# Patient Record
Sex: Male | Born: 1975
Health system: Southern US, Community
[De-identification: ages and names within clinical notes are randomized; demographics above are authoritative.]

## PROBLEM LIST (undated history)

## (undated) ENCOUNTER — Emergency Department (HOSPITAL_COMMUNITY): Discharge status: Absent without leave | Payer: MEDICAID

## (undated) ENCOUNTER — Emergency Department (HOSPITAL_COMMUNITY): Admission: EM | Payer: Medicare Other

## (undated) ENCOUNTER — Emergency Department (HOSPITAL_COMMUNITY): Payer: Medicaid Other

## (undated) ENCOUNTER — Emergency Department (HOSPITAL_COMMUNITY): Admission: EM | Payer: Medicaid Other | Source: Home / Self Care

## (undated) DIAGNOSIS — N189 Chronic kidney disease, unspecified: Secondary | ICD-10-CM

## (undated) DIAGNOSIS — D649 Anemia, unspecified: Secondary | ICD-10-CM

## (undated) DIAGNOSIS — I82409 Acute embolism and thrombosis of unspecified deep veins of unspecified lower extremity: Secondary | ICD-10-CM

## (undated) DIAGNOSIS — M21371 Foot drop, right foot: Secondary | ICD-10-CM

## (undated) DIAGNOSIS — Z932 Ileostomy status: Secondary | ICD-10-CM

## (undated) DIAGNOSIS — G8921 Chronic pain due to trauma: Secondary | ICD-10-CM

## (undated) DIAGNOSIS — Z87442 Personal history of urinary calculi: Secondary | ICD-10-CM

## (undated) DIAGNOSIS — W3400XA Accidental discharge from unspecified firearms or gun, initial encounter: Secondary | ICD-10-CM

## (undated) HISTORY — DX: Foot drop, right foot: M21.371

## (undated) HISTORY — DX: Ileostomy status: Z93.2

## (undated) HISTORY — DX: Chronic pain due to trauma: G89.21

---

## 2002-03-06 ENCOUNTER — Emergency Department (HOSPITAL_COMMUNITY): Admission: EM | Admit: 2002-03-06 | Discharge: 2002-03-06 | Payer: Self-pay | Admitting: Emergency Medicine

## 2004-07-11 ENCOUNTER — Emergency Department (HOSPITAL_COMMUNITY): Admission: EM | Admit: 2004-07-11 | Discharge: 2004-07-11 | Payer: Self-pay | Admitting: Emergency Medicine

## 2005-06-10 ENCOUNTER — Emergency Department (HOSPITAL_COMMUNITY): Admission: EM | Admit: 2005-06-10 | Discharge: 2005-06-10 | Payer: Self-pay | Admitting: Emergency Medicine

## 2005-08-05 ENCOUNTER — Emergency Department (HOSPITAL_COMMUNITY): Admission: EM | Admit: 2005-08-05 | Discharge: 2005-08-05 | Payer: Self-pay | Admitting: Emergency Medicine

## 2005-09-16 ENCOUNTER — Ambulatory Visit (HOSPITAL_COMMUNITY): Admission: RE | Admit: 2005-09-16 | Discharge: 2005-09-16 | Payer: Self-pay | Admitting: General Surgery

## 2005-09-16 ENCOUNTER — Encounter (INDEPENDENT_AMBULATORY_CARE_PROVIDER_SITE_OTHER): Payer: Self-pay | Admitting: *Deleted

## 2007-04-11 ENCOUNTER — Emergency Department (HOSPITAL_COMMUNITY): Admission: EM | Admit: 2007-04-11 | Discharge: 2007-04-12 | Payer: Self-pay | Admitting: Emergency Medicine

## 2008-11-29 ENCOUNTER — Emergency Department (HOSPITAL_COMMUNITY): Admission: EM | Admit: 2008-11-29 | Discharge: 2008-11-29 | Payer: Self-pay | Admitting: Emergency Medicine

## 2009-02-28 ENCOUNTER — Emergency Department (HOSPITAL_COMMUNITY): Admission: EM | Admit: 2009-02-28 | Discharge: 2009-02-28 | Payer: Self-pay | Admitting: Emergency Medicine

## 2010-06-29 ENCOUNTER — Emergency Department (HOSPITAL_COMMUNITY)
Admission: EM | Admit: 2010-06-29 | Discharge: 2010-06-29 | Payer: Self-pay | Source: Home / Self Care | Admitting: Family Medicine

## 2010-10-27 LAB — RAPID STREP SCREEN (MED CTR MEBANE ONLY): Streptococcus, Group A Screen (Direct): NEGATIVE

## 2010-10-30 LAB — URINALYSIS, ROUTINE W REFLEX MICROSCOPIC
Bilirubin Urine: NEGATIVE
Glucose, UA: NEGATIVE mg/dL
Hgb urine dipstick: NEGATIVE
Ketones, ur: NEGATIVE mg/dL
Nitrite: NEGATIVE
Protein, ur: NEGATIVE mg/dL
Specific Gravity, Urine: 1.009 (ref 1.005–1.030)
Urobilinogen, UA: 1 mg/dL (ref 0.0–1.0)
pH: 6.5 (ref 5.0–8.0)

## 2010-12-07 NOTE — Op Note (Signed)
Raymond Bowen, Raymond Bowen                ACCOUNT NO.:  [id number]   MEDICAL RECORD NO.:  [id number]          PATIENT TYPE:  AMB   LOCATION:  DAY                          FACILITY:  [facility]   PHYSICIAN:  Ollen Gross. Vernell Morgans, M.D. DATE OF BIRTH:  1975/07/30   DATE OF PROCEDURE:  09/16/2005  DATE OF DISCHARGE:                                 OPERATIVE REPORT   PREOPERATIVE DIAGNOSIS:  Lipoma on the posterior neck.   POSTOPERATIVE DIAGNOSIS:  Lipoma on the posterior neck.   PROCEDURE:  Excision of lipoma from the posterior neck.   SURGEON:  Ollen Gross. Carolynne Edouard, M.D.   ANESTHESIA:  General endotracheal.   PROCEDURE:  After informed consent was obtained, the patient was brought to  the operating room and left in the supine position on the stretcher.  After  adequate induction of general anesthesia, the patient was moved into a prone  position on the operating room table, and all pressure points were padded.  The posterior neck had a moderate-sized lipoma that was symptomatic.  This  area was prepped with Hibiclens and draped in the usual sterile manner.  The  area in question was then infiltrated with 0.25% Marcaine with epinephrine.  A small transverse incision was made overlying the palpable mass.  This  incision was carried down through the skin and into the subcutaneous tissue  sharply with a 15 blade knife.  The lipoma was then excised from the rest of  the subcutaneous tissue using a combination of blunt hemostat dissection and  sharp dissection with the electrocautery.  Once the lipoma had been  completely removed by this circumferential dissection, he was sent to  pathology for further evaluation.  The wound was then examined, and  hemostasis was achieved using the electrocautery.  No other evidence of  lipomatous tissue was appreciable.  The wound was then closed with  interrupted 4-0 Monocryl subcuticular stitches and Dermabond dressing was  applied.  The patient tolerated the procedure well.   At the end of the case,  all needle and sponge counts were correct.  Patient was then moved back into  a supine position on the stretcher, awakened, and taken to the recovery room  in stable condition.      Ollen Gross. Vernell Morgans, M.D.  Electronically Signed     PST/MEDQ  D:  09/16/2005  T:  09/16/2005  Job:  [id number]

## 2012-03-17 ENCOUNTER — Emergency Department (HOSPITAL_COMMUNITY)
Admission: EM | Admit: 2012-03-17 | Discharge: 2012-03-17 | Disposition: A | Discharge status: Home / Self Care | Payer: Self-pay | Attending: Emergency Medicine | Admitting: Emergency Medicine

## 2012-03-17 ENCOUNTER — Encounter (HOSPITAL_COMMUNITY): Payer: Self-pay | Admitting: Family Medicine

## 2012-03-17 DIAGNOSIS — K047 Periapical abscess without sinus: Secondary | ICD-10-CM | POA: Insufficient documentation

## 2012-03-17 DIAGNOSIS — F17200 Nicotine dependence, unspecified, uncomplicated: Secondary | ICD-10-CM | POA: Insufficient documentation

## 2012-03-17 MED ORDER — OXYCODONE-ACETAMINOPHEN 5-325 MG PO TABS: 1.0000 | ORAL_TABLET | ORAL | Status: AC | PRN

## 2012-03-17 MED ORDER — CLINDAMYCIN HCL 150 MG PO CAPS: 450.0000 mg | ORAL_CAPSULE | Freq: Once | ORAL | Status: DC

## 2012-03-17 MED ORDER — OXYCODONE-ACETAMINOPHEN 5-325 MG PO TABS
1.0000 | ORAL_TABLET | Freq: Once | ORAL | Status: AC
  Administered 2012-03-17: 1 via ORAL
  Filled 2012-03-17: qty 1

## 2012-03-17 MED ORDER — CLINDAMYCIN HCL 300 MG PO CAPS
450.0000 mg | ORAL_CAPSULE | Freq: Once | ORAL | Status: AC
  Administered 2012-03-17: 450 mg via ORAL
  Filled 2012-03-17: qty 1

## 2012-03-17 MED ORDER — CLINDAMYCIN HCL 300 MG PO CAPS: 300.0000 mg | ORAL_CAPSULE | Freq: Three times a day (TID) | ORAL | Status: AC

## 2012-03-17 MED ORDER — OXYCODONE-ACETAMINOPHEN 5-325 MG PO TABS: 1.0000 | ORAL_TABLET | ORAL | Status: DC | PRN

## 2012-03-17 NOTE — ED Notes (Signed)
Pt. Reports taking "one percocet at 7:20 this morning".

## 2012-03-17 NOTE — ED Notes (Signed)
Pt. C/o facial swelling on right side.  Pt. C/o tooth pain x2 days and reports "spitting up blood this morning".  Pt. Reports pain level as 10/10.  Alert and oriented x4.
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
## 2012-03-19 NOTE — ED Provider Notes (Signed)
I saw and evaluated the patient, reviewed the resident's note and I agree with the findings and plan.  Cyndra Numbers, MD 03/19/12 [phone]

## 2013-03-22 ENCOUNTER — Emergency Department (HOSPITAL_COMMUNITY): Payer: Self-pay

## 2013-03-22 ENCOUNTER — Encounter (HOSPITAL_COMMUNITY): Payer: Self-pay | Admitting: Radiology

## 2013-03-22 ENCOUNTER — Emergency Department (HOSPITAL_COMMUNITY)
Admission: EM | Admit: 2013-03-22 | Discharge: 2013-03-22 | Disposition: A | Discharge status: Home / Self Care | Payer: Self-pay | Attending: Emergency Medicine | Admitting: Emergency Medicine

## 2013-03-22 DIAGNOSIS — S022XXA Fracture of nasal bones, initial encounter for closed fracture: Secondary | ICD-10-CM | POA: Insufficient documentation

## 2013-03-22 DIAGNOSIS — S02109A Fracture of base of skull, unspecified side, initial encounter for closed fracture: Secondary | ICD-10-CM | POA: Insufficient documentation

## 2013-03-22 DIAGNOSIS — S01409A Unspecified open wound of unspecified cheek and temporomandibular area, initial encounter: Secondary | ICD-10-CM | POA: Insufficient documentation

## 2013-03-22 DIAGNOSIS — S0292XA Unspecified fracture of facial bones, initial encounter for closed fracture: Secondary | ICD-10-CM

## 2013-03-22 DIAGNOSIS — S01501A Unspecified open wound of lip, initial encounter: Secondary | ICD-10-CM | POA: Insufficient documentation

## 2013-03-22 DIAGNOSIS — F17200 Nicotine dependence, unspecified, uncomplicated: Secondary | ICD-10-CM | POA: Insufficient documentation

## 2013-03-22 DIAGNOSIS — F1010 Alcohol abuse, uncomplicated: Secondary | ICD-10-CM | POA: Insufficient documentation

## 2013-03-22 DIAGNOSIS — R4182 Altered mental status, unspecified: Secondary | ICD-10-CM | POA: Insufficient documentation

## 2013-03-22 DIAGNOSIS — F10929 Alcohol use, unspecified with intoxication, unspecified: Secondary | ICD-10-CM

## 2013-03-22 LAB — COMPREHENSIVE METABOLIC PANEL
ALT: 15 U/L (ref 0–53)
AST: 31 U/L (ref 0–37)
Albumin: 4 g/dL (ref 3.5–5.2)
Alkaline Phosphatase: 72 U/L (ref 39–117)
BUN: 14 mg/dL (ref 6–23)
CO2: 20 mEq/L (ref 19–32)
Calcium: 9.2 mg/dL (ref 8.4–10.5)
Chloride: 103 mEq/L (ref 96–112)
Creatinine, Ser: 1.32 mg/dL (ref 0.50–1.35)
GFR calc Af Amer: 80 mL/min — ABNORMAL LOW (ref >90)
GFR calc non Af Amer: 69 mL/min — ABNORMAL LOW (ref >90)
Glucose, Bld: 122 mg/dL — ABNORMAL HIGH (ref 70–99)
Potassium: 3.5 mEq/L (ref 3.5–5.1)
Sodium: 140 mEq/L (ref 135–145)
Total Bilirubin: 0.4 mg/dL (ref 0.3–1.2)
Total Protein: 8 g/dL (ref 6.0–8.3)

## 2013-03-22 LAB — POCT I-STAT, CHEM 8
BUN: 13 mg/dL (ref 6–23)
Calcium, Ion: 1.06 mmol/L — ABNORMAL LOW (ref 1.12–1.23)
Chloride: 110 mEq/L (ref 96–112)
Creatinine, Ser: 1.7 mg/dL — ABNORMAL HIGH (ref 0.50–1.35)
Glucose, Bld: 119 mg/dL — ABNORMAL HIGH (ref 70–99)
HCT: 44 % (ref 39.0–52.0)
Hemoglobin: 15 g/dL (ref 13.0–17.0)
Potassium: 3.7 mEq/L (ref 3.5–5.1)
Sodium: 145 mEq/L (ref 135–145)
TCO2: 20 mmol/L (ref 0–100)

## 2013-03-22 LAB — CBC WITH DIFFERENTIAL/PLATELET
Basophils Absolute: 0 10*3/uL (ref 0.0–0.1)
Basophils Relative: 0 % (ref 0–1)
Eosinophils Absolute: 0.1 10*3/uL (ref 0.0–0.7)
Eosinophils Relative: 1 % (ref 0–5)
HCT: 40 % (ref 39.0–52.0)
Hemoglobin: 14.3 g/dL (ref 13.0–17.0)
Lymphocytes Relative: 17 % (ref 12–46)
Lymphs Abs: 2.3 10*3/uL (ref 0.7–4.0)
MCH: 34 pg (ref 26.0–34.0)
MCHC: 35.8 g/dL (ref 30.0–36.0)
MCV: 95.2 fL (ref 78.0–100.0)
Monocytes Absolute: 1.1 10*3/uL — ABNORMAL HIGH (ref 0.1–1.0)
Monocytes Relative: 8 % (ref 3–12)
Neutro Abs: 10.1 10*3/uL — ABNORMAL HIGH (ref 1.7–7.7)
Neutrophils Relative %: 74 % (ref 43–77)
Platelets: 227 10*3/uL (ref 150–400)
RBC: 4.2 MIL/uL — ABNORMAL LOW (ref 4.22–5.81)
RDW: 14.6 % (ref 11.5–15.5)
WBC: 13.6 10*3/uL — ABNORMAL HIGH (ref 4.0–10.5)

## 2013-03-22 LAB — ETHANOL: Alcohol, Ethyl (B): 177 mg/dL — ABNORMAL HIGH (ref 0–11)

## 2013-03-22 LAB — GLUCOSE, CAPILLARY: Glucose-Capillary: 117 mg/dL — ABNORMAL HIGH (ref 70–99)

## 2013-03-22 MED ORDER — LORAZEPAM 2 MG/ML IJ SOLN
1.0000 mg | Freq: Once | INTRAMUSCULAR | Status: AC
  Administered 2013-03-22: 1 mg via INTRAVENOUS
  Filled 2013-03-22: qty 1

## 2013-03-22 MED ORDER — HYDROCODONE-ACETAMINOPHEN 5-325 MG PO TABS: 1.0000 | ORAL_TABLET | Freq: Four times a day (QID) | ORAL | Status: DC | PRN

## 2013-03-22 MED ORDER — AMPICILLIN 500 MG PO CAPS: 500.0000 mg | ORAL_CAPSULE | Freq: Four times a day (QID) | ORAL | Status: DC

## 2013-03-22 MED ORDER — HYDROMORPHONE HCL PF 1 MG/ML IJ SOLN
0.5000 mg | Freq: Once | INTRAMUSCULAR | Status: AC
  Administered 2013-03-22: 0.5 mg via INTRAVENOUS
  Filled 2013-03-22: qty 1

## 2013-03-22 MED ORDER — SODIUM CHLORIDE 0.9 % IV SOLN
Freq: Once | INTRAVENOUS | Status: AC
  Administered 2013-03-22: via INTRAVENOUS

## 2013-03-22 MED ORDER — CEFAZOLIN SODIUM 1-5 GM-% IV SOLN
1.0000 g | Freq: Three times a day (TID) | INTRAVENOUS | Status: DC
  Administered 2013-03-22: 1 g via INTRAVENOUS
  Filled 2013-03-22: qty 50

## 2013-03-22 MED ORDER — TETANUS-DIPHTH-ACELL PERTUSSIS 5-2.5-18.5 LF-MCG/0.5 IM SUSP
0.5000 mL | Freq: Once | INTRAMUSCULAR | Status: AC
  Administered 2013-03-22: 0.5 mL via INTRAMUSCULAR
  Filled 2013-03-22: qty 0.5

## 2013-03-22 MED ORDER — LORAZEPAM 2 MG/ML IJ SOLN
1.0000 mg | Freq: Once | INTRAMUSCULAR | Status: AC
  Administered 2013-03-22: 1 mg via INTRAVENOUS

## 2013-03-22 MED ORDER — ONDANSETRON HCL 4 MG/2ML IJ SOLN
4.0000 mg | Freq: Once | INTRAMUSCULAR | Status: AC
  Administered 2013-03-22: 4 mg via INTRAVENOUS

## 2013-03-22 MED ORDER — ONDANSETRON HCL 4 MG/2ML IJ SOLN
INTRAMUSCULAR | Status: AC
  Filled 2013-03-22: qty 2
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
## 2013-03-22 NOTE — ED Notes (Signed)
Pt given a cup of ice with 2 apple juices, a pair of blue scrubs and a pair of non-slid socks; pt has been d/c'd from IV, monitor, continuous pulse oximetry and blood pressure cuff; wheelchair has also been provided to take pt to waiting area to wait for his ride

## 2013-03-22 NOTE — ED Notes (Addendum)
Pt spontaneously arousable from sleep, moaning, VSS, unsafe movements over rail, against rail, towards edge of bed, not following commands, speech difficult to discern, guided back to matress, pt states, "what are you doing" and fell back to sleep, EDP aware, pain med ordered.
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
## 2013-03-22 NOTE — ED Notes (Signed)
Pt in CT scanner. Uncooperative. MAEx4, rolling towards edges of table. Dr. Deretha Emory aware.

## 2013-03-22 NOTE — ED Notes (Signed)
Pain med given, abx infusing, pt sleeping.

## 2013-03-22 NOTE — ED Notes (Signed)
[employer] present outside of room 

## 2013-03-22 NOTE — ED Notes (Signed)
No changes, pt sleeping
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
## 2013-03-22 NOTE — ED Notes (Signed)
[employer] present outside of room

## 2013-03-22 NOTE — ED Notes (Signed)
Back to sleep, pain med given, sonorous resps, VSS.

## 2013-03-22 NOTE — ED Notes (Signed)
Pt to CT

## 2013-03-22 NOTE — ED Provider Notes (Signed)
8:41 AM Patient awake and alert, no new complaints.  Vital signs remained stable.  He will be discharged.  Gerhard Munch, MD 03/22/13 [phone]

## 2013-03-22 NOTE — ED Notes (Signed)
Dr. Deretha Emory in CT scanner room.

## 2013-03-22 NOTE — ED Notes (Signed)
Ativan 1 mg given (2nd dose) given per t.o. Dr. Deretha Emory, VSS. Pt argumentative, resisting care.

## 2013-03-22 NOTE — ED Notes (Signed)
Continuing to wait for sobriety.  Pt sleeping with sonorous resps, intermitantly spontaneously arousable, VSS. c-collar remains.

## 2013-03-22 NOTE — ED Notes (Signed)
cbg 117 

## 2013-03-22 NOTE — ED Notes (Signed)
Warm blanket given, ambient room temp increased.

## 2013-03-22 NOTE — ED Notes (Signed)
Pt given PO fluids. Trying to call for ride home.

## 2013-03-22 NOTE — ED Notes (Signed)
No changes, remains sleeping/ sedated. No restlessness or aggitation at this time. Sonorous resps. VSS.
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
## 2013-03-22 NOTE — ED Notes (Signed)
Lab at [facility], blood obtained. 

## 2013-03-22 NOTE — ED Notes (Signed)
Pt sleeping when rounded on. C-collar remains in place.  Cardiac monitoring continues.  No change in pt condition.

## 2013-03-22 NOTE — ED Notes (Signed)
Scans continue, pt still at this time.

## 2013-03-22 NOTE — ED Notes (Signed)
Continues to sleep sedated, sonorous resps, VSS, no changes.

## 2013-03-22 NOTE — ED Notes (Signed)
Xray at [facility], pt remains alert, NAD, calm, interactive, resps e/u, speaking in clearly, semi cooperative, not following all commands, answers most questions appropriately. Continues to MAEx4. c-collar remains. Lying flat supine.

## 2013-03-22 NOTE — ED Notes (Signed)
MD at bedside. 

## 2013-03-22 NOTE — ED Notes (Signed)
arousable spontaneously, trying to get out of bed, re-oriented, redirected, moaning, EDP aware.

## 2013-03-22 NOTE — ED Notes (Signed)
Sl;eeping, no changes, VSS.
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
## 2013-03-23 ENCOUNTER — Emergency Department (HOSPITAL_COMMUNITY)
Admission: EM | Admit: 2013-03-23 | Discharge: 2013-03-23 | Disposition: A | Discharge status: Home / Self Care | Payer: Self-pay | Attending: Emergency Medicine | Admitting: Emergency Medicine

## 2013-03-23 ENCOUNTER — Encounter (HOSPITAL_COMMUNITY): Payer: Self-pay | Admitting: *Deleted

## 2013-03-23 DIAGNOSIS — R220 Localized swelling, mass and lump, head: Secondary | ICD-10-CM | POA: Insufficient documentation

## 2013-03-23 DIAGNOSIS — G8911 Acute pain due to trauma: Secondary | ICD-10-CM | POA: Insufficient documentation

## 2013-03-23 DIAGNOSIS — S02401A Maxillary fracture, unspecified, initial encounter for closed fracture: Secondary | ICD-10-CM

## 2013-03-23 DIAGNOSIS — S022XXD Fracture of nasal bones, subsequent encounter for fracture with routine healing: Secondary | ICD-10-CM

## 2013-03-23 DIAGNOSIS — F17200 Nicotine dependence, unspecified, uncomplicated: Secondary | ICD-10-CM | POA: Insufficient documentation

## 2013-03-23 DIAGNOSIS — R221 Localized swelling, mass and lump, neck: Secondary | ICD-10-CM | POA: Insufficient documentation
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
## 2013-03-23 NOTE — ED Notes (Signed)
Back wounds irrigated, applied bacitracin and non-adherent abd pads. Pt provided cleaning instructions and s/s infection.
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
## 2013-08-23 ENCOUNTER — Encounter (HOSPITAL_COMMUNITY): Payer: Self-pay | Admitting: Emergency Medicine

## 2013-08-23 ENCOUNTER — Emergency Department (HOSPITAL_COMMUNITY)
Admission: EM | Admit: 2013-08-23 | Discharge: 2013-08-23 | Disposition: A | Discharge status: Home / Self Care | Payer: Self-pay | Attending: Emergency Medicine | Admitting: Emergency Medicine

## 2013-08-23 DIAGNOSIS — F17200 Nicotine dependence, unspecified, uncomplicated: Secondary | ICD-10-CM | POA: Insufficient documentation

## 2013-08-23 DIAGNOSIS — N489 Disorder of penis, unspecified: Secondary | ICD-10-CM | POA: Insufficient documentation

## 2013-08-23 DIAGNOSIS — B86 Scabies: Secondary | ICD-10-CM | POA: Insufficient documentation

## 2013-08-23 DIAGNOSIS — R21 Rash and other nonspecific skin eruption: Secondary | ICD-10-CM

## 2013-08-23 MED ORDER — AZITHROMYCIN 250 MG PO TABS
1000.0000 mg | ORAL_TABLET | Freq: Once | ORAL | Status: AC
  Administered 2013-08-23: 1000 mg via ORAL
  Filled 2013-08-23: qty 4

## 2013-08-23 MED ORDER — PERMETHRIN 5 % EX CREA: TOPICAL_CREAM | CUTANEOUS | Status: DC

## 2013-08-23 MED ORDER — CEFTRIAXONE SODIUM 250 MG IJ SOLR
250.0000 mg | Freq: Once | INTRAMUSCULAR | Status: AC
  Administered 2013-08-23: 250 mg via INTRAMUSCULAR
  Filled 2013-08-23: qty 250

## 2013-08-23 MED ORDER — PENICILLIN V POTASSIUM 500 MG PO TABS: 500.0000 mg | ORAL_TABLET | Freq: Three times a day (TID) | ORAL | Status: DC

## 2013-08-23 MED ORDER — METRONIDAZOLE 500 MG PO TABS
2000.0000 mg | ORAL_TABLET | Freq: Once | ORAL | Status: AC
  Administered 2013-08-23: 2000 mg via ORAL
  Filled 2013-08-23: qty 4

## 2013-08-23 NOTE — ED Provider Notes (Signed)
CSN: [id number]     Arrival date & time 08/23/13  1228 History   None   This chart was scribed for Marlon Pel PA-C, a non-physician practitioner working with Geoffery Lyons, MD by Lewanda Rife, ED Scribe. This patient was seen in room TR06C/TR06C and the patient's care was started at 2:45 PM      Chief Complaint  Patient presents with  . Rash   (Consider location/radiation/quality/duration/timing/severity/associated sxs/prior Treatment) The history is provided by the patient. No language interpreter was used.  HPI Comments: Raymond Bowen is a 38 y.o. male who presents to the Emergency Department complaining of worsening, constant rash onset 2 weeks. Describes rash as severely pruritic, moderately painful and "all over." States the rash is worse in his groin and buttocks. Reports symptoms are exacerbated at night. Denies any alleviating factors. Denies associated fever, known allergies, change in laundry detergent, change in soaps, and change in medications.  History reviewed. No pertinent past medical history. History reviewed. No pertinent past surgical history. No family history on file. History  Substance Use Topics  . Smoking status: Current Every Day Smoker  . Smokeless tobacco: Not on file  . Alcohol Use: Yes    Review of Systems  Constitutional: Negative for fatigue.  Skin: Positive for rash.  Psychiatric/Behavioral: Negative for confusion.    Allergies  Shellfish allergy  Home Medications   Current Outpatient Rx  Name  Route  Sig  Dispense  Refill  . permethrin (ELIMITE) 5 % cream      Apply to affected area once   60 g   1    BP 156/79  Pulse 74  Temp(Src) 98.2 F (36.8 C)  Resp 16  SpO2 97% Physical Exam  Nursing note and vitals reviewed. Constitutional: He is oriented to person, place, and time. He appears well-developed and well-nourished. No distress.  HENT:  Head: Normocephalic and atraumatic.  Eyes: EOM are normal.  Neck: Neck supple. No  tracheal deviation present.  Cardiovascular: Normal rate.   Pulmonary/Chest: Effort normal. No respiratory distress.  Genitourinary:  excoriation and rash to penis.  There is no drainage, gumma, chancre   Musculoskeletal: Normal range of motion.  Neurological: He is alert and oriented to person, place, and time.  Skin: Skin is warm and dry. Rash noted.  Psychiatric: He has a normal mood and affect. His behavior is normal.    ED Course  Procedures COORDINATION OF CARE:  Nursing notes reviewed. Vital signs reviewed. Initial pt interview and examination performed.   2:50 PM-Discussed treatment plan with pt at bedside. Pt agrees with plan.   Treatment plan initiated: Medications  azithromycin (ZITHROMAX) tablet 1,000 mg (not administered)  cefTRIAXone (ROCEPHIN) injection 250 mg (not administered)  metroNIDAZOLE (FLAGYL) tablet 2,000 mg (not administered)     Initial diagnostic testing ordered.     Labs Review Labs Reviewed - No data to display Imaging Review No results found.  EKG Interpretation   None       MDM   1. Scabies   2. Penile rash    Symptoms do not seem consistent with STD, however, patient has rash to penis which is most likely the scabies that has been scratched and is secondarily infected.  Pt told to come back if treatments don't help.  Given in ED, Azithromycin, Rocephin and Flagyl. Rx: Promethrin  37 y.o.[name]'s evaluation in the Emergency Department is complete. It has been determined that no acute conditions requiring further emergency intervention are present at this  time. The patient/guardian have been advised of the diagnosis and plan. We have discussed signs and symptoms that warrant return to the ED, such as changes or worsening in symptoms.  Vital signs are stable at discharge. Filed Vitals:   08/23/13 1256  BP: 156/79  Pulse: 74  Temp: 98.2 F (36.8 C)  Resp: 16    Patient/guardian has voiced understanding and agreed  to follow-up with the PCP or specialist.  I personally performed the services described in this documentation, which was scribed in my presence. The recorded information has been reviewed and is accurate. Dorthula Matas.    Ronan Duecker G Paige Monarrez, PA-C 08/23/13 1504

## 2013-08-23 NOTE — Discharge Instructions (Signed)
Scabies Scabies are small bugs (mites) that burrow under the skin and cause red bumps and severe itching. These bugs can only be seen with a microscope. Scabies are highly contagious. They can spread easily from person to person by direct contact. They are also spread through sharing clothing or linens that have the scabies mites living in them. It is not unusual for an entire family to become infected through shared towels, clothing, or bedding.  HOME CARE INSTRUCTIONS   Your caregiver may prescribe a cream or lotion to kill the mites. If cream is prescribed, massage the cream into the entire body from the neck to the bottom of both feet. Also massage the cream into the scalp and face if your child is less than 36 year old. Avoid the eyes and mouth. Do not wash your hands after application.  Leave the cream on for 8 to 12 hours. Your child should bathe or shower after the 8 to 12 hour application period. Sometimes it is helpful to apply the cream to your child right before bedtime.  One treatment is usually effective and will eliminate approximately 95% of infestations. For severe cases, your caregiver may decide to repeat the treatment in 1 week. Everyone in your household should be treated with one application of the cream.  New rashes or burrows should not appear within 24 to 48 hours after successful treatment. However, the itching and rash may last for 2 to 4 weeks after successful treatment. Your caregiver may prescribe a medicine to help with the itching or to help the rash go away more quickly.  Scabies can live on clothing or linens for up to 3 days. All of your child's recently used clothing, towels, stuffed toys, and bed linens should be washed in hot water and then dried in a dryer for at least 20 minutes on high heat. Items that cannot be washed should be enclosed in a plastic bag for at least 3 days.  To help relieve itching, bathe your child in a cool bath or apply cool washcloths to the  affected areas.  Your child may return to school after treatment with the prescribed cream. SEEK MEDICAL CARE IF:   The itching persists longer than 4 weeks after treatment.  The rash spreads or becomes infected. Signs of infection include red blisters or yellow-tan crust. Document Released: 07/08/2005 Document Revised: 09/30/2011 Document Reviewed: 11/16/2008 [facility] Patient Information 2014 [place], [place]. Sexually Transmitted Disease A sexually transmitted disease (STD) is a disease or infection that may be passed (transmitted) from person to person, usually during sexual activity. This may happen by way of saliva, semen, blood, vaginal mucus, or urine. Common STDs include:   Gonorrhea.   Chlamydia.   Syphilis.   HIV and AIDS.   Genital herpes.   Hepatitis B and C.   Trichomonas.   Human papillomavirus (HPV).   Pubic lice.   Scabies.  Mites.  Bacterial vaginosis. WHAT ARE CAUSES OF STDs? An STD may be caused by bacteria, a virus, or parasites. STDs are often transmitted during sexual activity if one person is infected. However, they may also be transmitted through nonsexual means. STDs may be transmitted after:   Sexual intercourse with an infected person.   Sharing sex toys with an infected person.   Sharing needles with an infected person or using unclean piercing or tattoo needles.  Having intimate contact with the genitals, mouth, or rectal areas of an infected person.   Exposure to infected fluids during birth. WHAT  ARE THE SIGNS AND SYMPTOMS OF STDs? Different STDs have different symptoms. Some people may not have any symptoms. If symptoms are present, they may include:   Painful or bloody urination.   Pain in the pelvis, abdomen, vagina, anus, throat, or eyes.   Skin rash, itching, irritation, growths, sores (lesions), ulcerations, or warts in the genital or anal area.  Abnormal vaginal discharge with or without bad odor.   Penile  discharge in men.   Fever.   Pain or bleeding during sexual intercourse.   Swollen glands in the groin area.   Yellow skin and eyes (jaundice). This is seen with hepatitis.   Swollen testicles.  Infertility.  Sores and blisters in the mouth. HOW ARE STDs DIAGNOSED? To make a diagnosis, your health care provider may:   Take a medical history.   Perform a physical exam.   Take a sample of any discharge for examination.  Swab the throat, cervix, opening to the penis, rectum, or vagina for testing.  Test a sample of your first morning urine.   Perform blood tests.   Perform a Pap smear, if this applies.   Perform a colposcopy.   Perform a laparoscopy.  HOW ARE STDs TREATED? Treatment depends on the STD. Some STDs may be treated but not cured.   Chlamydia, gonorrhea, trichomonas, and syphilis can be cured with antibiotics.   Genital herpes, hepatitis, and HIV can be treated, but not cured, with prescribed medicines. The medicines lessen symptoms.   Genital warts from HPV can be treated with medicine or by freezing, burning (electrocautery), or surgery. Warts may come back.   HPV cannot be cured with medicine or surgery. However, abnormal areas may be removed from the cervix, vagina, or vulva.   If your diagnosis is confirmed, your recent sexual partners need treatment. This is true even if they are symptom-free or have a negative culture or evaluation. They should not have sex until their health care providers say it is OK. HOW CAN I REDUCE MY RISK OF GETTING AN STD?  Use latex condoms, dental dams, and water-soluble lubricants during sexual activity. Do not use petroleum jelly or oils.  Get vaccinated for HPV and hepatitis. If you have not received these vaccines in the past, talk to your health care provider about whether one or both might be right for you.   Avoid risky sex practices that can break the skin.  WHAT SHOULD I DO IF I THINK I HAVE AN  STD?  See your health care provider.   Inform all sexual partners. They should be tested and treated for any STDs.  Do not have sex until your health care provider says it is OK. WHEN SHOULD I GET HELP? Seek immediate medical care if:  You develop severe abdominal pain.  You are a man and notice swelling or pain in the testicles.  You are a woman and notice swelling or pain in your vagina. Document Released: 09/28/2002 Document Revised: 04/28/2013 Document Reviewed: 01/26/2013 [facility] Patient Information 2014 [place], [place].

## 2013-08-23 NOTE — ED Notes (Signed)
Pt c/o rash across body x 2 weeks. States painful and itching.

## 2013-08-23 NOTE — ED Provider Notes (Signed)
Medical screening examination/treatment/procedure(s) were performed by non-physician practitioner and as supervising physician I was immediately available for consultation/collaboration.     Kemiah Booz, MD 08/23/13 1530 

## 2013-08-26 ENCOUNTER — Telehealth (HOSPITAL_COMMUNITY): Payer: Self-pay

## 2014-04-23 ENCOUNTER — Emergency Department (HOSPITAL_COMMUNITY): Admission: EM | Admit: 2014-04-23 | Discharge: 2014-04-23 | Discharge status: Against Medical Advice | Payer: Self-pay
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
## 2014-04-23 NOTE — ED Notes (Signed)
Called for triage x 2, no response. 

## 2014-04-24 ENCOUNTER — Emergency Department (INDEPENDENT_AMBULATORY_CARE_PROVIDER_SITE_OTHER): Payer: Self-pay

## 2014-04-24 ENCOUNTER — Encounter (HOSPITAL_COMMUNITY): Payer: Self-pay | Admitting: Emergency Medicine

## 2014-04-24 ENCOUNTER — Emergency Department (INDEPENDENT_AMBULATORY_CARE_PROVIDER_SITE_OTHER)
Admission: EM | Admit: 2014-04-24 | Discharge: 2014-04-24 | Disposition: A | Discharge status: Home / Self Care | Payer: Self-pay | Source: Home / Self Care

## 2014-04-24 DIAGNOSIS — T148XXA Other injury of unspecified body region, initial encounter: Secondary | ICD-10-CM

## 2014-04-24 DIAGNOSIS — T148 Other injury of unspecified body region: Secondary | ICD-10-CM

## 2014-04-24 MED ORDER — KETOROLAC TROMETHAMINE 30 MG/ML IJ SOLN: 30.0000 mg | Freq: Once | INTRAMUSCULAR | Status: DC

## 2014-04-24 MED ORDER — KETOROLAC TROMETHAMINE 30 MG/ML IJ SOLN
INTRAMUSCULAR | Status: AC
  Filled 2014-04-24: qty 1

## 2014-04-24 MED ORDER — KETOROLAC TROMETHAMINE 30 MG/ML IJ SOLN
30.0000 mg | Freq: Once | INTRAMUSCULAR | Status: AC
  Administered 2014-04-24: 30 mg via INTRAMUSCULAR
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
## 2014-04-24 NOTE — Discharge Instructions (Signed)
Contusion °A contusion is a deep bruise. Contusions are the result of an injury that caused bleeding under the skin. The contusion may turn blue, purple, or yellow. Minor injuries will give you a painless contusion, but more severe contusions may stay painful and swollen for a few weeks.  °CAUSES  °A contusion is usually caused by a blow, trauma, or direct force to an area of the body. °SYMPTOMS  °· Swelling and redness of the injured area. °· Bruising of the injured area. °· Tenderness and soreness of the injured area. °· Pain. °DIAGNOSIS  °The diagnosis can be made by taking a history and physical exam. An X-ray, CT scan, or MRI may be needed to determine if there were any associated injuries, such as fractures. °TREATMENT  °Specific treatment will depend on what area of the body was injured. In general, the best treatment for a contusion is resting, icing, elevating, and applying cold compresses to the injured area. Over-the-counter medicines may also be recommended for pain control. Ask your caregiver what the best treatment is for your contusion. °HOME CARE INSTRUCTIONS  °· Put ice on the injured area. °¨ Put ice in a plastic bag. °¨ Place a towel between your skin and the bag. °¨ Leave the ice on for 15-20 minutes, 3-4 times a day, or as directed by your health care provider. °· Only take over-the-counter or prescription medicines for pain, discomfort, or fever as directed by your caregiver. Your caregiver may recommend avoiding anti-inflammatory medicines (aspirin, ibuprofen, and naproxen) for 48 hours because these medicines may increase bruising. °· Rest the injured area. °· If possible, elevate the injured area to reduce swelling. °SEEK IMMEDIATE MEDICAL CARE IF:  °· You have increased bruising or swelling. °· You have pain that is getting worse. °· Your swelling or pain is not relieved with medicines. °MAKE SURE YOU:  °· Understand these instructions. °· Will watch your condition. °· Will get help right  away if you are not doing well or get worse. °Document Released: 04/17/2005 Document Revised: 07/13/2013 Document Reviewed: 05/13/2011 °ExitCare® Patient Information ©2015 [employer]. This information is not intended to replace advice given to you by your health care provider. Make sure you discuss any questions you have with your health care provider. ° °

## 2014-04-24 NOTE — ED Notes (Signed)
Buddy tape 4th and 5th toes with gauze between.
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
## 2014-04-26 NOTE — ED Provider Notes (Signed)
Medical screening examination/treatment/procedure(s) were performed by resident physician or non-physician practitioner and as supervising physician I was immediately available for consultation/collaboration.   Barkley BrunsKINDL,Pierina Schuknecht DOUGLAS MD.   Linna HoffJames D Azriella Mattia, MD 04/26/14 [phone]

## 2014-08-19 ENCOUNTER — Emergency Department (HOSPITAL_COMMUNITY)
Admission: EM | Admit: 2014-08-19 | Discharge: 2014-08-19 | Disposition: A | Discharge status: Home / Self Care | Payer: Self-pay | Attending: Emergency Medicine | Admitting: Emergency Medicine

## 2014-08-19 ENCOUNTER — Encounter (HOSPITAL_COMMUNITY): Payer: Self-pay | Admitting: *Deleted

## 2014-08-19 DIAGNOSIS — R6884 Jaw pain: Secondary | ICD-10-CM | POA: Insufficient documentation

## 2014-08-19 DIAGNOSIS — Z720 Tobacco use: Secondary | ICD-10-CM | POA: Insufficient documentation

## 2014-08-19 DIAGNOSIS — K0889 Other specified disorders of teeth and supporting structures: Secondary | ICD-10-CM

## 2014-08-19 DIAGNOSIS — Z79899 Other long term (current) drug therapy: Secondary | ICD-10-CM | POA: Insufficient documentation

## 2014-08-19 DIAGNOSIS — K088 Other specified disorders of teeth and supporting structures: Secondary | ICD-10-CM | POA: Insufficient documentation

## 2014-08-19 MED ORDER — PENICILLIN V POTASSIUM 250 MG PO TABS: 500.0000 mg | ORAL_TABLET | Freq: Three times a day (TID) | ORAL | Status: DC

## 2014-08-19 MED ORDER — OXYCODONE-ACETAMINOPHEN 5-325 MG PO TABS: 1.0000 | ORAL_TABLET | Freq: Four times a day (QID) | ORAL | Status: DC | PRN

## 2014-08-19 NOTE — ED Provider Notes (Signed)
CSN: [id number]     Arrival date & time 08/19/14  [id number] History   First MD Initiated Contact with Patient 08/19/14 [phone]     Chief Complaint  Patient presents with  . Dental Pain  . Jaw Pain     (Consider location/radiation/quality/duration/timing/severity/associated sxs/prior Treatment) Patient is a 39 y.o. male presenting with tooth pain. The history is provided by the patient.  Dental Pain Location:  Upper Associated symptoms: no facial swelling, no fever and no oral lesions    patient has had pain in his left upper jaw for the last few days. Worse with eating and was cold food. It'll sometimes spread to the left lower jaw and right upper jaw. No fevers. No fevers. He does not have a dentist. No chest pain. No trouble breathing.  History reviewed. No pertinent past medical history. History reviewed. No pertinent past surgical history. No family history on file. History  Substance Use Topics  . Smoking status: Current Every Day Smoker -- 1.00 packs/day    Types: Cigarettes  . Smokeless tobacco: Not on file  . Alcohol Use: No    Review of Systems  Constitutional: Negative for fever.  HENT: Positive for dental problem. Negative for ear discharge, facial swelling, mouth sores, sinus pressure, sore throat and tinnitus.   Respiratory: Negative for shortness of breath.       Allergies  Shellfish allergy  Home Medications   Prior to Admission medications   Medication Sig Start Date End Date Taking? Authorizing Provider  oxyCODONE-acetaminophen (PERCOCET/ROXICET) 5-325 MG per tablet Take 1-2 tablets by mouth every 6 (six) hours as needed for severe pain. 08/19/14   Juliet RudeNathan R. Cariann Kinnamon, MD  penicillin v potassium (VEETID) 250 MG tablet Take 2 tablets (500 mg total) by mouth 3 (three) times daily. 08/19/14   Juliet RudeNathan R. Rubin PayorPickering, MD  permethrin (ELIMITE) 5 % cream Apply to affected area once 08/23/13   Dorthula Matasiffany G Greene, PA-C   BP 152/83 mmHg  Pulse 73  Temp(Src) 98.8 F (37.1 C)  (Oral)  Resp 14  SpO2 100% Physical Exam  Constitutional: He appears well-developed.  HENT:  No tenderness over sinuses. Some gingivitis. No tenderness over upper or lower teeth. No swelling of roof of mouth or gum area.  Neck: Neck supple.  Cardiovascular: Normal rate and regular rhythm.     ED Course  Procedures (including critical care time) Labs Review Labs Reviewed - No data to display  Imaging Review No results found.   EKG Interpretation None      MDM   Final diagnoses:  Pain, dental    Patient with dental pain. Pain over his left upper jaw. Overall he has rather good dentition. Will treat with antibiotics and pain medicine and have follow-up with a dentist.    Juliet RudeNathan R. Rubin PayorPickering, MD 08/19/14 [phone]

## 2014-08-19 NOTE — Discharge Instructions (Signed)
Take percocet for breakthrough pain, do not drink alcohol, drive, care for children or do other critical tasks while taking percocet. ° °Return to the emergency room for fever, change in vision, redness to the face that rapidly spreads towards the eye, nausea or vomiting, difficulty swallowing or shortness of breath. °  °Apply warm compresses to jaw throughout the day.  ° ° Take your antibiotics as directed and to the end of the course.  ° °Followup with a dentist is very important for ongoing evaluation and management of recurrent dental pain. Return to emergency department for emergent changing or worsening symptoms." ° °Low-cost dental clinic: °**David  Civils  at [phone]**  °**Janna Civils at [phone] [address]**   ° °You may also call [phone] ° °Dental Assistance °If the dentist on-call cannot see you, please use the resources below: ° ° °Patients with Medicaid: [facility] °[address], [phone] °[address], [phone] ° °If unable to pay, or uninsured, contact [facility] ([phone]) or [name] ([phone] in [place], [phone] in [place]) to become qualified for the adult dental clinic ° °Other Low-Cost Community Dental Services: °[facility]- [address], [place], [place], [address] °   [phone], Ext. [phone] °   2nd and 4th Thursday of the month at 6:30am °   10 clients each day by appointment, can sometimes see walk-in     patients if someone does not show for an appointment °[facility]- [address], [place], [place], [address] °   [phone] °[facility]- [address], [place], [place], [address] °   [phone] ° °[facility]- [phone] °[facility]- [phone] °[facility]- [phone] ° °

## 2014-08-19 NOTE — ED Notes (Signed)
Pt reports lower jaw/dental pain x3 weeks. Progressively worsening.

## 2014-11-16 ENCOUNTER — Encounter (HOSPITAL_BASED_OUTPATIENT_CLINIC_OR_DEPARTMENT_OTHER): Payer: Self-pay | Admitting: *Deleted

## 2014-11-16 ENCOUNTER — Emergency Department (HOSPITAL_BASED_OUTPATIENT_CLINIC_OR_DEPARTMENT_OTHER)
Admission: EM | Admit: 2014-11-16 | Discharge: 2014-11-16 | Disposition: A | Discharge status: Home / Self Care | Payer: Self-pay | Attending: Emergency Medicine | Admitting: Emergency Medicine

## 2014-11-16 DIAGNOSIS — K029 Dental caries, unspecified: Secondary | ICD-10-CM | POA: Insufficient documentation

## 2014-11-16 DIAGNOSIS — R000 Tachycardia, unspecified: Secondary | ICD-10-CM | POA: Insufficient documentation

## 2014-11-16 DIAGNOSIS — Z720 Tobacco use: Secondary | ICD-10-CM | POA: Insufficient documentation

## 2014-11-16 MED ORDER — PENICILLIN V POTASSIUM 250 MG PO TABS: 250.0000 mg | ORAL_TABLET | Freq: Four times a day (QID) | ORAL | Status: AC

## 2014-11-16 MED ORDER — HYDROCODONE-ACETAMINOPHEN 5-325 MG PO TABS: 1.0000 | ORAL_TABLET | Freq: Four times a day (QID) | ORAL | Status: DC | PRN

## 2014-11-16 MED ORDER — HYDROCODONE-ACETAMINOPHEN 5-325 MG PO TABS
1.0000 | ORAL_TABLET | Freq: Once | ORAL | Status: AC
  Administered 2014-11-16: 1 via ORAL
  Filled 2014-11-16: qty 1

## 2014-11-16 NOTE — ED Notes (Signed)
Pt c/o dental pain x 2 days.  

## 2014-11-16 NOTE — ED Provider Notes (Addendum)
CSN: [id number]     Arrival date & time 11/16/14  1647 History   First MD Initiated Contact with Patient 11/16/14 1702     Chief Complaint  Patient presents with  . Dental Pain     (Consider location/radiation/quality/duration/timing/severity/associated sxs/prior Treatment) Patient is a 39 y.o. male presenting with tooth pain. The history is provided by the patient.  Dental Pain Location:  Upper Upper teeth location:  16/LU 3rd molar Quality:  Sharp and pulsating Severity:  Severe Onset quality:  Gradual Duration:  3 days Timing:  Constant Progression:  Worsening Chronicity:  New Context: dental caries   Relieved by:  Nothing Worsened by:  Hot food/drink, touching and cold food/drink Ineffective treatments:  NSAIDs Associated symptoms: facial pain, facial swelling and gum swelling   Associated symptoms: no difficulty swallowing and no drooling   Risk factors: periodontal disease and smoking     History reviewed. No pertinent past medical history. History reviewed. No pertinent past surgical history. History reviewed. No pertinent family history. History  Substance Use Topics  . Smoking status: Current Every Day Smoker -- 1.00 packs/day    Types: Cigarettes  . Smokeless tobacco: Not on file  . Alcohol Use: No    Review of Systems  HENT: Positive for facial swelling. Negative for drooling.   All other systems reviewed and are negative.     Allergies  Shellfish allergy  Home Medications   Prior to Admission medications   Not on File   BP 149/91 mmHg  Pulse 112  Temp(Src) 99.4 F (37.4 C)  Resp 16  Ht 5\' 8"  (1.727 m)  Wt 175 lb (79.379 kg)  BMI 26.61 kg/m2  SpO2 99% Physical Exam  Constitutional: He is oriented to person, place, and time. He appears well-developed and well-nourished. No distress.  HENT:  Head: Normocephalic and atraumatic.  Mouth/Throat: Oropharynx is clear and moist. Dental caries present.    Left upper 3rd molar with area of decay  and portion missing  Eyes: Conjunctivae and EOM are normal. Pupils are equal, round, and reactive to light.  Neck: Normal range of motion. Neck supple.  Cardiovascular: Regular rhythm.  Tachycardia present.   Pulmonary/Chest: Effort normal and breath sounds normal.  Musculoskeletal: Normal range of motion. He exhibits no edema or tenderness.  Lymphadenopathy:    He has no cervical adenopathy.  Neurological: He is alert and oriented to person, place, and time.  Skin: Skin is warm and dry. No rash noted. No erythema.  Psychiatric: He has a normal mood and affect. His behavior is normal.  Nursing note and vitals reviewed.   ED Course  Procedures (including critical care time) Labs Review Labs Reviewed - No data to display  Imaging Review No results found.   EKG Interpretation None      MDM   Final diagnoses:  Dental caries    Pt with dental caries and facial swelling.  No signs of ludwig's angina or difficulty swallowing and no systemic symptoms. Will treat with PCN and have pt f/u with dentist.     Gwyneth SproutWhitney Randle Shatzer, MD 11/16/14 1717  Gwyneth SproutWhitney Nik Gorrell, MD 11/16/14 1718

## 2014-11-16 NOTE — Discharge Instructions (Signed)
Dental Caries °Dental caries is tooth decay. This decay can cause a hole in teeth (cavity) that can get bigger and deeper over time. °HOME CARE °· Brush and floss your teeth. Do this at least two times a day. °· Use a fluoride toothpaste. °· Use a mouth rinse if told by your dentist or doctor. °· Eat less sugary and starchy foods. Drink less sugary drinks. °· Avoid snacking often on sugary and starchy foods. Avoid sipping often on sugary drinks. °· Keep regular checkups and cleanings with your dentist. °· Use fluoride supplements if told by your dentist or doctor. °· Allow fluoride to be applied to teeth if told by your dentist or doctor. °Document Released: 04/16/2008 Document Revised: 11/22/2013 Document Reviewed: 07/10/2012 °ExitCare® Patient Information ©2015 [employer]. This information is not intended to replace advice given to you by your health care provider. Make sure you discuss any questions you have with your health care provider. ° °

## 2015-04-22 DIAGNOSIS — W3400XA Accidental discharge from unspecified firearms or gun, initial encounter: Secondary | ICD-10-CM

## 2015-04-22 HISTORY — DX: Accidental discharge from unspecified firearms or gun, initial encounter: W34.00XA

## 2015-05-07 ENCOUNTER — Inpatient Hospital Stay (HOSPITAL_COMMUNITY)
Admission: EM | Admit: 2015-05-07 | Discharge: 2015-07-03 | DRG: 003 | Disposition: A | Discharge status: Rehabilitation Facility | Payer: No Typology Code available for payment source | Attending: General Surgery | Admitting: General Surgery

## 2015-05-07 ENCOUNTER — Encounter (HOSPITAL_COMMUNITY): Admission: EM | Disposition: A | Discharge status: Rehabilitation Facility | Payer: Self-pay | Source: Home / Self Care

## 2015-05-07 ENCOUNTER — Emergency Department (HOSPITAL_COMMUNITY): Payer: No Typology Code available for payment source | Admitting: Anesthesiology

## 2015-05-07 ENCOUNTER — Emergency Department (HOSPITAL_COMMUNITY): Payer: No Typology Code available for payment source

## 2015-05-07 ENCOUNTER — Encounter (HOSPITAL_COMMUNITY): Payer: Self-pay | Admitting: Emergency Medicine

## 2015-05-07 DIAGNOSIS — L89892 Pressure ulcer of other site, stage 2: Secondary | ICD-10-CM | POA: Diagnosis not present

## 2015-05-07 DIAGNOSIS — B9620 Unspecified Escherichia coli [E. coli] as the cause of diseases classified elsewhere: Secondary | ICD-10-CM | POA: Diagnosis not present

## 2015-05-07 DIAGNOSIS — K9413 Enterostomy malfunction: Secondary | ICD-10-CM | POA: Diagnosis not present

## 2015-05-07 DIAGNOSIS — W3400XA Accidental discharge from unspecified firearms or gun, initial encounter: Secondary | ICD-10-CM

## 2015-05-07 DIAGNOSIS — Z23 Encounter for immunization: Secondary | ICD-10-CM | POA: Diagnosis not present

## 2015-05-07 DIAGNOSIS — D638 Anemia in other chronic diseases classified elsewhere: Secondary | ICD-10-CM | POA: Diagnosis present

## 2015-05-07 DIAGNOSIS — S36898A Other injury of other intra-abdominal organs, initial encounter: Secondary | ICD-10-CM | POA: Diagnosis present

## 2015-05-07 DIAGNOSIS — K9423 Gastrostomy malfunction: Secondary | ICD-10-CM | POA: Diagnosis not present

## 2015-05-07 DIAGNOSIS — S36598A Other injury of other part of colon, initial encounter: Secondary | ICD-10-CM | POA: Diagnosis present

## 2015-05-07 DIAGNOSIS — Z91013 Allergy to seafood: Secondary | ICD-10-CM | POA: Diagnosis not present

## 2015-05-07 DIAGNOSIS — D6489 Other specified anemias: Secondary | ICD-10-CM | POA: Diagnosis not present

## 2015-05-07 DIAGNOSIS — Z978 Presence of other specified devices: Secondary | ICD-10-CM

## 2015-05-07 DIAGNOSIS — J151 Pneumonia due to Pseudomonas: Secondary | ICD-10-CM | POA: Diagnosis not present

## 2015-05-07 DIAGNOSIS — E43 Unspecified severe protein-calorie malnutrition: Secondary | ICD-10-CM | POA: Diagnosis not present

## 2015-05-07 DIAGNOSIS — R6521 Severe sepsis with septic shock: Secondary | ICD-10-CM | POA: Diagnosis not present

## 2015-05-07 DIAGNOSIS — Z09 Encounter for follow-up examination after completed treatment for conditions other than malignant neoplasm: Secondary | ICD-10-CM

## 2015-05-07 DIAGNOSIS — S35514A Injury of right iliac vein, initial encounter: Principal | ICD-10-CM | POA: Diagnosis present

## 2015-05-07 DIAGNOSIS — Z452 Encounter for adjustment and management of vascular access device: Secondary | ICD-10-CM

## 2015-05-07 DIAGNOSIS — E871 Hypo-osmolality and hyponatremia: Secondary | ICD-10-CM | POA: Diagnosis not present

## 2015-05-07 DIAGNOSIS — S36892A Contusion of other intra-abdominal organs, initial encounter: Secondary | ICD-10-CM | POA: Diagnosis present

## 2015-05-07 DIAGNOSIS — I82421 Acute embolism and thrombosis of right iliac vein: Secondary | ICD-10-CM | POA: Diagnosis not present

## 2015-05-07 DIAGNOSIS — T8131XA Disruption of external operation (surgical) wound, not elsewhere classified, initial encounter: Secondary | ICD-10-CM | POA: Diagnosis not present

## 2015-05-07 DIAGNOSIS — J9690 Respiratory failure, unspecified, unspecified whether with hypoxia or hypercapnia: Secondary | ICD-10-CM

## 2015-05-07 DIAGNOSIS — Y95 Nosocomial condition: Secondary | ICD-10-CM | POA: Diagnosis not present

## 2015-05-07 DIAGNOSIS — IMO0002 Reserved for concepts with insufficient information to code with codable children: Secondary | ICD-10-CM | POA: Diagnosis not present

## 2015-05-07 DIAGNOSIS — B965 Pseudomonas (aeruginosa) (mallei) (pseudomallei) as the cause of diseases classified elsewhere: Secondary | ICD-10-CM | POA: Diagnosis not present

## 2015-05-07 DIAGNOSIS — R0689 Other abnormalities of breathing: Secondary | ICD-10-CM

## 2015-05-07 DIAGNOSIS — N179 Acute kidney failure, unspecified: Secondary | ICD-10-CM | POA: Diagnosis present

## 2015-05-07 DIAGNOSIS — E876 Hypokalemia: Secondary | ICD-10-CM | POA: Diagnosis not present

## 2015-05-07 DIAGNOSIS — A4152 Sepsis due to Pseudomonas: Secondary | ICD-10-CM | POA: Diagnosis not present

## 2015-05-07 DIAGNOSIS — S31041A Puncture wound with foreign body of lower back and pelvis with penetration into retroperitoneum, initial encounter: Secondary | ICD-10-CM | POA: Diagnosis present

## 2015-05-07 DIAGNOSIS — K651 Peritoneal abscess: Secondary | ICD-10-CM | POA: Diagnosis not present

## 2015-05-07 DIAGNOSIS — R578 Other shock: Secondary | ICD-10-CM | POA: Diagnosis present

## 2015-05-07 DIAGNOSIS — Z9889 Other specified postprocedural states: Secondary | ICD-10-CM

## 2015-05-07 DIAGNOSIS — M21371 Foot drop, right foot: Secondary | ICD-10-CM | POA: Diagnosis not present

## 2015-05-07 DIAGNOSIS — T1490XA Injury, unspecified, initial encounter: Secondary | ICD-10-CM

## 2015-05-07 DIAGNOSIS — K9412 Enterostomy infection: Secondary | ICD-10-CM | POA: Diagnosis not present

## 2015-05-07 DIAGNOSIS — R509 Fever, unspecified: Secondary | ICD-10-CM

## 2015-05-07 DIAGNOSIS — D62 Acute posthemorrhagic anemia: Secondary | ICD-10-CM | POA: Diagnosis not present

## 2015-05-07 DIAGNOSIS — G4440 Drug-induced headache, not elsewhere classified, not intractable: Secondary | ICD-10-CM | POA: Diagnosis not present

## 2015-05-07 DIAGNOSIS — E8770 Fluid overload, unspecified: Secondary | ICD-10-CM | POA: Diagnosis not present

## 2015-05-07 DIAGNOSIS — M549 Dorsalgia, unspecified: Secondary | ICD-10-CM | POA: Diagnosis present

## 2015-05-07 DIAGNOSIS — E872 Acidosis: Secondary | ICD-10-CM | POA: Diagnosis present

## 2015-05-07 DIAGNOSIS — D72829 Elevated white blood cell count, unspecified: Secondary | ICD-10-CM | POA: Diagnosis not present

## 2015-05-07 DIAGNOSIS — F17210 Nicotine dependence, cigarettes, uncomplicated: Secondary | ICD-10-CM | POA: Diagnosis present

## 2015-05-07 DIAGNOSIS — J9600 Acute respiratory failure, unspecified whether with hypoxia or hypercapnia: Secondary | ICD-10-CM | POA: Diagnosis not present

## 2015-05-07 DIAGNOSIS — L8990 Pressure ulcer of unspecified site, unspecified stage: Secondary | ICD-10-CM | POA: Diagnosis not present

## 2015-05-07 DIAGNOSIS — S21239A Puncture wound without foreign body of unspecified back wall of thorax without penetration into thoracic cavity, initial encounter: Secondary | ICD-10-CM

## 2015-05-07 DIAGNOSIS — M726 Necrotizing fasciitis: Secondary | ICD-10-CM | POA: Diagnosis not present

## 2015-05-07 DIAGNOSIS — S36490A Other injury of duodenum, initial encounter: Secondary | ICD-10-CM | POA: Diagnosis present

## 2015-05-07 DIAGNOSIS — R0603 Acute respiratory distress: Secondary | ICD-10-CM

## 2015-05-07 DIAGNOSIS — S35511A Injury of right iliac artery, initial encounter: Secondary | ICD-10-CM

## 2015-05-07 DIAGNOSIS — R410 Disorientation, unspecified: Secondary | ICD-10-CM | POA: Diagnosis not present

## 2015-05-07 DIAGNOSIS — S35516A Injury of unspecified iliac vein, initial encounter: Secondary | ICD-10-CM | POA: Diagnosis present

## 2015-05-07 DIAGNOSIS — I82509 Chronic embolism and thrombosis of unspecified deep veins of unspecified lower extremity: Secondary | ICD-10-CM | POA: Diagnosis not present

## 2015-05-07 DIAGNOSIS — S299XXA Unspecified injury of thorax, initial encounter: Secondary | ICD-10-CM

## 2015-05-07 DIAGNOSIS — D696 Thrombocytopenia, unspecified: Secondary | ICD-10-CM | POA: Diagnosis present

## 2015-05-07 DIAGNOSIS — K567 Ileus, unspecified: Secondary | ICD-10-CM | POA: Diagnosis not present

## 2015-05-07 DIAGNOSIS — Z4659 Encounter for fitting and adjustment of other gastrointestinal appliance and device: Secondary | ICD-10-CM

## 2015-05-07 DIAGNOSIS — Z6832 Body mass index (BMI) 32.0-32.9, adult: Secondary | ICD-10-CM | POA: Diagnosis not present

## 2015-05-07 DIAGNOSIS — E870 Hyperosmolality and hypernatremia: Secondary | ICD-10-CM | POA: Diagnosis not present

## 2015-05-07 DIAGNOSIS — T3995XA Adverse effect of unspecified nonopioid analgesic, antipyretic and antirheumatic, initial encounter: Secondary | ICD-10-CM

## 2015-05-07 DIAGNOSIS — T814XXA Infection following a procedure, initial encounter: Secondary | ICD-10-CM | POA: Diagnosis not present

## 2015-05-07 DIAGNOSIS — J189 Pneumonia, unspecified organism: Secondary | ICD-10-CM

## 2015-05-07 DIAGNOSIS — S36409A Unspecified injury of unspecified part of small intestine, initial encounter: Secondary | ICD-10-CM | POA: Diagnosis present

## 2015-05-07 HISTORY — PX: LAPAROTOMY: SHX154

## 2015-05-07 LAB — CBC
HCT: 40.4 % (ref 39.0–52.0)
Hemoglobin: 14.2 g/dL (ref 13.0–17.0)
MCH: 30.7 pg (ref 26.0–34.0)
MCHC: 35.1 g/dL (ref 30.0–36.0)
MCV: 87.4 fL (ref 78.0–100.0)
Platelets: 41 10*3/uL — ABNORMAL LOW (ref 150–400)
RBC: 4.62 MIL/uL (ref 4.22–5.81)
RDW: 14.7 % (ref 11.5–15.5)
WBC: 7.6 10*3/uL (ref 4.0–10.5)

## 2015-05-07 LAB — ABO/RH: ABO/RH(D): O POS

## 2015-05-07 LAB — BLOOD GAS, ARTERIAL
Acid-base deficit: 9.9 mmol/L — ABNORMAL HIGH (ref 0.0–2.0)
Bicarbonate: 16 mEq/L — ABNORMAL LOW (ref 20.0–24.0)
Drawn by: 405301
FIO2: 100
MECHVT: 550 mL
O2 Saturation: 99.6 %
PEEP: 5 cmH2O
Patient temperature: 98.6
RATE: 16 resp/min
TCO2: 17.2 mmol/L (ref 0–100)
pCO2 arterial: 38.2 mmHg (ref 35.0–45.0)
pH, Arterial: 7.246 — ABNORMAL LOW (ref 7.350–7.450)
pO2, Arterial: 467 mmHg — ABNORMAL HIGH (ref 80.0–100.0)

## 2015-05-07 LAB — I-STAT CHEM 8, ED
BUN: 10 mg/dL (ref 6–20)
Calcium, Ion: 1.1 mmol/L — ABNORMAL LOW (ref 1.12–1.23)
Chloride: 108 mmol/L (ref 101–111)
Creatinine, Ser: 1.9 mg/dL — ABNORMAL HIGH (ref 0.61–1.24)
Glucose, Bld: 161 mg/dL — ABNORMAL HIGH (ref 65–99)
HCT: 41 % (ref 39.0–52.0)
Hemoglobin: 13.9 g/dL (ref 13.0–17.0)
Potassium: 4.3 mmol/L (ref 3.5–5.1)
Sodium: 141 mmol/L (ref 135–145)
TCO2: 15 mmol/L (ref 0–100)

## 2015-05-07 LAB — BASIC METABOLIC PANEL
Anion gap: 11 (ref 5–15)
BUN: 8 mg/dL (ref 6–20)
CO2: 18 mmol/L — ABNORMAL LOW (ref 22–32)
Calcium: 7.7 mg/dL — ABNORMAL LOW (ref 8.9–10.3)
Chloride: 108 mmol/L (ref 101–111)
Creatinine, Ser: 1.16 mg/dL (ref 0.61–1.24)
GFR calc Af Amer: >60 mL/min (ref >60)
GFR calc non Af Amer: >60 mL/min (ref >60)
Glucose, Bld: 149 mg/dL — ABNORMAL HIGH (ref 65–99)
Potassium: 4 mmol/L (ref 3.5–5.1)
Sodium: 137 mmol/L (ref 135–145)

## 2015-05-07 LAB — PROTIME-INR
INR: 1.34 (ref 0.00–1.49)
Prothrombin Time: 16.7 seconds — ABNORMAL HIGH (ref 11.6–15.2)

## 2015-05-07 LAB — LACTIC ACID, PLASMA: Lactic Acid, Venous: 3.5 mmol/L (ref 0.5–2.0)

## 2015-05-07 LAB — BLOOD PRODUCT ORDER (VERBAL) VERIFICATION

## 2015-05-07 LAB — APTT: aPTT: 34 seconds (ref 24–37)

## 2015-05-07 SURGERY — LAPAROTOMY, EXPLORATORY
Anesthesia: General | Site: Abdomen

## 2015-05-07 MED ORDER — CEFAZOLIN SODIUM-DEXTROSE 2-3 GM-% IV SOLR
INTRAVENOUS | Status: DC | PRN
  Administered 2015-05-07: 2 g via INTRAVENOUS

## 2015-05-07 MED ORDER — MIDAZOLAM HCL 2 MG/2ML IJ SOLN
2.0000 mg | INTRAMUSCULAR | Status: DC | PRN
  Administered 2015-05-12: 2 mg via INTRAVENOUS
  Filled 2015-05-07: qty 2

## 2015-05-07 MED ORDER — SUCCINYLCHOLINE CHLORIDE 20 MG/ML IJ SOLN
INTRAMUSCULAR | Status: AC
  Filled 2015-05-07: qty 1

## 2015-05-07 MED ORDER — MIDAZOLAM HCL 2 MG/2ML IJ SOLN: 2.0000 mg | Freq: Once | INTRAMUSCULAR | Status: DC

## 2015-05-07 MED ORDER — NEOSTIGMINE METHYLSULFATE 10 MG/10ML IV SOLN
INTRAVENOUS | Status: AC
  Filled 2015-05-07: qty 1

## 2015-05-07 MED ORDER — SODIUM CHLORIDE 0.9 % IV SOLN
INTRAVENOUS | Status: DC | PRN
  Administered 2015-05-07 (×2): via INTRAVENOUS

## 2015-05-07 MED ORDER — SODIUM CHLORIDE 0.9 % IV SOLN
INTRAVENOUS | Status: AC
  Administered 2015-05-07 – 2015-05-15 (×17): via INTRAVENOUS

## 2015-05-07 MED ORDER — FENTANYL BOLUS VIA INFUSION
50.0000 ug | INTRAVENOUS | Status: DC | PRN
  Filled 2015-05-07: qty 50

## 2015-05-07 MED ORDER — FENTANYL CITRATE (PF) 100 MCG/2ML IJ SOLN: 100.0000 ug | Freq: Once | INTRAMUSCULAR | Status: DC

## 2015-05-07 MED ORDER — EPHEDRINE SULFATE 50 MG/ML IJ SOLN
INTRAMUSCULAR | Status: AC
Start: 2015-05-07 — End: 2015-05-07
  Filled 2015-05-07: qty 1

## 2015-05-07 MED ORDER — FENTANYL CITRATE (PF) 100 MCG/2ML IJ SOLN: 50.0000 ug | Freq: Once | INTRAMUSCULAR | Status: DC

## 2015-05-07 MED ORDER — FENTANYL CITRATE (PF) 250 MCG/5ML IJ SOLN
INTRAMUSCULAR | Status: AC
  Filled 2015-05-07: qty 5

## 2015-05-07 MED ORDER — MIDAZOLAM HCL 5 MG/5ML IJ SOLN
INTRAMUSCULAR | Status: DC | PRN
  Administered 2015-05-07: 2 mg via INTRAVENOUS

## 2015-05-07 MED ORDER — SODIUM BICARBONATE 8.4 % IV SOLN
INTRAVENOUS | Status: DC | PRN
  Administered 2015-05-07 (×2): 50 meq via INTRAVENOUS

## 2015-05-07 MED ORDER — ROCURONIUM BROMIDE 50 MG/5ML IV SOLN
INTRAVENOUS | Status: AC
  Filled 2015-05-07: qty 1

## 2015-05-07 MED ORDER — ARTIFICIAL TEARS OP OINT
TOPICAL_OINTMENT | OPHTHALMIC | Status: AC
  Filled 2015-05-07: qty 3.5

## 2015-05-07 MED ORDER — PANTOPRAZOLE SODIUM 40 MG PO TBEC: 40.0000 mg | DELAYED_RELEASE_TABLET | Freq: Every day | ORAL | Status: DC

## 2015-05-07 MED ORDER — ROCURONIUM BROMIDE 100 MG/10ML IV SOLN
INTRAVENOUS | Status: DC | PRN
  Administered 2015-05-07: 30 mg via INTRAVENOUS
  Administered 2015-05-07: 100 mg via INTRAVENOUS
  Administered 2015-05-07: 50 mg via INTRAVENOUS

## 2015-05-07 MED ORDER — SODIUM CHLORIDE 0.9 % IV SOLN
25.0000 ug/h | INTRAVENOUS | Status: DC
  Administered 2015-05-07: 100 ug/h via INTRAVENOUS
  Administered 2015-05-08: 300 ug/h via INTRAVENOUS
  Filled 2015-05-07 (×3): qty 50

## 2015-05-07 MED ORDER — ONDANSETRON HCL 4 MG/2ML IJ SOLN
INTRAMUSCULAR | Status: AC
  Filled 2015-05-07: qty 2

## 2015-05-07 MED ORDER — CHLORHEXIDINE GLUCONATE 0.12% ORAL RINSE (MEDLINE KIT)
15.0000 mL | Freq: Two times a day (BID) | OROMUCOSAL | Status: DC
  Administered 2015-05-07 – 2015-05-13 (×12): 15 mL via OROMUCOSAL

## 2015-05-07 MED ORDER — MIDAZOLAM BOLUS VIA INFUSION
2.0000 mg | INTRAVENOUS | Status: DC | PRN
  Filled 2015-05-07: qty 2

## 2015-05-07 MED ORDER — NOREPINEPHRINE BITARTRATE 1 MG/ML IV SOLN
0.0000 ug/min | INTRAVENOUS | Status: DC
  Administered 2015-05-07: 5 ug/min via INTRAVENOUS
  Filled 2015-05-07: qty 4

## 2015-05-07 MED ORDER — SODIUM CHLORIDE 0.9 % IJ SOLN
INTRAMUSCULAR | Status: AC
  Filled 2015-05-07: qty 10

## 2015-05-07 MED ORDER — 0.9 % SODIUM CHLORIDE (POUR BTL) OPTIME
TOPICAL | Status: DC | PRN
  Administered 2015-05-07: 1000 mL

## 2015-05-07 MED ORDER — LIDOCAINE HCL (CARDIAC) 20 MG/ML IV SOLN
INTRAVENOUS | Status: AC
  Filled 2015-05-07: qty 5

## 2015-05-07 MED ORDER — GLYCOPYRROLATE 0.2 MG/ML IJ SOLN
INTRAMUSCULAR | Status: AC
  Filled 2015-05-07: qty 3

## 2015-05-07 MED ORDER — ETOMIDATE 2 MG/ML IV SOLN
INTRAVENOUS | Status: DC | PRN
  Administered 2015-05-07: 20 mg via INTRAVENOUS

## 2015-05-07 MED ORDER — SODIUM CHLORIDE 0.9 % IV SOLN
Freq: Once | INTRAVENOUS | Status: AC
  Administered 2015-05-09: 13:00:00 via INTRAVENOUS

## 2015-05-07 MED ORDER — PHENYLEPHRINE 40 MCG/ML (10ML) SYRINGE FOR IV PUSH (FOR BLOOD PRESSURE SUPPORT)
PREFILLED_SYRINGE | INTRAVENOUS | Status: AC
  Filled 2015-05-07: qty 10

## 2015-05-07 MED ORDER — ARTIFICIAL TEARS OP OINT
1.0000 "application " | TOPICAL_OINTMENT | Freq: Three times a day (TID) | OPHTHALMIC | Status: DC
  Administered 2015-05-07 – 2015-05-09 (×4): 1 via OPHTHALMIC
  Filled 2015-05-07: qty 3.5

## 2015-05-07 MED ORDER — DEXTROSE 5 % IV SOLN
10.0000 mg | INTRAVENOUS | Status: DC | PRN
  Administered 2015-05-07: 20 ug/min via INTRAVENOUS

## 2015-05-07 MED ORDER — ANTISEPTIC ORAL RINSE SOLUTION (CORINZ)
7.0000 mL | Freq: Four times a day (QID) | OROMUCOSAL | Status: DC
  Administered 2015-05-07 – 2015-05-08 (×5): 7 mL via OROMUCOSAL

## 2015-05-07 MED ORDER — VECURONIUM BROMIDE 10 MG IV SOLR
0.8000 ug/kg/min | INTRAVENOUS | Status: DC
  Administered 2015-05-07: 0.8 ug/kg/min via INTRAVENOUS
  Filled 2015-05-07: qty 100

## 2015-05-07 MED ORDER — MIDAZOLAM HCL 2 MG/2ML IJ SOLN: 2.0000 mg | Freq: Once | INTRAMUSCULAR | Status: DC | PRN

## 2015-05-07 MED ORDER — FENTANYL BOLUS VIA INFUSION
50.0000 ug | INTRAVENOUS | Status: DC | PRN
  Administered 2015-05-11 (×3): 100 ug via INTRAVENOUS
  Administered 2015-05-12 (×4): 50 ug via INTRAVENOUS
  Filled 2015-05-07 (×4): qty 50

## 2015-05-07 MED ORDER — SODIUM CHLORIDE 0.9 % IV SOLN
Freq: Once | INTRAVENOUS | Status: AC
  Administered 2015-05-07: 20:00:00 via INTRAVENOUS

## 2015-05-07 MED ORDER — FENTANYL CITRATE (PF) 2500 MCG/50ML IJ SOLN
25.0000 ug/h | INTRAMUSCULAR | Status: DC
  Filled 2015-05-07: qty 50

## 2015-05-07 MED ORDER — PROPOFOL 10 MG/ML IV BOLUS
INTRAVENOUS | Status: AC
  Filled 2015-05-07: qty 20

## 2015-05-07 MED ORDER — MIDAZOLAM HCL 2 MG/2ML IJ SOLN
INTRAMUSCULAR | Status: AC
  Filled 2015-05-07: qty 2

## 2015-05-07 MED ORDER — FENTANYL CITRATE (PF) 250 MCG/5ML IJ SOLN
INTRAMUSCULAR | Status: DC | PRN
  Administered 2015-05-07: 150 ug via INTRAVENOUS
  Administered 2015-05-07: 100 ug via INTRAVENOUS

## 2015-05-07 MED ORDER — LACTATED RINGERS IV SOLN
INTRAVENOUS | Status: DC | PRN
Start: 2015-05-07 — End: 2015-05-07
  Administered 2015-05-07 (×4): via INTRAVENOUS

## 2015-05-07 MED ORDER — SODIUM CHLORIDE 0.9 % IV SOLN
1.0000 mg/h | INTRAVENOUS | Status: DC
  Administered 2015-05-07: 1 mg/h via INTRAVENOUS
  Administered 2015-05-08 (×2): 10 mg/h via INTRAVENOUS
  Filled 2015-05-07 (×4): qty 10

## 2015-05-07 MED ORDER — MIDAZOLAM HCL 2 MG/2ML IJ SOLN
2.0000 mg | INTRAMUSCULAR | Status: DC | PRN
  Administered 2015-05-11: 2 mg via INTRAVENOUS
  Filled 2015-05-07 (×2): qty 2

## 2015-05-07 MED ORDER — FENTANYL CITRATE (PF) 100 MCG/2ML IJ SOLN: 100.0000 ug | Freq: Once | INTRAMUSCULAR | Status: DC | PRN

## 2015-05-07 MED ORDER — PANTOPRAZOLE SODIUM 40 MG IV SOLR
40.0000 mg | Freq: Every day | INTRAVENOUS | Status: DC
  Administered 2015-05-08 – 2015-06-03 (×28): 40 mg via INTRAVENOUS
  Filled 2015-05-07 (×28): qty 40

## 2015-05-07 MED ORDER — CALCIUM CHLORIDE 10 % IV SOLN
INTRAVENOUS | Status: DC | PRN
  Administered 2015-05-07: 200 mg via INTRAVENOUS
  Administered 2015-05-07: 300 mg via INTRAVENOUS
  Administered 2015-05-07: 500 mg via INTRAVENOUS

## 2015-05-07 SURGICAL SUPPLY — 41 items
BOOT SUTURE AID YELLOW STND (SUTURE) ×2 IMPLANT
CLIP TI MEDIUM 6 (CLIP) ×4 IMPLANT
DRAPE LAPAROSCOPIC ABDOMINAL (DRAPES) ×2 IMPLANT
DRAPE WARM FLUID 44X44 (DRAPE) ×2 IMPLANT
ELECT BLADE 6.5 EXT (BLADE) ×2 IMPLANT
ELECT REM PT RETURN 9FT ADLT (ELECTROSURGICAL) ×3
ELECTRODE REM PT RTRN 9FT ADLT (ELECTROSURGICAL) IMPLANT
GLOVE BIOGEL M 7.0 STRL (GLOVE) ×6 IMPLANT
GLOVE BIOGEL PI IND STRL 7.0 (GLOVE) IMPLANT
GLOVE BIOGEL PI IND STRL 7.5 (GLOVE) IMPLANT
GLOVE BIOGEL PI INDICATOR 7.0 (GLOVE) ×6
GLOVE BIOGEL PI INDICATOR 7.5 (GLOVE) ×4
GLOVE SKINSENSE NS SZ7.0 (GLOVE) ×6
GLOVE SKINSENSE STRL SZ7.0 (GLOVE) IMPLANT
GOWN STRL REUS W/TWL XL LVL3 (GOWN DISPOSABLE) ×6 IMPLANT
HEMOSTAT SNOW SURGICEL 2X4 (HEMOSTASIS) ×4 IMPLANT
HEMOSTAT SURGICEL 2X14 (HEMOSTASIS) ×2 IMPLANT
INSERT FOGARTY SM (MISCELLANEOUS) ×2 IMPLANT
KIT BASIN OR (CUSTOM PROCEDURE TRAY) ×2 IMPLANT
LIGACLIP LG (CLIP) ×6 IMPLANT
LIGACLIP MED TITANIUM (CLIP) ×2 IMPLANT
LIGASURE IMPACT 36 18CM CVD LR (INSTRUMENTS) ×2 IMPLANT
LOOP VESSEL MAXI BLUE (MISCELLANEOUS) ×2 IMPLANT
PACK GENERAL/GYN (CUSTOM PROCEDURE TRAY) ×2 IMPLANT
RELOAD PROXIMATE 75MM BLUE (ENDOMECHANICALS) ×15 IMPLANT
RELOAD STAPLE 75 3.8 BLU REG (ENDOMECHANICALS) IMPLANT
SPONGE LAP 18X18 X RAY DECT (DISPOSABLE) ×14 IMPLANT
STAPLER PROXIMATE 75MM BLUE (STAPLE) ×4 IMPLANT
STAPLER VISISTAT 35W (STAPLE) ×2 IMPLANT
SUCTION POOLE TIP (SUCTIONS) ×2 IMPLANT
SUT PDS AB 1 TP1 96 (SUTURE) ×2 IMPLANT
SUT PROLENE 4 0 RB 1 (SUTURE) ×12
SUT PROLENE 4-0 RB1 18X2 ARM (SUTURE) IMPLANT
SUT SILK 2 0 (SUTURE) ×3
SUT SILK 2 0 SH CR/8 (SUTURE) ×2 IMPLANT
SUT SILK 2-0 18XBRD TIE 12 (SUTURE) IMPLANT
SUT SILK 3 0 (SUTURE) ×3
SUT SILK 3 0 SH CR/8 (SUTURE) ×2 IMPLANT
SUT SILK 3-0 18XBRD TIE 12 (SUTURE) IMPLANT
TOWEL OR 17X26 10 PK STRL BLUE (TOWEL DISPOSABLE) ×2 IMPLANT
TRAY FOLEY W/METER SILVER 16FR (SET/KITS/TRAYS/PACK) ×2 IMPLANT

## 2015-05-07 NOTE — Transfer of Care (Signed)
Immediate Anesthesia Transfer of Care Note  Patient: Raymond Bowen  Procedure(s) Performed: Procedure(s): EXPLORATORY LAPAROTOMY (N/A)  Patient Location: ICU  Anesthesia Type:General  Level of Consciousness: Patient remains intubated per anesthesia plan  Airway & Oxygen Therapy: Patient remains intubated per anesthesia plan and Patient placed on Ventilator (see vital sign flow sheet for setting)  Post-op Assessment: Report given to RN and Post -op Vital signs reviewed and stable  Post vital signs: Reviewed and stable  Last Vitals:  Filed Vitals:   05/07/15 2025  BP:   Pulse: 102  Temp: 37 C  Resp: 16    Complications: No apparent anesthesia complications

## 2015-05-07 NOTE — Progress Notes (Signed)
CRITICAL VALUE ALERT  Critical value received:  Lactic Acid: 3.5   Date of notification:  05/07/2015  Time of notification:  2235  Critical value read back:Yes.    Nurse who received alert:  Bonita QuinLinda, RN   MD notified (1st page):  Kinsinger  Time of first page:  2244  MD notified (2nd page):  Time of second page:  Responding MD:  Gena FrayL. Kinsinger  Time MD responded: 2320  Updated MD on lab values, BP, wound vac. Order to transfuse 2FFP, 1 PLT. Call MD when wound vac cannister needs to be changed and to recollect labs at 0200. Will continue to monitor.
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
## 2015-05-07 NOTE — Anesthesia Postprocedure Evaluation (Signed)
  Anesthesia Post-op Note  Patient: Raymond Bowen  Procedure(s) Performed: Procedure(s): EXPLORATORY LAPAROTOMY (N/A)  Patient Location: SICU  Anesthesia Type:General  Level of Consciousness: sedated and Patient remains intubated per anesthesia plan  Airway and Oxygen Therapy: Patient remains intubated per anesthesia plan and Patient placed on Ventilator (see vital sign flow sheet for setting)  Post-op Pain: none  Post-op Assessment: Post-op Vital signs reviewed, Patient's Cardiovascular Status Stable and Respiratory Function Stable    Patient left with wound vac on open abdomen.  Remained intubated and transferred to ICU.  Hemodynamically stable on no pressors on arrival to ICU.  Satting 100% with no difficulty with ventilation.          Post-op Vital Signs: Reviewed and stable  Last Vitals:  Filed Vitals:   05/07/15 2044  BP:   Pulse: 99  Temp:   Resp: 16    Complications: No apparent anesthesia complications
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
## 2015-05-07 NOTE — Anesthesia Procedure Notes (Addendum)
Procedure Name: Intubation Date/Time: 05/07/2015 5:04 PM Performed by: Reine JustFLOWERS, ROKOSHI T Pre-anesthesia Checklist: Patient identified, Emergency Drugs available, Suction available, Patient being monitored and Timeout performed Patient Re-evaluated:Patient Re-evaluated prior to inductionOxygen Delivery Method: Circle system utilized and Simple face mask Preoxygenation: Pre-oxygenation with 100% oxygen Intubation Type: IV induction Laryngoscope Size: Glidescope and 4 Grade View: Grade I Tube type: Subglottic suction tube Number of attempts: 1 Airway Equipment and Method: Patient positioned with wedge pillow and Stylet Placement Confirmation: ETT inserted through vocal cords under direct vision,  positive ETCO2 and breath sounds checked- equal and bilateral Secured at: 26 cm Tube secured with: Tape Dental Injury: Teeth and Oropharynx as per pre-operative assessment  Comments: Per scott F, CRNA

## 2015-05-08 ENCOUNTER — Inpatient Hospital Stay (HOSPITAL_COMMUNITY): Payer: No Typology Code available for payment source

## 2015-05-08 ENCOUNTER — Encounter (HOSPITAL_COMMUNITY): Payer: Self-pay | Admitting: General Surgery

## 2015-05-08 LAB — POCT I-STAT 7, (LYTES, BLD GAS, ICA,H+H)
Acid-base deficit: 12 mmol/L — ABNORMAL HIGH (ref 0.0–2.0)
Acid-base deficit: 5 mmol/L — ABNORMAL HIGH (ref 0.0–2.0)
Bicarbonate: 16.7 mEq/L — ABNORMAL LOW (ref 20.0–24.0)
Bicarbonate: 20.1 mEq/L (ref 20.0–24.0)
Calcium, Ion: 1.07 mmol/L — ABNORMAL LOW (ref 1.12–1.23)
Calcium, Ion: 1 mmol/L — ABNORMAL LOW (ref 1.12–1.23)
HCT: 31 % — ABNORMAL LOW (ref 39.0–52.0)
HCT: 22 % — ABNORMAL LOW (ref 39.0–52.0)
Hemoglobin: 10.5 g/dL — ABNORMAL LOW (ref 13.0–17.0)
Hemoglobin: 7.5 g/dL — ABNORMAL LOW (ref 13.0–17.0)
O2 Saturation: 100 %
O2 Saturation: 100 %
Patient temperature: 35.7
Potassium: 4.1 mmol/L (ref 3.5–5.1)
Potassium: 3.8 mmol/L (ref 3.5–5.1)
Sodium: 140 mmol/L (ref 135–145)
Sodium: 141 mmol/L (ref 135–145)
TCO2: 18 mmol/L (ref 0–100)
TCO2: 21 mmol/L (ref 0–100)
pCO2 arterial: 49.5 mmHg — ABNORMAL HIGH (ref 35.0–45.0)
pCO2 arterial: 33 mmHg — ABNORMAL LOW (ref 35.0–45.0)
pH, Arterial: 7.136 — CL (ref 7.350–7.450)
pH, Arterial: 7.386 (ref 7.350–7.450)
pO2, Arterial: 427 mmHg — ABNORMAL HIGH (ref 80.0–100.0)
pO2, Arterial: 427 mmHg — ABNORMAL HIGH (ref 80.0–100.0)

## 2015-05-08 LAB — BLOOD GAS, ARTERIAL
Acid-base deficit: 5.4 mmol/L — ABNORMAL HIGH (ref 0.0–2.0)
Acid-base deficit: 6.4 mmol/L — ABNORMAL HIGH (ref 0.0–2.0)
Bicarbonate: 19 mEq/L — ABNORMAL LOW (ref 20.0–24.0)
Bicarbonate: 19.9 mEq/L — ABNORMAL LOW (ref 20.0–24.0)
Drawn by: 405301
Drawn by: 405301
FIO2: 0.4
FIO2: 0.4
MECHVT: 550 mL
MECHVT: 550 mL
O2 Saturation: 98.7 %
O2 Saturation: 98.8 %
PEEP: 5 cmH2O
PEEP: 5 cmH2O
Patient temperature: 101.6
Patient temperature: 98.6
RATE: 16 resp/min
RATE: 16 resp/min
TCO2: 20.3 mmol/L (ref 0–100)
TCO2: 21.2 mmol/L (ref 0–100)
pCO2 arterial: 40.9 mmHg (ref 35.0–45.0)
pCO2 arterial: 44.8 mmHg (ref 35.0–45.0)
pH, Arterial: 7.281 — ABNORMAL LOW (ref 7.350–7.450)
pH, Arterial: 7.289 — ABNORMAL LOW (ref 7.350–7.450)
pO2, Arterial: 141 mmHg — ABNORMAL HIGH (ref 80.0–100.0)
pO2, Arterial: 159 mmHg — ABNORMAL HIGH (ref 80.0–100.0)

## 2015-05-08 LAB — CBC WITH DIFFERENTIAL/PLATELET
Basophils Absolute: 0 10*3/uL (ref 0.0–0.1)
Basophils Absolute: 0 10*3/uL (ref 0.0–0.1)
Basophils Relative: 0 %
Basophils Relative: 0 %
Eosinophils Absolute: 0 10*3/uL (ref 0.0–0.7)
Eosinophils Absolute: 0 10*3/uL (ref 0.0–0.7)
Eosinophils Relative: 0 %
Eosinophils Relative: 0 %
HCT: 38.7 % — ABNORMAL LOW (ref 39.0–52.0)
HCT: 38.2 % — ABNORMAL LOW (ref 39.0–52.0)
Hemoglobin: 13.3 g/dL (ref 13.0–17.0)
Hemoglobin: 13.1 g/dL (ref 13.0–17.0)
Lymphocytes Relative: 12 %
Lymphocytes Relative: 11 %
Lymphs Abs: 1 10*3/uL (ref 0.7–4.0)
Lymphs Abs: 1.3 10*3/uL (ref 0.7–4.0)
MCH: 29.6 pg (ref 26.0–34.0)
MCH: 29.4 pg (ref 26.0–34.0)
MCHC: 34.4 g/dL (ref 30.0–36.0)
MCHC: 34.3 g/dL (ref 30.0–36.0)
MCV: 86.2 fL (ref 78.0–100.0)
MCV: 85.8 fL (ref 78.0–100.0)
Monocytes Absolute: 0.8 10*3/uL (ref 0.1–1.0)
Monocytes Absolute: 0.9 10*3/uL (ref 0.1–1.0)
Monocytes Relative: 9 %
Monocytes Relative: 7 %
Neutro Abs: 6.8 10*3/uL (ref 1.7–7.7)
Neutro Abs: 9.4 10*3/uL — ABNORMAL HIGH (ref 1.7–7.7)
Neutrophils Relative %: 79 %
Neutrophils Relative %: 81 %
Platelets: 56 10*3/uL — ABNORMAL LOW (ref 150–400)
Platelets: 62 10*3/uL — ABNORMAL LOW (ref 150–400)
RBC: 4.49 MIL/uL (ref 4.22–5.81)
RBC: 4.45 MIL/uL (ref 4.22–5.81)
RDW: 15.2 % (ref 11.5–15.5)
RDW: 15.6 % — ABNORMAL HIGH (ref 11.5–15.5)
WBC: 8.6 10*3/uL (ref 4.0–10.5)
WBC: 11.6 10*3/uL — ABNORMAL HIGH (ref 4.0–10.5)

## 2015-05-08 LAB — BASIC METABOLIC PANEL
Anion gap: 7 (ref 5–15)
BUN: 10 mg/dL (ref 6–20)
CO2: 23 mmol/L (ref 22–32)
Calcium: 7.7 mg/dL — ABNORMAL LOW (ref 8.9–10.3)
Chloride: 108 mmol/L (ref 101–111)
Creatinine, Ser: 1.34 mg/dL — ABNORMAL HIGH (ref 0.61–1.24)
GFR calc Af Amer: >60 mL/min (ref >60)
GFR calc non Af Amer: >60 mL/min (ref >60)
Glucose, Bld: 96 mg/dL (ref 65–99)
Potassium: 4.3 mmol/L (ref 3.5–5.1)
Sodium: 138 mmol/L (ref 135–145)

## 2015-05-08 LAB — CBC
HCT: 34.3 % — ABNORMAL LOW (ref 39.0–52.0)
Hemoglobin: 11.7 g/dL — ABNORMAL LOW (ref 13.0–17.0)
MCH: 29.5 pg (ref 26.0–34.0)
MCHC: 34.1 g/dL (ref 30.0–36.0)
MCV: 86.4 fL (ref 78.0–100.0)
Platelets: 64 10*3/uL — ABNORMAL LOW (ref 150–400)
RBC: 3.97 MIL/uL — ABNORMAL LOW (ref 4.22–5.81)
RDW: 15.1 % (ref 11.5–15.5)
WBC: 6.2 10*3/uL (ref 4.0–10.5)

## 2015-05-08 LAB — POCT I-STAT 4, (NA,K, GLUC, HGB,HCT)
Glucose, Bld: 237 mg/dL — ABNORMAL HIGH (ref 65–99)
HCT: 23 % — ABNORMAL LOW (ref 39.0–52.0)
Hemoglobin: 7.8 g/dL — ABNORMAL LOW (ref 13.0–17.0)
Potassium: 4.2 mmol/L (ref 3.5–5.1)
Sodium: 140 mmol/L (ref 135–145)

## 2015-05-08 LAB — PREPARE CRYOPRECIPITATE
Unit division: 0
Unit division: 0

## 2015-05-08 LAB — APTT: aPTT: 33 seconds (ref 24–37)

## 2015-05-08 LAB — PROTIME-INR
INR: 1.29 (ref 0.00–1.49)
INR: 1.26 (ref 0.00–1.49)
Prothrombin Time: 16.2 seconds — ABNORMAL HIGH (ref 11.6–15.2)
Prothrombin Time: 15.9 seconds — ABNORMAL HIGH (ref 11.6–15.2)

## 2015-05-08 LAB — PREPARE RBC (CROSSMATCH)

## 2015-05-08 LAB — MRSA PCR SCREENING: MRSA by PCR: NEGATIVE

## 2015-05-08 MED ORDER — SODIUM CHLORIDE 0.9 % IV SOLN
25.0000 ug/h | INTRAVENOUS | Status: DC
  Administered 2015-05-08 – 2015-05-10 (×7): 300 ug/h via INTRAVENOUS
  Administered 2015-05-10: 75 ug/h via INTRAVENOUS
  Administered 2015-05-11: 300 ug/h via INTRAVENOUS
  Administered 2015-05-11 – 2015-05-13 (×7): 400 ug/h via INTRAVENOUS
  Filled 2015-05-08 (×15): qty 50

## 2015-05-08 MED ORDER — CEFAZOLIN SODIUM-DEXTROSE 2-3 GM-% IV SOLR
2.0000 g | Freq: Three times a day (TID) | INTRAVENOUS | Status: AC
  Administered 2015-05-08 – 2015-05-09 (×6): 2 g via INTRAVENOUS
  Filled 2015-05-08 (×7): qty 50

## 2015-05-08 MED ORDER — SODIUM CHLORIDE 0.9 % IV SOLN
1.0000 mg/h | INTRAVENOUS | Status: DC
  Administered 2015-05-08: 10 mg/h via INTRAVENOUS
  Administered 2015-05-08: 12 mg/h via INTRAVENOUS
  Administered 2015-05-09: 10 mg/h via INTRAVENOUS
  Filled 2015-05-08 (×4): qty 10

## 2015-05-08 MED ORDER — NOREPINEPHRINE BITARTRATE 1 MG/ML IV SOLN
0.0000 ug/min | INTRAVENOUS | Status: DC
  Administered 2015-05-08: 18 ug/min via INTRAVENOUS
  Administered 2015-05-09: 8 ug/min via INTRAVENOUS
  Administered 2015-05-11: 4 ug/min via INTRAVENOUS
  Filled 2015-05-08 (×3): qty 16

## 2015-05-08 MED ORDER — INFLUENZA VAC SPLIT QUAD 0.5 ML IM SUSY
0.5000 mL | PREFILLED_SYRINGE | INTRAMUSCULAR | Status: AC
  Administered 2015-05-09: 0.5 mL via INTRAMUSCULAR

## 2015-05-08 MED ORDER — ANTISEPTIC ORAL RINSE SOLUTION (CORINZ)
7.0000 mL | OROMUCOSAL | Status: DC
  Administered 2015-05-09 – 2015-05-13 (×45): 7 mL via OROMUCOSAL

## 2015-05-08 MED ORDER — CEFAZOLIN (ANCEF) 1 G IV SOLR: 2.0000 g | Freq: Three times a day (TID) | INTRAVENOUS | Status: DC
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
## 2015-05-08 NOTE — Progress Notes (Signed)
Follow up - Trauma and Critical Care  Patient Details:    Raymond Bowen is an 39 y.o. male.  Lines/tubes : Airway 8 mm (Active)  Secured at (cm) 26 cm 05/08/2015  9:52 AM  Measured From Lips 05/08/2015  9:52 AM  Secured Location Left 05/08/2015  9:52 AM  Secured By [employer] 05/08/2015  9:52 AM  Tube Holder Repositioned Yes 05/08/2015  9:52 AM  Cuff Pressure (cm H2O) 25 cm H2O 05/08/2015  3:56 AM  Site Condition Dry;Cool 05/08/2015  9:52 AM     Airway (Active)  Secured at (cm) 26 cm 05/07/2015 12:00 AM     Arterial Line 05/07/15 Left Radial (Active)  Site Assessment Clean;Dry;Intact 05/08/2015 10:00 AM  Line Status Pulsatile blood flow 05/08/2015 10:00 AM  Art Line Waveform Appropriate 05/08/2015 10:00 AM  Art Line Interventions Zeroed and calibrated 05/08/2015 10:00 AM  Color/Movement/Sensation Capillary refill less than 3 sec 05/08/2015 10:00 AM  Dressing Type Transparent 05/08/2015 10:00 AM  Dressing Status Clean;Dry;Intact 05/08/2015 10:00 AM     Negative Pressure Wound Therapy Abdomen Medial (Active)  Site / Wound Assessment Dressing in place / Unable to assess 05/07/2015  8:00 PM  Cycle Continuous 05/07/2015  8:00 PM  Target Pressure (mmHg) 125 05/07/2015  8:00 PM  Canister Changed Yes 05/08/2015 12:30 AM  Drainage Amount Copious 05/07/2015  8:00 PM  Drainage Description Sanguineous 05/07/2015  8:00 PM  Output (mL) 150 mL 05/08/2015  7:00 AM     NG/OG Tube Nasogastric 16 Fr. Right nare (Active)  Site Assessment Clean;Dry;Intact 05/07/2015  8:00 PM  Status Suction-low intermittent 05/08/2015  7:00 AM  Drainage Appearance Bile;Brown 05/08/2015  8:00 AM     Urethral Catheter [place] RN Latex 16 Fr. (Active)  Indication for Insertion or Continuance of Catheter Unstable critical patients (first 24-48 hours) 05/08/2015  8:00 AM  Site Assessment Clean;Intact 05/07/2015  8:00 PM  Catheter Maintenance Bag below level of bladder;Catheter secured;Drainage  bag/tubing not touching floor;Insertion date on drainage bag;No dependent loops;Seal intact;Bag emptied prior to transport 05/08/2015  8:00 AM  Collection Container Standard drainage bag 05/07/2015  8:00 PM  Securement Method Securing device (Describe) 05/07/2015  8:00 PM  Output (mL) 75 mL 05/08/2015  9:00 AM    Microbiology/Sepsis markers: Results for orders placed or performed during the hospital encounter of 05/07/15  MRSA PCR Screening     Status: None   Collection Time: 05/07/15 11:43 PM  Result Value Ref Range Status   MRSA by PCR NEGATIVE NEGATIVE Final    Comment:        The GeneXpert MRSA Assay (FDA approved for NASAL specimens only), is one component of a comprehensive MRSA colonization surveillance program. It is not intended to diagnose MRSA infection nor to guide or monitor treatment for MRSA infections.     Anti-infectives:  Anti-infectives    Start     Dose/Rate Route Frequency Ordered Stop   05/08/15 0200  ceFAZolin (ANCEF) IVPB 2 g/50 mL premix     2 g 100 mL/hr over 30 Minutes Intravenous 3 times per day 05/08/15 0140     05/08/15 0130  ceFAZolin (ANCEF) powder 2 g  Status:  Discontinued     2 g Other Every 8 hours 05/08/15 0129 05/08/15 0139      Best Practice/Protocols:  VTE Prophylaxis: Mechanical GI Prophylaxis: Proton Pump Inhibitor Continous Sedation also has a paralytic and Levophed pressor.  Consults: Treatment Team:  Nada Libman, MD    Events:  Subjective:  Overnight Issues: Patient has received a lot of blood products and seems to be a lot more stable this AM.  Objective:  Vital signs for last 24 hours: Temp:  [98.6 F (37 C)-101.7 F (38.7 C)] 99.6 F (37.6 C) (10/17 0945) Pulse Rate:  [99-117] 106 (10/17 0952) Resp:  [8-36] 16 (10/17 0952) BP: (77-143)/(50-101) 113/61 mmHg (10/17 0952) SpO2:  [82 %-100 %] 100 % (10/17 0952) Arterial Line BP: (74-134)/(43-84) 114/63 mmHg (10/17 0945) FiO2 (%):  [30 %-100 %] 30 %  (10/17 0952) Weight:  [90.719 kg (200 lb)-98.7 kg (217 lb 9.5 oz)] 98.7 kg (217 lb 9.5 oz) (10/16 2100)  Hemodynamic parameters for last 24 hours:    Intake/Output from previous day: 10/16 0701 - 10/17 0700 In: 13419.3 [I.V.:5975.3; ZOXWR:6045; IV Piggyback:50] Out: 6110 [Urine:985; Drains:1125; Blood:4000]  Intake/Output this shift: Total I/O In: 1529.6 [I.V.:268.7; Blood:1260.9] Out: 155 [Urine:155]  Vent settings for last 24 hours: Vent Mode:  [-] PRVC FiO2 (%):  [30 %-100 %] 30 % Set Rate:  [16 bmp] 16 bmp Vt Set:  [550 mL] 550 mL PEEP:  [5 cmH20] 5 cmH20 Plateau Pressure:  [20 cmH20] 20 cmH20  Physical Exam:  General: no respiratory distress Neuro: RASS -3 or deeper and on paralytic Resp: clear to auscultation bilaterally CVS: regular rate and rhythm, S1, S2 normal, no murmur, click, rub or gallop and mild tachycardia GI: Open abdomen with NPWT.  Has drained over a liter of bloody effuent..   Extremities: edema 2+, pulses doppler,  and Cannot palpate any pulsed directly.  Results for orders placed or performed during the hospital encounter of 05/07/15 (from the past 24 hour(s))  Prepare fresh frozen plasma     Status: None (Preliminary result)   Collection Time: 05/07/15  4:32 PM  Result Value Ref Range   Unit Number [id number]    Blood Component Type LIQ PLASMA    Unit division 00    Status of Unit ISSUED,FINAL    Unit tag comment VERBAL ORDERS PER DR POLLIN    Transfusion Status OK TO TRANSFUSE    Unit Number [id number]    Blood Component Type LIQ PLASMA    Unit division 00    Status of Unit ISSUED,FINAL    Unit tag comment VERBAL ORDERS PER DR POLLIN    Transfusion Status OK TO TRANSFUSE    Unit Number [id number]    Blood Component Type THAWED PLASMA    Unit division 00    Status of Unit ISSUED,FINAL    Transfusion Status OK TO TRANSFUSE    Unit Number [id number]    Blood Component Type THAWED PLASMA    Unit division 00    Status of Unit  ISSUED,FINAL    Transfusion Status OK TO TRANSFUSE    Unit Number [id number]    Blood Component Type THAWED PLASMA    Unit division 00    Status of Unit ISSUED,FINAL    Transfusion Status OK TO TRANSFUSE    Unit Number [id number]    Blood Component Type THAWED PLASMA    Unit division 00    Status of Unit ISSUED,FINAL    Transfusion Status OK TO TRANSFUSE    Unit Number [id number]    Blood Component Type THAWED PLASMA    Unit division 00    Status of Unit ISSUED,FINAL    Transfusion Status OK TO TRANSFUSE    Unit Number [id number]    Blood Component Type THAWED PLASMA    Unit division 00  Status of Unit ISSUED    Transfusion Status OK TO TRANSFUSE    Unit Number [id number]    Blood Component Type THAWED PLASMA    Unit division 00    Status of Unit ISSUED    Transfusion Status OK TO TRANSFUSE    Unit Number [id number]    Blood Component Type THAWED PLASMA    Unit division 00    Status of Unit ISSUED,FINAL    Transfusion Status OK TO TRANSFUSE   Type and screen     Status: None (Preliminary result)   Collection Time: 05/07/15  4:42 PM  Result Value Ref Range   ABO/RH(D) O POS    Antibody Screen NEG    Sample Expiration 05/10/2015    Unit Number [id number]    Blood Component Type RED CELLS,LR    Unit division 00    Status of Unit ISSUED,FINAL    Unit tag comment VERBAL ORDERS PER DR POLLIN    Transfusion Status OK TO TRANSFUSE    Crossmatch Result COMPATIBLE    Unit Number [id number]    Blood Component Type RED CELLS,LR    Unit division 00    Status of Unit ISSUED,FINAL    Unit tag comment VERBAL ORDERS PER DR POLLIN    Transfusion Status OK TO TRANSFUSE    Crossmatch Result COMPATIBLE    Unit Number [id number]    Blood Component Type RBC LR PHER2    Unit division 00    Status of Unit ISSUED,FINAL    Transfusion Status OK TO TRANSFUSE    Crossmatch Result Compatible    Unit Number [id number]    Blood Component Type RBC LR PHER1     Unit division 00    Status of Unit ISSUED,FINAL    Transfusion Status OK TO TRANSFUSE    Crossmatch Result Compatible    Unit Number [id number]    Blood Component Type RED CELLS,LR    Unit division 00    Status of Unit ISSUED,FINAL    Transfusion Status OK TO TRANSFUSE    Crossmatch Result Compatible    Unit Number [id number]    Blood Component Type RED CELLS,LR    Unit division 00    Status of Unit ISSUED,FINAL    Transfusion Status OK TO TRANSFUSE    Crossmatch Result Compatible    Unit Number [id number]    Blood Component Type RED CELLS,LR    Unit division 00    Status of Unit ISSUED,FINAL    Transfusion Status OK TO TRANSFUSE    Crossmatch Result Compatible    Unit Number [id number]    Blood Component Type RED CELLS,LR    Unit division 00    Status of Unit ISSUED,FINAL    Transfusion Status OK TO TRANSFUSE    Crossmatch Result Compatible    Unit Number [id number]    Blood Component Type RED CELLS,LR    Unit division 00    Status of Unit ISSUED,FINAL    Transfusion Status OK TO TRANSFUSE    Crossmatch Result Compatible    Unit Number [id number]    Blood Component Type RED CELLS,LR    Unit division 00    Status of Unit ISSUED,FINAL    Transfusion Status OK TO TRANSFUSE    Crossmatch Result Compatible    Unit Number [id number]    Blood Component Type RED CELLS,LR    Unit division 00    Status of Unit ISSUED,FINAL    Transfusion Status OK TO TRANSFUSE    Crossmatch Result  Compatible    Unit Number [id number]    Blood Component Type RBC LR PHER1    Unit division 00    Status of Unit ISSUED,FINAL    Transfusion Status OK TO TRANSFUSE    Crossmatch Result Compatible    Unit Number [id number]    Blood Component Type RED CELLS,LR    Unit division 00    Status of Unit ISSUED    Transfusion Status OK TO TRANSFUSE    Crossmatch Result Compatible    Unit Number [id number]    Blood Component Type RBC LR PHER1    Unit division 00     Status of Unit ISSUED    Transfusion Status OK TO TRANSFUSE    Crossmatch Result Compatible    Unit Number [id number]    Blood Component Type RBC LR PHER1    Unit division 00    Status of Unit ALLOCATED    Transfusion Status OK TO TRANSFUSE    Crossmatch Result Compatible    Unit Number [id number]    Blood Component Type RED CELLS,LR    Unit division 00    Status of Unit ALLOCATED    Transfusion Status OK TO TRANSFUSE    Crossmatch Result Compatible   ABO/Rh     Status: None   Collection Time: 05/07/15  4:42 PM  Result Value Ref Range   ABO/RH(D) O POS   I-stat chem 8, ed     Status: Abnormal   Collection Time: 05/07/15  5:00 PM  Result Value Ref Range   Sodium 141 135 - 145 mmol/L   Potassium 4.3 3.5 - 5.1 mmol/L   Chloride 108 101 - 111 mmol/L   BUN 10 6 - 20 mg/dL   Creatinine, Ser 2.59 (H) 0.61 - 1.24 mg/dL   Glucose, Bld 563 (H) 65 - 99 mg/dL   Calcium, Ion 8.75 (L) 1.12 - 1.23 mmol/L   TCO2 15 0 - 100 mmol/L   Hemoglobin 13.9 13.0 - 17.0 g/dL   HCT 64.3 32.9 - 51.8 %  I-STAT 7, (LYTES, BLD GAS, ICA, H+H)     Status: Abnormal   Collection Time: 05/07/15  5:29 PM  Result Value Ref Range   pH, Arterial 7.136 (LL) 7.350 - 7.450   pCO2 arterial 49.5 (H) 35.0 - 45.0 mmHg   pO2, Arterial 427.0 (H) 80.0 - 100.0 mmHg   Bicarbonate 16.7 (L) 20.0 - 24.0 mEq/L   TCO2 18 0 - 100 mmol/L   O2 Saturation 100.0 %   Acid-base deficit 12.0 (H) 0.0 - 2.0 mmol/L   Sodium 140 135 - 145 mmol/L   Potassium 4.1 3.5 - 5.1 mmol/L   Calcium, Ion 1.07 (L) 1.12 - 1.23 mmol/L   HCT 31.0 (L) 39.0 - 52.0 %   Hemoglobin 10.5 (L) 13.0 - 17.0 g/dL   Patient temperature HIDE    Sample type ARTERIAL    Comment NOTIFIED PHYSICIAN   I-STAT 7, (LYTES, BLD GAS, ICA, H+H)     Status: Abnormal   Collection Time: 05/07/15  6:17 PM  Result Value Ref Range   pH, Arterial 7.386 7.350 - 7.450   pCO2 arterial 33.0 (L) 35.0 - 45.0 mmHg   pO2, Arterial 427.0 (H) 80.0 - 100.0 mmHg   Bicarbonate 20.1  20.0 - 24.0 mEq/L   TCO2 21 0 - 100 mmol/L   O2 Saturation 100.0 %   Acid-base deficit 5.0 (H) 0.0 - 2.0 mmol/L   Sodium 141 135 - 145 mmol/L   Potassium 3.8 3.5 -  5.1 mmol/L   Calcium, Ion 1.00 (L) 1.12 - 1.23 mmol/L   HCT 22.0 (L) 39.0 - 52.0 %   Hemoglobin 7.5 (L) 13.0 - 17.0 g/dL   Patient temperature 16.1 C    Sample type ARTERIAL   Prepare cryoprecipitate     Status: None   Collection Time: 05/07/15  7:00 PM  Result Value Ref Range   Unit Number [id number]    Blood Component Type CRYPOOL THAW    Unit division 00    Status of Unit ISSUED,FINAL    Transfusion Status OK TO TRANSFUSE    Unit Number [id number]    Blood Component Type CRYPOOL THAW    Unit division 00    Status of Unit ISSUED,FINAL    Transfusion Status OK TO TRANSFUSE   I-STAT 4, (NA,K, GLUC, HGB,HCT)     Status: Abnormal   Collection Time: 05/07/15  7:10 PM  Result Value Ref Range   Sodium 140 135 - 145 mmol/L   Potassium 4.2 3.5 - 5.1 mmol/L   Glucose, Bld 237 (H) 65 - 99 mg/dL   HCT 65.7 (L) 84.6 - 96.2 %   Hemoglobin 7.8 (L) 13.0 - 17.0 g/dL  Prepare platelet pheresis     Status: None (Preliminary result)   Collection Time: 05/07/15  7:18 PM  Result Value Ref Range   Unit Number [id number]    Blood Component Type PLTP LR2 PAS    Unit division 00    Status of Unit DISCARDED    Transfusion Status OK TO TRANSFUSE    Unit Number [id number]    Blood Component Type PLTP LR1 PAS    Unit division 00    Status of Unit ISSUED    Transfusion Status OK TO TRANSFUSE   Provider-confirm verbal Blood Bank order - FFP, Platelet Pheresis, Cryoprecipitate; 8 Units; Order taken: 05/07/2015; 7:05 PM; Level 1 Trauma, Surgery Verbal orders received from OR for 8 units of thawed plasma, 2 pools of cryo, 2 apheresis platelets     Status: None   Collection Time: 05/07/15  7:44 PM  Result Value Ref Range   Blood product order confirm MD AUTHORIZATION REQUESTED   Provider-confirm verbal Blood Bank order - Type &  Screen, RBC, FFP; 2 Units; Order taken: 05/07/2015; 4:25 PM; Level 1 Trauma, Emergency Release, STAT     Status: None   Collection Time: 05/07/15  7:50 PM  Result Value Ref Range   Blood product order confirm MD AUTHORIZATION REQUESTED   Blood gas, arterial     Status: Abnormal   Collection Time: 05/07/15  8:33 PM  Result Value Ref Range   FIO2 100.00    Delivery systems VENTILATOR    Mode PRESSURE REGULATED VOLUME CONTROL    VT 550 mL   LHR 16 resp/min   Peep/cpap 5.0 cm H20   pH, Arterial 7.246 (L) 7.350 - 7.450   pCO2 arterial 38.2 35.0 - 45.0 mmHg   pO2, Arterial 467 (H) 80.0 - 100.0 mmHg   Bicarbonate 16.0 (L) 20.0 - 24.0 mEq/L   TCO2 17.2 0 - 100 mmol/L   Acid-base deficit 9.9 (H) 0.0 - 2.0 mmol/L   O2 Saturation 99.6 %   Patient temperature 98.6    Collection site A-LINE    Drawn by [id number]    Sample type ARTERIAL    Allens test (pass/fail) PASS PASS  CBC     Status: Abnormal   Collection Time: 05/07/15  9:45 PM  Result Value Ref Range   WBC 7.6  4.0 - 10.5 K/uL   RBC 4.62 4.22 - 5.81 MIL/uL   Hemoglobin 14.2 13.0 - 17.0 g/dL   HCT 21.3 08.6 - 57.8 %   MCV 87.4 78.0 - 100.0 fL   MCH 30.7 26.0 - 34.0 pg   MCHC 35.1 30.0 - 36.0 g/dL   RDW 46.9 62.9 - 52.8 %   Platelets 41 (L) 150 - 400 K/uL  Basic metabolic panel     Status: Abnormal   Collection Time: 05/07/15  9:45 PM  Result Value Ref Range   Sodium 137 135 - 145 mmol/L   Potassium 4.0 3.5 - 5.1 mmol/L   Chloride 108 101 - 111 mmol/L   CO2 18 (L) 22 - 32 mmol/L   Glucose, Bld 149 (H) 65 - 99 mg/dL   BUN 8 6 - 20 mg/dL   Creatinine, Ser 4.13 0.61 - 1.24 mg/dL   Calcium 7.7 (L) 8.9 - 10.3 mg/dL   GFR calc non Af Amer >60 >60 mL/min   GFR calc Af Amer >60 >60 mL/min   Anion gap 11 5 - 15  Protime-INR     Status: Abnormal   Collection Time: 05/07/15  9:45 PM  Result Value Ref Range   Prothrombin Time 16.7 (H) 11.6 - 15.2 seconds   INR 1.34 0.00 - 1.49  APTT     Status: None   Collection Time: 05/07/15  9:45  PM  Result Value Ref Range   aPTT 34 24 - 37 seconds  Lactic acid, plasma     Status: Abnormal   Collection Time: 05/07/15 10:47 PM  Result Value Ref Range   Lactic Acid, Venous 3.5 (HH) 0.5 - 2.0 mmol/L  MRSA PCR Screening     Status: None   Collection Time: 05/07/15 11:43 PM  Result Value Ref Range   MRSA by PCR NEGATIVE NEGATIVE  Blood gas, arterial     Status: Abnormal   Collection Time: 05/08/15  1:50 AM  Result Value Ref Range   FIO2 0.40    Delivery systems VENTILATOR    Mode PRESSURE REGULATED VOLUME CONTROL    VT 550 mL   LHR 16 resp/min   Peep/cpap 5.0 cm H20   pH, Arterial 7.289 (L) 7.350 - 7.450   pCO2 arterial 40.9 35.0 - 45.0 mmHg   pO2, Arterial 141 (H) 80.0 - 100.0 mmHg   Bicarbonate 19.0 (L) 20.0 - 24.0 mEq/L   TCO2 20.3 0 - 100 mmol/L   Acid-base deficit 6.4 (H) 0.0 - 2.0 mmol/L   O2 Saturation 98.7 %   Patient temperature 98.6    Collection site ARTERIAL LINE    Drawn by [id number]    Sample type ARTERIAL DRAW   Blood gas, arterial     Status: Abnormal   Collection Time: 05/08/15  2:16 AM  Result Value Ref Range   FIO2 0.40    Delivery systems VENTILATOR    Mode PRESSURE REGULATED VOLUME CONTROL    VT 550 mL   LHR 16 resp/min   Peep/cpap 5.0 cm H20   pH, Arterial 7.281 (L) 7.350 - 7.450   pCO2 arterial 44.8 35.0 - 45.0 mmHg   pO2, Arterial 159 (H) 80.0 - 100.0 mmHg   Bicarbonate 19.9 (L) 20.0 - 24.0 mEq/L   TCO2 21.2 0 - 100 mmol/L   Acid-base deficit 5.4 (H) 0.0 - 2.0 mmol/L   O2 Saturation 98.8 %   Patient temperature 101.6    Collection site A-LINE    Drawn by [id number]  Sample type ARTERIAL DRAW   Basic metabolic panel     Status: Abnormal   Collection Time: 05/08/15  3:45 AM  Result Value Ref Range   Sodium 138 135 - 145 mmol/L   Potassium 4.3 3.5 - 5.1 mmol/L   Chloride 108 101 - 111 mmol/L   CO2 23 22 - 32 mmol/L   Glucose, Bld 96 65 - 99 mg/dL   BUN 10 6 - 20 mg/dL   Creatinine, Ser 0.98 (H) 0.61 - 1.24 mg/dL   Calcium 7.7 (L) 8.9 -  10.3 mg/dL   GFR calc non Af Amer >60 >60 mL/min   GFR calc Af Amer >60 >60 mL/min   Anion gap 7 5 - 15  CBC     Status: Abnormal   Collection Time: 05/08/15  3:45 AM  Result Value Ref Range   WBC 6.2 4.0 - 10.5 K/uL   RBC 3.97 (L) 4.22 - 5.81 MIL/uL   Hemoglobin 11.7 (L) 13.0 - 17.0 g/dL   HCT 11.9 (L) 14.7 - 82.9 %   MCV 86.4 78.0 - 100.0 fL   MCH 29.5 26.0 - 34.0 pg   MCHC 34.1 30.0 - 36.0 g/dL   RDW 56.2 13.0 - 86.5 %   Platelets 64 (L) 150 - 400 K/uL  APTT     Status: None   Collection Time: 05/08/15  3:45 AM  Result Value Ref Range   aPTT 33 24 - 37 seconds  Protime-INR     Status: Abnormal   Collection Time: 05/08/15  3:45 AM  Result Value Ref Range   Prothrombin Time 16.2 (H) 11.6 - 15.2 seconds   INR 1.29 0.00 - 1.49  Prepare RBC     Status: None   Collection Time: 05/08/15  4:42 AM  Result Value Ref Range   Order Confirmation ORDER PROCESSED BY BLOOD BANK   Prepare Pheresed Platelets     Status: None (Preliminary result)   Collection Time: 05/08/15  4:42 AM  Result Value Ref Range   Unit Number [id number]    Blood Component Type PLTP LR1 PAS    Unit division 00    Status of Unit ISSUED    Transfusion Status OK TO TRANSFUSE      Assessment/Plan:   NEURO  Altered Mental Status:  sedation and also paralyzed chemically which willb e stopped.   Plan: Sedate until going back to surgery tomorrow.  PULM  No specific issues   Plan: CPM  CARDIO  No specific issues.   Plan: CPM  RENAL  Oliguria (probably hypovolemia and low effective intravascular volume)   Plan: CPM  GI  Bowel Trauma with small bowel perforation and resection.  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  ID  No known infectious sources    Plan: CPM  HEME  Anemia acute blood loss anemia)   Plan: Platelets are low also.  Labs are currently pending.   ENDO No specific issues.   Plan: CPM  Global Issues  Patient doing as well as can be expected..    Will need to go back to the OR for packing removal and assessment of bleeding.   May beable to close after the next operation.    LOS: 1 day   Additional comments:I reviewed the patient's new clinical lab test results. cbc/bmet and I reviewed the patients new imaging test results. cxr  Critical Care Total Time*: 45 Minutes  Lorie Melichar 05/08/2015  *Care during the described time interval was provided by me and/or other providers on the critical care team.  I have reviewed this patient's available data, including medical history, events of note, physical examination and test results as part of my evaluation.

## 2015-05-08 NOTE — Progress Notes (Signed)
Pt. Wound vac has drained 850 ml of blood off abdominal wound since 7am today.  Dr. Lindie SpruceWyatt made aware and CBC w/ differential ordered STAT and for 0500 tomorrow.  Will continue to monitor.

## 2015-05-08 NOTE — Progress Notes (Addendum)
MD called for update on pt. Reviewed vitals, wound vac output, previous blood products received, and labs. Will call back with update of labs.   ~0430: Updated MD on labs. Orders to transfuse 1PLT, 2FFP, 2 PRBC.

## 2015-05-08 NOTE — Progress Notes (Signed)
Utilization review completed. Najee Manninen, RN, BSN. 

## 2015-05-08 NOTE — Progress Notes (Signed)
~  0130: Noted Pt temp 101.2. MD notified of temp and increasing HR to 130bpm. Order to continue to give blood products and give 2g Cefazolin q8h. Will continue to monitor

## 2015-05-08 NOTE — Addendum Note (Signed)
Addendum  created 05/08/15 0151 by Cecile HearingStephen Edward Turk, MD   Modules edited: Anesthesia Blocks and Procedures, Clinical Notes, [employer] Capture   Clinical Notes:  File: [id number]   [employer] Capture:  DocType:Photos,DocID:[id number]

## 2015-05-09 ENCOUNTER — Inpatient Hospital Stay (HOSPITAL_COMMUNITY): Payer: No Typology Code available for payment source

## 2015-05-09 ENCOUNTER — Inpatient Hospital Stay (HOSPITAL_COMMUNITY): Payer: No Typology Code available for payment source | Admitting: Anesthesiology

## 2015-05-09 ENCOUNTER — Encounter (HOSPITAL_COMMUNITY): Admission: EM | Disposition: A | Discharge status: Rehabilitation Facility | Payer: Self-pay | Source: Home / Self Care

## 2015-05-09 HISTORY — PX: LAPAROTOMY: SHX154

## 2015-05-09 HISTORY — PX: COLON RESECTION: SHX5231

## 2015-05-09 HISTORY — PX: APPLICATION OF WOUND VAC: SHX5189

## 2015-05-09 LAB — TYPE AND SCREEN
ABO/RH(D): O POS
Antibody Screen: NEGATIVE
Unit division: 0
Unit division: 0
Unit division: 0
Unit division: 0
Unit division: 0
Unit division: 0
Unit division: 0
Unit division: 0
Unit division: 0
Unit division: 0
Unit division: 0
Unit division: 0
Unit division: 0
Unit division: 0
Unit division: 0
Unit division: 0

## 2015-05-09 LAB — CBC WITH DIFFERENTIAL/PLATELET
Basophils Absolute: 0 10*3/uL (ref 0.0–0.1)
Basophils Relative: 0 %
Eosinophils Absolute: 0 10*3/uL (ref 0.0–0.7)
Eosinophils Relative: 0 %
HCT: 35.8 % — ABNORMAL LOW (ref 39.0–52.0)
Hemoglobin: 12.2 g/dL — ABNORMAL LOW (ref 13.0–17.0)
Lymphocytes Relative: 10 %
Lymphs Abs: 1.3 10*3/uL (ref 0.7–4.0)
MCH: 30 pg (ref 26.0–34.0)
MCHC: 34.1 g/dL (ref 30.0–36.0)
MCV: 88.2 fL (ref 78.0–100.0)
Monocytes Absolute: 1 10*3/uL (ref 0.1–1.0)
Monocytes Relative: 8 %
Neutro Abs: 10.6 10*3/uL — ABNORMAL HIGH (ref 1.7–7.7)
Neutrophils Relative %: 82 %
Platelets: 63 10*3/uL — ABNORMAL LOW (ref 150–400)
RBC: 4.06 MIL/uL — ABNORMAL LOW (ref 4.22–5.81)
RDW: 16.1 % — ABNORMAL HIGH (ref 11.5–15.5)
WBC Morphology: INCREASED
WBC: 12.9 10*3/uL — ABNORMAL HIGH (ref 4.0–10.5)

## 2015-05-09 LAB — PREPARE FRESH FROZEN PLASMA
Unit division: 0
Unit division: 0
Unit division: 0
Unit division: 0
Unit division: 0
Unit division: 0
Unit division: 0
Unit division: 0
Unit division: 0
Unit division: 0

## 2015-05-09 LAB — BASIC METABOLIC PANEL
Anion gap: 5 (ref 5–15)
BUN: 14 mg/dL (ref 6–20)
CO2: 25 mmol/L (ref 22–32)
Calcium: 7.5 mg/dL — ABNORMAL LOW (ref 8.9–10.3)
Chloride: 110 mmol/L (ref 101–111)
Creatinine, Ser: 1.25 mg/dL — ABNORMAL HIGH (ref 0.61–1.24)
GFR calc Af Amer: >60 mL/min (ref >60)
GFR calc non Af Amer: >60 mL/min (ref >60)
Glucose, Bld: 88 mg/dL (ref 65–99)
Potassium: 4.4 mmol/L (ref 3.5–5.1)
Sodium: 140 mmol/L (ref 135–145)

## 2015-05-09 LAB — BLOOD GAS, ARTERIAL
Acid-Base Excess: 0.4 mmol/L (ref 0.0–2.0)
Bicarbonate: 24.4 mEq/L — ABNORMAL HIGH (ref 20.0–24.0)
Drawn by: 405301
FIO2: 0.3
MECHVT: 550 mL
O2 Saturation: 98.8 %
PEEP: 5 cmH2O
Patient temperature: 100.2
RATE: 16 resp/min
TCO2: 25.5 mmol/L (ref 0–100)
pCO2 arterial: 39.9 mmHg (ref 35.0–45.0)
pH, Arterial: 7.407 (ref 7.350–7.450)
pO2, Arterial: 129 mmHg — ABNORMAL HIGH (ref 80.0–100.0)

## 2015-05-09 LAB — PREPARE PLATELET PHERESIS
Unit division: 0
Unit division: 0
Unit division: 0

## 2015-05-09 LAB — PROTIME-INR
INR: 1.4 (ref 0.00–1.49)
Prothrombin Time: 17.3 seconds — ABNORMAL HIGH (ref 11.6–15.2)

## 2015-05-09 LAB — LACTIC ACID, PLASMA: Lactic Acid, Venous: 1.6 mmol/L (ref 0.5–2.0)

## 2015-05-09 LAB — TRIGLYCERIDES: Triglycerides: 112 mg/dL (ref <150)

## 2015-05-09 SURGERY — LAPAROTOMY, EXPLORATORY
Anesthesia: General | Site: Abdomen

## 2015-05-09 MED ORDER — WHITE PETROLATUM GEL
Status: AC
  Filled 2015-05-09: qty 1

## 2015-05-09 MED ORDER — ALBUMIN HUMAN 5 % IV SOLN
INTRAVENOUS | Status: AC
  Filled 2015-05-09: qty 250

## 2015-05-09 MED ORDER — ARTIFICIAL TEARS OP OINT
TOPICAL_OINTMENT | OPHTHALMIC | Status: DC | PRN
  Administered 2015-05-09: 1 via OPHTHALMIC

## 2015-05-09 MED ORDER — PROPOFOL 10 MG/ML IV BOLUS
INTRAVENOUS | Status: AC
Start: 2015-05-09 — End: 2015-05-09
  Filled 2015-05-09: qty 20

## 2015-05-09 MED ORDER — FENTANYL CITRATE (PF) 250 MCG/5ML IJ SOLN
INTRAMUSCULAR | Status: AC
  Filled 2015-05-09: qty 5

## 2015-05-09 MED ORDER — ALBUMIN HUMAN 5 % IV SOLN
25.0000 g | Freq: Once | INTRAVENOUS | Status: AC
  Administered 2015-05-09: 25 g via INTRAVENOUS
  Filled 2015-05-09: qty 250

## 2015-05-09 MED ORDER — VECURONIUM BROMIDE 10 MG IV SOLR
INTRAVENOUS | Status: DC | PRN
  Administered 2015-05-09 (×3): 10 mg via INTRAVENOUS

## 2015-05-09 MED ORDER — MIDAZOLAM HCL 2 MG/2ML IJ SOLN
INTRAMUSCULAR | Status: AC
  Filled 2015-05-09: qty 4

## 2015-05-09 MED ORDER — LACTATED RINGERS IV SOLN
INTRAVENOUS | Status: DC | PRN
  Administered 2015-05-09: 13:00:00 via INTRAVENOUS

## 2015-05-09 MED ORDER — MIDAZOLAM HCL 5 MG/5ML IJ SOLN
INTRAMUSCULAR | Status: DC | PRN
  Administered 2015-05-09: 2 mg via INTRAVENOUS

## 2015-05-09 MED ORDER — PROPOFOL 1000 MG/100ML IV EMUL
0.0000 ug/kg/min | INTRAVENOUS | Status: DC
  Administered 2015-05-09 (×3): 30 ug/kg/min via INTRAVENOUS
  Administered 2015-05-10 (×3): 40 ug/kg/min via INTRAVENOUS
  Administered 2015-05-10 (×2): 30 ug/kg/min via INTRAVENOUS
  Administered 2015-05-10 – 2015-05-11 (×2): 45 ug/kg/min via INTRAVENOUS
  Administered 2015-05-11 (×2): 60 ug/kg/min via INTRAVENOUS
  Administered 2015-05-11: 50 ug/kg/min via INTRAVENOUS
  Administered 2015-05-11 (×4): 60 ug/kg/min via INTRAVENOUS
  Administered 2015-05-11: 45 ug/kg/min via INTRAVENOUS
  Administered 2015-05-12 – 2015-05-13 (×10): 60 ug/kg/min via INTRAVENOUS
  Administered 2015-05-13: 50 ug/kg/min via INTRAVENOUS
  Administered 2015-05-13: 60 ug/kg/min via INTRAVENOUS
  Filled 2015-05-09 (×29): qty 100

## 2015-05-09 MED ORDER — FENTANYL CITRATE (PF) 100 MCG/2ML IJ SOLN
INTRAMUSCULAR | Status: DC | PRN
  Administered 2015-05-09: 150 ug via INTRAVENOUS
  Administered 2015-05-09 (×2): 100 ug via INTRAVENOUS
  Administered 2015-05-09: 150 ug via INTRAVENOUS

## 2015-05-09 MED ORDER — 0.9 % SODIUM CHLORIDE (POUR BTL) OPTIME
TOPICAL | Status: DC | PRN
  Administered 2015-05-09: 3000 mL
  Administered 2015-05-09 (×4): 1000 mL

## 2015-05-09 SURGICAL SUPPLY — 54 items
APL SKNCLS STERI-STRIP NONHPOA (GAUZE/BANDAGES/DRESSINGS) ×2
BENZOIN TINCTURE PRP APPL 2/3 (GAUZE/BANDAGES/DRESSINGS) ×2 IMPLANT
BLADE SURG ROTATE 9660 (MISCELLANEOUS) IMPLANT
CANISTER SUCTION 2500CC (MISCELLANEOUS) ×2 IMPLANT
CHLORAPREP W/TINT 26ML (MISCELLANEOUS) ×1 IMPLANT
COVER MAYO STAND STRL (DRAPES) IMPLANT
COVER SURGICAL LIGHT HANDLE (MISCELLANEOUS) ×2 IMPLANT
DRAPE LAPAROSCOPIC ABDOMINAL (DRAPES) ×2 IMPLANT
DRAPE PROXIMA HALF (DRAPES) IMPLANT
DRAPE UTILITY XL STRL (DRAPES) ×3 IMPLANT
DRAPE WARM FLUID 44X44 (DRAPE) ×2 IMPLANT
DRSG OPSITE POSTOP 4X10 (GAUZE/BANDAGES/DRESSINGS) IMPLANT
DRSG OPSITE POSTOP 4X8 (GAUZE/BANDAGES/DRESSINGS) IMPLANT
ELECT BLADE 6.5 EXT (BLADE) ×1 IMPLANT
ELECT CAUTERY BLADE 6.4 (BLADE) ×3 IMPLANT
ELECT REM PT RETURN 9FT ADLT (ELECTROSURGICAL) ×2
ELECTRODE REM PT RTRN 9FT ADLT (ELECTROSURGICAL) ×1 IMPLANT
GLOVE BIO SURGEON STRL SZ8 (GLOVE) ×3 IMPLANT
GLOVE BIO SURGEON STRL SZ8.5 (GLOVE) ×1 IMPLANT
GLOVE BIOGEL PI IND STRL 6.5 (GLOVE) IMPLANT
GLOVE BIOGEL PI IND STRL 8 (GLOVE) ×1 IMPLANT
GLOVE BIOGEL PI IND STRL 8.5 (GLOVE) IMPLANT
GLOVE BIOGEL PI INDICATOR 6.5 (GLOVE) ×2
GLOVE BIOGEL PI INDICATOR 8 (GLOVE) ×2
GLOVE BIOGEL PI INDICATOR 8.5 (GLOVE) ×1
GLOVE SURG SS PI 6.0 STRL IVOR (GLOVE) ×1 IMPLANT
GOWN STRL REUS W/ TWL LRG LVL3 (GOWN DISPOSABLE) ×2 IMPLANT
GOWN STRL REUS W/ TWL XL LVL3 (GOWN DISPOSABLE) ×1 IMPLANT
GOWN STRL REUS W/TWL LRG LVL3 (GOWN DISPOSABLE) ×4
GOWN STRL REUS W/TWL XL LVL3 (GOWN DISPOSABLE) ×4
KIT BASIN OR (CUSTOM PROCEDURE TRAY) ×2 IMPLANT
KIT ROOM TURNOVER OR (KITS) ×2 IMPLANT
LIGASURE IMPACT 36 18CM CVD LR (INSTRUMENTS) ×1 IMPLANT
NS IRRIG 1000ML POUR BTL (IV SOLUTION) ×5 IMPLANT
PACK GENERAL/GYN (CUSTOM PROCEDURE TRAY) ×2 IMPLANT
PAD ARMBOARD 7.5X6 YLW CONV (MISCELLANEOUS) ×2 IMPLANT
PENCIL BUTTON HOLSTER BLD 10FT (ELECTRODE) ×1 IMPLANT
RELOAD PROXIMATE 75MM BLUE (ENDOMECHANICALS) ×2 IMPLANT
RELOAD STAPLE 75 3.8 BLU REG (ENDOMECHANICALS) IMPLANT
SPECIMEN JAR LARGE (MISCELLANEOUS) IMPLANT
SPONGE ABDOMINAL VAC ABTHERA (MISCELLANEOUS) ×1 IMPLANT
SPONGE LAP 18X18 X RAY DECT (DISPOSABLE) IMPLANT
STAPLER PROXIMATE 75MM BLUE (STAPLE) ×1 IMPLANT
STAPLER VISISTAT 35W (STAPLE) ×2 IMPLANT
SUCTION POOLE TIP (SUCTIONS) ×2 IMPLANT
SUT PDS AB 1 TP1 96 (SUTURE) ×2 IMPLANT
SUT SILK 2 0 SH CR/8 (SUTURE) ×2 IMPLANT
SUT SILK 2 0 TIES 10X30 (SUTURE) ×1 IMPLANT
SUT SILK 3 0 SH CR/8 (SUTURE) ×1 IMPLANT
SUT SILK 3 0 TIES 10X30 (SUTURE) ×1 IMPLANT
TOWEL OR 17X26 10 PK STRL BLUE (TOWEL DISPOSABLE) ×2 IMPLANT
TRAY FOLEY CATH 16FRSI W/METER (SET/KITS/TRAYS/PACK) IMPLANT
TUBE CONNECTING 12X1/4 (SUCTIONS) IMPLANT
YANKAUER SUCT BULB TIP NO VENT (SUCTIONS) IMPLANT
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
## 2015-05-09 NOTE — Progress Notes (Signed)
PT Cancellation Note  Patient Details Name: Raymond Bowen MRN: [id number] DOB: 1975/08/21   Cancelled Treatment:    Reason Eval/Treat Not Completed: Patient not medically ready.  Pt remains on bedrest and noted plan to return to OR today.  Will hold PT at this time and f/u as appropriate.     Raymond Bowen, Alison MurrayMegan F 05/09/2015, 8:39 AM
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
## 2015-05-09 NOTE — Progress Notes (Signed)
   05/09/15 1455  Clinical Encounter Type  Visited With Family  Visit Type Initial  Stress Factors  Family Stress Factors None identified    Chaplain offered support and introduced spiritual care services to the patient's family. Chaplain support available as needed.   Alda PonderAdam M Freddi Schrager, Chaplain 05/09/2015 2:56 PM
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
## 2015-05-09 NOTE — Anesthesia Postprocedure Evaluation (Signed)
  Anesthesia Post-op Note  Patient: Elmon ElseBrian XXXCurrie  Procedure(s) Performed: Procedure(s): EXPLORATORY LAPAROTOMY WITH REMOVAL OF 3 PACKS (N/A) ILEOCOLONIC ANASTOMOSIS RESECTION (N/A) CLOSURE OF ABDOMEN WITH ABDOMINAL WOUND VAC (N/A)  Patient Location: SICU  Anesthesia Type:General  Level of Consciousness: sedated and Patient remains intubated per anesthesia plan  Airway and Oxygen Therapy: Patient remains intubated per anesthesia plan and Patient placed on Ventilator (see vital sign flow sheet for setting)  Post-op Pain: none  Post-op Assessment: Post-op Vital signs reviewed, Patient's Cardiovascular Status Stable, Respiratory Function Stable, Patent Airway, No signs of Nausea or vomiting and Pain level controlled LLE Motor Response: Purposeful movement   RLE Motor Response: Purposeful movement        Post-op Vital Signs: stable  Last Vitals:  Filed Vitals:   05/09/15 1228  BP: 127/65  Pulse: 80  Temp:   Resp: 16    Complications: No apparent anesthesia complications

## 2015-05-09 NOTE — Anesthesia Procedure Notes (Signed)
Date/Time: 05/09/2015 1:24 PM Performed by: Wray KearnsFOLEY, Raymond Pavone A Pre-anesthesia Checklist: Patient identified, Timeout performed, Emergency Drugs available, Suction available and Patient being monitored Patient Re-evaluated:Patient Re-evaluated prior to inductionOxygen Delivery Method: Circle system utilized Preoxygenation: Pre-oxygenation with 100% oxygen Intubation Type: Inhalational induction with existing ETT and Combination inhalational/ intravenous induction Tube type: Oral Placement Confirmation: breath sounds checked- equal and bilateral and positive ETCO2 Dental Injury: Teeth and Oropharynx as per pre-operative assessment

## 2015-05-09 NOTE — Anesthesia Preprocedure Evaluation (Addendum)
Anesthesia Evaluation  Patient identified by MRN, date of birth, ID band Patient unresponsive    Reviewed: Allergy & Precautions, H&P , NPO status , Patient's Chart, lab work & pertinent test results  Airway Mallampati: Intubated       Dental no notable dental hx. (+) Teeth Intact, Dental Advisory Given   Pulmonary neg pulmonary ROS,  On ventilator   Pulmonary exam normal breath sounds clear to auscultation       Cardiovascular negative cardio ROS   Rhythm:Regular Rate:Normal     Neuro/Psych Sedated on ventilator negative neurological ROS  negative psych ROS   GI/Hepatic negative GI ROS, Neg liver ROS,   Endo/Other  negative endocrine ROS  Renal/GU negative Renal ROS  negative genitourinary   Musculoskeletal   Abdominal   Peds  Hematology negative hematology ROS (+)   Anesthesia Other Findings   Reproductive/Obstetrics negative OB ROS                             Anesthesia Physical Anesthesia Plan  ASA: IV  Anesthesia Plan: General   Post-op Pain Management:    Induction: Intravenous  Airway Management Planned: Oral ETT  Additional Equipment:   Intra-op Plan:   Post-operative Plan: Post-operative intubation/ventilation  Informed Consent: I have reviewed the patients History and Physical, chart, labs and discussed the procedure including the risks, benefits and alternatives for the proposed anesthesia with the patient or authorized representative who has indicated his/her understanding and acceptance.   Dental advisory given  Plan Discussed with: CRNA  Anesthesia Plan Comments:         Anesthesia Quick Evaluation

## 2015-05-09 NOTE — Transfer of Care (Signed)
Immediate Anesthesia Transfer of Care Note  Patient: Raymond Bowen  Procedure(s) Performed: Procedure(s): EXPLORATORY LAPAROTOMY WITH REMOVAL OF 3 PACKS (N/A) ILEOCOLONIC ANASTOMOSIS RESECTION (N/A) CLOSURE OF ABDOMEN WITH ABDOMINAL WOUND VAC (N/A)  Patient Location: NICU  Anesthesia Type:General  Level of Consciousness: sedated and Patient remains intubated per anesthesia plan  Airway & Oxygen Therapy: Patient remains intubated per anesthesia plan and Patient placed on Ventilator (see vital sign flow sheet for setting)  Post-op Assessment: Report given to RN and Post -op Vital signs reviewed and stable  Post vital signs: Reviewed and stable  Last Vitals:  Filed Vitals:   05/09/15 1228  BP: 127/65  Pulse: 80  Temp:   Resp: 16    Complications: No apparent anesthesia complications

## 2015-05-10 ENCOUNTER — Inpatient Hospital Stay (HOSPITAL_COMMUNITY): Payer: No Typology Code available for payment source

## 2015-05-10 ENCOUNTER — Encounter (HOSPITAL_COMMUNITY): Payer: Self-pay | Admitting: General Surgery

## 2015-05-10 LAB — BASIC METABOLIC PANEL
Anion gap: 8 (ref 5–15)
BUN: 10 mg/dL (ref 6–20)
CO2: 27 mmol/L (ref 22–32)
Calcium: 7.8 mg/dL — ABNORMAL LOW (ref 8.9–10.3)
Chloride: 107 mmol/L (ref 101–111)
Creatinine, Ser: 1.17 mg/dL (ref 0.61–1.24)
GFR calc Af Amer: >60 mL/min (ref >60)
GFR calc non Af Amer: >60 mL/min (ref >60)
Glucose, Bld: 101 mg/dL — ABNORMAL HIGH (ref 65–99)
Potassium: 3.8 mmol/L (ref 3.5–5.1)
Sodium: 142 mmol/L (ref 135–145)

## 2015-05-10 LAB — BLOOD GAS, ARTERIAL
Acid-Base Excess: 0.3 mmol/L (ref 0.0–2.0)
Bicarbonate: 24 mEq/L (ref 20.0–24.0)
Drawn by: 43752
FIO2: 0.3
MECHVT: 550 mL
O2 Saturation: 98.8 %
PEEP: 5 cmH2O
Patient temperature: 100.3
RATE: 16 resp/min
TCO2: 25.1 mmol/L (ref 0–100)
pCO2 arterial: 38.1 mmHg (ref 35.0–45.0)
pH, Arterial: 7.421 (ref 7.350–7.450)
pO2, Arterial: 123 mmHg — ABNORMAL HIGH (ref 80.0–100.0)

## 2015-05-10 LAB — CBC
HCT: 32.6 % — ABNORMAL LOW (ref 39.0–52.0)
Hemoglobin: 10.6 g/dL — ABNORMAL LOW (ref 13.0–17.0)
MCH: 29.4 pg (ref 26.0–34.0)
MCHC: 32.5 g/dL (ref 30.0–36.0)
MCV: 90.3 fL (ref 78.0–100.0)
Platelets: 60 10*3/uL — ABNORMAL LOW (ref 150–400)
RBC: 3.61 MIL/uL — ABNORMAL LOW (ref 4.22–5.81)
RDW: 16.1 % — ABNORMAL HIGH (ref 11.5–15.5)
WBC: 11.5 10*3/uL — ABNORMAL HIGH (ref 4.0–10.5)

## 2015-05-10 LAB — GLUCOSE, CAPILLARY: Glucose-Capillary: 92 mg/dL (ref 65–99)

## 2015-05-10 MED ORDER — FUROSEMIDE 10 MG/ML IJ SOLN
40.0000 mg | Freq: Once | INTRAMUSCULAR | Status: AC
  Administered 2015-05-10: 40 mg via INTRAVENOUS
  Filled 2015-05-10: qty 4
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
## 2015-05-11 ENCOUNTER — Inpatient Hospital Stay (HOSPITAL_COMMUNITY): Payer: No Typology Code available for payment source

## 2015-05-11 ENCOUNTER — Encounter (HOSPITAL_COMMUNITY): Payer: Self-pay | Admitting: Certified Registered"

## 2015-05-11 ENCOUNTER — Inpatient Hospital Stay (HOSPITAL_COMMUNITY): Payer: No Typology Code available for payment source | Admitting: Certified Registered"

## 2015-05-11 ENCOUNTER — Encounter (HOSPITAL_COMMUNITY): Admission: EM | Disposition: A | Discharge status: Rehabilitation Facility | Payer: Self-pay | Source: Home / Self Care

## 2015-05-11 HISTORY — PX: ILEOSTOMY: SHX1783

## 2015-05-11 HISTORY — PX: LAPAROTOMY: SHX154

## 2015-05-11 LAB — BASIC METABOLIC PANEL
Anion gap: 8 (ref 5–15)
BUN: 8 mg/dL (ref 6–20)
CO2: 25 mmol/L (ref 22–32)
Calcium: 7.7 mg/dL — ABNORMAL LOW (ref 8.9–10.3)
Chloride: 109 mmol/L (ref 101–111)
Creatinine, Ser: 1.06 mg/dL (ref 0.61–1.24)
GFR calc Af Amer: >60 mL/min (ref >60)
GFR calc non Af Amer: >60 mL/min (ref >60)
Glucose, Bld: 86 mg/dL (ref 65–99)
Potassium: 3.2 mmol/L — ABNORMAL LOW (ref 3.5–5.1)
Sodium: 142 mmol/L (ref 135–145)

## 2015-05-11 LAB — CBC WITH DIFFERENTIAL/PLATELET
Basophils Absolute: 0 10*3/uL (ref 0.0–0.1)
Basophils Relative: 0 %
Eosinophils Absolute: 0.3 10*3/uL (ref 0.0–0.7)
Eosinophils Relative: 3 %
HCT: 34 % — ABNORMAL LOW (ref 39.0–52.0)
Hemoglobin: 11 g/dL — ABNORMAL LOW (ref 13.0–17.0)
Lymphocytes Relative: 10 %
Lymphs Abs: 1.2 10*3/uL (ref 0.7–4.0)
MCH: 29.4 pg (ref 26.0–34.0)
MCHC: 32.4 g/dL (ref 30.0–36.0)
MCV: 90.9 fL (ref 78.0–100.0)
Monocytes Absolute: 1.6 10*3/uL — ABNORMAL HIGH (ref 0.1–1.0)
Monocytes Relative: 14 %
Neutro Abs: 8.3 10*3/uL — ABNORMAL HIGH (ref 1.7–7.7)
Neutrophils Relative %: 73 %
Platelets: 80 10*3/uL — ABNORMAL LOW (ref 150–400)
RBC: 3.74 MIL/uL — ABNORMAL LOW (ref 4.22–5.81)
RDW: 15.9 % — ABNORMAL HIGH (ref 11.5–15.5)
WBC: 11.3 10*3/uL — ABNORMAL HIGH (ref 4.0–10.5)

## 2015-05-11 LAB — TYPE AND SCREEN
ABO/RH(D): O POS
Antibody Screen: NEGATIVE

## 2015-05-11 SURGERY — LAPAROTOMY, EXPLORATORY
Anesthesia: General | Site: Abdomen

## 2015-05-11 MED ORDER — POTASSIUM CHLORIDE 10 MEQ/50ML IV SOLN
10.0000 meq | INTRAVENOUS | Status: AC
  Administered 2015-05-11 (×3): 10 meq via INTRAVENOUS
  Filled 2015-05-11 (×3): qty 50

## 2015-05-11 MED ORDER — ROCURONIUM BROMIDE 50 MG/5ML IV SOLN
INTRAVENOUS | Status: AC
  Filled 2015-05-11: qty 1

## 2015-05-11 MED ORDER — FENTANYL CITRATE (PF) 100 MCG/2ML IJ SOLN
INTRAMUSCULAR | Status: DC | PRN
  Administered 2015-05-11: 50 ug via INTRAVENOUS
  Administered 2015-05-11 (×2): 100 ug via INTRAVENOUS

## 2015-05-11 MED ORDER — ROCURONIUM BROMIDE 100 MG/10ML IV SOLN
INTRAVENOUS | Status: DC | PRN
  Administered 2015-05-11: 30 mg via INTRAVENOUS
  Administered 2015-05-11: 50 mg via INTRAVENOUS
  Administered 2015-05-11 (×2): 10 mg via INTRAVENOUS

## 2015-05-11 MED ORDER — EPHEDRINE SULFATE 50 MG/ML IJ SOLN
INTRAMUSCULAR | Status: AC
  Filled 2015-05-11: qty 1

## 2015-05-11 MED ORDER — 0.9 % SODIUM CHLORIDE (POUR BTL) OPTIME
TOPICAL | Status: DC | PRN
  Administered 2015-05-11 (×5): 1000 mL

## 2015-05-11 MED ORDER — CEFAZOLIN SODIUM-DEXTROSE 2-3 GM-% IV SOLR
INTRAVENOUS | Status: DC | PRN
  Administered 2015-05-11: 2 g via INTRAVENOUS

## 2015-05-11 MED ORDER — SODIUM CHLORIDE 0.9 % IJ SOLN
INTRAMUSCULAR | Status: AC
  Filled 2015-05-11: qty 10

## 2015-05-11 MED ORDER — FENTANYL CITRATE (PF) 250 MCG/5ML IJ SOLN
INTRAMUSCULAR | Status: AC
  Filled 2015-05-11: qty 5

## 2015-05-11 MED ORDER — CEFAZOLIN SODIUM-DEXTROSE 2-3 GM-% IV SOLR
INTRAVENOUS | Status: AC
  Filled 2015-05-11: qty 50

## 2015-05-11 MED ORDER — LACTATED RINGERS IV SOLN
INTRAVENOUS | Status: DC | PRN
  Administered 2015-05-11: 13:00:00 via INTRAVENOUS

## 2015-05-11 SURGICAL SUPPLY — 58 items
APL SKNCLS STERI-STRIP NONHPOA (GAUZE/BANDAGES/DRESSINGS) ×2
BENZOIN TINCTURE PRP APPL 2/3 (GAUZE/BANDAGES/DRESSINGS) ×1 IMPLANT
BLADE SURG ROTATE 9660 (MISCELLANEOUS) ×1 IMPLANT
CANISTER SUCTION 2500CC (MISCELLANEOUS) ×4 IMPLANT
CANISTER WOUND CARE 500ML ATS (WOUND CARE) ×1 IMPLANT
CHLORAPREP W/TINT 26ML (MISCELLANEOUS) ×2 IMPLANT
COVER MAYO STAND STRL (DRAPES) IMPLANT
COVER SURGICAL LIGHT HANDLE (MISCELLANEOUS) ×3 IMPLANT
DRAPE LAPAROSCOPIC ABDOMINAL (DRAPES) ×3 IMPLANT
DRAPE PROXIMA HALF (DRAPES) IMPLANT
DRAPE UTILITY XL STRL (DRAPES) ×4 IMPLANT
DRAPE WARM FLUID 44X44 (DRAPE) ×3 IMPLANT
DRSG OPSITE POSTOP 4X10 (GAUZE/BANDAGES/DRESSINGS) IMPLANT
DRSG OPSITE POSTOP 4X8 (GAUZE/BANDAGES/DRESSINGS) IMPLANT
DRSG VAC ATS MED SENSATRAC (GAUZE/BANDAGES/DRESSINGS) ×1 IMPLANT
ELECT BLADE 6.5 EXT (BLADE) ×1 IMPLANT
ELECT CAUTERY BLADE 6.4 (BLADE) ×5 IMPLANT
ELECT REM PT RETURN 9FT ADLT (ELECTROSURGICAL) ×3
ELECTRODE REM PT RTRN 9FT ADLT (ELECTROSURGICAL) ×2 IMPLANT
GLOVE BIO SURGEON STRL SZ8 (GLOVE) ×4 IMPLANT
GLOVE BIOGEL PI IND STRL 6.5 (GLOVE) IMPLANT
GLOVE BIOGEL PI IND STRL 7.0 (GLOVE) IMPLANT
GLOVE BIOGEL PI IND STRL 8 (GLOVE) ×2 IMPLANT
GLOVE BIOGEL PI INDICATOR 6.5 (GLOVE) ×1
GLOVE BIOGEL PI INDICATOR 7.0 (GLOVE) ×4
GLOVE BIOGEL PI INDICATOR 8 (GLOVE) ×3
GLOVE SURG SS PI 7.0 STRL IVOR (GLOVE) ×2 IMPLANT
GOWN STRL REUS W/ TWL LRG LVL3 (GOWN DISPOSABLE) ×4 IMPLANT
GOWN STRL REUS W/ TWL XL LVL3 (GOWN DISPOSABLE) ×2 IMPLANT
GOWN STRL REUS W/TWL LRG LVL3 (GOWN DISPOSABLE) ×9
GOWN STRL REUS W/TWL XL LVL3 (GOWN DISPOSABLE) ×3
KIT BASIN OR (CUSTOM PROCEDURE TRAY) ×3 IMPLANT
KIT OSTOMY DRAINABLE 2.75 STR (WOUND CARE) ×1 IMPLANT
KIT ROOM TURNOVER OR (KITS) ×3 IMPLANT
LIGASURE IMPACT 36 18CM CVD LR (INSTRUMENTS) ×1 IMPLANT
NS IRRIG 1000ML POUR BTL (IV SOLUTION) ×9 IMPLANT
PACK GENERAL/GYN (CUSTOM PROCEDURE TRAY) ×3 IMPLANT
PAD ARMBOARD 7.5X6 YLW CONV (MISCELLANEOUS) ×3 IMPLANT
PENCIL BUTTON HOLSTER BLD 10FT (ELECTRODE) IMPLANT
SPECIMEN JAR LARGE (MISCELLANEOUS) IMPLANT
SPONGE LAP 18X18 X RAY DECT (DISPOSABLE) IMPLANT
STAPLER PROXIMATE 75MM BLUE (STAPLE) ×1 IMPLANT
STAPLER VISISTAT 35W (STAPLE) ×3 IMPLANT
SUCTION POOLE TIP (SUCTIONS) ×3 IMPLANT
SUT NOVA 1 T20/GS 25DT (SUTURE) ×4 IMPLANT
SUT PDS AB 1 TP1 96 (SUTURE) ×4 IMPLANT
SUT SILK 2 0 SH CR/8 (SUTURE) ×3 IMPLANT
SUT SILK 2 0 TIES 10X30 (SUTURE) ×3 IMPLANT
SUT SILK 3 0 SH CR/8 (SUTURE) ×3 IMPLANT
SUT SILK 3 0 TIES 10X30 (SUTURE) ×3 IMPLANT
SUT VIC AB 2-0 SH 27 (SUTURE) ×6
SUT VIC AB 2-0 SH 27X BRD (SUTURE) IMPLANT
SUT VIC AB 2-0 SH 27XBRD (SUTURE) IMPLANT
SUT VIC AB 3-0 SH 18 (SUTURE) ×1 IMPLANT
TOWEL OR 17X26 10 PK STRL BLUE (TOWEL DISPOSABLE) ×3 IMPLANT
TRAY FOLEY CATH 16FRSI W/METER (SET/KITS/TRAYS/PACK) IMPLANT
TUBE CONNECTING 12X1/4 (SUCTIONS) IMPLANT
YANKAUER SUCT BULB TIP NO VENT (SUCTIONS) IMPLANT

## 2015-05-11 NOTE — Progress Notes (Signed)
End Tidal CO2 device changed due to it being occluded. RN aware.

## 2015-05-11 NOTE — Transfer of Care (Signed)
Immediate Anesthesia Transfer of Care Note  Patient: Raymond Bowen  Procedure(s) Performed: Procedure(s): EXPLORATORY LAPAROTOMY;REMOVAL OF PACK; PLACEMENT OF NEGATIVE PRESSURE WOUND VAC (N/A) ILEOSTOMY (N/A)  Patient Location: ICU  Anesthesia Type:General  Level of Consciousness: Patient remains intubated per anesthesia plan  Airway & Oxygen Therapy: Patient remains intubated per anesthesia plan and Patient placed on Ventilator (see vital sign flow sheet for setting)  Post-op Assessment: Report given to RN  Post vital signs: Reviewed and stable  Last Vitals:  Filed Vitals:   05/11/15 1115  BP:   Pulse: 94  Temp:   Resp: 16    Complications: No apparent anesthesia complications

## 2015-05-11 NOTE — Progress Notes (Signed)
Patient back from OR.

## 2015-05-11 NOTE — Anesthesia Postprocedure Evaluation (Signed)
  Anesthesia Post-op Note  Patient: Raymond Bowen  Procedure(s) Performed: Procedure(s): EXPLORATORY LAPAROTOMY;REMOVAL OF PACK; PLACEMENT OF NEGATIVE PRESSURE WOUND VAC (N/A) ILEOSTOMY (N/A)  Patient Location: ICU  Anesthesia Type:General  Level of Consciousness: Patient remains intubated per anesthesia plan  Airway and Oxygen Therapy: Patient remains intubated per anesthesia plan  Post-op Pain: none  Post-op Assessment: Post-op Vital signs reviewed, Patient's Cardiovascular Status Stable, Respiratory Function Stable, Patent Airway and Pain level controlled LLE Motor Response: Purposeful movement   RLE Motor Response: Purposeful movement        Post-op Vital Signs: Reviewed and stable  Last Vitals:  Filed Vitals:   05/11/15 1115  BP:   Pulse: 94  Temp:   Resp: 16    Complications: No apparent anesthesia complications
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
## 2015-05-11 NOTE — Anesthesia Procedure Notes (Signed)
Date/Time: 05/11/2015 11:30 AM Performed by: De NurseENNIE, Luismiguel Lamere E Pre-anesthesia Checklist: Patient identified, Emergency Drugs available, Suction available, Patient being monitored and Timeout performed Patient Re-evaluated:Patient Re-evaluated prior to inductionPreoxygenation: Pre-oxygenation with 100% oxygen Intubation Type: Inhalational induction with existing ETT

## 2015-05-11 NOTE — Anesthesia Preprocedure Evaluation (Addendum)
Anesthesia Evaluation  Patient identified by MRN, date of birth, ID band  Reviewed: Allergy & Precautions, H&P , NPO status , Patient's Chart, lab work & pertinent test results, Unable to perform ROS - Chart review only  Airway Mallampati: Intubated       Dental no notable dental hx. (+) Teeth Intact   Pulmonary neg pulmonary ROS,  On ventilator   Pulmonary exam normal breath sounds clear to auscultation       Cardiovascular negative cardio ROS   Rhythm:Regular Rate:Normal     Neuro/Psych Sedated on ventilator negative neurological ROS  negative psych ROS   GI/Hepatic negative GI ROS, Neg liver ROS,   Endo/Other  negative endocrine ROS  Renal/GU negative Renal ROS  negative genitourinary   Musculoskeletal   Abdominal   Peds  Hematology negative hematology ROS (+)   Anesthesia Other Findings   Reproductive/Obstetrics negative OB ROS                           Anesthesia Physical  Anesthesia Plan  ASA: IV  Anesthesia Plan: General   Post-op Pain Management:    Induction: Intravenous  Airway Management Planned: Oral ETT  Additional Equipment:   Intra-op Plan:   Post-operative Plan: Post-operative intubation/ventilation  Informed Consent: I have reviewed the patients History and Physical, chart, labs and discussed the procedure including the risks, benefits and alternatives for the proposed anesthesia with the patient or authorized representative who has indicated his/her understanding and acceptance.     Plan Discussed with: CRNA and Anesthesiologist  Anesthesia Plan Comments:        Anesthesia Quick Evaluation

## 2015-05-12 ENCOUNTER — Inpatient Hospital Stay (HOSPITAL_COMMUNITY): Payer: No Typology Code available for payment source

## 2015-05-12 ENCOUNTER — Encounter (HOSPITAL_COMMUNITY): Payer: Self-pay | Admitting: General Surgery

## 2015-05-12 LAB — COMPREHENSIVE METABOLIC PANEL
ALT: 52 U/L (ref 17–63)
AST: 132 U/L — ABNORMAL HIGH (ref 15–41)
Albumin: 1.6 g/dL — ABNORMAL LOW (ref 3.5–5.0)
Alkaline Phosphatase: 44 U/L (ref 38–126)
Anion gap: 8 (ref 5–15)
BUN: 9 mg/dL (ref 6–20)
CO2: 24 mmol/L (ref 22–32)
Calcium: 7.3 mg/dL — ABNORMAL LOW (ref 8.9–10.3)
Chloride: 107 mmol/L (ref 101–111)
Creatinine, Ser: 0.91 mg/dL (ref 0.61–1.24)
GFR calc Af Amer: >60 mL/min (ref >60)
GFR calc non Af Amer: >60 mL/min (ref >60)
Glucose, Bld: 99 mg/dL (ref 65–99)
Potassium: 3.5 mmol/L (ref 3.5–5.1)
Sodium: 139 mmol/L (ref 135–145)
Total Bilirubin: 1.4 mg/dL — ABNORMAL HIGH (ref 0.3–1.2)
Total Protein: 4.8 g/dL — ABNORMAL LOW (ref 6.5–8.1)

## 2015-05-12 LAB — CBC
HCT: 33.5 % — ABNORMAL LOW (ref 39.0–52.0)
Hemoglobin: 10.8 g/dL — ABNORMAL LOW (ref 13.0–17.0)
MCH: 29.5 pg (ref 26.0–34.0)
MCHC: 32.2 g/dL (ref 30.0–36.0)
MCV: 91.5 fL (ref 78.0–100.0)
Platelets: 105 10*3/uL — ABNORMAL LOW (ref 150–400)
RBC: 3.66 MIL/uL — ABNORMAL LOW (ref 4.22–5.81)
RDW: 16 % — ABNORMAL HIGH (ref 11.5–15.5)
WBC: 12.4 10*3/uL — ABNORMAL HIGH (ref 4.0–10.5)

## 2015-05-12 LAB — TRIGLYCERIDES: Triglycerides: 906 mg/dL — ABNORMAL HIGH (ref <150)

## 2015-05-12 NOTE — Consult Note (Signed)
WOC ostomy consult note Stoma type/location: RLQ, end ileostomy Stomal assessment/size: 1 1/4" approximately, visualized thru pouch.  Placed in OR yesterday at the time of the procedure.  Pink, moist Peristomal assessment: NA Treatment options for stomal/peristomal skin: NA Output liquid brown stool, flatus Ostomy pouching: 2pc. 2 3/4" in place Education provided: patient sedated on the vent.  No family in the room at the time of my visit. Enrolled patient in [employer] program: No  Supplies ordered to downsize to 2 1/4" pouch in order to avoid placement of wafer over NPWT dressing.  Will need to have pouch and NPWT dressing changed at the same time Saturday due to the pouch currently overlaps the NPWT drape.  Bedside staff aware Saturday change of each.    WOC will follow along with you for support with ostomy care and teaching when appropriate.  Omri Bertran [place] RN, [place] [phone]

## 2015-05-12 NOTE — Progress Notes (Signed)
Ostomy RN in to see patient. Stated for Wound Vac dressing and Ostomy dressing to be changed on Saturday. Dressing supplies to be sent up per ostomy nurse.
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
## 2015-05-13 ENCOUNTER — Inpatient Hospital Stay (HOSPITAL_COMMUNITY): Payer: No Typology Code available for payment source

## 2015-05-13 MED ORDER — FENTANYL CITRATE (PF) 100 MCG/2ML IJ SOLN
25.0000 ug | INTRAMUSCULAR | Status: DC | PRN
  Administered 2015-05-13: 25 ug via INTRAVENOUS
  Administered 2015-05-14 – 2015-05-16 (×25): 75 ug via INTRAVENOUS
  Filled 2015-05-13 (×26): qty 2

## 2015-05-13 MED ORDER — CHLORHEXIDINE GLUCONATE 0.12 % MT SOLN
15.0000 mL | Freq: Two times a day (BID) | OROMUCOSAL | Status: DC
  Administered 2015-05-13 – 2015-05-16 (×6): 15 mL via OROMUCOSAL
  Filled 2015-05-13: qty 15

## 2015-05-13 MED ORDER — CETYLPYRIDINIUM CHLORIDE 0.05 % MT LIQD
7.0000 mL | Freq: Two times a day (BID) | OROMUCOSAL | Status: DC
  Administered 2015-05-13 – 2015-05-16 (×6): 7 mL via OROMUCOSAL

## 2015-05-13 MED ORDER — FUROSEMIDE 10 MG/ML IJ SOLN
40.0000 mg | Freq: Once | INTRAMUSCULAR | Status: AC
  Administered 2015-05-13: 40 mg via INTRAVENOUS
  Filled 2015-05-13: qty 4

## 2015-05-13 NOTE — Procedures (Signed)
Extubation Procedure Note  Patient Details:   Name: Raymond Bowen DOB: June 01, 1976 MRN: [id number]   Airway Documentation:     Evaluation  O2 sats: stable throughout Complications: No apparent complications Patient did tolerate procedure well. Bilateral Breath Sounds: Rhonchi Suctioning: Airway Yes   Pt. Was extubated to a 2L [place] with RN at the bedside without any complications, dyspnea or stridor noted. Pt. Is doing well at this time & all vitals are within normal limits.  Jola Critzer L 05/13/2015, 11:30, AM
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
## 2015-05-14 ENCOUNTER — Inpatient Hospital Stay (HOSPITAL_COMMUNITY): Payer: No Typology Code available for payment source

## 2015-05-14 LAB — URINALYSIS, ROUTINE W REFLEX MICROSCOPIC
Glucose, UA: NEGATIVE mg/dL
Ketones, ur: 15 mg/dL — AB
Nitrite: POSITIVE — AB
Protein, ur: 100 mg/dL — AB
Specific Gravity, Urine: 1.034 — ABNORMAL HIGH (ref 1.005–1.030)
Urobilinogen, UA: 1 mg/dL (ref 0.0–1.0)
pH: 6 (ref 5.0–8.0)

## 2015-05-14 LAB — BASIC METABOLIC PANEL
Anion gap: 9 (ref 5–15)
BUN: 12 mg/dL (ref 6–20)
CO2: 26 mmol/L (ref 22–32)
Calcium: 8 mg/dL — ABNORMAL LOW (ref 8.9–10.3)
Chloride: 106 mmol/L (ref 101–111)
Creatinine, Ser: 0.93 mg/dL (ref 0.61–1.24)
GFR calc Af Amer: >60 mL/min (ref >60)
GFR calc non Af Amer: >60 mL/min (ref >60)
Glucose, Bld: 125 mg/dL — ABNORMAL HIGH (ref 65–99)
Potassium: 3.4 mmol/L — ABNORMAL LOW (ref 3.5–5.1)
Sodium: 141 mmol/L (ref 135–145)

## 2015-05-14 LAB — CBC
HCT: 30.4 % — ABNORMAL LOW (ref 39.0–52.0)
Hemoglobin: 10.2 g/dL — ABNORMAL LOW (ref 13.0–17.0)
MCH: 30.3 pg (ref 26.0–34.0)
MCHC: 33.6 g/dL (ref 30.0–36.0)
MCV: 90.2 fL (ref 78.0–100.0)
Platelets: 253 10*3/uL (ref 150–400)
RBC: 3.37 MIL/uL — ABNORMAL LOW (ref 4.22–5.81)
RDW: 16 % — ABNORMAL HIGH (ref 11.5–15.5)
WBC: 25.8 10*3/uL — ABNORMAL HIGH (ref 4.0–10.5)

## 2015-05-14 LAB — URINE MICROSCOPIC-ADD ON

## 2015-05-14 LAB — MAGNESIUM: Magnesium: 2.1 mg/dL (ref 1.7–2.4)

## 2015-05-14 MED ORDER — LORAZEPAM 2 MG/ML IJ SOLN
0.5000 mg | Freq: Once | INTRAMUSCULAR | Status: AC
  Administered 2015-05-14: 0.5 mg via INTRAVENOUS

## 2015-05-14 MED ORDER — ENOXAPARIN SODIUM 40 MG/0.4ML ~~LOC~~ SOLN
40.0000 mg | SUBCUTANEOUS | Status: DC
  Administered 2015-05-14 – 2015-05-16 (×3): 40 mg via SUBCUTANEOUS
  Filled 2015-05-14 (×3): qty 0.4

## 2015-05-14 MED ORDER — LORAZEPAM 2 MG/ML IJ SOLN
INTRAMUSCULAR | Status: AC
  Filled 2015-05-14: qty 1

## 2015-05-14 MED ORDER — LORAZEPAM 2 MG/ML IJ SOLN
0.5000 mg | Freq: Once | INTRAMUSCULAR | Status: AC
Start: 2015-05-14 — End: 2015-05-14
  Administered 2015-05-14: 0.5 mg via INTRAVENOUS
  Filled 2015-05-14: qty 1
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
## 2015-05-14 NOTE — Progress Notes (Signed)
Patient anxious and agitated banging on the side rail saying he can't get comfortable in the bed. Fentanyl given with no improvement in comfort. Notified Dr Derrell Lollingamirez. Order received for ativan x 1 dose.  Will continue to monitor.  Taneil Lazarus, [place], RN 05/14/2015 2145

## 2015-05-15 ENCOUNTER — Inpatient Hospital Stay (HOSPITAL_COMMUNITY): Payer: No Typology Code available for payment source

## 2015-05-15 DIAGNOSIS — T148 Other injury of unspecified body region: Secondary | ICD-10-CM

## 2015-05-15 DIAGNOSIS — S35516A Injury of unspecified iliac vein, initial encounter: Secondary | ICD-10-CM

## 2015-05-15 DIAGNOSIS — G8918 Other acute postprocedural pain: Secondary | ICD-10-CM

## 2015-05-15 DIAGNOSIS — R269 Unspecified abnormalities of gait and mobility: Secondary | ICD-10-CM

## 2015-05-15 DIAGNOSIS — Z9049 Acquired absence of other specified parts of digestive tract: Secondary | ICD-10-CM

## 2015-05-15 DIAGNOSIS — D62 Acute posthemorrhagic anemia: Secondary | ICD-10-CM

## 2015-05-15 LAB — URINE CULTURE: Culture: NO GROWTH

## 2015-05-15 LAB — PREALBUMIN: Prealbumin: 4.8 mg/dL — ABNORMAL LOW (ref 18–38)

## 2015-05-15 LAB — BASIC METABOLIC PANEL
Anion gap: 10 (ref 5–15)
BUN: 20 mg/dL (ref 6–20)
CO2: 28 mmol/L (ref 22–32)
Calcium: 8.6 mg/dL — ABNORMAL LOW (ref 8.9–10.3)
Chloride: 114 mmol/L — ABNORMAL HIGH (ref 101–111)
Creatinine, Ser: 0.91 mg/dL (ref 0.61–1.24)
GFR calc Af Amer: >60 mL/min (ref >60)
GFR calc non Af Amer: >60 mL/min (ref >60)
Glucose, Bld: 125 mg/dL — ABNORMAL HIGH (ref 65–99)
Potassium: 3.7 mmol/L (ref 3.5–5.1)
Sodium: 152 mmol/L — ABNORMAL HIGH (ref 135–145)

## 2015-05-15 LAB — EXPECTORATED SPUTUM ASSESSMENT W GRAM STAIN, RFLX TO RESP C: Special Requests: NORMAL

## 2015-05-15 LAB — CBC
HCT: 28.4 % — ABNORMAL LOW (ref 39.0–52.0)
Hemoglobin: 9.5 g/dL — ABNORMAL LOW (ref 13.0–17.0)
MCH: 30.1 pg (ref 26.0–34.0)
MCHC: 33.5 g/dL (ref 30.0–36.0)
MCV: 89.9 fL (ref 78.0–100.0)
Platelets: 310 10*3/uL (ref 150–400)
RBC: 3.16 MIL/uL — ABNORMAL LOW (ref 4.22–5.81)
RDW: 16.1 % — ABNORMAL HIGH (ref 11.5–15.5)
WBC: 30.8 10*3/uL — ABNORMAL HIGH (ref 4.0–10.5)

## 2015-05-15 LAB — EXPECTORATED SPUTUM ASSESSMENT W REFEX TO RESP CULTURE

## 2015-05-15 MED ORDER — PIPERACILLIN-TAZOBACTAM 3.375 G IVPB
3.3750 g | Freq: Three times a day (TID) | INTRAVENOUS | Status: DC
  Administered 2015-05-15 – 2015-05-17 (×8): 3.375 g via INTRAVENOUS
  Filled 2015-05-15 (×9): qty 50

## 2015-05-15 MED ORDER — FAT EMULSION 20 % IV EMUL
250.0000 mL | INTRAVENOUS | Status: AC
  Administered 2015-05-15: 250 mL via INTRAVENOUS
  Filled 2015-05-15: qty 250

## 2015-05-15 MED ORDER — DEXTROSE-NACL 5-0.2 % IV SOLN
INTRAVENOUS | Status: DC
  Administered 2015-05-15: 21:00:00 via INTRAVENOUS
  Filled 2015-05-15 (×3): qty 1000

## 2015-05-15 MED ORDER — INSULIN ASPART 100 UNIT/ML ~~LOC~~ SOLN
0.0000 [IU] | Freq: Four times a day (QID) | SUBCUTANEOUS | Status: DC
  Administered 2015-05-16 – 2015-05-17 (×4): 1 [IU] via SUBCUTANEOUS
  Administered 2015-05-17: 2 [IU] via SUBCUTANEOUS
  Administered 2015-05-17 – 2015-05-18 (×2): 1 [IU] via SUBCUTANEOUS
  Administered 2015-05-18: 2 [IU] via SUBCUTANEOUS

## 2015-05-15 MED ORDER — SODIUM CHLORIDE 0.9 % IJ SOLN
10.0000 mL | Freq: Two times a day (BID) | INTRAMUSCULAR | Status: DC
  Administered 2015-05-15: 10 mL
  Administered 2015-05-15: 20 mL
  Administered 2015-05-16 – 2015-05-24 (×13): 10 mL
  Administered 2015-05-25: 30 mL
  Administered 2015-05-26 – 2015-05-28 (×3): 10 mL

## 2015-05-15 MED ORDER — IPRATROPIUM-ALBUTEROL 0.5-2.5 (3) MG/3ML IN SOLN
RESPIRATORY_TRACT | Status: AC
  Filled 2015-05-15: qty 3

## 2015-05-15 MED ORDER — SODIUM CHLORIDE 0.9 % IJ SOLN: 10.0000 mL | INTRAMUSCULAR | Status: DC | PRN

## 2015-05-15 MED ORDER — IPRATROPIUM-ALBUTEROL 0.5-2.5 (3) MG/3ML IN SOLN
3.0000 mL | RESPIRATORY_TRACT | Status: DC
  Administered 2015-05-15 – 2015-05-16 (×8): 3 mL via RESPIRATORY_TRACT
  Filled 2015-05-15 (×7): qty 3

## 2015-05-15 MED ORDER — SODIUM CHLORIDE 0.9 % IV SOLN: INTRAVENOUS | Status: DC

## 2015-05-15 MED ORDER — TRACE MINERALS CR-CU-MN-SE-ZN 10-1000-500-60 MCG/ML IV SOLN
INTRAVENOUS | Status: AC
  Administered 2015-05-15: 18:00:00 via INTRAVENOUS
  Filled 2015-05-15 (×2): qty 1000
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
## 2015-05-15 NOTE — Progress Notes (Signed)
OT Cancellation Note  Patient Details Name: Elmon ElseBrian XXXCurrie MRN: [id number] DOB: [birth year]   Cancelled Treatment:    Reason Eval/Treat Not Completed: Patient at procedure or test/ unavailable. Attempted to see pt. Having PICC line placed. Will return this pm if able.   [facility] ClinicWARD,HILLARY  Aiman Noe, OTR/L  [phone] 05/15/2015 05/15/2015, 2:07 PM
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
## 2015-05-15 NOTE — Progress Notes (Signed)
Peripherally Inserted Central Catheter/Midline Placement  The IV Nurse has discussed with the patient and/or persons authorized to consent for the patient, the purpose of this procedure and the potential benefits and risks involved with this procedure.  The benefits include less needle sticks, lab draws from the catheter and patient may be discharged home with the catheter.  Risks include, but not limited to, infection, bleeding, blood clot (thrombus formation), and puncture of an artery; nerve damage and irregular heat beat.  Alternatives to this procedure were also discussed.  PICC/Midline Placement Documentation  PICC / Midline Double Lumen 05/15/15 PICC Right Brachial 45 cm 3 cm (Active)  Indication for Insertion or Continuance of Line Administration of hyperosmolar/irritating solutions (i.e. TPN, Vancomycin, etc.) 05/15/2015 12:00 PM  Exposed Catheter (cm) 3 cm 05/15/2015 12:00 PM  Dressing Change Due 05/22/15 05/15/2015 12:00 PM       Raymond Bowen, Raymond Bowen 05/15/2015, 12:02 PM
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
## 2015-05-15 NOTE — Progress Notes (Signed)
Rehab Admissions Coordinator Note:  Patient was screened by Trish MageLogue, Leeloo Silverthorne M for appropriateness for an Inpatient Acute Rehab Consult.  At this time, we are recommending Inpatient Rehab consult.  Trish MageLogue, Mandy Fitzwater M 05/15/2015, 11:31 AM  I can be reached at [phone].

## 2015-05-16 ENCOUNTER — Inpatient Hospital Stay (HOSPITAL_COMMUNITY): Payer: No Typology Code available for payment source | Admitting: Anesthesiology

## 2015-05-16 ENCOUNTER — Encounter (HOSPITAL_COMMUNITY): Admission: EM | Disposition: A | Discharge status: Rehabilitation Facility | Payer: Self-pay | Source: Home / Self Care

## 2015-05-16 ENCOUNTER — Inpatient Hospital Stay (HOSPITAL_COMMUNITY): Payer: No Typology Code available for payment source

## 2015-05-16 DIAGNOSIS — S35516A Injury of unspecified iliac vein, initial encounter: Secondary | ICD-10-CM

## 2015-05-16 HISTORY — PX: LAPAROTOMY: SHX154

## 2015-05-16 LAB — CBC
HCT: 27.3 % — ABNORMAL LOW (ref 39.0–52.0)
Hemoglobin: 8.8 g/dL — ABNORMAL LOW (ref 13.0–17.0)
MCH: 28.9 pg (ref 26.0–34.0)
MCHC: 32.2 g/dL (ref 30.0–36.0)
MCV: 89.8 fL (ref 78.0–100.0)
Platelets: 345 10*3/uL (ref 150–400)
RBC: 3.04 MIL/uL — ABNORMAL LOW (ref 4.22–5.81)
RDW: 16.4 % — ABNORMAL HIGH (ref 11.5–15.5)
WBC: 26.6 10*3/uL — ABNORMAL HIGH (ref 4.0–10.5)

## 2015-05-16 LAB — POCT I-STAT 3, ART BLOOD GAS (G3+)
Acid-Base Excess: 4 mmol/L — ABNORMAL HIGH (ref 0.0–2.0)
Bicarbonate: 27.3 mEq/L — ABNORMAL HIGH (ref 20.0–24.0)
O2 Saturation: 92 %
Patient temperature: 98
TCO2: 28 mmol/L (ref 0–100)
pCO2 arterial: 32.3 mmHg — ABNORMAL LOW (ref 35.0–45.0)
pH, Arterial: 7.533 — ABNORMAL HIGH (ref 7.350–7.450)
pO2, Arterial: 54 mmHg — ABNORMAL LOW (ref 80.0–100.0)

## 2015-05-16 LAB — COMPREHENSIVE METABOLIC PANEL
ALT: 59 U/L (ref 17–63)
AST: 101 U/L — ABNORMAL HIGH (ref 15–41)
Albumin: 1.7 g/dL — ABNORMAL LOW (ref 3.5–5.0)
Alkaline Phosphatase: 49 U/L (ref 38–126)
Anion gap: 9 (ref 5–15)
BUN: 19 mg/dL (ref 6–20)
CO2: 28 mmol/L (ref 22–32)
Calcium: 8 mg/dL — ABNORMAL LOW (ref 8.9–10.3)
Chloride: 110 mmol/L (ref 101–111)
Creatinine, Ser: 0.8 mg/dL (ref 0.61–1.24)
GFR calc Af Amer: >60 mL/min (ref >60)
GFR calc non Af Amer: >60 mL/min (ref >60)
Glucose, Bld: 172 mg/dL — ABNORMAL HIGH (ref 65–99)
Potassium: 3.4 mmol/L — ABNORMAL LOW (ref 3.5–5.1)
Sodium: 147 mmol/L — ABNORMAL HIGH (ref 135–145)
Total Bilirubin: 3.8 mg/dL — ABNORMAL HIGH (ref 0.3–1.2)
Total Protein: 5.2 g/dL — ABNORMAL LOW (ref 6.5–8.1)

## 2015-05-16 LAB — MAGNESIUM: Magnesium: 2.3 mg/dL (ref 1.7–2.4)

## 2015-05-16 LAB — GLUCOSE, CAPILLARY
Glucose-Capillary: 109 mg/dL — ABNORMAL HIGH (ref 65–99)
Glucose-Capillary: 127 mg/dL — ABNORMAL HIGH (ref 65–99)
Glucose-Capillary: 137 mg/dL — ABNORMAL HIGH (ref 65–99)
Glucose-Capillary: 140 mg/dL — ABNORMAL HIGH (ref 65–99)

## 2015-05-16 LAB — TRIGLYCERIDES
Triglycerides: 421 mg/dL — ABNORMAL HIGH (ref <150)
Triglycerides: 1074 mg/dL — ABNORMAL HIGH (ref <150)

## 2015-05-16 LAB — PHOSPHORUS: Phosphorus: 3.5 mg/dL (ref 2.5–4.6)

## 2015-05-16 SURGERY — LAPAROTOMY, EXPLORATORY
Anesthesia: General | Site: Abdomen

## 2015-05-16 MED ORDER — ROCURONIUM BROMIDE 100 MG/10ML IV SOLN
INTRAVENOUS | Status: DC | PRN
  Administered 2015-05-16: 50 mg via INTRAVENOUS

## 2015-05-16 MED ORDER — TRACE MINERALS CR-CU-MN-SE-ZN 10-1000-500-60 MCG/ML IV SOLN: INTRAVENOUS | Status: DC

## 2015-05-16 MED ORDER — ALBUTEROL SULFATE (2.5 MG/3ML) 0.083% IN NEBU: 2.5000 mg | INHALATION_SOLUTION | RESPIRATORY_TRACT | Status: DC | PRN

## 2015-05-16 MED ORDER — PHENYLEPHRINE HCL 10 MG/ML IJ SOLN
INTRAMUSCULAR | Status: DC | PRN
  Administered 2015-05-16: 200 ug via INTRAVENOUS

## 2015-05-16 MED ORDER — TRACE MINERALS CR-CU-MN-SE-ZN 10-1000-500-60 MCG/ML IV SOLN
INTRAVENOUS | Status: AC
  Administered 2015-05-16: 18:00:00 via INTRAVENOUS
  Filled 2015-05-16: qty 2000

## 2015-05-16 MED ORDER — PHENYLEPHRINE HCL 10 MG/ML IJ SOLN
INTRAMUSCULAR | Status: AC
  Filled 2015-05-16: qty 1

## 2015-05-16 MED ORDER — WARFARIN SODIUM 10 MG PO TABS
10.0000 mg | ORAL_TABLET | Freq: Once | ORAL | Status: AC
  Administered 2015-05-16: 10 mg via ORAL
  Filled 2015-05-16: qty 1

## 2015-05-16 MED ORDER — ROCURONIUM BROMIDE 50 MG/5ML IV SOLN
INTRAVENOUS | Status: AC
  Filled 2015-05-16: qty 1

## 2015-05-16 MED ORDER — FAT EMULSION 20 % IV EMUL: 250.0000 mL | INTRAVENOUS | Status: DC

## 2015-05-16 MED ORDER — LACTATED RINGERS IV SOLN
INTRAVENOUS | Status: DC | PRN
  Administered 2015-05-16 (×2): via INTRAVENOUS

## 2015-05-16 MED ORDER — MIDAZOLAM HCL 2 MG/2ML IJ SOLN
INTRAMUSCULAR | Status: DC | PRN
  Administered 2015-05-16: 2 mg via INTRAVENOUS

## 2015-05-16 MED ORDER — DEXTROSE-NACL 5-0.2 % IV SOLN
INTRAVENOUS | Status: AC
  Administered 2015-05-16: 18:00:00 via INTRAVENOUS
  Filled 2015-05-16 (×3): qty 1000

## 2015-05-16 MED ORDER — FENTANYL CITRATE (PF) 250 MCG/5ML IJ SOLN
INTRAMUSCULAR | Status: AC
  Filled 2015-05-16: qty 5

## 2015-05-16 MED ORDER — POTASSIUM CHLORIDE 10 MEQ/50ML IV SOLN
10.0000 meq | INTRAVENOUS | Status: AC
  Administered 2015-05-16 (×2): 10 meq via INTRAVENOUS
  Filled 2015-05-16 (×2): qty 50

## 2015-05-16 MED ORDER — PROPOFOL 10 MG/ML IV BOLUS
INTRAVENOUS | Status: DC | PRN
  Administered 2015-05-16: 200 mg via INTRAVENOUS

## 2015-05-16 MED ORDER — 0.9 % SODIUM CHLORIDE (POUR BTL) OPTIME
TOPICAL | Status: DC | PRN
  Administered 2015-05-16: 6000 mL

## 2015-05-16 MED ORDER — SUCCINYLCHOLINE CHLORIDE 20 MG/ML IJ SOLN
INTRAMUSCULAR | Status: DC | PRN
  Administered 2015-05-16: 140 mg via INTRAVENOUS

## 2015-05-16 MED ORDER — LIDOCAINE HCL (CARDIAC) 20 MG/ML IV SOLN
INTRAVENOUS | Status: DC | PRN
  Administered 2015-05-16: 80 mg via INTRAVENOUS

## 2015-05-16 MED ORDER — FENTANYL CITRATE (PF) 100 MCG/2ML IJ SOLN
25.0000 ug | INTRAMUSCULAR | Status: DC | PRN
  Administered 2015-05-16 (×3): 100 ug via INTRAVENOUS
  Filled 2015-05-16 (×3): qty 2

## 2015-05-16 MED ORDER — IPRATROPIUM-ALBUTEROL 0.5-2.5 (3) MG/3ML IN SOLN
3.0000 mL | Freq: Three times a day (TID) | RESPIRATORY_TRACT | Status: DC
  Administered 2015-05-16 – 2015-06-21 (×104): 3 mL via RESPIRATORY_TRACT
  Filled 2015-05-16 (×43): qty 3
  Filled 2015-05-16: qty 42
  Filled 2015-05-16 (×60): qty 3

## 2015-05-16 MED ORDER — FENTANYL CITRATE (PF) 100 MCG/2ML IJ SOLN: 100.0000 ug | INTRAMUSCULAR | Status: DC | PRN

## 2015-05-16 MED ORDER — ENOXAPARIN SODIUM 100 MG/ML ~~LOC~~ SOLN
1.0000 mg/kg | Freq: Two times a day (BID) | SUBCUTANEOUS | Status: DC
  Filled 2015-05-16 (×2): qty 1

## 2015-05-16 MED ORDER — PROPOFOL 1000 MG/100ML IV EMUL
0.0000 ug/kg/min | INTRAVENOUS | Status: DC
  Administered 2015-05-16: 50 ug/kg/min via INTRAVENOUS
  Administered 2015-05-16: 60 ug/kg/min via INTRAVENOUS
  Administered 2015-05-16: via INTRAVENOUS
  Filled 2015-05-16: qty 100

## 2015-05-16 MED ORDER — FAT EMULSION 20 % IV EMUL
250.0000 mL | INTRAVENOUS | Status: DC
  Administered 2015-05-16: 250 mL via INTRAVENOUS
  Filled 2015-05-16: qty 250

## 2015-05-16 MED ORDER — WARFARIN - PHARMACIST DOSING INPATIENT
Freq: Every day | Status: DC
  Administered 2015-05-16: 18:00:00

## 2015-05-16 MED ORDER — MIDAZOLAM HCL 2 MG/2ML IJ SOLN
INTRAMUSCULAR | Status: AC
  Filled 2015-05-16: qty 4

## 2015-05-16 MED ORDER — ROCURONIUM BROMIDE 50 MG/5ML IV SOLN
INTRAVENOUS | Status: AC
  Filled 2015-05-16: qty 2

## 2015-05-16 MED ORDER — FENTANYL CITRATE (PF) 100 MCG/2ML IJ SOLN
100.0000 ug | INTRAMUSCULAR | Status: DC | PRN
  Administered 2015-05-16 (×2): 100 ug via INTRAVENOUS
  Filled 2015-05-16 (×2): qty 2

## 2015-05-16 MED ORDER — SODIUM CHLORIDE 0.9 % IV SOLN
INTRAVENOUS | Status: DC | PRN
  Administered 2015-05-21 (×2): via INTRA_ARTERIAL

## 2015-05-16 SURGICAL SUPPLY — 47 items
APPLICATOR COTTON TIP 6IN STRL (MISCELLANEOUS) ×1 IMPLANT
BLADE SURG ROTATE 9660 (MISCELLANEOUS) IMPLANT
CANISTER SUCTION 2500CC (MISCELLANEOUS) ×2 IMPLANT
CHLORAPREP W/TINT 26ML (MISCELLANEOUS) ×1 IMPLANT
COVER MAYO STAND STRL (DRAPES) IMPLANT
COVER SURGICAL LIGHT HANDLE (MISCELLANEOUS) ×2 IMPLANT
DRAPE LAPAROSCOPIC ABDOMINAL (DRAPES) ×2 IMPLANT
DRAPE PROXIMA HALF (DRAPES) IMPLANT
DRAPE UTILITY XL STRL (DRAPES) ×4 IMPLANT
DRAPE WARM FLUID 44X44 (DRAPE) ×2 IMPLANT
DRSG OPSITE POSTOP 4X10 (GAUZE/BANDAGES/DRESSINGS) IMPLANT
DRSG OPSITE POSTOP 4X8 (GAUZE/BANDAGES/DRESSINGS) IMPLANT
ELECT BLADE 6.5 EXT (BLADE) IMPLANT
ELECT CAUTERY BLADE 6.4 (BLADE) ×4 IMPLANT
ELECT REM PT RETURN 9FT ADLT (ELECTROSURGICAL) ×2
ELECTRODE REM PT RTRN 9FT ADLT (ELECTROSURGICAL) ×1 IMPLANT
GLOVE BIO SURGEON STRL SZ7 (GLOVE) ×2 IMPLANT
GLOVE BIOGEL PI IND STRL 7.5 (GLOVE) ×1 IMPLANT
GLOVE BIOGEL PI INDICATOR 7.5 (GLOVE) ×1
GOWN STRL REUS W/ TWL LRG LVL3 (GOWN DISPOSABLE) ×3 IMPLANT
GOWN STRL REUS W/TWL LRG LVL3 (GOWN DISPOSABLE) ×6
KIT BASIN OR (CUSTOM PROCEDURE TRAY) ×2 IMPLANT
KIT ROOM TURNOVER OR (KITS) ×2 IMPLANT
LIGASURE IMPACT 36 18CM CVD LR (INSTRUMENTS) IMPLANT
NS IRRIG 1000ML POUR BTL (IV SOLUTION) ×4 IMPLANT
PACK GENERAL/GYN (CUSTOM PROCEDURE TRAY) ×2 IMPLANT
PAD ARMBOARD 7.5X6 YLW CONV (MISCELLANEOUS) ×2 IMPLANT
PENCIL BUTTON HOLSTER BLD 10FT (ELECTRODE) IMPLANT
SPECIMEN JAR LARGE (MISCELLANEOUS) IMPLANT
SPONGE ABDOMINAL VAC ABTHERA (MISCELLANEOUS) ×1 IMPLANT
SPONGE LAP 18X18 X RAY DECT (DISPOSABLE) ×2 IMPLANT
STAPLER PROXIMATE 75MM BLUE (STAPLE) IMPLANT
STAPLER VISISTAT 35W (STAPLE) ×2 IMPLANT
SUCTION POOLE TIP (SUCTIONS) ×2 IMPLANT
SUT PDS AB 1 TP1 96 (SUTURE) ×4 IMPLANT
SUT SILK 2 0 (SUTURE) ×2
SUT SILK 2 0 SH CR/8 (SUTURE) ×2 IMPLANT
SUT SILK 2-0 18XBRD TIE 12 (SUTURE) ×1 IMPLANT
SUT SILK 3 0 (SUTURE) ×2
SUT SILK 3 0 SH CR/8 (SUTURE) ×2 IMPLANT
SUT SILK 3-0 18XBRD TIE 12 (SUTURE) ×1 IMPLANT
SUT VIC AB 3-0 SH 27 (SUTURE)
SUT VIC AB 3-0 SH 27X BRD (SUTURE) IMPLANT
TOWEL OR 17X26 10 PK STRL BLUE (TOWEL DISPOSABLE) ×3 IMPLANT
TRAY FOLEY CATH 16FRSI W/METER (SET/KITS/TRAYS/PACK) IMPLANT
TUBE CONNECTING 12X1/4 (SUCTIONS) IMPLANT
YANKAUER SUCT BULB TIP NO VENT (SUCTIONS) IMPLANT

## 2015-05-16 NOTE — Progress Notes (Addendum)
Patient ID: Raymond Bowen, male   DOB: [birth year], 39 y.o.   MRN: [id number] After intubation i was able to evaluate his wound more.  This has dehisced and he has a lot of brown fluid that is coming from that.  I think he needs to return to or for washout and vac placement.  Will try to see if there is any other leakage that is ongoing I have discussed this with his mother [name] and she has consented for me to proceed. I am going to stop his warfarin that was started. He only received 40 mg lovenox earlier and is due to get his 100 mg later tonight.
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
## 2015-05-16 NOTE — Progress Notes (Signed)
I am following pt's progress and will meet with pt and his family to begin discussions for his rehab venue options. [phone]
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
## 2015-05-16 NOTE — Anesthesia Procedure Notes (Signed)
Procedures   Right IJ vein image
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
## 2015-05-16 NOTE — Anesthesia Preprocedure Evaluation (Signed)
Anesthesia Evaluation  Patient identified by MRN, date of birth, ID band  Reviewed: Allergy & Precautions, NPO status , Patient's Chart, lab work & pertinent test results, Unable to perform ROS - Chart review only  Airway Mallampati: II  TM Distance: >3 FB Neck ROM: Full    Dental  (+) Teeth Intact, Dental Advisory Given   Pulmonary  Respiratory failure   breath sounds clear to auscultation       Cardiovascular  Rhythm:Regular Rate:Normal     Neuro/Psych    GI/Hepatic   Endo/Other    Renal/GU      Musculoskeletal   Abdominal   Peds  Hematology   Anesthesia Other Findings Pt intubated by anesthesia earlier tonight. ? Sepsis, infected wound.  Reproductive/Obstetrics                             Anesthesia Physical Anesthesia Plan  ASA: [name] and emergent  Anesthesia Plan: General   Post-op Pain Management:    Induction: Intravenous  Airway Management Planned: Oral ETT  Additional Equipment:   Intra-op Plan:   Post-operative Plan: Post-operative intubation/ventilation  Informed Consent:   History available from chart only and Only emergency history available  Plan Discussed with: CRNA, Anesthesiologist and Surgeon  Anesthesia Plan Comments: (Unable to discuss consent with pt as I did not know he was coming to the OR prior to his being intubated.)        Anesthesia Quick Evaluation

## 2015-05-16 NOTE — Consult Note (Signed)
WOC wound follow up Wound type: midline surgical Measurement:25cm x 5cm x 2.5cm  Wound bed: 70 % yellow-brown/grey slough in the base above umbilicus and below umbilicus.  30% pink, subcutaneous tissue, some early granulation proximally See trauma note for picture Drainage (amount, consistency, odor) brown/serousanginous, did not appear to be purulent in the VAC canister. However at the time of there removal the wound has some foul odor.  Dr. Lindie SpruceWyatt at the bedside to assess as well.  Periwound: intact, ileostomy in the RLQ Dressing procedure/placement/frequency: DC NPWT dressings for now, begin BID NS moist to moist dressing.   WOC ostomy follow up Stoma type/location: RLQ, ileosotmy Stomal assessment/size: just a bit larger than 1 3/4" round, budded. Stoma has some mucosal necrosis, and staining.  The stoma is edematous and not significantly moist.   Peristomal assessment: intact, abdomen is tight Treatment options for stomal/peristomal skin: add 2" barrier ring to maintain seal with ileotomy  Output liquid brown Ostomy pouching: 2pc. 2 1/4" used with 2" barrier ring. Education provided: left educational booklet, however I do not think patient is ready for education at this time. Still has NG and mostly focused on ice chips.  He does shake his head about knowing about the ostomy and pouch. [place] Yes  Was planning to meet his mom this am, however she is not at the bedside. The patient has given me permission to contact her to see what her plans are as far as support with his care at DC.  WOC will follow along with you for continued support with ostomy teaching and care and wound care.  Evelyn Aguinaldo Bon [place] RN,CWOCN [phone]

## 2015-05-16 NOTE — Progress Notes (Signed)
Patient ID: Raymond Bowen, male   DOB: [birth year], 39 y.o.   MRN: [id number] He has been tachypneic all day this has been progressive tonight requiring facemask where he was on nasal cannula. He clearly is working to breathe.  The room is foul smelling and his wound has murky drainage.  His what I think is fascia is not viable and I think this is what is leading to smell clearly.  His po2 is 54.  I think he needs to be reintubated now.  I have asked anesthesia to come see him.  Will then place foley, sedate him.  Has cxr and axr pending right now also.  If nothing significant I will send down for ct scan at some point also.  I am concerned with wound and drainage he may need abdomen opened again.

## 2015-05-16 NOTE — Progress Notes (Signed)
VASCULAR LAB PRELIMINARY  PRELIMINARY  PRELIMINARY  PRELIMINARY  Bilateral lower extremity venous duplex  completed.    Preliminary report:  Right:  DVT noted in the CFV, FV, popliteal v, gastrocnemius v, PTV, and peroneal v .  No evidence of superficial thrombosis.  No Baker's cyst.  Left: DVT noted in the gastrocnemius v.  No evidence of superficial thrombosis.  No Baker's cyst.   Meredith Mells, RVT 05/16/2015, 2:04 PM

## 2015-05-17 ENCOUNTER — Inpatient Hospital Stay (HOSPITAL_COMMUNITY): Payer: No Typology Code available for payment source

## 2015-05-17 ENCOUNTER — Encounter (HOSPITAL_COMMUNITY): Payer: Self-pay | Admitting: General Surgery

## 2015-05-17 LAB — BASIC METABOLIC PANEL
Anion gap: 13 (ref 5–15)
BUN: 18 mg/dL (ref 6–20)
CO2: 25 mmol/L (ref 22–32)
Calcium: 7.6 mg/dL — ABNORMAL LOW (ref 8.9–10.3)
Chloride: 109 mmol/L (ref 101–111)
Creatinine, Ser: 0.92 mg/dL (ref 0.61–1.24)
GFR calc Af Amer: >60 mL/min (ref >60)
GFR calc non Af Amer: >60 mL/min (ref >60)
Glucose, Bld: 170 mg/dL — ABNORMAL HIGH (ref 65–99)
Potassium: 3.3 mmol/L — ABNORMAL LOW (ref 3.5–5.1)
Sodium: 147 mmol/L — ABNORMAL HIGH (ref 135–145)

## 2015-05-17 LAB — CBC WITH DIFFERENTIAL/PLATELET
Basophils Absolute: 0.1 10*3/uL (ref 0.0–0.1)
Basophils Relative: 0 %
Eosinophils Absolute: 0 10*3/uL (ref 0.0–0.7)
Eosinophils Relative: 0 %
HCT: 29.3 % — ABNORMAL LOW (ref 39.0–52.0)
Hemoglobin: 9.4 g/dL — ABNORMAL LOW (ref 13.0–17.0)
Lymphocytes Relative: 8 %
Lymphs Abs: 1.6 10*3/uL (ref 0.7–4.0)
MCH: 29.2 pg (ref 26.0–34.0)
MCHC: 32.1 g/dL (ref 30.0–36.0)
MCV: 91 fL (ref 78.0–100.0)
Monocytes Absolute: 1.2 10*3/uL — ABNORMAL HIGH (ref 0.1–1.0)
Monocytes Relative: 6 %
Neutro Abs: 18 10*3/uL — ABNORMAL HIGH (ref 1.7–7.7)
Neutrophils Relative %: 86 %
Platelets: 339 10*3/uL (ref 150–400)
RBC: 3.22 MIL/uL — ABNORMAL LOW (ref 4.22–5.81)
RDW: 16.5 % — ABNORMAL HIGH (ref 11.5–15.5)
WBC Morphology: INCREASED
WBC: 20.9 10*3/uL — ABNORMAL HIGH (ref 4.0–10.5)

## 2015-05-17 LAB — HEPARIN LEVEL (UNFRACTIONATED)
Heparin Unfractionated: <0.1 IU/mL — ABNORMAL LOW (ref 0.30–0.70)
Heparin Unfractionated: <0.1 IU/mL — ABNORMAL LOW (ref 0.30–0.70)

## 2015-05-17 LAB — GLUCOSE, CAPILLARY
Glucose-Capillary: 139 mg/dL — ABNORMAL HIGH (ref 65–99)
Glucose-Capillary: 146 mg/dL — ABNORMAL HIGH (ref 65–99)
Glucose-Capillary: 149 mg/dL — ABNORMAL HIGH (ref 65–99)
Glucose-Capillary: 163 mg/dL — ABNORMAL HIGH (ref 65–99)

## 2015-05-17 LAB — PROTIME-INR
INR: 1.2 (ref 0.00–1.49)
Prothrombin Time: 15.4 seconds — ABNORMAL HIGH (ref 11.6–15.2)

## 2015-05-17 MED ORDER — FENTANYL CITRATE (PF) 100 MCG/2ML IJ SOLN: 50.0000 ug | Freq: Once | INTRAMUSCULAR | Status: DC

## 2015-05-17 MED ORDER — POTASSIUM CHLORIDE 10 MEQ/50ML IV SOLN
10.0000 meq | INTRAVENOUS | Status: AC
  Administered 2015-05-17 (×4): 10 meq via INTRAVENOUS
  Filled 2015-05-17 (×4): qty 50

## 2015-05-17 MED ORDER — MIDAZOLAM HCL 2 MG/2ML IJ SOLN
2.0000 mg | INTRAMUSCULAR | Status: DC | PRN
  Filled 2015-05-17 (×6): qty 2

## 2015-05-17 MED ORDER — HEPARIN (PORCINE) IN NACL 100-0.45 UNIT/ML-% IJ SOLN
1400.0000 [IU]/h | INTRAMUSCULAR | Status: DC
  Administered 2015-05-17: 1400 [IU]/h via INTRAVENOUS
  Filled 2015-05-17 (×2): qty 250

## 2015-05-17 MED ORDER — NOREPINEPHRINE BITARTRATE 1 MG/ML IV SOLN
2.0000 ug/min | INTRAVENOUS | Status: DC
  Administered 2015-05-17: 7 ug/min via INTRAVENOUS
  Administered 2015-05-17: 5 ug/min via INTRAVENOUS
  Administered 2015-05-18: 6 ug/min via INTRAVENOUS
  Administered 2015-05-19: 2 ug/min via INTRAVENOUS
  Filled 2015-05-17 (×5): qty 4

## 2015-05-17 MED ORDER — SODIUM CHLORIDE 0.9 % IV BOLUS (SEPSIS)
1000.0000 mL | Freq: Once | INTRAVENOUS | Status: AC
  Administered 2015-05-17: 1000 mL via INTRAVENOUS

## 2015-05-17 MED ORDER — ANTISEPTIC ORAL RINSE SOLUTION (CORINZ)
7.0000 mL | OROMUCOSAL | Status: DC
  Administered 2015-05-17 – 2015-06-05 (×193): 7 mL via OROMUCOSAL

## 2015-05-17 MED ORDER — TRACE MINERALS CR-CU-MN-SE-ZN 10-1000-500-60 MCG/ML IV SOLN
INTRAVENOUS | Status: AC
  Administered 2015-05-17: 18:00:00 via INTRAVENOUS
  Filled 2015-05-17: qty 3000

## 2015-05-17 MED ORDER — SODIUM CHLORIDE 0.9 % IV SOLN
1.0000 g | Freq: Three times a day (TID) | INTRAVENOUS | Status: DC
  Administered 2015-05-17 – 2015-05-20 (×9): 1 g via INTRAVENOUS
  Filled 2015-05-17 (×11): qty 1

## 2015-05-17 MED ORDER — CHLORHEXIDINE GLUCONATE 0.12% ORAL RINSE (MEDLINE KIT)
15.0000 mL | Freq: Two times a day (BID) | OROMUCOSAL | Status: DC
  Administered 2015-05-17 – 2015-06-10 (×48): 15 mL via OROMUCOSAL

## 2015-05-17 MED ORDER — SODIUM CHLORIDE 0.9 % IV BOLUS (SEPSIS)
500.0000 mL | Freq: Once | INTRAVENOUS | Status: AC
  Administered 2015-05-17: 500 mL via INTRAVENOUS

## 2015-05-17 MED ORDER — FENTANYL CITRATE (PF) 2500 MCG/50ML IJ SOLN
25.0000 ug/h | INTRAMUSCULAR | Status: DC
  Administered 2015-05-17 – 2015-05-19 (×9): 400 ug/h via INTRAVENOUS
  Filled 2015-05-17 (×11): qty 50

## 2015-05-17 MED ORDER — MIDAZOLAM HCL 2 MG/2ML IJ SOLN
2.0000 mg | INTRAMUSCULAR | Status: DC | PRN
  Administered 2015-05-17: 2 mg via INTRAVENOUS
  Filled 2015-05-17 (×2): qty 2

## 2015-05-17 MED ORDER — ALBUMIN HUMAN 5 % IV SOLN
25.0000 g | Freq: Once | INTRAVENOUS | Status: AC
  Administered 2015-05-17: 25 g via INTRAVENOUS
  Filled 2015-05-17: qty 500

## 2015-05-17 MED ORDER — FENTANYL BOLUS VIA INFUSION
50.0000 ug | INTRAVENOUS | Status: DC | PRN
  Administered 2015-05-18 – 2015-05-26 (×4): 50 ug via INTRAVENOUS
  Administered 2015-05-31: 200 ug via INTRAVENOUS
  Administered 2015-05-31 (×2): 50 ug via INTRAVENOUS
  Administered 2015-05-31: 200 ug via INTRAVENOUS
  Administered 2015-05-31: 50 ug via INTRAVENOUS
  Administered 2015-06-02 (×2): 200 ug via INTRAVENOUS
  Administered 2015-06-07: 100 ug via INTRAVENOUS
  Administered 2015-06-08 – 2015-06-13 (×3): 50 ug via INTRAVENOUS
  Filled 2015-05-17: qty 50

## 2015-05-17 MED ORDER — HEPARIN (PORCINE) IN NACL 100-0.45 UNIT/ML-% IJ SOLN
2400.0000 [IU]/h | INTRAMUSCULAR | Status: DC
  Administered 2015-05-17: 1750 [IU]/h via INTRAVENOUS
  Filled 2015-05-17 (×4): qty 250

## 2015-05-17 MED ORDER — ACETAMINOPHEN 650 MG RE SUPP
650.0000 mg | Freq: Four times a day (QID) | RECTAL | Status: DC | PRN
  Administered 2015-05-17 – 2015-05-29 (×20): 650 mg via RECTAL
  Filled 2015-05-17 (×22): qty 1

## 2015-05-17 MED ORDER — SODIUM CHLORIDE 0.9 % IV SOLN
1.0000 g | INTRAVENOUS | Status: DC
  Filled 2015-05-17: qty 1

## 2015-05-17 MED ORDER — PROPOFOL 1000 MG/100ML IV EMUL
0.0000 ug/kg/min | INTRAVENOUS | Status: DC
  Administered 2015-05-17 (×3): 60 ug/kg/min via INTRAVENOUS
  Filled 2015-05-17 (×3): qty 100

## 2015-05-17 MED ORDER — MIDAZOLAM HCL 2 MG/2ML IJ SOLN
2.0000 mg | INTRAMUSCULAR | Status: DC | PRN
  Administered 2015-05-18 (×8): 2 mg via INTRAVENOUS
  Filled 2015-05-17 (×2): qty 2

## 2015-05-17 MED ORDER — DEXMEDETOMIDINE HCL IN NACL 400 MCG/100ML IV SOLN
0.0000 ug/kg/h | INTRAVENOUS | Status: AC
  Administered 2015-05-17: 0.4 ug/kg/h via INTRAVENOUS
  Administered 2015-05-17 (×2): 0.7 ug/kg/h via INTRAVENOUS
  Administered 2015-05-17: 0.9 ug/kg/h via INTRAVENOUS
  Administered 2015-05-18 (×4): 1.2 ug/kg/h via INTRAVENOUS
  Administered 2015-05-18: 1 ug/kg/h via INTRAVENOUS
  Administered 2015-05-18: 1.2 ug/kg/h via INTRAVENOUS
  Administered 2015-05-18: 1 ug/kg/h via INTRAVENOUS
  Administered 2015-05-18: 1.2 ug/kg/h via INTRAVENOUS
  Administered 2015-05-18: 1.199 ug/kg/h via INTRAVENOUS
  Administered 2015-05-19: 0.2 ug/kg/h via INTRAVENOUS
  Administered 2015-05-19 (×5): 1.2 ug/kg/h via INTRAVENOUS
  Administered 2015-05-19: 0.8 ug/kg/h via INTRAVENOUS
  Administered 2015-05-20: 1 ug/kg/h via INTRAVENOUS
  Administered 2015-05-20: 0.6 ug/kg/h via INTRAVENOUS
  Filled 2015-05-17: qty 50
  Filled 2015-05-17: qty 100
  Filled 2015-05-17: qty 50
  Filled 2015-05-17: qty 100
  Filled 2015-05-17: qty 50
  Filled 2015-05-17 (×6): qty 100
  Filled 2015-05-17: qty 50
  Filled 2015-05-17: qty 100
  Filled 2015-05-17: qty 50
  Filled 2015-05-17 (×8): qty 100
  Filled 2015-05-17: qty 50
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
## 2015-05-17 NOTE — Progress Notes (Signed)
I met with pt's Mom briefly at bedside to introduce myself. I will follow pt's progress at a distance at this time. Noted surgery last night. [phone]

## 2015-05-17 NOTE — Anesthesia Postprocedure Evaluation (Signed)
  Anesthesia Post-op Note  Patient: Raymond Bowen  Procedure(s) Performed: Procedure(s): REEXPLORATION LAPAROTOMY WITH WOUND VAC PLACEMENT, RESECTION OF ILEOSTOMY, DEBRIDEMENT OF ABDOMINAL WALL (N/A)  Patient Location: ICU  Anesthesia Type:General  Level of Consciousness: sedated and Patient remains intubated per anesthesia plan  Airway and Oxygen Therapy: Patient remains intubated per anesthesia plan and Patient placed on Ventilator (see vital sign flow sheet for setting)  Post-op Pain: none  Post-op Assessment: Post-op Vital signs reviewed, Patient's Cardiovascular Status Stable and Respiratory Function Stable LLE Motor Response: Non-purposeful movement   RLE Motor Response: Non-purposeful movement        Post-op Vital Signs: Reviewed and stable  Last Vitals:  Filed Vitals:   05/17/15 0100  BP: 105/52  Pulse:   Temp:   Resp: 21    Complications: No apparent anesthesia complications

## 2015-05-17 NOTE — Progress Notes (Signed)
Per report, dayshift having problems with "partial occulusion". Wound Vac continues to alarm partial occulusion and then alarms "Blockage Alarm". No blockage visualized in cannister tubing. Attempt to manipulate wound vac setup. Dressing remains intact and appears to be WNL. "Blockage Alarm" alarms again within 15 minutes.  MD called, notified. Instructed to keep same canister but switch out wound vac machines. Will continue to monitor closely.

## 2015-05-17 NOTE — Progress Notes (Signed)
ANTICOAGULATION CONSULT NOTE - FOLLOW UP    HL = <0.1 (goal 0.3 - 0.7 units/mL) Heparin dosing weight =  94 kg   Assessment: 39 YOM with bilateral DVT to continue on IV heparin.  Heparin level therapeutic.  Patient has a wound VAC but no frank bleeding per RN.  No issue with heparin infusion either.   Plan: - Increase heparin gtt to 1750 units/hr - Check 6 hr HL    Raymond Bowen D. Laney Potashang, PharmD, BCPS 05/17/2015, 4:04 PM

## 2015-05-17 NOTE — Progress Notes (Signed)
ANTICOAGULATION CONSULT NOTE - Initial Consult  Pharmacy Consult for Heparin  Indication: DVT  Allergies  Allergen Reactions  . Shellfish Allergy Anaphylaxis    Patient Measurements: Height: 5\' 10"  (177.8 cm) Weight: 225 lb 12 oz (102.4 kg) IBW/kg (Calculated) : 73  Vital Signs: Temp: 98 F (36.7 C) (10/25 2000) Temp Source: Oral (10/25 2000) BP: 111/57 mmHg (10/25 2212) Pulse Rate: 122 (10/25 2212)  Labs:  Recent Labs  05/14/15 0356 05/15/15 1210 05/16/15 0515  HGB 10.2* 9.5* 8.8*  HCT 30.4* 28.4* 27.3*  PLT 253 310 345  CREATININE 0.93 0.91 0.80    Estimated Creatinine Clearance: 151.6 mL/min (by C-G formula based on Cr of 0.8).   Medical History: History reviewed. No pertinent past medical history.  Assessment: 39 y/o M who was going to start Lovenox/Warfarin for bilateral DVT (received 1 dose of warfarin and zero doses of full dose Lovenox). Pt had to be taken back to the OR tonight for necrotizing fascitis of the abdomen/dead ileostomy. Pt is now to start heparin per pharmacy at 0600 this AM per MD.   Goal of Therapy:  Heparin level 0.3-0.7 units/ml Monitor platelets by anticoagulation protocol: Yes   Plan:  -Start heparin drip at 1400 units/hr at 0600 -1300 HL -Daily CBC/HL -Monitor for bleeding post-operatively   Abran DukeLedford, Lyzette Reinhardt 05/17/2015,12:22 AM

## 2015-05-17 NOTE — Transfer of Care (Signed)
Immediate Anesthesia Transfer of Care Note  Patient: Elmon ElseBrian XXXCurrie  Procedure(s) Performed: Procedure(s): REEXPLORATION LAPAROTOMY WITH WOUND VAC PLACEMENT, RESECTION OF ILEOSTOMY, DEBRIDEMENT OF ABDOMINAL WALL (N/A)  Patient Location: NICU  Anesthesia Type:General  Level of Consciousness: Patient remains intubated per anesthesia plan  Airway & Oxygen Therapy: Patient remains intubated per anesthesia plan and Patient placed on Ventilator (see vital sign flow sheet for setting)  Post-op Assessment: Report given to RN and Post -op Vital signs reviewed and stable  Post vital signs: Reviewed and stable  Last Vitals:  Filed Vitals:   05/16/15 2212  BP: 111/57  Pulse: 122  Temp:   Resp: 22    Complications: No apparent anesthesia complications

## 2015-05-17 NOTE — Clinical Documentation Improvement (Signed)
General Surgery  Please further clarify procedure documentation in the medical record of "debridement."   Type - excisional, non-excisional  Depth of - skin, subcutaneous tissue/fascia, muscle, joint, bone, etc,  Specify the type of instrument used, ex. scalpel, scissors, currette, etc.  Other condition  Clinically Undetermined  Supporting Information:  05/17/15 Op-note appears to have (2) separate debridements:  1) "Debridement of fascia, fat, and muscle at ileostomy site measuring 14 x 12 cm" (Debridement type and instrument used).  2) " I then noted that it appeared he had a necrotizing soft tissue infection of his abdominal wall. I debrided this to healthy-appearing tissue. I then placed a VAC overlying both of these." (Debridement type, instrument used, and depth).   Please exercise your independent, professional judgment when responding. A specific answer is not anticipated or expected.   Thank You, Cherylann Ratelonna J Zavion Sleight, RN, BSN [employer] Information Management [place] [phone]

## 2015-05-17 NOTE — Progress Notes (Signed)
Wound VAC changed per instructed. Note continual blockage alarmed. Second RN in room to help troubleshoot wound VAC. MD notified of wound VAC change and continued blockage alarm. Described wound VAC and sponge appearances and wound vac not "sucking". Instructed that not much drainage needs to come out but to make sure sponge remains compressed throughout night.  Also informed MD of pt brady down to 55bpm x2, not sustained very long. Addressed pt's alertness, pressure, drips and drip rates, as well PRN med given. Instructed to attempt to max out precedex if possible. Will continue to monitor closely.

## 2015-05-17 NOTE — Progress Notes (Signed)
Pt left for OR around 2230 and just returned. Pt placed back on previous settings
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
## 2015-05-17 NOTE — Progress Notes (Signed)
Follow up - Trauma and Critical Care  Patient Details:    Raymond Bowen is an 39 y.o. male.  Lines/tubes : Airway 7.5 mm (Active)  Secured at (cm) 25 cm 05/17/2015  8:43 AM  Measured From Lips 05/17/2015  8:43 AM  Secured Location Center 05/17/2015  8:43 AM  Secured By [employer] T[employer] Holder 05/17/2015  8:43 AM  Tube Holder Repositioned Yes 05/17/2015  8:43 AM  Site Condition Dry 05/17/2015  3:15 AM     PICC / Midline Double Lumen 05/15/15 PICC Right Brachial 45 cm 3 cm (Active)  Indication for Insertion or Continuance of Line Administration of hyperosmolar/irritating solutions (i.e. TPN, Vancomycin, etc.) 05/16/2015  8:00 PM  Exposed Catheter (cm) 3 cm 05/15/2015 12:00 PM  Site Assessment Clean;Dry;Intact 05/16/2015  8:00 PM  Lumen #1 Status Infusing 05/16/2015  8:00 PM  Lumen #2 Status Infusing 05/16/2015  8:00 PM  Dressing Type Transparent 05/16/2015  8:00 PM  Dressing Status Clean;Dry;Intact;Antimicrobial disc in place 05/16/2015  8:00 PM  Line Care Connections checked and tightened;Line pulled back 05/16/2015  8:00 PM  Dressing Intervention New dressing 05/15/2015 10:00 PM  Dressing Change Due 05/22/15 05/16/2015  8:00 AM     CVC Double Lumen 05/16/15 Right Internal jugular 20 cm 0 cm (Active)     Arterial Line 05/16/15 Right Radial (Active)     Negative Pressure Wound Therapy Abdomen (Active)  Site / Wound Assessment Dressing in place / Unable to assess 05/17/2015  1:00 AM  Peri-wound Assessment Pink 05/17/2015  1:00 AM  Cycle Continuous 05/17/2015  1:00 AM  Target Pressure (mmHg) 125 05/17/2015  1:00 AM  Dressing Status Intact 05/17/2015  1:00 AM  Drainage Amount Moderate 05/17/2015  1:00 AM  Drainage Description Sanguineous 05/17/2015  1:00 AM  Output (mL) 250 mL 05/17/2015  6:00 AM     NG/OG Tube Nasogastric 16 Fr. Right nare (Active)  Placement Verification Auscultation 05/16/2015  8:00 PM  Site Assessment Clean;Dry;Intact 05/16/2015  8:00 PM  Status Clamped  05/16/2015  8:00 PM  Drainage Appearance Bile;Brown;Green 05/16/2015  8:00 AM  Intake (mL) 0 mL 05/15/2015  2:00 PM  Output (mL) 150 mL 05/16/2015  9:28 AM     Urethral Catheter [name], RN Latex 16 Fr. (Active)  Indication for Insertion or Continuance of Catheter Peri-operative use for selective surgical procedure 05/17/2015  8:00 AM  Site Assessment Clean;Intact;Dry 05/17/2015  1:00 AM  Catheter Maintenance Bag below level of bladder;Catheter secured;Drainage bag/tubing not touching floor;Insertion date on drainage bag;No dependent loops;Seal intact;Bag emptied prior to transport 05/17/2015  8:00 AM  Collection Container Standard drainage bag 05/17/2015  1:00 AM  Securement Method Securing device (Describe) 05/17/2015  1:00 AM  Urinary Catheter Interventions Unclamped 05/17/2015  1:00 AM  Output (mL) 135 mL 05/17/2015  8:00 AM    Microbiology/Sepsis markers: Results for orders placed or performed during the hospital encounter of 05/07/15  MRSA PCR Screening     Status: None   Collection Time: 05/07/15 11:43 PM  Result Value Ref Range Status   MRSA by PCR NEGATIVE NEGATIVE Final    Comment:        The GeneXpert MRSA Assay (FDA approved for NASAL specimens only), is one component of a comprehensive MRSA colonization surveillance program. It is not intended to diagnose MRSA infection nor to guide or monitor treatment for MRSA infections.   Culture, Urine     Status: None   Collection Time: 05/14/15 11:22 AM  Result Value Ref Range Status   Specimen Description  URINE, CATHETERIZED  Final   Special Requests NONE  Final   Culture NO GROWTH 1 DAY  Final   Report Status 05/15/2015 FINAL  Final  Culture, expectorated sputum-assessment     Status: None   Collection Time: 05/15/15  8:50 AM  Result Value Ref Range Status   Specimen Description SPUTUM  Final   Special Requests Normal  Final   Sputum evaluation   Final    THIS SPECIMEN IS ACCEPTABLE. RESPIRATORY CULTURE REPORT TO  FOLLOW.   Report Status 05/15/2015 FINAL  Final  Culture, respiratory (NON-Expectorated)     Status: None (Preliminary result)   Collection Time: 05/15/15  8:50 AM  Result Value Ref Range Status   Specimen Description SPUTUM  Final   Special Requests NONE  Final   Gram Stain   Final    ABUNDANT WBC PRESENT, PREDOMINANTLY PMN RARE SQUAMOUS EPITHELIAL CELLS PRESENT MODERATE GRAM NEGATIVE RODS FEW GRAM POSITIVE COCCI IN PAIRS    Culture   Final    Culture reincubated for better growth Performed at [facility]    Report Status PENDING  Incomplete    Anti-infectives:  Anti-infectives    Start     Dose/Rate Route Frequency Ordered Stop   05/15/15 0815  piperacillin-tazobactam (ZOSYN) IVPB 3.375 g     3.375 g 12.5 mL/hr over 240 Minutes Intravenous 3 times per day 05/15/15 0804     05/08/15 0200  ceFAZolin (ANCEF) IVPB 2 g/50 mL premix     2 g 100 mL/hr over 30 Minutes Intravenous 3 times per day 05/08/15 0140 05/09/15 2238   05/08/15 0130  ceFAZolin (ANCEF) powder 2 g  Status:  Discontinued     2 g Other Every 8 hours 05/08/15 0129 05/08/15 0139      Best Practice/Protocols:  VTE Prophylaxis: Heparin (drip) and (for treatment of known LLE DVT) GI Prophylaxis: Proton Pump Inhibitor Continous Sedation Also on Levophed drip  Consults: Treatment Team:  Nada Libman, MD    Events:  Subjective:    Overnight Issues: Had to go back to surgery last now for peritonitis, abdominal cavity sepsis, necrotic ileostomy and small bowel, and dehiscence.  Has open abdomen with NPWD in place currently.  Objective:  Vital signs for last 24 hours: Temp:  [98 F (36.7 C)-101.7 F (38.7 C)] 101.7 F (38.7 C) (10/26 0345) Pulse Rate:  [50-142] 92 (10/26 0841) Resp:  [16-46] 16 (10/26 0841) BP: (95-164)/(52-92) 110/58 mmHg (10/26 0841) SpO2:  [91 %-100 %] 100 % (10/26 0841) Arterial Line BP: (105-139)/(41-61) 110/57 mmHg (10/26 0700) FiO2 (%):  [40 %-100 %] 40 % (10/26  0843)  Hemodynamic parameters for last 24 hours:    Intake/Output from previous day: 10/25 0701 - 10/26 0700 In: 5181.7 [P.O.:30; I.V.:3263.7; IV Piggyback:300; TPN:1588] Out: 2350 [Urine:900; Emesis/NG output:150; Drains:750; Stool:500; Blood:50]  Intake/Output this shift: Total I/O In: 50 [I.V.:40; TPN:10] Out: 135 [Urine:135]  Vent settings for last 24 hours: Vent Mode:  [-] PRVC FiO2 (%):  [40 %-100 %] 40 % Set Rate:  [16 bmp] 16 bmp Vt Set:  [580 mL] 580 mL PEEP:  [5 cmH20] 5 cmH20 Plateau Pressure:  [22 cmH20-28 cmH20] 25 cmH20  Physical Exam:  General: no respiratory distress and occasionally dysynchronous with the ventilator. Neuro: nonfocal exam and RASS -1 Resp: rhonchi bilaterally CVS: Moderate sinus tachycardia GI: soft, nontender, BS WNL, no r/g Extremities: edema 4+ and unequal size  Results for orders placed or performed during the hospital encounter of 05/07/15 (from  the past 24 hour(s))  Glucose, capillary     Status: Abnormal   Collection Time: 05/16/15 11:59 AM  Result Value Ref Range   Glucose-Capillary 127 (H) 65 - 99 mg/dL  Glucose, capillary     Status: Abnormal   Collection Time: 05/16/15  6:17 PM  Result Value Ref Range   Glucose-Capillary 109 (H) 65 - 99 mg/dL  I-STAT 3, arterial blood gas (G3+)     Status: Abnormal   Collection Time: 05/16/15  8:55 PM  Result Value Ref Range   pH, Arterial 7.533 (H) 7.350 - 7.450   pCO2 arterial 32.3 (L) 35.0 - 45.0 mmHg   pO2, Arterial 54.0 (L) 80.0 - 100.0 mmHg   Bicarbonate 27.3 (H) 20.0 - 24.0 mEq/L   TCO2 28 0 - 100 mmol/L   O2 Saturation 92.0 %   Acid-Base Excess 4.0 (H) 0.0 - 2.0 mmol/L   Patient temperature 98.0 F    Collection site RADIAL, ALLEN'S TEST ACCEPTABLE    Drawn by RT    Sample type ARTERIAL   Triglycerides     Status: Abnormal   Collection Time: 05/16/15 10:08 PM  Result Value Ref Range   Triglycerides 1074 (H) <150 mg/dL  Glucose, capillary     Status: Abnormal   Collection  Time: 05/17/15 12:50 AM  Result Value Ref Range   Glucose-Capillary 146 (H) 65 - 99 mg/dL  CBC with Differential/Platelet     Status: Abnormal (Preliminary result)   Collection Time: 05/17/15  4:45 AM  Result Value Ref Range   WBC PENDING 4.0 - 10.5 K/uL   RBC 3.22 (L) 4.22 - 5.81 MIL/uL   Hemoglobin 9.4 (L) 13.0 - 17.0 g/dL   HCT 16.1 (L) 09.6 - 04.5 %   MCV 91.0 78.0 - 100.0 fL   MCH 29.2 26.0 - 34.0 pg   MCHC 32.1 30.0 - 36.0 g/dL   RDW 40.9 (H) 81.1 - 91.4 %   Platelets PENDING 150 - 400 K/uL   Neutro Abs PENDING 1.7 - 7.7 K/uL   Lymphs Abs PENDING 0.7 - 4.0 K/uL   Monocytes Absolute PENDING 0.1 - 1.0 K/uL   Eosinophils Absolute PENDING 0.0 - 0.7 K/uL   Basophils Absolute PENDING 0.0 - 0.1 K/uL   Neutrophils Relative % 86 %   Lymphocytes Relative 8 %   Monocytes Relative 6 %   Eosinophils Relative 0 %   Basophils Relative 0 %   RBC Morphology TARGET CELLS    WBC Morphology INCREASED BANDS (>20% BANDS)    Smear Review LARGE PLATELETS PRESENT   Basic metabolic panel     Status: Abnormal   Collection Time: 05/17/15  4:45 AM  Result Value Ref Range   Sodium 147 (H) 135 - 145 mmol/L   Potassium 3.3 (L) 3.5 - 5.1 mmol/L   Chloride 109 101 - 111 mmol/L   CO2 25 22 - 32 mmol/L   Glucose, Bld 170 (H) 65 - 99 mg/dL   BUN 18 6 - 20 mg/dL   Creatinine, Ser 7.82 0.61 - 1.24 mg/dL   Calcium 7.6 (L) 8.9 - 10.3 mg/dL   GFR calc non Af Amer >60 >60 mL/min   GFR calc Af Amer >60 >60 mL/min   Anion gap 13 5 - 15  Protime-INR     Status: Abnormal   Collection Time: 05/17/15  4:45 AM  Result Value Ref Range   Prothrombin Time 15.4 (H) 11.6 - 15.2 seconds   INR 1.20 0.00 - 1.49  Glucose,  capillary     Status: Abnormal   Collection Time: 05/17/15  5:45 AM  Result Value Ref Range   Glucose-Capillary 149 (H) 65 - 99 mg/dL     Assessment/Plan:   NEURO  Altered Mental Status:  sedation   Plan: Will keep sedated without wake up assessments  PULM  Atelectasis/collapse (Bilateral  atelectasis with patchy infiltrates bilaterally.)   Plan: Continue support.  Check cultures  CARDIO  Sinus Tachycardia   Plan: No specific treatment  RENAL  Urine output is good and renal function is good.   Plan: CPM  GI  Open abdomen with bowel in discontinuity.  Probably back to surgery tomorrow.   Plan: CPM for now  ID  Peritonitis and possible pneumonia   Plan: On antibiotics.  May need to improve or change.  HEME  Anemia acute blood loss anemia)   Plan: No need for transfusion at this time.  ENDO No known problems   Plan: CPM  Global Issues  Open abdomen with sepsis.  On Levophed swhich we are trying to wean down.  Need to measure CVP.  Check respiratory culture.  Change abdominal coverage to Invanz.  Triglycerides > 100 while on lipids with TPN.  Will DC propofol and start Precedex.   LOS: 10 days   Additional comments:I reviewed the patient's new clinical lab test results. cbc/bmet and I reviewed the patients new imaging test results. cxr  Critical Care Total Time*: 45 Minutes, including conversation with the mother in Ag[place].  Ameet Sandy 05/17/2015  *Care during the described time interval was provided by me and/or other providers on the critical care team.  I have reviewed this patient's available data, including medical history, events of note, physical examination and test results as part of my evaluation.
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
## 2015-05-18 ENCOUNTER — Encounter (HOSPITAL_COMMUNITY): Admission: EM | Disposition: A | Discharge status: Rehabilitation Facility | Payer: Self-pay | Source: Home / Self Care

## 2015-05-18 ENCOUNTER — Inpatient Hospital Stay (HOSPITAL_COMMUNITY): Payer: No Typology Code available for payment source | Admitting: Anesthesiology

## 2015-05-18 HISTORY — PX: LAPAROTOMY: SHX154

## 2015-05-18 HISTORY — PX: WOUND DEBRIDEMENT: SHX247

## 2015-05-18 HISTORY — PX: VACUUM ASSISTED CLOSURE CHANGE: SHX5227

## 2015-05-18 LAB — HEPARIN LEVEL (UNFRACTIONATED): Heparin Unfractionated: 0.17 IU/mL — ABNORMAL LOW (ref 0.30–0.70)

## 2015-05-18 LAB — CULTURE, RESPIRATORY

## 2015-05-18 LAB — BLOOD GAS, ARTERIAL
Acid-base deficit: 0.1 mmol/L (ref 0.0–2.0)
Bicarbonate: 23.8 mEq/L (ref 20.0–24.0)
Drawn by: 280981
FIO2: 0.4
MECHVT: 0.58 mL
O2 Saturation: 96.9 %
PEEP: 5 cmH2O
Patient temperature: 98.6
RATE: 16 resp/min
TCO2: 24.9 mmol/L (ref 0–100)
pCO2 arterial: 36.8 mmHg (ref 35.0–45.0)
pH, Arterial: 7.426 (ref 7.350–7.450)
pO2, Arterial: 92.5 mmHg (ref 80.0–100.0)

## 2015-05-18 LAB — COMPREHENSIVE METABOLIC PANEL
ALT: 29 U/L (ref 17–63)
AST: 44 U/L — ABNORMAL HIGH (ref 15–41)
Albumin: 1.3 g/dL — ABNORMAL LOW (ref 3.5–5.0)
Alkaline Phosphatase: 41 U/L (ref 38–126)
Anion gap: 3 — ABNORMAL LOW (ref 5–15)
BUN: 16 mg/dL (ref 6–20)
CO2: 27 mmol/L (ref 22–32)
Calcium: 7.1 mg/dL — ABNORMAL LOW (ref 8.9–10.3)
Chloride: 111 mmol/L (ref 101–111)
Creatinine, Ser: 0.75 mg/dL (ref 0.61–1.24)
GFR calc Af Amer: >60 mL/min (ref >60)
GFR calc non Af Amer: >60 mL/min (ref >60)
Glucose, Bld: 169 mg/dL — ABNORMAL HIGH (ref 65–99)
Potassium: 3.4 mmol/L — ABNORMAL LOW (ref 3.5–5.1)
Sodium: 141 mmol/L (ref 135–145)
Total Bilirubin: 2.4 mg/dL — ABNORMAL HIGH (ref 0.3–1.2)
Total Protein: 4.3 g/dL — ABNORMAL LOW (ref 6.5–8.1)

## 2015-05-18 LAB — GLUCOSE, CAPILLARY
Glucose-Capillary: 136 mg/dL — ABNORMAL HIGH (ref 65–99)
Glucose-Capillary: 152 mg/dL — ABNORMAL HIGH (ref 65–99)

## 2015-05-18 LAB — PHOSPHORUS: Phosphorus: 2.7 mg/dL (ref 2.5–4.6)

## 2015-05-18 LAB — MAGNESIUM: Magnesium: 1.9 mg/dL (ref 1.7–2.4)

## 2015-05-18 LAB — TRIGLYCERIDES: Triglycerides: 126 mg/dL (ref <150)

## 2015-05-18 LAB — CULTURE, RESPIRATORY W GRAM STAIN

## 2015-05-18 SURGERY — LAPAROTOMY, EXPLORATORY
Anesthesia: General | Site: Abdomen

## 2015-05-18 MED ORDER — M.V.I. ADULT IV INJ
INJECTION | INTRAVENOUS | Status: AC
  Administered 2015-05-18: 17:00:00 via INTRAVENOUS
  Filled 2015-05-18: qty 3000

## 2015-05-18 MED ORDER — SODIUM CHLORIDE 0.9 % IV SOLN
1.0000 g | Freq: Once | INTRAVENOUS | Status: AC
  Administered 2015-05-18: 1 g via INTRAVENOUS
  Filled 2015-05-18: qty 10

## 2015-05-18 MED ORDER — ROCURONIUM BROMIDE 100 MG/10ML IV SOLN
INTRAVENOUS | Status: DC | PRN
  Administered 2015-05-18: 100 mg via INTRAVENOUS

## 2015-05-18 MED ORDER — MIDAZOLAM HCL 5 MG/5ML IJ SOLN
INTRAMUSCULAR | Status: DC | PRN
  Administered 2015-05-18: 2 mg via INTRAVENOUS

## 2015-05-18 MED ORDER — MIDAZOLAM HCL 2 MG/2ML IJ SOLN
2.0000 mg | INTRAMUSCULAR | Status: DC | PRN
  Administered 2015-05-19 (×6): 2 mg via INTRAVENOUS
  Filled 2015-05-18 (×6): qty 2

## 2015-05-18 MED ORDER — POTASSIUM CHLORIDE 10 MEQ/50ML IV SOLN
10.0000 meq | INTRAVENOUS | Status: AC
  Administered 2015-05-18 (×3): 10 meq via INTRAVENOUS
  Filled 2015-05-18 (×4): qty 50

## 2015-05-18 MED ORDER — HEPARIN (PORCINE) IN NACL 100-0.45 UNIT/ML-% IJ SOLN
2600.0000 [IU]/h | INTRAMUSCULAR | Status: DC
  Administered 2015-05-19: 2400 [IU]/h via INTRAVENOUS
  Filled 2015-05-18 (×3): qty 250

## 2015-05-18 MED ORDER — ALBUMIN HUMAN 5 % IV SOLN
INTRAVENOUS | Status: DC | PRN
  Administered 2015-05-18: 12:00:00 via INTRAVENOUS

## 2015-05-18 MED ORDER — LACTATED RINGERS IV SOLN
INTRAVENOUS | Status: DC | PRN
  Administered 2015-05-18: 12:00:00 via INTRAVENOUS

## 2015-05-18 MED ORDER — FENTANYL CITRATE (PF) 250 MCG/5ML IJ SOLN
INTRAMUSCULAR | Status: DC | PRN
  Administered 2015-05-18: 250 ug via INTRAVENOUS

## 2015-05-18 MED ORDER — 0.9 % SODIUM CHLORIDE (POUR BTL) OPTIME
TOPICAL | Status: DC | PRN
  Administered 2015-05-18 (×2): 1000 mL

## 2015-05-18 MED ORDER — MIDAZOLAM HCL 2 MG/2ML IJ SOLN
INTRAMUSCULAR | Status: AC
  Filled 2015-05-18: qty 2

## 2015-05-18 SURGICAL SUPPLY — 63 items
APL SKNCLS STERI-STRIP NONHPOA (GAUZE/BANDAGES/DRESSINGS) ×4
BENZOIN TINCTURE PRP APPL 2/3 (GAUZE/BANDAGES/DRESSINGS) ×2 IMPLANT
BLADE SURG ROTATE 9660 (MISCELLANEOUS) IMPLANT
CANISTER SUCTION 2500CC (MISCELLANEOUS) ×3 IMPLANT
CANISTER WOUND CARE 500ML ATS (WOUND CARE) ×3 IMPLANT
CATH MALECOT BARD  24FR (CATHETERS) ×1
CATH MALECOT BARD 24FR (CATHETERS) IMPLANT
CHLORAPREP W/TINT 26ML (MISCELLANEOUS) ×3 IMPLANT
COVER MAYO STAND STRL (DRAPES) IMPLANT
COVER SURGICAL LIGHT HANDLE (MISCELLANEOUS) ×3 IMPLANT
DRAPE LAPAROSCOPIC ABDOMINAL (DRAPES) ×3 IMPLANT
DRAPE PROXIMA HALF (DRAPES) IMPLANT
DRAPE UTILITY XL STRL (DRAPES) ×6 IMPLANT
DRAPE WARM FLUID 44X44 (DRAPE) ×3 IMPLANT
DRSG OPSITE POSTOP 4X10 (GAUZE/BANDAGES/DRESSINGS) IMPLANT
DRSG OPSITE POSTOP 4X8 (GAUZE/BANDAGES/DRESSINGS) IMPLANT
DRSG VERSA FOAM LRG 10X15 (GAUZE/BANDAGES/DRESSINGS) IMPLANT
ELECT BLADE 6.5 EXT (BLADE) IMPLANT
ELECT CAUTERY BLADE 6.4 (BLADE) ×6 IMPLANT
ELECT REM PT RETURN 9FT ADLT (ELECTROSURGICAL) ×3
ELECTRODE REM PT RTRN 9FT ADLT (ELECTROSURGICAL) ×2 IMPLANT
GLOVE BIO SURGEON STRL SZ 6.5 (GLOVE) ×2 IMPLANT
GLOVE BIO SURGEON STRL SZ8 (GLOVE) ×3 IMPLANT
GLOVE BIOGEL PI IND STRL 6.5 (GLOVE) IMPLANT
GLOVE BIOGEL PI IND STRL 7.0 (GLOVE) IMPLANT
GLOVE BIOGEL PI IND STRL 7.5 (GLOVE) IMPLANT
GLOVE BIOGEL PI IND STRL 8 (GLOVE) ×2 IMPLANT
GLOVE BIOGEL PI INDICATOR 6.5 (GLOVE) ×1
GLOVE BIOGEL PI INDICATOR 7.0 (GLOVE) ×3
GLOVE BIOGEL PI INDICATOR 7.5 (GLOVE) ×1
GLOVE BIOGEL PI INDICATOR 8 (GLOVE) ×1
GLOVE SURG SS PI 6.5 STRL IVOR (GLOVE) ×1 IMPLANT
GLOVE SURG SS PI 7.0 STRL IVOR (GLOVE) ×1 IMPLANT
GOWN STRL REUS W/ TWL LRG LVL3 (GOWN DISPOSABLE) ×4 IMPLANT
GOWN STRL REUS W/ TWL XL LVL3 (GOWN DISPOSABLE) ×2 IMPLANT
GOWN STRL REUS W/TWL LRG LVL3 (GOWN DISPOSABLE) ×9
GOWN STRL REUS W/TWL XL LVL3 (GOWN DISPOSABLE) ×3
KIT BASIN OR (CUSTOM PROCEDURE TRAY) ×3 IMPLANT
KIT ROOM TURNOVER OR (KITS) ×3 IMPLANT
LIGASURE IMPACT 36 18CM CVD LR (INSTRUMENTS) IMPLANT
NS IRRIG 1000ML POUR BTL (IV SOLUTION) ×6 IMPLANT
PACK GENERAL/GYN (CUSTOM PROCEDURE TRAY) ×3 IMPLANT
PAD ARMBOARD 7.5X6 YLW CONV (MISCELLANEOUS) ×3 IMPLANT
PENCIL BUTTON HOLSTER BLD 10FT (ELECTRODE) IMPLANT
SPECIMEN JAR LARGE (MISCELLANEOUS) IMPLANT
SPONGE ABDOMINAL VAC ABTHERA (MISCELLANEOUS) ×3 IMPLANT
SPONGE LAP 18X18 X RAY DECT (DISPOSABLE) IMPLANT
STAPLER VISISTAT 35W (STAPLE) ×3 IMPLANT
SUCTION POOLE TIP (SUCTIONS) ×3 IMPLANT
SUT PDS AB 1 TP1 96 (SUTURE) ×6 IMPLANT
SUT SILK 2 0 SH (SUTURE) ×2 IMPLANT
SUT SILK 2 0 SH CR/8 (SUTURE) ×3 IMPLANT
SUT SILK 2 0 TIES 10X30 (SUTURE) ×3 IMPLANT
SUT SILK 3 0 SH CR/8 (SUTURE) ×3 IMPLANT
SUT SILK 3 0 TIES 10X30 (SUTURE) ×3 IMPLANT
SWAB COLLECTION DEVICE MRSA (MISCELLANEOUS) ×1 IMPLANT
SYRINGE IRR TOOMEY STRL 70CC (SYRINGE) ×1 IMPLANT
TOWEL OR 17X24 6PK STRL BLUE (TOWEL DISPOSABLE) ×3 IMPLANT
TOWEL OR 17X26 10 PK STRL BLUE (TOWEL DISPOSABLE) ×3 IMPLANT
TRAY FOLEY CATH 16FRSI W/METER (SET/KITS/TRAYS/PACK) IMPLANT
TUBE ANAEROBIC SPECIMEN COL (MISCELLANEOUS) ×1 IMPLANT
TUBE CONNECTING 12X1/4 (SUCTIONS) IMPLANT
YANKAUER SUCT BULB TIP NO VENT (SUCTIONS) IMPLANT
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
## 2015-05-18 NOTE — Anesthesia Preprocedure Evaluation (Signed)
Anesthesia Evaluation  Patient identified by MRN, date of birth, ID band Patient unresponsive    Reviewed: Unable to perform ROS - Chart review only  Airway Mallampati: Intubated       Dental   Pulmonary    breath sounds clear to auscultation       Cardiovascular  Rhythm:Regular     Neuro/Psych    GI/Hepatic   Endo/Other    Renal/GU      Musculoskeletal   Abdominal   Peds  Hematology   Anesthesia Other Findings   Reproductive/Obstetrics                             Anesthesia Physical Anesthesia Plan  ASA: [name]  Anesthesia Plan: General   Post-op Pain Management:    Induction: Inhalational  Airway Management Planned: Oral ETT  Additional Equipment: None  Intra-op Plan:   Post-operative Plan: Post-operative intubation/ventilation  Informed Consent:   Plan Discussed with: Anesthesiologist, Surgeon and CRNA  Anesthesia Plan Comments:         Anesthesia Quick Evaluation

## 2015-05-18 NOTE — Progress Notes (Signed)
ANTICOAGULATION CONSULT NOTE - Follow-up  Pharmacy Consult for Heparin  Indication: DVT  Allergies  Allergen Reactions  . Shellfish Allergy Anaphylaxis    Patient Measurements: Height: 5\' 10"  (177.8 cm) Weight: 228 lb 9.9 oz (103.7 kg) IBW/kg (Calculated) : 73  Vital Signs: Temp: 99.5 F (37.5 C) (10/27 1110) Temp Source: Axillary (10/27 1110) BP: 106/54 mmHg (10/27 1113) Pulse Rate: 76 (10/27 1113)  Labs:  Recent Labs  05/16/15 0515 05/17/15 0445 05/17/15 1442 05/17/15 2320 05/18/15 0633 05/18/15 0634  HGB 8.8* 9.4*  --   --   --   --   HCT 27.3* 29.3*  --   --   --   --   PLT 345 339  --   --   --   --   LABPROT  --  15.4*  --   --   --   --   INR  --  1.20  --   --   --   --   HEPARINUNFRC  --   --  <0.10* <0.10*  --  0.17*  CREATININE 0.80 0.92  --   --  0.75  --     Estimated Creatinine Clearance: 152.5 mL/min (by C-G formula based on Cr of 0.75).  Assessment: 39 y/o M previously on heparin gtt for DVT but this was discontinued this AM for surgery. Pt is now post-op ex lap and debridement to resume heparin at 0100 tomorrow without a bolus.   Goal of Therapy:  Heparin level 0.3-0.7 units/ml Monitor platelets by anticoagulation protocol: Yes   Plan:  - Resume heparin gtt at 2400 units/hr tomorrow at 0100 - Check an 8 hour heparin level - Daily heparin level and CBC  Lysle Pearlachel Ebelyn Bohnet, PharmD, BCPS Pager # [phone] 05/18/2015 1:21 PM

## 2015-05-18 NOTE — Progress Notes (Signed)
Plan is for pt to go back to OR today.  Pt remains on full support tolerating well.

## 2015-05-18 NOTE — Transfer of Care (Signed)
Immediate Anesthesia Transfer of Care Note  Patient: Raymond Bowen  Procedure(s) Performed: Procedure(s): EXPLORATORY LAPAROTOMY (N/A) ABDOMINAL VACUUM ASSISTED CLOSURE CHANGE (N/A) GASTROSTOMY TUBE (N/A) DEBRIDEMENT ABDOMINAL WALL  Patient Location: ICU  Anesthesia Type:General  Level of Consciousness: sedated and unresponsive  Airway & Oxygen Therapy: Patient remains intubated per anesthesia plan and Patient placed on Ventilator (see vital sign flow sheet for setting)  Post-op Assessment: Report given to RN and Post -op Vital signs reviewed and stable  Post vital signs: Reviewed and stable  Last Vitals:  Filed Vitals:   05/18/15 1113  BP: 106/54  Pulse: 76  Temp:   Resp: 24    Complications: No apparent anesthesia complications

## 2015-05-18 NOTE — Anesthesia Postprocedure Evaluation (Signed)
  Anesthesia Post-op Note  Patient: Raymond Bowen  Procedure(s) Performed: Procedure(s): EXPLORATORY LAPAROTOMY (N/A) ABDOMINAL VACUUM ASSISTED CLOSURE CHANGE (N/A) GASTROSTOMY TUBE (N/A) DEBRIDEMENT ABDOMINAL WALL  Patient Location: ICU  Anesthesia Type:General  Level of Consciousness: sedated  Airway and Oxygen Therapy: Patient remains intubated per anesthesia plan  Post-op Pain: none  Post-op Assessment: Post-op Vital signs reviewed, Patient's Cardiovascular Status Stable, Respiratory Function Stable, Patent Airway and Pain level controlled LLE Motor Response: Purposeful movement, Responds to commands   RLE Motor Response: Purposeful movement, Responds to commands        Post-op Vital Signs: Reviewed and stable  Last Vitals:  Filed Vitals:   05/18/15 1430  BP: 106/57  Pulse: 69  Temp:   Resp: 21    Complications: No apparent anesthesia complications

## 2015-05-18 NOTE — Progress Notes (Signed)
ANTICOAGULATION CONSULT NOTE - Follow Up Consult  Pharmacy Consult for heparin Indication: DVT   Labs:  Recent Labs  05/15/15 1210 05/16/15 0515 05/17/15 0445 05/17/15 1442 05/17/15 2320  HGB 9.5* 8.8* 9.4*  --   --   HCT 28.4* 27.3* 29.3*  --   --   PLT 310 345 339  --   --   LABPROT  --   --  15.4*  --   --   INR  --   --  1.20  --   --   HEPARINUNFRC  --   --   --  <0.10* <0.10*  CREATININE 0.91 0.80 0.92  --   --      Assessment: 39yo male remains undetectable on heparin after rate increase.  Goal of Therapy:  Heparin level 0.3-0.7 units/ml   Plan:  Will increase heparin gtt by 4 units/kg/hr to 2100 units/hr and check level in 6hr.  Raymond GamblesVeronda Swayzie Choate, PharmD, BCPS  05/18/2015,12:04 AM

## 2015-05-18 NOTE — Progress Notes (Signed)
Patient ID: Raymond Bowen, male   DOB: [birth year], 39 y.o.   MRN: [id number] I met with his mother at the bedside. I discussed his current plan of care and my operative plan today for ex lap, G-tube placement, possible ileostomy, and replacement of open abdomen VAC. I discussed the procedure/risks/benefits and she agrees. Georganna Skeans, MD, MPH, FACS Trauma: [phone] General Surgery: [phone]

## 2015-05-18 NOTE — Progress Notes (Signed)
ANTICOAGULATION CONSULT NOTE - Follow Up Consult  Pharmacy Consult for heparin Indication: DVT   Labs:  Recent Labs  05/15/15 1210 05/16/15 0515 05/17/15 0445 05/17/15 1442 05/17/15 2320 05/18/15 0634  HGB 9.5* 8.8* 9.4*  --   --   --   HCT 28.4* 27.3* 29.3*  --   --   --   PLT 310 345 339  --   --   --   LABPROT  --   --  15.4*  --   --   --   INR  --   --  1.20  --   --   --   HEPARINUNFRC  --   --   --  <0.10* <0.10* 0.17*  CREATININE 0.91 0.80 0.92  --   --   --      Assessment: 39yo male remains subtherapeutic on heparin after rate increase though closer to goal  Goal of Therapy:  Heparin level 0.3-0.7 units/ml   Plan:  Will increase heparin gtt by 3 units/kg/hr to 2400 units/hr and check level in 6hr.  Vernard GamblesVeronda Aquinnah Devin, PharmD, BCPS  05/18/2015,7:28 AM

## 2015-05-19 ENCOUNTER — Inpatient Hospital Stay (HOSPITAL_COMMUNITY): Payer: No Typology Code available for payment source

## 2015-05-19 ENCOUNTER — Encounter (HOSPITAL_COMMUNITY): Payer: Self-pay | Admitting: General Surgery

## 2015-05-19 LAB — BASIC METABOLIC PANEL
Anion gap: 8 (ref 5–15)
BUN: 15 mg/dL (ref 6–20)
CO2: 25 mmol/L (ref 22–32)
Calcium: 7.6 mg/dL — ABNORMAL LOW (ref 8.9–10.3)
Chloride: 113 mmol/L — ABNORMAL HIGH (ref 101–111)
Creatinine, Ser: 0.71 mg/dL (ref 0.61–1.24)
GFR calc Af Amer: >60 mL/min (ref >60)
GFR calc non Af Amer: >60 mL/min (ref >60)
Glucose, Bld: 192 mg/dL — ABNORMAL HIGH (ref 65–99)
Potassium: 3.5 mmol/L (ref 3.5–5.1)
Sodium: 146 mmol/L — ABNORMAL HIGH (ref 135–145)

## 2015-05-19 LAB — BLOOD GAS, ARTERIAL
Acid-Base Excess: 0 mmol/L (ref 0.0–2.0)
Bicarbonate: 24.5 mEq/L — ABNORMAL HIGH (ref 20.0–24.0)
Drawn by: 44135
FIO2: 0.4
MECHVT: 580 mL
O2 Saturation: 99.4 %
PEEP: 5 cmH2O
Patient temperature: 99.6
RATE: 16 resp/min
TCO2: 25.9 mmol/L (ref 0–100)
pCO2 arterial: 44.1 mmHg (ref 35.0–45.0)
pH, Arterial: 7.367 (ref 7.350–7.450)
pO2, Arterial: 314 mmHg — ABNORMAL HIGH (ref 80.0–100.0)

## 2015-05-19 LAB — CBC
HCT: 24 % — ABNORMAL LOW (ref 39.0–52.0)
Hemoglobin: 8.2 g/dL — ABNORMAL LOW (ref 13.0–17.0)
MCH: 30.3 pg (ref 26.0–34.0)
MCHC: 34.2 g/dL (ref 30.0–36.0)
MCV: 88.6 fL (ref 78.0–100.0)
Platelets: 324 10*3/uL (ref 150–400)
RBC: 2.71 MIL/uL — ABNORMAL LOW (ref 4.22–5.81)
RDW: 17.1 % — ABNORMAL HIGH (ref 11.5–15.5)
WBC: 24.9 10*3/uL — ABNORMAL HIGH (ref 4.0–10.5)

## 2015-05-19 LAB — HEPARIN LEVEL (UNFRACTIONATED)
Heparin Unfractionated: 0.23 IU/mL — ABNORMAL LOW (ref 0.30–0.70)
Heparin Unfractionated: 0.17 IU/mL — ABNORMAL LOW (ref 0.30–0.70)

## 2015-05-19 MED ORDER — MIDAZOLAM HCL 5 MG/ML IJ SOLN
1.0000 mg/h | INTRAMUSCULAR | Status: DC
  Administered 2015-05-19: 0.5 mg/h via INTRAVENOUS
  Administered 2015-05-20: 4.5 mg/h via INTRAVENOUS
  Administered 2015-05-20: 2 mg/h via INTRAVENOUS
  Administered 2015-05-20: 5 mg/h via INTRAVENOUS
  Administered 2015-05-20: 2 mg/h via INTRAVENOUS
  Administered 2015-05-21 (×2): 5 mg/h via INTRAVENOUS
  Administered 2015-05-22: 4 mg/h via INTRAVENOUS
  Administered 2015-05-22 – 2015-06-04 (×28): 5 mg/h via INTRAVENOUS
  Administered 2015-06-04: 7 mg/h via INTRAVENOUS
  Administered 2015-06-05 (×4): 10 mg/h via INTRAVENOUS
  Filled 2015-05-19 (×42): qty 10

## 2015-05-19 MED ORDER — MIDAZOLAM HCL 2 MG/2ML IJ SOLN
2.0000 mg | INTRAMUSCULAR | Status: DC | PRN
  Administered 2015-05-19 – 2015-05-31 (×4): 2 mg via INTRAVENOUS
  Administered 2015-05-31: 5 mg via INTRAVENOUS
  Administered 2015-05-31: 2 mg via INTRAVENOUS
  Administered 2015-06-02 – 2015-06-07 (×3): 5 mg via INTRAVENOUS
  Filled 2015-05-19 (×2): qty 2

## 2015-05-19 MED ORDER — MIDAZOLAM HCL 2 MG/2ML IJ SOLN
2.0000 mg | INTRAMUSCULAR | Status: DC | PRN
  Filled 2015-05-19: qty 2

## 2015-05-19 MED ORDER — HEPARIN (PORCINE) IN NACL 100-0.45 UNIT/ML-% IJ SOLN
2900.0000 [IU]/h | INTRAMUSCULAR | Status: DC
  Administered 2015-05-19: 2600 [IU]/h via INTRAVENOUS
  Administered 2015-05-19 – 2015-05-21 (×5): 2900 [IU]/h via INTRAVENOUS
  Filled 2015-05-19 (×7): qty 250

## 2015-05-19 MED ORDER — FENTANYL CITRATE (PF) 2500 MCG/50ML IJ SOLN
25.0000 ug/h | Status: DC
  Administered 2015-05-19: 50 ug/h via INTRAVENOUS
  Administered 2015-05-20: 25 ug/h via INTRAVENOUS
  Administered 2015-05-20: 400 ug/h via INTRAVENOUS
  Administered 2015-05-20: 200 ug/h via INTRAVENOUS
  Administered 2015-05-21: 400 ug/h via INTRAVENOUS
  Administered 2015-05-21: 50 ug/h via INTRAVENOUS
  Administered 2015-05-21: 09:00:00 via INTRAVENOUS
  Administered 2015-05-22 – 2015-06-03 (×25): 400 ug/h via INTRAVENOUS
  Administered 2015-06-03: 80 ug/h via INTRAVENOUS
  Administered 2015-06-03: 100 ug/h via INTRAVENOUS
  Administered 2015-06-04 – 2015-06-06 (×4): 400 ug/h via INTRAVENOUS
  Administered 2015-06-07 – 2015-06-08 (×4): 300 ug/h via INTRAVENOUS
  Administered 2015-06-09 – 2015-06-14 (×10): 400 ug/h via INTRAVENOUS
  Administered 2015-06-15: 300 ug/h via INTRAVENOUS
  Administered 2015-06-15 – 2015-06-16 (×3): 400 ug/h via INTRAVENOUS
  Filled 2015-05-19 (×56): qty 100

## 2015-05-19 MED ORDER — TRACE MINERALS CR-CU-MN-SE-ZN 10-1000-500-60 MCG/ML IV SOLN
INTRAVENOUS | Status: AC
  Administered 2015-05-19: 17:00:00 via INTRAVENOUS
  Filled 2015-05-19: qty 3000

## 2015-05-19 NOTE — Progress Notes (Signed)
Follow up - Trauma and Critical Care  Patient Details:    Raymond Bowen is an 39 y.o. male.  Lines/tubes : Airway 7.5 mm (Active)  Secured at (cm) 25 cm 05/19/2015  7:54 AM  Measured From Lips 05/19/2015  7:33 AM  Secured Location Center 05/19/2015  7:33 AM  Secured By [employer] 05/19/2015  7:33 AM  Tube Holder Repositioned Yes 05/19/2015  7:33 AM  Cuff Pressure (cm H2O) 22 cm H2O 05/19/2015 12:47 AM  Site Condition Dry 05/19/2015  4:24 AM     PICC / Midline Double Lumen 05/15/15 PICC Right Brachial 45 cm 3 cm (Active)  Indication for Insertion or Continuance of Line Administration of hyperosmolar/irritating solutions (i.e. TPN, Vancomycin, etc.) 05/19/2015  8:00 AM  Exposed Catheter (cm) 3 cm 05/15/2015 12:00 PM  Site Assessment Clean;Dry;Intact 05/19/2015  8:00 AM  Lumen #1 Status Infusing 05/19/2015  8:00 AM  Lumen #2 Status Infusing 05/19/2015  8:00 AM  Dressing Type Transparent 05/19/2015  8:00 AM  Dressing Status Clean;Dry;Intact 05/19/2015  8:00 AM  Line Care Connections checked and tightened;Zeroed and calibrated 05/19/2015  8:00 AM  Line Adjustment (NICU/IV Team Only) No 05/19/2015  8:00 AM  Dressing Intervention New dressing 05/15/2015 10:00 PM  Dressing Change Due 05/22/15 05/19/2015  8:00 AM     CVC Double Lumen 05/16/15 Right Internal jugular 20 cm 0 cm (Active)  Indication for Insertion or Continuance of Line Vasoactive infusions 05/19/2015  8:00 AM  Site Assessment Clean;Dry;Intact 05/19/2015  8:00 AM  Proximal Lumen Status Infusing 05/19/2015  8:00 AM  Distal Lumen Status Infusing 05/19/2015  8:00 AM  Dressing Type Transparent 05/19/2015  8:00 AM  Dressing Status Clean;Dry;Intact 05/19/2015  8:00 AM  Line Care Connections checked and tightened;Zeroed and calibrated 05/19/2015  8:00 AM  Dressing Intervention New dressing;Antimicrobial disc changed 05/18/2015  6:00 PM  Dressing Change Due 05/25/15 05/19/2015  8:00 AM     Arterial Line 05/16/15  Right Radial (Active)  Site Assessment Clean;Dry;Intact 05/19/2015  8:00 AM  Line Status Pulsatile blood flow 05/19/2015  8:00 AM  Art Line Waveform Square wave test performed;Appropriate 05/19/2015  8:00 AM  Art Line Interventions Zeroed and calibrated;Leveled;Connections checked and tightened 05/19/2015  8:00 AM  Color/Movement/Sensation Capillary refill less than 3 sec 05/19/2015  8:00 AM  Dressing Type Transparent 05/19/2015  8:00 AM  Dressing Status Clean;Dry;Intact 05/19/2015  8:00 AM     Negative Pressure Wound Therapy Abdomen Left;Upper (Active)  Last dressing change 05/18/15 05/19/2015  7:54 AM  Site / Wound Assessment Dressing in place / Unable to assess 05/19/2015  7:54 AM  Peri-wound Assessment Intact 05/19/2015  7:54 AM  Cycle Continuous;On 05/19/2015  7:54 AM  Target Pressure (mmHg) 125 05/19/2015  7:54 AM  Canister Changed Yes 05/19/2015  9:49 AM  Dressing Status Intact 05/19/2015  7:54 AM  Drainage Amount Copious 05/19/2015  7:54 AM  Drainage Description Serosanguineous 05/19/2015  7:54 AM  Output (mL) 165 mL 05/19/2015  9:49 AM     Gastrostomy/Enterostomy Gastrostomy 24 Fr. LUQ (Active)  Surrounding Skin Unable to view 05/19/2015  7:54 AM  Tube Status Suction-low intermittent 05/19/2015  7:54 AM  Drainage Appearance Green 05/19/2015  7:54 AM  Dressing Status Clean;Dry;Intact 05/19/2015  7:54 AM  Dressing Intervention Dressing reinforced 05/19/2015  7:54 AM  Dressing Type Split gauze 05/19/2015  7:54 AM     Urethral Catheter [name], RN Latex 16 Fr. (Active)  Indication for Insertion or Continuance of Catheter Peri-operative use for selective surgical procedure 05/19/2015  7:54 AM  Site Assessment Clean;Intact;Dry 05/19/2015  7:54 AM  Catheter Maintenance Bag below level of bladder;Catheter secured;Drainage bag/tubing not touching floor;Insertion date on drainage bag;No dependent loops;Seal intact;Bag emptied prior to transport 05/19/2015  7:54 AM  Collection Container  Standard drainage bag 05/19/2015  7:54 AM  Securement Method Securing device (Describe) 05/19/2015  7:54 AM  Urinary Catheter Interventions Unclamped 05/19/2015  7:54 AM  Output (mL) 250 mL 05/19/2015  7:54 AM    Microbiology/Sepsis markers: Results for orders placed or performed during the hospital encounter of 05/07/15  MRSA PCR Screening     Status: None   Collection Time: 05/07/15 11:43 PM  Result Value Ref Range Status   MRSA by PCR NEGATIVE NEGATIVE Final    Comment:        The GeneXpert MRSA Assay (FDA approved for NASAL specimens only), is one component of a comprehensive MRSA colonization surveillance program. It is not intended to diagnose MRSA infection nor to guide or monitor treatment for MRSA infections.   Culture, Urine     Status: None   Collection Time: 05/14/15 11:22 AM  Result Value Ref Range Status   Specimen Description URINE, CATHETERIZED  Final   Special Requests NONE  Final   Culture NO GROWTH 1 DAY  Final   Report Status 05/15/2015 FINAL  Final  Culture, expectorated sputum-assessment     Status: None   Collection Time: 05/15/15  8:50 AM  Result Value Ref Range Status   Specimen Description SPUTUM  Final   Special Requests Normal  Final   Sputum evaluation   Final    THIS SPECIMEN IS ACCEPTABLE. RESPIRATORY CULTURE REPORT TO FOLLOW.   Report Status 05/15/2015 FINAL  Final  Culture, respiratory (NON-Expectorated)     Status: None   Collection Time: 05/15/15  8:50 AM  Result Value Ref Range Status   Specimen Description SPUTUM  Final   Special Requests NONE  Final   Gram Stain   Final    ABUNDANT WBC PRESENT, PREDOMINANTLY PMN RARE SQUAMOUS EPITHELIAL CELLS PRESENT MODERATE GRAM NEGATIVE RODS FEW GRAM POSITIVE COCCI IN PAIRS    Culture   Final    MODERATE PSEUDOMONAS AERUGINOSA MODERATE HAEMOPHILUS INFLUENZAE Note: BETA LACTAMASE NEGATIVE Performed at [facility]    Report Status 05/18/2015 FINAL  Final   Organism ID, Bacteria  PSEUDOMONAS AERUGINOSA  Final      Susceptibility   Pseudomonas aeruginosa - MIC*    CEFEPIME 2 SENSITIVE Sensitive     CEFTAZIDIME 4 SENSITIVE Sensitive     CIPROFLOXACIN <=0.25 SENSITIVE Sensitive     GENTAMICIN <=1 SENSITIVE Sensitive     IMIPENEM 1 SENSITIVE Sensitive     PIP/TAZO 8 SENSITIVE Sensitive     TOBRAMYCIN <=1 SENSITIVE Sensitive     * MODERATE PSEUDOMONAS AERUGINOSA  Culture, respiratory (NON-Expectorated)     Status: None (Preliminary result)   Collection Time: 05/17/15 12:25 PM  Result Value Ref Range Status   Specimen Description TRACHEAL ASPIRATE  Final   Special Requests Normal  Final   Gram Stain   Final    RARE WBC PRESENT, PREDOMINANTLY MONONUCLEAR RARE SQUAMOUS EPITHELIAL CELLS PRESENT NO ORGANISMS SEEN Performed at [facility]    Culture   Final    FEW PSEUDOMONAS AERUGINOSA Performed at [facility]    Report Status PENDING  Incomplete  Body fluid culture     Status: None (Preliminary result)   Collection Time: 05/18/15 12:30 PM  Result Value Ref Range Status  Specimen Description FLUID PERITONEAL  Final   Special Requests PT ON ERLAPENEM,MEROPENEM,ZOSYN  Final   Gram Stain   Final    FEW WBC PRESENT, PREDOMINANTLY PMN MODERATE GRAM POSITIVE RODS FEW GRAM POSITIVE COCCI IN PAIRS RARE GRAM NEGATIVE RODS    Culture PENDING  Incomplete   Report Status PENDING  Incomplete    Anti-infectives:  Anti-infectives    Start     Dose/Rate Route Frequency Ordered Stop   05/17/15 1000  ertapenem (INVANZ) 1 g in sodium chloride 0.9 % 50 mL IVPB  Status:  Discontinued     1 g 100 mL/hr over 30 Minutes Intravenous Every 24 hours 05/17/15 0936 05/17/15 0951   05/17/15 1000  meropenem (MERREM) 1 g in sodium chloride 0.9 % 100 mL IVPB     1 g 200 mL/hr over 30 Minutes Intravenous 3 times per day 05/17/15 0951     05/15/15 0815  piperacillin-tazobactam (ZOSYN) IVPB 3.375 g  Status:  Discontinued     3.375 g 12.5 mL/hr over 240 Minutes  Intravenous 3 times per day 05/15/15 0804 05/17/15 0951   05/08/15 0200  ceFAZolin (ANCEF) IVPB 2 g/50 mL premix     2 g 100 mL/hr over 30 Minutes Intravenous 3 times per day 05/08/15 0140 05/09/15 2238   05/08/15 0130  ceFAZolin (ANCEF) powder 2 g  Status:  Discontinued     2 g Other Every 8 hours 05/08/15 0129 05/08/15 0139      Best Practice/Protocols:  VTE Prophylaxis: Heparin (drip) and (for treatment of RLE DVT) GI Prophylaxis: Proton Pump Inhibitor Continous Sedation  Consults: Treatment Team:  Nada Libman, MD    Events:  Subjective:    Overnight Issues: Patient is stable.  On no pressors.  Objective:  Vital signs for last 24 hours: Temp:  [97.1 F (36.2 C)-101.8 F (38.8 C)] 97.1 F (36.2 C) (10/28 0800) Pulse Rate:  [66-91] 73 (10/28 1000) Resp:  [8-34] 8 (10/28 1000) BP: (96-148)/(45-98) 118/65 mmHg (10/28 1000) SpO2:  [76 %-100 %] 100 % (10/28 1000) Arterial Line BP: (84-158)/(48-79) 150/58 mmHg (10/28 1000) FiO2 (%):  [40 %] 40 % (10/28 0733)  Hemodynamic parameters for last 24 hours: CVP:  [8 mmHg-16 mmHg] 10 mmHg  Intake/Output from previous day: 10/27 0701 - 10/28 0700 In: 6071.1 [I.V.:2471.1; IV Piggyback:600; TPN:3000] Out: 3695 [Urine:2335; Emesis/NG output:50; Drains:1310]  Intake/Output this shift: Total I/O In: 693.4 [I.V.:318.4; TPN:375] Out: 415 [Urine:250; Drains:165]  Vent settings for last 24 hours: Vent Mode:  [-] PRVC FiO2 (%):  [40 %] 40 % Set Rate:  [16 bmp] 16 bmp Vt Set:  [580 mL] 580 mL PEEP:  [5 cmH20] 5 cmH20 Plateau Pressure:  [28 cmH20-31 cmH20] 28 cmH20  Physical Exam:  General: alert and no respiratory distress Neuro: alert and nonfocal exam Resp: clear to auscultation bilaterally CVS: regular rate and rhythm, S1, S2 normal, no murmur, click, rub or gallop and intermittent tachycardia GI: Open VAC with staining of the dressing.  Tense and very tender on the right side. Extremities: edema 4+, pulses doppler, ,  unequal size and bilateral DP Doppler pulses.  Results for orders placed or performed during the hospital encounter of 05/07/15 (from the past 24 hour(s))  Body fluid culture     Status: None (Preliminary result)   Collection Time: 05/18/15 12:30 PM  Result Value Ref Range   Specimen Description FLUID PERITONEAL    Special Requests PT ON ERLAPENEM,MEROPENEM,ZOSYN    Gram Stain  FEW WBC PRESENT, PREDOMINANTLY PMN MODERATE GRAM POSITIVE RODS FEW GRAM POSITIVE COCCI IN PAIRS RARE GRAM NEGATIVE RODS    Culture PENDING    Report Status PENDING   Blood gas, arterial     Status: Abnormal   Collection Time: 05/19/15  4:20 AM  Result Value Ref Range   FIO2 0.40    Delivery systems VENTILATOR    Mode PRESSURE REGULATED VOLUME CONTROL    VT 580 mL   LHR 16 resp/min   Peep/cpap 5.0 cm H20   pH, Arterial 7.367 7.350 - 7.450   pCO2 arterial 44.1 35.0 - 45.0 mmHg   pO2, Arterial 314 (H) 80.0 - 100.0 mmHg   Bicarbonate 24.5 (H) 20.0 - 24.0 mEq/L   TCO2 25.9 0 - 100 mmol/L   Acid-Base Excess 0.0 0.0 - 2.0 mmol/L   O2 Saturation 99.4 %   Patient temperature 99.6    Collection site ARTERIAL LINE    Drawn by [phone]    Sample type ARTERIAL DRAW    Allens test (pass/fail) PASS PASS  Basic metabolic panel     Status: Abnormal   Collection Time: 05/19/15  6:00 AM  Result Value Ref Range   Sodium 146 (H) 135 - 145 mmol/L   Potassium 3.5 3.5 - 5.1 mmol/L   Chloride 113 (H) 101 - 111 mmol/L   CO2 25 22 - 32 mmol/L   Glucose, Bld 192 (H) 65 - 99 mg/dL   BUN 15 6 - 20 mg/dL   Creatinine, Ser 6.04 0.61 - 1.24 mg/dL   Calcium 7.6 (L) 8.9 - 10.3 mg/dL   GFR calc non Af Amer >60 >60 mL/min   GFR calc Af Amer >60 >60 mL/min   Anion gap 8 5 - 15  CBC     Status: Abnormal   Collection Time: 05/19/15  6:00 AM  Result Value Ref Range   WBC 24.9 (H) 4.0 - 10.5 K/uL   RBC 2.71 (L) 4.22 - 5.81 MIL/uL   Hemoglobin 8.2 (L) 13.0 - 17.0 g/dL   HCT 54.0 (L) 98.1 - 19.1 %   MCV 88.6 78.0 - 100.0 fL    MCH 30.3 26.0 - 34.0 pg   MCHC 34.2 30.0 - 36.0 g/dL   RDW 47.8 (H) 29.5 - 62.1 %   Platelets 324 150 - 400 K/uL  Heparin level (unfractionated)     Status: Abnormal   Collection Time: 05/19/15  8:45 AM  Result Value Ref Range   Heparin Unfractionated 0.23 (L) 0.30 - 0.70 IU/mL     Assessment/Plan:   NEURO  Altered Mental Status:  agitation and sedation   Plan: Needs some additional sedation.  Will add Versed drip.  PULM  Atelectasis/collapse (bibasilar)   Plan: This will probably get better once the abdominal pressure is improved.  CARDIO  Sinus Tachycardia   Plan: Physiological.  No specific treatment  RENAL  Urine output is good.   Plan: CPM  GI  Bowel Trauma with bowel in discontinuity   Plan: CPM  ID  Pneumonia (hospital acquired (not ventilator-associated) Pseudomonas) Intra-Abdominal Abscess (post-operative and associated with dead bowel)   Plan: On Meropenem for all infections  HEME  Anemia acute blood loss anemia and anemia of critical illness)   Plan: No blood for now.  ENDO No specific issues.   Plan: CPM  Global Issues  Spoke with the mother about critical issues including need to go back to surgery on Sunday for washout and possible ileostomy and tracheostomy.  Will  get consent for those procedures.  Add Versed drip for additional sedation.    LOS: 12 days   Additional comments:I reviewed the patient's new clinical lab test results. cbc/bmet, I reviewed the patients new imaging test results. cxr and I have discussed and reviewed with family members patient's mother  Critical Care Total Time*: 45 Minutes  Arrionna Serena 05/19/2015  *Care during the described time interval was provided by me and/or other providers on the critical care team.  I have reviewed this patient's available data, including medical history, events of note, physical examination and test results as part of my evaluation.
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
## 2015-05-19 NOTE — Progress Notes (Signed)
ANTICOAGULATION CONSULT NOTE - Follow-up  Pharmacy Consult for Heparin  Indication: DVT  Allergies  Allergen Reactions  . Shellfish Allergy Anaphylaxis    Patient Measurements: Height: 5\' 10"  (177.8 cm) Weight: 228 lb 9.9 oz (103.7 kg) IBW/kg (Calculated) : 73  Vital Signs: Temp: 97.1 F (36.2 C) (10/28 0800) Temp Source: Axillary (10/28 0800) BP: 116/63 mmHg (10/28 0800) Pulse Rate: 67 (10/28 0800)  Labs:  Recent Labs  05/17/15 0445  05/17/15 2320 05/18/15 [id number] 05/18/15 0634 05/19/15 0600 05/19/15 0845  HGB 9.4*  --   --   --   --  8.2*  --   HCT 29.3*  --   --   --   --  24.0*  --   PLT 339  --   --   --   --  324  --   LABPROT 15.4*  --   --   --   --   --   --   INR 1.20  --   --   --   --   --   --   HEPARINUNFRC  --   < > <0.10*  --  0.17*  --  0.23*  CREATININE 0.92  --   --  0.75  --  0.71  --   < > = values in this interval not displayed.  Estimated Creatinine Clearance: 152.5 mL/min (by C-G formula based on Cr of 0.71).  Assessment: 39 y/o M resumed on heparin gtt for DVT s/p surgery yesterday. Initial heparin level is subtherapeutic at 0.23. H/H low but stable and platelets are WNL. No bleeding noted.   Goal of Therapy:  Heparin level 0.3-0.7 units/ml Monitor platelets by anticoagulation protocol: Yes   Plan:  - Increase heparin gtt to 2400 units/hr - Check an 8 hour heparin level - Daily heparin level and CBC - F/u plans for oral anticoag  Lysle Pearlachel Jernard Reiber, PharmD, BCPS Pager # [phone] 05/19/2015 9:26 AM

## 2015-05-19 NOTE — Progress Notes (Addendum)
Radiology called reporting ETT at [facility], ETT may need to be pulled back appx 2 cm, RT notified.   Addendum: Per Trauma, ETT should remain in current position.

## 2015-05-19 NOTE — Progress Notes (Signed)
ANTICOAGULATION CONSULT NOTE - Follow-up  Pharmacy Consult for Heparin  Indication: DVT  Allergies  Allergen Reactions  . Shellfish Allergy Anaphylaxis    Patient Measurements: Height: 5\' 10"  (177.8 cm) Weight: 228 lb 9.9 oz (103.7 kg) IBW/kg (Calculated) : 73  Vital Signs: Temp: 99.8 F (37.7 C) (10/28 1600) Temp Source: Axillary (10/28 1200) BP: 118/60 mmHg (10/28 1800) Pulse Rate: 75 (10/28 1800)  Labs:  Recent Labs  05/17/15 0445  05/18/15 [id number] 05/18/15 0634 05/19/15 0600 05/19/15 0845 05/19/15 1738  HGB 9.4*  --   --   --  8.2*  --   --   HCT 29.3*  --   --   --  24.0*  --   --   PLT 339  --   --   --  324  --   --   LABPROT 15.4*  --   --   --   --   --   --   INR 1.20  --   --   --   --   --   --   HEPARINUNFRC  --   < >  --  0.17*  --  0.23* 0.17*  CREATININE 0.92  --  0.75  --  0.71  --   --   < > = values in this interval not displayed.  Estimated Creatinine Clearance: 152.5 mL/min (by C-G formula based on Cr of 0.71).  Assessment: 39 y/o M resumed on heparin gtt for DVT s/p surgery yesterday. Initial heparin level was low, rate was increased but a repeat level remained subtherapeutic at 0.17 units/mL. Per RN, no issues with line, infusion was not held for any reason.  No bleeding noted.   Goal of Therapy:  Heparin level 0.3-0.7 units/ml Monitor platelets by anticoagulation protocol: Yes   Plan:  - Increase heparin gtt to 2900 units/hr - HL at 0200 - Daily heparin level and CBC   Ferry Matthis D. Marilene Vath, PharmD, BCPS Clinical Pharmacist Pager: [phone] 05/19/2015 6:25 PM

## 2015-05-20 LAB — BLOOD GAS, ARTERIAL
Acid-Base Excess: 0.6 mmol/L (ref 0.0–2.0)
Bicarbonate: 24.7 mEq/L — ABNORMAL HIGH (ref 20.0–24.0)
Drawn by: 39899
FIO2: 0.4
MECHVT: 500 mL
O2 Saturation: 95.7 %
PEEP: 5 cmH2O
Patient temperature: 98.6
RATE: 16 resp/min
TCO2: 25.9 mmol/L (ref 0–100)
pCO2 arterial: 39.5 mmHg (ref 35.0–45.0)
pH, Arterial: 7.413 (ref 7.350–7.450)
pO2, Arterial: 83.5 mmHg (ref 80.0–100.0)

## 2015-05-20 LAB — HEPARIN LEVEL (UNFRACTIONATED)
Heparin Unfractionated: 0.36 IU/mL (ref 0.30–0.70)
Heparin Unfractionated: 0.35 IU/mL (ref 0.30–0.70)

## 2015-05-20 LAB — CBC
HCT: 22.6 % — ABNORMAL LOW (ref 39.0–52.0)
Hemoglobin: 7.6 g/dL — ABNORMAL LOW (ref 13.0–17.0)
MCH: 29.6 pg (ref 26.0–34.0)
MCHC: 33.6 g/dL (ref 30.0–36.0)
MCV: 87.9 fL (ref 78.0–100.0)
Platelets: 387 10*3/uL (ref 150–400)
RBC: 2.57 MIL/uL — ABNORMAL LOW (ref 4.22–5.81)
RDW: 17 % — ABNORMAL HIGH (ref 11.5–15.5)
WBC: 26.6 10*3/uL — ABNORMAL HIGH (ref 4.0–10.5)

## 2015-05-20 LAB — CULTURE, RESPIRATORY W GRAM STAIN

## 2015-05-20 LAB — CULTURE, RESPIRATORY: Special Requests: NORMAL

## 2015-05-20 MED ORDER — DEXMEDETOMIDINE HCL IN NACL 400 MCG/100ML IV SOLN
0.4000 ug/kg/h | INTRAVENOUS | Status: DC
  Administered 2015-05-20 – 2015-05-21 (×3): 0.6 ug/kg/h via INTRAVENOUS
  Administered 2015-05-22 (×3): 0.7 ug/kg/h via INTRAVENOUS
  Administered 2015-05-22 (×2): 0.8 ug/kg/h via INTRAVENOUS
  Administered 2015-05-23 (×2): 0.7 ug/kg/h via INTRAVENOUS
  Administered 2015-05-23: 0.9 ug/kg/h via INTRAVENOUS
  Administered 2015-05-23 (×2): 0.7 ug/kg/h via INTRAVENOUS
  Administered 2015-05-24 – 2015-05-25 (×13): 1.2 ug/kg/h via INTRAVENOUS
  Administered 2015-05-25: 18:00:00 via INTRAVENOUS
  Administered 2015-05-25 – 2015-06-06 (×65): 1.2 ug/kg/h via INTRAVENOUS
  Administered 2015-06-06: 0.8 ug/kg/h via INTRAVENOUS
  Administered 2015-06-06 (×2): 1.2 ug/kg/h via INTRAVENOUS
  Administered 2015-06-07 – 2015-06-09 (×9): 0.8 ug/kg/h via INTRAVENOUS
  Administered 2015-06-09: 0.5 ug/kg/h via INTRAVENOUS
  Administered 2015-06-09: 0.8 ug/kg/h via INTRAVENOUS
  Administered 2015-06-10 (×2): 0.721 ug/kg/h via INTRAVENOUS
  Administered 2015-06-10 (×2): 0.8 ug/kg/h via INTRAVENOUS
  Administered 2015-06-11: 0.721 ug/kg/h via INTRAVENOUS
  Administered 2015-06-11: 0.8 ug/kg/h via INTRAVENOUS
  Administered 2015-06-11: 0.721 ug/kg/h via INTRAVENOUS
  Administered 2015-06-11 – 2015-06-12 (×5): 0.8 ug/kg/h via INTRAVENOUS
  Filled 2015-05-20 (×10): qty 100
  Filled 2015-05-20: qty 50
  Filled 2015-05-20 (×26): qty 100
  Filled 2015-05-20: qty 200
  Filled 2015-05-20 (×16): qty 100
  Filled 2015-05-20: qty 200
  Filled 2015-05-20 (×23): qty 100
  Filled 2015-05-20: qty 200
  Filled 2015-05-20 (×5): qty 100
  Filled 2015-05-20: qty 300
  Filled 2015-05-20 (×5): qty 100
  Filled 2015-05-20: qty 200
  Filled 2015-05-20 (×2): qty 100
  Filled 2015-05-20: qty 200
  Filled 2015-05-20 (×8): qty 100
  Filled 2015-05-20: qty 200
  Filled 2015-05-20 (×7): qty 100
  Filled 2015-05-20: qty 50
  Filled 2015-05-20: qty 100
  Filled 2015-05-20: qty 200
  Filled 2015-05-20 (×3): qty 100

## 2015-05-20 MED ORDER — PIPERACILLIN-TAZOBACTAM 3.375 G IVPB
3.3750 g | Freq: Three times a day (TID) | INTRAVENOUS | Status: DC
  Administered 2015-05-20 – 2015-05-31 (×33): 3.375 g via INTRAVENOUS
  Filled 2015-05-20 (×35): qty 50

## 2015-05-20 MED ORDER — TRACE MINERALS CR-CU-MN-SE-ZN 10-1000-500-60 MCG/ML IV SOLN
INTRAVENOUS | Status: AC
  Administered 2015-05-20: 18:00:00 via INTRAVENOUS
  Filled 2015-05-20: qty 3000
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
## 2015-05-20 NOTE — Progress Notes (Signed)
Called Dr Lindie SpruceWyatt regarding pt fluid volume status. Checking CVPs q4. Pt still remarkably edematous and rhonci lung sounds. Talked with Dr Lindie SpruceWyatt about hourly fluid volume intake. Called pharmacy to see if double strength drips were available to decrease hourly intake. Pharmacy changed fentanyl to more concentrated dose. Will continue to monitor.
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
## 2015-05-20 NOTE — Progress Notes (Signed)
Patients ventilator keeps alarming on and off and patient agitated and biting tube intermittenly, with on and off gagging and coughing. Patient able to follow command, respiratory in and suctionned multiple times obtained only small amount of secretions from the tube and orally. Given several boluses of versed and fentanyl with relief for short period. Patient tachypneic on and off and some nasal flaring noted, RT suggested more sedation on patient. Pricedex drip was restarted. Dr. Gwinda Passesuie  made aware of patients symptoms and with order to maintain the drip and keep patient calm and sedated. Will continue to monitor patient.

## 2015-05-20 NOTE — Progress Notes (Signed)
Wasted 150ml fentanyl gtt in sink. Juleen Starraryn Hutton, RN, witnessed.

## 2015-05-20 NOTE — Progress Notes (Signed)
ANTICOAGULATION CONSULT NOTE - Follow Up Consult  Pharmacy Consult for Heparin  Indication: DVT  Allergies  Allergen Reactions  . Shellfish Allergy Anaphylaxis    Patient Measurements: Height: 5\' 10"  (177.8 cm) Weight: 228 lb 9.9 oz (103.7 kg) IBW/kg (Calculated) : 73  Vital Signs: Temp: 98.7 F (37.1 C) (10/29 0000) Temp Source: Axillary (10/29 0000) BP: 107/47 mmHg (10/29 0200) Pulse Rate: 73 (10/29 0230)  Labs:  Recent Labs  05/17/15 0445  05/18/15 0633  05/19/15 0600 05/19/15 0845 05/19/15 1738 05/20/15 0215  HGB 9.4*  --   --   --  8.2*  --   --  7.6*  HCT 29.3*  --   --   --  24.0*  --   --  22.6*  PLT 339  --   --   --  324  --   --  PENDING  LABPROT 15.4*  --   --   --   --   --   --   --   INR 1.20  --   --   --   --   --   --   --   HEPARINUNFRC  --   < >  --   < >  --  0.23* 0.17* 0.36  CREATININE 0.92  --  0.75  --  0.71  --   --   --   < > = values in this interval not displayed.  Estimated Creatinine Clearance: 152.5 mL/min (by C-G formula based on Cr of 0.71).  Assessment: Therapeutic heparin level x 1 after rate increase  Goal of Therapy:  Heparin level 0.3-0.7 units/ml Monitor platelets by anticoagulation protocol: Yes   Plan:  -Continue heparin at 2900 units/hr -0800 HL  Cohan Stipes 05/20/2015,2:48 AM

## 2015-05-21 ENCOUNTER — Inpatient Hospital Stay (HOSPITAL_COMMUNITY): Payer: No Typology Code available for payment source | Admitting: Anesthesiology

## 2015-05-21 ENCOUNTER — Encounter (HOSPITAL_COMMUNITY): Admission: EM | Disposition: A | Discharge status: Rehabilitation Facility | Payer: Self-pay | Source: Home / Self Care

## 2015-05-21 ENCOUNTER — Encounter (HOSPITAL_COMMUNITY): Payer: Self-pay | Admitting: General Practice

## 2015-05-21 HISTORY — PX: ILEOSTOMY: SHX1783

## 2015-05-21 HISTORY — PX: LAPAROTOMY: SHX154

## 2015-05-21 HISTORY — PX: TRACHEOSTOMY TUBE PLACEMENT: SHX814

## 2015-05-21 LAB — BODY FLUID CULTURE

## 2015-05-21 LAB — PREPARE RBC (CROSSMATCH)

## 2015-05-21 LAB — BASIC METABOLIC PANEL
Anion gap: 6 (ref 5–15)
BUN: 15 mg/dL (ref 6–20)
CO2: 27 mmol/L (ref 22–32)
Calcium: 7.5 mg/dL — ABNORMAL LOW (ref 8.9–10.3)
Chloride: 105 mmol/L (ref 101–111)
Creatinine, Ser: 0.77 mg/dL (ref 0.61–1.24)
GFR calc Af Amer: >60 mL/min (ref >60)
GFR calc non Af Amer: >60 mL/min (ref >60)
Glucose, Bld: 151 mg/dL — ABNORMAL HIGH (ref 65–99)
Potassium: 3.9 mmol/L (ref 3.5–5.1)
Sodium: 138 mmol/L (ref 135–145)

## 2015-05-21 LAB — CBC
HCT: 22.5 % — ABNORMAL LOW (ref 39.0–52.0)
Hemoglobin: 7.4 g/dL — ABNORMAL LOW (ref 13.0–17.0)
MCH: 29.2 pg (ref 26.0–34.0)
MCHC: 32.9 g/dL (ref 30.0–36.0)
MCV: 88.9 fL (ref 78.0–100.0)
Platelets: 477 10*3/uL — ABNORMAL HIGH (ref 150–400)
RBC: 2.53 MIL/uL — ABNORMAL LOW (ref 4.22–5.81)
RDW: 16.7 % — ABNORMAL HIGH (ref 11.5–15.5)
WBC: 29.4 10*3/uL — ABNORMAL HIGH (ref 4.0–10.5)

## 2015-05-21 LAB — HEPARIN LEVEL (UNFRACTIONATED)
Heparin Unfractionated: 0.34 IU/mL (ref 0.30–0.70)
Heparin Unfractionated: 0.48 IU/mL (ref 0.30–0.70)

## 2015-05-21 SURGERY — CREATION, ILEOSTOMY
Anesthesia: General | Site: Neck

## 2015-05-21 MED ORDER — LACTATED RINGERS IV SOLN
INTRAVENOUS | Status: DC | PRN
  Administered 2015-05-21: 08:00:00 via INTRAVENOUS

## 2015-05-21 MED ORDER — VECURONIUM BROMIDE 10 MG IV SOLR
INTRAVENOUS | Status: AC
  Filled 2015-05-21: qty 10

## 2015-05-21 MED ORDER — HEPARIN (PORCINE) IN NACL 100-0.45 UNIT/ML-% IJ SOLN
3700.0000 [IU]/h | INTRAMUSCULAR | Status: DC
  Administered 2015-05-21 – 2015-06-01 (×24): 2900 [IU]/h via INTRAVENOUS
  Administered 2015-06-02 (×2): 3400 [IU]/h via INTRAVENOUS
  Administered 2015-06-02: 2900 [IU]/h via INTRAVENOUS
  Administered 2015-06-03 (×3): 3400 [IU]/h via INTRAVENOUS
  Administered 2015-06-04: 3700 [IU]/h via INTRAVENOUS
  Filled 2015-05-21 (×57): qty 250

## 2015-05-21 MED ORDER — 0.9 % SODIUM CHLORIDE (POUR BTL) OPTIME
TOPICAL | Status: DC | PRN
  Administered 2015-05-21 (×3): 1000 mL

## 2015-05-21 MED ORDER — ROCURONIUM BROMIDE 100 MG/10ML IV SOLN
INTRAVENOUS | Status: DC | PRN
  Administered 2015-05-21 (×2): 50 mg via INTRAVENOUS

## 2015-05-21 MED ORDER — ROCURONIUM BROMIDE 50 MG/5ML IV SOLN
INTRAVENOUS | Status: AC
  Filled 2015-05-21: qty 2

## 2015-05-21 MED ORDER — VECURONIUM BROMIDE 10 MG IV SOLR
INTRAVENOUS | Status: DC | PRN
  Administered 2015-05-21 (×2): 10 mg via INTRAVENOUS

## 2015-05-21 MED ORDER — SODIUM CHLORIDE 0.9 % IV SOLN
INTRAVENOUS | Status: DC | PRN
  Administered 2015-05-21: 09:00:00 via INTRAVENOUS

## 2015-05-21 MED ORDER — TRACE MINERALS CR-CU-MN-SE-ZN 10-1000-500-60 MCG/ML IV SOLN
INTRAVENOUS | Status: AC
  Administered 2015-05-21: 18:00:00 via INTRAVENOUS
  Filled 2015-05-21: qty 3000

## 2015-05-21 MED ORDER — SODIUM CHLORIDE 0.9 % IV SOLN: Freq: Once | INTRAVENOUS | Status: AC

## 2015-05-21 MED ORDER — SODIUM CHLORIDE 0.9 % IJ SOLN
INTRAMUSCULAR | Status: AC
  Filled 2015-05-21: qty 10

## 2015-05-21 SURGICAL SUPPLY — 75 items
APL SKNCLS STERI-STRIP NONHPOA (GAUZE/BANDAGES/DRESSINGS) ×4
ATTRACTOMAT 16X20 MAGNETIC DRP (DRAPES) ×2 IMPLANT
BARRIER SKIN 2 1/4 (WOUND CARE) ×3 IMPLANT
BARRIER SKIN OD1.75 2 1/4 FLNG (WOUND CARE) IMPLANT
BENZOIN TINCTURE PRP APPL 2/3 (GAUZE/BANDAGES/DRESSINGS) ×2 IMPLANT
BLADE SURG ROTATE 9660 (MISCELLANEOUS) IMPLANT
BRR ADH 5X3 SEPRAFILM 6 SHT (MISCELLANEOUS)
BRR SKN FLT 1.75X2.25 2 PC (WOUND CARE) ×2
CANISTER SUCTION 2500CC (MISCELLANEOUS) ×3 IMPLANT
CANISTER WOUND CARE 500ML ATS (WOUND CARE) ×1 IMPLANT
CHLORAPREP W/TINT 26ML (MISCELLANEOUS) ×2 IMPLANT
CLEANER TIP ELECTROSURG 2X2 (MISCELLANEOUS) ×3 IMPLANT
COVER MAYO STAND STRL (DRAPES) IMPLANT
COVER SURGICAL LIGHT HANDLE (MISCELLANEOUS) ×3 IMPLANT
DRAIN PENROSE 1/4X12 LTX STRL (WOUND CARE) ×1 IMPLANT
DRAPE LAPAROSCOPIC ABDOMINAL (DRAPES) ×3 IMPLANT
DRAPE PROXIMA HALF (DRAPES) IMPLANT
DRAPE UTILITY XL STRL (DRAPES) ×6 IMPLANT
DRAPE WARM FLUID 44X44 (DRAPE) ×3 IMPLANT
DRSG OPSITE POSTOP 4X10 (GAUZE/BANDAGES/DRESSINGS) IMPLANT
DRSG OPSITE POSTOP 4X8 (GAUZE/BANDAGES/DRESSINGS) IMPLANT
DRSG VAC ATS LRG SENSATRAC (GAUZE/BANDAGES/DRESSINGS) ×1 IMPLANT
ELECT BLADE 6.5 EXT (BLADE) IMPLANT
ELECT CAUTERY BLADE 6.4 (BLADE) ×6 IMPLANT
ELECT REM PT RETURN 9FT ADLT (ELECTROSURGICAL) ×3
ELECTRODE REM PT RTRN 9FT ADLT (ELECTROSURGICAL) ×2 IMPLANT
GAUZE SPONGE 4X4 16PLY XRAY LF (GAUZE/BANDAGES/DRESSINGS) ×3 IMPLANT
GEL ULTRASOUND 20GR AQUASONIC (MISCELLANEOUS) ×3 IMPLANT
GLOVE BIOGEL PI IND STRL 8 (GLOVE) ×2 IMPLANT
GLOVE BIOGEL PI INDICATOR 8 (GLOVE) ×1
GLOVE ECLIPSE 7.5 STRL STRAW (GLOVE) ×3 IMPLANT
GOWN STRL REUS W/ TWL LRG LVL3 (GOWN DISPOSABLE) ×6 IMPLANT
GOWN STRL REUS W/TWL LRG LVL3 (GOWN DISPOSABLE) ×9
H R LUBE JELLY XXX (MISCELLANEOUS) ×1 IMPLANT
HOLDER TRACH TUBE VELCRO 19.5 (MISCELLANEOUS) ×3 IMPLANT
KIT BASIN OR (CUSTOM PROCEDURE TRAY) ×3 IMPLANT
KIT ROOM TURNOVER OR (KITS) ×3 IMPLANT
LIGASURE IMPACT 36 18CM CVD LR (INSTRUMENTS) IMPLANT
NS IRRIG 1000ML POUR BTL (IV SOLUTION) ×7 IMPLANT
PACK EENT II TURBAN DRAPE (CUSTOM PROCEDURE TRAY) ×3 IMPLANT
PACK GENERAL/GYN (CUSTOM PROCEDURE TRAY) ×3 IMPLANT
PAD ARMBOARD 7.5X6 YLW CONV (MISCELLANEOUS) ×6 IMPLANT
PENCIL BUTTON HOLSTER BLD 10FT (ELECTRODE) ×3 IMPLANT
RELOAD PROXIMATE 75MM BLUE (ENDOMECHANICALS) ×3 IMPLANT
RELOAD STAPLE 75 3.8 BLU REG (ENDOMECHANICALS) IMPLANT
SEPRAFILM PROCEDURAL PACK 3X5 (MISCELLANEOUS) IMPLANT
SPECIMEN JAR LARGE (MISCELLANEOUS) IMPLANT
SPONGE ABDOMINAL VAC ABTHERA (MISCELLANEOUS) ×1 IMPLANT
SPONGE DRAIN TRACH 4X4 STRL 2S (GAUZE/BANDAGES/DRESSINGS) ×3 IMPLANT
SPONGE INTESTINAL PEANUT (DISPOSABLE) ×2 IMPLANT
SPONGE LAP 18X18 X RAY DECT (DISPOSABLE) ×1 IMPLANT
STAPLER PROXIMATE 75MM BLUE (STAPLE) ×1 IMPLANT
STAPLER VISISTAT 35W (STAPLE) ×3 IMPLANT
SUCTION POOLE TIP (SUCTIONS) ×3 IMPLANT
SUT ETHILON 2 0 PSLX (SUTURE) ×2 IMPLANT
SUT ETHILON 3 0 FSL (SUTURE) ×3 IMPLANT
SUT NOVA 1 T20/GS 25DT (SUTURE) IMPLANT
SUT PDS AB 1 TP1 96 (SUTURE) ×4 IMPLANT
SUT SILK 2 0 SH (SUTURE) ×3 IMPLANT
SUT SILK 2 0 SH CR/8 (SUTURE) ×4 IMPLANT
SUT SILK 2 0 TIES 10X30 (SUTURE) ×3 IMPLANT
SUT SILK 3 0 SH CR/8 (SUTURE) ×2 IMPLANT
SUT SILK 3 0 TIES 10X30 (SUTURE) ×2 IMPLANT
SUT VIC AB 3-0 SH 18 (SUTURE) ×2 IMPLANT
SUT VICRYL AB 3 0 TIES (SUTURE) ×2 IMPLANT
SYR 20CC LL (SYRINGE) ×2 IMPLANT
TOWEL OR 17X24 6PK STRL BLUE (TOWEL DISPOSABLE) ×3 IMPLANT
TOWEL OR 17X26 10 PK STRL BLUE (TOWEL DISPOSABLE) ×3 IMPLANT
TRAY FOLEY CATH 16FRSI W/METER (SET/KITS/TRAYS/PACK) IMPLANT
TUBE CONNECTING 12X1/4 (SUCTIONS) ×3 IMPLANT
TUBE TRACH SHILEY  6 DIST  CUF (TUBING) IMPLANT
TUBE TRACH SHILEY 10 DIST CUFF (TUBING) IMPLANT
TUBE TRACH SHILEY 8 DIST CUF (TUBING) ×1 IMPLANT
WATER STERILE IRR 1000ML POUR (IV SOLUTION) IMPLANT
YANKAUER SUCT BULB TIP NO VENT (SUCTIONS) ×1 IMPLANT
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
## 2015-05-21 NOTE — Transfer of Care (Signed)
Immediate Anesthesia Transfer of Care Note  Patient: Raymond Bowen  Procedure(s) Performed: Procedure(s): ILEOSTOMY (N/A) TRACHEOSTOMY (N/A) EXPLORATORY LAPAROTOMY (N/A)  Patient Location: ICU  Anesthesia Type:General  Level of Consciousness: sedated, unresponsive and Patient remains intubated per anesthesia plan  Airway & Oxygen Therapy: Patient remains intubated per anesthesia plan and Patient placed on Ventilator (see vital sign flow sheet for setting)  Post-op Assessment: Report given to RN and Post -op Vital signs reviewed and stable  Post vital signs: Reviewed and stable  Last Vitals:  Filed Vitals:   05/21/15 0718  BP:   Pulse:   Temp: 37.4 C  Resp:     Complications: No apparent anesthesia complications
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
## 2015-05-21 NOTE — Progress Notes (Signed)
Patient taken off of ventilator and bagged down to OR without any complications.

## 2015-05-21 NOTE — Op Note (Signed)
OPERATIVE REPORT  DATE OF OPERATION:  05/21/2015  PATIENT:  Raymond Bowen  39 y.o. male  PRE-OPERATIVE DIAGNOSIS:  Open abdomen with bowel in discontinuity, chronic ventilatory support  POST-OPERATIVE DIAGNOSIS:  Open abdomen with bowel in discontinuity, chronic ventilatory support  PROCEDURE:  Procedure(s): SMALL BOWEL RESECTION ILEOSTOMY TRACHEOSTOMY EXPLORATORY LAPAROTOMY NPWD OPEN ABDOMEN  SURGEON:  Surgeon(s): Jimmye NormanJames Jacy Brocker, MD  ASSISTANDerrell Lolling: Ingram, M.D.  ANESTHESIA:   general  EBL: <50 ml  BLOOD ADMINISTERED: One unit of PRBCs  DRAINS: Penrose drain in the colostomy site, Urinary Catheter (Foley), Gastrostomy Tube and New #8 Tracheostomy tube   SPECIMEN:  Source of Specimen:  resected small bowel  COUNTS CORRECT:  YES  PROCEDURE DETAILS: The patient was taken to the operating room and placed on the table in supine position. He to come directly from the intensive care unit with an open abdomen dressing in place, and the purpose of this procedure was to wash out his abdomen, place and ileostomy a possible, and perform a tracheostomy.  A proper timeout was performed identifying the patient and procedure to be performed. Initial negative pressure wound dressing was removed and the operating room. He was subsequently prepped with chlorhexidine because he has an allergy to shellfish.  Once surgeon came into the room the sponge was removed from the right sided previously use ileostomy site. The abdomen was explored manually with no injury to the bowel and no bowel perforations. There were several pockets of turbid fluid but no overt pus and no foul-smelling debris.  The blind end of the small bowel was identified and appeared to be partially ischemic. We resected approximately one foot of the distal ileum and then subsequently brought out an ileostomy through the previous ileostomy site in the right lower quadrant of the abdomen.  Because the skin and the pocket for the  previously placed ileostomy had stretched out to where there is redundant space, the ileostomy was matured in the lateral aspect of the site while the medial aspect was closed using vertical mattress sutures of 2-0 nylon. A 1/4 inch Penrose drain was placed in the ileostomy pocket and sutured in place with a nylon.  We irrigated the peritoneal cavity with a liter saline solution. Closure of the fascia wasn't possible. We did place a negative pressure wound dressing on the open abdomen and secured in place.  We subsequently performed a tracheostomy using a number 8 tracheostomy tube after separately prepping the neck and performing a separate timeout for that procedure.  A transverse incision was made using a #15 blade and we dissected down to the pretracheal fascia with minimal bleeding. A tracheal hook was placed and 2 sutures were placed laterally on the second tracheal ring. An inferior flap tracheotomy was made using a #11 blade. We enlarged the tracheotomy using a hemostat clamp and a tracheal spreader. We subsequently passed the #8 tracheostomy tube into the tracheotomy secured in place with 4 corner stitches of 3-0 nylon after confirming placement with the anesthetic machine showing return of carbon dioxide gas.  Once the tracheostomy tube was in place and secured the patient was made ready to go back to the intensive care unit. All needle counts, sponge counts, and instrument counts were correct.  PATIENT DISPOSITION:  PACU - guarded condition.   Ketty Bitton 10/30/201610:10 AM
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
## 2015-05-21 NOTE — Anesthesia Postprocedure Evaluation (Signed)
  Anesthesia Post-op Note  Patient: Raymond Bowen  Procedure(s) Performed: Procedure(s): ILEOSTOMY (N/A) TRACHEOSTOMY (N/A) EXPLORATORY LAPAROTOMY (N/A)  Patient Location: ICU  Anesthesia Type:General  Level of Consciousness: sedated and unresponsive  Airway and Oxygen Therapy: Patient remains intubated per anesthesia plan  Post-op Pain: sedated, unable to evaluate  Post-op Assessment: Post-op Vital signs reviewed, Patient's Cardiovascular Status Stable and Respiratory Function Stable LLE Motor Response: Purposeful movement, Responds to commands   RLE Motor Response: Purposeful movement, Responds to commands        Post-op Vital Signs: Reviewed and stable  Last Vitals:  Filed Vitals:   05/21/15 0718  BP:   Pulse:   Temp: 37.4 C  Resp:     Complications: No apparent anesthesia complications

## 2015-05-22 ENCOUNTER — Inpatient Hospital Stay (HOSPITAL_COMMUNITY): Payer: No Typology Code available for payment source

## 2015-05-22 ENCOUNTER — Encounter (HOSPITAL_COMMUNITY): Payer: Self-pay | Admitting: General Surgery

## 2015-05-22 LAB — ANAEROBIC CULTURE

## 2015-05-22 LAB — MAGNESIUM: Magnesium: 1.8 mg/dL (ref 1.7–2.4)

## 2015-05-22 LAB — COMPREHENSIVE METABOLIC PANEL
ALT: 34 U/L (ref 17–63)
AST: 52 U/L — ABNORMAL HIGH (ref 15–41)
Albumin: <1 g/dL — ABNORMAL LOW (ref 3.5–5.0)
Alkaline Phosphatase: 50 U/L (ref 38–126)
Anion gap: 4 — ABNORMAL LOW (ref 5–15)
BUN: 17 mg/dL (ref 6–20)
CO2: 28 mmol/L (ref 22–32)
Calcium: 7.6 mg/dL — ABNORMAL LOW (ref 8.9–10.3)
Chloride: 109 mmol/L (ref 101–111)
Creatinine, Ser: 0.7 mg/dL (ref 0.61–1.24)
GFR calc Af Amer: >60 mL/min (ref >60)
GFR calc non Af Amer: >60 mL/min (ref >60)
Glucose, Bld: 172 mg/dL — ABNORMAL HIGH (ref 65–99)
Potassium: 4.2 mmol/L (ref 3.5–5.1)
Sodium: 141 mmol/L (ref 135–145)
Total Bilirubin: 1.7 mg/dL — ABNORMAL HIGH (ref 0.3–1.2)
Total Protein: 4.3 g/dL — ABNORMAL LOW (ref 6.5–8.1)

## 2015-05-22 LAB — POCT I-STAT 7, (LYTES, BLD GAS, ICA,H+H)
Bicarbonate: 25.2 meq/L — ABNORMAL HIGH (ref 20.0–24.0)
Calcium, Ion: 1.17 mmol/L (ref 1.12–1.23)
HCT: 25 % — ABNORMAL LOW (ref 39.0–52.0)
Hemoglobin: 8.5 g/dL — ABNORMAL LOW (ref 13.0–17.0)
O2 Saturation: 94 %
Patient temperature: 38
Potassium: 3.9 mmol/L (ref 3.5–5.1)
Sodium: 140 mmol/L (ref 135–145)
TCO2: 26 mmol/L (ref 0–100)
pCO2 arterial: 43.4 mmHg (ref 35.0–45.0)
pH, Arterial: 7.377 (ref 7.350–7.450)
pO2, Arterial: 78 mmHg — ABNORMAL LOW (ref 80.0–100.0)

## 2015-05-22 LAB — BLOOD GAS, ARTERIAL
Acid-Base Excess: 2.1 mmol/L — ABNORMAL HIGH (ref 0.0–2.0)
Bicarbonate: 26 mEq/L — ABNORMAL HIGH (ref 20.0–24.0)
Drawn by: 39899
FIO2: 0.4
MECHVT: 550 mL
O2 Saturation: 95.3 %
PEEP: 5 cmH2O
Patient temperature: 100.8
RATE: 15 resp/min
TCO2: 27.2 mmol/L (ref 0–100)
pCO2 arterial: 42 mmHg (ref 35.0–45.0)
pH, Arterial: 7.415 (ref 7.350–7.450)
pO2, Arterial: 85.6 mmHg (ref 80.0–100.0)

## 2015-05-22 LAB — CBC
HCT: 23.5 % — ABNORMAL LOW (ref 39.0–52.0)
Hemoglobin: 8 g/dL — ABNORMAL LOW (ref 13.0–17.0)
MCH: 30.1 pg (ref 26.0–34.0)
MCHC: 34 g/dL (ref 30.0–36.0)
MCV: 88.3 fL (ref 78.0–100.0)
Platelets: 445 10*3/uL — ABNORMAL HIGH (ref 150–400)
RBC: 2.66 MIL/uL — ABNORMAL LOW (ref 4.22–5.81)
RDW: 16.2 % — ABNORMAL HIGH (ref 11.5–15.5)
WBC: 33.1 10*3/uL — ABNORMAL HIGH (ref 4.0–10.5)

## 2015-05-22 LAB — TRIGLYCERIDES: Triglycerides: 252 mg/dL — ABNORMAL HIGH (ref <150)

## 2015-05-22 LAB — HEPARIN LEVEL (UNFRACTIONATED): Heparin Unfractionated: 0.65 IU/mL (ref 0.30–0.70)

## 2015-05-22 LAB — PHOSPHORUS: Phosphorus: 3.6 mg/dL (ref 2.5–4.6)

## 2015-05-22 LAB — PREALBUMIN: Prealbumin: 2.7 mg/dL — ABNORMAL LOW (ref 18–38)

## 2015-05-22 MED ORDER — FUROSEMIDE 10 MG/ML IJ SOLN
20.0000 mg | Freq: Once | INTRAMUSCULAR | Status: AC
  Administered 2015-05-22: 20 mg via INTRAVENOUS
  Filled 2015-05-22: qty 2

## 2015-05-22 MED ORDER — TRACE MINERALS CR-CU-MN-SE-ZN 10-1000-500-60 MCG/ML IV SOLN
INTRAVENOUS | Status: DC
  Administered 2015-05-22: 17:00:00 via INTRAVENOUS
  Filled 2015-05-22: qty 3000
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
## 2015-05-22 NOTE — Consult Note (Signed)
WOC ostomy consult note Patient known to this WOC nurse, have been following during admission.  New ileostomy  Stoma type/location: RLQ, ileostomy Stomal assessment/size: pouch intact, just placed in OR yesterday at time of ostomy creation Output bloody Ostomy pouching: 2pc. 2 1/4" in place Education provided: patient sedated, trach. No family in the room. [place]: Yes  Abdominal VAC in place to return to the OR tom for ex lap, VAC change. Possible placement of biological mesh.  WOC will follow along with team for ostomy and wound care support.  Babak Lucus Alpine[place] RN, [place] [phone]

## 2015-05-22 NOTE — Progress Notes (Signed)
I will sign off at this time. Please reconsult when appropriate. [phone]
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
## 2015-05-23 ENCOUNTER — Encounter (HOSPITAL_COMMUNITY): Admission: EM | Disposition: A | Discharge status: Rehabilitation Facility | Payer: Self-pay | Source: Home / Self Care

## 2015-05-23 ENCOUNTER — Inpatient Hospital Stay (HOSPITAL_COMMUNITY): Payer: No Typology Code available for payment source | Admitting: Certified Registered"

## 2015-05-23 HISTORY — PX: VACUUM ASSISTED CLOSURE CHANGE: SHX5227

## 2015-05-23 HISTORY — PX: LAPAROTOMY: SHX154

## 2015-05-23 LAB — CBC
HCT: 22.3 % — ABNORMAL LOW (ref 39.0–52.0)
Hemoglobin: 7.2 g/dL — ABNORMAL LOW (ref 13.0–17.0)
MCH: 28.7 pg (ref 26.0–34.0)
MCHC: 32.3 g/dL (ref 30.0–36.0)
MCV: 88.8 fL (ref 78.0–100.0)
Platelets: 519 10*3/uL — ABNORMAL HIGH (ref 150–400)
RBC: 2.51 MIL/uL — ABNORMAL LOW (ref 4.22–5.81)
RDW: 16 % — ABNORMAL HIGH (ref 11.5–15.5)
WBC: 31.7 10*3/uL — ABNORMAL HIGH (ref 4.0–10.5)

## 2015-05-23 LAB — BLOOD GAS, ARTERIAL
Acid-Base Excess: 3.1 mmol/L — ABNORMAL HIGH (ref 0.0–2.0)
Bicarbonate: 27.3 mEq/L — ABNORMAL HIGH (ref 20.0–24.0)
Drawn by: 418751
FIO2: 0.4
MECHVT: 550 mL
O2 Saturation: 98 %
PEEP: 5 cmH2O
Patient temperature: 100.9
RATE: 16 resp/min
TCO2: 28.6 mmol/L (ref 0–100)
pCO2 arterial: 45.6 mmHg — ABNORMAL HIGH (ref 35.0–45.0)
pH, Arterial: 7.401 (ref 7.350–7.450)
pO2, Arterial: 117 mmHg — ABNORMAL HIGH (ref 80.0–100.0)

## 2015-05-23 LAB — PHOSPHORUS: Phosphorus: 3.8 mg/dL (ref 2.5–4.6)

## 2015-05-23 LAB — HEPARIN LEVEL (UNFRACTIONATED): Heparin Unfractionated: 0.49 IU/mL (ref 0.30–0.70)

## 2015-05-23 LAB — BASIC METABOLIC PANEL
Anion gap: 6 (ref 5–15)
BUN: 18 mg/dL (ref 6–20)
CO2: 28 mmol/L (ref 22–32)
Calcium: 7.3 mg/dL — ABNORMAL LOW (ref 8.9–10.3)
Chloride: 100 mmol/L — ABNORMAL LOW (ref 101–111)
Creatinine, Ser: 0.8 mg/dL (ref 0.61–1.24)
GFR calc Af Amer: >60 mL/min (ref >60)
GFR calc non Af Amer: >60 mL/min (ref >60)
Glucose, Bld: 131 mg/dL — ABNORMAL HIGH (ref 65–99)
Potassium: 4.2 mmol/L (ref 3.5–5.1)
Sodium: 134 mmol/L — ABNORMAL LOW (ref 135–145)

## 2015-05-23 LAB — MAGNESIUM: Magnesium: 1.9 mg/dL (ref 1.7–2.4)

## 2015-05-23 SURGERY — LAPAROTOMY, EXPLORATORY
Anesthesia: General | Site: Abdomen

## 2015-05-23 MED ORDER — LACTATED RINGERS IV SOLN
INTRAVENOUS | Status: DC | PRN
  Administered 2015-05-23: 11:00:00 via INTRAVENOUS

## 2015-05-23 MED ORDER — TRACE MINERALS CR-CU-MN-SE-ZN 10-1000-500-60 MCG/ML IV SOLN
INTRAVENOUS | Status: AC
  Administered 2015-05-23: 18:00:00 via INTRAVENOUS
  Filled 2015-05-23: qty 3000

## 2015-05-23 MED ORDER — FENTANYL CITRATE (PF) 100 MCG/2ML IJ SOLN
INTRAMUSCULAR | Status: DC | PRN
  Administered 2015-05-23: 250 ug via INTRAVENOUS

## 2015-05-23 MED ORDER — MIDAZOLAM HCL 5 MG/5ML IJ SOLN
INTRAMUSCULAR | Status: DC | PRN
  Administered 2015-05-23: 2 mg via INTRAVENOUS

## 2015-05-23 MED ORDER — 0.9 % SODIUM CHLORIDE (POUR BTL) OPTIME
TOPICAL | Status: DC | PRN
  Administered 2015-05-23 (×2): 1000 mL

## 2015-05-23 MED ORDER — FENTANYL CITRATE (PF) 250 MCG/5ML IJ SOLN
INTRAMUSCULAR | Status: AC
  Filled 2015-05-23: qty 5

## 2015-05-23 MED ORDER — MIDAZOLAM HCL 2 MG/2ML IJ SOLN
INTRAMUSCULAR | Status: AC
  Filled 2015-05-23: qty 4

## 2015-05-23 MED ORDER — ROCURONIUM BROMIDE 100 MG/10ML IV SOLN
INTRAVENOUS | Status: DC | PRN
  Administered 2015-05-23 (×2): 50 mg via INTRAVENOUS

## 2015-05-23 SURGICAL SUPPLY — 56 items
APL SKNCLS STERI-STRIP NONHPOA (GAUZE/BANDAGES/DRESSINGS) ×2
BENZOIN TINCTURE PRP APPL 2/3 (GAUZE/BANDAGES/DRESSINGS) ×2 IMPLANT
BLADE SURG ROTATE 9660 (MISCELLANEOUS) IMPLANT
BRR ADH 5X3 SEPRAFILM 6 SHT (MISCELLANEOUS)
CANISTER SUCTION 2500CC (MISCELLANEOUS) ×2 IMPLANT
CANISTER WOUND CARE 500ML ATS (WOUND CARE) ×2 IMPLANT
CHLORAPREP W/TINT 26ML (MISCELLANEOUS) ×1 IMPLANT
COVER MAYO STAND STRL (DRAPES) IMPLANT
COVER SURGICAL LIGHT HANDLE (MISCELLANEOUS) ×2 IMPLANT
DRAPE LAPAROSCOPIC ABDOMINAL (DRAPES) ×2 IMPLANT
DRAPE PROXIMA HALF (DRAPES) IMPLANT
DRAPE UTILITY XL STRL (DRAPES) ×2 IMPLANT
DRAPE WARM FLUID 44X44 (DRAPE) ×1 IMPLANT
DRSG OPSITE POSTOP 4X10 (GAUZE/BANDAGES/DRESSINGS) IMPLANT
DRSG OPSITE POSTOP 4X8 (GAUZE/BANDAGES/DRESSINGS) IMPLANT
DRSG VAC ATS LRG SENSATRAC (GAUZE/BANDAGES/DRESSINGS) IMPLANT
DRSG VERSA FOAM LRG 10X15 (GAUZE/BANDAGES/DRESSINGS) IMPLANT
ELECT BLADE 6.5 EXT (BLADE) IMPLANT
ELECT CAUTERY BLADE 6.4 (BLADE) ×2 IMPLANT
ELECT REM PT RETURN 9FT ADLT (ELECTROSURGICAL) ×2
ELECTRODE REM PT RTRN 9FT ADLT (ELECTROSURGICAL) ×1 IMPLANT
GAUZE SPONGE 4X4 12PLY STRL (GAUZE/BANDAGES/DRESSINGS) ×1 IMPLANT
GLOVE BIO SURGEON STRL SZ7 (GLOVE) ×2 IMPLANT
GLOVE BIOGEL PI IND STRL 7.0 (GLOVE) IMPLANT
GLOVE BIOGEL PI IND STRL 8 (GLOVE) ×1 IMPLANT
GLOVE BIOGEL PI INDICATOR 7.0 (GLOVE) ×2
GLOVE BIOGEL PI INDICATOR 8 (GLOVE) ×1
GLOVE ECLIPSE 7.5 STRL STRAW (GLOVE) ×2 IMPLANT
GOWN STRL REUS W/ TWL LRG LVL3 (GOWN DISPOSABLE) ×3 IMPLANT
GOWN STRL REUS W/TWL LRG LVL3 (GOWN DISPOSABLE) ×6
KIT BASIN OR (CUSTOM PROCEDURE TRAY) ×2 IMPLANT
KIT OSTOMY DRAINABLE 2.75 STR (WOUND CARE) ×1 IMPLANT
KIT ROOM TURNOVER OR (KITS) ×2 IMPLANT
LIGASURE IMPACT 36 18CM CVD LR (INSTRUMENTS) IMPLANT
NS IRRIG 1000ML POUR BTL (IV SOLUTION) ×4 IMPLANT
PACK GENERAL/GYN (CUSTOM PROCEDURE TRAY) ×2 IMPLANT
PAD ARMBOARD 7.5X6 YLW CONV (MISCELLANEOUS) ×3 IMPLANT
PENCIL BUTTON HOLSTER BLD 10FT (ELECTRODE) IMPLANT
SEPRAFILM PROCEDURAL PACK 3X5 (MISCELLANEOUS) IMPLANT
SPECIMEN JAR LARGE (MISCELLANEOUS) IMPLANT
SPONGE ABDOMINAL VAC ABTHERA (MISCELLANEOUS) ×2 IMPLANT
SPONGE LAP 18X18 X RAY DECT (DISPOSABLE) IMPLANT
STAPLER VISISTAT 35W (STAPLE) ×1 IMPLANT
SUCTION POOLE TIP (SUCTIONS) ×2 IMPLANT
SUT NOVA 1 T20/GS 25DT (SUTURE) IMPLANT
SUT PDS AB 1 TP1 96 (SUTURE) ×2 IMPLANT
SUT SILK 2 0 SH CR/8 (SUTURE) ×1 IMPLANT
SUT SILK 2 0 TIES 10X30 (SUTURE) ×1 IMPLANT
SUT SILK 3 0 SH CR/8 (SUTURE) ×1 IMPLANT
SUT SILK 3 0 TIES 10X30 (SUTURE) ×1 IMPLANT
TOWEL OR 17X24 6PK STRL BLUE (TOWEL DISPOSABLE) ×3 IMPLANT
TOWEL OR 17X26 10 PK STRL BLUE (TOWEL DISPOSABLE) ×1 IMPLANT
TRAY FOLEY CATH 16FRSI W/METER (SET/KITS/TRAYS/PACK) IMPLANT
TUBE CONNECTING 12X1/4 (SUCTIONS) IMPLANT
WATER STERILE IRR 1000ML POUR (IV SOLUTION) IMPLANT
YANKAUER SUCT BULB TIP NO VENT (SUCTIONS) ×1 IMPLANT
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
## 2015-05-23 NOTE — Progress Notes (Signed)
Follow up - Trauma and Critical Care  Patient Details:    Raymond Bowen is an 39 y.o. male.  Lines/tubes : PICC / Midline Double Lumen 05/15/15 PICC Right Brachial 45 cm 3 cm (Active)  Indication for Insertion or Continuance of Line Administration of hyperosmolar/irritating solutions (i.e. TPN, Vancomycin, etc.);Vasoactive infusions;Prolonged intravenous therapies 05/22/2015  8:00 PM  Exposed Catheter (cm) 3 cm 05/15/2015 12:00 PM  Site Assessment Clean;Dry;Intact 05/22/2015  8:00 PM  Lumen #1 Status Infusing 05/22/2015  8:00 PM  Lumen #2 Status Infusing 05/22/2015  8:00 PM  Dressing Type Transparent;Occlusive 05/22/2015  8:00 PM  Dressing Status Clean;Dry;Intact;Antimicrobial disc in place 05/22/2015  8:00 PM  Line Care Cap(s) changed;Connections checked and tightened 05/22/2015  8:00 PM  Line Adjustment (NICU/IV Team Only) No 05/19/2015  8:00 AM  Dressing Intervention New dressing;Dressing changed;Antimicrobial disc changed 05/22/2015 12:00 PM  Dressing Change Due 05/29/15 05/22/2015 12:00 PM     CVC Double Lumen 05/16/15 Right Internal jugular 20 cm 0 cm (Active)  Indication for Insertion or Continuance of Line Vasoactive infusions;Prolonged intravenous therapies;Administration of hyperosmolar/irritating solutions (i.e. TPN, Vancomycin, etc.) 05/22/2015  8:00 PM  Site Assessment Clean;Dry;Intact 05/22/2015  8:00 PM  Proximal Lumen Status Infusing 05/22/2015  8:00 PM  Distal Lumen Status Infusing 05/22/2015  8:00 PM  Dressing Type Transparent;Occlusive 05/22/2015  8:00 PM  Dressing Status Clean;Dry;Intact 05/22/2015  8:00 PM  Line Care Connections checked and tightened;Zeroed and calibrated;Leveled 05/22/2015  8:00 PM  Dressing Intervention New dressing;Antimicrobial disc changed 05/18/2015  6:00 PM  Dressing Change Due 05/25/15 05/22/2015  8:00 AM     Arterial Line 05/16/15 Right Radial (Active)  Site Assessment Clean;Dry;Intact 05/22/2015  8:00 PM  Line Status Pulsatile blood  flow;Positional 05/22/2015  8:00 PM  Art Line Waveform Appropriate 05/22/2015  8:00 PM  Art Line Interventions Zeroed and calibrated;Leveled;Connections checked and tightened;Flushed per protocol 05/22/2015  8:00 PM  Color/Movement/Sensation Capillary refill less than 3 sec 05/22/2015  8:00 PM  Dressing Type Transparent;Occlusive 05/22/2015  8:00 PM  Dressing Status Clean;Dry;Intact 05/22/2015  8:00 PM     Open Drain 1 Right Abdomen (Active)  Site Description Other (Comment) 05/22/2015  8:00 PM  Dressing Status Clean;Dry 05/22/2015  8:00 PM  Drainage Appearance Serous 05/22/2015  3:00 PM  Status Unclamped 05/22/2015  8:00 PM     Negative Pressure Wound Therapy Abdomen Left;Upper (Active)  Last dressing change 05/21/15 05/22/2015  8:00 PM  Site / Wound Assessment Dressing in place / Unable to assess 05/22/2015  8:00 PM  Peri-wound Assessment Intact 05/22/2015  8:00 PM  Wound filler - White foam 0 05/22/2015  8:00 AM  Wound filler - Nonadherent 0 05/22/2015  8:00 AM  Wound filler - Gauze 0 05/22/2015  8:00 AM  Cycle Continuous;On 05/22/2015  8:00 PM  Target Pressure (mmHg) 125 05/22/2015  8:00 PM  Canister Changed Yes 05/22/2015  8:00 PM  Dressing Status Intact 05/22/2015  8:00 PM  Drainage Amount Minimal 05/22/2015  8:00 PM  Drainage Description Serosanguineous 05/22/2015  8:00 PM  Output (mL) 400 mL 05/23/2015  6:00 AM     Gastrostomy/Enterostomy Gastrostomy 24 Fr. LUQ (Active)  Surrounding Skin Dry;Intact 05/22/2015  8:00 PM  Tube Status Suction-low intermittent 05/22/2015  8:00 PM  Drainage Appearance Green 05/22/2015  8:00 PM  Dressing Status Dry;Intact;Old drainage 05/22/2015  8:00 PM  Dressing Intervention Dressing reinforced 05/22/2015  8:00 PM  Dressing Type Split gauze;Other (Comment) 05/22/2015  8:00 PM  Output (mL) 300 mL 05/23/2015  6:00 AM  Ileostomy RLQ (Active)  Ostomy Pouch 2 piece 05/22/2015  8:00 PM  Stoma Assessment Red 05/22/2015  8:00 PM  Peristomal  Assessment Intact 05/22/2015  8:00 PM  Output (mL) 30 mL 05/23/2015  6:00 AM     Urethral Catheter [name], RN Latex 16 Fr. (Active)  Indication for Insertion or Continuance of Catheter Peri-operative use for selective surgical procedure 05/22/2015  7:21 PM  Site Assessment Clean;Intact;Dry 05/22/2015  7:21 PM  Catheter Maintenance Bag below level of bladder;Catheter secured;Drainage bag/tubing not touching floor;Insertion date on drainage bag;No dependent loops;Seal intact 05/22/2015  7:21 PM  Collection Container Standard drainage bag 05/22/2015  7:21 PM  Securement Method Securing device (Describe) 05/22/2015  7:21 PM  Urinary Catheter Interventions Unclamped 05/22/2015  8:00 AM  Output (mL) 190 mL 05/23/2015  8:00 AM    Microbiology/Sepsis markers: Results for orders placed or performed during the hospital encounter of 05/07/15  MRSA PCR Screening     Status: None   Collection Time: 05/07/15 11:43 PM  Result Value Ref Range Status   MRSA by PCR NEGATIVE NEGATIVE Final    Comment:        The GeneXpert MRSA Assay (FDA approved for NASAL specimens only), is one component of a comprehensive MRSA colonization surveillance program. It is not intended to diagnose MRSA infection nor to guide or monitor treatment for MRSA infections.   Culture, Urine     Status: None   Collection Time: 05/14/15 11:22 AM  Result Value Ref Range Status   Specimen Description URINE, CATHETERIZED  Final   Special Requests NONE  Final   Culture NO GROWTH 1 DAY  Final   Report Status 05/15/2015 FINAL  Final  Culture, expectorated sputum-assessment     Status: None   Collection Time: 05/15/15  8:50 AM  Result Value Ref Range Status   Specimen Description SPUTUM  Final   Special Requests Normal  Final   Sputum evaluation   Final    THIS SPECIMEN IS ACCEPTABLE. RESPIRATORY CULTURE REPORT TO FOLLOW.   Report Status 05/15/2015 FINAL  Final  Culture, respiratory (NON-Expectorated)     Status: None    Collection Time: 05/15/15  8:50 AM  Result Value Ref Range Status   Specimen Description SPUTUM  Final   Special Requests NONE  Final   Gram Stain   Final    ABUNDANT WBC PRESENT, PREDOMINANTLY PMN RARE SQUAMOUS EPITHELIAL CELLS PRESENT MODERATE GRAM NEGATIVE RODS FEW GRAM POSITIVE COCCI IN PAIRS    Culture   Final    MODERATE PSEUDOMONAS AERUGINOSA MODERATE HAEMOPHILUS INFLUENZAE Note: BETA LACTAMASE NEGATIVE Performed at [facility]    Report Status 05/18/2015 FINAL  Final   Organism ID, Bacteria PSEUDOMONAS AERUGINOSA  Final      Susceptibility   Pseudomonas aeruginosa - MIC*    CEFEPIME 2 SENSITIVE Sensitive     CEFTAZIDIME 4 SENSITIVE Sensitive     CIPROFLOXACIN <=0.25 SENSITIVE Sensitive     GENTAMICIN <=1 SENSITIVE Sensitive     IMIPENEM 1 SENSITIVE Sensitive     PIP/TAZO 8 SENSITIVE Sensitive     TOBRAMYCIN <=1 SENSITIVE Sensitive     * MODERATE PSEUDOMONAS AERUGINOSA  Culture, respiratory (NON-Expectorated)     Status: None   Collection Time: 05/17/15 12:25 PM  Result Value Ref Range Status   Specimen Description TRACHEAL ASPIRATE  Final   Special Requests Normal  Final   Gram Stain   Final    RARE WBC PRESENT, PREDOMINANTLY MONONUCLEAR RARE SQUAMOUS EPITHELIAL CELLS PRESENT NO  ORGANISMS SEEN Performed at [facility]    Culture   Final    FEW PSEUDOMONAS AERUGINOSA Performed at [facility]    Report Status 05/20/2015 FINAL  Final   Organism ID, Bacteria PSEUDOMONAS AERUGINOSA  Final      Susceptibility   Pseudomonas aeruginosa - MIC*    CEFEPIME 2 SENSITIVE Sensitive     CEFTAZIDIME 4 SENSITIVE Sensitive     CIPROFLOXACIN <=0.25 SENSITIVE Sensitive     GENTAMICIN <=1 SENSITIVE Sensitive     IMIPENEM 1 SENSITIVE Sensitive     PIP/TAZO 8 SENSITIVE Sensitive     TOBRAMYCIN <=1 SENSITIVE Sensitive     * FEW PSEUDOMONAS AERUGINOSA  Anaerobic culture     Status: None   Collection Time: 05/18/15 12:30 PM  Result Value Ref Range  Status   Specimen Description FLUID PERITONEAL  Final   Special Requests PT ON ERLAPENEM,MEROPENEM,ZOSYN  Final   Gram Stain   Final    FEW WBC PRESENT, PREDOMINANTLY PMN MODERATE GRAM POSITIVE RODS FEW GRAM POSITIVE COCCI IN PAIRS RARE GRAM NEGATIVE RODS    Culture   Final    MIXED ANAEROBIC FLORA PRESENT.  CALL LAB IF FURTHER IID REQUIRED.   Report Status 05/22/2015 FINAL  Final  Body fluid culture     Status: None   Collection Time: 05/18/15 12:30 PM  Result Value Ref Range Status   Specimen Description FLUID PERITONEAL  Final   Special Requests PT ON ERLAPENEM,MEROPENEM,ZOSYN  Final   Gram Stain   Final    FEW WBC PRESENT, PREDOMINANTLY PMN MODERATE GRAM POSITIVE RODS FEW GRAM POSITIVE COCCI IN PAIRS RARE GRAM NEGATIVE RODS    Culture   Final    RARE ESCHERICHIA COLI FEW ENTEROCOCCUS SPECIES RARE PSEUDOMONAS AERUGINOSA    Report Status 05/21/2015 FINAL  Final   Organism ID, Bacteria ESCHERICHIA COLI  Final   Organism ID, Bacteria ENTEROCOCCUS SPECIES  Final   Organism ID, Bacteria PSEUDOMONAS AERUGINOSA  Final      Susceptibility   Escherichia coli - MIC*    AMPICILLIN <=2 SENSITIVE Sensitive     CEFAZOLIN <=4 SENSITIVE Sensitive     CEFEPIME <=1 SENSITIVE Sensitive     CEFTAZIDIME <=1 SENSITIVE Sensitive     CEFTRIAXONE <=1 SENSITIVE Sensitive     CIPROFLOXACIN <=0.25 SENSITIVE Sensitive     GENTAMICIN <=1 SENSITIVE Sensitive     IMIPENEM <=0.25 SENSITIVE Sensitive     TRIMETH/SULFA <=20 SENSITIVE Sensitive     AMPICILLIN/SULBACTAM <=2 SENSITIVE Sensitive     PIP/TAZO <=4 SENSITIVE Sensitive     * RARE ESCHERICHIA COLI   Pseudomonas aeruginosa - MIC*    CEFTAZIDIME 4 SENSITIVE Sensitive     CIPROFLOXACIN <=0.25 SENSITIVE Sensitive     GENTAMICIN <=1 SENSITIVE Sensitive     IMIPENEM 1 SENSITIVE Sensitive     PIP/TAZO 8 SENSITIVE Sensitive     CEFEPIME 4 SENSITIVE Sensitive     * RARE PSEUDOMONAS AERUGINOSA   Enterococcus species - MIC*    AMPICILLIN <=2  SENSITIVE Sensitive     VANCOMYCIN <=0.5 SENSITIVE Sensitive     GENTAMICIN SYNERGY SENSITIVE Sensitive     * FEW ENTEROCOCCUS SPECIES    Anti-infectives:  Anti-infectives    Start     Dose/Rate Route Frequency Ordered Stop   05/20/15 1330  piperacillin-tazobactam (ZOSYN) IVPB 3.375 g     3.375 g 12.5 mL/hr over 240 Minutes Intravenous Every 8 hours 05/20/15 1321     05/17/15 1000  ertapenem (  INVANZ) 1 g in sodium chloride 0.9 % 50 mL IVPB  Status:  Discontinued     1 g 100 mL/hr over 30 Minutes Intravenous Every 24 hours 05/17/15 0936 05/17/15 0951   05/17/15 1000  meropenem (MERREM) 1 g in sodium chloride 0.9 % 100 mL IVPB  Status:  Discontinued     1 g 200 mL/hr over 30 Minutes Intravenous 3 times per day 05/17/15 0951 05/20/15 1321   05/15/15 0815  piperacillin-tazobactam (ZOSYN) IVPB 3.375 g  Status:  Discontinued     3.375 g 12.5 mL/hr over 240 Minutes Intravenous 3 times per day 05/15/15 0804 05/17/15 0951   05/08/15 0200  ceFAZolin (ANCEF) IVPB 2 g/50 mL premix     2 g 100 mL/hr over 30 Minutes Intravenous 3 times per day 05/08/15 0140 05/09/15 2238   05/08/15 0130  ceFAZolin (ANCEF) powder 2 g  Status:  Discontinued     2 g Other Every 8 hours 05/08/15 0129 05/08/15 0139      Best Practice/Protocols:  VTE Prophylaxis: Heparin (drip) and (therapeutic for left DVT) GI Prophylaxis: Proton Pump Inhibitor Continous Sedation  Consults: Treatment Team:  Nada Libman, MD    Events:  Subjective:    Overnight Issues: Patient has had fvers, and it looks like it may be coming from his abdominal wound.  Objective:  Vital signs for last 24 hours: Temp:  [98.8 F (37.1 C)-102.6 F (39.2 C)] 102.2 F (39 C) (11/01 0718) Pulse Rate:  [72-85] 83 (11/01 0800) Resp:  [14-24] 21 (11/01 0800) BP: (95-112)/(5-63) 106/52 mmHg (11/01 0800) SpO2:  [88 %-100 %] 94 % (11/01 0800) Arterial Line BP: (95-152)/(43-58) 104/46 mmHg (11/01 0800) FiO2 (%):  [40 %] 40 % (11/01  0809)  Hemodynamic parameters for last 24 hours: CVP:  [9 mmHg-15 mmHg] 9 mmHg  Intake/Output from previous day: 10/31 0701 - 11/01 0700 In: 5726.4 [I.V.:1326.4; IV Piggyback:150; TPN:4250] Out: 3830 [Urine:2550; Drains:1200; Stool:80]  Intake/Output this shift: Total I/O In: 156.1 [I.V.:31.1; TPN:125] Out: 190 [Urine:190]  Vent settings for last 24 hours: Vent Mode:  [-] PRVC FiO2 (%):  [40 %] 40 % Set Rate:  [16 bmp] 16 bmp Vt Set:  [550 mL] 550 mL PEEP:  [5 cmH20] 5 cmH20 Plateau Pressure:  [21 cmH20-25 cmH20] 24 cmH20  Physical Exam:  Neuro: alert and nonfocal exam Resp: clear to auscultation bilaterally GI: Open abdomen, bilious drainage from the sides of the NPWD wound Extremities: no edema, no erythema, pulses WNL, edema 4+, pulses doppler,  and unequal size  Results for orders placed or performed during the hospital encounter of 05/07/15 (from the past 24 hour(s))  Prealbumin     Status: Abnormal   Collection Time: 05/22/15  9:20 AM  Result Value Ref Range   Prealbumin 2.7 (L) 18 - 38 mg/dL  Blood gas, arterial     Status: Abnormal   Collection Time: 05/23/15  4:05 AM  Result Value Ref Range   FIO2 0.40    Delivery systems VENTILATOR    Mode PRESSURE REGULATED VOLUME CONTROL    VT 550 mL   LHR 16 resp/min   Peep/cpap 5.0 cm H20   pH, Arterial 7.401 7.350 - 7.450   pCO2 arterial 45.6 (H) 35.0 - 45.0 mmHg   pO2, Arterial 117 (H) 80.0 - 100.0 mmHg   Bicarbonate 27.3 (H) 20.0 - 24.0 mEq/L   TCO2 28.6 0 - 100 mmol/L   Acid-Base Excess 3.1 (H) 0.0 - 2.0 mmol/L   O2 Saturation  98.0 %   Patient temperature 100.9    Collection site A-LINE    Drawn by [id number]    Sample type ARTERIAL DRAW   CBC     Status: Abnormal   Collection Time: 05/23/15  5:48 AM  Result Value Ref Range   WBC 31.7 (H) 4.0 - 10.5 K/uL   RBC 2.51 (L) 4.22 - 5.81 MIL/uL   Hemoglobin 7.2 (L) 13.0 - 17.0 g/dL   HCT 04.5 (L) 40.9 - 81.1 %   MCV 88.8 78.0 - 100.0 fL   MCH 28.7 26.0 - 34.0 pg    MCHC 32.3 30.0 - 36.0 g/dL   RDW 91.4 (H) 78.2 - 95.6 %   Platelets 519 (H) 150 - 400 K/uL  Basic metabolic panel     Status: Abnormal   Collection Time: 05/23/15  5:48 AM  Result Value Ref Range   Sodium 134 (L) 135 - 145 mmol/L   Potassium 4.2 3.5 - 5.1 mmol/L   Chloride 100 (L) 101 - 111 mmol/L   CO2 28 22 - 32 mmol/L   Glucose, Bld 131 (H) 65 - 99 mg/dL   BUN 18 6 - 20 mg/dL   Creatinine, Ser 2.13 0.61 - 1.24 mg/dL   Calcium 7.3 (L) 8.9 - 10.3 mg/dL   GFR calc non Af Amer >60 >60 mL/min   GFR calc Af Amer >60 >60 mL/min   Anion gap 6 5 - 15  Magnesium     Status: None   Collection Time: 05/23/15  5:48 AM  Result Value Ref Range   Magnesium 1.9 1.7 - 2.4 mg/dL  Phosphorus     Status: None   Collection Time: 05/23/15  5:48 AM  Result Value Ref Range   Phosphorus 3.8 2.5 - 4.6 mg/dL  Heparin level (unfractionated)     Status: None   Collection Time: 05/23/15  5:49 AM  Result Value Ref Range   Heparin Unfractionated 0.49 0.30 - 0.70 IU/mL     Assessment/Plan:   NEURO  Altered Mental Status:  sedation and occasional agitation   Plan: Keep sedated.  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   Plan: CPM  GI  Bowel Trauma with Open abdomen.   Plan: CPM  ID  Pneumonia (hospital acquired (not ventilator-associated) unknown antibiotic) open abdomen   Plan: Back to the OR today.  HEME  Anemia acute blood loss anemia)   Plan: No blood for now.  ENDO No specific issues.   Plan: CPM  Global Issues  Back to the OR for washout and maybe identify the source of the current fever.      LOS: 16 days   Additional comments:I reviewed the patient's new clinical lab test results. cbc/bmet and I reviewed the patients new imaging test results. cxr  Critical Care Total Time*: 30  Minutes  Siyah Mault 05/23/2015  *Care during the described time interval was provided by me and/or other providers on the critical care team.  I have reviewed this patient's available data, including medical history, events of note, physical examination and test results as part of my evaluation.

## 2015-05-23 NOTE — Transfer of Care (Signed)
Immediate Anesthesia Transfer of Care Note  Patient: Elmon ElseBrian XXXCurrie  Procedure(s) Performed: Procedure(s): EXPLORATORY LAPAROTOMY FOR OPEN ABDOMEN (N/A) ABDOMINAL VACUUM ASSISTED CLOSURE CHANGE (N/A)  Patient Location: NICU  Anesthesia Type:General  Level of Consciousness: sedated and Patient remains intubated per anesthesia plan  Airway & Oxygen Therapy: Patient remains intubated per anesthesia plan and Patient placed on Ventilator (see vital sign flow sheet for setting)  Post-op Assessment: Report given to RN and Post -op Vital signs reviewed and stable  Post vital signs: Reviewed and stable  Last Vitals:  Filed Vitals:   05/23/15 1000  BP: 105/49  Pulse: 74  Temp:   Resp: 17    Complications: No apparent anesthesia complications

## 2015-05-23 NOTE — Anesthesia Procedure Notes (Signed)
Date/Time: 05/23/2015 11:18 AM Performed by: Wray KearnsFOLEY, Petina Muraski A Pre-anesthesia Checklist: Patient identified, Timeout performed, Emergency Drugs available, Suction available and Patient being monitored Patient Re-evaluated:Patient Re-evaluated prior to inductionOxygen Delivery Method: Circle system utilized Preoxygenation: Pre-oxygenation with 100% oxygen Intubation Type: Combination inhalational/ intravenous induction and Tracheostomy Placement Confirmation: breath sounds checked- equal and bilateral and positive ETCO2

## 2015-05-23 NOTE — Progress Notes (Signed)
RT note- Patient was taken to the OR at 1104 and remains off the unit at this time.
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
## 2015-05-23 NOTE — Op Note (Signed)
OPERATIVE REPORT  DATE OF OPERATION:  05/23/2015  PATIENT:  Raymond Bowen  39 y.o. male  PRE-OPERATIVE DIAGNOSIS:  OPEN ABDOMEN  POST-OPERATIVE DIAGNOSIS:  OPEN ABDOMEN/PARTIALLY NECROTIC ILEOSTOMY  PROCEDURE:  Procedure(s): EXPLORATORY LAPAROTOMY FOR OPEN ABDOMEN ABDOMINAL VACUUM ASSISTED CLOSURE CHANGE  SURGEON:  Surgeon(s): Jimmye NormanJames Lindsey Hommel, MD  ASSISTANT: None  ANESTHESIA:   general  EBL: <30 ml  BLOOD ADMINISTERED: none  DRAINS: Urinary Catheter (Foley) and Gastrostomy Tube   SPECIMEN:  No Specimen  COUNTS CORRECT:  YES  PROCEDURE DETAILS: The patient was taken to the OR and placed on the table in the supine position.  He was intubated from the ICU, then prepped and draped in the usual manner after the previous NPWT dressing was removed.  He was prepped with chlorhexidine.  The entire accessible peritoneal cavity was irrigated with sterile saline.  There were no pocket ofpurulent drainage.  All of the intraperitoneal contents appeared to be viable although still significantly edematous.  The ostomy site had been uncovered for the procedure.  At its subfascial connection to the abdominal wall, the small bowel appeared to viable; however, at the skin level and above it appeared to be at least partially necrotic.  The decision was made to delay revision at least until the next procedure on Thursday when we will begin the slow process towards closure.  The NPWT dressing was replaced and an adequate seal applied.  All counts were correct.  PATIENT DISPOSITION:  ICU - intubated and hemodynamically stable.   Raymond Bowen 11/1/20165:32 PM
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
## 2015-05-24 ENCOUNTER — Encounter (HOSPITAL_COMMUNITY): Payer: Self-pay | Admitting: General Surgery

## 2015-05-24 ENCOUNTER — Inpatient Hospital Stay (HOSPITAL_COMMUNITY): Payer: No Typology Code available for payment source

## 2015-05-24 LAB — CBC WITH DIFFERENTIAL/PLATELET
Basophils Absolute: 0.3 10*3/uL — ABNORMAL HIGH (ref 0.0–0.1)
Basophils Relative: 1 %
Eosinophils Absolute: 0.3 10*3/uL (ref 0.0–0.7)
Eosinophils Relative: 1 %
HCT: 21 % — ABNORMAL LOW (ref 39.0–52.0)
Hemoglobin: 6.8 g/dL — CL (ref 13.0–17.0)
Lymphocytes Relative: 12 %
Lymphs Abs: 3.9 10*3/uL (ref 0.7–4.0)
MCH: 28.7 pg (ref 26.0–34.0)
MCHC: 32.4 g/dL (ref 30.0–36.0)
MCV: 88.6 fL (ref 78.0–100.0)
Monocytes Absolute: 3.6 10*3/uL — ABNORMAL HIGH (ref 0.1–1.0)
Monocytes Relative: 11 %
Neutro Abs: 24.6 10*3/uL — ABNORMAL HIGH (ref 1.7–7.7)
Neutrophils Relative %: 75 %
Platelets: 548 10*3/uL — ABNORMAL HIGH (ref 150–400)
RBC: 2.37 MIL/uL — ABNORMAL LOW (ref 4.22–5.81)
RDW: 16.3 % — ABNORMAL HIGH (ref 11.5–15.5)
WBC: 32.7 10*3/uL — ABNORMAL HIGH (ref 4.0–10.5)

## 2015-05-24 LAB — BASIC METABOLIC PANEL
Anion gap: 8 (ref 5–15)
BUN: 16 mg/dL (ref 6–20)
CO2: 30 mmol/L (ref 22–32)
Calcium: 7.6 mg/dL — ABNORMAL LOW (ref 8.9–10.3)
Chloride: 101 mmol/L (ref 101–111)
Creatinine, Ser: 0.72 mg/dL (ref 0.61–1.24)
GFR calc Af Amer: >60 mL/min (ref >60)
GFR calc non Af Amer: >60 mL/min (ref >60)
Glucose, Bld: 128 mg/dL — ABNORMAL HIGH (ref 65–99)
Potassium: 4.1 mmol/L (ref 3.5–5.1)
Sodium: 139 mmol/L (ref 135–145)

## 2015-05-24 LAB — PREPARE RBC (CROSSMATCH)

## 2015-05-24 LAB — TRIGLYCERIDES: Triglycerides: 245 mg/dL — ABNORMAL HIGH (ref <150)

## 2015-05-24 LAB — HEPARIN LEVEL (UNFRACTIONATED): Heparin Unfractionated: 0.63 IU/mL (ref 0.30–0.70)

## 2015-05-24 LAB — MAGNESIUM: Magnesium: 1.9 mg/dL (ref 1.7–2.4)

## 2015-05-24 LAB — PHOSPHORUS: Phosphorus: 4 mg/dL (ref 2.5–4.6)

## 2015-05-24 MED ORDER — TRACE MINERALS CR-CU-MN-SE-ZN 10-1000-500-60 MCG/ML IV SOLN
INTRAVENOUS | Status: DC
  Administered 2015-05-24: 18:00:00 via INTRAVENOUS
  Filled 2015-05-24: qty 3000

## 2015-05-24 MED ORDER — SODIUM CHLORIDE 0.9 % IV SOLN
Freq: Once | INTRAVENOUS | Status: AC
  Administered 2015-05-24: 11:00:00 via INTRAVENOUS

## 2015-05-24 MED ORDER — FUROSEMIDE 10 MG/ML IJ SOLN
20.0000 mg | Freq: Once | INTRAMUSCULAR | Status: AC
  Administered 2015-05-24: 20 mg via INTRAVENOUS
  Filled 2015-05-24: qty 2

## 2015-05-24 MED ORDER — FAT EMULSION 20 % IV EMUL
240.0000 mL | INTRAVENOUS | Status: DC
  Administered 2015-05-24: 240 mL via INTRAVENOUS
  Administered 2015-05-25: 10 mL via INTRAVENOUS
  Filled 2015-05-24: qty 250
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
## 2015-05-24 NOTE — Care Management Note (Signed)
Case Management Note  Patient Details  Name: Raymond Bowen MRN: [id number] Date of Birth: [birth year]  Subjective/Objective:   Pt remains on ventilator with open abdomen on TNA; receiving blood today for Hgb 6.8.  He continues Heparin drip for DVTs.                   Action/Plan: Planning return to OR for further abdominal surgery.  Will cont to follow progress.    Expected Discharge Date:              Expected Discharge Plan:  IP Rehab Facility  In-House Referral:  Clinical Social Work  Discharge planning Services  CM Consult  Post Acute Care Choice:    Choice offered to:     DME Arranged:    DME Agency:     HH Arranged:    HH Agency:     Status of Service:  In process, will continue to follow  Medicare Important Message Given:    Date Medicare IM Given:    Medicare IM give by:    Date Additional Medicare IM Given:    Additional Medicare Important Message give by:     If discussed at Long Length of Stay Meetings, dates discussed:    Additional Comments:  Quintella BatonJulie W. Fifi Schindler, RN, BSN  Trauma/Neuro ICU Case Manager [phone]

## 2015-05-24 NOTE — Progress Notes (Signed)
CRITICAL VALUE ALERT  Critical value received: HGB=6.8 Date of notification:  05/24/15 Time of notification:  0623hrs Critical value read back:yes  Nurse who received alert: Loraine LericheMark MD notified (1st page):  [phone]       Responding MD: Dr Magnus IvanBlackman Time MD responded: [phone]
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
## 2015-05-24 NOTE — Progress Notes (Signed)
PARENTERAL NUTRITION CONSULT NOTE  Pharmacy Consult for TPN Indication: prolonged ileus  Allergies  Allergen Reactions  . Shellfish Allergy Anaphylaxis    Patient Measurements: Height: 5\' 10"  (177.8 cm) Weight: 228 lb 9.9 oz (103.7 kg) IBW/kg (Calculated) : 73  Vital Signs: Temp: 97.8 F (36.6 C) (11/02 0840) Temp Source: Axillary (11/02 0840) BP: 100/51 mmHg (11/02 0852) Pulse Rate: 68 (11/02 0852) Intake/Output from previous day: 11/01 0701 - 11/02 0700 In: 4589.8 [I.V.:1437.7; IV Piggyback:150; TPN:3002.1] Out: 4130 [Urine:2770; Drains:1250; Stool:110] Intake/Output from this shift: Total I/O In: 30 [Blood:30] Out: 200 [Urine:200]  Labs:  Recent Labs  05/22/15 0529 05/23/15 0548 05/24/15 0524  WBC 33.1* 31.7* 32.7*  HGB 8.0* 7.2* 6.8*  HCT 23.5* 22.3* 21.0*  PLT 445* 519* 548*     Recent Labs  05/22/15 0529 05/22/15 0920 05/23/15 0548 05/24/15 0524  NA 141  --  134* 139  K 4.2  --  4.2 4.1  CL 109  --  100* 101  CO2 28  --  28 30  GLUCOSE 172*  --  131* 128*  BUN 17  --  18 16  CREATININE 0.70  --  0.80 0.72  CALCIUM 7.6*  --  7.3* 7.6*  MG 1.8  --  1.9 1.9  PHOS 3.6  --  3.8 4.0  PROT 4.3*  --   --   --   ALBUMIN <1.0*  --   --   --   AST 52*  --   --   --   ALT 34  --   --   --   ALKPHOS 50  --   --   --   BILITOT 1.7*  --   --   --   PREALBUMIN  --  2.7*  --   --   TRIG 252*  --   --  245*   Estimated Creatinine Clearance: 152.5 mL/min (by C-G formula based on Cr of 0.72).   Insulin Requirements in the past 24 hours:  None- SSI d/c'd  Nutritional Goals: per RD 05/23/15 2218 kCal, 150-170 grams of protein per day  Cur24BahKPromise Hospital Of San DiegYevetteMarland KitchenSamson Freder41.[facility] OutpatientGames devVictorino68mDikerCalvert Cantorty Hospital - Panama CityBahCCovenant Specialty HospOutpatient Eye Sur l: SCr 0.72-stable. CrCl >100mL/min. UOP 1.1mL/kg/hr. very edematous, overloaded- getting Lasix  Lytes: K 4.1, phos 4, mag 1.9- all WNL. CorCa likely ~10.3 (hard to correct as albumin is <1)  Hepatic:  AST 52, ALT nml, alb <1, TBili still elevated at 1.7 (down from 3.8)-watch T bili - will remove trace elements if > 6 or decrease to half if becomes jaundiced TPN IVFE stopped 10/26 for  Trig 1074, now off of propofol and back down, however trigs are increasing again even with IVFE being given once weekly- checked this morning and trigs are 245  ID:  HCAP/UTI/ Nec fasc. Zosyn for enterococcus coverage. WBC remains elevated at 31.7, tmax/24h 102.6  Zosyn 10/24>>10/26, 10/29>> Meropenem 10/25>>10/29  10/27: Peritoneal fluid: Ecoli (pan S), Enterococcus (S amp), rare Pseudomonas 10/27: Peritoneal fluid ANaerobic: GPR, GPC, GNR 10/24 Sputum - pseudomonas, H.Flu 10/23 Urine - NEG  Neuro: Fent @ 400 mcg/hr, Precedex @ 1.23mcg/kg/hr, Versed @ /hr  Cards: off pressors. BP still on the softer side, HR ok- no meds  Pulm: Pt still remarkably edematous and rhonci lung sounds per RN. Vented with 40% FiO2  Anticoagulation: Heparin infusion for DVT. Hgb 7.2, plts 519- no bleeding noted. To be stopped 11/3 at 0500 for OR  TPN started: 10/24>> Access:  PICC placed for TPN  10/24  Plan:  -Continue Clinimix E 5/15 at 125 ml/hr with IVFE 20% on Wednesdays only- provides 150g protein daily and an average of 2201 kcal/day- -continue full trace elements and IV multivitamin in TPN -full TPN labs tomorrow as per protocol -follow OR plans, clinical progression, any changes to nutritional goals per RD  Makih Stefanko D. Pearlee Arvizu, PharmD, BCPS Clinical Pharmacist Pager: [phone] 05/24/2015 9:14 AM

## 2015-05-25 ENCOUNTER — Inpatient Hospital Stay (HOSPITAL_COMMUNITY): Payer: No Typology Code available for payment source

## 2015-05-25 ENCOUNTER — Encounter (HOSPITAL_COMMUNITY): Admission: EM | Disposition: A | Discharge status: Rehabilitation Facility | Payer: Self-pay | Source: Home / Self Care

## 2015-05-25 ENCOUNTER — Inpatient Hospital Stay (HOSPITAL_COMMUNITY): Payer: No Typology Code available for payment source | Admitting: Certified Registered Nurse Anesthetist

## 2015-05-25 ENCOUNTER — Encounter (HOSPITAL_COMMUNITY): Payer: Self-pay | Admitting: Certified Registered Nurse Anesthetist

## 2015-05-25 HISTORY — PX: APPLICATION OF WOUND VAC: SHX5189

## 2015-05-25 HISTORY — PX: LAPAROTOMY: SHX154

## 2015-05-25 LAB — TYPE AND SCREEN
ABO/RH(D): O POS
Antibody Screen: NEGATIVE
Unit division: 0
Unit division: 0
Unit division: 0

## 2015-05-25 LAB — BLOOD GAS, ARTERIAL
Acid-Base Excess: 3.5 mmol/L — ABNORMAL HIGH (ref 0.0–2.0)
Bicarbonate: 27.7 mEq/L — ABNORMAL HIGH (ref 20.0–24.0)
Drawn by: 364961
FIO2: 0.4
MECHVT: 550 mL
O2 Saturation: 96.5 %
PEEP: 5 cmH2O
Patient temperature: 103
RATE: 16 resp/min
TCO2: 29 mmol/L (ref 0–100)
pCO2 arterial: 48.6 mmHg — ABNORMAL HIGH (ref 35.0–45.0)
pH, Arterial: 7.387 (ref 7.350–7.450)
pO2, Arterial: 100 mmHg (ref 80.0–100.0)

## 2015-05-25 LAB — URINALYSIS, ROUTINE W REFLEX MICROSCOPIC
Glucose, UA: NEGATIVE mg/dL
Hgb urine dipstick: NEGATIVE
Ketones, ur: NEGATIVE mg/dL
Leukocytes, UA: NEGATIVE
Nitrite: NEGATIVE
Protein, ur: NEGATIVE mg/dL
Specific Gravity, Urine: 1.015 (ref 1.005–1.030)
Urobilinogen, UA: 2 mg/dL — ABNORMAL HIGH (ref 0.0–1.0)
pH: 5.5 (ref 5.0–8.0)

## 2015-05-25 LAB — COMPREHENSIVE METABOLIC PANEL
ALT: 53 U/L (ref 17–63)
AST: 70 U/L — ABNORMAL HIGH (ref 15–41)
Albumin: 1.1 g/dL — ABNORMAL LOW (ref 3.5–5.0)
Alkaline Phosphatase: 119 U/L (ref 38–126)
Anion gap: 6 (ref 5–15)
BUN: 19 mg/dL (ref 6–20)
CO2: 30 mmol/L (ref 22–32)
Calcium: 7.8 mg/dL — ABNORMAL LOW (ref 8.9–10.3)
Chloride: 102 mmol/L (ref 101–111)
Creatinine, Ser: 0.75 mg/dL (ref 0.61–1.24)
GFR calc Af Amer: >60 mL/min (ref >60)
GFR calc non Af Amer: >60 mL/min (ref >60)
Glucose, Bld: 137 mg/dL — ABNORMAL HIGH (ref 65–99)
Potassium: 5 mmol/L (ref 3.5–5.1)
Sodium: 138 mmol/L (ref 135–145)
Total Bilirubin: 1.4 mg/dL — ABNORMAL HIGH (ref 0.3–1.2)
Total Protein: 5.5 g/dL — ABNORMAL LOW (ref 6.5–8.1)

## 2015-05-25 LAB — CBC
HCT: 29.8 % — ABNORMAL LOW (ref 39.0–52.0)
Hemoglobin: 10 g/dL — ABNORMAL LOW (ref 13.0–17.0)
MCH: 30.1 pg (ref 26.0–34.0)
MCHC: 33.6 g/dL (ref 30.0–36.0)
MCV: 89.8 fL (ref 78.0–100.0)
Platelets: 597 10*3/uL — ABNORMAL HIGH (ref 150–400)
RBC: 3.32 MIL/uL — ABNORMAL LOW (ref 4.22–5.81)
RDW: 15.6 % — ABNORMAL HIGH (ref 11.5–15.5)
WBC: 35.2 10*3/uL — ABNORMAL HIGH (ref 4.0–10.5)

## 2015-05-25 LAB — TRIGLYCERIDES: Triglycerides: 227 mg/dL — ABNORMAL HIGH (ref <150)

## 2015-05-25 LAB — HEPARIN LEVEL (UNFRACTIONATED): Heparin Unfractionated: 0.36 IU/mL (ref 0.30–0.70)

## 2015-05-25 LAB — MAGNESIUM: Magnesium: 1.8 mg/dL (ref 1.7–2.4)

## 2015-05-25 LAB — PHOSPHORUS: Phosphorus: 4 mg/dL (ref 2.5–4.6)

## 2015-05-25 SURGERY — LAPAROTOMY, EXPLORATORY
Anesthesia: General | Site: Abdomen

## 2015-05-25 MED ORDER — DEXMEDETOMIDINE HCL IN NACL 200 MCG/50ML IV SOLN
INTRAVENOUS | Status: AC
  Filled 2015-05-25: qty 50

## 2015-05-25 MED ORDER — ROCURONIUM BROMIDE 100 MG/10ML IV SOLN
INTRAVENOUS | Status: DC | PRN
  Administered 2015-05-25 (×4): 50 mg via INTRAVENOUS

## 2015-05-25 MED ORDER — 0.9 % SODIUM CHLORIDE (POUR BTL) OPTIME
TOPICAL | Status: DC | PRN
  Administered 2015-05-25: 4000 mL

## 2015-05-25 MED ORDER — SUCCINYLCHOLINE CHLORIDE 20 MG/ML IJ SOLN
INTRAMUSCULAR | Status: AC
  Filled 2015-05-25: qty 2

## 2015-05-25 MED ORDER — PROPOFOL 10 MG/ML IV BOLUS
INTRAVENOUS | Status: DC | PRN
  Administered 2015-05-25: 50 mg via INTRAVENOUS

## 2015-05-25 MED ORDER — PHENYLEPHRINE 40 MCG/ML (10ML) SYRINGE FOR IV PUSH (FOR BLOOD PRESSURE SUPPORT)
PREFILLED_SYRINGE | INTRAVENOUS | Status: AC
  Filled 2015-05-25: qty 30

## 2015-05-25 MED ORDER — ROCURONIUM BROMIDE 50 MG/5ML IV SOLN
INTRAVENOUS | Status: AC
Start: 2015-05-25 — End: 2015-05-25
  Filled 2015-05-25: qty 2

## 2015-05-25 MED ORDER — ONDANSETRON HCL 4 MG/2ML IJ SOLN
INTRAMUSCULAR | Status: AC
  Filled 2015-05-25: qty 4

## 2015-05-25 MED ORDER — PROPOFOL 10 MG/ML IV BOLUS
INTRAVENOUS | Status: AC
  Filled 2015-05-25: qty 20

## 2015-05-25 MED ORDER — ATROPINE SULFATE 0.1 MG/ML IJ SOLN
INTRAMUSCULAR | Status: AC
  Filled 2015-05-25: qty 10

## 2015-05-25 MED ORDER — TRACE MINERALS CR-CU-MN-SE-ZN 10-1000-500-60 MCG/ML IV SOLN
INTRAVENOUS | Status: AC
  Administered 2015-05-25: 19:00:00 via INTRAVENOUS
  Administered 2015-05-25: 125 mL/h via INTRAVENOUS
  Filled 2015-05-25: qty 3000

## 2015-05-25 MED ORDER — ROCURONIUM BROMIDE 50 MG/5ML IV SOLN
INTRAVENOUS | Status: AC
  Filled 2015-05-25: qty 4

## 2015-05-25 MED ORDER — FENTANYL CITRATE (PF) 250 MCG/5ML IJ SOLN
INTRAMUSCULAR | Status: AC
  Filled 2015-05-25: qty 5

## 2015-05-25 MED ORDER — LIDOCAINE HCL (CARDIAC) 20 MG/ML IV SOLN
INTRAVENOUS | Status: AC
  Filled 2015-05-25: qty 15

## 2015-05-25 MED ORDER — LACTATED RINGERS IV SOLN
INTRAVENOUS | Status: DC | PRN
  Administered 2015-05-25 (×2): via INTRAVENOUS

## 2015-05-25 MED ORDER — MIDAZOLAM HCL 2 MG/2ML IJ SOLN
INTRAMUSCULAR | Status: AC
  Filled 2015-05-25: qty 4

## 2015-05-25 SURGICAL SUPPLY — 63 items
APL SKNCLS STERI-STRIP NONHPOA (GAUZE/BANDAGES/DRESSINGS) ×4
BENZOIN TINCTURE PRP APPL 2/3 (GAUZE/BANDAGES/DRESSINGS) ×2 IMPLANT
BLADE 11 SAFETY STRL DISP (BLADE) ×1 IMPLANT
BLADE SURG ROTATE 9660 (MISCELLANEOUS) IMPLANT
BRR ADH 5X3 SEPRAFILM 6 SHT (MISCELLANEOUS)
CANISTER SUCTION 2500CC (MISCELLANEOUS) ×3 IMPLANT
CANISTER WOUND CARE 500ML ATS (WOUND CARE) ×4 IMPLANT
CHLORAPREP W/TINT 26ML (MISCELLANEOUS) ×2 IMPLANT
COVER MAYO STAND STRL (DRAPES) IMPLANT
COVER SURGICAL LIGHT HANDLE (MISCELLANEOUS) ×3 IMPLANT
DRAIN CHANNEL 19F RND (DRAIN) ×1 IMPLANT
DRAPE INCISE IOBAN 66X45 STRL (DRAPES) ×2 IMPLANT
DRAPE LAPAROSCOPIC ABDOMINAL (DRAPES) ×3 IMPLANT
DRAPE PROXIMA HALF (DRAPES) IMPLANT
DRAPE UTILITY XL STRL (DRAPES) ×6 IMPLANT
DRAPE WARM FLUID 44X44 (DRAPE) ×3 IMPLANT
DRSG OPSITE POSTOP 4X10 (GAUZE/BANDAGES/DRESSINGS) IMPLANT
DRSG OPSITE POSTOP 4X8 (GAUZE/BANDAGES/DRESSINGS) IMPLANT
DRSG VAC ATS LRG SENSATRAC (GAUZE/BANDAGES/DRESSINGS) ×1 IMPLANT
DRSG VAC ATS MED SENSATRAC (GAUZE/BANDAGES/DRESSINGS) ×1 IMPLANT
DRSG VERSA FOAM LRG 10X15 (GAUZE/BANDAGES/DRESSINGS) IMPLANT
ELECT BLADE 6.5 EXT (BLADE) IMPLANT
ELECT CAUTERY BLADE 6.4 (BLADE) ×5 IMPLANT
ELECT REM PT RETURN 9FT ADLT (ELECTROSURGICAL) ×3
ELECTRODE REM PT RTRN 9FT ADLT (ELECTROSURGICAL) ×2 IMPLANT
EVACUATOR SILICONE 100CC (DRAIN) ×1 IMPLANT
GLOVE BIOGEL PI IND STRL 7.0 (GLOVE) IMPLANT
GLOVE BIOGEL PI IND STRL 8 (GLOVE) ×2 IMPLANT
GLOVE BIOGEL PI INDICATOR 7.0 (GLOVE) ×2
GLOVE BIOGEL PI INDICATOR 8 (GLOVE) ×2
GLOVE ECLIPSE 7.5 STRL STRAW (GLOVE) ×4 IMPLANT
GOWN STRL REUS W/ TWL LRG LVL3 (GOWN DISPOSABLE) ×6 IMPLANT
GOWN STRL REUS W/TWL LRG LVL3 (GOWN DISPOSABLE) ×12
KIT BASIN OR (CUSTOM PROCEDURE TRAY) ×3 IMPLANT
KIT OSTOMY DRAINABLE 2.75 STR (WOUND CARE) ×1 IMPLANT
KIT ROOM TURNOVER OR (KITS) ×3 IMPLANT
LIGASURE IMPACT 36 18CM CVD LR (INSTRUMENTS) IMPLANT
MARKER SKIN DUAL TIP RULER LAB (MISCELLANEOUS) ×2 IMPLANT
NS IRRIG 1000ML POUR BTL (IV SOLUTION) ×8 IMPLANT
PACK GENERAL/GYN (CUSTOM PROCEDURE TRAY) ×3 IMPLANT
PAD ARMBOARD 7.5X6 YLW CONV (MISCELLANEOUS) ×3 IMPLANT
PENCIL BUTTON HOLSTER BLD 10FT (ELECTRODE) IMPLANT
SEPRAFILM PROCEDURAL PACK 3X5 (MISCELLANEOUS) IMPLANT
SET ABDOMINAL WALL CLOSURE (MISCELLANEOUS) ×1 IMPLANT
SET ABDOMINAL WALL CLS EXT 30 (MISCELLANEOUS) ×1 IMPLANT
SPECIMEN JAR LARGE (MISCELLANEOUS) IMPLANT
SPONGE ABDOMINAL VAC ABTHERA (MISCELLANEOUS) IMPLANT
SPONGE LAP 18X18 X RAY DECT (DISPOSABLE) ×2 IMPLANT
STAPLER VISISTAT 35W (STAPLE) ×2 IMPLANT
SUCTION POOLE TIP (SUCTIONS) ×3 IMPLANT
SUT ETHILON 3 0 FSL (SUTURE) ×1 IMPLANT
SUT NOVA 1 T20/GS 25DT (SUTURE) IMPLANT
SUT PDS AB 1 TP1 96 (SUTURE) ×4 IMPLANT
SUT SILK 2 0 SH CR/8 (SUTURE) ×2 IMPLANT
SUT SILK 2 0 TIES 10X30 (SUTURE) ×2 IMPLANT
SUT SILK 3 0 SH CR/8 (SUTURE) ×2 IMPLANT
SUT SILK 3 0 TIES 10X30 (SUTURE) ×2 IMPLANT
TOWEL OR 17X24 6PK STRL BLUE (TOWEL DISPOSABLE) ×3 IMPLANT
TOWEL OR 17X26 10 PK STRL BLUE (TOWEL DISPOSABLE) ×4 IMPLANT
TRAY FOLEY CATH 16FRSI W/METER (SET/KITS/TRAYS/PACK) IMPLANT
TUBE CONNECTING 12X1/4 (SUCTIONS) IMPLANT
WATER STERILE IRR 1000ML POUR (IV SOLUTION) IMPLANT
YANKAUER SUCT BULB TIP NO VENT (SUCTIONS) IMPLANT

## 2015-05-25 NOTE — Anesthesia Postprocedure Evaluation (Signed)
  Anesthesia Post-op Note  Patient: Raymond Bowen  Procedure(s) Performed: Procedure(s): EXPLORATORY LAPAROTOMY AND WOUND REVISION (N/A) APPLICATION OF WOUND VAC (N/A)  Patient Location: SICU  Anesthesia Type:General  Level of Consciousness: sedated, unresponsive and Patient remains intubated per anesthesia plan  Airway and Oxygen Therapy: Patient remains intubated per anesthesia plan and Patient placed on Ventilator (see vital sign flow sheet for setting)  Post-op Pain: none  Post-op Assessment: Post-op Vital signs reviewed and Patient's Cardiovascular Status Stable LLE Motor Response: Non-purposeful movement   RLE Motor Response: Non-purposeful movement        Post-op Vital Signs: stable  Last Vitals:  Filed Vitals:   05/25/15 1901  BP: 120/78  Pulse: 82  Temp:   Resp: 25    Complications: No apparent anesthesia complications

## 2015-05-25 NOTE — Progress Notes (Signed)
Pt noted to have rectal temperature of 103.3 after an initial temperature of 101.9, which was treated with tylenol, ice packs, and cooling of the room. Upon assessment of stomach, drainage noted coming from old ileostomy site in the lower right quadrant. The gauze dressing covering old ileostomy site is saturated with serous drainage which is leaking out from ileostomy site.  Formed substance, that appears to be stool on the outside of the dressing stopping short of the abdominal incision. On call trauma notified of these findings, new orders given and carried out. Will continue to monitor. Herma ArdMesser, Vickye Astorino RN BSN

## 2015-05-25 NOTE — Op Note (Addendum)
OPERATIVE REPORT  DATE OF OPERATION:  05/25/2015  PATIENT:  Raymond Bowen  39 y.o. male  PRE-OPERATIVE DIAGNOSIS:  Open abdomen with partially necrotic ileostomy  POST-OPERATIVE DIAGNOSIS:  Open abdomen, dislodge gastrostomy tube, viable ileostomy  PROCEDURE:  Procedure(s): REMOVAL OF PREVIOUS OPEN ABDOMINAL DRESSING WITH ATTEMPT TO CLOSE AND WASHOUT EXPLORATORY LAPAROTOMY AND WOUND REVISION, PLACEMENT OF BRIDGING ELASTIC TENSION GENERATING WOUND CLOSURE DEVICE (ABRA) APPLICATION OF WOUND VAC Myocutaneous release maneuver  SURGEON:  Surgeon(s): Jimmye Norman, MD Gaynelle Adu, MD  ASSISTANT: Andrey Campanile, M.D.  ANESTHESIA:   general  EBL: <50 ml  BLOOD ADMINISTERED: none  DRAINS: Nasogastric Tube, Urinary Catheter (Foley) and (One) Blake drain(s) in the area around the stomach   SPECIMEN:  No Specimen  COUNTS CORRECT:  YES  PROCEDURE DETAILS: The patient was taken to the operating room and placed on table in supine position. He came directly to the operating room from the intensive care unit with a negative pressure wound dressing in place. A proper timeout was performed identifying the patient and procedure to be performed. The negative pressure wound dressing was removed along with the stomal device around the right-sided ileostomy. The Penrose drain which was in the corner of the ileostomy site was removed also. Those no drainage from the wound.  Once we start exploring the abdomen the was noted immediately that the patient is Mallinckrodt gastrostomy tube had been pulled out nose a small amount of bilious fluid in the right upper quadrant. There was no active drainage of bilious fluid from the stomach the actual images of the tube into the stomach cannot be seen. Was decided that to drain this area with a Blake drain would be adequate after placement of the new abdominal wound closure device.  Attempt to get back into the gastrotomy site were not made. We irrigated the abdomen with  copious amounts saline solution and there was no evidence of pockets of pus and no drainage. There was no inadvertent enterotomies.  There was a pocket internally that extended up into the subcutaneous space surrounding the ileostomy.  There drainage from that area was light pink in color and not purulent.  The Penrose drain which had been in place was removed externally because it would have interfered with the new closure system.  The wound was measured for lenth and width from the acutal fascial margin, not the skin margin.  This was found to be 23 cm wide by 34 cm long.  Once the previous negative pressure would dressing had been removed, the skin markings for the placement of the new ABRA closure system were measured and placed.  The ileostomy device had to be removed for this also.  A new ostomy base was cut and tapered to decrease the amount of appliance around the stoma.  Once created, the skin was marked along the margins of the new ostomy appliance base.  We marked the skin bilaterally five centimeters from the margin of the retracted fascia, not the skin edge.  1.75 cm from the superior and inferior margins of the ostomy appliance base a mark was placed at the 5.0 centimeter line paralleling the skin edge.  Intermittent markings were then made precisely 3.0 centimeters from the 1.75 cm marks for the entire length of the incision on the ostomy side.  On the side opposite the ileostomy evenly spaced 3.0 cm markings were placed to coincide with the similar markings on the ostomy side at the 5.0 cm line.  These were numbered on each side.  Very small incisions just into the subcutaneous tissue were then made at the 3.0 cm marks on the line using a #11 blade bilaterally.  Once these were made, a completely sheet of Ioban was passed across the entire abdomen.  The Ioban was then nicked at each 3.0 cm site over the previous small incisions.  The central part of the Ioban was then cut out.  Under direct  vision with a hard rubber bowel protective device in place, a sharp tipped elastic string retriever was passed through each small incision site on the side opposite the ostomy, and used to pull the long, previously marked, elastic band through the abdominal wall and fascia.  This was done on the ostomy side also, doubling up some of the bands through the holes just above and below the ileostomy.  A large, bowel protecting fenestrated, silicone sheetwas cut to the appropriate size and to contour around the ileostomy.  It was then passed into the peritoneum under the crossing bands and the fascia.  It was flattened out circumferentially.  An additional silicone tube with evenly spaced deep slits in it was placed in the center of the abdomen on top of the silicone sheet.  The elastic bands were then secured in place with skin securing "buttons" .  The bands were stretched to the appropriate length and passed into the slits of the silicone tube evenly spaced from top to bottom.  Once this was done, myocutaneous separation bilaterally was performed by the surgeon and assistant by providing bilateral upwoard and inward fluctuating pressure, starting at the most lateral aspect of the sterile field.  Once this maneuver was completed, a large black sponge, and part of a medium sponge, were used to fill in the space in the center of the wound.  This was secured in place with the clear adhesive plastic supplied.  Care was taken not to cover the buttons with the plastic.  The buttons were anchored to the skin laterally using hook straps that attached laterally.  At the end of the procedure the width of the fascia closing had come down from 23 cm to 17 cm.  All needle counts, sponge counts and instrument counts were correct.    PATIENT DISPOSITION:  with tracheostomy and on the ventilator.  To Trauma ICU   Raymond Bowen 11/3/20166:39 PM
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
## 2015-05-25 NOTE — Progress Notes (Signed)
Follow up - Trauma and Critical Care  Patient Details:    Raymond Bowen is an 39 y.o. male.  Lines/tubes : PICC / Midline Double Lumen 05/15/15 PICC Right Brachial 45 cm 3 cm (Active)  Indication for Insertion or Continuance of Line Administration of hyperosmolar/irritating solutions (i.e. TPN, Vancomycin, etc.);Vasoactive infusions;Prolonged intravenous therapies 05/24/2015  8:00 PM  Exposed Catheter (cm) 3 cm 05/15/2015 12:00 PM  Site Assessment Clean;Dry;Intact 05/24/2015  8:00 PM  Lumen #1 Status Infusing 05/24/2015  8:00 PM  Lumen #2 Status Infusing 05/24/2015  8:00 PM  Dressing Type Transparent 05/24/2015  8:00 PM  Dressing Status Clean;Dry;Intact;Antimicrobial disc in place 05/24/2015  8:00 PM  Line Care Connections checked and tightened;Zeroed and calibrated;Leveled 05/24/2015  8:00 PM  Line Adjustment (NICU/IV Team Only) No 05/19/2015  8:00 AM  Dressing Intervention New dressing;Dressing changed;Antimicrobial disc changed 05/22/2015 12:00 PM  Dressing Change Due 05/29/15 05/24/2015  8:00 AM     CVC Double Lumen 05/16/15 Right Internal jugular 20 cm 0 cm (Active)  Indication for Insertion or Continuance of Line Vasoactive infusions;Prolonged intravenous therapies;Administration of hyperosmolar/irritating solutions (i.e. TPN, Vancomycin, etc.) 05/24/2015  8:00 PM  Site Assessment Clean;Dry;Intact 05/24/2015  8:00 PM  Proximal Lumen Status Infusing 05/24/2015  8:00 PM  Distal Lumen Status Infusing 05/24/2015  8:00 PM  Dressing Type Transparent 05/24/2015  8:00 PM  Dressing Status Clean;Dry;Intact 05/24/2015  8:00 PM  Line Care Connections checked and tightened;Zeroed and calibrated;Leveled 05/24/2015  8:00 PM  Dressing Intervention Antimicrobial disc changed;Dressing changed 05/23/2015  5:28 PM  Dressing Change Due 05/30/15 05/24/2015  8:00 PM     Open Drain 1 Right Abdomen (Active)  Site Description Unremarkable 05/24/2015  8:00 PM  Dressing Status Clean;Dry;Intact 05/24/2015  8:00 PM   Drainage Appearance Serosanguineous 05/24/2015  8:00 PM  Status Unclamped 05/24/2015  8:00 PM     Negative Pressure Wound Therapy Abdomen (Active)  Last dressing change 05/23/15 05/24/2015  8:00 AM  Site / Wound Assessment Dressing in place / Unable to assess 05/24/2015  8:00 PM  Peri-wound Assessment Intact 05/24/2015  8:00 PM  Cycle Continuous;On 05/24/2015  8:00 PM  Target Pressure (mmHg) 125 05/24/2015  8:00 PM  Canister Changed Yes 05/24/2015  8:00 PM  Dressing Status Intact 05/24/2015  8:00 PM  Drainage Amount Moderate 05/24/2015  8:00 PM  Drainage Description Serosanguineous 05/24/2015  8:00 PM  Output (mL) 350 mL 05/25/2015  6:00 AM     Gastrostomy/Enterostomy Gastrostomy 24 Fr. LUQ (Active)  Surrounding Skin Dry;Intact 05/24/2015  8:00 PM  Tube Status Suction-low intermittent 05/24/2015  8:00 PM  Drainage Appearance Green 05/24/2015  8:00 PM  Dressing Status Clean;Dry;Intact 05/24/2015  8:00 PM  Dressing Intervention New dressing 05/24/2015  8:00 PM  Dressing Type Split gauze 05/24/2015  8:00 PM  Output (mL) 200 mL 05/25/2015  6:00 AM     Ileostomy RLQ (Active)  Ostomy Pouch 2 piece 05/24/2015  8:00 PM  Stoma Assessment Red 05/24/2015  8:00 PM  Peristomal Assessment Intact 05/24/2015  8:00 PM  Output (mL) 50 mL 05/25/2015  6:00 AM     Urethral Catheter [name], RN Latex 16 Fr. (Active)  Indication for Insertion or Continuance of Catheter Peri-operative use for selective surgical procedure 05/24/2015  8:00 PM  Site Assessment Clean;Intact;Dry 05/24/2015  8:00 PM  Catheter Maintenance Bag below level of bladder;Drainage bag/tubing not touching floor;Catheter secured;Insertion date on drainage bag;No dependent loops;Seal intact;Bag emptied prior to transport 05/24/2015  8:00 PM  Collection Container Standard drainage bag 05/24/2015  8:00  PM  Securement Method Securing device (Describe) 05/24/2015  8:00 PM  Urinary Catheter Interventions Unclamped 05/24/2015  8:00 PM  Output (mL) 150 mL 05/25/2015  6:00  AM    Microbiology/Sepsis markers: Results for orders placed or performed during the hospital encounter of 05/07/15  MRSA PCR Screening     Status: None   Collection Time: 05/07/15 11:43 PM  Result Value Ref Range Status   MRSA by PCR NEGATIVE NEGATIVE Final    Comment:        The GeneXpert MRSA Assay (FDA approved for NASAL specimens only), is one component of a comprehensive MRSA colonization surveillance program. It is not intended to diagnose MRSA infection nor to guide or monitor treatment for MRSA infections.   Culture, Urine     Status: None   Collection Time: 05/14/15 11:22 AM  Result Value Ref Range Status   Specimen Description URINE, CATHETERIZED  Final   Special Requests NONE  Final   Culture NO GROWTH 1 DAY  Final   Report Status 05/15/2015 FINAL  Final  Culture, expectorated sputum-assessment     Status: None   Collection Time: 05/15/15  8:50 AM  Result Value Ref Range Status   Specimen Description SPUTUM  Final   Special Requests Normal  Final   Sputum evaluation   Final    THIS SPECIMEN IS ACCEPTABLE. RESPIRATORY CULTURE REPORT TO FOLLOW.   Report Status 05/15/2015 FINAL  Final  Culture, respiratory (NON-Expectorated)     Status: None   Collection Time: 05/15/15  8:50 AM  Result Value Ref Range Status   Specimen Description SPUTUM  Final   Special Requests NONE  Final   Gram Stain   Final    ABUNDANT WBC PRESENT, PREDOMINANTLY PMN RARE SQUAMOUS EPITHELIAL CELLS PRESENT MODERATE GRAM NEGATIVE RODS FEW GRAM POSITIVE COCCI IN PAIRS    Culture   Final    MODERATE PSEUDOMONAS AERUGINOSA MODERATE HAEMOPHILUS INFLUENZAE Note: BETA LACTAMASE NEGATIVE Performed at [facility]    Report Status 05/18/2015 FINAL  Final   Organism ID, Bacteria PSEUDOMONAS AERUGINOSA  Final      Susceptibility   Pseudomonas aeruginosa - MIC*    CEFEPIME 2 SENSITIVE Sensitive     CEFTAZIDIME 4 SENSITIVE Sensitive     CIPROFLOXACIN <=0.25 SENSITIVE Sensitive      GENTAMICIN <=1 SENSITIVE Sensitive     IMIPENEM 1 SENSITIVE Sensitive     PIP/TAZO 8 SENSITIVE Sensitive     TOBRAMYCIN <=1 SENSITIVE Sensitive     * MODERATE PSEUDOMONAS AERUGINOSA  Culture, respiratory (NON-Expectorated)     Status: None   Collection Time: 05/17/15 12:25 PM  Result Value Ref Range Status   Specimen Description TRACHEAL ASPIRATE  Final   Special Requests Normal  Final   Gram Stain   Final    RARE WBC PRESENT, PREDOMINANTLY MONONUCLEAR RARE SQUAMOUS EPITHELIAL CELLS PRESENT NO ORGANISMS SEEN Performed at [facility]    Culture   Final    FEW PSEUDOMONAS AERUGINOSA Performed at [facility]    Report Status 05/20/2015 FINAL  Final   Organism ID, Bacteria PSEUDOMONAS AERUGINOSA  Final      Susceptibility   Pseudomonas aeruginosa - MIC*    CEFEPIME 2 SENSITIVE Sensitive     CEFTAZIDIME 4 SENSITIVE Sensitive     CIPROFLOXACIN <=0.25 SENSITIVE Sensitive     GENTAMICIN <=1 SENSITIVE Sensitive     IMIPENEM 1 SENSITIVE Sensitive     PIP/TAZO 8 SENSITIVE Sensitive     TOBRAMYCIN <=1  SENSITIVE Sensitive     * FEW PSEUDOMONAS AERUGINOSA  Anaerobic culture     Status: None   Collection Time: 05/18/15 12:30 PM  Result Value Ref Range Status   Specimen Description FLUID PERITONEAL  Final   Special Requests PT ON ERLAPENEM,MEROPENEM,ZOSYN  Final   Gram Stain   Final    FEW WBC PRESENT, PREDOMINANTLY PMN MODERATE GRAM POSITIVE RODS FEW GRAM POSITIVE COCCI IN PAIRS RARE GRAM NEGATIVE RODS    Culture   Final    MIXED ANAEROBIC FLORA PRESENT.  CALL LAB IF FURTHER IID REQUIRED.   Report Status 05/22/2015 FINAL  Final  Body fluid culture     Status: None   Collection Time: 05/18/15 12:30 PM  Result Value Ref Range Status   Specimen Description FLUID PERITONEAL  Final   Special Requests PT ON ERLAPENEM,MEROPENEM,ZOSYN  Final   Gram Stain   Final    FEW WBC PRESENT, PREDOMINANTLY PMN MODERATE GRAM POSITIVE RODS FEW GRAM POSITIVE COCCI IN PAIRS RARE  GRAM NEGATIVE RODS    Culture   Final    RARE ESCHERICHIA COLI FEW ENTEROCOCCUS SPECIES RARE PSEUDOMONAS AERUGINOSA    Report Status 05/21/2015 FINAL  Final   Organism ID, Bacteria ESCHERICHIA COLI  Final   Organism ID, Bacteria ENTEROCOCCUS SPECIES  Final   Organism ID, Bacteria PSEUDOMONAS AERUGINOSA  Final      Susceptibility   Escherichia coli - MIC*    AMPICILLIN <=2 SENSITIVE Sensitive     CEFAZOLIN <=4 SENSITIVE Sensitive     CEFEPIME <=1 SENSITIVE Sensitive     CEFTAZIDIME <=1 SENSITIVE Sensitive     CEFTRIAXONE <=1 SENSITIVE Sensitive     CIPROFLOXACIN <=0.25 SENSITIVE Sensitive     GENTAMICIN <=1 SENSITIVE Sensitive     IMIPENEM <=0.25 SENSITIVE Sensitive     TRIMETH/SULFA <=20 SENSITIVE Sensitive     AMPICILLIN/SULBACTAM <=2 SENSITIVE Sensitive     PIP/TAZO <=4 SENSITIVE Sensitive     * RARE ESCHERICHIA COLI   Pseudomonas aeruginosa - MIC*    CEFTAZIDIME 4 SENSITIVE Sensitive     CIPROFLOXACIN <=0.25 SENSITIVE Sensitive     GENTAMICIN <=1 SENSITIVE Sensitive     IMIPENEM 1 SENSITIVE Sensitive     PIP/TAZO 8 SENSITIVE Sensitive     CEFEPIME 4 SENSITIVE Sensitive     * RARE PSEUDOMONAS AERUGINOSA   Enterococcus species - MIC*    AMPICILLIN <=2 SENSITIVE Sensitive     VANCOMYCIN <=0.5 SENSITIVE Sensitive     GENTAMICIN SYNERGY SENSITIVE Sensitive     * FEW ENTEROCOCCUS SPECIES    Anti-infectives:  Anti-infectives    Start     Dose/Rate Route Frequency Ordered Stop   05/20/15 1330  piperacillin-tazobactam (ZOSYN) IVPB 3.375 g     3.375 g 12.5 mL/hr over 240 Minutes Intravenous Every 8 hours 05/20/15 1321     05/17/15 1000  ertapenem (INVANZ) 1 g in sodium chloride 0.9 % 50 mL IVPB  Status:  Discontinued     1 g 100 mL/hr over 30 Minutes Intravenous Every 24 hours 05/17/15 0936 05/17/15 0951   05/17/15 1000  meropenem (MERREM) 1 g in sodium chloride 0.9 % 100 mL IVPB  Status:  Discontinued     1 g 200 mL/hr over 30 Minutes Intravenous 3 times per day 05/17/15  0951 05/20/15 1321   05/15/15 0815  piperacillin-tazobactam (ZOSYN) IVPB 3.375 g  Status:  Discontinued     3.375 g 12.5 mL/hr over 240 Minutes Intravenous 3 times per day 05/15/15 0804 05/17/15  1610   05/08/15 0200  ceFAZolin (ANCEF) IVPB 2 g/50 mL premix     2 g 100 mL/hr over 30 Minutes Intravenous 3 times per day 05/08/15 0140 05/09/15 2238   05/08/15 0130  ceFAZolin (ANCEF) powder 2 g  Status:  Discontinued     2 g Other Every 8 hours 05/08/15 0129 05/08/15 0139      Best Practice/Protocols:  VTE Prophylaxis: Heparin (drip) and (for treatment of DVT) GI Prophylaxis: Proton Pump Inhibitor Continous Sedation  Consults: Treatment Team:  Nada Libman, MD    Events:  Subjective:    Overnight Issues: Patient recently spiked a fever up to 103.3.  Cultures of the blood and urine recently are negative.  Has some drainage around the ostomy site, but the stoma seems to be viable.  Will examine the pocket at surgery.  Objective:  Vital signs for last 24 hours: Temp:  [97.8 F (36.6 C)-103.3 F (39.6 C)] 101 F (38.3 C) (11/03 0716) Pulse Rate:  [44-93] 80 (11/03 0700) Resp:  [0-24] 15 (11/03 0700) BP: (100-135)/(50-74) 118/55 mmHg (11/03 0700) SpO2:  [97 %-100 %] 98 % (11/03 0700) FiO2 (%):  [40 %] 40 % (11/03 0600)  Hemodynamic parameters for last 24 hours: CVP:  [11 mmHg-17 mmHg] 12 mmHg  Intake/Output from previous day: 11/02 0701 - 11/03 0700 In: 6374.6 [I.V.:2416.3; Blood:776.7; IV Piggyback:50; TPN:3131.6] Out: 7160 [Urine:5995; Drains:1090; Stool:75]  Intake/Output this shift:    Vent settings for last 24 hours: Vent Mode:  [-] PRVC FiO2 (%):  [40 %] 40 % Set Rate:  [16 bmp] 16 bmp Vt Set:  [550 mL] 550 mL PEEP:  [5 cmH20] 5 cmH20 Plateau Pressure:  [24 cmH20-27 cmH20] 27 cmH20  Physical Exam:  General: no respiratory distress Neuro: nonfocal exam Resp: clear to auscultation bilaterally CVS: regular rate and rhythm, S1, S2 normal, no murmur, click,  rub or gallop GI: Open abdominal wound with VAC in place.  No bowel sounds.  Stoma seems viable.  Drainage around the ostomy site is light but somewhat greenish.  Will investigate further at the time  Extremities: edema 4+, pulses doppler, , unequal size and even so the RLE is much better and softer.  Results for orders placed or performed during the hospital encounter of 05/07/15 (from the past 24 hour(s))  Heparin level (unfractionated)     Status: None   Collection Time: 05/24/15  8:30 AM  Result Value Ref Range   Heparin Unfractionated 0.63 0.30 - 0.70 IU/mL  Blood gas, arterial     Status: Abnormal   Collection Time: 05/25/15  3:25 AM  Result Value Ref Range   FIO2 0.40    Delivery systems VENTILATOR    Mode PRESSURE REGULATED VOLUME CONTROL    VT 550 mL   LHR 16 resp/min   Peep/cpap 5.0 cm H20   pH, Arterial 7.387 7.350 - 7.450   pCO2 arterial 48.6 (H) 35.0 - 45.0 mmHg   pO2, Arterial 100 80.0 - 100.0 mmHg   Bicarbonate 27.7 (H) 20.0 - 24.0 mEq/L   TCO2 29.0 0 - 100 mmol/L   Acid-Base Excess 3.5 (H) 0.0 - 2.0 mmol/L   O2 Saturation 96.5 %   Patient temperature 103.0    Collection site LEFT RADIAL    Drawn by [id number]    Sample type ARTERIAL DRAW    Allens test (pass/fail) PASS PASS  Urinalysis, Routine w reflex microscopic (not at [facility])     Status: Abnormal   Collection Time: 05/25/15  3:49 AM  Result Value Ref Range   Color, Urine AMBER (A) YELLOW   APPearance CLEAR CLEAR   Specific Gravity, Urine 1.015 1.005 - 1.030   pH 5.5 5.0 - 8.0   Glucose, UA NEGATIVE NEGATIVE mg/dL   Hgb urine dipstick NEGATIVE NEGATIVE   Bilirubin Urine SMALL (A) NEGATIVE   Ketones, ur NEGATIVE NEGATIVE mg/dL   Protein, ur NEGATIVE NEGATIVE mg/dL   Urobilinogen, UA 2.0 (H) 0.0 - 1.0 mg/dL   Nitrite NEGATIVE NEGATIVE   Leukocytes, UA NEGATIVE NEGATIVE  CBC     Status: Abnormal   Collection Time: 05/25/15  5:12 AM  Result Value Ref Range   WBC 35.2 (H) 4.0 - 10.5 K/uL   RBC 3.32 (L) 4.22 -  5.81 MIL/uL   Hemoglobin 10.0 (L) 13.0 - 17.0 g/dL   HCT 16.1 (L) 09.6 - 04.5 %   MCV 89.8 78.0 - 100.0 fL   MCH 30.1 26.0 - 34.0 pg   MCHC 33.6 30.0 - 36.0 g/dL   RDW 40.9 (H) 81.1 - 91.4 %   Platelets 597 (H) 150 - 400 K/uL  Heparin level (unfractionated)     Status: None   Collection Time: 05/25/15  5:12 AM  Result Value Ref Range   Heparin Unfractionated 0.36 0.30 - 0.70 IU/mL  Comprehensive metabolic panel     Status: Abnormal   Collection Time: 05/25/15  5:12 AM  Result Value Ref Range   Sodium 138 135 - 145 mmol/L   Potassium 5.0 3.5 - 5.1 mmol/L   Chloride 102 101 - 111 mmol/L   CO2 30 22 - 32 mmol/L   Glucose, Bld 137 (H) 65 - 99 mg/dL   BUN 19 6 - 20 mg/dL   Creatinine, Ser 7.82 0.61 - 1.24 mg/dL   Calcium 7.8 (L) 8.9 - 10.3 mg/dL   Total Protein 5.5 (L) 6.5 - 8.1 g/dL   Albumin 1.1 (L) 3.5 - 5.0 g/dL   AST 70 (H) 15 - 41 U/L   ALT 53 17 - 63 U/L   Alkaline Phosphatase 119 38 - 126 U/L   Total Bilirubin 1.4 (H) 0.3 - 1.2 mg/dL   GFR calc non Af Amer >60 >60 mL/min   GFR calc Af Amer >60 >60 mL/min   Anion gap 6 5 - 15  Magnesium     Status: None   Collection Time: 05/25/15  5:12 AM  Result Value Ref Range   Magnesium 1.8 1.7 - 2.4 mg/dL  Phosphorus     Status: None   Collection Time: 05/25/15  5:12 AM  Result Value Ref Range   Phosphorus 4.0 2.5 - 4.6 mg/dL  Triglycerides     Status: Abnormal   Collection Time: 05/25/15  5:12 AM  Result Value Ref Range   Triglycerides 227 (H) <150 mg/dL     Assessment/Plan:   NEURO  Altered Mental Status:  agitation, sedation and intermittent agitation.   Plan: Wean sedation at the appropriate time and try to get on trach collar.  PULM  Diffuse infiltrates bilaterally, cultures have grown ZPseudomonas   Plan: Continue antibiotics  CARDIO  No specific issues   Plan: CPM  RENAL  Urine output and rernal function are good.   Plan: CPM  GI  Open abdomen with bowel injury and necrotic stoma   Plan: Reassess the  patient in the OR today with planned new dressing device  ID  Pneumonia (hospital acquired (not ventilator-associated) Pseudomonas) Has WBC 35K. Exact source is unknown.  Hopefully  any pockets of pus will be identified at surgery.   Plan: OR today  HEME  Anemia acute blood loss anemia, anemia of chronic disease and anemia of critical illness)   Plan: No need for blood currently.  ENDO No specific issues.   Plan: CPM  Global Issues  Patient to g back to the OR for abdominal reassessment, possible ileostomy revision, and the beginning of partial abdominal closure with a new device    LOS: 18 days   Additional comments:I reviewed the patient's new clinical lab test results. cbc/bmet and I reviewed the patients new imaging test results. cxr  Critical Care Total Time*: 30 Minutes  Janiesha Diehl 05/25/2015  *Care during the described time interval was provided by me and/or other providers on the critical care team.  I have reviewed this patient's available data, including medical history, events of note, physical examination and test results as part of my evaluation.
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
## 2015-05-25 NOTE — Progress Notes (Signed)
Notified MD Lindie SpruceWyatt that patient still has heparin gtt off since prior to surgery. Orders received to resume heparin gtt at prior rate and to redraw heparin level in AM.
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
## 2015-05-25 NOTE — Progress Notes (Signed)
Pt arrived back from OR being bagged by OR staff. PT placed back on vent on previous settings. Tolerating well at this time.

## 2015-05-25 NOTE — Transfer of Care (Signed)
Immediate Anesthesia Transfer of Care Note  Patient: Elmon ElseBrian XXXCurrie  Procedure(s) Performed: Procedure(s): EXPLORATORY LAPAROTOMY AND WOUND REVISION (N/A) APPLICATION OF WOUND VAC (N/A)  Patient Location: PACU  Anesthesia Type:General  Level of Consciousness: sedated and Patient remains intubated per anesthesia plan  Airway & Oxygen Therapy: Patient remains intubated per anesthesia plan and Patient placed on Ventilator (see vital sign flow sheet for setting)  Post-op Assessment: Report given to RN and Post -op Vital signs reviewed and stable  Post vital signs: Reviewed and stable  Last Vitals:  Filed Vitals:   05/25/15 1842  BP: 126/88  Pulse: 89  Temp:   Resp: 25    Complications: No apparent anesthesia complications

## 2015-05-26 ENCOUNTER — Encounter (HOSPITAL_COMMUNITY): Payer: Self-pay | Admitting: General Surgery

## 2015-05-26 ENCOUNTER — Inpatient Hospital Stay (HOSPITAL_COMMUNITY): Payer: No Typology Code available for payment source

## 2015-05-26 LAB — BASIC METABOLIC PANEL
Anion gap: 9 (ref 5–15)
BUN: 17 mg/dL (ref 6–20)
CO2: 28 mmol/L (ref 22–32)
Calcium: 7.7 mg/dL — ABNORMAL LOW (ref 8.9–10.3)
Chloride: 100 mmol/L — ABNORMAL LOW (ref 101–111)
Creatinine, Ser: 0.71 mg/dL (ref 0.61–1.24)
GFR calc Af Amer: >60 mL/min (ref >60)
GFR calc non Af Amer: >60 mL/min (ref >60)
Glucose, Bld: 151 mg/dL — ABNORMAL HIGH (ref 65–99)
Potassium: 4.7 mmol/L (ref 3.5–5.1)
Sodium: 137 mmol/L (ref 135–145)

## 2015-05-26 LAB — CBC
HCT: 30.4 % — ABNORMAL LOW (ref 39.0–52.0)
Hemoglobin: 10.3 g/dL — ABNORMAL LOW (ref 13.0–17.0)
MCH: 30.6 pg (ref 26.0–34.0)
MCHC: 33.9 g/dL (ref 30.0–36.0)
MCV: 90.2 fL (ref 78.0–100.0)
Platelets: 624 10*3/uL — ABNORMAL HIGH (ref 150–400)
RBC: 3.37 MIL/uL — ABNORMAL LOW (ref 4.22–5.81)
RDW: 15.4 % (ref 11.5–15.5)
WBC: 49.3 10*3/uL — ABNORMAL HIGH (ref 4.0–10.5)

## 2015-05-26 LAB — HEPARIN LEVEL (UNFRACTIONATED): Heparin Unfractionated: 0.7 IU/mL (ref 0.30–0.70)

## 2015-05-26 LAB — MAGNESIUM: Magnesium: 1.8 mg/dL (ref 1.7–2.4)

## 2015-05-26 LAB — PHOSPHORUS: Phosphorus: 3.7 mg/dL (ref 2.5–4.6)

## 2015-05-26 LAB — GLUCOSE, CAPILLARY: Glucose-Capillary: 103 mg/dL — ABNORMAL HIGH (ref 65–99)

## 2015-05-26 MED ORDER — VANCOMYCIN HCL IN DEXTROSE 1-5 GM/200ML-% IV SOLN
1000.0000 mg | Freq: Three times a day (TID) | INTRAVENOUS | Status: AC
  Administered 2015-05-26 – 2015-06-01 (×18): 1000 mg via INTRAVENOUS
  Filled 2015-05-26 (×19): qty 200

## 2015-05-26 MED ORDER — VANCOMYCIN HCL 10 G IV SOLR
1500.0000 mg | Freq: Once | INTRAVENOUS | Status: AC
  Administered 2015-05-26: 1500 mg via INTRAVENOUS
  Filled 2015-05-26: qty 1500

## 2015-05-26 MED ORDER — ERTAPENEM SODIUM 1 G IJ SOLR: 1.0000 g | INTRAMUSCULAR | Status: DC

## 2015-05-26 MED ORDER — TRACE MINERALS CR-CU-MN-SE-ZN 10-1000-500-60 MCG/ML IV SOLN
INTRAVENOUS | Status: AC
  Administered 2015-05-26: 17:00:00 via INTRAVENOUS
  Filled 2015-05-26: qty 3000

## 2015-05-26 NOTE — Progress Notes (Signed)
Patient ID: Raymond Bowen, male   DOB: Jun 16, 1976, 39 y.o.   MRN: [id number] Follow up - Trauma and Critical Care  Patient Details:    Raymond Bowen is an 39 y.o. male. Sedated.  On Vent through trach collar Continues with fevers Lines/tubes : PICC / Midline Double Lumen 05/15/15 PICC Right Brachial 45 cm 3 cm (Active)  Indication for Insertion or Continuance of Line Administration of hyperosmolar/irritating solutions (i.e. TPN, Vancomycin, etc.);Vasoactive infusions;Prolonged intravenous therapies 05/25/2015  8:00 PM  Exposed Catheter (cm) 3 cm 05/15/2015 12:00 PM  Site Assessment Clean;Dry;Intact 05/25/2015  8:00 PM  Lumen #1 Status Infusing 05/25/2015  8:00 PM  Lumen #2 Status Infusing 05/25/2015  8:00 PM  Dressing Type Transparent 05/25/2015  8:00 PM  Dressing Status Clean;Dry;Intact;Antimicrobial disc in place 05/25/2015  8:00 PM  Line Care Connections checked and tightened;Zeroed and calibrated;Leveled 05/25/2015  8:00 PM  Line Adjustment (NICU/IV Team Only) No 05/19/2015  8:00 AM  Dressing Intervention New dressing;Dressing changed;Antimicrobial disc changed 05/22/2015 12:00 PM  Dressing Change Due 05/29/15 05/25/2015  8:00 PM     CVC Double Lumen 05/16/15 Right Internal jugular 20 cm 0 cm (Active)  Indication for Insertion or Continuance of Line Vasoactive infusions;Prolonged intravenous therapies;Administration of hyperosmolar/irritating solutions (i.e. TPN, Vancomycin, etc.) 05/25/2015  8:00 PM  Site Assessment Clean;Dry;Intact 05/25/2015  8:00 PM  Proximal Lumen Status Infusing 05/25/2015  8:00 PM  Distal Lumen Status Other (Comment) 05/25/2015  8:00 PM  Dressing Type Transparent 05/25/2015  8:00 PM  Dressing Status Clean;Dry;Intact 05/25/2015  8:00 PM  Line Care Connections checked and tightened;Zeroed and calibrated;Leveled 05/25/2015  8:00 PM  Dressing Intervention Antimicrobial disc changed;Dressing changed 05/23/2015  5:28 PM  Dressing Change Due 05/30/15 05/25/2015  8:00 PM      Closed System Drain 1 Right;Superior;Lateral Abdomen Bulb (JP) 19 Fr. (Active)  Site Description Unremarkable 05/25/2015  8:00 PM  Dressing Status None 05/25/2015  8:00 PM  Drainage Appearance Green;Tan;Clear 05/25/2015  8:00 PM  Status To suction (Charged) 05/25/2015  8:00 PM     Open Drain 1 Right Abdomen (Active)  Site Description Unremarkable 05/25/2015  7:00 AM  Dressing Status Clean;Dry;Intact 05/25/2015  7:00 AM  Drainage Appearance Serosanguineous 05/25/2015  7:00 AM  Status Unclamped 05/25/2015  7:00 AM  Output (mL) 60 mL 05/26/2015  6:00 AM     Negative Pressure Wound Therapy Abdomen (Active)  Site / Wound Assessment Dressing in place / Unable to assess 05/25/2015  8:00 PM  Peri-wound Assessment Intact 05/25/2015  8:00 PM  Cycle Continuous 05/25/2015  8:00 PM  Target Pressure (mmHg) 125 05/25/2015  8:00 PM  Canister Changed Yes 05/26/2015  3:00 AM  Dressing Status Intact 05/25/2015  8:00 PM  Drainage Amount Moderate 05/25/2015  8:00 PM  Drainage Description Serosanguineous 05/25/2015  8:00 PM  Output (mL) 500 mL 05/26/2015  3:00 AM     NG/OG Tube Nasogastric Left nare (Active)  Placement Verification Auscultation 05/25/2015  8:00 PM  Site Assessment Clean;Dry;Intact 05/25/2015  8:00 PM  Status Suction-low intermittent 05/25/2015  8:00 PM  Drainage Appearance Brown 05/25/2015  8:00 PM     Ileostomy RLQ (Active)  Ostomy Pouch 2 piece 05/25/2015  8:00 PM  Stoma Assessment Red 05/25/2015  8:00 PM  Peristomal Assessment Intact 05/25/2015  8:00 PM  Output (mL) 250 mL 05/26/2015  3:00 AM     Urethral Catheter [name], RN Latex 16 Fr. (Active)  Indication for Insertion or Continuance of Catheter Peri-operative use for selective surgical procedure 05/26/2015  7:30 AM  Site Assessment Clean;Intact;Dry 05/25/2015  8:00 PM  Catheter Maintenance Bag below level of bladder;Insertion date on drainage bag;Catheter secured;No dependent loops;Drainage bag/tubing not touching floor;Seal intact 05/26/2015  7:31 AM   Collection Container Standard drainage bag 05/25/2015  8:00 PM  Securement Method Securing device (Describe) 05/25/2015  8:00 PM  Urinary Catheter Interventions Unclamped 05/25/2015  8:00 PM  Output (mL) 500 mL 05/25/2015  2:26 PM    Microbiology/Sepsis markers: Results for orders placed or performed during the hospital encounter of 05/07/15  MRSA PCR Screening     Status: None   Collection Time: 05/07/15 11:43 PM  Result Value Ref Range Status   MRSA by PCR NEGATIVE NEGATIVE Final    Comment:        The GeneXpert MRSA Assay (FDA approved for NASAL specimens only), is one component of a comprehensive MRSA colonization surveillance program. It is not intended to diagnose MRSA infection nor to guide or monitor treatment for MRSA infections.   Culture, Urine     Status: None   Collection Time: 05/14/15 11:22 AM  Result Value Ref Range Status   Specimen Description URINE, CATHETERIZED  Final   Special Requests NONE  Final   Culture NO GROWTH 1 DAY  Final   Report Status 05/15/2015 FINAL  Final  Culture, expectorated sputum-assessment     Status: None   Collection Time: 05/15/15  8:50 AM  Result Value Ref Range Status   Specimen Description SPUTUM  Final   Special Requests Normal  Final   Sputum evaluation   Final    THIS SPECIMEN IS ACCEPTABLE. RESPIRATORY CULTURE REPORT TO FOLLOW.   Report Status 05/15/2015 FINAL  Final  Culture, respiratory (NON-Expectorated)     Status: None   Collection Time: 05/15/15  8:50 AM  Result Value Ref Range Status   Specimen Description SPUTUM  Final   Special Requests NONE  Final   Gram Stain   Final    ABUNDANT WBC PRESENT, PREDOMINANTLY PMN RARE SQUAMOUS EPITHELIAL CELLS PRESENT MODERATE GRAM NEGATIVE RODS FEW GRAM POSITIVE COCCI IN PAIRS    Culture   Final    MODERATE PSEUDOMONAS AERUGINOSA MODERATE HAEMOPHILUS INFLUENZAE Note: BETA LACTAMASE NEGATIVE Performed at [facility]    Report Status 05/18/2015 FINAL  Final    Organism ID, Bacteria PSEUDOMONAS AERUGINOSA  Final      Susceptibility   Pseudomonas aeruginosa - MIC*    CEFEPIME 2 SENSITIVE Sensitive     CEFTAZIDIME 4 SENSITIVE Sensitive     CIPROFLOXACIN <=0.25 SENSITIVE Sensitive     GENTAMICIN <=1 SENSITIVE Sensitive     IMIPENEM 1 SENSITIVE Sensitive     PIP/TAZO 8 SENSITIVE Sensitive     TOBRAMYCIN <=1 SENSITIVE Sensitive     * MODERATE PSEUDOMONAS AERUGINOSA  Culture, respiratory (NON-Expectorated)     Status: None   Collection Time: 05/17/15 12:25 PM  Result Value Ref Range Status   Specimen Description TRACHEAL ASPIRATE  Final   Special Requests Normal  Final   Gram Stain   Final    RARE WBC PRESENT, PREDOMINANTLY MONONUCLEAR RARE SQUAMOUS EPITHELIAL CELLS PRESENT NO ORGANISMS SEEN Performed at [facility]    Culture   Final    FEW PSEUDOMONAS AERUGINOSA Performed at [facility]    Report Status 05/20/2015 FINAL  Final   Organism ID, Bacteria PSEUDOMONAS AERUGINOSA  Final      Susceptibility   Pseudomonas aeruginosa - MIC*    CEFEPIME 2 SENSITIVE Sensitive     CEFTAZIDIME 4  SENSITIVE Sensitive     CIPROFLOXACIN <=0.25 SENSITIVE Sensitive     GENTAMICIN <=1 SENSITIVE Sensitive     IMIPENEM 1 SENSITIVE Sensitive     PIP/TAZO 8 SENSITIVE Sensitive     TOBRAMYCIN <=1 SENSITIVE Sensitive     * FEW PSEUDOMONAS AERUGINOSA  Anaerobic culture     Status: None   Collection Time: 05/18/15 12:30 PM  Result Value Ref Range Status   Specimen Description FLUID PERITONEAL  Final   Special Requests PT ON ERLAPENEM,MEROPENEM,ZOSYN  Final   Gram Stain   Final    FEW WBC PRESENT, PREDOMINANTLY PMN MODERATE GRAM POSITIVE RODS FEW GRAM POSITIVE COCCI IN PAIRS RARE GRAM NEGATIVE RODS    Culture   Final    MIXED ANAEROBIC FLORA PRESENT.  CALL LAB IF FURTHER IID REQUIRED.   Report Status 05/22/2015 FINAL  Final  Body fluid culture     Status: None   Collection Time: 05/18/15 12:30 PM  Result Value Ref Range Status    Specimen Description FLUID PERITONEAL  Final   Special Requests PT ON ERLAPENEM,MEROPENEM,ZOSYN  Final   Gram Stain   Final    FEW WBC PRESENT, PREDOMINANTLY PMN MODERATE GRAM POSITIVE RODS FEW GRAM POSITIVE COCCI IN PAIRS RARE GRAM NEGATIVE RODS    Culture   Final    RARE ESCHERICHIA COLI FEW ENTEROCOCCUS SPECIES RARE PSEUDOMONAS AERUGINOSA    Report Status 05/21/2015 FINAL  Final   Organism ID, Bacteria ESCHERICHIA COLI  Final   Organism ID, Bacteria ENTEROCOCCUS SPECIES  Final   Organism ID, Bacteria PSEUDOMONAS AERUGINOSA  Final      Susceptibility   Escherichia coli - MIC*    AMPICILLIN <=2 SENSITIVE Sensitive     CEFAZOLIN <=4 SENSITIVE Sensitive     CEFEPIME <=1 SENSITIVE Sensitive     CEFTAZIDIME <=1 SENSITIVE Sensitive     CEFTRIAXONE <=1 SENSITIVE Sensitive     CIPROFLOXACIN <=0.25 SENSITIVE Sensitive     GENTAMICIN <=1 SENSITIVE Sensitive     IMIPENEM <=0.25 SENSITIVE Sensitive     TRIMETH/SULFA <=20 SENSITIVE Sensitive     AMPICILLIN/SULBACTAM <=2 SENSITIVE Sensitive     PIP/TAZO <=4 SENSITIVE Sensitive     * RARE ESCHERICHIA COLI   Pseudomonas aeruginosa - MIC*    CEFTAZIDIME 4 SENSITIVE Sensitive     CIPROFLOXACIN <=0.25 SENSITIVE Sensitive     GENTAMICIN <=1 SENSITIVE Sensitive     IMIPENEM 1 SENSITIVE Sensitive     PIP/TAZO 8 SENSITIVE Sensitive     CEFEPIME 4 SENSITIVE Sensitive     * RARE PSEUDOMONAS AERUGINOSA   Enterococcus species - MIC*    AMPICILLIN <=2 SENSITIVE Sensitive     VANCOMYCIN <=0.5 SENSITIVE Sensitive     GENTAMICIN SYNERGY SENSITIVE Sensitive     * FEW ENTEROCOCCUS SPECIES  Culture, blood (routine x 2)     Status: None (Preliminary result)   Collection Time: 05/25/15  4:19 AM  Result Value Ref Range Status   Specimen Description BLOOD LEFT ANTECUBITAL  Final   Special Requests IN PEDIATRIC BOTTLE 3CC  Final   Culture  Setup Time   Final    GRAM POSITIVE COCCI IN CLUSTERS AEROBIC BOTTLE ONLY CRITICAL RESULT CALLED TO, READ BACK BY  AND VERIFIED WITH: KAREN  05/26/15 MKELLY    Culture PENDING  Incomplete   Report Status PENDING  Incomplete    Anti-infectives:  Anti-infectives    Start     Dose/Rate Route Frequency Ordered Stop   05/26/15 2000  vancomycin (VANCOCIN) IVPB  1000 mg/200 mL premix     1,000 mg 200 mL/hr over 60 Minutes Intravenous Every 8 hours 05/26/15 0755     05/26/15 0830  vancomycin (VANCOCIN) 1,500 mg in sodium chloride 0.9 % 500 mL IVPB     1,500 mg 250 mL/hr over 120 Minutes Intravenous  Once 05/26/15 0743     05/26/15 0730  ertapenem (INVANZ) 1 g in sodium chloride 0.9 % 50 mL IVPB  Status:  Discontinued     1 g 100 mL/hr over 30 Minutes Intravenous Every 24 hours 05/26/15 0729 05/26/15 0741   05/20/15 1330  piperacillin-tazobactam (ZOSYN) IVPB 3.375 g     3.375 g 12.5 mL/hr over 240 Minutes Intravenous Every 8 hours 05/20/15 1321     05/17/15 1000  ertapenem (INVANZ) 1 g in sodium chloride 0.9 % 50 mL IVPB  Status:  Discontinued     1 g 100 mL/hr over 30 Minutes Intravenous Every 24 hours 05/17/15 0936 05/17/15 0951   05/17/15 1000  meropenem (MERREM) 1 g in sodium chloride 0.9 % 100 mL IVPB  Status:  Discontinued     1 g 200 mL/hr over 30 Minutes Intravenous 3 times per day 05/17/15 0951 05/20/15 1321   05/15/15 0815  piperacillin-tazobactam (ZOSYN) IVPB 3.375 g  Status:  Discontinued     3.375 g 12.5 mL/hr over 240 Minutes Intravenous 3 times per day 05/15/15 0804 05/17/15 0951   05/08/15 0200  ceFAZolin (ANCEF) IVPB 2 g/50 mL premix     2 g 100 mL/hr over 30 Minutes Intravenous 3 times per day 05/08/15 0140 05/09/15 2238   05/08/15 0130  ceFAZolin (ANCEF) powder 2 g  Status:  Discontinued     2 g Other Every 8 hours 05/08/15 0129 05/08/15 0139      Best Practice/Protocols:  VTE Prophylaxis: Heparin (drip)   Consults: Treatment Team:  Nada LibmanVance W Brabham, MD    Events:  Subjective:    Overnight Issues: fever  Objective:  Vital signs for last 24 hours: Temp:  [98.8  F (37.1 C)-102.9 F (39.4 C)] 101.7 F (38.7 C) (11/04 0356) Pulse Rate:  [73-94] 84 (11/04 0700) Resp:  [14-29] 14 (11/04 0700) BP: (100-137)/(52-119) 119/66 mmHg (11/04 0700) SpO2:  [94 %-100 %] 99 % (11/04 0816) FiO2 (%):  [40 %] 40 % (11/04 0816)  Hemodynamic parameters for last 24 hours: CVP:  [0 mmHg-18 mmHg] 8 mmHg  Intake/Output from previous day: 11/03 0701 - 11/04 0700 In: 5255 [I.V.:2700; IV Piggyback:100; TPN:2455] Out: 3110 [Urine:2050; Drains:560; Stool:450; Blood:50]  Intake/Output this shift:    Vent settings for last 24 hours: Vent Mode:  [-] PRVC FiO2 (%):  [40 %] 40 % Set Rate:  [16 bmp] 16 bmp Vt Set:  [550 mL] 550 mL PEEP:  [5 cmH20] 5 cmH20 Plateau Pressure:  [23 cmH20-34 cmH20] 29 cmH20  Physical Exam:  Lungs clear Trach collar in place Wound closure device present Ostomy ischemic but functioning  Results for orders placed or performed during the hospital encounter of 05/07/15 (from the past 24 hour(s))  Glucose, capillary     Status: Abnormal   Collection Time: 05/25/15 11:40 PM  Result Value Ref Range   Glucose-Capillary 103 (H) 65 - 99 mg/dL  CBC     Status: Abnormal   Collection Time: 05/26/15  6:00 AM  Result Value Ref Range   WBC 49.3 (H) 4.0 - 10.5 K/uL   RBC 3.37 (L) 4.22 - 5.81 MIL/uL   Hemoglobin 10.3 (L) 13.0 - 17.0  g/dL   HCT 16.1 (L) 09.6 - 04.5 %   MCV 90.2 78.0 - 100.0 fL   MCH 30.6 26.0 - 34.0 pg   MCHC 33.9 30.0 - 36.0 g/dL   RDW 40.9 81.1 - 91.4 %   Platelets 624 (H) 150 - 400 K/uL  Heparin level (unfractionated)     Status: None   Collection Time: 05/26/15  6:00 AM  Result Value Ref Range   Heparin Unfractionated 0.70 0.30 - 0.70 IU/mL  Basic metabolic panel     Status: Abnormal   Collection Time: 05/26/15  6:00 AM  Result Value Ref Range   Sodium 137 135 - 145 mmol/L   Potassium 4.7 3.5 - 5.1 mmol/L   Chloride 100 (L) 101 - 111 mmol/L   CO2 28 22 - 32 mmol/L   Glucose, Bld 151 (H) 65 - 99 mg/dL   BUN 17 6 - 20  mg/dL   Creatinine, Ser 7.82 0.61 - 1.24 mg/dL   Calcium 7.7 (L) 8.9 - 10.3 mg/dL   GFR calc non Af Amer >60 >60 mL/min   GFR calc Af Amer >60 >60 mL/min   Anion gap 9 5 - 15  Magnesium     Status: None   Collection Time: 05/26/15  6:00 AM  Result Value Ref Range   Magnesium 1.8 1.7 - 2.4 mg/dL  Phosphorus     Status: None   Collection Time: 05/26/15  6:00 AM  Result Value Ref Range   Phosphorus 3.7 2.5 - 4.6 mg/dL     Assessment/Plan:   NEURO     Plan: continue sedation  PULM  On vent   Plan: continue current vent settings  CARDIO     Plan: stable  RENAL     Plan: good UOP  GI     Plan: continue TNA, NG and bowel rest  ID     Plan: fevers.  Continue antibiotics.  May be related to ostomy  HEME     Plan: H and H stable  ENDO    Plan:   Global Issues   Will do fascial massage later today   LOS: 19 days   Additional comments:  Critical Care Total Time*: 20 mintues  Seriah Brotzman A 05/26/2015  *Care during the described time interval was provided by me and/or other providers on the critical care team.  I have reviewed this patient's available data, including medical history, events of note, physical examination and test results as part of my evaluation.
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
## 2015-05-26 NOTE — Consult Note (Signed)
WOC reviewed chart, patient continues to go to the OR for [place] dressing changes and management of ostomy per bedside nurses.  WOC will follow along and reassess patient next week for needs.  Citlali Gautney [place] RN,CWOCN [phone]

## 2015-05-26 NOTE — Anesthesia Postprocedure Evaluation (Signed)
  Anesthesia Post-op Note  Patient: Raymond Bowen  Procedure(s) Performed: Procedure(s): EXPLORATORY LAPAROTOMY FOR OPEN ABDOMEN (N/A) ABDOMINAL VACUUM ASSISTED CLOSURE CHANGE (N/A)  Patient Location: ICU  Anesthesia Type:General  Level of Consciousness: sedated and Patient remains intubated per anesthesia plan  Airway and Oxygen Therapy: Patient remains intubated per anesthesia plan and Patient placed on Ventilator (see vital sign flow sheet for setting)  Post-op Pain: none  Post-op Assessment: Post-op Vital signs reviewed, No signs of Nausea or vomiting and Pain level controlled, remains acutely ill and ventilated, with complex abdominal wound LLE Motor Response: Non-purposeful movement   RLE Motor Response: Non-purposeful movement        Post-op Vital Signs: Reviewed and stable, remains intubated and sedated, intermittently requiring pressors for BP support  Last Vitals:  Filed Vitals:   05/26/15 1400  BP: 111/56  Pulse:   Temp:   Resp: 28    Complications: No apparent anesthesia complications

## 2015-05-27 DIAGNOSIS — S36409A Unspecified injury of unspecified part of small intestine, initial encounter: Secondary | ICD-10-CM | POA: Diagnosis present

## 2015-05-27 DIAGNOSIS — S21239A Puncture wound without foreign body of unspecified back wall of thorax without penetration into thoracic cavity, initial encounter: Secondary | ICD-10-CM

## 2015-05-27 DIAGNOSIS — J9600 Acute respiratory failure, unspecified whether with hypoxia or hypercapnia: Secondary | ICD-10-CM | POA: Diagnosis not present

## 2015-05-27 DIAGNOSIS — IMO0002 Reserved for concepts with insufficient information to code with codable children: Secondary | ICD-10-CM | POA: Diagnosis not present

## 2015-05-27 DIAGNOSIS — W3400XA Accidental discharge from unspecified firearms or gun, initial encounter: Secondary | ICD-10-CM

## 2015-05-27 DIAGNOSIS — D62 Acute posthemorrhagic anemia: Secondary | ICD-10-CM | POA: Diagnosis not present

## 2015-05-27 LAB — CBC
HCT: 27.6 % — ABNORMAL LOW (ref 39.0–52.0)
Hemoglobin: 8.7 g/dL — ABNORMAL LOW (ref 13.0–17.0)
MCH: 28.2 pg (ref 26.0–34.0)
MCHC: 31.5 g/dL (ref 30.0–36.0)
MCV: 89.6 fL (ref 78.0–100.0)
Platelets: 578 K/uL — ABNORMAL HIGH (ref 150–400)
RBC: 3.08 MIL/uL — ABNORMAL LOW (ref 4.22–5.81)
RDW: 15.7 % — ABNORMAL HIGH (ref 11.5–15.5)
WBC: 36.7 K/uL — ABNORMAL HIGH (ref 4.0–10.5)

## 2015-05-27 LAB — CULTURE, BLOOD (ROUTINE X 2)

## 2015-05-27 LAB — HEPARIN LEVEL (UNFRACTIONATED): Heparin Unfractionated: 0.62 IU/mL (ref 0.30–0.70)

## 2015-05-27 MED ORDER — TRACE MINERALS CR-CU-MN-SE-ZN 10-1000-500-60 MCG/ML IV SOLN
INTRAVENOUS | Status: AC
  Administered 2015-05-27: 18:00:00 via INTRAVENOUS
  Filled 2015-05-27: qty 3000

## 2015-05-27 MED ORDER — FUROSEMIDE 10 MG/ML IJ SOLN
40.0000 mg | Freq: Two times a day (BID) | INTRAMUSCULAR | Status: DC
  Administered 2015-05-27 – 2015-06-07 (×24): 40 mg via INTRAVENOUS
  Filled 2015-05-27 (×24): qty 4
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
## 2015-05-27 NOTE — Progress Notes (Addendum)
Foley was placed appx 1730 and at 1900, pt reported bladder pain.  It seemed as if the catheter was pulled out further than was anchored initially and urine was leaking around the site.  Pt and family requested that we remove it.  Foley D/C and condom catheter was replaced.
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
## 2015-05-27 NOTE — Procedures (Deleted)
No note

## 2015-05-28 LAB — CBC
HCT: 26.5 % — ABNORMAL LOW (ref 39.0–52.0)
Hemoglobin: 8.8 g/dL — ABNORMAL LOW (ref 13.0–17.0)
MCH: 29.2 pg (ref 26.0–34.0)
MCHC: 33.2 g/dL (ref 30.0–36.0)
MCV: 88 fL (ref 78.0–100.0)
Platelets: 621 10*3/uL — ABNORMAL HIGH (ref 150–400)
RBC: 3.01 MIL/uL — ABNORMAL LOW (ref 4.22–5.81)
RDW: 15.5 % (ref 11.5–15.5)
WBC: 32.6 10*3/uL — ABNORMAL HIGH (ref 4.0–10.5)

## 2015-05-28 LAB — BASIC METABOLIC PANEL
Anion gap: 8 (ref 5–15)
BUN: 16 mg/dL (ref 6–20)
CO2: 28 mmol/L (ref 22–32)
Calcium: 7.6 mg/dL — ABNORMAL LOW (ref 8.9–10.3)
Chloride: 99 mmol/L — ABNORMAL LOW (ref 101–111)
Creatinine, Ser: 0.72 mg/dL (ref 0.61–1.24)
GFR calc Af Amer: >60 mL/min (ref >60)
GFR calc non Af Amer: >60 mL/min (ref >60)
Glucose, Bld: 133 mg/dL — ABNORMAL HIGH (ref 65–99)
Potassium: 3.9 mmol/L (ref 3.5–5.1)
Sodium: 135 mmol/L (ref 135–145)

## 2015-05-28 LAB — HEPARIN LEVEL (UNFRACTIONATED): Heparin Unfractionated: 0.52 IU/mL (ref 0.30–0.70)

## 2015-05-28 MED ORDER — TRACE MINERALS CR-CU-MN-SE-ZN 10-1000-500-60 MCG/ML IV SOLN
INTRAVENOUS | Status: AC
  Administered 2015-05-28: 18:00:00 via INTRAVENOUS
  Filled 2015-05-28: qty 3000

## 2015-05-28 NOTE — Progress Notes (Signed)
Patient ID: Raymond Bowen, male   DOB: 1976/05/02, 39 y.o.   MRN: [id number] Follow up - Trauma and Critical Care  Patient Details:    Raymond Bowen is an 39 y.o. male.  Lines/tubes : PICC / Midline Double Lumen 05/15/15 PICC Right Brachial 45 cm 3 cm (Active)  Indication for Insertion or Continuance of Line Administration of hyperosmolar/irritating solutions (i.e. TPN, Vancomycin, etc.);Vasoactive infusions;Prolonged intravenous therapies 05/27/2015  8:00 PM  Exposed Catheter (cm) 3 cm 05/15/2015 12:00 PM  Site Assessment Clean;Dry;Intact 05/27/2015  8:00 PM  Lumen #1 Status Infusing 05/27/2015  8:00 PM  Lumen #2 Status Infusing 05/27/2015  8:00 PM  Dressing Type Transparent 05/27/2015  8:00 PM  Dressing Status Clean;Dry;Intact;Antimicrobial disc in place 05/27/2015  8:00 PM  Line Care Connections checked and tightened 05/27/2015  8:00 PM  Line Adjustment (NICU/IV Team Only) No 05/19/2015  8:00 AM  Dressing Intervention New dressing;Dressing changed;Antimicrobial disc changed 05/22/2015 12:00 PM  Dressing Change Due 05/29/15 05/27/2015  8:00 PM     CVC Double Lumen 05/16/15 Right Internal jugular 20 cm 0 cm (Active)  Indication for Insertion or Continuance of Line Vasoactive infusions;Prolonged intravenous therapies;Administration of hyperosmolar/irritating solutions (i.e. TPN, Vancomycin, etc.) 05/27/2015  8:00 PM  Site Assessment Clean;Dry;Intact 05/27/2015  8:00 PM  Proximal Lumen Status Infusing 05/27/2015  8:00 PM  Distal Lumen Status Infusing 05/27/2015  8:00 PM  Dressing Type Transparent 05/27/2015  8:00 PM  Dressing Status Clean;Dry;Intact 05/27/2015  8:00 PM  Line Care Connections checked and tightened;Zeroed and calibrated;Leveled 05/27/2015  8:00 PM  Dressing Intervention Antimicrobial disc changed;Dressing changed 05/23/2015  5:28 PM  Dressing Change Due 05/30/15 05/27/2015  8:00 PM     Closed System Drain 1 Right Abdomen Bulb (JP) 19 Fr. (Active)  Site Description Other (Comment)  05/27/2015  8:00 PM  Dressing Status Intact;Dry 05/27/2015  8:00 PM  Drainage Appearance Clots;Bloody 05/27/2015  8:00 PM  Status To suction (Charged) 05/27/2015  8:00 PM  Output (mL) 20 mL 05/28/2015  6:00 AM     Negative Pressure Wound Therapy Abdomen (Active)  Last dressing change 05/25/15 05/27/2015  8:00 PM  Site / Wound Assessment Dressing in place / Unable to assess 05/27/2015  8:00 PM  Peri-wound Assessment Intact 05/27/2015  8:00 PM  Cycle Continuous 05/27/2015  8:00 PM  Target Pressure (mmHg) 125 05/27/2015  8:00 PM  Canister Changed Yes 05/28/2015  3:00 AM  Dressing Status Intact 05/27/2015  8:00 PM  Drainage Amount Moderate 05/27/2015  8:00 PM  Drainage Description Serosanguineous 05/27/2015  8:00 PM  Output (mL) 200 mL 05/28/2015  6:00 AM     NG/OG Tube Nasogastric Left nare (Active)  Placement Verification Auscultation 05/27/2015  8:00 PM  Site Assessment Clean;Dry;Intact 05/27/2015  8:00 PM  Status Suction-low intermittent 05/27/2015  8:00 PM  Drainage Appearance Bile;Green 05/27/2015  8:00 PM     Ileostomy RLQ (Active)  Ostomy Pouch 2 piece 05/27/2015  8:00 PM  Stoma Assessment Edema;Other (comment) 05/27/2015  8:00 PM  Peristomal Assessment Intact 05/27/2015  8:00 PM  Output (mL) 50 mL 05/28/2015  6:00 AM     Urethral Catheter [name], RN Latex 16 Fr. (Active)  Indication for Insertion or Continuance of Catheter Peri-operative use for selective surgical procedure 05/27/2015  8:00 PM  Site Assessment Clean;Intact;Dry 05/27/2015  8:00 PM  Catheter Maintenance Bag below level of bladder;Drainage bag/tubing not touching floor;Catheter secured;Insertion date on drainage bag;No dependent loops;Seal intact 05/27/2015  8:00 PM  Collection Container Standard drainage bag 05/27/2015  8:00 PM  Securement Method Securing device (Describe) 05/27/2015  8:00 PM  Urinary Catheter Interventions Unclamped 05/27/2015  8:00 PM  Output (mL) 500 mL 05/25/2015  2:26 PM    Microbiology/Sepsis markers: Results for  orders placed or performed during the hospital encounter of 05/07/15  MRSA PCR Screening     Status: None   Collection Time: 05/07/15 11:43 PM  Result Value Ref Range Status   MRSA by PCR NEGATIVE NEGATIVE Final    Comment:        The GeneXpert MRSA Assay (FDA approved for NASAL specimens only), is one component of a comprehensive MRSA colonization surveillance program. It is not intended to diagnose MRSA infection nor to guide or monitor treatment for MRSA infections.   Culture, Urine     Status: None   Collection Time: 05/14/15 11:22 AM  Result Value Ref Range Status   Specimen Description URINE, CATHETERIZED  Final   Special Requests NONE  Final   Culture NO GROWTH 1 DAY  Final   Report Status 05/15/2015 FINAL  Final  Culture, expectorated sputum-assessment     Status: None   Collection Time: 05/15/15  8:50 AM  Result Value Ref Range Status   Specimen Description SPUTUM  Final   Special Requests Normal  Final   Sputum evaluation   Final    THIS SPECIMEN IS ACCEPTABLE. RESPIRATORY CULTURE REPORT TO FOLLOW.   Report Status 05/15/2015 FINAL  Final  Culture, respiratory (NON-Expectorated)     Status: None   Collection Time: 05/15/15  8:50 AM  Result Value Ref Range Status   Specimen Description SPUTUM  Final   Special Requests NONE  Final   Gram Stain   Final    ABUNDANT WBC PRESENT, PREDOMINANTLY PMN RARE SQUAMOUS EPITHELIAL CELLS PRESENT MODERATE GRAM NEGATIVE RODS FEW GRAM POSITIVE COCCI IN PAIRS    Culture   Final    MODERATE PSEUDOMONAS AERUGINOSA MODERATE HAEMOPHILUS INFLUENZAE Note: BETA LACTAMASE NEGATIVE Performed at [facility]    Report Status 05/18/2015 FINAL  Final   Organism ID, Bacteria PSEUDOMONAS AERUGINOSA  Final      Susceptibility   Pseudomonas aeruginosa - MIC*    CEFEPIME 2 SENSITIVE Sensitive     CEFTAZIDIME 4 SENSITIVE Sensitive     CIPROFLOXACIN <=0.25 SENSITIVE Sensitive     GENTAMICIN <=1 SENSITIVE Sensitive     IMIPENEM 1  SENSITIVE Sensitive     PIP/TAZO 8 SENSITIVE Sensitive     TOBRAMYCIN <=1 SENSITIVE Sensitive     * MODERATE PSEUDOMONAS AERUGINOSA  Culture, respiratory (NON-Expectorated)     Status: None   Collection Time: 05/17/15 12:25 PM  Result Value Ref Range Status   Specimen Description TRACHEAL ASPIRATE  Final   Special Requests Normal  Final   Gram Stain   Final    RARE WBC PRESENT, PREDOMINANTLY MONONUCLEAR RARE SQUAMOUS EPITHELIAL CELLS PRESENT NO ORGANISMS SEEN Performed at [facility]    Culture   Final    FEW PSEUDOMONAS AERUGINOSA Performed at [facility]    Report Status 05/20/2015 FINAL  Final   Organism ID, Bacteria PSEUDOMONAS AERUGINOSA  Final      Susceptibility   Pseudomonas aeruginosa - MIC*    CEFEPIME 2 SENSITIVE Sensitive     CEFTAZIDIME 4 SENSITIVE Sensitive     CIPROFLOXACIN <=0.25 SENSITIVE Sensitive     GENTAMICIN <=1 SENSITIVE Sensitive     IMIPENEM 1 SENSITIVE Sensitive     PIP/TAZO 8 SENSITIVE Sensitive     TOBRAMYCIN <=1 SENSITIVE Sensitive     *  FEW PSEUDOMONAS AERUGINOSA  Anaerobic culture     Status: None   Collection Time: 05/18/15 12:30 PM  Result Value Ref Range Status   Specimen Description FLUID PERITONEAL  Final   Special Requests PT ON ERLAPENEM,MEROPENEM,ZOSYN  Final   Gram Stain   Final    FEW WBC PRESENT, PREDOMINANTLY PMN MODERATE GRAM POSITIVE RODS FEW GRAM POSITIVE COCCI IN PAIRS RARE GRAM NEGATIVE RODS    Culture   Final    MIXED ANAEROBIC FLORA PRESENT.  CALL LAB IF FURTHER IID REQUIRED.   Report Status 05/22/2015 FINAL  Final  Body fluid culture     Status: None   Collection Time: 05/18/15 12:30 PM  Result Value Ref Range Status   Specimen Description FLUID PERITONEAL  Final   Special Requests PT ON ERLAPENEM,MEROPENEM,ZOSYN  Final   Gram Stain   Final    FEW WBC PRESENT, PREDOMINANTLY PMN MODERATE GRAM POSITIVE RODS FEW GRAM POSITIVE COCCI IN PAIRS RARE GRAM NEGATIVE RODS    Culture   Final    RARE  ESCHERICHIA COLI FEW ENTEROCOCCUS SPECIES RARE PSEUDOMONAS AERUGINOSA    Report Status 05/21/2015 FINAL  Final   Organism ID, Bacteria ESCHERICHIA COLI  Final   Organism ID, Bacteria ENTEROCOCCUS SPECIES  Final   Organism ID, Bacteria PSEUDOMONAS AERUGINOSA  Final      Susceptibility   Escherichia coli - MIC*    AMPICILLIN <=2 SENSITIVE Sensitive     CEFAZOLIN <=4 SENSITIVE Sensitive     CEFEPIME <=1 SENSITIVE Sensitive     CEFTAZIDIME <=1 SENSITIVE Sensitive     CEFTRIAXONE <=1 SENSITIVE Sensitive     CIPROFLOXACIN <=0.25 SENSITIVE Sensitive     GENTAMICIN <=1 SENSITIVE Sensitive     IMIPENEM <=0.25 SENSITIVE Sensitive     TRIMETH/SULFA <=20 SENSITIVE Sensitive     AMPICILLIN/SULBACTAM <=2 SENSITIVE Sensitive     PIP/TAZO <=4 SENSITIVE Sensitive     * RARE ESCHERICHIA COLI   Pseudomonas aeruginosa - MIC*    CEFTAZIDIME 4 SENSITIVE Sensitive     CIPROFLOXACIN <=0.25 SENSITIVE Sensitive     GENTAMICIN <=1 SENSITIVE Sensitive     IMIPENEM 1 SENSITIVE Sensitive     PIP/TAZO 8 SENSITIVE Sensitive     CEFEPIME 4 SENSITIVE Sensitive     * RARE PSEUDOMONAS AERUGINOSA   Enterococcus species - MIC*    AMPICILLIN <=2 SENSITIVE Sensitive     VANCOMYCIN <=0.5 SENSITIVE Sensitive     GENTAMICIN SYNERGY SENSITIVE Sensitive     * FEW ENTEROCOCCUS SPECIES  Culture, blood (routine x 2)     Status: None   Collection Time: 05/25/15  4:19 AM  Result Value Ref Range Status   Specimen Description BLOOD LEFT ANTECUBITAL  Final   Special Requests IN PEDIATRIC BOTTLE 3CC  Final   Culture  Setup Time   Final    GRAM POSITIVE COCCI IN CLUSTERS AEROBIC BOTTLE ONLY CRITICAL RESULT CALLED TO, READ BACK BY AND VERIFIED WITH: KAREN @0208  05/26/15 MKELLY    Culture   Final    STAPHYLOCOCCUS SPECIES (COAGULASE NEGATIVE) THE SIGNIFICANCE OF ISOLATING THIS ORGANISM FROM A SINGLE SET OF BLOOD CULTURES WHEN MULTIPLE SETS ARE DRAWN IS UNCERTAIN. PLEASE NOTIFY THE MICROBIOLOGY DEPARTMENT WITHIN ONE WEEK IF  SPECIATION AND SENSITIVITIES ARE REQUIRED.    Report Status 05/27/2015 FINAL  Final  Culture, blood (routine x 2)     Status: None (Preliminary result)   Collection Time: 05/25/15  6:21 AM  Result Value Ref Range Status   Specimen Description BLOOD  LEFT ANTECUBITAL  Final   Special Requests BOTTLES DRAWN AEROBIC ONLY 5CCS  Final   Culture NO GROWTH 2 DAYS  Final   Report Status PENDING  Incomplete    Anti-infectives:  Anti-infectives    Start     Dose/Rate Route Frequency Ordered Stop   05/26/15 2000  vancomycin (VANCOCIN) IVPB 1000 mg/200 mL premix     1,000 mg 200 mL/hr over 60 Minutes Intravenous Every 8 hours 05/26/15 0755     05/26/15 0830  vancomycin (VANCOCIN) 1,500 mg in sodium chloride 0.9 % 500 mL IVPB     1,500 mg 250 mL/hr over 120 Minutes Intravenous  Once 05/26/15 0743 05/26/15 1021   05/26/15 0730  ertapenem (INVANZ) 1 g in sodium chloride 0.9 % 50 mL IVPB  Status:  Discontinued     1 g 100 mL/hr over 30 Minutes Intravenous Every 24 hours 05/26/15 0729 05/26/15 0741   05/20/15 1330  piperacillin-tazobactam (ZOSYN) IVPB 3.375 g     3.375 g 12.5 mL/hr over 240 Minutes Intravenous Every 8 hours 05/20/15 1321     05/17/15 1000  ertapenem (INVANZ) 1 g in sodium chloride 0.9 % 50 mL IVPB  Status:  Discontinued     1 g 100 mL/hr over 30 Minutes Intravenous Every 24 hours 05/17/15 0936 05/17/15 0951   05/17/15 1000  meropenem (MERREM) 1 g in sodium chloride 0.9 % 100 mL IVPB  Status:  Discontinued     1 g 200 mL/hr over 30 Minutes Intravenous 3 times per day 05/17/15 0951 05/20/15 1321   05/15/15 0815  piperacillin-tazobactam (ZOSYN) IVPB 3.375 g  Status:  Discontinued     3.375 g 12.5 mL/hr over 240 Minutes Intravenous 3 times per day 05/15/15 0804 05/17/15 0951   05/08/15 0200  ceFAZolin (ANCEF) IVPB 2 g/50 mL premix     2 g 100 mL/hr over 30 Minutes Intravenous 3 times per day 05/08/15 0140 05/09/15 2238   05/08/15 0130  ceFAZolin (ANCEF) powder 2 g  Status:   Discontinued     2 g Other Every 8 hours 05/08/15 0129 05/08/15 0139      Best Practice/Protocols:    Consults: Treatment Team:  Nada Libman, MD    Events:  Subjective:    Overnight Issues: Remains hemodynamically stable.  Still with fevers  Objective:  Vital signs for last 24 hours: Temp:  [98.3 F (36.8 C)-101.7 F (38.7 C)] 101.1 F (38.4 C) (11/06 0725) Pulse Rate:  [74-101] 78 (11/06 0500) Resp:  [0-29] 26 (11/06 0500) BP: (102-131)/(63-88) 108/68 mmHg (11/06 0500) SpO2:  [97 %-100 %] 98 % (11/06 0737) FiO2 (%):  [40 %] 40 % (11/06 0737)  Hemodynamic parameters for last 24 hours: CVP:  [9 mmHg-11 mmHg] 11 mmHg  Intake/Output from previous day: 11/05 0701 - 11/06 0700 In: 5129.4 [I.V.:1754.4; IV Piggyback:500; ZOX:0960] Out: 4540 [JWJXB:1478; Drains:785; Stool:250]  Intake/Output this shift:    Vent settings for last 24 hours: Vent Mode:  [-] PRVC FiO2 (%):  [40 %] 40 % Set Rate:  [16 bmp-167 bmp] 167 bmp Vt Set:  [550 mL] 550 mL PEEP:  [5 cmH20] 5 cmH20 Plateau Pressure:  [23 cmH20-30 cmH20] 23 cmH20  Physical Exam:  Lungs clear CV RRR Abdomen with device in place Ostomy remains ischemic but productive.  Still above the skin.  Results for orders placed or performed during the hospital encounter of 05/07/15 (from the past 24 hour(s))  CBC     Status: Abnormal   Collection Time:  05/28/15  5:30 AM  Result Value Ref Range   WBC 32.6 (H) 4.0 - 10.5 K/uL   RBC 3.01 (L) 4.22 - 5.81 MIL/uL   Hemoglobin 8.8 (L) 13.0 - 17.0 g/dL   HCT 16.126.5 (L) 09.639.0 - 04.552.0 %   MCV 88.0 78.0 - 100.0 fL   MCH 29.2 26.0 - 34.0 pg   MCHC 33.2 30.0 - 36.0 g/dL   RDW 40.915.5 81.111.5 - 91.415.5 %   Platelets 621 (H) 150 - 400 K/uL  Basic metabolic panel     Status: Abnormal   Collection Time: 05/28/15  5:30 AM  Result Value Ref Range   Sodium 135 135 - 145 mmol/L   Potassium 3.9 3.5 - 5.1 mmol/L   Chloride 99 (L) 101 - 111 mmol/L   CO2 28 22 - 32 mmol/L   Glucose, Bld 133 (H)  65 - 99 mg/dL   BUN 16 6 - 20 mg/dL   Creatinine, Ser 7.820.72 0.61 - 1.24 mg/dL   Calcium 7.6 (L) 8.9 - 10.3 mg/dL   GFR calc non Af Amer >60 >60 mL/min   GFR calc Af Amer >60 >60 mL/min   Anion gap 8 5 - 15  Heparin level (unfractionated)     Status: None   Collection Time: 05/28/15  6:50 AM  Result Value Ref Range   Heparin Unfractionated 0.52 0.30 - 0.70 IU/mL     Assessment/Plan:   Continuing device on abdomen.  Fascial massage today. Continuing current vent settings.  No plan for ween today For nutrition, will continue bowel rest and TNA Continue current antibiotics      Chieko Neises A 05/28/2015  *Care during the described time interval was provided by me and/or other providers on the critical care team.  I have reviewed this patient's available data, including medical history, events of note, physical examination and test results as part of my evaluation.
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
## 2015-05-29 LAB — HEPARIN LEVEL (UNFRACTIONATED): Heparin Unfractionated: 0.3 IU/mL (ref 0.30–0.70)

## 2015-05-29 LAB — COMPREHENSIVE METABOLIC PANEL
ALT: 128 U/L — ABNORMAL HIGH (ref 17–63)
AST: 67 U/L — ABNORMAL HIGH (ref 15–41)
Albumin: <1 g/dL — ABNORMAL LOW (ref 3.5–5.0)
Alkaline Phosphatase: 107 U/L (ref 38–126)
Anion gap: 5 (ref 5–15)
BUN: 17 mg/dL (ref 6–20)
CO2: 28 mmol/L (ref 22–32)
Calcium: 7.6 mg/dL — ABNORMAL LOW (ref 8.9–10.3)
Chloride: 100 mmol/L — ABNORMAL LOW (ref 101–111)
Creatinine, Ser: 0.66 mg/dL (ref 0.61–1.24)
GFR calc Af Amer: >60 mL/min (ref >60)
GFR calc non Af Amer: >60 mL/min (ref >60)
Glucose, Bld: 145 mg/dL — ABNORMAL HIGH (ref 65–99)
Potassium: 4.2 mmol/L (ref 3.5–5.1)
Sodium: 133 mmol/L — ABNORMAL LOW (ref 135–145)
Total Bilirubin: 2 mg/dL — ABNORMAL HIGH (ref 0.3–1.2)
Total Protein: 6 g/dL — ABNORMAL LOW (ref 6.5–8.1)

## 2015-05-29 LAB — CBC
HCT: 25.8 % — ABNORMAL LOW (ref 39.0–52.0)
Hemoglobin: 8.5 g/dL — ABNORMAL LOW (ref 13.0–17.0)
MCH: 29.1 pg (ref 26.0–34.0)
MCHC: 32.9 g/dL (ref 30.0–36.0)
MCV: 88.4 fL (ref 78.0–100.0)
Platelets: 611 10*3/uL — ABNORMAL HIGH (ref 150–400)
RBC: 2.92 MIL/uL — ABNORMAL LOW (ref 4.22–5.81)
RDW: 15.6 % — ABNORMAL HIGH (ref 11.5–15.5)
WBC: 31.6 10*3/uL — ABNORMAL HIGH (ref 4.0–10.5)

## 2015-05-29 LAB — PHOSPHORUS: Phosphorus: 3.9 mg/dL (ref 2.5–4.6)

## 2015-05-29 LAB — PREALBUMIN: Prealbumin: 7.1 mg/dL — ABNORMAL LOW (ref 18–38)

## 2015-05-29 LAB — MAGNESIUM: Magnesium: 1.7 mg/dL (ref 1.7–2.4)

## 2015-05-29 LAB — TRIGLYCERIDES: Triglycerides: 210 mg/dL — ABNORMAL HIGH (ref <150)

## 2015-05-29 MED ORDER — TRACE MINERALS CR-CU-MN-SE-ZN 10-1000-500-60 MCG/ML IV SOLN
INTRAVENOUS | Status: AC
  Administered 2015-05-29: 18:00:00 via INTRAVENOUS
  Filled 2015-05-29: qty 3000

## 2015-05-29 MED ORDER — MAGNESIUM SULFATE IN D5W 10-5 MG/ML-% IV SOLN
1.0000 g | Freq: Once | INTRAVENOUS | Status: AC
  Administered 2015-05-29: 1 g via INTRAVENOUS
  Filled 2015-05-29: qty 100

## 2015-05-29 MED ORDER — VECURONIUM BROMIDE 10 MG IV SOLR
INTRAVENOUS | Status: AC
  Administered 2015-05-29: 10 mg
  Filled 2015-05-29: qty 10
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
## 2015-05-29 NOTE — Progress Notes (Signed)
Follow up - Trauma and Critical Care  Patient Details:    Raymond Bowen is an 39 y.o. male.  Lines/tubes : PICC / Midline Double Lumen 05/15/15 PICC Right Brachial 45 cm 3 cm (Active)  Indication for Insertion or Continuance of Line Administration of hyperosmolar/irritating solutions (i.e. TPN, Vancomycin, etc.);Vasoactive infusions;Prolonged intravenous therapies 05/28/2015  8:00 PM  Exposed Catheter (cm) 3 cm 05/15/2015 12:00 PM  Site Assessment Clean;Dry;Intact 05/28/2015  8:00 PM  Lumen #1 Status Infusing 05/28/2015  8:00 PM  Lumen #2 Status Infusing 05/28/2015  8:00 PM  Dressing Type Transparent 05/28/2015  8:00 PM  Dressing Status Clean;Dry;Intact;Antimicrobial disc in place 05/28/2015  8:00 PM  Line Care Tubing changed;Connections checked and tightened 05/29/2015 12:00 AM  Line Adjustment (NICU/IV Team Only) No 05/19/2015  8:00 AM  Dressing Intervention New dressing;Dressing changed;Antimicrobial disc changed 05/22/2015 12:00 PM  Dressing Change Due 06/05/15 05/29/2015 12:00 AM     CVC Double Lumen 05/16/15 Right Internal jugular 20 cm 0 cm (Active)  Indication for Insertion or Continuance of Line Vasoactive infusions;Prolonged intravenous therapies;Administration of hyperosmolar/irritating solutions (i.e. TPN, Vancomycin, etc.) 05/28/2015  8:00 PM  Site Assessment Clean;Dry;Intact 05/28/2015  8:00 PM  Proximal Lumen Status Infusing 05/28/2015  8:00 PM  Distal Lumen Status Infusing;Blood return noted 05/28/2015  8:00 PM  Dressing Type Transparent 05/28/2015  8:00 PM  Dressing Status Clean;Dry;Intact 05/28/2015  8:00 PM  Line Care Tubing changed;Zeroed and calibrated;Leveled;Connections checked and tightened 05/29/2015 12:00 AM  Dressing Intervention Antimicrobial disc changed;Dressing changed 05/23/2015  5:28 PM  Dressing Change Due 06/05/15 05/29/2015 12:00 AM     Closed System Drain 1 Right Abdomen Bulb (JP) 19 Fr. (Active)  Site Description Other (Comment) 05/28/2015  8:00 PM  Dressing  Status Intact;Dry 05/28/2015  8:00 PM  Drainage Appearance Clots;Bloody 05/28/2015  8:00 PM  Status To suction (Charged) 05/28/2015  8:00 PM  Output (mL) 40 mL 05/29/2015  6:00 AM     Negative Pressure Wound Therapy Abdomen (Active)  Last dressing change 05/25/15 05/28/2015  8:00 PM  Site / Wound Assessment Dressing in place / Unable to assess 05/28/2015  8:00 PM  Peri-wound Assessment Intact 05/28/2015  8:00 PM  Cycle Continuous 05/28/2015  8:00 PM  Target Pressure (mmHg) 125 05/28/2015  8:00 PM  Canister Changed No 05/28/2015  8:00 PM  Dressing Status Intact 05/28/2015  8:00 PM  Drainage Amount Moderate 05/28/2015  8:00 PM  Drainage Description Serosanguineous 05/28/2015  8:00 PM  Output (mL) 450 mL 05/29/2015  6:00 AM     NG/OG Tube Nasogastric Left nare (Active)  Placement Verification Auscultation 05/28/2015  8:00 PM  Site Assessment Clean;Dry;Intact 05/28/2015  8:00 PM  Status Suction-low intermittent 05/28/2015  8:00 PM  Drainage Appearance Bile;Green 05/28/2015  8:00 PM     Ileostomy RLQ (Active)  Ostomy Pouch 2 piece 05/28/2015  8:00 PM  Stoma Assessment Edema;Other (comment) 05/28/2015  8:00 PM  Peristomal Assessment Intact 05/28/2015  8:00 PM  Output (mL) 70 mL 05/29/2015  6:00 AM     Urethral Catheter [name], RN Latex 16 Fr. (Active)  Indication for Insertion or Continuance of Catheter Peri-operative use for selective surgical procedure 05/28/2015  8:00 PM  Site Assessment Clean;Intact;Dry 05/28/2015  8:00 PM  Catheter Maintenance Bag below level of bladder;Drainage bag/tubing not touching floor;Catheter secured;Insertion date on drainage bag;No dependent loops;Seal intact 05/28/2015  8:00 PM  Collection Container Standard drainage bag 05/28/2015  8:00 PM  Securement Method Securing device (Describe) 05/28/2015  8:00 PM  Urinary Catheter Interventions Unclamped  05/28/2015  8:00 PM  Output (mL) 500 mL 05/25/2015  2:26 PM    Microbiology/Sepsis markers: Results for orders placed or performed  during the hospital encounter of 05/07/15  MRSA PCR Screening     Status: None   Collection Time: 05/07/15 11:43 PM  Result Value Ref Range Status   MRSA by PCR NEGATIVE NEGATIVE Final    Comment:        The GeneXpert MRSA Assay (FDA approved for NASAL specimens only), is one component of a comprehensive MRSA colonization surveillance program. It is not intended to diagnose MRSA infection nor to guide or monitor treatment for MRSA infections.   Culture, Urine     Status: None   Collection Time: 05/14/15 11:22 AM  Result Value Ref Range Status   Specimen Description URINE, CATHETERIZED  Final   Special Requests NONE  Final   Culture NO GROWTH 1 DAY  Final   Report Status 05/15/2015 FINAL  Final  Culture, expectorated sputum-assessment     Status: None   Collection Time: 05/15/15  8:50 AM  Result Value Ref Range Status   Specimen Description SPUTUM  Final   Special Requests Normal  Final   Sputum evaluation   Final    THIS SPECIMEN IS ACCEPTABLE. RESPIRATORY CULTURE REPORT TO FOLLOW.   Report Status 05/15/2015 FINAL  Final  Culture, respiratory (NON-Expectorated)     Status: None   Collection Time: 05/15/15  8:50 AM  Result Value Ref Range Status   Specimen Description SPUTUM  Final   Special Requests NONE  Final   Gram Stain   Final    ABUNDANT WBC PRESENT, PREDOMINANTLY PMN RARE SQUAMOUS EPITHELIAL CELLS PRESENT MODERATE GRAM NEGATIVE RODS FEW GRAM POSITIVE COCCI IN PAIRS    Culture   Final    MODERATE PSEUDOMONAS AERUGINOSA MODERATE HAEMOPHILUS INFLUENZAE Note: BETA LACTAMASE NEGATIVE Performed at [facility]    Report Status 05/18/2015 FINAL  Final   Organism ID, Bacteria PSEUDOMONAS AERUGINOSA  Final      Susceptibility   Pseudomonas aeruginosa - MIC*    CEFEPIME 2 SENSITIVE Sensitive     CEFTAZIDIME 4 SENSITIVE Sensitive     CIPROFLOXACIN <=0.25 SENSITIVE Sensitive     GENTAMICIN <=1 SENSITIVE Sensitive     IMIPENEM 1 SENSITIVE Sensitive      PIP/TAZO 8 SENSITIVE Sensitive     TOBRAMYCIN <=1 SENSITIVE Sensitive     * MODERATE PSEUDOMONAS AERUGINOSA  Culture, respiratory (NON-Expectorated)     Status: None   Collection Time: 05/17/15 12:25 PM  Result Value Ref Range Status   Specimen Description TRACHEAL ASPIRATE  Final   Special Requests Normal  Final   Gram Stain   Final    RARE WBC PRESENT, PREDOMINANTLY MONONUCLEAR RARE SQUAMOUS EPITHELIAL CELLS PRESENT NO ORGANISMS SEEN Performed at [facility]    Culture   Final    FEW PSEUDOMONAS AERUGINOSA Performed at [facility]    Report Status 05/20/2015 FINAL  Final   Organism ID, Bacteria PSEUDOMONAS AERUGINOSA  Final      Susceptibility   Pseudomonas aeruginosa - MIC*    CEFEPIME 2 SENSITIVE Sensitive     CEFTAZIDIME 4 SENSITIVE Sensitive     CIPROFLOXACIN <=0.25 SENSITIVE Sensitive     GENTAMICIN <=1 SENSITIVE Sensitive     IMIPENEM 1 SENSITIVE Sensitive     PIP/TAZO 8 SENSITIVE Sensitive     TOBRAMYCIN <=1 SENSITIVE Sensitive     * FEW PSEUDOMONAS AERUGINOSA  Anaerobic culture  Status: None   Collection Time: 05/18/15 12:30 PM  Result Value Ref Range Status   Specimen Description FLUID PERITONEAL  Final   Special Requests PT ON ERLAPENEM,MEROPENEM,ZOSYN  Final   Gram Stain   Final    FEW WBC PRESENT, PREDOMINANTLY PMN MODERATE GRAM POSITIVE RODS FEW GRAM POSITIVE COCCI IN PAIRS RARE GRAM NEGATIVE RODS    Culture   Final    MIXED ANAEROBIC FLORA PRESENT.  CALL LAB IF FURTHER IID REQUIRED.   Report Status 05/22/2015 FINAL  Final  Body fluid culture     Status: None   Collection Time: 05/18/15 12:30 PM  Result Value Ref Range Status   Specimen Description FLUID PERITONEAL  Final   Special Requests PT ON ERLAPENEM,MEROPENEM,ZOSYN  Final   Gram Stain   Final    FEW WBC PRESENT, PREDOMINANTLY PMN MODERATE GRAM POSITIVE RODS FEW GRAM POSITIVE COCCI IN PAIRS RARE GRAM NEGATIVE RODS    Culture   Final    RARE ESCHERICHIA COLI FEW  ENTEROCOCCUS SPECIES RARE PSEUDOMONAS AERUGINOSA    Report Status 05/21/2015 FINAL  Final   Organism ID, Bacteria ESCHERICHIA COLI  Final   Organism ID, Bacteria ENTEROCOCCUS SPECIES  Final   Organism ID, Bacteria PSEUDOMONAS AERUGINOSA  Final      Susceptibility   Escherichia coli - MIC*    AMPICILLIN <=2 SENSITIVE Sensitive     CEFAZOLIN <=4 SENSITIVE Sensitive     CEFEPIME <=1 SENSITIVE Sensitive     CEFTAZIDIME <=1 SENSITIVE Sensitive     CEFTRIAXONE <=1 SENSITIVE Sensitive     CIPROFLOXACIN <=0.25 SENSITIVE Sensitive     GENTAMICIN <=1 SENSITIVE Sensitive     IMIPENEM <=0.25 SENSITIVE Sensitive     TRIMETH/SULFA <=20 SENSITIVE Sensitive     AMPICILLIN/SULBACTAM <=2 SENSITIVE Sensitive     PIP/TAZO <=4 SENSITIVE Sensitive     * RARE ESCHERICHIA COLI   Pseudomonas aeruginosa - MIC*    CEFTAZIDIME 4 SENSITIVE Sensitive     CIPROFLOXACIN <=0.25 SENSITIVE Sensitive     GENTAMICIN <=1 SENSITIVE Sensitive     IMIPENEM 1 SENSITIVE Sensitive     PIP/TAZO 8 SENSITIVE Sensitive     CEFEPIME 4 SENSITIVE Sensitive     * RARE PSEUDOMONAS AERUGINOSA   Enterococcus species - MIC*    AMPICILLIN <=2 SENSITIVE Sensitive     VANCOMYCIN <=0.5 SENSITIVE Sensitive     GENTAMICIN SYNERGY SENSITIVE Sensitive     * FEW ENTEROCOCCUS SPECIES  Culture, blood (routine x 2)     Status: None   Collection Time: 05/25/15  4:19 AM  Result Value Ref Range Status   Specimen Description BLOOD LEFT ANTECUBITAL  Final   Special Requests IN PEDIATRIC BOTTLE 3CC  Final   Culture  Setup Time   Final    GRAM POSITIVE COCCI IN CLUSTERS AEROBIC BOTTLE ONLY CRITICAL RESULT CALLED TO, READ BACK BY AND VERIFIED WITH: KAREN @0208  05/26/15 MKELLY    Culture   Final    STAPHYLOCOCCUS SPECIES (COAGULASE NEGATIVE) THE SIGNIFICANCE OF ISOLATING THIS ORGANISM FROM A SINGLE SET OF BLOOD CULTURES WHEN MULTIPLE SETS ARE DRAWN IS UNCERTAIN. PLEASE NOTIFY THE MICROBIOLOGY DEPARTMENT WITHIN ONE WEEK IF SPECIATION AND  SENSITIVITIES ARE REQUIRED.    Report Status 05/27/2015 FINAL  Final  Culture, blood (routine x 2)     Status: None (Preliminary result)   Collection Time: 05/25/15  6:21 AM  Result Value Ref Range Status   Specimen Description BLOOD LEFT ANTECUBITAL  Final   Special Requests BOTTLES DRAWN  AEROBIC ONLY 5CCS  Final   Culture NO GROWTH 3 DAYS  Final   Report Status PENDING  Incomplete    Anti-infectives:  Anti-infectives    Start     Dose/Rate Route Frequency Ordered Stop   05/26/15 2000  vancomycin (VANCOCIN) IVPB 1000 mg/200 mL premix     1,000 mg 200 mL/hr over 60 Minutes Intravenous Every 8 hours 05/26/15 0755     05/26/15 0830  vancomycin (VANCOCIN) 1,500 mg in sodium chloride 0.9 % 500 mL IVPB     1,500 mg 250 mL/hr over 120 Minutes Intravenous  Once 05/26/15 0743 05/26/15 1021   05/26/15 0730  ertapenem (INVANZ) 1 g in sodium chloride 0.9 % 50 mL IVPB  Status:  Discontinued     1 g 100 mL/hr over 30 Minutes Intravenous Every 24 hours 05/26/15 0729 05/26/15 0741   05/20/15 1330  piperacillin-tazobactam (ZOSYN) IVPB 3.375 g     3.375 g 12.5 mL/hr over 240 Minutes Intravenous Every 8 hours 05/20/15 1321     05/17/15 1000  ertapenem (INVANZ) 1 g in sodium chloride 0.9 % 50 mL IVPB  Status:  Discontinued     1 g 100 mL/hr over 30 Minutes Intravenous Every 24 hours 05/17/15 0936 05/17/15 0951   05/17/15 1000  meropenem (MERREM) 1 g in sodium chloride 0.9 % 100 mL IVPB  Status:  Discontinued     1 g 200 mL/hr over 30 Minutes Intravenous 3 times per day 05/17/15 0951 05/20/15 1321   05/15/15 0815  piperacillin-tazobactam (ZOSYN) IVPB 3.375 g  Status:  Discontinued     3.375 g 12.5 mL/hr over 240 Minutes Intravenous 3 times per day 05/15/15 0804 05/17/15 0951   05/08/15 0200  ceFAZolin (ANCEF) IVPB 2 g/50 mL premix     2 g 100 mL/hr over 30 Minutes Intravenous 3 times per day 05/08/15 0140 05/09/15 2238   05/08/15 0130  ceFAZolin (ANCEF) powder 2 g  Status:  Discontinued     2 g  Other Every 8 hours 05/08/15 0129 05/08/15 0139      Best Practice/Protocols:  VTE Prophylaxis: Heparin (drip) and (therapeutic) and Mechanical GI Prophylaxis: Proton Pump Inhibitor Continous Sedation  Consults: Treatment Team:  Nada Libman, MD    Events:  Subjective:    Overnight Issues: Patient awakens.  No distress.  Objective:  Vital signs for last 24 hours: Temp:  [98.4 F (36.9 C)-101.4 F (38.6 C)] 99 F (37.2 C) (11/07 0400) Pulse Rate:  [73-103] 96 (11/07 0700) Resp:  [0-27] 22 (11/07 0700) BP: (106-136)/(61-94) 118/66 mmHg (11/07 0700) SpO2:  [99 %-100 %] 100 % (11/07 0742) FiO2 (%):  [40 %] 40 % (11/07 0742)  Hemodynamic parameters for last 24 hours: CVP:  [5 mmHg-10 mmHg] 9 mmHg  Intake/Output from previous day: 11/06 0701 - 11/07 0700 In: 5247.3 [I.V.:1749.4; IV Piggyback:500; TPN:2997.9] Out: 7840 [Urine:6620; Drains:870; Stool:190]  Intake/Output this shift:    Vent settings for last 24 hours: Vent Mode:  [-] PRVC FiO2 (%):  [40 %] 40 % Set Rate:  [16 bmp-19 bmp] 16 bmp Vt Set:  [550 mL] 550 mL PEEP:  [5 cmH20] 5 cmH20 Plateau Pressure:  [25 cmH20-31 cmH20] 31 cmH20  Physical Exam:  General: alert and no respiratory distress Neuro: alert, oriented and nonfocal exam Resp: clear to auscultation bilaterally CVS: regular rate and rhythm, S1, S2 normal, no murmur, click, rub or gallop GI: Open abdomen.  Ileostomy is firm, protruding, seems non-viable, bu t putting out enteric contents  constantly. Extremities: edema 4+ and unequal size  Results for orders placed or performed during the hospital encounter of 05/07/15 (from the past 24 hour(s))  CBC     Status: Abnormal   Collection Time: 05/29/15  5:49 AM  Result Value Ref Range   WBC 31.6 (H) 4.0 - 10.5 K/uL   RBC 2.92 (L) 4.22 - 5.81 MIL/uL   Hemoglobin 8.5 (L) 13.0 - 17.0 g/dL   HCT 16.125.8 (L) 09.639.0 - 04.552.0 %   MCV 88.4 78.0 - 100.0 fL   MCH 29.1 26.0 - 34.0 pg   MCHC 32.9 30.0 - 36.0  g/dL   RDW 40.915.6 (H) 81.111.5 - 91.415.5 %   Platelets 611 (H) 150 - 400 K/uL  Heparin level (unfractionated)     Status: None   Collection Time: 05/29/15  5:49 AM  Result Value Ref Range   Heparin Unfractionated 0.30 0.30 - 0.70 IU/mL  Comprehensive metabolic panel     Status: Abnormal   Collection Time: 05/29/15  5:49 AM  Result Value Ref Range   Sodium 133 (L) 135 - 145 mmol/L   Potassium 4.2 3.5 - 5.1 mmol/L   Chloride 100 (L) 101 - 111 mmol/L   CO2 28 22 - 32 mmol/L   Glucose, Bld 145 (H) 65 - 99 mg/dL   BUN 17 6 - 20 mg/dL   Creatinine, Ser 7.820.66 0.61 - 1.24 mg/dL   Calcium 7.6 (L) 8.9 - 10.3 mg/dL   Total Protein 6.0 (L) 6.5 - 8.1 g/dL   Albumin <9.5<1.0 (L) 3.5 - 5.0 g/dL   AST 67 (H) 15 - 41 U/L   ALT 128 (H) 17 - 63 U/L   Alkaline Phosphatase 107 38 - 126 U/L   Total Bilirubin 2.0 (H) 0.3 - 1.2 mg/dL   GFR calc non Af Amer >60 >60 mL/min   GFR calc Af Amer >60 >60 mL/min   Anion gap 5 5 - 15  Magnesium     Status: None   Collection Time: 05/29/15  5:49 AM  Result Value Ref Range   Magnesium 1.7 1.7 - 2.4 mg/dL  Phosphorus     Status: None   Collection Time: 05/29/15  5:49 AM  Result Value Ref Range   Phosphorus 3.9 2.5 - 4.6 mg/dL  Triglycerides     Status: Abnormal   Collection Time: 05/29/15  5:49 AM  Result Value Ref Range   Triglycerides 210 (H) <150 mg/dL     Assessment/Plan:   NEURO  Altered Mental Status:  sedation   Plan: Keep sedated for procedure today.  PULM  No known issues.  Trach in place.  Patient has not been weaned.   Plan: CPM  CARDIO  No issues   Plan: CPM  RENAL  Urine output and renal function is good.   Plan: CPM  GI  Open abdomen with special closure device in place.   Plan: Adjust the device today.  ID  Had positive blood cultures.  Pseudomonas infection.  On Zosyn and Vanco   Plan: CPM  HEME  Anemia acute blood loss anemia and anemia of critical illness)   Plan: No blood for now.  ENDO No specific issues   Plan: CPM  Global  Issues  Changed VAC dressing and closure device adjustment today.      LOS: 22 days   Additional comments:I reviewed the patient's new clinical lab test results. cbc/bmet and I reviewed the patients new imaging test results. CXR rom several days ago.  Critical  Care Total Time*: 30 Minutes  Ivyanna Sibert 05/29/2015  *Care during the described time interval was provided by me and/or other providers on the critical care team.  I have reviewed this patient's available data, including medical history, events of note, physical examination and test results as part of my evaluation.

## 2015-05-29 NOTE — Procedures (Signed)
OPERATIVE REPORT  DATE OF OPERATION:  05/29/2015  PATIENT:  Raymond Bowen  39 y.o. male  PRE-OPERATIVE DIAGNOSIS:  Open abdomen measuring 34 x 13 cm maximal width with dynamic closure device and negative pressure wound dressing in place  POST-OPERATIVE DIAGNOSIS:  Same with maximal length decreased to 10 cm  PROCEDURE:  Procedure(s): REAPPLICATION OF WOUND VAC ADJUSTMENT OF DYNAMIC CLOSURE DEVICE ASPIRATION OF ABDOMINAL FLUID  SURGEON:  Lindie SpruceWYATT, M.D.  Threasa HeadsASSISTANSheliah Hatch: Kinsinger, M.D.  ANESTHESIA:   IV sedation and paralytic  EBL: <20 ml  BLOOD ADMINISTERED: none  DRAINS: Nasogastric Tube, Urinary Catheter (Foley), (one) Blake drain(s) in the LUQ and NPWD   SPECIMEN:  No Specimen  COUNTS CORRECT:  YES  PROCEDURE DETAILS: The procedure was performed at the bedside in the [facility] intensive care [facility].  A proper timeout was performed identifying the patient and the procedure to be performed. The negative pressure wound dressing was detached from the machine. It was prepped with ChloraPrep on top of the plastic. We draped in the usual manner. The central portion of the negative pressure wound dressing was removed with entire sponge at the skin and fascial edge.  The myofascial release procedure was then performed for 30 seconds 3. We then adjusted the dynamic wound closure straps across the silicone tube in the peritoneal cavity after irrigating out aspirating peritoneal fluid. The fascial edges went from 13 cm with down to 10 cm. After adjustment of myofascial release was performed again and the maximal width remained at 10 cm.  A negative pressure wound dressing was reapplied. All counts were correct.  PATIENT DISPOSITION:  The patient remained in the intensive care unit in stable condition.   Raymond Bowen 11/7/201610:22 AM

## 2015-05-30 ENCOUNTER — Encounter (HOSPITAL_COMMUNITY): Payer: Self-pay | Admitting: General Surgery

## 2015-05-30 LAB — CBC
HCT: 24.6 % — ABNORMAL LOW (ref 39.0–52.0)
Hemoglobin: 8.1 g/dL — ABNORMAL LOW (ref 13.0–17.0)
MCH: 28.7 pg (ref 26.0–34.0)
MCHC: 32.9 g/dL (ref 30.0–36.0)
MCV: 87.2 fL (ref 78.0–100.0)
Platelets: 599 10*3/uL — ABNORMAL HIGH (ref 150–400)
RBC: 2.82 MIL/uL — ABNORMAL LOW (ref 4.22–5.81)
RDW: 15.6 % — ABNORMAL HIGH (ref 11.5–15.5)
WBC: 33.3 10*3/uL — ABNORMAL HIGH (ref 4.0–10.5)

## 2015-05-30 LAB — VANCOMYCIN, TROUGH: Vancomycin Tr: 13 ug/mL (ref 10.0–20.0)

## 2015-05-30 LAB — HEPARIN LEVEL (UNFRACTIONATED): Heparin Unfractionated: 0.61 IU/mL (ref 0.30–0.70)

## 2015-05-30 LAB — CULTURE, BLOOD (ROUTINE X 2): Culture: NO GROWTH

## 2015-05-30 MED ORDER — PIVOT 1.5 CAL PO LIQD
1000.0000 mL | ORAL | Status: DC
Start: 2015-05-30 — End: 2015-05-31
  Administered 2015-05-30: 1000 mL
  Filled 2015-05-30 (×2): qty 1000

## 2015-05-30 MED ORDER — SODIUM CHLORIDE 0.9 % IJ SOLN
10.0000 mL | INTRAMUSCULAR | Status: DC | PRN
  Administered 2015-06-20: 10 mL
  Administered 2015-06-28 – 2015-06-29 (×3): 30 mL
  Administered 2015-06-29: 40 mL
  Administered 2015-06-30: 10 mL
  Administered 2015-06-30 – 2015-07-01 (×2): 30 mL
  Administered 2015-07-03: 10 mL
  Filled 2015-05-30 (×9): qty 40

## 2015-05-30 MED ORDER — ACETAMINOPHEN 160 MG/5ML PO SOLN
650.0000 mg | ORAL | Status: DC | PRN
  Administered 2015-05-30 – 2015-06-23 (×3): 650 mg
  Filled 2015-05-30 (×4): qty 20.3

## 2015-05-30 MED ORDER — TRACE MINERALS CR-CU-MN-SE-ZN 10-1000-500-60 MCG/ML IV SOLN
INTRAVENOUS | Status: AC
  Administered 2015-05-30: 17:00:00 via INTRAVENOUS
  Filled 2015-05-30: qty 3000

## 2015-05-30 MED ORDER — SODIUM CHLORIDE 0.9 % IJ SOLN
10.0000 mL | Freq: Two times a day (BID) | INTRAMUSCULAR | Status: DC
  Administered 2015-05-30 (×2): 10 mL
  Administered 2015-05-31: 30 mL
  Administered 2015-05-31 – 2015-06-01 (×3): 10 mL
  Administered 2015-06-02: 40 mL
  Administered 2015-06-02 – 2015-06-24 (×35): 10 mL
  Administered 2015-06-25: 20 mL
  Administered 2015-06-25 – 2015-06-27 (×3): 10 mL
  Administered 2015-06-28: 20 mL
  Administered 2015-07-01 – 2015-07-02 (×3): 10 mL

## 2015-05-30 NOTE — Consult Note (Signed)
WOC ostomy follow up At bedside with trauma service for abdominal myofascial release move.   Stoma type/location: RLQ, end ileostomy Stomal assessment/size: 1 3/4" oval, necrotic mucosa, will probably slough, some pink mucosa at the lateral side of the stoma. Stoma is edematous, a bit hard. Above skin level Peristomal assessment: intact  Treatment options for stomal/peristomal skin: na Output liquid brown  Ostomy pouching: 2pc.2 1/4 used today. Carefully removed old pouch, it was under some of the ABRA dressing and the tape securing the wires. I was able to cut away just the wafer.  Cleansed the skin 2pc replaced, trimmed edges to allow for continued dressing changes for ABRA system.   Education provided: patient sedated, trach [place] [facility]: yes  WOC will follow along as needed for ostomy care and teaching when appropriate.   Letita Prentiss [place] RN, [place] [phone]
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
## 2015-05-30 NOTE — Procedures (Signed)
Procedure note  Charma IgoMichael Jeffery, [facility] and I performed the Delsa Bernbra "move" at the bedside. Bilateral abdominal wall massage was done for 30 seconds 3 times with rest time in between per guidelines for abdominal myofascial release. He tolerated this well with mild discomfort.  Violeta GelinasBurke Vashon Riordan, MD, MPH, FACS Trauma: [phone] General Surgery: [phone]

## 2015-05-30 NOTE — Progress Notes (Signed)
ANTIBIOTIC CONSULT NOTE - FOLLOW UP  Pharmacy Consult for Vancomycin Indication: pneumonia/sepsis?  Allergies  Allergen Reactions  . Shellfish Allergy Anaphylaxis      Assessment: 39 year old male continues on vancomycin (day #5) Vancomycin trough therapeutic, Scr stable Blood culture positive for GPC - appears to be coag negative staph. Peritoneal culture positive for a few enterococcus sensitive to ampicillin  Goal of Therapy:  Appropriate dosing of antibiotics  Plan:  Continue Vancomycin 1 gram iv Q 8 hours Add a stop date?  Thank you Okey RegalLisa Nichola Warren, PharmD [phone]  05/30/2015,8:10 PM
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
## 2015-05-31 LAB — BASIC METABOLIC PANEL
Anion gap: 7 (ref 5–15)
BUN: 16 mg/dL (ref 6–20)
CO2: 27 mmol/L (ref 22–32)
Calcium: 7.7 mg/dL — ABNORMAL LOW (ref 8.9–10.3)
Chloride: 97 mmol/L — ABNORMAL LOW (ref 101–111)
Creatinine, Ser: 0.69 mg/dL (ref 0.61–1.24)
GFR calc Af Amer: >60 mL/min (ref >60)
GFR calc non Af Amer: >60 mL/min (ref >60)
Glucose, Bld: 133 mg/dL — ABNORMAL HIGH (ref 65–99)
Potassium: 4 mmol/L (ref 3.5–5.1)
Sodium: 131 mmol/L — ABNORMAL LOW (ref 135–145)

## 2015-05-31 LAB — HEPARIN LEVEL (UNFRACTIONATED)
Heparin Unfractionated: 2.14 IU/mL — ABNORMAL HIGH (ref 0.30–0.70)
Heparin Unfractionated: 0.32 [IU]/mL (ref 0.30–0.70)

## 2015-05-31 LAB — CBC
HCT: 23.7 % — ABNORMAL LOW (ref 39.0–52.0)
Hemoglobin: 7.7 g/dL — ABNORMAL LOW (ref 13.0–17.0)
MCH: 28.4 pg (ref 26.0–34.0)
MCHC: 32.5 g/dL (ref 30.0–36.0)
MCV: 87.5 fL (ref 78.0–100.0)
Platelets: 616 10*3/uL — ABNORMAL HIGH (ref 150–400)
RBC: 2.71 MIL/uL — ABNORMAL LOW (ref 4.22–5.81)
RDW: 15.7 % — ABNORMAL HIGH (ref 11.5–15.5)
WBC: 35.1 10*3/uL — ABNORMAL HIGH (ref 4.0–10.5)

## 2015-05-31 MED ORDER — TRACE MINERALS CR-CU-MN-SE-ZN 10-1000-500-60 MCG/ML IV SOLN
INTRAVENOUS | Status: AC
  Administered 2015-05-31: 18:00:00 via INTRAVENOUS
  Filled 2015-05-31: qty 2640

## 2015-05-31 MED ORDER — VECURONIUM BROMIDE 10 MG IV SOLR
INTRAVENOUS | Status: AC
  Administered 2015-05-31: 10 mg
  Filled 2015-05-31: qty 10

## 2015-05-31 MED ORDER — PRO-STAT SUGAR FREE PO LIQD
30.0000 mL | Freq: Three times a day (TID) | ORAL | Status: DC
  Administered 2015-05-31 – 2015-06-01 (×6): 30 mL
  Filled 2015-05-31 (×7): qty 30

## 2015-05-31 MED ORDER — TRACE MINERALS CR-CU-MN-SE-ZN 10-1000-500-60 MCG/ML IV SOLN: INTRAVENOUS | Status: DC

## 2015-05-31 MED ORDER — PIVOT 1.5 CAL PO LIQD
1000.0000 mL | ORAL | Status: DC
  Administered 2015-05-31 – 2015-06-01 (×3): 1000 mL
  Filled 2015-05-31 (×8): qty 1000
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
## 2015-05-31 NOTE — Procedures (Signed)
OPERATIVE REPORT  DATE OF OPERATION:  05/31/2015  PATIENT:  Raymond Bowen  39 y.o. male  PRE-OPERATIVE DIAGNOSIS:  Open abdomen measuring 34 x 10 cm maximal width with dynamic closure device and negative pressure wound dressing in place  POST-OPERATIVE DIAGNOSIS:  Same with maximal length decreased to 7 cm  PROCEDURE:  Procedure(s): REAPPLICATION OF WOUND VAC ADJUSTMENT OF DYNAMIC CLOSURE DEVICE ASPIRATION OF ABDOMINAL FLUID  SURGEON:  Deziya Amero, M.D.  ASSISTANT: [place], NP-C  ANESTHESIA:   IV sedation and paralytic  EBL: <20 ml  BLOOD ADMINISTERED: none  DRAINS: Nasogastric Tube, Urinary Catheter (Foley), (one) Blake drain(s) in the LUQ and NPWD   SPECIMEN:  No Specimen  COUNTS CORRECT:  YES  PROCEDURE DETAILS: The procedure was performed at the bedside in the 3 mid [facility] intensive care unit.  A proper timeout was performed identifying the patient and the procedure to be performed. The negative pressure wound dressing was detached from the machine. It was prepped with ChloraPrep on top of the plastic. We draped in the usual manner. The central portion of the negative pressure wound dressing was removed with entire sponge at the skin and fascial edge.  The myofascial release procedure was then performed for 30 seconds 3. We then adjusted the dynamic wound closure straps across the silicone tube in the peritoneal cavity after irrigating out aspirating peritoneal fluid. The fascial edges went from 10 cm with down to 7 cm. After adjustment of myofascial release was performed again and the maximal width remained at 7 cm.  A negative pressure wound dressing was reapplied. All counts were correct. Inter-Dry dressing was applied around the buttons  PATIENT DISPOSITION:  The patient remained in the intensive care unit in stable condition.
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
## 2015-05-31 NOTE — Progress Notes (Signed)
SLP Cancellation Note  Patient Details Name: Raymond Bowen MRN: [id number] DOB: 05/03/1976   Cancelled treatment:       Reason Eval/Treat Not Completed: Fatigue/lethargy limiting ability to participate. Per RN, patient will remain heavily sedated today for procedure. Will f/u 11/10.   Ferdinand LangoLeah Vernis Eid MA, CCC-SLP [phone]    Raymond Bowen 05/31/2015, 10:05 AM

## 2015-05-31 NOTE — Progress Notes (Signed)
Sutures removed around trach.  MD aware.  No complications noted.

## 2015-05-31 NOTE — Progress Notes (Signed)
Dr. Lindie SpruceWyatt at bedside changing dressing and performing move and adjustment to abra closure device. Ileostomy pouch dressing also changed.

## 2015-06-01 LAB — CBC
HCT: 22 % — ABNORMAL LOW (ref 39.0–52.0)
Hemoglobin: 7.5 g/dL — ABNORMAL LOW (ref 13.0–17.0)
MCH: 29.4 pg (ref 26.0–34.0)
MCHC: 34.1 g/dL (ref 30.0–36.0)
MCV: 86.3 fL (ref 78.0–100.0)
Platelets: 617 10*3/uL — ABNORMAL HIGH (ref 150–400)
RBC: 2.55 MIL/uL — ABNORMAL LOW (ref 4.22–5.81)
RDW: 15.8 % — ABNORMAL HIGH (ref 11.5–15.5)
WBC: 39 10*3/uL — ABNORMAL HIGH (ref 4.0–10.5)

## 2015-06-01 LAB — COMPREHENSIVE METABOLIC PANEL
ALT: 152 U/L — ABNORMAL HIGH (ref 17–63)
AST: 103 U/L — ABNORMAL HIGH (ref 15–41)
Albumin: <1 g/dL — ABNORMAL LOW (ref 3.5–5.0)
Alkaline Phosphatase: 99 U/L (ref 38–126)
Anion gap: 6 (ref 5–15)
BUN: 19 mg/dL (ref 6–20)
CO2: 28 mmol/L (ref 22–32)
Calcium: 7.8 mg/dL — ABNORMAL LOW (ref 8.9–10.3)
Chloride: 98 mmol/L — ABNORMAL LOW (ref 101–111)
Creatinine, Ser: 0.7 mg/dL (ref 0.61–1.24)
GFR calc Af Amer: >60 mL/min (ref >60)
GFR calc non Af Amer: >60 mL/min (ref >60)
Glucose, Bld: 139 mg/dL — ABNORMAL HIGH (ref 65–99)
Potassium: 4.2 mmol/L (ref 3.5–5.1)
Sodium: 132 mmol/L — ABNORMAL LOW (ref 135–145)
Total Bilirubin: 2.2 mg/dL — ABNORMAL HIGH (ref 0.3–1.2)
Total Protein: 6 g/dL — ABNORMAL LOW (ref 6.5–8.1)

## 2015-06-01 LAB — HEPARIN LEVEL (UNFRACTIONATED): Heparin Unfractionated: 0.32 IU/mL (ref 0.30–0.70)

## 2015-06-01 LAB — PHOSPHORUS: Phosphorus: 3.9 mg/dL (ref 2.5–4.6)

## 2015-06-01 LAB — TRIGLYCERIDES: Triglycerides: 222 mg/dL — ABNORMAL HIGH (ref <150)

## 2015-06-01 LAB — MAGNESIUM: Magnesium: 2 mg/dL (ref 1.7–2.4)

## 2015-06-01 NOTE — Procedures (Signed)
Procedure note  Charma IgoMichael Jeffery, [facility] and I performed the Delsa Bernbra "move" at the bedside. Bilateral abdominal wall massage was done for 30 seconds 3 times with rest time in between per guidelines for abdominal myofascial release. He tolerated this well with mild discomfort. Elastamer failure L side 3rd from bottom. [facility]. His fascial separation is reducing gradually.  Violeta GelinasBurke Mahari Strahm, MD, MPH, FACS Trauma: [phone] General Surgery: [phone]

## 2015-06-01 NOTE — Progress Notes (Signed)
SLP Cancellation Note  Patient Details Name: Raymond Bowen MRN: [id number] DOB: 1975-08-31   Cancelled treatment:       Reason Eval/Treat Not Completed: Fatigue/lethargy limiting ability to participate  - after discussion with RN, determined sedation remains prohibitive for inline valve trial today - will follow and increase frequency of trials as appropriate.   Blenda MountsCouture, Raymond Bowen 06/01/2015, 12:04 PM
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
## 2015-06-02 ENCOUNTER — Inpatient Hospital Stay (HOSPITAL_COMMUNITY): Payer: No Typology Code available for payment source

## 2015-06-02 LAB — CBC
HCT: 22.3 % — ABNORMAL LOW (ref 39.0–52.0)
Hemoglobin: 7.1 g/dL — ABNORMAL LOW (ref 13.0–17.0)
MCH: 27.7 pg (ref 26.0–34.0)
MCHC: 31.8 g/dL (ref 30.0–36.0)
MCV: 87.1 fL (ref 78.0–100.0)
Platelets: 663 10*3/uL — ABNORMAL HIGH (ref 150–400)
RBC: 2.56 MIL/uL — ABNORMAL LOW (ref 4.22–5.81)
RDW: 16 % — ABNORMAL HIGH (ref 11.5–15.5)
WBC: 34.2 10*3/uL — ABNORMAL HIGH (ref 4.0–10.5)

## 2015-06-02 LAB — BASIC METABOLIC PANEL
Anion gap: 7 (ref 5–15)
BUN: 22 mg/dL — ABNORMAL HIGH (ref 6–20)
CO2: 29 mmol/L (ref 22–32)
Calcium: 8.1 mg/dL — ABNORMAL LOW (ref 8.9–10.3)
Chloride: 99 mmol/L — ABNORMAL LOW (ref 101–111)
Creatinine, Ser: 0.77 mg/dL (ref 0.61–1.24)
GFR calc Af Amer: >60 mL/min (ref >60)
GFR calc non Af Amer: >60 mL/min (ref >60)
Glucose, Bld: 111 mg/dL — ABNORMAL HIGH (ref 65–99)
Potassium: 4.7 mmol/L (ref 3.5–5.1)
Sodium: 135 mmol/L (ref 135–145)

## 2015-06-02 LAB — URINALYSIS, ROUTINE W REFLEX MICROSCOPIC
Glucose, UA: NEGATIVE mg/dL
Ketones, ur: NEGATIVE mg/dL
Nitrite: NEGATIVE
Protein, ur: 30 mg/dL — AB
Specific Gravity, Urine: 1.026 (ref 1.005–1.030)
Urobilinogen, UA: 0.2 mg/dL (ref 0.0–1.0)
pH: 5.5 (ref 5.0–8.0)

## 2015-06-02 LAB — C DIFFICILE QUICK SCREEN W PCR REFLEX
C Diff antigen: NEGATIVE
C Diff interpretation: NEGATIVE
C Diff toxin: NEGATIVE

## 2015-06-02 LAB — URINE MICROSCOPIC-ADD ON

## 2015-06-02 LAB — HEPARIN LEVEL (UNFRACTIONATED)
Heparin Unfractionated: 0.19 IU/mL — ABNORMAL LOW (ref 0.30–0.70)
Heparin Unfractionated: 0.27 IU/mL — ABNORMAL LOW (ref 0.30–0.70)
Heparin Unfractionated: 0.55 IU/mL (ref 0.30–0.70)

## 2015-06-02 MED ORDER — VECURONIUM BROMIDE 10 MG IV SOLR
INTRAVENOUS | Status: AC
  Administered 2015-06-02: 10 mg via INTRAVENOUS
  Filled 2015-06-02: qty 10

## 2015-06-02 MED ORDER — SODIUM CHLORIDE 0.45 % IV SOLN
INTRAVENOUS | Status: AC
  Administered 2015-06-02 – 2015-06-03 (×2): via INTRAVENOUS

## 2015-06-02 MED ORDER — SODIUM CHLORIDE 0.9 % IV SOLN
1.0000 g | INTRAVENOUS | Status: DC
  Administered 2015-06-02 – 2015-06-05 (×4): 1 g via INTRAVENOUS
  Filled 2015-06-02 (×5): qty 1

## 2015-06-02 MED ORDER — VECURONIUM BROMIDE 10 MG IV SOLR
INTRAVENOUS | Status: AC
  Administered 2015-06-02: 10 mg
  Filled 2015-06-02: qty 10
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
## 2015-06-02 NOTE — Clinical Documentation Improvement (Signed)
Trauma  Can the diagnosis of Malnutrition be further specified?   Document Severity - Severe(third degree), Moderate (second degree), Mild (first degree)  Other condition  Unable to clinically determine  Please exercise your independent, professional judgment when responding. A specific answer is not anticipated or expected.   Thank You, Cherylann Ratelonna J Raimundo Corbit, RN, BSN Health Information Management [facility] [phone]

## 2015-06-02 NOTE — Clinical Documentation Improvement (Signed)
Trauma  Abnormal Lab/Test Results:  05/31/15: Na= 131; 06/01/15: Na= 132  Possible Clinical Conditions associated with below indicators  Hyponatremia  Other Condition  Cannot Clinically Determine   Please exercise your independent, professional judgment when responding. A specific answer is not anticipated or expected.   Thank You,  Cherylann Ratelonna J Cornelious Diven, RN, BSN Health Information Management [place] [phone]

## 2015-06-02 NOTE — Progress Notes (Signed)
06/02/15  Pharmacy- TPN 1633   39yo male known to TPN service, as previously receiving (last bag 11/9).  He has been on TF at goal rate.  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   Plan: Begin TPN on 11/12. Add Mg and Phos to AM labs TPN labs qMon/Thurs   Marisue HumbleKendra Najee Manninen, PharmD Clinical Pharmacist [facility]

## 2015-06-02 NOTE — Progress Notes (Signed)
Trauma notified of output from ileostomy increasing.  States will continue to monitor and reassess during rounds.  Will continue to monitor.    Bosie HelperHandy, Rithik Odea Noel 06/02/2015 0400

## 2015-06-02 NOTE — Procedures (Signed)
DATE OF OPERATION: 05/31/2015  PATIENT: Raymond Bowen 39 y.o. 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  ASSISTANBonner Puna: [place], NP-C  ANESTHESIA: IV sedation and paralytic  EBL: <20 ml  BLOOD ADMINISTERED: none  DRAINS: Nasogastric Tube, Urinary Catheter (Foley), (one) Blake drain(s) in the LUQ and NPWD .  New Penrose drain attached by 3-0 Nylon through the stoma site into large opening into the parastomal subcutaneous tissue SPECIMEN: No Specimen  COUNTS CORRECT: YES  PROCEDURE DETAILS: The procedure was performed at the bedside in the [facility] [place] [facility].  Prior to the [facility], myofascial release and the readjustment of the tension straps, the surgeon removed the ostomy device and digitally explored the ostomy and found there to be a large opening between the distal end of the ileostomy the proximal part just coming through the fascia.  This fistulous connection was demonstrated on CT scan done for fever and abdominal pain.  A 1/2" width penrose drain was place in the pocket and sutured in place with a 3-0 Nylon.  A proper timeout was performed identifying the patient and the procedure to be performed. The negative pressure wound dressing was detached from the machine. It was prepped with ChloraPrep on top of the plastic. We draped in the usual manner. The central portion of the negative pressure wound dressing was removed with entire sponge at the skin and fascial edge.  The myofascial release procedure was then performed for 30 seconds 3. We then adjusted  the dynamic wound closure straps across the silicone tube in the peritoneal cavity after irrigating out aspirating peritoneal fluid. The fascial edges went from 10 cm with down to 7 cm. After adjustment of myofascial release was performed again and the maximal width remained at 7 cm.  A negative pressure wound dressing was reapplied. All counts were correct. Inter-Dry dressing was applied around the buttons  PATIENT DISPOSITION: The patient remained in the intensive care unit in stable condition.

## 2015-06-02 NOTE — Progress Notes (Signed)
Patient transported to CT and back to room 3M14 without any complications.

## 2015-06-03 DIAGNOSIS — L8990 Pressure ulcer of unspecified site, unspecified stage: Secondary | ICD-10-CM | POA: Diagnosis not present

## 2015-06-03 LAB — BASIC METABOLIC PANEL
Anion gap: 11 (ref 5–15)
BUN: 20 mg/dL (ref 6–20)
CO2: 28 mmol/L (ref 22–32)
Calcium: 7.9 mg/dL — ABNORMAL LOW (ref 8.9–10.3)
Chloride: 96 mmol/L — ABNORMAL LOW (ref 101–111)
Creatinine, Ser: 0.81 mg/dL (ref 0.61–1.24)
GFR calc Af Amer: >60 mL/min (ref >60)
GFR calc non Af Amer: >60 mL/min (ref >60)
Glucose, Bld: 89 mg/dL (ref 65–99)
Potassium: 4.4 mmol/L (ref 3.5–5.1)
Sodium: 135 mmol/L (ref 135–145)

## 2015-06-03 LAB — MAGNESIUM: Magnesium: 2 mg/dL (ref 1.7–2.4)

## 2015-06-03 LAB — CBC
HCT: 21.5 % — ABNORMAL LOW (ref 39.0–52.0)
Hemoglobin: 7 g/dL — ABNORMAL LOW (ref 13.0–17.0)
MCH: 28.1 pg (ref 26.0–34.0)
MCHC: 32.6 g/dL (ref 30.0–36.0)
MCV: 86.3 fL (ref 78.0–100.0)
Platelets: 623 10*3/uL — ABNORMAL HIGH (ref 150–400)
RBC: 2.49 MIL/uL — ABNORMAL LOW (ref 4.22–5.81)
RDW: 15.7 % — ABNORMAL HIGH (ref 11.5–15.5)
WBC: 34.7 10*3/uL — ABNORMAL HIGH (ref 4.0–10.5)

## 2015-06-03 LAB — URINE CULTURE: Culture: NO GROWTH

## 2015-06-03 LAB — PHOSPHORUS: Phosphorus: 5 mg/dL — ABNORMAL HIGH (ref 2.5–4.6)

## 2015-06-03 LAB — GLUCOSE, CAPILLARY
Glucose-Capillary: 87 mg/dL (ref 65–99)
Glucose-Capillary: 98 mg/dL (ref 65–99)

## 2015-06-03 LAB — HEPARIN LEVEL (UNFRACTIONATED): Heparin Unfractionated: 0.57 IU/mL (ref 0.30–0.70)

## 2015-06-03 MED ORDER — PANTOPRAZOLE SODIUM 40 MG PO PACK
40.0000 mg | PACK | Freq: Every day | ORAL | Status: DC
  Administered 2015-06-04 – 2015-06-18 (×14): 40 mg
  Filled 2015-06-03 (×15): qty 20

## 2015-06-03 MED ORDER — VITAL HIGH PROTEIN PO LIQD
1000.0000 mL | ORAL | Status: DC
  Filled 2015-06-03 (×2): qty 1000

## 2015-06-03 MED ORDER — TRACE MINERALS CR-CU-MN-SE-ZN 10-1000-500-60 MCG/ML IV SOLN
INTRAVENOUS | Status: AC
  Administered 2015-06-03: 18:00:00 via INTRAVENOUS
  Filled 2015-06-03: qty 3000

## 2015-06-03 MED ORDER — PIVOT 1.5 CAL PO LIQD
1000.0000 mL | ORAL | Status: DC
  Administered 2015-06-03 – 2015-06-07 (×2): 1000 mL
  Filled 2015-06-03 (×4): qty 1000
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
## 2015-06-03 NOTE — Procedures (Signed)
Procedure note  Will Jennings, PAC, and I performed the Abra myofascial release massage technique at the bedside including 3 X 30 second manipulations. VAC remains in place.  Ayisha Pol, MD, MPH, FACS Trauma: [phone] General Surgery: [phone] 
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
## 2015-06-03 NOTE — Progress Notes (Signed)
ANTICOAGULATION CONSULT NOTE - Follow Up Consult  Pharmacy Consult for Heparin  Indication: DVT  Allergies  Allergen Reactions  . Shellfish Allergy Anaphylaxis    Patient Measurements: Height: 5\' 10"  (177.8 cm) Weight: 228 lb 9.9 oz (103.7 kg) IBW/kg (Calculated) : 73  Vital Signs: Temp: 100.6 F (38.1 C) (11/11 2354) Temp Source: Oral (11/11 2354) BP: 113/63 mmHg (11/11 2343) Pulse Rate: 96 (11/11 2343)  Labs:  Recent Labs  05/31/15 0755  06/01/15 0500  06/02/15 0540 06/02/15 1335 06/02/15 2219  HGB 7.7*  --  7.5*  --  7.1*  --   --   HCT 23.7*  --  22.0*  --  22.3*  --   --   PLT 616*  --  617*  --  663*  --   --   HEPARINUNFRC  --   < >  --   < > 0.19* 0.27* 0.55  CREATININE 0.69  --  0.70  --  0.77  --   --   < > = values in this interval not displayed.  Estimated Creatinine Clearance: 152.5 mL/min (by C-G formula based on Cr of 0.77).   Assessment: Therapeutic heparin level after rate increase  Goal of Therapy:  Heparin level 0.3-0.7 units/ml Monitor platelets by anticoagulation protocol: Yes   Plan:  -Continue heparin at 3400 units/hr -Confirmatory HL with AM labs  Abran DukeLedford, Lener Ventresca 06/03/2015,12:36 AM

## 2015-06-03 NOTE — Progress Notes (Signed)
ANTICOAGULATION CONSULT NOTE - Follow Up Consult  Pharmacy Consult for Heparin  Indication: DVT  Allergies  Allergen Reactions  . Shellfish Allergy Anaphylaxis    Patient Measurements: Height: 5\' 10"  (177.8 cm) Weight: 228 lb 9.9 oz (103.7 kg) IBW/kg (Calculated) : 73  Vital Signs: Temp: 100.1 F (37.8 C) (11/12 1140) Temp Source: Axillary (11/12 1140) BP: 118/70 mmHg (11/12 1200) Pulse Rate: 87 (11/12 1200)  Labs:  Recent Labs  06/01/15 0500  06/02/15 0540 06/02/15 1335 06/02/15 2219 06/03/15 0322  HGB 7.5*  --  7.1*  --   --  7.0*  HCT 22.0*  --  22.3*  --   --  21.5*  PLT 617*  --  663*  --   --  623*  HEPARINUNFRC  --   < > 0.19* 0.27* 0.55 0.57  CREATININE 0.70  --  0.77  --   --  0.81  < > = values in this interval not displayed.  Estimated Creatinine Clearance: 150.6 mL/min (by C-G formula based on Cr of 0.81).   Assessment: Confirmation level of heparin is therapeutic again this AM.   Goal of Therapy:  Heparin level 0.3-0.7 units/ml Monitor platelets by anticoagulation protocol: Yes   Plan:  -Continue heparin at 3400 units/hr  Ulyses SouthwardMinh Pham, PharmD Pager: [phone] 06/03/2015 2:35 PM

## 2015-06-04 LAB — GLUCOSE, CAPILLARY
Glucose-Capillary: 104 mg/dL — ABNORMAL HIGH (ref 65–99)
Glucose-Capillary: 111 mg/dL — ABNORMAL HIGH (ref 65–99)
Glucose-Capillary: 135 mg/dL — ABNORMAL HIGH (ref 65–99)
Glucose-Capillary: 138 mg/dL — ABNORMAL HIGH (ref 65–99)
Glucose-Capillary: 146 mg/dL — ABNORMAL HIGH (ref 65–99)
Glucose-Capillary: 99 mg/dL (ref 65–99)

## 2015-06-04 LAB — HEPARIN LEVEL (UNFRACTIONATED)
Heparin Unfractionated: 0.15 IU/mL — ABNORMAL LOW (ref 0.30–0.70)
Heparin Unfractionated: 1.7 IU/mL — ABNORMAL HIGH (ref 0.30–0.70)
Heparin Unfractionated: 0.27 IU/mL — ABNORMAL LOW (ref 0.30–0.70)
Heparin Unfractionated: 0.74 IU/mL — ABNORMAL HIGH (ref 0.30–0.70)

## 2015-06-04 LAB — CBC
HCT: 23.3 % — ABNORMAL LOW (ref 39.0–52.0)
Hemoglobin: 7.6 g/dL — ABNORMAL LOW (ref 13.0–17.0)
MCH: 28.4 pg (ref 26.0–34.0)
MCHC: 32.6 g/dL (ref 30.0–36.0)
MCV: 86.9 fL (ref 78.0–100.0)
Platelets: 695 10*3/uL — ABNORMAL HIGH (ref 150–400)
RBC: 2.68 MIL/uL — ABNORMAL LOW (ref 4.22–5.81)
RDW: 16.1 % — ABNORMAL HIGH (ref 11.5–15.5)
WBC: 27.9 10*3/uL — ABNORMAL HIGH (ref 4.0–10.5)

## 2015-06-04 LAB — BASIC METABOLIC PANEL
Anion gap: 11 (ref 5–15)
BUN: 22 mg/dL — ABNORMAL HIGH (ref 6–20)
CO2: 27 mmol/L (ref 22–32)
Calcium: 8 mg/dL — ABNORMAL LOW (ref 8.9–10.3)
Chloride: 96 mmol/L — ABNORMAL LOW (ref 101–111)
Creatinine, Ser: 0.78 mg/dL (ref 0.61–1.24)
GFR calc Af Amer: >60 mL/min (ref >60)
GFR calc non Af Amer: >60 mL/min (ref >60)
Glucose, Bld: 130 mg/dL — ABNORMAL HIGH (ref 65–99)
Potassium: 3.7 mmol/L (ref 3.5–5.1)
Sodium: 134 mmol/L — ABNORMAL LOW (ref 135–145)

## 2015-06-04 LAB — PHOSPHORUS: Phosphorus: 4.4 mg/dL (ref 2.5–4.6)

## 2015-06-04 LAB — MAGNESIUM: Magnesium: 1.9 mg/dL (ref 1.7–2.4)

## 2015-06-04 MED ORDER — MAGNESIUM SULFATE IN D5W 10-5 MG/ML-% IV SOLN
1.0000 g | Freq: Once | INTRAVENOUS | Status: AC
  Administered 2015-06-04: 1 g via INTRAVENOUS
  Filled 2015-06-04: qty 100

## 2015-06-04 MED ORDER — HEPARIN (PORCINE) IN NACL 100-0.45 UNIT/ML-% IJ SOLN
3400.0000 [IU]/h | INTRAMUSCULAR | Status: DC
  Administered 2015-06-04: 4000 [IU]/h via INTRAVENOUS
  Administered 2015-06-05: 3900 [IU]/h via INTRAVENOUS
  Administered 2015-06-05: 3700 [IU]/h via INTRAVENOUS
  Administered 2015-06-05: 3900 [IU]/h via INTRAVENOUS
  Administered 2015-06-06 (×2): 3400 [IU]/h via INTRAVENOUS
  Filled 2015-06-04 (×14): qty 250

## 2015-06-04 MED ORDER — TRACE MINERALS CR-CU-MN-SE-ZN 10-1000-500-60 MCG/ML IV SOLN
INTRAVENOUS | Status: AC
  Administered 2015-06-04: 17:00:00 via INTRAVENOUS
  Filled 2015-06-04: qty 2640

## 2015-06-04 MED ORDER — DIPHENOXYLATE-ATROPINE 2.5-0.025 MG/5ML PO LIQD
5.0000 mL | Freq: Four times a day (QID) | ORAL | Status: DC
  Administered 2015-06-04 – 2015-06-12 (×29): 5 mL via ORAL
  Filled 2015-06-04 (×29): qty 5

## 2015-06-04 NOTE — Progress Notes (Signed)
ANTICOAGULATION CONSULT NOTE - Follow Up Consult  Pharmacy Consult for Heparin  Indication: DVT  Allergies  Allergen Reactions  . Shellfish Allergy Anaphylaxis    Patient Measurements: Height: 5\' 10"  (177.8 cm) Weight: 206 lb 2.1 oz (93.5 kg) IBW/kg (Calculated) : 73  Vital Signs: Temp: 102.3 F (39.1 C) (11/13 1153) Temp Source: Axillary (11/13 1153) BP: 120/67 mmHg (11/13 1400) Pulse Rate: 101 (11/13 1430)  Labs:  Recent Labs  06/02/15 0540  06/03/15 0322 06/04/15 0237 06/04/15 1200 06/04/15 1515  HGB 7.1*  --  7.0* 7.6*  --   --   HCT 22.3*  --  21.5* 23.3*  --   --   PLT 663*  --  623* 695*  --   --   HEPARINUNFRC 0.19*  < > 0.57 0.15* 1.70* 0.27*  CREATININE 0.77  --  0.81 0.78  --   --   < > = values in this interval not displayed.  Estimated Creatinine Clearance: 145.2 mL/min (by C-G formula based on Cr of 0.78).   Assessment: Level is subtherapeutic again today despite running at a very high rate. Going to increase it again. May have to run 2 drips to get to this rate.   Goal of Therapy:  Heparin level 0.3-0.7 units/ml Monitor platelets by anticoagulation protocol: Yes   Plan:   Increase heparin to 4000 units/hr Check 6 hr HL  Ulyses SouthwardMinh Pham, PharmD Pager: [phone] 06/04/2015 4:01 PM
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
## 2015-06-04 NOTE — Progress Notes (Signed)
ANTICOAGULATION CONSULT NOTE - Follow Up Consult  Pharmacy Consult for Heparin  Indication: DVT  Allergies  Allergen Reactions  . Shellfish Allergy Anaphylaxis    Patient Measurements: Height: 5\' 10"  (177.8 cm) Weight: 206 lb 2.1 oz (93.5 kg) IBW/kg (Calculated) : 73  Vital Signs: Temp: 101.9 F (38.8 C) (11/13 0421) Temp Source: Axillary (11/13 0421) BP: 109/59 mmHg (11/13 0500) Pulse Rate: 89 (11/13 0500)  Labs:  Recent Labs  06/02/15 0540  06/02/15 2219 06/03/15 0322 06/04/15 0237  HGB 7.1*  --   --  7.0* 7.6*  HCT 22.3*  --   --  21.5* 23.3*  PLT 663*  --   --  623* 695*  HEPARINUNFRC 0.19*  < > 0.55 0.57 0.15*  CREATININE 0.77  --   --  0.81 0.78  < > = values in this interval not displayed.  Estimated Creatinine Clearance: 145.2 mL/min (by C-G formula based on Cr of 0.78).   Assessment: Sub-therapeutic heparin level this AM, no issues per RN.   Goal of Therapy:  Heparin level 0.3-0.7 units/ml Monitor platelets by anticoagulation protocol: Yes   Plan:  -Increase heparin to 3700 units/hr -1200 HL  Raymond Bowen 06/04/2015,5:12 AM
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
## 2015-06-04 NOTE — Progress Notes (Signed)
ANTICOAGULATION CONSULT NOTE - Follow Up Consult  Pharmacy Consult for Heparin  Indication: DVT  Allergies  Allergen Reactions  . Shellfish Allergy Anaphylaxis   Patient Measurements: Height: 5\' 10"  (177.8 cm) Weight: 206 lb 2.1 oz (93.5 kg) IBW/kg (Calculated) : 73  Vital Signs: Temp: 98.3 F (36.8 C) (11/13 2000) Temp Source: Axillary (11/13 2000) BP: 118/87 mmHg (11/13 2302) Pulse Rate: 95 (11/13 2100)  Labs:  Recent Labs  06/02/15 0540  06/03/15 0322 06/04/15 0237 06/04/15 1200 06/04/15 1515 06/04/15 2200  HGB 7.1*  --  7.0* 7.6*  --   --   --   HCT 22.3*  --  21.5* 23.3*  --   --   --   PLT 663*  --  623* 695*  --   --   --   HEPARINUNFRC 0.19*  < > 0.57 0.15* 1.70* 0.27* 0.74*  CREATININE 0.77  --  0.81 0.78  --   --   --   < > = values in this interval not displayed.  Estimated Creatinine Clearance: 145.2 mL/min (by C-G formula based on Cr of 0.78).  Assessment: Elevated heparin level, no issues per RN.   Goal of Therapy:  Heparin level 0.3-0.7 units/ml Monitor platelets by anticoagulation protocol: Yes   Plan:  -Decrease heparin to 3900 units/hr -0600 HL  Abran DukeLedford, Jadynn Epping 06/04/2015,11:29 PM

## 2015-06-05 LAB — COMPREHENSIVE METABOLIC PANEL
ALT: 314 U/L — ABNORMAL HIGH (ref 17–63)
AST: 192 U/L — ABNORMAL HIGH (ref 15–41)
Albumin: 1.1 g/dL — ABNORMAL LOW (ref 3.5–5.0)
Alkaline Phosphatase: 121 U/L (ref 38–126)
Anion gap: 7 (ref 5–15)
BUN: 16 mg/dL (ref 6–20)
CO2: 29 mmol/L (ref 22–32)
Calcium: 8.2 mg/dL — ABNORMAL LOW (ref 8.9–10.3)
Chloride: 96 mmol/L — ABNORMAL LOW (ref 101–111)
Creatinine, Ser: 0.64 mg/dL (ref 0.61–1.24)
GFR calc Af Amer: >60 mL/min (ref >60)
GFR calc non Af Amer: >60 mL/min (ref >60)
Glucose, Bld: 130 mg/dL — ABNORMAL HIGH (ref 65–99)
Potassium: 4.3 mmol/L (ref 3.5–5.1)
Sodium: 132 mmol/L — ABNORMAL LOW (ref 135–145)
Total Bilirubin: 2.4 mg/dL — ABNORMAL HIGH (ref 0.3–1.2)
Total Protein: 7.3 g/dL (ref 6.5–8.1)

## 2015-06-05 LAB — DIFFERENTIAL
Basophils Absolute: 0 10*3/uL (ref 0.0–0.1)
Basophils Relative: 0 %
Eosinophils Absolute: 0.6 10*3/uL (ref 0.0–0.7)
Eosinophils Relative: 2 %
Lymphocytes Relative: 10 %
Lymphs Abs: 3 10*3/uL (ref 0.7–4.0)
Monocytes Absolute: 3 10*3/uL — ABNORMAL HIGH (ref 0.1–1.0)
Monocytes Relative: 10 %
Neutro Abs: 23.1 10*3/uL — ABNORMAL HIGH (ref 1.7–7.7)
Neutrophils Relative %: 78 %

## 2015-06-05 LAB — GLUCOSE, CAPILLARY
Glucose-Capillary: 100 mg/dL — ABNORMAL HIGH (ref 65–99)
Glucose-Capillary: 110 mg/dL — ABNORMAL HIGH (ref 65–99)
Glucose-Capillary: 129 mg/dL — ABNORMAL HIGH (ref 65–99)
Glucose-Capillary: 136 mg/dL — ABNORMAL HIGH (ref 65–99)
Glucose-Capillary: 121 mg/dL — ABNORMAL HIGH (ref 65–99)

## 2015-06-05 LAB — TRIGLYCERIDES: Triglycerides: 234 mg/dL — ABNORMAL HIGH (ref <150)

## 2015-06-05 LAB — CBC
HCT: 22 % — ABNORMAL LOW (ref 39.0–52.0)
Hemoglobin: 7.1 g/dL — ABNORMAL LOW (ref 13.0–17.0)
MCH: 27.8 pg (ref 26.0–34.0)
MCHC: 32.3 g/dL (ref 30.0–36.0)
MCV: 86.3 fL (ref 78.0–100.0)
Platelets: 708 10*3/uL — ABNORMAL HIGH (ref 150–400)
RBC: 2.55 MIL/uL — ABNORMAL LOW (ref 4.22–5.81)
RDW: 16.2 % — ABNORMAL HIGH (ref 11.5–15.5)
WBC: 29.7 10*3/uL — ABNORMAL HIGH (ref 4.0–10.5)

## 2015-06-05 LAB — HEPARIN LEVEL (UNFRACTIONATED)
Heparin Unfractionated: 0.8 IU/mL — ABNORMAL HIGH (ref 0.30–0.70)
Heparin Unfractionated: 0.79 IU/mL — ABNORMAL HIGH (ref 0.30–0.70)
Heparin Unfractionated: 0.86 IU/mL — ABNORMAL HIGH (ref 0.30–0.70)

## 2015-06-05 LAB — PREALBUMIN: Prealbumin: 9 mg/dL — ABNORMAL LOW (ref 18–38)

## 2015-06-05 LAB — PHOSPHORUS: Phosphorus: 3.6 mg/dL (ref 2.5–4.6)

## 2015-06-05 LAB — MAGNESIUM: Magnesium: 1.8 mg/dL (ref 1.7–2.4)

## 2015-06-05 MED ORDER — SODIUM CHLORIDE 0.9 % IV SOLN
1.0000 mg/h | INTRAVENOUS | Status: DC
  Administered 2015-06-06 – 2015-06-07 (×4): 10 mg/h via INTRAVENOUS
  Administered 2015-06-08: 8 mg/h via INTRAVENOUS
  Administered 2015-06-08: 5 mg/h via INTRAVENOUS
  Administered 2015-06-09: 8 mg/h via INTRAVENOUS
  Administered 2015-06-10 – 2015-06-13 (×7): 7 mg/h via INTRAVENOUS
  Administered 2015-06-14: 4 mg/h via INTRAVENOUS
  Administered 2015-06-15: 8 mg/h via INTRAVENOUS
  Administered 2015-06-15 – 2015-06-16 (×2): 7 mg/h via INTRAVENOUS
  Filled 2015-06-05 (×21): qty 20

## 2015-06-05 MED ORDER — TRACE MINERALS CR-CU-MN-SE-ZN 10-1000-500-60 MCG/ML IV SOLN
INTRAVENOUS | Status: AC
  Administered 2015-06-05: 18:00:00 via INTRAVENOUS
  Filled 2015-06-05: qty 2640

## 2015-06-05 MED ORDER — ANTISEPTIC ORAL RINSE SOLUTION (CORINZ)
7.0000 mL | Freq: Four times a day (QID) | OROMUCOSAL | Status: DC
  Administered 2015-06-05 – 2015-06-25 (×78): 7 mL via OROMUCOSAL
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
## 2015-06-05 NOTE — Progress Notes (Addendum)
Follow up - Trauma and Critical Care  Patient Details:    Raymond Bowen is an 39 y.o. male.  Lines/tubes : PICC Triple Lumen 05/30/15 PICC Left Brachial 44 cm 0 cm (Active)  Indication for Insertion or Continuance of Line Administration of hyperosmolar/irritating solutions (i.e. TPN, Vancomycin, etc.) 06/04/2015  8:00 PM  Exposed Catheter (cm) 0 cm 05/30/2015  9:10 AM  Site Assessment Clean;Dry;Intact 06/04/2015  8:00 PM  Lumen #1 Status Infusing 06/04/2015  8:00 PM  Lumen #2 Status Infusing 06/04/2015  8:00 PM  Lumen #3 Status Infusing 06/04/2015  8:00 PM  Dressing Type Transparent 06/04/2015  8:00 PM  Dressing Status Clean;Dry;Intact 06/04/2015  8:00 PM  Line Care Connections checked and tightened 06/04/2015  8:00 PM  Dressing Change Due 06/06/15 06/04/2015  8:00 PM     Open Drain Right Abdomen (Active)  Site Description Unremarkable 06/04/2015  8:00 PM  Dressing Status Intact 06/04/2015  8:00 PM  Drainage Appearance Other (Comment) 06/04/2015  8:00 AM     Negative Pressure Wound Therapy Abdomen (Active)  Last dressing change 05/25/15 05/29/2015  8:00 PM  Site / Wound Assessment Clean;Dry 06/04/2015  8:00 PM  Peri-wound Assessment Intact 06/04/2015  8:00 PM  Cycle Continuous 06/04/2015  8:00 PM  Target Pressure (mmHg) 125 06/04/2015  8:00 PM  Canister Changed Yes 06/03/2015  5:00 PM  Dressing Status Intact 06/04/2015  8:00 PM  Drainage Amount Moderate 06/04/2015  8:00 PM  Drainage Description Serosanguineous 06/04/2015  8:00 PM  Output (mL) 275 mL 06/05/2015  6:00 AM     NG/OG Tube Nasogastric Left nare (Active)  Placement Verification Auscultation 06/04/2015  8:00 PM  Site Assessment Clean;Dry;Intact 06/04/2015  8:00 PM  Status Infusing tube feed 06/04/2015  8:00 PM  Drainage Appearance Bile;Green 06/04/2015 12:00 AM  Output (mL) 450 mL 06/03/2015  6:00 AM     Ileostomy RLQ (Active)  Ostomy Pouch 2 piece 06/04/2015  8:00 PM  Stoma Assessment Other (comment) 06/04/2015   8:00 PM  Peristomal Assessment Intact 06/04/2015  8:00 PM  Output (mL) 200 mL 06/05/2015  6:00 AM     Urethral Catheter [name], RN Latex 16 Fr. (Active)  Indication for Insertion or Continuance of Catheter Aggressive IV diuresis 06/05/2015  7:58 AM  Site Assessment Clean;Intact 06/04/2015  8:00 PM  Catheter Maintenance Bag below level of bladder;Catheter secured;Drainage bag/tubing not touching floor;Insertion date on drainage bag;No dependent loops;Seal intact 06/05/2015  7:58 AM  Collection Container Standard drainage bag 06/04/2015  8:00 PM  Securement Method Securing device (Describe) 06/04/2015  8:00 PM  Urinary Catheter Interventions Unclamped 06/04/2015  8:00 PM  Output (mL) 500 mL 05/25/2015  2:26 PM    Microbiology/Sepsis markers: Results for orders placed or performed during the hospital encounter of 05/07/15  MRSA PCR Screening     Status: None   Collection Time: 05/07/15 11:43 PM  Result Value Ref Range Status   MRSA by PCR NEGATIVE NEGATIVE Final    Comment:        The GeneXpert MRSA Assay (FDA approved for NASAL specimens only), is one component of a comprehensive MRSA colonization surveillance program. It is not intended to diagnose MRSA infection nor to guide or monitor treatment for MRSA infections.   Culture, Urine     Status: None   Collection Time: 05/14/15 11:22 AM  Result Value Ref Range Status   Specimen Description URINE, CATHETERIZED  Final   Special Requests NONE  Final   Culture NO GROWTH 1 DAY  Final  Report Status 05/15/2015 FINAL  Final  Culture, expectorated sputum-assessment     Status: None   Collection Time: 05/15/15  8:50 AM  Result Value Ref Range Status   Specimen Description SPUTUM  Final   Special Requests Normal  Final   Sputum evaluation   Final    THIS SPECIMEN IS ACCEPTABLE. RESPIRATORY CULTURE REPORT TO FOLLOW.   Report Status 05/15/2015 FINAL  Final  Culture, respiratory (NON-Expectorated)     Status: None   Collection Time:  05/15/15  8:50 AM  Result Value Ref Range Status   Specimen Description SPUTUM  Final   Special Requests NONE  Final   Gram Stain   Final    ABUNDANT WBC PRESENT, PREDOMINANTLY PMN RARE SQUAMOUS EPITHELIAL CELLS PRESENT MODERATE GRAM NEGATIVE RODS FEW GRAM POSITIVE COCCI IN PAIRS    Culture   Final    MODERATE PSEUDOMONAS AERUGINOSA MODERATE HAEMOPHILUS INFLUENZAE Note: BETA LACTAMASE NEGATIVE Performed at [facility]    Report Status 05/18/2015 FINAL  Final   Organism ID, Bacteria PSEUDOMONAS AERUGINOSA  Final      Susceptibility   Pseudomonas aeruginosa - MIC*    CEFEPIME 2 SENSITIVE Sensitive     CEFTAZIDIME 4 SENSITIVE Sensitive     CIPROFLOXACIN <=0.25 SENSITIVE Sensitive     GENTAMICIN <=1 SENSITIVE Sensitive     IMIPENEM 1 SENSITIVE Sensitive     PIP/TAZO 8 SENSITIVE Sensitive     TOBRAMYCIN <=1 SENSITIVE Sensitive     * MODERATE PSEUDOMONAS AERUGINOSA  Culture, respiratory (NON-Expectorated)     Status: None   Collection Time: 05/17/15 12:25 PM  Result Value Ref Range Status   Specimen Description TRACHEAL ASPIRATE  Final   Special Requests Normal  Final   Gram Stain   Final    RARE WBC PRESENT, PREDOMINANTLY MONONUCLEAR RARE SQUAMOUS EPITHELIAL CELLS PRESENT NO ORGANISMS SEEN Performed at [facility]    Culture   Final    FEW PSEUDOMONAS AERUGINOSA Performed at [facility]    Report Status 05/20/2015 FINAL  Final   Organism ID, Bacteria PSEUDOMONAS AERUGINOSA  Final      Susceptibility   Pseudomonas aeruginosa - MIC*    CEFEPIME 2 SENSITIVE Sensitive     CEFTAZIDIME 4 SENSITIVE Sensitive     CIPROFLOXACIN <=0.25 SENSITIVE Sensitive     GENTAMICIN <=1 SENSITIVE Sensitive     IMIPENEM 1 SENSITIVE Sensitive     PIP/TAZO 8 SENSITIVE Sensitive     TOBRAMYCIN <=1 SENSITIVE Sensitive     * FEW PSEUDOMONAS AERUGINOSA  Anaerobic culture     Status: None   Collection Time: 05/18/15 12:30 PM  Result Value Ref Range Status   Specimen  Description FLUID PERITONEAL  Final   Special Requests PT ON ERLAPENEM,MEROPENEM,ZOSYN  Final   Gram Stain   Final    FEW WBC PRESENT, PREDOMINANTLY PMN MODERATE GRAM POSITIVE RODS FEW GRAM POSITIVE COCCI IN PAIRS RARE GRAM NEGATIVE RODS    Culture   Final    MIXED ANAEROBIC FLORA PRESENT.  CALL LAB IF FURTHER IID REQUIRED.   Report Status 05/22/2015 FINAL  Final  Body fluid culture     Status: None   Collection Time: 05/18/15 12:30 PM  Result Value Ref Range Status   Specimen Description FLUID PERITONEAL  Final   Special Requests PT ON ERLAPENEM,MEROPENEM,ZOSYN  Final   Gram Stain   Final    FEW WBC PRESENT, PREDOMINANTLY PMN MODERATE GRAM POSITIVE RODS FEW GRAM POSITIVE COCCI IN PAIRS RARE GRAM  NEGATIVE RODS    Culture   Final    RARE ESCHERICHIA COLI FEW ENTEROCOCCUS SPECIES RARE PSEUDOMONAS AERUGINOSA    Report Status 05/21/2015 FINAL  Final   Organism ID, Bacteria ESCHERICHIA COLI  Final   Organism ID, Bacteria ENTEROCOCCUS SPECIES  Final   Organism ID, Bacteria PSEUDOMONAS AERUGINOSA  Final      Susceptibility   Escherichia coli - MIC*    AMPICILLIN <=2 SENSITIVE Sensitive     CEFAZOLIN <=4 SENSITIVE Sensitive     CEFEPIME <=1 SENSITIVE Sensitive     CEFTAZIDIME <=1 SENSITIVE Sensitive     CEFTRIAXONE <=1 SENSITIVE Sensitive     CIPROFLOXACIN <=0.25 SENSITIVE Sensitive     GENTAMICIN <=1 SENSITIVE Sensitive     IMIPENEM <=0.25 SENSITIVE Sensitive     TRIMETH/SULFA <=20 SENSITIVE Sensitive     AMPICILLIN/SULBACTAM <=2 SENSITIVE Sensitive     PIP/TAZO <=4 SENSITIVE Sensitive     * RARE ESCHERICHIA COLI   Pseudomonas aeruginosa - MIC*    CEFTAZIDIME 4 SENSITIVE Sensitive     CIPROFLOXACIN <=0.25 SENSITIVE Sensitive     GENTAMICIN <=1 SENSITIVE Sensitive     IMIPENEM 1 SENSITIVE Sensitive     PIP/TAZO 8 SENSITIVE Sensitive     CEFEPIME 4 SENSITIVE Sensitive     * RARE PSEUDOMONAS AERUGINOSA   Enterococcus species - MIC*    AMPICILLIN <=2 SENSITIVE Sensitive      VANCOMYCIN <=0.5 SENSITIVE Sensitive     GENTAMICIN SYNERGY SENSITIVE Sensitive     * FEW ENTEROCOCCUS SPECIES  Culture, blood (routine x 2)     Status: None   Collection Time: 05/25/15  4:19 AM  Result Value Ref Range Status   Specimen Description BLOOD LEFT ANTECUBITAL  Final   Special Requests IN PEDIATRIC BOTTLE 3CC  Final   Culture  Setup Time   Final    GRAM POSITIVE COCCI IN CLUSTERS AEROBIC BOTTLE ONLY CRITICAL RESULT CALLED TO, READ BACK BY AND VERIFIED WITH: KAREN  05/26/15 MKELLY    Culture   Final    STAPHYLOCOCCUS SPECIES (COAGULASE NEGATIVE) THE SIGNIFICANCE OF ISOLATING THIS ORGANISM FROM A SINGLE SET OF BLOOD CULTURES WHEN MULTIPLE SETS ARE DRAWN IS UNCERTAIN. PLEASE NOTIFY THE MICROBIOLOGY DEPARTMENT WITHIN ONE WEEK IF SPECIATION AND SENSITIVITIES ARE REQUIRED.    Report Status 05/27/2015 FINAL  Final  Culture, blood (routine x 2)     Status: None   Collection Time: 05/25/15  6:21 AM  Result Value Ref Range Status   Specimen Description BLOOD LEFT ANTECUBITAL  Final   Special Requests BOTTLES DRAWN AEROBIC ONLY 5CCS  Final   Culture NO GROWTH 5 DAYS  Final   Report Status 05/30/2015 FINAL  Final  C difficile quick scan w PCR reflex     Status: None   Collection Time: 06/02/15 11:00 AM  Result Value Ref Range Status   C Diff antigen NEGATIVE NEGATIVE Final   C Diff toxin NEGATIVE NEGATIVE Final   C Diff interpretation Negative for toxigenic C. difficile  Final  Urine culture     Status: None   Collection Time: 06/02/15 11:00 AM  Result Value Ref Range Status   Specimen Description URINE, RANDOM  Final   Special Requests NONE  Final   Culture NO GROWTH 1 DAY  Final   Report Status 06/03/2015 FINAL  Final    Anti-infectives:  Anti-infectives    Start     Dose/Rate Route Frequency Ordered Stop   06/02/15 1800  ertapenem (INVANZ) 1 g in  sodium chloride 0.9 % 50 mL IVPB     1 g 100 mL/hr over 30 Minutes Intravenous Every 24 hours 06/02/15 1750      05/26/15 2000  vancomycin (VANCOCIN) IVPB 1000 mg/200 mL premix     1,000 mg 200 mL/hr over 60 Minutes Intravenous Every 8 hours 05/26/15 0755 06/01/15 1233   05/26/15 0830  vancomycin (VANCOCIN) 1,500 mg in sodium chloride 0.9 % 500 mL IVPB     1,500 mg 250 mL/hr over 120 Minutes Intravenous  Once 05/26/15 0743 05/26/15 1021   05/26/15 0730  ertapenem (INVANZ) 1 g in sodium chloride 0.9 % 50 mL IVPB  Status:  Discontinued     1 g 100 mL/hr over 30 Minutes Intravenous Every 24 hours 05/26/15 0729 05/26/15 0741   05/20/15 1330  piperacillin-tazobactam (ZOSYN) IVPB 3.375 g  Status:  Discontinued     3.375 g 12.5 mL/hr over 240 Minutes Intravenous Every 8 hours 05/20/15 1321 05/31/15 1136   05/17/15 1000  ertapenem (INVANZ) 1 g in sodium chloride 0.9 % 50 mL IVPB  Status:  Discontinued     1 g 100 mL/hr over 30 Minutes Intravenous Every 24 hours 05/17/15 0936 05/17/15 0951   05/17/15 1000  meropenem (MERREM) 1 g in sodium chloride 0.9 % 100 mL IVPB  Status:  Discontinued     1 g 200 mL/hr over 30 Minutes Intravenous 3 times per day 05/17/15 0951 05/20/15 1321   05/15/15 0815  piperacillin-tazobactam (ZOSYN) IVPB 3.375 g  Status:  Discontinued     3.375 g 12.5 mL/hr over 240 Minutes Intravenous 3 times per day 05/15/15 0804 05/17/15 0951   05/08/15 0200  ceFAZolin (ANCEF) IVPB 2 g/50 mL premix     2 g 100 mL/hr over 30 Minutes Intravenous 3 times per day 05/08/15 0140 05/09/15 2238   05/08/15 0130  ceFAZolin (ANCEF) powder 2 g  Status:  Discontinued     2 g Other Every 8 hours 05/08/15 0129 05/08/15 0139      Best Practice/Protocols:  VTE Prophylaxis: Heparin (drip) and (for DVT) and Mechanical GI Prophylaxis: Proton Pump Inhibitor Continous Sedation  Consults: Treatment Team:  Nada LibmanVance W Brabham, MD    Events:  Subjective:    Overnight Issues: Still very alert on the sedation.  No new issues.  Spiking fevers.  Objective:  Vital signs for last 24 hours: Temp:  [98.3 F (36.8  C)-102.3 F (39.1 C)] 102 F (38.9 C) (11/14 0800) Pulse Rate:  [80-117] 92 (11/14 0808) Resp:  [18-36] 18 (11/14 0808) BP: (112-146)/(61-94) 119/66 mmHg (11/14 0808) SpO2:  [100 %] 100 % (11/14 0808) FiO2 (%):  [40 %] 40 % (11/14 0808) Weight:  [93.7 kg (206 lb 9.1 oz)] 93.7 kg (206 lb 9.1 oz) (11/14 0800)  Hemodynamic parameters for last 24 hours:    Intake/Output from previous day: 11/13 0701 - 11/14 0700 In: 6294 [I.V.:2908.9; NG/GT:540; IV Piggyback:50; TPN:2795.2] Out: 6280 [Urine:3655; Drains:725; Stool:1900]  Intake/Output this shift: Total I/O In: 288.1 [I.V.:173.1; NG/GT:15; TPN:100] Out: 260 [Urine:260]  Vent settings for last 24 hours: Vent Mode:  [-] PRVC FiO2 (%):  [40 %] 40 % Set Rate:  [16 bmp] 16 bmp Vt Set:  [550 mL] 550 mL PEEP:  [5 cmH20] 5 cmH20 Plateau Pressure:  [25 cmH20-37 cmH20] 27 cmH20  Physical Exam:  General: alert, no respiratory distress and but breathing somewhat rapidly Neuro: alert, oriented and nonfocal exam Resp: rhonchi bilaterally CVS: regular rate and rhythm, S1, S2 normal, no murmur,  click, rub or gallop GI: Open abdomen with ABRA system in place.  No leak of the VAC.  Ileostomy still draining with Penrose in place. Extremities: edema 3+ and unequal size  Results for orders placed or performed during the hospital encounter of 05/07/15 (from the past 24 hour(s))  Glucose, capillary     Status: Abnormal   Collection Time: 06/04/15 11:47 AM  Result Value Ref Range   Glucose-Capillary 135 (H) 65 - 99 mg/dL  Heparin level (unfractionated)     Status: Abnormal   Collection Time: 06/04/15 12:00 PM  Result Value Ref Range   Heparin Unfractionated 1.70 (H) 0.30 - 0.70 IU/mL  Heparin level (unfractionated)     Status: Abnormal   Collection Time: 06/04/15  3:15 PM  Result Value Ref Range   Heparin Unfractionated 0.27 (L) 0.30 - 0.70 IU/mL  Glucose, capillary     Status: Abnormal   Collection Time: 06/04/15  4:10 PM  Result Value Ref  Range   Glucose-Capillary 138 (H) 65 - 99 mg/dL  Glucose, capillary     Status: Abnormal   Collection Time: 06/04/15  7:16 PM  Result Value Ref Range   Glucose-Capillary 146 (H) 65 - 99 mg/dL  Heparin level (unfractionated)     Status: Abnormal   Collection Time: 06/04/15 10:00 PM  Result Value Ref Range   Heparin Unfractionated 0.74 (H) 0.30 - 0.70 IU/mL  Glucose, capillary     Status: Abnormal   Collection Time: 06/04/15 11:44 PM  Result Value Ref Range   Glucose-Capillary 111 (H) 65 - 99 mg/dL  Glucose, capillary     Status: Abnormal   Collection Time: 06/05/15  3:21 AM  Result Value Ref Range   Glucose-Capillary 100 (H) 65 - 99 mg/dL  CBC     Status: Abnormal   Collection Time: 06/05/15  5:30 AM  Result Value Ref Range   WBC 29.7 (H) 4.0 - 10.5 K/uL   RBC 2.55 (L) 4.22 - 5.81 MIL/uL   Hemoglobin 7.1 (L) 13.0 - 17.0 g/dL   HCT 16.1 (L) 09.6 - 04.5 %   MCV 86.3 78.0 - 100.0 fL   MCH 27.8 26.0 - 34.0 pg   MCHC 32.3 30.0 - 36.0 g/dL   RDW 40.9 (H) 81.1 - 91.4 %   Platelets 708 (H) 150 - 400 K/uL  Comprehensive metabolic panel     Status: Abnormal   Collection Time: 06/05/15  5:30 AM  Result Value Ref Range   Sodium 132 (L) 135 - 145 mmol/L   Potassium 4.3 3.5 - 5.1 mmol/L   Chloride 96 (L) 101 - 111 mmol/L   CO2 29 22 - 32 mmol/L   Glucose, Bld 130 (H) 65 - 99 mg/dL   BUN 16 6 - 20 mg/dL   Creatinine, Ser 7.82 0.61 - 1.24 mg/dL   Calcium 8.2 (L) 8.9 - 10.3 mg/dL   Total Protein 7.3 6.5 - 8.1 g/dL   Albumin 1.1 (L) 3.5 - 5.0 g/dL   AST 956 (H) 15 - 41 U/L   ALT 314 (H) 17 - 63 U/L   Alkaline Phosphatase 121 38 - 126 U/L   Total Bilirubin 2.4 (H) 0.3 - 1.2 mg/dL   GFR calc non Af Amer >60 >60 mL/min   GFR calc Af Amer >60 >60 mL/min   Anion gap 7 5 - 15  Magnesium     Status: None   Collection Time: 06/05/15  5:30 AM  Result Value Ref Range   Magnesium 1.8  1.7 - 2.4 mg/dL  Phosphorus     Status: None   Collection Time: 06/05/15  5:30 AM  Result Value Ref Range    Phosphorus 3.6 2.5 - 4.6 mg/dL  Differential     Status: Abnormal   Collection Time: 06/05/15  5:30 AM  Result Value Ref Range   Neutrophils Relative % 78 %   Lymphocytes Relative 10 %   Monocytes Relative 10 %   Eosinophils Relative 2 %   Basophils Relative 0 %   Neutro Abs 23.1 (H) 1.7 - 7.7 K/uL   Lymphs Abs 3.0 0.7 - 4.0 K/uL   Monocytes Absolute 3.0 (H) 0.1 - 1.0 K/uL   Eosinophils Absolute 0.6 0.0 - 0.7 K/uL   Basophils Absolute 0.0 0.0 - 0.1 K/uL   RBC Morphology POLYCHROMASIA PRESENT    WBC Morphology MILD LEFT SHIFT (1-5% METAS, OCC MYELO, OCC BANDS)   Prealbumin     Status: Abnormal   Collection Time: 06/05/15  5:30 AM  Result Value Ref Range   Prealbumin 9.0 (L) 18 - 38 mg/dL  Triglycerides     Status: Abnormal   Collection Time: 06/05/15  5:30 AM  Result Value Ref Range   Triglycerides 234 (H) <150 mg/dL  Glucose, capillary     Status: Abnormal   Collection Time: 06/05/15  7:26 AM  Result Value Ref Range   Glucose-Capillary 110 (H) 65 - 99 mg/dL  Heparin level (unfractionated)     Status: Abnormal   Collection Time: 06/05/15  7:44 AM  Result Value Ref Range   Heparin Unfractionated 0.80 (H) 0.30 - 0.70 IU/mL     Assessment/Plan:   NEURO  Mentally holding up well.  May be depressed.   Plan: CPM  PULM  No known problem other than atelectasis. CPM   Plan: CPM.  May wean on the ventilator with sedation cut back.  Has not used PM that I can tell.  Secretions are controlled.  CARDIO  No specific issues   Plan: CPM  RENAL  Hypervolemia and Third Spacing   Plan: May be able to diurese  GI  Open abdomen, ABRA in place.  Necrositc and partially detached ileostomy   Plan: Re-examinae with ABRA adjustment and VAC change.  ID  Pneumonia (hospital acquired (not ventilator-associated) Pseudomonas pneumonia has been treated.)   Plan: On invanz for the parastomal infection.  HEME  Anemia acute blood loss anemia and anemia of critical illness)   Plan: No blood  needed for now, but hemoglobin is down to 7.1 and may need blood soon.  ENDO No specific issues.   Plan: CPM  Global Issues  Patient is doing fair.  May need to take back to the OR Wednesday or tThursday for consideration of closure or and to look at the ielostomy more closely.    LOS: 29 days   Additional comments:I reviewed the patient's new clinical lab test results. cbc/bmet and I reviewed the patients new imaging test results. no CXR  Critical Care Total Time*: 31 minutes of critical care management and evaluation.  Latrail Pounders 06/05/2015  *Care during the described time interval was provided by me and/or other providers on the critical care team.  I have reviewed this patient's available data, including medical history, events of note, physical examination and test results as part of my evaluation.

## 2015-06-05 NOTE — Consult Note (Addendum)
WOC wound follow up Wound type: Consult requested to assess trach wounds.  Trach face plate has previously been tightly sutured in place, and it is also difficult to reduce pressure to the affected area related to the patient's neck size and positioning to keep HOB up 30 degrees. Measurement: Left side of trach: full thickness wound .5X1X.2cm, 90% yellow, 10% red, mod amt tan drainage, no odor. Right side of trach: full thickness wound 1.8X1X.2cm, 100% red and moist, no odor, mod amt tan drainage. Dressing procedure/placement/frequency: Assessed wound with trauma team at bedside.  It will be difficult to promote healing since patient's neck prevents reduction of pressure to this location.  Aquacel to absorb drainage and provide antimicrobial benefits.  Foam dressing to reduce pressure under trach face plate. WOC team will continue to follow for ostomy. Trauma team following for assessment and plan of care to abd wound. Cammie Mcgeeawn Rheanne Cortopassi MSN, RN, CWOCN, [place] ParkWCN-AP, CNS [phone]
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
## 2015-06-05 NOTE — Progress Notes (Signed)
SLP Cancellation Note  Patient Details Name: Raymond Bowen MRN: [id number] DOB: 10-21-1975   Cancelled treatment:       Reason Eval/Treat Not Completed: Medical issues which prohibited therapy. Per RN, patient remains on 3 different sedatives, minimally alert. RN plans to consult with trauma MD this am regarding possible reduction of sedation to facilitate increased participation/communication. Plan to f/u mid morning or this pm for appropriateness for in-line PMSV trials.   Raymond LangoLeah Gemma Ruan MA, CCC-SLP [phone]    Raymond Bowen 06/05/2015, 8:22 AM
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
## 2015-06-06 ENCOUNTER — Inpatient Hospital Stay (HOSPITAL_COMMUNITY): Payer: No Typology Code available for payment source

## 2015-06-06 LAB — BASIC METABOLIC PANEL
Anion gap: 9 (ref 5–15)
BUN: 16 mg/dL (ref 6–20)
CO2: 29 mmol/L (ref 22–32)
Calcium: 8.3 mg/dL — ABNORMAL LOW (ref 8.9–10.3)
Chloride: 95 mmol/L — ABNORMAL LOW (ref 101–111)
Creatinine, Ser: 0.62 mg/dL (ref 0.61–1.24)
GFR calc Af Amer: >60 mL/min (ref >60)
GFR calc non Af Amer: >60 mL/min (ref >60)
Glucose, Bld: 125 mg/dL — ABNORMAL HIGH (ref 65–99)
Potassium: 4 mmol/L (ref 3.5–5.1)
Sodium: 133 mmol/L — ABNORMAL LOW (ref 135–145)

## 2015-06-06 LAB — CBC
HCT: 22.7 % — ABNORMAL LOW (ref 39.0–52.0)
Hemoglobin: 7.3 g/dL — ABNORMAL LOW (ref 13.0–17.0)
MCH: 28.1 pg (ref 26.0–34.0)
MCHC: 32.2 g/dL (ref 30.0–36.0)
MCV: 87.3 fL (ref 78.0–100.0)
Platelets: 691 10*3/uL — ABNORMAL HIGH (ref 150–400)
RBC: 2.6 MIL/uL — ABNORMAL LOW (ref 4.22–5.81)
RDW: 16.2 % — ABNORMAL HIGH (ref 11.5–15.5)
WBC: 30.6 10*3/uL — ABNORMAL HIGH (ref 4.0–10.5)

## 2015-06-06 LAB — GLUCOSE, CAPILLARY
Glucose-Capillary: 120 mg/dL — ABNORMAL HIGH (ref 65–99)
Glucose-Capillary: 121 mg/dL — ABNORMAL HIGH (ref 65–99)
Glucose-Capillary: 98 mg/dL (ref 65–99)
Glucose-Capillary: 104 mg/dL — ABNORMAL HIGH (ref 65–99)

## 2015-06-06 LAB — HEPARIN LEVEL (UNFRACTIONATED)
Heparin Unfractionated: 0.59 IU/mL (ref 0.30–0.70)
Heparin Unfractionated: 0.57 IU/mL (ref 0.30–0.70)

## 2015-06-06 MED ORDER — SODIUM CHLORIDE 0.9 % IV SOLN
Freq: Once | INTRAVENOUS | Status: AC
  Administered 2015-06-07: 10:00:00 via INTRAVENOUS

## 2015-06-06 MED ORDER — CLINIMIX E/DEXTROSE (5/15) 5 % IV SOLN
INTRAVENOUS | Status: AC
  Administered 2015-06-06: 18:00:00 via INTRAVENOUS
  Filled 2015-06-06: qty 2640

## 2015-06-06 MED ORDER — SODIUM CHLORIDE 0.9 % IV SOLN
1.0000 g | Freq: Three times a day (TID) | INTRAVENOUS | Status: DC
  Administered 2015-06-06 – 2015-06-08 (×6): 1 g via INTRAVENOUS
  Filled 2015-06-06 (×8): qty 1

## 2015-06-06 MED ORDER — BACITRACIN-NEOMYCIN-POLYMYXIN OINTMENT TUBE
TOPICAL_OINTMENT | Freq: Two times a day (BID) | CUTANEOUS | Status: DC
  Administered 2015-06-06: 1 via TOPICAL
  Administered 2015-06-06 – 2015-06-07 (×2): via TOPICAL
  Administered 2015-06-08 – 2015-06-09 (×4): 1 via TOPICAL
  Administered 2015-06-10 – 2015-06-14 (×8): via TOPICAL
  Administered 2015-06-14: 1 via TOPICAL
  Administered 2015-06-15 – 2015-06-17 (×4): via TOPICAL
  Administered 2015-06-18: 1 via TOPICAL
  Administered 2015-06-18 – 2015-06-23 (×10): via TOPICAL
  Administered 2015-06-24: 1 via TOPICAL
  Administered 2015-06-24: 21:00:00 via TOPICAL
  Administered 2015-06-25: 1 via TOPICAL
  Administered 2015-06-26 – 2015-07-03 (×14): via TOPICAL
  Filled 2015-06-06 (×2): qty 15
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
## 2015-06-06 NOTE — Progress Notes (Signed)
Follow up - Trauma and Critical Care  Patient Details:    Raymond Bowen is an 39 y.o. male.  Lines/tubes : PICC Triple Lumen 05/30/15 PICC Left Brachial 44 cm 0 cm (Active)  Indication for Insertion or Continuance of Line Administration of hyperosmolar/irritating solutions (i.e. TPN, Vancomycin, etc.) 06/06/2015  8:00 AM  Exposed Catheter (cm) 0 cm 05/30/2015  9:10 AM  Site Assessment Clean;Dry;Intact 06/06/2015  8:00 AM  Lumen #1 Status Infusing 06/06/2015  8:00 AM  Lumen #2 Status Infusing 06/06/2015  8:00 AM  Lumen #3 Status Infusing 06/06/2015  8:00 AM  Dressing Type Transparent;Occlusive 06/06/2015  8:00 AM  Dressing Status Clean;Dry;Intact 06/06/2015  8:00 AM  Line Care Connections checked and tightened 06/06/2015  8:00 AM  Dressing Change Due 06/06/15 06/06/2015  8:00 AM     Open Drain Right Abdomen (Active)  Site Description Unremarkable 06/06/2015  8:00 AM  Dressing Status Intact 06/06/2015  8:00 AM  Drainage Appearance Other (Comment) 06/06/2015  8:00 AM     Negative Pressure Wound Therapy Abdomen (Active)  Last dressing change 06/05/15 06/06/2015  8:00 AM  Site / Wound Assessment Clean;Dry 06/06/2015  8:00 AM  Peri-wound Assessment Intact 06/06/2015  8:00 AM  Wound filler - Black foam 2 06/06/2015  8:00 AM  Cycle Continuous 06/06/2015  8:00 AM  Target Pressure (mmHg) 125 06/06/2015  8:00 AM  Canister Changed Yes 06/03/2015  5:00 PM  Dressing Status Intact 06/06/2015  8:00 AM  Drainage Amount Moderate 06/06/2015  8:00 AM  Drainage Description Serosanguineous 06/06/2015  8:00 AM  Output (mL) 50 mL 06/05/2015  1:30 PM     NG/OG Tube Nasogastric Left nare (Active)  Placement Verification Auscultation 06/06/2015  8:00 AM  Site Assessment Clean;Dry;Intact 06/06/2015  8:00 AM  Status Infusing tube feed 06/06/2015  8:00 AM  Drainage Appearance Tan 06/06/2015  8:00 AM  Output (mL) 450 mL 06/03/2015  6:00 AM     Ileostomy RLQ (Active)  Ostomy Pouch 2 piece 06/06/2015   8:00 AM  Stoma Assessment Red;Edema 06/06/2015  8:00 AM  Peristomal Assessment Intact 06/06/2015  8:00 AM  Output (mL) 450 mL 06/06/2015  4:00 AM     Urethral Catheter [name], RN Latex 16 Fr. (Active)  Indication for Insertion or Continuance of Catheter Aggressive IV diuresis 06/06/2015  8:00 AM  Site Assessment Clean;Intact 06/06/2015  8:00 AM  Catheter Maintenance Bag below level of bladder;Catheter secured;Drainage bag/tubing not touching floor;Insertion date on drainage bag;No dependent loops;Seal intact 06/06/2015  8:00 AM  Collection Container Standard drainage bag 06/06/2015  8:00 AM  Securement Method Securing device (Describe) 06/06/2015  8:00 AM  Urinary Catheter Interventions Unclamped 06/06/2015  8:00 AM  Output (mL) 500 mL 05/25/2015  2:26 PM    Microbiology/Sepsis markers: Results for orders placed or performed during the hospital encounter of 05/07/15  MRSA PCR Screening     Status: None   Collection Time: 05/07/15 11:43 PM  Result Value Ref Range Status   MRSA by PCR NEGATIVE NEGATIVE Final    Comment:        The GeneXpert MRSA Assay (FDA approved for NASAL specimens only), is one component of a comprehensive MRSA colonization surveillance program. It is not intended to diagnose MRSA infection nor to guide or monitor treatment for MRSA infections.   Culture, Urine     Status: None   Collection Time: 05/14/15 11:22 AM  Result Value Ref Range Status   Specimen Description URINE, CATHETERIZED  Final   Special Requests NONE  Final  Culture NO GROWTH 1 DAY  Final   Report Status 05/15/2015 FINAL  Final  Culture, expectorated sputum-assessment     Status: None   Collection Time: 05/15/15  8:50 AM  Result Value Ref Range Status   Specimen Description SPUTUM  Final   Special Requests Normal  Final   Sputum evaluation   Final    THIS SPECIMEN IS ACCEPTABLE. RESPIRATORY CULTURE REPORT TO FOLLOW.   Report Status 05/15/2015 FINAL  Final  Culture, respiratory  (NON-Expectorated)     Status: None   Collection Time: 05/15/15  8:50 AM  Result Value Ref Range Status   Specimen Description SPUTUM  Final   Special Requests NONE  Final   Gram Stain   Final    ABUNDANT WBC PRESENT, PREDOMINANTLY PMN RARE SQUAMOUS EPITHELIAL CELLS PRESENT MODERATE GRAM NEGATIVE RODS FEW GRAM POSITIVE COCCI IN PAIRS    Culture   Final    MODERATE PSEUDOMONAS AERUGINOSA MODERATE HAEMOPHILUS INFLUENZAE Note: BETA LACTAMASE NEGATIVE Performed at [facility]    Report Status 05/18/2015 FINAL  Final   Organism ID, Bacteria PSEUDOMONAS AERUGINOSA  Final      Susceptibility   Pseudomonas aeruginosa - MIC*    CEFEPIME 2 SENSITIVE Sensitive     CEFTAZIDIME 4 SENSITIVE Sensitive     CIPROFLOXACIN <=0.25 SENSITIVE Sensitive     GENTAMICIN <=1 SENSITIVE Sensitive     IMIPENEM 1 SENSITIVE Sensitive     PIP/TAZO 8 SENSITIVE Sensitive     TOBRAMYCIN <=1 SENSITIVE Sensitive     * MODERATE PSEUDOMONAS AERUGINOSA  Culture, respiratory (NON-Expectorated)     Status: None   Collection Time: 05/17/15 12:25 PM  Result Value Ref Range Status   Specimen Description TRACHEAL ASPIRATE  Final   Special Requests Normal  Final   Gram Stain   Final    RARE WBC PRESENT, PREDOMINANTLY MONONUCLEAR RARE SQUAMOUS EPITHELIAL CELLS PRESENT NO ORGANISMS SEEN Performed at [facility]    Culture   Final    FEW PSEUDOMONAS AERUGINOSA Performed at [facility]    Report Status 05/20/2015 FINAL  Final   Organism ID, Bacteria PSEUDOMONAS AERUGINOSA  Final      Susceptibility   Pseudomonas aeruginosa - MIC*    CEFEPIME 2 SENSITIVE Sensitive     CEFTAZIDIME 4 SENSITIVE Sensitive     CIPROFLOXACIN <=0.25 SENSITIVE Sensitive     GENTAMICIN <=1 SENSITIVE Sensitive     IMIPENEM 1 SENSITIVE Sensitive     PIP/TAZO 8 SENSITIVE Sensitive     TOBRAMYCIN <=1 SENSITIVE Sensitive     * FEW PSEUDOMONAS AERUGINOSA  Anaerobic culture     Status: None   Collection Time:  05/18/15 12:30 PM  Result Value Ref Range Status   Specimen Description FLUID PERITONEAL  Final   Special Requests PT ON ERLAPENEM,MEROPENEM,ZOSYN  Final   Gram Stain   Final    FEW WBC PRESENT, PREDOMINANTLY PMN MODERATE GRAM POSITIVE RODS FEW GRAM POSITIVE COCCI IN PAIRS RARE GRAM NEGATIVE RODS    Culture   Final    MIXED ANAEROBIC FLORA PRESENT.  CALL LAB IF FURTHER IID REQUIRED.   Report Status 05/22/2015 FINAL  Final  Body fluid culture     Status: None   Collection Time: 05/18/15 12:30 PM  Result Value Ref Range Status   Specimen Description FLUID PERITONEAL  Final   Special Requests PT ON ERLAPENEM,MEROPENEM,ZOSYN  Final   Gram Stain   Final    FEW WBC PRESENT, PREDOMINANTLY PMN MODERATE GRAM POSITIVE  RODS FEW GRAM POSITIVE COCCI IN PAIRS RARE GRAM NEGATIVE RODS    Culture   Final    RARE ESCHERICHIA COLI FEW ENTEROCOCCUS SPECIES RARE PSEUDOMONAS AERUGINOSA    Report Status 05/21/2015 FINAL  Final   Organism ID, Bacteria ESCHERICHIA COLI  Final   Organism ID, Bacteria ENTEROCOCCUS SPECIES  Final   Organism ID, Bacteria PSEUDOMONAS AERUGINOSA  Final      Susceptibility   Escherichia coli - MIC*    AMPICILLIN <=2 SENSITIVE Sensitive     CEFAZOLIN <=4 SENSITIVE Sensitive     CEFEPIME <=1 SENSITIVE Sensitive     CEFTAZIDIME <=1 SENSITIVE Sensitive     CEFTRIAXONE <=1 SENSITIVE Sensitive     CIPROFLOXACIN <=0.25 SENSITIVE Sensitive     GENTAMICIN <=1 SENSITIVE Sensitive     IMIPENEM <=0.25 SENSITIVE Sensitive     TRIMETH/SULFA <=20 SENSITIVE Sensitive     AMPICILLIN/SULBACTAM <=2 SENSITIVE Sensitive     PIP/TAZO <=4 SENSITIVE Sensitive     * RARE ESCHERICHIA COLI   Pseudomonas aeruginosa - MIC*    CEFTAZIDIME 4 SENSITIVE Sensitive     CIPROFLOXACIN <=0.25 SENSITIVE Sensitive     GENTAMICIN <=1 SENSITIVE Sensitive     IMIPENEM 1 SENSITIVE Sensitive     PIP/TAZO 8 SENSITIVE Sensitive     CEFEPIME 4 SENSITIVE Sensitive     * RARE PSEUDOMONAS AERUGINOSA    Enterococcus species - MIC*    AMPICILLIN <=2 SENSITIVE Sensitive     VANCOMYCIN <=0.5 SENSITIVE Sensitive     GENTAMICIN SYNERGY SENSITIVE Sensitive     * FEW ENTEROCOCCUS SPECIES  Culture, blood (routine x 2)     Status: None   Collection Time: 05/25/15  4:19 AM  Result Value Ref Range Status   Specimen Description BLOOD LEFT ANTECUBITAL  Final   Special Requests IN PEDIATRIC BOTTLE 3CC  Final   Culture  Setup Time   Final    GRAM POSITIVE COCCI IN CLUSTERS AEROBIC BOTTLE ONLY CRITICAL RESULT CALLED TO, READ BACK BY AND VERIFIED WITH: KAREN  05/26/15 MKELLY    Culture   Final    STAPHYLOCOCCUS SPECIES (COAGULASE NEGATIVE) THE SIGNIFICANCE OF ISOLATING THIS ORGANISM FROM A SINGLE SET OF BLOOD CULTURES WHEN MULTIPLE SETS ARE DRAWN IS UNCERTAIN. PLEASE NOTIFY THE MICROBIOLOGY DEPARTMENT WITHIN ONE WEEK IF SPECIATION AND SENSITIVITIES ARE REQUIRED.    Report Status 05/27/2015 FINAL  Final  Culture, blood (routine x 2)     Status: None   Collection Time: 05/25/15  6:21 AM  Result Value Ref Range Status   Specimen Description BLOOD LEFT ANTECUBITAL  Final   Special Requests BOTTLES DRAWN AEROBIC ONLY 5CCS  Final   Culture NO GROWTH 5 DAYS  Final   Report Status 05/30/2015 FINAL  Final  C difficile quick scan w PCR reflex     Status: None   Collection Time: 06/02/15 11:00 AM  Result Value Ref Range Status   C Diff antigen NEGATIVE NEGATIVE Final   C Diff toxin NEGATIVE NEGATIVE Final   C Diff interpretation Negative for toxigenic C. difficile  Final  Urine culture     Status: None   Collection Time: 06/02/15 11:00 AM  Result Value Ref Range Status   Specimen Description URINE, RANDOM  Final   Special Requests NONE  Final   Culture NO GROWTH 1 DAY  Final   Report Status 06/03/2015 FINAL  Final    Anti-infectives:  Anti-infectives    Start     Dose/Rate Route Frequency Ordered Stop  06/02/15 1800  ertapenem (INVANZ) 1 g in sodium chloride 0.9 % 50 mL IVPB     1 g 100  mL/hr over 30 Minutes Intravenous Every 24 hours 06/02/15 1750     05/26/15 2000  vancomycin (VANCOCIN) IVPB 1000 mg/200 mL premix     1,000 mg 200 mL/hr over 60 Minutes Intravenous Every 8 hours 05/26/15 0755 06/01/15 1233   05/26/15 0830  vancomycin (VANCOCIN) 1,500 mg in sodium chloride 0.9 % 500 mL IVPB     1,500 mg 250 mL/hr over 120 Minutes Intravenous  Once 05/26/15 0743 05/26/15 1021   05/26/15 0730  ertapenem (INVANZ) 1 g in sodium chloride 0.9 % 50 mL IVPB  Status:  Discontinued     1 g 100 mL/hr over 30 Minutes Intravenous Every 24 hours 05/26/15 0729 05/26/15 0741   05/20/15 1330  piperacillin-tazobactam (ZOSYN) IVPB 3.375 g  Status:  Discontinued     3.375 g 12.5 mL/hr over 240 Minutes Intravenous Every 8 hours 05/20/15 1321 05/31/15 1136   05/17/15 1000  ertapenem (INVANZ) 1 g in sodium chloride 0.9 % 50 mL IVPB  Status:  Discontinued     1 g 100 mL/hr over 30 Minutes Intravenous Every 24 hours 05/17/15 0936 05/17/15 0951   05/17/15 1000  meropenem (MERREM) 1 g in sodium chloride 0.9 % 100 mL IVPB  Status:  Discontinued     1 g 200 mL/hr over 30 Minutes Intravenous 3 times per day 05/17/15 0951 05/20/15 1321   05/15/15 0815  piperacillin-tazobactam (ZOSYN) IVPB 3.375 g  Status:  Discontinued     3.375 g 12.5 mL/hr over 240 Minutes Intravenous 3 times per day 05/15/15 0804 05/17/15 0951   05/08/15 0200  ceFAZolin (ANCEF) IVPB 2 g/50 mL premix     2 g 100 mL/hr over 30 Minutes Intravenous 3 times per day 05/08/15 0140 05/09/15 2238   05/08/15 0130  ceFAZolin (ANCEF) powder 2 g  Status:  Discontinued     2 g Other Every 8 hours 05/08/15 0129 05/08/15 0139      Best Practice/Protocols:  VTE Prophylaxis: Heparin (drip) GI Prophylaxis: Proton Pump Inhibitor Continous Sedation  Consults:      Events:  Subjective:    Overnight Issues: Patient spiked a fever this AM and has increasing WBC count.  Otherwise stable.  Objective:  Vital signs for last 24 hours: Temp:   [100 F (37.8 C)-102.3 F (39.1 C)] 102.3 F (39.1 C) (11/15 0740) Pulse Rate:  [85-97] 87 (11/15 0802) Resp:  [16-21] 19 (11/15 0802) BP: (106-135)/(62-93) 107/68 mmHg (11/15 0802) SpO2:  [100 %] 100 % (11/15 0802) FiO2 (%):  [40 %] 40 % (11/15 0802) Weight:  [96.3 kg (212 lb 4.9 oz)] 96.3 kg (212 lb 4.9 oz) (11/15 0500)  Hemodynamic parameters for last 24 hours:    Intake/Output from previous day: 11/14 0701 - 11/15 0700 In: 4911.3 [I.V.:1996.3; NG/GT:345; IV Piggyback:50; TPN:2520] Out: 4340 [Urine:3190; Drains:50; Stool:1100]  Intake/Output this shift: Total I/O In: 416.2 [I.V.:166.2; NG/GT:30; TPN:220] Out: 110 [Urine:110]  Vent settings for last 24 hours: Vent Mode:  [-] PRVC FiO2 (%):  [40 %] 40 % Set Rate:  [16 bmp] 16 bmp Vt Set:  [550 mL] 550 mL PEEP:  [5 cmH20] 5 cmH20 Plateau Pressure:  [25 cmH20-29 cmH20] 26 cmH20  Physical Exam:  General: alert and no respiratory distress Neuro: alert and nonfocal exam Resp: clear to auscultation bilaterally CVS: regular rate and rhythm, S1, S2 normal, no murmur, click, rub or gallop  GI: Open abdomen with ABRA in place. Extremities: edema 4+ and unequal size  Results for orders placed or performed during the hospital encounter of 05/07/15 (from the past 24 hour(s))  Glucose, capillary     Status: Abnormal   Collection Time: 06/05/15 11:51 AM  Result Value Ref Range   Glucose-Capillary 129 (H) 65 - 99 mg/dL  Glucose, capillary     Status: Abnormal   Collection Time: 06/05/15  3:56 PM  Result Value Ref Range   Glucose-Capillary 136 (H) 65 - 99 mg/dL  Heparin level (unfractionated)     Status: Abnormal   Collection Time: 06/05/15  4:00 PM  Result Value Ref Range   Heparin Unfractionated 0.79 (H) 0.30 - 0.70 IU/mL  Heparin level (unfractionated)     Status: Abnormal   Collection Time: 06/05/15  6:40 PM  Result Value Ref Range   Heparin Unfractionated 0.86 (H) 0.30 - 0.70 IU/mL  Glucose, capillary     Status: Abnormal    Collection Time: 06/05/15  7:46 PM  Result Value Ref Range   Glucose-Capillary 121 (H) 65 - 99 mg/dL  Glucose, capillary     Status: Abnormal   Collection Time: 06/05/15 11:29 PM  Result Value Ref Range   Glucose-Capillary 120 (H) 65 - 99 mg/dL   Comment 1 Notify RN   Glucose, capillary     Status: Abnormal   Collection Time: 06/06/15  3:29 AM  Result Value Ref Range   Glucose-Capillary 121 (H) 65 - 99 mg/dL   Comment 1 Notify RN   CBC     Status: Abnormal   Collection Time: 06/06/15  3:51 AM  Result Value Ref Range   WBC 30.6 (H) 4.0 - 10.5 K/uL   RBC 2.60 (L) 4.22 - 5.81 MIL/uL   Hemoglobin 7.3 (L) 13.0 - 17.0 g/dL   HCT 16.122.7 (L) 09.639.0 - 04.552.0 %   MCV 87.3 78.0 - 100.0 fL   MCH 28.1 26.0 - 34.0 pg   MCHC 32.2 30.0 - 36.0 g/dL   RDW 40.916.2 (H) 81.111.5 - 91.415.5 %   Platelets 691 (H) 150 - 400 K/uL  Heparin level (unfractionated)     Status: None   Collection Time: 06/06/15  3:51 AM  Result Value Ref Range   Heparin Unfractionated 0.59 0.30 - 0.70 IU/mL  Basic metabolic panel     Status: Abnormal   Collection Time: 06/06/15  3:51 AM  Result Value Ref Range   Sodium 133 (L) 135 - 145 mmol/L   Potassium 4.0 3.5 - 5.1 mmol/L   Chloride 95 (L) 101 - 111 mmol/L   CO2 29 22 - 32 mmol/L   Glucose, Bld 125 (H) 65 - 99 mg/dL   BUN 16 6 - 20 mg/dL   Creatinine, Ser 7.820.62 0.61 - 1.24 mg/dL   Calcium 8.3 (L) 8.9 - 10.3 mg/dL   GFR calc non Af Amer >60 >60 mL/min   GFR calc Af Amer >60 >60 mL/min   Anion gap 9 5 - 15  Glucose, capillary     Status: None   Collection Time: 06/06/15  7:36 AM  Result Value Ref Range   Glucose-Capillary 98 65 - 99 mg/dL   Comment 1 Notify RN    Comment 2 Document in Chart      Assessment/Plan:   NEURO  Altered Mental Status:  delirium and sedation   Plan: Wean sedation as is appropriate  PULM  Atelectasis/collapse (focal and bibasilarly)   Plan: Continue to support on the  ventilator.    CARDIO  Sinus Tachycardia   Plan: No specific treatment   RENAL  Urine output and renal function are good.   Plan: Lasix  GI  Open abdomen with ABRA device in place.  Probably to go to the OR tomorrow   Plan: Not sure what we can do about the stoma but will look at it tomorrow in the OR.  ID  Parastomal fistula with drainage.   Plan: Continue Invanz  HEME  Anemia acute blood loss anemia and anemia of critical illness)   Plan: No blood for now  ENDO No specific issues.   Plan: CPM  Global Issues  To the OR tomorrow for potential partial closure, likely removal of the ABRA dynamic fascial closure device and inspection of the ileostomy    LOS: 30 days   Additional comments:I reviewed the patient's new clinical lab test results. cbc/bmety and I reviewed the patients new imaging test results. cxr  Critical Care Total Time*: 412 minutes of critical care management and evaluation.  Danetra Glock 06/06/2015  *Care during the described time interval was provided by me and/or other providers on the critical care team.  I have reviewed this patient's available data, including medical history, events of note, physical examination and test results as part of my evaluation.

## 2015-06-06 NOTE — Anesthesia Preprocedure Evaluation (Signed)
Anesthesia Evaluation  Patient identified by MRN, date of birth, ID band Patient awake    Reviewed: Allergy & Precautions, NPO status , Patient's Chart, lab work & pertinent test results  History of Anesthesia Complications Negative for: history of anesthetic complications  Airway Mallampati: Intubated  TM Distance: >3 FB     Dental no notable dental hx.    Pulmonary neg pulmonary ROS,    Pulmonary exam normal        Cardiovascular negative cardio ROS   Rhythm:Regular     Neuro/Psych negative neurological ROS  negative psych ROS   GI/Hepatic negative GI ROS, Neg liver ROS,   Endo/Other  negative endocrine ROS  Renal/GU negative Renal ROS  negative genitourinary   Musculoskeletal negative musculoskeletal ROS (+)   Abdominal   Peds negative pediatric ROS (+)  Hematology negative hematology ROS (+)   Anesthesia Other Findings   Reproductive/Obstetrics negative OB ROS                             Lab Results  Component Value Date   WBC 30.6* 06/06/2015   HGB 7.3* 06/06/2015   HCT 22.7* 06/06/2015   MCV 87.3 06/06/2015   PLT 691* 06/06/2015   Lab Results  Component Value Date   CREATININE 0.62 06/06/2015   BUN 16 06/06/2015   NA 133* 06/06/2015   K 4.0 06/06/2015   CL 95* 06/06/2015   CO2 29 06/06/2015   Lab Results  Component Value Date   INR 1.20 05/17/2015   INR 1.40 05/09/2015   INR 1.26 05/08/2015     Anesthesia Physical  Anesthesia Plan  ASA: IV  Anesthesia Plan: General   Post-op Pain Management:    Induction: Inhalational  Airway Management Planned: Tracheostomy  Additional Equipment:   Intra-op Plan:   Post-operative Plan: Post-operative intubation/ventilation  Informed Consent: I have reviewed the patients History and Physical, chart, labs and discussed the procedure including the risks, benefits and alternatives for the proposed anesthesia with  the patient or authorized representative who has indicated his/her understanding and acceptance.     Plan Discussed with: CRNA and Surgeon  Anesthesia Plan Comments:         Anesthesia Quick Evaluation
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
## 2015-06-07 ENCOUNTER — Encounter (HOSPITAL_COMMUNITY): Admission: EM | Disposition: A | Discharge status: Rehabilitation Facility | Payer: Self-pay | Source: Home / Self Care

## 2015-06-07 ENCOUNTER — Inpatient Hospital Stay (HOSPITAL_COMMUNITY): Payer: No Typology Code available for payment source | Admitting: Anesthesiology

## 2015-06-07 HISTORY — PX: LAPAROTOMY: SHX154

## 2015-06-07 HISTORY — PX: RESECTION OF ABDOMINAL MASS: SHX6450

## 2015-06-07 HISTORY — PX: ILEOSTOMY: SHX1783

## 2015-06-07 LAB — BASIC METABOLIC PANEL
Anion gap: 8 (ref 5–15)
Anion gap: 9 (ref 5–15)
BUN: 17 mg/dL (ref 6–20)
BUN: 17 mg/dL (ref 6–20)
CO2: 29 mmol/L (ref 22–32)
CO2: 26 mmol/L (ref 22–32)
Calcium: 8.4 mg/dL — ABNORMAL LOW (ref 8.9–10.3)
Calcium: 8.1 mg/dL — ABNORMAL LOW (ref 8.9–10.3)
Chloride: 99 mmol/L — ABNORMAL LOW (ref 101–111)
Chloride: 98 mmol/L — ABNORMAL LOW (ref 101–111)
Creatinine, Ser: 0.59 mg/dL — ABNORMAL LOW (ref 0.61–1.24)
Creatinine, Ser: 0.58 mg/dL — ABNORMAL LOW (ref 0.61–1.24)
GFR calc Af Amer: >60 mL/min (ref >60)
GFR calc Af Amer: >60 mL/min (ref >60)
GFR calc non Af Amer: >60 mL/min (ref >60)
GFR calc non Af Amer: >60 mL/min (ref >60)
Glucose, Bld: 134 mg/dL — ABNORMAL HIGH (ref 65–99)
Glucose, Bld: 138 mg/dL — ABNORMAL HIGH (ref 65–99)
Potassium: 5.1 mmol/L (ref 3.5–5.1)
Potassium: 4.9 mmol/L (ref 3.5–5.1)
Sodium: 136 mmol/L (ref 135–145)
Sodium: 133 mmol/L — ABNORMAL LOW (ref 135–145)

## 2015-06-07 LAB — CBC
HCT: 21.9 % — ABNORMAL LOW (ref 39.0–52.0)
Hemoglobin: 7 g/dL — ABNORMAL LOW (ref 13.0–17.0)
MCH: 28.1 pg (ref 26.0–34.0)
MCHC: 32 g/dL (ref 30.0–36.0)
MCV: 88 fL (ref 78.0–100.0)
Platelets: 689 10*3/uL — ABNORMAL HIGH (ref 150–400)
RBC: 2.49 MIL/uL — ABNORMAL LOW (ref 4.22–5.81)
RDW: 16.4 % — ABNORMAL HIGH (ref 11.5–15.5)
WBC: 34.5 10*3/uL — ABNORMAL HIGH (ref 4.0–10.5)

## 2015-06-07 LAB — HEPARIN LEVEL (UNFRACTIONATED)
Heparin Unfractionated: 0.77 IU/mL — ABNORMAL HIGH (ref 0.30–0.70)
Heparin Unfractionated: 0.43 IU/mL (ref 0.30–0.70)

## 2015-06-07 SURGERY — LAPAROTOMY, EXPLORATORY
Anesthesia: General | Site: Abdomen | Laterality: Right

## 2015-06-07 MED ORDER — STERILE WATER FOR INJECTION IJ SOLN
INTRAMUSCULAR | Status: AC
  Filled 2015-06-07: qty 10

## 2015-06-07 MED ORDER — BACITRACIN ZINC 500 UNIT/GM EX OINT
TOPICAL_OINTMENT | Freq: Two times a day (BID) | CUTANEOUS | Status: DC
  Administered 2015-06-08: 05:00:00 via TOPICAL
  Administered 2015-06-08 – 2015-06-09 (×4): 1 via TOPICAL
  Administered 2015-06-10 – 2015-06-14 (×9): via TOPICAL
  Administered 2015-06-14 – 2015-06-15 (×2): 1 via TOPICAL
  Administered 2015-06-15: 22:00:00 via TOPICAL
  Administered 2015-06-16 – 2015-06-17 (×2): 1 via TOPICAL
  Administered 2015-06-17: 10:00:00 via TOPICAL
  Administered 2015-06-18: 1 via TOPICAL
  Administered 2015-06-18 – 2015-06-19 (×3): via TOPICAL
  Administered 2015-06-20 (×2): 1 via TOPICAL
  Administered 2015-06-21 (×2): via TOPICAL
  Administered 2015-06-22: 1 via TOPICAL
  Administered 2015-06-23 – 2015-06-24 (×2): via TOPICAL
  Administered 2015-06-24 – 2015-06-25 (×2): 1 via TOPICAL
  Administered 2015-06-26 – 2015-07-03 (×13): via TOPICAL
  Filled 2015-06-07 (×5): qty 28.35

## 2015-06-07 MED ORDER — CLINIMIX E/DEXTROSE (5/15) 5 % IV SOLN
INTRAVENOUS | Status: AC
  Administered 2015-06-07: 18:00:00 via INTRAVENOUS
  Filled 2015-06-07: qty 2640

## 2015-06-07 MED ORDER — ROCURONIUM BROMIDE 50 MG/5ML IV SOLN
INTRAVENOUS | Status: AC
  Filled 2015-06-07: qty 1

## 2015-06-07 MED ORDER — PROPOFOL 10 MG/ML IV BOLUS
INTRAVENOUS | Status: AC
  Filled 2015-06-07: qty 20

## 2015-06-07 MED ORDER — VECURONIUM BROMIDE 10 MG IV SOLR
INTRAVENOUS | Status: AC
  Filled 2015-06-07: qty 10

## 2015-06-07 MED ORDER — ROCURONIUM BROMIDE 100 MG/10ML IV SOLN
INTRAVENOUS | Status: DC | PRN
  Administered 2015-06-07: 50 mg via INTRAVENOUS

## 2015-06-07 MED ORDER — SODIUM CHLORIDE 0.9 % IJ SOLN
INTRAMUSCULAR | Status: AC
  Filled 2015-06-07: qty 10

## 2015-06-07 MED ORDER — MIDAZOLAM HCL 2 MG/2ML IJ SOLN
INTRAMUSCULAR | Status: AC
  Filled 2015-06-07: qty 2

## 2015-06-07 MED ORDER — LIDOCAINE HCL (CARDIAC) 20 MG/ML IV SOLN
INTRAVENOUS | Status: AC
  Filled 2015-06-07: qty 5

## 2015-06-07 MED ORDER — MIDAZOLAM HCL 5 MG/5ML IJ SOLN
INTRAMUSCULAR | Status: DC | PRN
  Administered 2015-06-07: 2 mg via INTRAVENOUS

## 2015-06-07 MED ORDER — FENTANYL CITRATE (PF) 100 MCG/2ML IJ SOLN
INTRAMUSCULAR | Status: DC | PRN
  Administered 2015-06-07: 150 ug via INTRAVENOUS
  Administered 2015-06-07: 100 ug via INTRAVENOUS

## 2015-06-07 MED ORDER — VECURONIUM BROMIDE 10 MG IV SOLR
INTRAVENOUS | Status: DC | PRN
  Administered 2015-06-07 (×3): 10 mg via INTRAVENOUS

## 2015-06-07 MED ORDER — CHLORHEXIDINE GLUCONATE 0.12 % MT SOLN
OROMUCOSAL | Status: AC
  Filled 2015-06-07: qty 15

## 2015-06-07 MED ORDER — HEPARIN (PORCINE) IN NACL 100-0.45 UNIT/ML-% IJ SOLN
3000.0000 [IU]/h | INTRAMUSCULAR | Status: DC
  Administered 2015-06-07 – 2015-06-09 (×3): 3200 [IU]/h via INTRAVENOUS
  Filled 2015-06-07 (×11): qty 250

## 2015-06-07 MED ORDER — SUCCINYLCHOLINE CHLORIDE 20 MG/ML IJ SOLN
INTRAMUSCULAR | Status: AC
  Filled 2015-06-07: qty 1

## 2015-06-07 MED ORDER — VECURONIUM BROMIDE 10 MG IV SOLR
INTRAVENOUS | Status: AC
  Filled 2015-06-07: qty 20

## 2015-06-07 MED ORDER — 0.9 % SODIUM CHLORIDE (POUR BTL) OPTIME
TOPICAL | Status: DC | PRN
  Administered 2015-06-07 (×2): 2000 mL
  Administered 2015-06-07: 1000 mL

## 2015-06-07 MED ORDER — FENTANYL CITRATE (PF) 250 MCG/5ML IJ SOLN
INTRAMUSCULAR | Status: AC
  Filled 2015-06-07: qty 5

## 2015-06-07 SURGICAL SUPPLY — 54 items
BARRIER SKIN 2 1/4 (WOUND CARE) ×3 IMPLANT
BARRIER SKIN OD1.75 2 1/4 FLNG (WOUND CARE) IMPLANT
BNDG GAUZE ELAST 4 BULKY (GAUZE/BANDAGES/DRESSINGS) ×1 IMPLANT
BRR ADH 5X3 SEPRAFILM 6 SHT (MISCELLANEOUS)
BRR SKN FLT 1.75X2.25 2 PC (WOUND CARE) ×2
CANISTER SUCTION 2500CC (MISCELLANEOUS) ×3 IMPLANT
CATH MALECOT BARD  28FR (CATHETERS) ×1 IMPLANT
COVER SURGICAL LIGHT HANDLE (MISCELLANEOUS) ×3 IMPLANT
DRAIN PENROSE 1/2X12 LTX STRL (WOUND CARE) ×1 IMPLANT
DRAPE LAPAROSCOPIC ABDOMINAL (DRAPES) ×3 IMPLANT
DRAPE PROXIMA HALF (DRAPES) IMPLANT
DRAPE UTILITY XL STRL (DRAPES) ×5 IMPLANT
DRAPE WARM FLUID 44X44 (DRAPE) ×3 IMPLANT
DRSG OPSITE POSTOP 4X10 (GAUZE/BANDAGES/DRESSINGS) IMPLANT
DRSG OPSITE POSTOP 4X8 (GAUZE/BANDAGES/DRESSINGS) IMPLANT
DRSG PAD ABDOMINAL 8X10 ST (GAUZE/BANDAGES/DRESSINGS) ×1 IMPLANT
ELECT BLADE 6.5 EXT (BLADE) IMPLANT
ELECT CAUTERY BLADE 6.4 (BLADE) ×5 IMPLANT
ELECT REM PT RETURN 9FT ADLT (ELECTROSURGICAL) ×3
ELECTRODE REM PT RTRN 9FT ADLT (ELECTROSURGICAL) ×2 IMPLANT
GAUZE SPONGE 4X4 12PLY STRL (GAUZE/BANDAGES/DRESSINGS) ×2 IMPLANT
GLOVE BIO SURGEON STRL SZ8 (GLOVE) ×2 IMPLANT
GLOVE BIOGEL PI IND STRL 7.5 (GLOVE) IMPLANT
GLOVE BIOGEL PI IND STRL 8 (GLOVE) ×2 IMPLANT
GLOVE BIOGEL PI INDICATOR 7.5 (GLOVE) ×1
GLOVE BIOGEL PI INDICATOR 8 (GLOVE) ×2
GLOVE ECLIPSE 7.5 STRL STRAW (GLOVE) ×4 IMPLANT
GLOVE SURG SS PI 7.5 STRL IVOR (GLOVE) ×1 IMPLANT
GOWN STRL REUS W/ TWL LRG LVL3 (GOWN DISPOSABLE) ×6 IMPLANT
GOWN STRL REUS W/TWL LRG LVL3 (GOWN DISPOSABLE) ×6
KIT BASIN OR (CUSTOM PROCEDURE TRAY) ×3 IMPLANT
KIT ROOM TURNOVER OR (KITS) ×3 IMPLANT
LIGASURE IMPACT 36 18CM CVD LR (INSTRUMENTS) IMPLANT
NS IRRIG 1000ML POUR BTL (IV SOLUTION) ×5 IMPLANT
PACK GENERAL/GYN (CUSTOM PROCEDURE TRAY) ×3 IMPLANT
PAD ARMBOARD 7.5X6 YLW CONV (MISCELLANEOUS) ×3 IMPLANT
PENCIL BUTTON HOLSTER BLD 10FT (ELECTRODE) IMPLANT
SEPRAFILM PROCEDURAL PACK 3X5 (MISCELLANEOUS) IMPLANT
SPECIMEN JAR LARGE (MISCELLANEOUS) IMPLANT
SPONGE GAUZE 4X4 12PLY STER LF (GAUZE/BANDAGES/DRESSINGS) ×1 IMPLANT
SPONGE LAP 18X18 X RAY DECT (DISPOSABLE) IMPLANT
STAPLER VISISTAT 35W (STAPLE) ×3 IMPLANT
SUCTION POOLE TIP (SUCTIONS) ×3 IMPLANT
SUT ETHILON 2 0 FS 18 (SUTURE) ×2 IMPLANT
SUT NOVA 1 T20/GS 25DT (SUTURE) ×3 IMPLANT
SUT PDS AB 1 TP1 96 (SUTURE) ×6 IMPLANT
SUT SILK 2 0 SH CR/8 (SUTURE) ×3 IMPLANT
SUT SILK 2 0 TIES 10X30 (SUTURE) ×3 IMPLANT
SUT SILK 3 0 SH CR/8 (SUTURE) ×3 IMPLANT
SUT SILK 3 0 TIES 10X30 (SUTURE) ×3 IMPLANT
SYR BULB IRRIGATION 50ML (SYRINGE) ×1 IMPLANT
TAPE CLOTH SURG 6X10 WHT LF (GAUZE/BANDAGES/DRESSINGS) ×1 IMPLANT
TOWEL OR 17X26 10 PK STRL BLUE (TOWEL DISPOSABLE) ×4 IMPLANT
YANKAUER SUCT BULB TIP NO VENT (SUCTIONS) ×1 IMPLANT

## 2015-06-07 NOTE — Anesthesia Postprocedure Evaluation (Addendum)
  Anesthesia Post-op Note  Patient: Raymond Bowen  Procedure(s) Performed: Procedure(s): EXPLORATORY LAPAROTOMY with REMOVAL OF ABRA DEVICE (N/A) Complete ABDOMINAL CLOSURE (N/A) ILEOSTOMY Revision (Right)  Patient Location: PACU  Anesthesia Type:General  Level of Consciousness: sedated  Airway and Oxygen Therapy: Patient remains intubated per anesthesia plan  Post-op Pain: none  Post-op Assessment: Post-op Vital signs reviewed LLE Motor Response: Purposeful movement, Responds to commands   RLE Motor Response: Purposeful movement        Post-op Vital Signs: Reviewed  Last Vitals:  Filed Vitals:   06/07/15 1530  BP: 126/83  Pulse:   Temp: 37.7 C  Resp: 24    Complications: No apparent anesthesia complications
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
## 2015-06-07 NOTE — Progress Notes (Signed)
Trach changed at bedside per protocol, also cleared with Dr. Lindie SpruceWyatt.  Changed to #[id number], no complications, spo2 remained stable throughout, +ETCo2 color change, =BBS, good return VT.  RT will continue to monitor.

## 2015-06-07 NOTE — Transfer of Care (Signed)
Immediate Anesthesia Transfer of Care Note  Patient: Elmon ElseBrian XXXCurrie  Procedure(s) Performed: Procedure(s): EXPLORATORY LAPAROTOMY with REMOVAL OF ABRA DEVICE (N/A) Complete ABDOMINAL CLOSURE (N/A) ILEOSTOMY Revision (Right)  Patient Location: ICU  Anesthesia Type:General  Level of Consciousness: unresponsive and Patient remains intubated per anesthesia plan  Airway & Oxygen Therapy: Patient remains intubated per anesthesia plan and Patient placed on Ventilator (see vital sign flow sheet for setting)  Post-op Assessment: Report given to RN and Post -op Vital signs reviewed and stable  Post vital signs: Reviewed and stable  Last Vitals:  Filed Vitals:   06/07/15 0948  BP: 109/68  Pulse: 88  Temp:   Resp: 22    Complications: No apparent anesthesia complications
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
## 2015-06-07 NOTE — Progress Notes (Signed)
RT transported patient to OR with CRNA and RN. Patient was manually ventilated to the OR. CRNA took over manual ventilation until patient was hooked up to the ventilator.

## 2015-06-07 NOTE — Op Note (Addendum)
OPERATIVE REPORT  DATE OF OPERATION:  06/07/2015  PATIENT:  Raymond Bowen  39 y.o. 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 with NPWD   POST-OPERATIVE DIAGNOSIS:  Same with parastomal improved wound  PROCEDURE:  Procedure(s): EXPLORATORY LAPAROTOMY with REMOVAL OF ABRA DEVICE Complete ABDOMINAL CLOSURE ILEOSTOMY Revision Placement of parastomal Penrose drain  SURGEON:  Surgeon(s): Raymond NormanJames Alizabeth Antonio, MD  ASSISTANLeotis Shames: Jeffery, PA-C  ANESTHESIA:   general  EBL: <50 ml  BLOOD ADMINISTERED: none  DRAINS: Penrose drain in the parastomal space, Nasogastric Tube and Urinary Catheter (Foley)   SPECIMEN:  No Specimen  COUNTS CORRECT:  YES  PROCEDURE DETAILS: The patient was taken to the operating room and placed on the table in the supine position. He came to the operating room directly from the intensive care unit, on the ventilator through his tracheostomy tube.  The negative pressure wound dressing on top of his ABRA device was removed and the elastic bands providing tension were removed along with the completely dressing. The dressing on top of his ileostomy on right side was also removed and a significant amount of free fluid of feculent material was expressed from the stomal site.  The patient was primarily prepped with chlorhexidine, and Betadine spray was placed on the midline portion on top of the silicone covering.  A proper timeout was performed identifying the patient and the procedure to be performed.  The silicone tube along with the silicone dressing was removed along with a significant amount of slimy exudate that was seen throughout the peritoneal cavity. All of the detachable exudate was removed but no significant amounts of pus were removed. This was somewhat foul-smelling but not feculent. We irrigated with several liters of warm saline solution. Once this was done attention was placed on the ileostomy for revision and  drainage.  The midline wound was covered. The fascia portion of the stoma could be palpated with the surgeon's finger and on the superior portion of the ostomy site mucosal was still intact with the skin. The previously noted parastomal pocket had decreased in size where the Penrose drain had been placed and was subsequently removed. A 28 French Mallinckrodt tube was passed into the stoma and into the small bowel underneath the fascia. It was secured in place in the superior aspect of the ostomy site using a 2-0 nylon suture. He was cut to an appropriate length to place in the ostomy bag. There is irrigated with saline which demonstrated intra-enteral placement.  A 1/2 inch Penrose drain was placed back into the parastomal pocket, and subsequently brought out a separate stab incision inferior to the ostomy site. It was secured in place with a 2-0 nylon suture.  The surgeon then changes outer gloves and all contaminated devices were removed. We subsequently irrigated with small amounts saline and then we proceeded to close the abdominal fascia using interrupted simple and figure-of-eight stitches of #1 Novafil. It was expected that a mesh material was going to be used however this turned out not to be necessary as we progressively decrease the width of the wound starting from the superior aspect and then subsequently going to the inferior aspect of the wound. The peak airway pressures did not surface above 33 mmHg. We were able to get the abdomen completely closed using the interrupted #1 Novafil sutures.  A sterile wet-to-dry dressing was applied to the midline wound. The abdominal wounds where the ABRA straps had come through the skin were covered with a  sterile dressing.  All needle counts, sponge counts, and instrument counts were correct.  PATIENT DISPOSITION:  ICU--Stable hemodynamically with tracheostomy.   Raymond Bowen 11/16/201611:35 AM

## 2015-06-07 NOTE — Addendum Note (Signed)
Addendum  created 06/07/15 1608 by Marcene Duosobert Markavious Micco, MD   Modules edited: Notes Section   Notes Section:  File: [id number]

## 2015-06-07 NOTE — Progress Notes (Signed)
Asked to see the patient because of what was thought to be seizure activity.  Found to have HR 117, BP elevated to 200/160, extended arms tonically stretched, and eyes which were deviated and rhythmically moving laterally.  Lateral nystagmus.  Given Versed bolus of 5mg .  Currently he is calm, sedated. Pupils ar 2 mm and sluggishly reactive.  No eye deviation and no nystagmus.Abdomen is benign and not changed.  Trach to be changed soon.  May need to get CT of the head soon if this should recur.  Possible a reaction to the anesthetic.  Will watch for now.  Marta LamasJames O. Gae BonWyatt, [name], MD, FACS [phone] Trauma Surgeon

## 2015-06-08 ENCOUNTER — Inpatient Hospital Stay (HOSPITAL_COMMUNITY): Payer: No Typology Code available for payment source

## 2015-06-08 ENCOUNTER — Encounter (HOSPITAL_COMMUNITY): Payer: Self-pay | Admitting: General Surgery

## 2015-06-08 LAB — COMPREHENSIVE METABOLIC PANEL
ALT: 227 U/L — ABNORMAL HIGH (ref 17–63)
AST: 176 U/L — ABNORMAL HIGH (ref 15–41)
Albumin: <1 g/dL — ABNORMAL LOW (ref 3.5–5.0)
Alkaline Phosphatase: 147 U/L — ABNORMAL HIGH (ref 38–126)
Anion gap: 6 (ref 5–15)
BUN: 16 mg/dL (ref 6–20)
CO2: 30 mmol/L (ref 22–32)
Calcium: 8.1 mg/dL — ABNORMAL LOW (ref 8.9–10.3)
Chloride: 98 mmol/L — ABNORMAL LOW (ref 101–111)
Creatinine, Ser: 0.56 mg/dL — ABNORMAL LOW (ref 0.61–1.24)
GFR calc Af Amer: >60 mL/min (ref >60)
GFR calc non Af Amer: >60 mL/min (ref >60)
Glucose, Bld: 154 mg/dL — ABNORMAL HIGH (ref 65–99)
Potassium: 4.8 mmol/L (ref 3.5–5.1)
Sodium: 134 mmol/L — ABNORMAL LOW (ref 135–145)
Total Bilirubin: 2.3 mg/dL — ABNORMAL HIGH (ref 0.3–1.2)
Total Protein: 7.1 g/dL (ref 6.5–8.1)

## 2015-06-08 LAB — CBC
HCT: 21.7 % — ABNORMAL LOW (ref 39.0–52.0)
Hemoglobin: 7.2 g/dL — ABNORMAL LOW (ref 13.0–17.0)
MCH: 28.9 pg (ref 26.0–34.0)
MCHC: 33.2 g/dL (ref 30.0–36.0)
MCV: 87.1 fL (ref 78.0–100.0)
Platelets: 615 10*3/uL — ABNORMAL HIGH (ref 150–400)
RBC: 2.49 MIL/uL — ABNORMAL LOW (ref 4.22–5.81)
RDW: 16.1 % — ABNORMAL HIGH (ref 11.5–15.5)
WBC: 40.8 10*3/uL — ABNORMAL HIGH (ref 4.0–10.5)

## 2015-06-08 LAB — PHOSPHORUS: Phosphorus: 3.7 mg/dL (ref 2.5–4.6)

## 2015-06-08 LAB — MAGNESIUM: Magnesium: 1.8 mg/dL (ref 1.7–2.4)

## 2015-06-08 LAB — HEPARIN LEVEL (UNFRACTIONATED): Heparin Unfractionated: 0.64 IU/mL (ref 0.30–0.70)

## 2015-06-08 MED ORDER — TRACE MINERALS CR-CU-MN-SE-ZN 10-1000-500-60 MCG/ML IV SOLN
INTRAVENOUS | Status: DC
  Filled 2015-06-08: qty 2640

## 2015-06-08 MED ORDER — METOCLOPRAMIDE HCL 5 MG/5ML PO SOLN
10.0000 mg | Freq: Four times a day (QID) | ORAL | Status: DC
  Administered 2015-06-08 – 2015-06-19 (×38): 10 mg via ORAL
  Filled 2015-06-08 (×54): qty 10

## 2015-06-08 MED ORDER — FUROSEMIDE 10 MG/ML IJ SOLN
60.0000 mg | Freq: Two times a day (BID) | INTRAMUSCULAR | Status: DC
  Administered 2015-06-08 – 2015-06-25 (×35): 60 mg via INTRAVENOUS
  Filled 2015-06-08 (×35): qty 6

## 2015-06-08 MED ORDER — M.V.I. ADULT IV INJ
INJECTION | INTRAVENOUS | Status: AC
  Administered 2015-06-08: 17:00:00 via INTRAVENOUS
  Filled 2015-06-08: qty 3000

## 2015-06-08 NOTE — Progress Notes (Signed)
Per MD order, gastric residuals are as follows:  11/16 - 2000 - 275ml 11/17 - 0000 - 400ml 11/17 - 0400 - 450ml

## 2015-06-08 NOTE — Progress Notes (Signed)
SLP Cancellation Note  Patient Details Name: Raymond Bowen XXXCurrie MRN: [id number] DOB: 1975/08/08   Cancelled treatment:       Reason Eval/Treat Not Completed: Medical issues which prohibited therapy. Patient remains fairly sedated, will arouse per RN but difficulty maintaining level of arousal. RN is hopeful that by 11/18 they can begin lifting sedation now that abdomen closed. Will f/u 11.18.   Ferdinand LangoLeah Teaira Croft MA, CCC-SLP [phone]    Ferdinand LangoMcCoy Sheran Newstrom Meryl 06/08/2015, 9:16 AM
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
## 2015-06-09 LAB — CBC
HCT: 21 % — ABNORMAL LOW (ref 39.0–52.0)
Hemoglobin: 6.9 g/dL — CL (ref 13.0–17.0)
MCH: 28.4 pg (ref 26.0–34.0)
MCHC: 32.9 g/dL (ref 30.0–36.0)
MCV: 86.4 fL (ref 78.0–100.0)
Platelets: 657 10*3/uL — ABNORMAL HIGH (ref 150–400)
RBC: 2.43 MIL/uL — ABNORMAL LOW (ref 4.22–5.81)
RDW: 16.4 % — ABNORMAL HIGH (ref 11.5–15.5)
WBC: 30.2 10*3/uL — ABNORMAL HIGH (ref 4.0–10.5)

## 2015-06-09 LAB — HEPARIN LEVEL (UNFRACTIONATED)
Heparin Unfractionated: 0.81 IU/mL — ABNORMAL HIGH (ref 0.30–0.70)
Heparin Unfractionated: 0.77 IU/mL — ABNORMAL HIGH (ref 0.30–0.70)
Heparin Unfractionated: 0.91 IU/mL — ABNORMAL HIGH (ref 0.30–0.70)

## 2015-06-09 LAB — PREPARE RBC (CROSSMATCH)

## 2015-06-09 MED ORDER — WHITE PETROLATUM GEL
Status: AC
Start: 2015-06-09 — End: 2015-06-09
  Administered 2015-06-09: 1
  Filled 2015-06-09: qty 1

## 2015-06-09 MED ORDER — VANCOMYCIN HCL IN DEXTROSE 1-5 GM/200ML-% IV SOLN
1000.0000 mg | Freq: Three times a day (TID) | INTRAVENOUS | Status: AC
  Administered 2015-06-09 – 2015-06-16 (×21): 1000 mg via INTRAVENOUS
  Filled 2015-06-09 (×21): qty 200

## 2015-06-09 MED ORDER — PIPERACILLIN-TAZOBACTAM 3.375 G IVPB
3.3750 g | Freq: Three times a day (TID) | INTRAVENOUS | Status: AC
  Administered 2015-06-09 – 2015-06-16 (×21): 3.375 g via INTRAVENOUS
  Filled 2015-06-09 (×21): qty 50

## 2015-06-09 MED ORDER — TRACE MINERALS CR-CU-MN-SE-ZN 10-1000-500-60 MCG/ML IV SOLN
INTRAVENOUS | Status: AC
  Administered 2015-06-09: 18:00:00 via INTRAVENOUS
  Filled 2015-06-09: qty 2640

## 2015-06-09 MED ORDER — SODIUM CHLORIDE 0.9 % IV SOLN
Freq: Once | INTRAVENOUS | Status: AC
  Administered 2015-06-09: 10 mL via INTRAVENOUS

## 2015-06-09 MED ORDER — HEPARIN (PORCINE) IN NACL 100-0.45 UNIT/ML-% IJ SOLN
2150.0000 [IU]/h | INTRAMUSCULAR | Status: DC
  Administered 2015-06-09 – 2015-06-12 (×7): 2400 [IU]/h via INTRAVENOUS
  Administered 2015-06-13 – 2015-06-16 (×4): 2300 [IU]/h via INTRAVENOUS
  Filled 2015-06-09 (×23): qty 250

## 2015-06-09 MED ORDER — VANCOMYCIN HCL 10 G IV SOLR
1500.0000 mg | Freq: Once | INTRAVENOUS | Status: AC
  Administered 2015-06-09: 1500 mg via INTRAVENOUS
  Filled 2015-06-09: qty 1500

## 2015-06-09 MED ORDER — PIPERACILLIN-TAZOBACTAM 3.375 G IVPB 30 MIN
3.3750 g | Freq: Once | INTRAVENOUS | Status: AC
  Administered 2015-06-09: 3.375 g via INTRAVENOUS
  Filled 2015-06-09: qty 50
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
## 2015-06-09 NOTE — Progress Notes (Signed)
ANTIBIOTIC CONSULT NOTE - INITIAL  Pharmacy Consult for vanc/zosyn Indication: intra-abdominal infxn  Allergies  Allergen Reactions  . Shellfish Allergy Anaphylaxis    Patient Measurements: Height:  (177.8 cm) Weight: 203 lb 14.8 oz (92.5 kg) IBW/kg (Calculated) : 73  Vital Signs: Temp: 101.3 F (38.5 C) (11/18 0838) Temp Source: Oral (11/18 0838) BP: 128/77 mmHg (11/18 1000) Pulse Rate: 120 (11/18 1000) Intake/Output from previous day: 11/17 0701 - 11/18 0700 In: 4606.2 [I.V.:1441.2; NG/GT:315; TPN:2850] Out: 4540 [Urine:3215; Stool:1325] Intake/Output from this shift: Total I/O In: 559.2 [I.V.:139.2; NG/GT:45; TPN:375] Out: 700 [Urine:300; Stool:400]  Labs:  Recent Labs  06/07/15 0435 06/07/15 1830 06/08/15 0354 06/09/15 0710  WBC 34.5*  --  40.8* 30.2*  HGB 7.0*  --  7.2* 6.9*  PLT 689*  --  615* 657*  CREATININE 0.59* 0.58* 0.56*  --    Estimated Creatinine Clearance: 144.5 mL/min (by C-G formula based on Cr of 0.56). No results for input(s): VANCOTROUGH, VANCOPEAK, VANCORANDOM, GENTTROUGH, GENTPEAK, GENTRANDOM, TOBRATROUGH, TOBRAPEAK, TOBRARND, AMIKACINPEAK, AMIKACINTROU, AMIKACIN in the last 72 hours.   Microbiology: Recent Results (from the past 720 hour(s))  Culture, Urine     Status: None   Collection Time: 05/14/15 11:22 AM  Result Value Ref Range Status   Specimen Description URINE, CATHETERIZED  Final   Special Requests NONE  Final   Culture NO GROWTH 1 DAY  Final   Report Status 05/15/2015 FINAL  Final  Culture, expectorated sputum-assessment     Status: None   Collection Time: 05/15/15  8:50 AM  Result Value Ref Range Status   Specimen Description SPUTUM  Final   Special Requests Normal  Final   Sputum evaluation   Final    THIS SPECIMEN IS ACCEPTABLE. RESPIRATORY CULTURE REPORT TO FOLLOW.   Report Status 05/15/2015 FINAL  Final  Culture, respiratory (NON-Expectorated)     Status: None   Collection Time: 05/15/15  8:50 AM  Result  Value Ref Range Status   Specimen Description SPUTUM  Final   Special Requests NONE  Final   Gram Stain   Final    ABUNDANT WBC PRESENT, PREDOMINANTLY PMN RARE SQUAMOUS EPITHELIAL CELLS PRESENT MODERATE GRAM NEGATIVE RODS FEW GRAM POSITIVE COCCI IN PAIRS    Culture   Final    MODERATE PSEUDOMONAS AERUGINOSA MODERATE HAEMOPHILUS INFLUENZAE Note: BETA LACTAMASE NEGATIVE Performed at [facility]    Report Status 05/18/2015 FINAL  Final   Organism ID, Bacteria PSEUDOMONAS AERUGINOSA  Final      Susceptibility   Pseudomonas aeruginosa - MIC*    CEFEPIME 2 SENSITIVE Sensitive     CEFTAZIDIME 4 SENSITIVE Sensitive     CIPROFLOXACIN <=0.25 SENSITIVE Sensitive     GENTAMICIN <=1 SENSITIVE Sensitive     IMIPENEM 1 SENSITIVE Sensitive     PIP/TAZO 8 SENSITIVE Sensitive     TOBRAMYCIN <=1 SENSITIVE Sensitive     * MODERATE PSEUDOMONAS AERUGINOSA  Culture, respiratory (NON-Expectorated)     Status: None   Collection Time: 05/17/15 12:25 PM  Result Value Ref Range Status   Specimen Description TRACHEAL ASPIRATE  Final   Special Requests Normal  Final   Gram Stain   Final    RARE WBC PRESENT, PREDOMINANTLY MONONUCLEAR RARE SQUAMOUS EPITHELIAL CELLS PRESENT NO ORGANISMS SEEN Performed at [facility]    Culture   Final    FEW PSEUDOMONAS AERUGINOSA Performed at [facility]    Report Status 05/20/2015 FINAL  Final   Organism ID, Bacteria PSEUDOMONAS AERUGINOSA  Final      Susceptibility   Pseudomonas aeruginosa - MIC*    CEFEPIME 2 SENSITIVE Sensitive     CEFTAZIDIME 4 SENSITIVE Sensitive     CIPROFLOXACIN <=0.25 SENSITIVE Sensitive     GENTAMICIN <=1 SENSITIVE Sensitive     IMIPENEM 1 SENSITIVE Sensitive     PIP/TAZO 8 SENSITIVE Sensitive     TOBRAMYCIN <=1 SENSITIVE Sensitive     * FEW PSEUDOMONAS AERUGINOSA  Anaerobic culture     Status: None   Collection Time: 05/18/15 12:30 PM  Result Value Ref Range Status   Specimen Description FLUID  PERITONEAL  Final   Special Requests PT ON ERLAPENEM,MEROPENEM,ZOSYN  Final   Gram Stain   Final    FEW WBC PRESENT, PREDOMINANTLY PMN MODERATE GRAM POSITIVE RODS FEW GRAM POSITIVE COCCI IN PAIRS RARE GRAM NEGATIVE RODS    Culture   Final    MIXED ANAEROBIC FLORA PRESENT.  CALL LAB IF FURTHER IID REQUIRED.   Report Status 05/22/2015 FINAL  Final  Body fluid culture     Status: None   Collection Time: 05/18/15 12:30 PM  Result Value Ref Range Status   Specimen Description FLUID PERITONEAL  Final   Special Requests PT ON ERLAPENEM,MEROPENEM,ZOSYN  Final   Gram Stain   Final    FEW WBC PRESENT, PREDOMINANTLY PMN MODERATE GRAM POSITIVE RODS FEW GRAM POSITIVE COCCI IN PAIRS RARE GRAM NEGATIVE RODS    Culture   Final    RARE ESCHERICHIA COLI FEW ENTEROCOCCUS SPECIES RARE PSEUDOMONAS AERUGINOSA    Report Status 05/21/2015 FINAL  Final   Organism ID, Bacteria ESCHERICHIA COLI  Final   Organism ID, Bacteria ENTEROCOCCUS SPECIES  Final   Organism ID, Bacteria PSEUDOMONAS AERUGINOSA  Final      Susceptibility   Escherichia coli - MIC*    AMPICILLIN <=2 SENSITIVE Sensitive     CEFAZOLIN <=4 SENSITIVE Sensitive     CEFEPIME <=1 SENSITIVE Sensitive     CEFTAZIDIME <=1 SENSITIVE Sensitive     CEFTRIAXONE <=1 SENSITIVE Sensitive     CIPROFLOXACIN <=0.25 SENSITIVE Sensitive     GENTAMICIN <=1 SENSITIVE Sensitive     IMIPENEM <=0.25 SENSITIVE Sensitive     TRIMETH/SULFA <=20 SENSITIVE Sensitive     AMPICILLIN/SULBACTAM <=2 SENSITIVE Sensitive     PIP/TAZO <=4 SENSITIVE Sensitive     * RARE ESCHERICHIA COLI   Pseudomonas aeruginosa - MIC*    CEFTAZIDIME 4 SENSITIVE Sensitive     CIPROFLOXACIN <=0.25 SENSITIVE Sensitive     GENTAMICIN <=1 SENSITIVE Sensitive     IMIPENEM 1 SENSITIVE Sensitive     PIP/TAZO 8 SENSITIVE Sensitive     CEFEPIME 4 SENSITIVE Sensitive     * RARE PSEUDOMONAS AERUGINOSA   Enterococcus species - MIC*    AMPICILLIN <=2 SENSITIVE Sensitive     VANCOMYCIN <=0.5  SENSITIVE Sensitive     GENTAMICIN SYNERGY SENSITIVE Sensitive     * FEW ENTEROCOCCUS SPECIES  Culture, blood (routine x 2)     Status: None   Collection Time: 05/25/15  4:19 AM  Result Value Ref Range Status   Specimen Description BLOOD LEFT ANTECUBITAL  Final   Special Requests IN PEDIATRIC BOTTLE 3CC  Final   Culture  Setup Time   Final    GRAM POSITIVE COCCI IN CLUSTERS AEROBIC BOTTLE ONLY CRITICAL RESULT CALLED TO, READ BACK BY AND VERIFIED WITH: KAREN @0208  05/26/15 MKELLY    Culture   Final    STAPHYLOCOCCUS SPECIES (COAGULASE NEGATIVE) THE SIGNIFICANCE OF  ISOLATING THIS ORGANISM FROM A SINGLE SET OF BLOOD CULTURES WHEN MULTIPLE SETS ARE DRAWN IS UNCERTAIN. PLEASE NOTIFY THE MICROBIOLOGY DEPARTMENT WITHIN ONE WEEK IF SPECIATION AND SENSITIVITIES ARE REQUIRED.    Report Status 05/27/2015 FINAL  Final  Culture, blood (routine x 2)     Status: None   Collection Time: 05/25/15  6:21 AM  Result Value Ref Range Status   Specimen Description BLOOD LEFT ANTECUBITAL  Final   Special Requests BOTTLES DRAWN AEROBIC ONLY 5CCS  Final   Culture NO GROWTH 5 DAYS  Final   Report Status 05/30/2015 FINAL  Final  C difficile quick scan w PCR reflex     Status: None   Collection Time: 06/02/15 11:00 AM  Result Value Ref Range Status   C Diff antigen NEGATIVE NEGATIVE Final   C Diff toxin NEGATIVE NEGATIVE Final   C Diff interpretation Negative for toxigenic C. difficile  Final  Urine culture     Status: None   Collection Time: 06/02/15 11:00 AM  Result Value Ref Range Status   Specimen Description URINE, RANDOM  Final   Special Requests NONE  Final   Culture NO GROWTH 1 DAY  Final   Report Status 06/03/2015 FINAL  Final    Medical History: History reviewed. No pertinent past medical history.   Assessment: 39 yom to restart on antibiotics for intra-abdominal infection. Meropenem d/c'd yesterday but should continue on antibiotics per discussion with Dr. Lindie Spruce. Pharmacy consulted to  restart Vanc/Zosyn for intra-abdominal infection. Previously therapeutic on Vanc 1g IV q8h.   S/p abx for pseudomonas pna, UTI, nec fasc. WBC down to 30.2, Tm 101.3; 11/5: D/W Surgery, OK with cont TPN in setting of positive cx. New L PICC placed, D/c R PICC/R IJ per Trauma   Zosyn 10/24>>10/26, 10/29>>11/9; 11/18>> Meropenem 10/25>>10/29; 11/15>>11/17 Invanz 11/11>>11/15 Vanc 11/4 >> 11/10; 11/18>> 11/8 VT : 13 - no change  11/3 BCx: #1/2 (+)Coag Neg Staph 10/27: Peritoneal fluid: Ecoli (pan S), Enterococcus (S amp), Pseudomonas (pan S) 10/24 Sputum - pseudomonas, H.Flu 10/23 Urine - NEG 11/11 c.diff>>neg 11/11 urine>>neg  Goal of Therapy:  Vancomycin trough level 15-20 mcg/ml  Plan:  Vanc 1.5g IV x1; then Vanc 1g IV q8h Zosyn 3.375g IV ( inf); then Zosyn 3.375g IV q8h May still need to add antifungal? Re-culture?  Babs Bertin, PharmD Clinical Pharmacist Pager [phone] 06/09/2015 10:42 AM

## 2015-06-09 NOTE — Progress Notes (Signed)
240 cc gastric residuals at this time
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
## 2015-06-09 NOTE — Progress Notes (Signed)
ANTICOAGULATION CONSULT NOTE - Follow Up Consult  Pharmacy Consult for heparin Indication: DVT   Labs:  Recent Labs  06/07/15 0435 06/07/15 1830  06/08/15 0354 06/09/15 0710 06/09/15 1500 06/09/15 2246  HGB 7.0*  --   --  7.2* 6.9*  --   --   HCT 21.9*  --   --  21.7* 21.0*  --   --   PLT 689*  --   --  615* 657*  --   --   HEPARINUNFRC 0.77*  --   < > 0.64 0.81* 0.77* 0.91*  CREATININE 0.59* 0.58*  --  0.56*  --   --   --   < > = values in this interval not displayed.   Assessment: 39yo male remains supratherapeutic on heparin with higher level despite lower rate; RN reports no signs of bleeding; has required very high heparin rates but has not had stable levels.  Goal of Therapy:  Heparin level 0.3-0.7 units/ml   Plan:  Will decrease heparin gtt by 4 units/kgABW/hr to 2400 units/hr and check level in 6hr.  Vernard GamblesVeronda Alroy Portela, PharmD, BCPS  06/09/2015,11:18 PM
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
## 2015-06-09 NOTE — Progress Notes (Signed)
End tidal changed due to inaccurate reading. RN and MD aware. RT will continue to monitor.
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
## 2015-06-09 NOTE — Progress Notes (Signed)
RT and speech therapy placed inline  PMV. Patient tolerated well. Vital signs were stable throughout. No complications. Patient placed on full support settings. RN aware. RT will continue to monitor.

## 2015-06-10 ENCOUNTER — Inpatient Hospital Stay (HOSPITAL_COMMUNITY): Payer: No Typology Code available for payment source

## 2015-06-10 LAB — CBC WITH DIFFERENTIAL/PLATELET
Basophils Absolute: 0 10*3/uL (ref 0.0–0.1)
Basophils Relative: 0 %
Eosinophils Absolute: 0.8 10*3/uL — ABNORMAL HIGH (ref 0.0–0.7)
Eosinophils Relative: 3 %
HCT: 25.8 % — ABNORMAL LOW (ref 39.0–52.0)
Hemoglobin: 8.3 g/dL — ABNORMAL LOW (ref 13.0–17.0)
Lymphocytes Relative: 15 %
Lymphs Abs: 4 10*3/uL (ref 0.7–4.0)
MCH: 27.3 pg (ref 26.0–34.0)
MCHC: 32.2 g/dL (ref 30.0–36.0)
MCV: 84.9 fL (ref 78.0–100.0)
Monocytes Absolute: 1.9 10*3/uL — ABNORMAL HIGH (ref 0.1–1.0)
Monocytes Relative: 7 %
Neutro Abs: 20.2 10*3/uL — ABNORMAL HIGH (ref 1.7–7.7)
Neutrophils Relative %: 75 %
Platelets: 558 10*3/uL — ABNORMAL HIGH (ref 150–400)
RBC: 3.04 MIL/uL — ABNORMAL LOW (ref 4.22–5.81)
RDW: 16.8 % — ABNORMAL HIGH (ref 11.5–15.5)
WBC: 26.9 10*3/uL — ABNORMAL HIGH (ref 4.0–10.5)

## 2015-06-10 LAB — TYPE AND SCREEN
ABO/RH(D): O POS
Antibody Screen: NEGATIVE
Unit division: 0
Unit division: 0

## 2015-06-10 LAB — BASIC METABOLIC PANEL
Anion gap: 9 (ref 5–15)
BUN: 17 mg/dL (ref 6–20)
CO2: 30 mmol/L (ref 22–32)
Calcium: 8.3 mg/dL — ABNORMAL LOW (ref 8.9–10.3)
Chloride: 95 mmol/L — ABNORMAL LOW (ref 101–111)
Creatinine, Ser: 0.56 mg/dL — ABNORMAL LOW (ref 0.61–1.24)
GFR calc Af Amer: >60 mL/min (ref >60)
GFR calc non Af Amer: >60 mL/min (ref >60)
Glucose, Bld: 120 mg/dL — ABNORMAL HIGH (ref 65–99)
Potassium: 4.1 mmol/L (ref 3.5–5.1)
Sodium: 134 mmol/L — ABNORMAL LOW (ref 135–145)

## 2015-06-10 LAB — VANCOMYCIN, TROUGH: Vancomycin Tr: 17 ug/mL (ref 10.0–20.0)

## 2015-06-10 LAB — PHOSPHORUS: Phosphorus: 4 mg/dL (ref 2.5–4.6)

## 2015-06-10 LAB — HEPARIN LEVEL (UNFRACTIONATED): Heparin Unfractionated: 0.52 IU/mL (ref 0.30–0.70)

## 2015-06-10 LAB — MAGNESIUM: Magnesium: 1.6 mg/dL — ABNORMAL LOW (ref 1.7–2.4)

## 2015-06-10 MED ORDER — MAGNESIUM SULFATE 2 GM/50ML IV SOLN
2.0000 g | Freq: Once | INTRAVENOUS | Status: AC
  Administered 2015-06-10: 2 g via INTRAVENOUS
  Filled 2015-06-10: qty 50

## 2015-06-10 MED ORDER — CHLORHEXIDINE GLUCONATE 0.12 % MT SOLN
15.0000 mL | Freq: Two times a day (BID) | OROMUCOSAL | Status: DC
  Administered 2015-06-10 – 2015-06-25 (×28): 15 mL via OROMUCOSAL
  Filled 2015-06-10 (×15): qty 15

## 2015-06-10 MED ORDER — TRACE MINERALS CR-CU-MN-SE-ZN 10-1000-500-60 MCG/ML IV SOLN
INTRAVENOUS | Status: AC
  Administered 2015-06-10: 18:00:00 via INTRAVENOUS
  Filled 2015-06-10: qty 2880

## 2015-06-10 MED ORDER — FAT EMULSION 20 % IV EMUL
240.0000 mL | INTRAVENOUS | Status: AC
  Administered 2015-06-10: 240 mL via INTRAVENOUS
  Filled 2015-06-10: qty 250

## 2015-06-10 NOTE — Progress Notes (Signed)
ANTIBIOTIC CONSULT NOTE - FOLLOW UP  Pharmacy Consult for vancomycin/Zosyn Indication: r/o intra-abdominal infection  Allergies  Allergen Reactions  . Shellfish Allergy Anaphylaxis    Patient Measurements: Height: 5\' 10"  (177.8 cm) Weight: 195 lb 5.2 oz (88.6 kg) IBW/kg (Calculated) : 73 Vital Signs: Temp: 100.2 F (37.9 C) (11/19 1600) Temp Source: Axillary (11/19 1600) BP: 121/72 mmHg (11/19 1900) Pulse Rate: 102 (11/19 1900) Intake/Output from previous day: 11/18 0701 - 11/19 0700 In: 5280.9 [I.V.:1332.2; Blood:607; NG/GT:90; IV Piggyback:450; TPN:2801.7] Out: 6790 [Urine:5235; Stool:1555] Intake/Output from this shift:    Labs:  Recent Labs  06/08/15 0354 06/09/15 0710 06/10/15 0535  WBC 40.8* 30.2* 26.9*  HGB 7.2* 6.9* 8.3*  PLT 615* 657* 558*  CREATININE 0.56*  --  0.56*   Estimated Creatinine Clearance: 141.6 mL/min (by C-G formula based on Cr of 0.56).  Recent Labs  06/10/15 1835  VANCOTROUGH 17     Microbiology: Recent Results (from the past 720 hour(s))  Culture, Urine     Status: None   Collection Time: 05/14/15 11:22 AM  Result Value Ref Range Status   Specimen Description URINE, CATHETERIZED  Final   Special Requests NONE  Final   Culture NO GROWTH 1 DAY  Final   Report Status 05/15/2015 FINAL  Final  Culture, expectorated sputum-assessment     Status: None   Collection Time: 05/15/15  8:50 AM  Result Value Ref Range Status   Specimen Description SPUTUM  Final   Special Requests Normal  Final   Sputum evaluation   Final    THIS SPECIMEN IS ACCEPTABLE. RESPIRATORY CULTURE REPORT TO FOLLOW.   Report Status 05/15/2015 FINAL  Final  Culture, respiratory (NON-Expectorated)     Status: None   Collection Time: 05/15/15  8:50 AM  Result Value Ref Range Status   Specimen Description SPUTUM  Final   Special Requests NONE  Final   Gram Stain   Final    ABUNDANT WBC PRESENT, PREDOMINANTLY PMN RARE SQUAMOUS EPITHELIAL CELLS PRESENT MODERATE GRAM  NEGATIVE RODS FEW GRAM POSITIVE COCCI IN PAIRS    Culture   Final    MODERATE PSEUDOMONAS AERUGINOSA MODERATE HAEMOPHILUS INFLUENZAE Note: BETA LACTAMASE NEGATIVE Performed at [facility]    Report Status 05/18/2015 FINAL  Final   Organism ID, Bacteria PSEUDOMONAS AERUGINOSA  Final      Susceptibility   Pseudomonas aeruginosa - MIC*    CEFEPIME 2 SENSITIVE Sensitive     CEFTAZIDIME 4 SENSITIVE Sensitive     CIPROFLOXACIN <=0.25 SENSITIVE Sensitive     GENTAMICIN <=1 SENSITIVE Sensitive     IMIPENEM 1 SENSITIVE Sensitive     PIP/TAZO 8 SENSITIVE Sensitive     TOBRAMYCIN <=1 SENSITIVE Sensitive     * MODERATE PSEUDOMONAS AERUGINOSA  Culture, respiratory (NON-Expectorated)     Status: None   Collection Time: 05/17/15 12:25 PM  Result Value Ref Range Status   Specimen Description TRACHEAL ASPIRATE  Final   Special Requests Normal  Final   Gram Stain   Final    RARE WBC PRESENT, PREDOMINANTLY MONONUCLEAR RARE SQUAMOUS EPITHELIAL CELLS PRESENT NO ORGANISMS SEEN Performed at [facility]    Culture   Final    FEW PSEUDOMONAS AERUGINOSA Performed at [facility]    Report Status 05/20/2015 FINAL  Final   Organism ID, Bacteria PSEUDOMONAS AERUGINOSA  Final      Susceptibility   Pseudomonas aeruginosa - MIC*    CEFEPIME 2 SENSITIVE Sensitive     CEFTAZIDIME 4  SENSITIVE Sensitive     CIPROFLOXACIN <=0.25 SENSITIVE Sensitive     GENTAMICIN <=1 SENSITIVE Sensitive     IMIPENEM 1 SENSITIVE Sensitive     PIP/TAZO 8 SENSITIVE Sensitive     TOBRAMYCIN <=1 SENSITIVE Sensitive     * FEW PSEUDOMONAS AERUGINOSA  Anaerobic culture     Status: None   Collection Time: 05/18/15 12:30 PM  Result Value Ref Range Status   Specimen Description FLUID PERITONEAL  Final   Special Requests PT ON ERLAPENEM,MEROPENEM,ZOSYN  Final   Gram Stain   Final    FEW WBC PRESENT, PREDOMINANTLY PMN MODERATE GRAM POSITIVE RODS FEW GRAM POSITIVE COCCI IN PAIRS RARE GRAM NEGATIVE  RODS    Culture   Final    MIXED ANAEROBIC FLORA PRESENT.  CALL LAB IF FURTHER IID REQUIRED.   Report Status 05/22/2015 FINAL  Final  Body fluid culture     Status: None   Collection Time: 05/18/15 12:30 PM  Result Value Ref Range Status   Specimen Description FLUID PERITONEAL  Final   Special Requests PT ON ERLAPENEM,MEROPENEM,ZOSYN  Final   Gram Stain   Final    FEW WBC PRESENT, PREDOMINANTLY PMN MODERATE GRAM POSITIVE RODS FEW GRAM POSITIVE COCCI IN PAIRS RARE GRAM NEGATIVE RODS    Culture   Final    RARE ESCHERICHIA COLI FEW ENTEROCOCCUS SPECIES RARE PSEUDOMONAS AERUGINOSA    Report Status 05/21/2015 FINAL  Final   Organism ID, Bacteria ESCHERICHIA COLI  Final   Organism ID, Bacteria ENTEROCOCCUS SPECIES  Final   Organism ID, Bacteria PSEUDOMONAS AERUGINOSA  Final      Susceptibility   Escherichia coli - MIC*    AMPICILLIN <=2 SENSITIVE Sensitive     CEFAZOLIN <=4 SENSITIVE Sensitive     CEFEPIME <=1 SENSITIVE Sensitive     CEFTAZIDIME <=1 SENSITIVE Sensitive     CEFTRIAXONE <=1 SENSITIVE Sensitive     CIPROFLOXACIN <=0.25 SENSITIVE Sensitive     GENTAMICIN <=1 SENSITIVE Sensitive     IMIPENEM <=0.25 SENSITIVE Sensitive     TRIMETH/SULFA <=20 SENSITIVE Sensitive     AMPICILLIN/SULBACTAM <=2 SENSITIVE Sensitive     PIP/TAZO <=4 SENSITIVE Sensitive     * RARE ESCHERICHIA COLI   Pseudomonas aeruginosa - MIC*    CEFTAZIDIME 4 SENSITIVE Sensitive     CIPROFLOXACIN <=0.25 SENSITIVE Sensitive     GENTAMICIN <=1 SENSITIVE Sensitive     IMIPENEM 1 SENSITIVE Sensitive     PIP/TAZO 8 SENSITIVE Sensitive     CEFEPIME 4 SENSITIVE Sensitive     * RARE PSEUDOMONAS AERUGINOSA   Enterococcus species - MIC*    AMPICILLIN <=2 SENSITIVE Sensitive     VANCOMYCIN <=0.5 SENSITIVE Sensitive     GENTAMICIN SYNERGY SENSITIVE Sensitive     * FEW ENTEROCOCCUS SPECIES  Culture, blood (routine x 2)     Status: None   Collection Time: 05/25/15  4:19 AM  Result Value Ref Range Status    Specimen Description BLOOD LEFT ANTECUBITAL  Final   Special Requests IN PEDIATRIC BOTTLE 3CC  Final   Culture  Setup Time   Final    GRAM POSITIVE COCCI IN CLUSTERS AEROBIC BOTTLE ONLY CRITICAL RESULT CALLED TO, READ BACK BY AND VERIFIED WITH: KAREN  05/26/15 MKELLY    Culture   Final    STAPHYLOCOCCUS SPECIES (COAGULASE NEGATIVE) THE SIGNIFICANCE OF ISOLATING THIS ORGANISM FROM A SINGLE SET OF BLOOD CULTURES WHEN MULTIPLE SETS ARE DRAWN IS UNCERTAIN. PLEASE NOTIFY THE MICROBIOLOGY DEPARTMENT WITHIN ONE WEEK IF  SPECIATION AND SENSITIVITIES ARE REQUIRED.    Report Status 05/27/2015 FINAL  Final  Culture, blood (routine x 2)     Status: None   Collection Time: 05/25/15  6:21 AM  Result Value Ref Range Status   Specimen Description BLOOD LEFT ANTECUBITAL  Final   Special Requests BOTTLES DRAWN AEROBIC ONLY 5CCS  Final   Culture NO GROWTH 5 DAYS  Final   Report Status 05/30/2015 FINAL  Final  C difficile quick scan w PCR reflex     Status: None   Collection Time: 06/02/15 11:00 AM  Result Value Ref Range Status   C Diff antigen NEGATIVE NEGATIVE Final   C Diff toxin NEGATIVE NEGATIVE Final   C Diff interpretation Negative for toxigenic C. difficile  Final  Urine culture     Status: None   Collection Time: 06/02/15 11:00 AM  Result Value Ref Range Status   Specimen Description URINE, RANDOM  Final   Special Requests NONE  Final   Culture NO GROWTH 1 DAY  Final   Report Status 06/03/2015 FINAL  Final    Anti-infectives    Start     Dose/Rate Route Frequency Ordered Stop   06/09/15 2000  vancomycin (VANCOCIN) IVPB 1000 mg/200 mL premix     1,000 mg 200 mL/hr over 60 Minutes Intravenous Every 8 hours 06/09/15 1045     06/09/15 1930  piperacillin-tazobactam (ZOSYN) IVPB 3.375 g     3.375 g 12.5 mL/hr over 240 Minutes Intravenous 3 times per day 06/09/15 1045     06/09/15 1100  piperacillin-tazobactam (ZOSYN) IVPB 3.375 g     3.375 g 100 mL/hr over 30 Minutes Intravenous  Once  06/09/15 1045 06/09/15 1200   06/09/15 1100  vancomycin (VANCOCIN) 1,500 mg in sodium chloride 0.9 % 500 mL IVPB     1,500 mg 250 mL/hr over 120 Minutes Intravenous  Once 06/09/15 1045 06/09/15 1330   06/06/15 1230  meropenem (MERREM) 1 g in sodium chloride 0.9 % 100 mL IVPB  Status:  Discontinued     1 g 200 mL/hr over 30 Minutes Intravenous Every 8 hours 06/06/15 1156 06/08/15 0935   06/02/15 1800  ertapenem (INVANZ) 1 g in sodium chloride 0.9 % 50 mL IVPB  Status:  Discontinued     1 g 100 mL/hr over 30 Minutes Intravenous Every 24 hours 06/02/15 1750 06/06/15 1156   05/26/15 2000  vancomycin (VANCOCIN) IVPB 1000 mg/200 mL premix     1,000 mg 200 mL/hr over 60 Minutes Intravenous Every 8 hours 05/26/15 0755 06/01/15 1233   05/26/15 0830  vancomycin (VANCOCIN) 1,500 mg in sodium chloride 0.9 % 500 mL IVPB     1,500 mg 250 mL/hr over 120 Minutes Intravenous  Once 05/26/15 0743 05/26/15 1021   05/26/15 0730  ertapenem (INVANZ) 1 g in sodium chloride 0.9 % 50 mL IVPB  Status:  Discontinued     1 g 100 mL/hr over 30 Minutes Intravenous Every 24 hours 05/26/15 0729 05/26/15 0741   05/20/15 1330  piperacillin-tazobactam (ZOSYN) IVPB 3.375 g  Status:  Discontinued     3.375 g 12.5 mL/hr over 240 Minutes Intravenous Every 8 hours 05/20/15 1321 05/31/15 1136   05/17/15 1000  ertapenem (INVANZ) 1 g in sodium chloride 0.9 % 50 mL IVPB  Status:  Discontinued     1 g 100 mL/hr over 30 Minutes Intravenous Every 24 hours 05/17/15 0936 05/17/15 0951   05/17/15 1000  meropenem (MERREM) 1 g in sodium chloride 0.9 %  100 mL IVPB  Status:  Discontinued     1 g 200 mL/hr over 30 Minutes Intravenous 3 times per day 05/17/15 0951 05/20/15 1321   05/15/15 0815  piperacillin-tazobactam (ZOSYN) IVPB 3.375 g  Status:  Discontinued     3.375 g 12.5 mL/hr over 240 Minutes Intravenous 3 times per day 05/15/15 0804 05/17/15 0951   05/08/15 0200  ceFAZolin (ANCEF) IVPB 2 g/50 mL premix     2 g 100 mL/hr over 30  Minutes Intravenous 3 times per day 05/08/15 0140 05/09/15 2238   05/08/15 0130  ceFAZolin (ANCEF) powder 2 g  Status:  Discontinued     2 g Other Every 8 hours 05/08/15 0129 05/08/15 0139      Assessment: 39 YOM on vancomycin and Zosyn day # 2 of restart for r/o intra-abdominal infection.   [facility] (drawn 1hr early, true trough 16.5) on 1g IV every 8 hours.   SCr is stable. UOP is great. WBC trending down and Tmax 100.7 in last 24 hours.   Goal of Therapy:  Vancomycin trough level 15-20 mcg/ml Clinical resolution of infection  Plan:  Continue vancomycin 1g IV every 8 hours.  Continue Zosyn 3.375g IV every 8 hours - 4hr infusion.  Follow-up plan for LOT.  Follow-up renal function, clinical status, and culture results.  Weekly vancomycin trough while on therapy to rule out accumulation.  Link SnufferJessica Doris Mcgilvery, PharmD, BCPS Clinical Pharmacist [phone] 06/10/2015,7:35 PM
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
## 2015-06-10 NOTE — Progress Notes (Signed)
ANTICOAGULATION CONSULT NOTE - Follow Up Consult  Pharmacy Consult for heparin Indication: DVT   Labs:  Recent Labs  06/07/15 1830  06/08/15 0354 06/09/15 0710 06/09/15 1500 06/09/15 2246 06/10/15 0535  HGB  --   < > 7.2* 6.9*  --   --  8.3*  HCT  --   --  21.7* 21.0*  --   --  25.8*  PLT  --   --  615* 657*  --   --  558*  HEPARINUNFRC  --   < > 0.64 0.81* 0.77* 0.91* 0.52  CREATININE 0.58*  --  0.56*  --   --   --   --   < > = values in this interval not displayed.   Assessment: 39yo male on heparin for DVT. Heparin level 0.52 (therapeutic) on 2400 units/hr. Hgb low but stable, plt elevated. No signs of bleeding per RN.  Goal of Therapy:  Heparin level 0.3-0.7 units/ml   Plan:  Continue heparin at 2400 units/hr F/u daily heparin level and CBC.  Christoper Fabianaron Nikholas Geffre, PharmD, BCPS Clinical pharmacist, pager [phone] 06/10/2015,6:07 AM

## 2015-06-10 NOTE — Progress Notes (Signed)
Follow up - Trauma and Critical Care  Patient Details:    Raymond Bowen is an 39 y.o. male.  Lines/tubes : PICC Triple Lumen 05/30/15 PICC Left Brachial 44 cm 0 cm (Active)  Indication for Insertion or Continuance of Line Administration of hyperosmolar/irritating solutions (i.e. TPN, Vancomycin, etc.) 06/10/2015  8:00 AM  Exposed Catheter (cm) 0 cm 05/30/2015  9:10 AM  Site Assessment Clean;Dry;Intact 06/10/2015  8:00 AM  Lumen #1 Status Infusing 06/10/2015  8:00 AM  Lumen #2 Status Infusing 06/10/2015  8:00 AM  Lumen #3 Status Infusing 06/10/2015  8:00 AM  Dressing Type Transparent;Occlusive 06/10/2015  8:00 AM  Dressing Status Antimicrobial disc in place;Clean;Dry;Intact 06/10/2015  8:00 AM  Line Care Connections checked and tightened 06/10/2015  8:00 AM  Dressing Intervention New dressing;Dressing changed 06/09/2015 12:50 PM  Dressing Change Due 06/16/15 06/10/2015  8:00 AM     Open Drain Right Abdomen (Active)  Site Description Unremarkable;Other (Comment) 06/09/2015  8:00 PM  Dressing Status Intact 06/09/2015  8:00 PM  Drainage Appearance Other (Comment) 06/07/2015  8:00 AM  Output (mL) 50 mL 06/07/2015  6:00 PM     Open Drain 1 Right RLQ  (Active)  Site Description Unremarkable 06/10/2015  8:00 AM  Dressing Status New drainage;Other (Comment) 06/10/2015  8:00 AM  Drainage Appearance Serosanguineous 06/10/2015  8:00 AM  Status Unclamped 06/10/2015  8:00 AM     Ileostomy RLQ (Active)  Ostomy Pouch 2 piece;Leaking 06/10/2015  8:00 AM  Stoma Assessment Black 06/10/2015  8:00 AM  Peristomal Assessment Intact 06/10/2015  8:00 AM  Treatment Other (Comment) 06/10/2015  6:00 AM  Output (mL) 275 mL 06/10/2015 11:20 AM     Urethral Catheter [name], RN Latex 16 Fr. (Active)  Indication for Insertion or Continuance of Catheter Aggressive IV diuresis;Peri-operative use for selective surgical procedure 06/10/2015  8:00 AM  Site Assessment Clean;Intact 06/10/2015  8:00 AM  Catheter  Maintenance Bag below level of bladder;Catheter secured;Drainage bag/tubing not touching floor;No dependent loops 06/10/2015  8:00 AM  Collection Container Standard drainage bag 06/10/2015  8:00 AM  Securement Method Securing device (Describe) 06/10/2015  8:00 AM  Urinary Catheter Interventions Unclamped 06/10/2015  8:00 AM  Output (mL) 500 mL 05/25/2015  2:26 PM    Microbiology/Sepsis markers: Results for orders placed or performed during the hospital encounter of 05/07/15  MRSA PCR Screening     Status: None   Collection Time: 05/07/15 11:43 PM  Result Value Ref Range Status   MRSA by PCR NEGATIVE NEGATIVE Final    Comment:        The GeneXpert MRSA Assay (FDA approved for NASAL specimens only), is one component of a comprehensive MRSA colonization surveillance program. It is not intended to diagnose MRSA infection nor to guide or monitor treatment for MRSA infections.   Culture, Urine     Status: None   Collection Time: 05/14/15 11:22 AM  Result Value Ref Range Status   Specimen Description URINE, CATHETERIZED  Final   Special Requests NONE  Final   Culture NO GROWTH 1 DAY  Final   Report Status 05/15/2015 FINAL  Final  Culture, expectorated sputum-assessment     Status: None   Collection Time: 05/15/15  8:50 AM  Result Value Ref Range Status   Specimen Description SPUTUM  Final   Special Requests Normal  Final   Sputum evaluation   Final    THIS SPECIMEN IS ACCEPTABLE. RESPIRATORY CULTURE REPORT TO FOLLOW.   Report Status 05/15/2015 FINAL  Final  Culture,  respiratory (NON-Expectorated)     Status: None   Collection Time: 05/15/15  8:50 AM  Result Value Ref Range Status   Specimen Description SPUTUM  Final   Special Requests NONE  Final   Gram Stain   Final    ABUNDANT WBC PRESENT, PREDOMINANTLY PMN RARE SQUAMOUS EPITHELIAL CELLS PRESENT MODERATE GRAM NEGATIVE RODS FEW GRAM POSITIVE COCCI IN PAIRS    Culture   Final    MODERATE PSEUDOMONAS AERUGINOSA MODERATE  HAEMOPHILUS INFLUENZAE Note: BETA LACTAMASE NEGATIVE Performed at [facility]    Report Status 05/18/2015 FINAL  Final   Organism ID, Bacteria PSEUDOMONAS AERUGINOSA  Final      Susceptibility   Pseudomonas aeruginosa - MIC*    CEFEPIME 2 SENSITIVE Sensitive     CEFTAZIDIME 4 SENSITIVE Sensitive     CIPROFLOXACIN <=0.25 SENSITIVE Sensitive     GENTAMICIN <=1 SENSITIVE Sensitive     IMIPENEM 1 SENSITIVE Sensitive     PIP/TAZO 8 SENSITIVE Sensitive     TOBRAMYCIN <=1 SENSITIVE Sensitive     * MODERATE PSEUDOMONAS AERUGINOSA  Culture, respiratory (NON-Expectorated)     Status: None   Collection Time: 05/17/15 12:25 PM  Result Value Ref Range Status   Specimen Description TRACHEAL ASPIRATE  Final   Special Requests Normal  Final   Gram Stain   Final    RARE WBC PRESENT, PREDOMINANTLY MONONUCLEAR RARE SQUAMOUS EPITHELIAL CELLS PRESENT NO ORGANISMS SEEN Performed at [facility]    Culture   Final    FEW PSEUDOMONAS AERUGINOSA Performed at [facility]    Report Status 05/20/2015 FINAL  Final   Organism ID, Bacteria PSEUDOMONAS AERUGINOSA  Final      Susceptibility   Pseudomonas aeruginosa - MIC*    CEFEPIME 2 SENSITIVE Sensitive     CEFTAZIDIME 4 SENSITIVE Sensitive     CIPROFLOXACIN <=0.25 SENSITIVE Sensitive     GENTAMICIN <=1 SENSITIVE Sensitive     IMIPENEM 1 SENSITIVE Sensitive     PIP/TAZO 8 SENSITIVE Sensitive     TOBRAMYCIN <=1 SENSITIVE Sensitive     * FEW PSEUDOMONAS AERUGINOSA  Anaerobic culture     Status: None   Collection Time: 05/18/15 12:30 PM  Result Value Ref Range Status   Specimen Description FLUID PERITONEAL  Final   Special Requests PT ON ERLAPENEM,MEROPENEM,ZOSYN  Final   Gram Stain   Final    FEW WBC PRESENT, PREDOMINANTLY PMN MODERATE GRAM POSITIVE RODS FEW GRAM POSITIVE COCCI IN PAIRS RARE GRAM NEGATIVE RODS    Culture   Final    MIXED ANAEROBIC FLORA PRESENT.  CALL LAB IF FURTHER IID REQUIRED.   Report Status  05/22/2015 FINAL  Final  Body fluid culture     Status: None   Collection Time: 05/18/15 12:30 PM  Result Value Ref Range Status   Specimen Description FLUID PERITONEAL  Final   Special Requests PT ON ERLAPENEM,MEROPENEM,ZOSYN  Final   Gram Stain   Final    FEW WBC PRESENT, PREDOMINANTLY PMN MODERATE GRAM POSITIVE RODS FEW GRAM POSITIVE COCCI IN PAIRS RARE GRAM NEGATIVE RODS    Culture   Final    RARE ESCHERICHIA COLI FEW ENTEROCOCCUS SPECIES RARE PSEUDOMONAS AERUGINOSA    Report Status 05/21/2015 FINAL  Final   Organism ID, Bacteria ESCHERICHIA COLI  Final   Organism ID, Bacteria ENTEROCOCCUS SPECIES  Final   Organism ID, Bacteria PSEUDOMONAS AERUGINOSA  Final      Susceptibility   Escherichia coli - MIC*    AMPICILLIN <=  2 SENSITIVE Sensitive     CEFAZOLIN <=4 SENSITIVE Sensitive     CEFEPIME <=1 SENSITIVE Sensitive     CEFTAZIDIME <=1 SENSITIVE Sensitive     CEFTRIAXONE <=1 SENSITIVE Sensitive     CIPROFLOXACIN <=0.25 SENSITIVE Sensitive     GENTAMICIN <=1 SENSITIVE Sensitive     IMIPENEM <=0.25 SENSITIVE Sensitive     TRIMETH/SULFA <=20 SENSITIVE Sensitive     AMPICILLIN/SULBACTAM <=2 SENSITIVE Sensitive     PIP/TAZO <=4 SENSITIVE Sensitive     * RARE ESCHERICHIA COLI   Pseudomonas aeruginosa - MIC*    CEFTAZIDIME 4 SENSITIVE Sensitive     CIPROFLOXACIN <=0.25 SENSITIVE Sensitive     GENTAMICIN <=1 SENSITIVE Sensitive     IMIPENEM 1 SENSITIVE Sensitive     PIP/TAZO 8 SENSITIVE Sensitive     CEFEPIME 4 SENSITIVE Sensitive     * RARE PSEUDOMONAS AERUGINOSA   Enterococcus species - MIC*    AMPICILLIN <=2 SENSITIVE Sensitive     VANCOMYCIN <=0.5 SENSITIVE Sensitive     GENTAMICIN SYNERGY SENSITIVE Sensitive     * FEW ENTEROCOCCUS SPECIES  Culture, blood (routine x 2)     Status: None   Collection Time: 05/25/15  4:19 AM  Result Value Ref Range Status   Specimen Description BLOOD LEFT ANTECUBITAL  Final   Special Requests IN PEDIATRIC BOTTLE 3CC  Final   Culture   Setup Time   Final    GRAM POSITIVE COCCI IN CLUSTERS AEROBIC BOTTLE ONLY CRITICAL RESULT CALLED TO, READ BACK BY AND VERIFIED WITH: KAREN @0208  05/26/15 MKELLY    Culture   Final    STAPHYLOCOCCUS SPECIES (COAGULASE NEGATIVE) THE SIGNIFICANCE OF ISOLATING THIS ORGANISM FROM A SINGLE SET OF BLOOD CULTURES WHEN MULTIPLE SETS ARE DRAWN IS UNCERTAIN. PLEASE NOTIFY THE MICROBIOLOGY DEPARTMENT WITHIN ONE WEEK IF SPECIATION AND SENSITIVITIES ARE REQUIRED.    Report Status 05/27/2015 FINAL  Final  Culture, blood (routine x 2)     Status: None   Collection Time: 05/25/15  6:21 AM  Result Value Ref Range Status   Specimen Description BLOOD LEFT ANTECUBITAL  Final   Special Requests BOTTLES DRAWN AEROBIC ONLY 5CCS  Final   Culture NO GROWTH 5 DAYS  Final   Report Status 05/30/2015 FINAL  Final  C difficile quick scan w PCR reflex     Status: None   Collection Time: 06/02/15 11:00 AM  Result Value Ref Range Status   C Diff antigen NEGATIVE NEGATIVE Final   C Diff toxin NEGATIVE NEGATIVE Final   C Diff interpretation Negative for toxigenic C. difficile  Final  Urine culture     Status: None   Collection Time: 06/02/15 11:00 AM  Result Value Ref Range Status   Specimen Description URINE, RANDOM  Final   Special Requests NONE  Final   Culture NO GROWTH 1 DAY  Final   Report Status 06/03/2015 FINAL  Final    Anti-infectives:  Anti-infectives    Start     Dose/Rate Route Frequency Ordered Stop   06/09/15 2000  vancomycin (VANCOCIN) IVPB 1000 mg/200 mL premix     1,000 mg 200 mL/hr over 60 Minutes Intravenous Every 8 hours 06/09/15 1045     06/09/15 1930  piperacillin-tazobactam (ZOSYN) IVPB 3.375 g     3.375 g 12.5 mL/hr over 240 Minutes Intravenous 3 times per day 06/09/15 1045     06/09/15 1100  piperacillin-tazobactam (ZOSYN) IVPB 3.375 g     3.375 g 100 mL/hr over 30 Minutes Intravenous  Once 06/09/15 1045 06/09/15 1200   06/09/15 1100  vancomycin (VANCOCIN) 1,500 mg in sodium  chloride 0.9 % 500 mL IVPB     1,500 mg 250 mL/hr over 120 Minutes Intravenous  Once 06/09/15 1045 06/09/15 1330   06/06/15 1230  meropenem (MERREM) 1 g in sodium chloride 0.9 % 100 mL IVPB  Status:  Discontinued     1 g 200 mL/hr over 30 Minutes Intravenous Every 8 hours 06/06/15 1156 06/08/15 0935   06/02/15 1800  ertapenem (INVANZ) 1 g in sodium chloride 0.9 % 50 mL IVPB  Status:  Discontinued     1 g 100 mL/hr over 30 Minutes Intravenous Every 24 hours 06/02/15 1750 06/06/15 1156   05/26/15 2000  vancomycin (VANCOCIN) IVPB 1000 mg/200 mL premix     1,000 mg 200 mL/hr over 60 Minutes Intravenous Every 8 hours 05/26/15 0755 06/01/15 1233   05/26/15 0830  vancomycin (VANCOCIN) 1,500 mg in sodium chloride 0.9 % 500 mL IVPB     1,500 mg 250 mL/hr over 120 Minutes Intravenous  Once 05/26/15 0743 05/26/15 1021   05/26/15 0730  ertapenem (INVANZ) 1 g in sodium chloride 0.9 % 50 mL IVPB  Status:  Discontinued     1 g 100 mL/hr over 30 Minutes Intravenous Every 24 hours 05/26/15 0729 05/26/15 0741   05/20/15 1330  piperacillin-tazobactam (ZOSYN) IVPB 3.375 g  Status:  Discontinued     3.375 g 12.5 mL/hr over 240 Minutes Intravenous Every 8 hours 05/20/15 1321 05/31/15 1136   05/17/15 1000  ertapenem (INVANZ) 1 g in sodium chloride 0.9 % 50 mL IVPB  Status:  Discontinued     1 g 100 mL/hr over 30 Minutes Intravenous Every 24 hours 05/17/15 0936 05/17/15 0951   05/17/15 1000  meropenem (MERREM) 1 g in sodium chloride 0.9 % 100 mL IVPB  Status:  Discontinued     1 g 200 mL/hr over 30 Minutes Intravenous 3 times per day 05/17/15 0951 05/20/15 1321   05/15/15 0815  piperacillin-tazobactam (ZOSYN) IVPB 3.375 g  Status:  Discontinued     3.375 g 12.5 mL/hr over 240 Minutes Intravenous 3 times per day 05/15/15 0804 05/17/15 0951   05/08/15 0200  ceFAZolin (ANCEF) IVPB 2 g/50 mL premix     2 g 100 mL/hr over 30 Minutes Intravenous 3 times per day 05/08/15 0140 05/09/15 2238   05/08/15 0130   ceFAZolin (ANCEF) powder 2 g  Status:  Discontinued     2 g Other Every 8 hours 05/08/15 0129 05/08/15 0139      Best Practice/Protocols:  VTE Prophylaxis: Heparin (drip) and (for known DVT) GI Prophylaxis: Proton Pump Inhibitor Continous Sedation  Consults:      Events:  Subjective:    Overnight Issues: No specific issues overnight.  Has not been able to tolerated tube feedings yet.  Objective:  Vital signs for last 24 hours: Temp:  [98.9 F (37.2 C)-102.5 F (39.2 C)] 100.1 F (37.8 C) (11/19 0839) Pulse Rate:  [80-127] 100 (11/19 1200) Resp:  [9-29] 23 (11/19 1200) BP: (103-139)/(61-90) 133/90 mmHg (11/19 1200) SpO2:  [99 %-100 %] 100 % (11/19 1200) FiO2 (%):  [40 %] 40 % (11/19 1158) Weight:  [88.6 kg (195 lb 5.2 oz)] 88.6 kg (195 lb 5.2 oz) (11/19 0400)  Hemodynamic parameters for last 24 hours:    Intake/Output from previous day: 11/18 0701 - 11/19 0700 In: 5280.9 [I.V.:1332.2; Blood:607; NG/GT:90; IV Piggyback:450; TPN:2801.7] Out: 6790 [Urine:5235; Stool:1555]  Intake/Output this shift:  Total I/O In: 1308 [I.V.:348; IV Piggyback:300; TPN:660] Out: 2565 [Urine:2290; Stool:275]  Vent settings for last 24 hours: Vent Mode:  [-] PRVC FiO2 (%):  [40 %] 40 % Set Rate:  [16 bmp] 16 bmp Vt Set:  [550 mL] 550 mL PEEP:  [5 cmH20] 5 cmH20 Plateau Pressure:  [20 cmH20-24 cmH20] 24 cmH20  Physical Exam:  General: alert and no respiratory distress Neuro: alert, oriented and nonfocal exam Resp: clear to auscultation bilaterally and hiccupping GI: wound clean and wound has excellent granulation tissue, but most of the suture have popped out and the wound is back at about 7 cm open Extremities: edema 3+, unequal size and better  Results for orders placed or performed during the hospital encounter of 05/07/15 (from the past 24 hour(s))  Heparin level (unfractionated)     Status: Abnormal   Collection Time: 06/09/15  3:00 PM  Result Value Ref Range   Heparin  Unfractionated 0.77 (H) 0.30 - 0.70 IU/mL  Heparin level (unfractionated)     Status: Abnormal   Collection Time: 06/09/15 10:46 PM  Result Value Ref Range   Heparin Unfractionated 0.91 (H) 0.30 - 0.70 IU/mL  Basic metabolic panel     Status: Abnormal   Collection Time: 06/10/15  5:35 AM  Result Value Ref Range   Sodium 134 (L) 135 - 145 mmol/L   Potassium 4.1 3.5 - 5.1 mmol/L   Chloride 95 (L) 101 - 111 mmol/L   CO2 30 22 - 32 mmol/L   Glucose, Bld 120 (H) 65 - 99 mg/dL   BUN 17 6 - 20 mg/dL   Creatinine, Ser 1.61 (L) 0.61 - 1.24 mg/dL   Calcium 8.3 (L) 8.9 - 10.3 mg/dL   GFR calc non Af Amer >60 >60 mL/min   GFR calc Af Amer >60 >60 mL/min   Anion gap 9 5 - 15  Magnesium     Status: Abnormal   Collection Time: 06/10/15  5:35 AM  Result Value Ref Range   Magnesium 1.6 (L) 1.7 - 2.4 mg/dL  Phosphorus     Status: None   Collection Time: 06/10/15  5:35 AM  Result Value Ref Range   Phosphorus 4.0 2.5 - 4.6 mg/dL  CBC with Differential/Platelet     Status: Abnormal   Collection Time: 06/10/15  5:35 AM  Result Value Ref Range   WBC 26.9 (H) 4.0 - 10.5 K/uL   RBC 3.04 (L) 4.22 - 5.81 MIL/uL   Hemoglobin 8.3 (L) 13.0 - 17.0 g/dL   HCT 09.6 (L) 04.5 - 40.9 %   MCV 84.9 78.0 - 100.0 fL   MCH 27.3 26.0 - 34.0 pg   MCHC 32.2 30.0 - 36.0 g/dL   RDW 81.1 (H) 91.4 - 78.2 %   Platelets 558 (H) 150 - 400 K/uL   Neutrophils Relative % 75 %   Lymphocytes Relative 15 %   Monocytes Relative 7 %   Eosinophils Relative 3 %   Basophils Relative 0 %   Neutro Abs 20.2 (H) 1.7 - 7.7 K/uL   Lymphs Abs 4.0 0.7 - 4.0 K/uL   Monocytes Absolute 1.9 (H) 0.1 - 1.0 K/uL   Eosinophils Absolute 0.8 (H) 0.0 - 0.7 K/uL   Basophils Absolute 0.0 0.0 - 0.1 K/uL   RBC Morphology POLYCHROMASIA PRESENT    WBC Morphology MILD LEFT SHIFT (1-5% METAS, OCC MYELO, OCC BANDS)   Heparin level (unfractionated)     Status: None   Collection Time: 06/10/15  5:35 AM  Result Value Ref Range   Heparin Unfractionated  0.52 0.30 - 0.70 IU/mL     Assessment/Plan:   NEURO  Altered Mental Status:  delirium   Plan: Mild and manageable  PULM  Atelectasis/collapse (focal and bibasilar)   Plan: Wean on the ventilator as tolerated.  CARDIO  No issues   Plan: CPM  RENAL  Urine output and renal function is good.   Plan: CPM  GI  Abdomen is open again, excellent granulation tissue on the bowel.  Will place VAC at bedisde with mepitel on the bowel on Monday.   Plan: Wound Vac on Monday  ID  No additional infectious problems   Plan: Continue antibiotics  HEME  Anemia acute blood loss anemia)   Plan: Improved with blood.  WBC better  ENDO No specific issues   Plan: CPM  Global Issues  Unfortunately the abdomen has come open again and the is widening of the wound.  Excellent granulation so we should be able to place a VAC with bowel protection.  Will consider restarting tube feedings tomorrow.    LOS: 34 days   Additional comments:I reviewed the patient's new clinical lab test results. cbc/bmet and I reviewed the patients new imaging test results. cxr  Critical Care Total Time*: 30 Minutes  Haeden Hudock 06/10/2015  *Care during the described time interval was provided by me and/or other providers on the critical care team.  I have reviewed this patient's available data, including medical history, events of note, physical examination and test results as part of my evaluation.

## 2015-06-11 LAB — CBC WITH DIFFERENTIAL/PLATELET
Basophils Absolute: 0 10*3/uL (ref 0.0–0.1)
Basophils Relative: 0 %
Eosinophils Absolute: 0.7 10*3/uL (ref 0.0–0.7)
Eosinophils Relative: 3 %
HCT: 25.9 % — ABNORMAL LOW (ref 39.0–52.0)
Hemoglobin: 8.5 g/dL — ABNORMAL LOW (ref 13.0–17.0)
Lymphocytes Relative: 9 %
Lymphs Abs: 2.1 10*3/uL (ref 0.7–4.0)
MCH: 28 pg (ref 26.0–34.0)
MCHC: 32.8 g/dL (ref 30.0–36.0)
MCV: 85.2 fL (ref 78.0–100.0)
Monocytes Absolute: 2.1 10*3/uL — ABNORMAL HIGH (ref 0.1–1.0)
Monocytes Relative: 9 %
Neutro Abs: 18.5 10*3/uL — ABNORMAL HIGH (ref 1.7–7.7)
Neutrophils Relative %: 79 %
Platelets: 577 10*3/uL — ABNORMAL HIGH (ref 150–400)
RBC: 3.04 MIL/uL — ABNORMAL LOW (ref 4.22–5.81)
RDW: 16.7 % — ABNORMAL HIGH (ref 11.5–15.5)
WBC: 23.4 10*3/uL — ABNORMAL HIGH (ref 4.0–10.5)

## 2015-06-11 LAB — MAGNESIUM: Magnesium: 1.8 mg/dL (ref 1.7–2.4)

## 2015-06-11 LAB — TRIGLYCERIDES: Triglycerides: 198 mg/dL — ABNORMAL HIGH (ref <150)

## 2015-06-11 LAB — HEPARIN LEVEL (UNFRACTIONATED): Heparin Unfractionated: 0.43 IU/mL (ref 0.30–0.70)

## 2015-06-11 MED ORDER — TRACE MINERALS CR-CU-MN-SE-ZN 10-1000-500-60 MCG/ML IV SOLN
INTRAVENOUS | Status: AC
  Administered 2015-06-11: 17:00:00 via INTRAVENOUS
  Filled 2015-06-11: qty 2400

## 2015-06-11 MED ORDER — PIVOT 1.5 CAL PO LIQD
1000.0000 mL | ORAL | Status: DC
  Administered 2015-06-11: 1000 mL
  Filled 2015-06-11 (×3): qty 1000

## 2015-06-11 MED ORDER — FAT EMULSION 20 % IV EMUL
240.0000 mL | INTRAVENOUS | Status: AC
  Administered 2015-06-11: 240 mL via INTRAVENOUS
  Filled 2015-06-11: qty 250
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
## 2015-06-11 NOTE — Progress Notes (Signed)
RN called d/t pt w/ apnea- vent in apnea back up mode.  Change pt back to full vent support.  Pt tol change well, VSS HR 83, sat 100%

## 2015-06-11 NOTE — Progress Notes (Signed)
ANTICOAGULATION CONSULT NOTE - Follow Up Consult  Pharmacy Consult for heparin Indication: DVT   Labs:  Recent Labs  06/09/15 0710  06/09/15 2246 06/10/15 0535 06/11/15 0336  HGB 6.9*  --   --  8.3* 8.5*  HCT 21.0*  --   --  25.8* 25.9*  PLT 657*  --   --  558* 577*  HEPARINUNFRC 0.81*  < > 0.91* 0.52 0.43  CREATININE  --   --   --  0.56*  --   < > = values in this interval not displayed.   Assessment: 39yo male on heparin for DVT. Heparin level remains therapeutic on 2400 units/hr. Hgb trending up and stable, plt elevated. No signs of bleeding per RN.  Goal of Therapy:  Heparin level 0.3-0.7 units/ml   Plan:  Continue heparin at 2400 units/hr F/u daily heparin level and CBC.  Link SnufferJessica Angelo Prindle, PharmD, BCPS Clinical Pharmacist [phone]  06/11/2015,10:21 AM
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
## 2015-06-11 NOTE — Progress Notes (Signed)
Noted vent alarming high RR.  RR 40.  Increased ps to 18- RN aware.  Pt appears to be tol well so far, VSS

## 2015-06-11 NOTE — Progress Notes (Signed)
Attempted to wean on cpap/ps 5.  Pt w/ low VT, increased RR and agitation.  Noted pt w/ hiccups also.  Pt tol 12 psv well so far.

## 2015-06-12 LAB — COMPREHENSIVE METABOLIC PANEL
ALT: 190 U/L — ABNORMAL HIGH (ref 17–63)
AST: 105 U/L — ABNORMAL HIGH (ref 15–41)
Albumin: 1.2 g/dL — ABNORMAL LOW (ref 3.5–5.0)
Alkaline Phosphatase: 259 U/L — ABNORMAL HIGH (ref 38–126)
Anion gap: 9 (ref 5–15)
BUN: 18 mg/dL (ref 6–20)
CO2: 29 mmol/L (ref 22–32)
Calcium: 8.6 mg/dL — ABNORMAL LOW (ref 8.9–10.3)
Chloride: 94 mmol/L — ABNORMAL LOW (ref 101–111)
Creatinine, Ser: 0.59 mg/dL — ABNORMAL LOW (ref 0.61–1.24)
GFR calc Af Amer: >60 mL/min (ref >60)
GFR calc non Af Amer: >60 mL/min (ref >60)
Glucose, Bld: 134 mg/dL — ABNORMAL HIGH (ref 65–99)
Potassium: 3.8 mmol/L (ref 3.5–5.1)
Sodium: 132 mmol/L — ABNORMAL LOW (ref 135–145)
Total Bilirubin: 1.4 mg/dL — ABNORMAL HIGH (ref 0.3–1.2)
Total Protein: 7.5 g/dL (ref 6.5–8.1)

## 2015-06-12 LAB — CBC
HCT: 25.3 % — ABNORMAL LOW (ref 39.0–52.0)
Hemoglobin: 8 g/dL — ABNORMAL LOW (ref 13.0–17.0)
MCH: 26.8 pg (ref 26.0–34.0)
MCHC: 31.6 g/dL (ref 30.0–36.0)
MCV: 84.9 fL (ref 78.0–100.0)
Platelets: 558 10*3/uL — ABNORMAL HIGH (ref 150–400)
RBC: 2.98 MIL/uL — ABNORMAL LOW (ref 4.22–5.81)
RDW: 16.7 % — ABNORMAL HIGH (ref 11.5–15.5)
WBC: 22.4 10*3/uL — ABNORMAL HIGH (ref 4.0–10.5)

## 2015-06-12 LAB — DIFFERENTIAL
Basophils Absolute: 0 10*3/uL (ref 0.0–0.1)
Basophils Relative: 0 %
Eosinophils Absolute: 0.7 10*3/uL (ref 0.0–0.7)
Eosinophils Relative: 3 %
Lymphocytes Relative: 15 %
Lymphs Abs: 3.4 10*3/uL (ref 0.7–4.0)
Monocytes Absolute: 1.1 10*3/uL — ABNORMAL HIGH (ref 0.1–1.0)
Monocytes Relative: 5 %
Neutro Abs: 17.2 10*3/uL — ABNORMAL HIGH (ref 1.7–7.7)
Neutrophils Relative %: 77 %

## 2015-06-12 LAB — TRIGLYCERIDES: Triglycerides: 176 mg/dL — ABNORMAL HIGH (ref <150)

## 2015-06-12 LAB — PHOSPHORUS: Phosphorus: 4.6 mg/dL (ref 2.5–4.6)

## 2015-06-12 LAB — PREALBUMIN: Prealbumin: 14 mg/dL — ABNORMAL LOW (ref 18–38)

## 2015-06-12 LAB — HEPARIN LEVEL (UNFRACTIONATED): Heparin Unfractionated: 0.59 IU/mL (ref 0.30–0.70)

## 2015-06-12 LAB — MAGNESIUM: Magnesium: 1.6 mg/dL — ABNORMAL LOW (ref 1.7–2.4)

## 2015-06-12 MED ORDER — DEXMEDETOMIDINE HCL IN NACL 400 MCG/100ML IV SOLN
0.4000 ug/kg/h | INTRAVENOUS | Status: DC
  Administered 2015-06-12 – 2015-06-13 (×2): 0.8 ug/kg/h via INTRAVENOUS
  Administered 2015-06-13: 0.7 ug/kg/h via INTRAVENOUS
  Administered 2015-06-13 – 2015-06-15 (×9): 0.8 ug/kg/h via INTRAVENOUS
  Administered 2015-06-16 (×2): 0.802 ug/kg/h via INTRAVENOUS
  Administered 2015-06-16: 0.9 ug/kg/h via INTRAVENOUS
  Administered 2015-06-17: 0.7 ug/kg/h via INTRAVENOUS
  Administered 2015-06-17: 0.8 ug/kg/h via INTRAVENOUS
  Administered 2015-06-17 (×2): 1 ug/kg/h via INTRAVENOUS
  Filled 2015-06-12 (×21): qty 100

## 2015-06-12 MED ORDER — MAGNESIUM SULFATE 2 GM/50ML IV SOLN
2.0000 g | Freq: Once | INTRAVENOUS | Status: AC
  Administered 2015-06-12: 2 g via INTRAVENOUS
  Filled 2015-06-12: qty 50

## 2015-06-12 MED ORDER — TRACE MINERALS CR-CU-MN-SE-ZN 10-1000-500-60 MCG/ML IV SOLN
INTRAVENOUS | Status: AC
  Administered 2015-06-12: 18:00:00 via INTRAVENOUS
  Filled 2015-06-12: qty 2400
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
## 2015-06-12 NOTE — Progress Notes (Signed)
SLP Cancellation Note  Patient Details Name: Raymond Bowen MRN: [id number] DOB: February 06, 1976   Cancelled treatment:       Reason Eval/Treat Not Completed: Medical issues which prohibited therapy. Discussed patient with RT who reports that patient with poor tolerance of weaning mode at this time (increased anxiety, RR, etc). Made decision to hold in-line PMSV trials at this time. Will f/u via chart review and discussion with team to determine candidacy.   Ferdinand LangoLeah Koralyn Prestage MA, CCC-SLP [phone]    Ferdinand LangoMcCoy Aune Adami Bowen 06/12/2015, 10:42 AM
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
## 2015-06-13 ENCOUNTER — Inpatient Hospital Stay (HOSPITAL_COMMUNITY): Payer: No Typology Code available for payment source

## 2015-06-13 LAB — HEPARIN LEVEL (UNFRACTIONATED): Heparin Unfractionated: 0.69 IU/mL (ref 0.30–0.70)

## 2015-06-13 MED ORDER — ZINC TRACE METAL 1 MG/ML IV SOLN
INTRAVENOUS | Status: AC
  Administered 2015-06-13: 17:00:00 via INTRAVENOUS
  Filled 2015-06-13: qty 2400

## 2015-06-13 MED ORDER — PIVOT 1.5 CAL PO LIQD
1000.0000 mL | ORAL | Status: DC
  Administered 2015-06-13 – 2015-06-16 (×4): 1000 mL
  Filled 2015-06-13 (×4): qty 1000
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
## 2015-06-13 NOTE — Progress Notes (Signed)
RT called to pt room by RN. Pt RR 40+. RT placed pt back on full vent support. RT will continue to monitor.

## 2015-06-13 NOTE — Progress Notes (Signed)
Pt remains on ventilator with sedation; continuing weaning attempts as tolerated.  Wound dehisced after wound closure on 11/17; plan VAC placement today.  Pt continues on TPN and trickle tube feedings.    Pt remains on IV Vanc and Zosyn for wound infection; Heparin drip for DVT.   Will continue to follow as pt progresses.    Quintella BatonJulie W. Nevin Kozuch, RN, BSN  Trauma/Neuro ICU Case Manager [phone]
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
## 2015-06-14 LAB — BASIC METABOLIC PANEL
Anion gap: 8 (ref 5–15)
Anion gap: 6 (ref 5–15)
BUN: 15 mg/dL (ref 6–20)
BUN: 17 mg/dL (ref 6–20)
CO2: 23 mmol/L (ref 22–32)
CO2: 32 mmol/L (ref 22–32)
Calcium: 8.3 mg/dL — ABNORMAL LOW (ref 8.9–10.3)
Calcium: 8.9 mg/dL (ref 8.9–10.3)
Chloride: 88 mmol/L — ABNORMAL LOW (ref 101–111)
Chloride: 96 mmol/L — ABNORMAL LOW (ref 101–111)
Creatinine, Ser: 0.79 mg/dL (ref 0.61–1.24)
Creatinine, Ser: 0.62 mg/dL (ref 0.61–1.24)
GFR calc Af Amer: >60 mL/min (ref >60)
GFR calc Af Amer: >60 mL/min (ref >60)
GFR calc non Af Amer: >60 mL/min (ref >60)
GFR calc non Af Amer: >60 mL/min (ref >60)
Glucose, Bld: 143 mg/dL — ABNORMAL HIGH (ref 65–99)
Potassium: 6.8 mmol/L (ref 3.5–5.1)
Potassium: 4.3 mmol/L (ref 3.5–5.1)
Sodium: 119 mmol/L — CL (ref 135–145)
Sodium: 134 mmol/L — ABNORMAL LOW (ref 135–145)

## 2015-06-14 LAB — CBC
HCT: 26.3 % — ABNORMAL LOW (ref 39.0–52.0)
Hemoglobin: 7.3 g/dL — ABNORMAL LOW (ref 13.0–17.0)
MCH: 27.7 pg (ref 26.0–34.0)
MCHC: 27.8 g/dL — ABNORMAL LOW (ref 30.0–36.0)
MCV: 99.6 fL (ref 78.0–100.0)
Platelets: 503 10*3/uL — ABNORMAL HIGH (ref 150–400)
RBC: 2.64 MIL/uL — ABNORMAL LOW (ref 4.22–5.81)
RDW: 19 % — ABNORMAL HIGH (ref 11.5–15.5)
WBC: 15.8 10*3/uL — ABNORMAL HIGH (ref 4.0–10.5)

## 2015-06-14 LAB — GLUCOSE, CAPILLARY: Glucose-Capillary: 119 mg/dL — ABNORMAL HIGH (ref 65–99)

## 2015-06-14 LAB — PROTIME-INR
INR: 1.23 (ref 0.00–1.49)
Prothrombin Time: 15.6 seconds — ABNORMAL HIGH (ref 11.6–15.2)

## 2015-06-14 LAB — HEPARIN LEVEL (UNFRACTIONATED): Heparin Unfractionated: 0.67 IU/mL (ref 0.30–0.70)

## 2015-06-14 LAB — MAGNESIUM
Magnesium: 2 mg/dL (ref 1.7–2.4)
Magnesium: 1.7 mg/dL (ref 1.7–2.4)

## 2015-06-14 MED ORDER — CLONAZEPAM 1 MG PO TABS
1.0000 mg | ORAL_TABLET | Freq: Two times a day (BID) | ORAL | Status: DC
  Administered 2015-06-14 – 2015-06-16 (×5): 1 mg via ORAL
  Filled 2015-06-14 (×3): qty 1
  Filled 2015-06-14: qty 2
  Filled 2015-06-14: qty 1

## 2015-06-14 MED ORDER — MAGNESIUM SULFATE 2 GM/50ML IV SOLN
2.0000 g | Freq: Once | INTRAVENOUS | Status: AC
  Administered 2015-06-14: 2 g via INTRAVENOUS
  Filled 2015-06-14: qty 50

## 2015-06-14 MED ORDER — CLONAZEPAM 0.1 MG/ML ORAL SUSPENSION
1.0000 mg | Freq: Two times a day (BID) | ORAL | Status: DC
  Filled 2015-06-14: qty 10

## 2015-06-14 MED ORDER — WARFARIN - PHARMACIST DOSING INPATIENT
Freq: Every day | Status: DC
  Administered 2015-06-15 – 2015-06-23 (×6)

## 2015-06-14 MED ORDER — PATIENT'S GUIDE TO USING COUMADIN BOOK
Freq: Once | Status: DC
  Filled 2015-06-14: qty 1

## 2015-06-14 MED ORDER — WARFARIN VIDEO: Freq: Once | Status: DC

## 2015-06-14 MED ORDER — WARFARIN SODIUM 10 MG PO TABS
10.0000 mg | ORAL_TABLET | ORAL | Status: AC
  Administered 2015-06-14: 10 mg via ORAL
  Filled 2015-06-14: qty 1

## 2015-06-14 MED ORDER — FENTANYL 50 MCG/HR TD PT72
100.0000 ug | MEDICATED_PATCH | TRANSDERMAL | Status: DC
  Administered 2015-06-14 – 2015-07-02 (×7): 100 ug via TRANSDERMAL
  Filled 2015-06-14: qty 2
  Filled 2015-06-14 (×3): qty 1
  Filled 2015-06-14: qty 2
  Filled 2015-06-14 (×2): qty 1

## 2015-06-14 MED ORDER — QUETIAPINE FUMARATE 50 MG PO TABS
100.0000 mg | ORAL_TABLET | Freq: Every day | ORAL | Status: DC
  Administered 2015-06-15 – 2015-06-27 (×10): 100 mg via ORAL
  Filled 2015-06-14 (×2): qty 1
  Filled 2015-06-14 (×2): qty 2
  Filled 2015-06-14 (×3): qty 1
  Filled 2015-06-14: qty 2
  Filled 2015-06-14 (×4): qty 1

## 2015-06-14 MED ORDER — TRACE MINERALS CR-CU-MN-SE-ZN 10-1000-500-60 MCG/ML IV SOLN
INTRAVENOUS | Status: AC
  Administered 2015-06-14: 17:00:00 via INTRAVENOUS
  Filled 2015-06-14: qty 1992

## 2015-06-14 MED ORDER — ZINC TRACE METAL 1 MG/ML IV SOLN: INTRAVENOUS | Status: DC
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
## 2015-06-14 NOTE — Progress Notes (Addendum)
Pt agitated, swatting at RN, moving around in bed, very restless, requiring to be repositioned multiple times.  Attempting to get out of bed with legs hanging off side and horizontal in bed multiple times.  Pt pulled cortrak tube partially out.  RN removed cortrak rest of way.  Will notify cortrak for replacement. Sedation meds increased to maintain RASS goal.  Will continue to monitor.   Bosie HelperHandy, Carter Kassel Noel 06/14/2015 11:16 PM

## 2015-06-14 NOTE — Progress Notes (Addendum)
Lab called very deranged bmet results. (including glucose of 1500) bedside CBG 119. Doubt veracity of lab results, discussed with Marchelle Folksmanda in Lab. BMET and Mg redrawn STAT. Will discuss with MD at AM rounds.  New results noted. (8:29)

## 2015-06-15 ENCOUNTER — Inpatient Hospital Stay (HOSPITAL_COMMUNITY): Payer: No Typology Code available for payment source

## 2015-06-15 LAB — COMPREHENSIVE METABOLIC PANEL
ALT: 135 U/L — ABNORMAL HIGH (ref 17–63)
AST: 59 U/L — ABNORMAL HIGH (ref 15–41)
Albumin: 1.6 g/dL — ABNORMAL LOW (ref 3.5–5.0)
Alkaline Phosphatase: 330 U/L — ABNORMAL HIGH (ref 38–126)
Anion gap: 10 (ref 5–15)
BUN: 15 mg/dL (ref 6–20)
CO2: 29 mmol/L (ref 22–32)
Calcium: 8.9 mg/dL (ref 8.9–10.3)
Chloride: 96 mmol/L — ABNORMAL LOW (ref 101–111)
Creatinine, Ser: 0.67 mg/dL (ref 0.61–1.24)
GFR calc Af Amer: >60 mL/min (ref >60)
GFR calc non Af Amer: >60 mL/min (ref >60)
Glucose, Bld: 128 mg/dL — ABNORMAL HIGH (ref 65–99)
Potassium: 3.7 mmol/L (ref 3.5–5.1)
Sodium: 135 mmol/L (ref 135–145)
Total Bilirubin: 1.6 mg/dL — ABNORMAL HIGH (ref 0.3–1.2)
Total Protein: 8.3 g/dL — ABNORMAL HIGH (ref 6.5–8.1)

## 2015-06-15 LAB — PROTIME-INR
INR: 1.28 (ref 0.00–1.49)
Prothrombin Time: 16.1 seconds — ABNORMAL HIGH (ref 11.6–15.2)

## 2015-06-15 LAB — HEPARIN LEVEL (UNFRACTIONATED): Heparin Unfractionated: 0.58 IU/mL (ref 0.30–0.70)

## 2015-06-15 LAB — PHOSPHORUS: Phosphorus: 4 mg/dL (ref 2.5–4.6)

## 2015-06-15 LAB — MAGNESIUM: Magnesium: 1.8 mg/dL (ref 1.7–2.4)

## 2015-06-15 MED ORDER — WARFARIN SODIUM 10 MG PO TABS
10.0000 mg | ORAL_TABLET | Freq: Once | ORAL | Status: AC
  Administered 2015-06-15: 10 mg via ORAL
  Filled 2015-06-15: qty 1

## 2015-06-15 MED ORDER — ZINC TRACE METAL 1 MG/ML IV SOLN
INTRAVENOUS | Status: AC
  Administered 2015-06-15: 18:00:00 via INTRAVENOUS
  Filled 2015-06-15: qty 1992
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
## 2015-06-15 NOTE — Progress Notes (Signed)
RT, RN, and speech therapist in with patient to use inline speaking valve. Cuff deflated, PEEP dropped to 0, valve placed inline, and Pressure Support increased to 18 to maintain previous peak pressures. Patient tolerated well with no complications for 20 minutes. Valve removed, cuff inflated, and patient placed back on PS 12 / CPAP 5.
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
## 2015-06-16 LAB — HEPARIN LEVEL (UNFRACTIONATED)
Heparin Unfractionated: 0.77 IU/mL — ABNORMAL HIGH (ref 0.30–0.70)
Heparin Unfractionated: 0.73 IU/mL — ABNORMAL HIGH (ref 0.30–0.70)
Heparin Unfractionated: 0.57 IU/mL (ref 0.30–0.70)

## 2015-06-16 LAB — PROTIME-INR
INR: 1.22 (ref 0.00–1.49)
Prothrombin Time: 15.6 seconds — ABNORMAL HIGH (ref 11.6–15.2)

## 2015-06-16 MED ORDER — HEPARIN (PORCINE) IN NACL 100-0.45 UNIT/ML-% IJ SOLN
1700.0000 [IU]/h | INTRAMUSCULAR | Status: DC
  Administered 2015-06-16 – 2015-06-18 (×7): 2000 [IU]/h via INTRAVENOUS
  Administered 2015-06-19 (×2): 1900 [IU]/h via INTRAVENOUS
  Administered 2015-06-20: 1800 [IU]/h via INTRAVENOUS
  Administered 2015-06-21 – 2015-06-25 (×7): 1700 [IU]/h via INTRAVENOUS
  Filled 2015-06-16 (×23): qty 250

## 2015-06-16 MED ORDER — ZINC TRACE METAL 1 MG/ML IV SOLN
INTRAVENOUS | Status: AC
  Administered 2015-06-16: 18:00:00 via INTRAVENOUS
  Filled 2015-06-16: qty 1440

## 2015-06-16 MED ORDER — WARFARIN SODIUM 10 MG PO TABS
10.0000 mg | ORAL_TABLET | Freq: Once | ORAL | Status: AC
  Administered 2015-06-16: 10 mg via ORAL
  Filled 2015-06-16: qty 1

## 2015-06-16 MED ORDER — PIVOT 1.5 CAL PO LIQD
1000.0000 mL | ORAL | Status: DC
  Administered 2015-06-17: 1000 mL
  Filled 2015-06-16 (×5): qty 1000
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
## 2015-06-16 NOTE — Progress Notes (Signed)
RT note: RT assisted Speech therapy with PMV while pt. on ATC, tolerated well, moderate sx.'s cleared before cuff deflated, staff at bedside, RT to monitor.
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
## 2015-06-16 NOTE — Progress Notes (Signed)
ANTICOAGULATION CONSULT NOTE - Follow Up Consult  Pharmacy Consult for Heparin  Indication: DVT  Allergies  Allergen Reactions  . Shellfish Allergy Anaphylaxis    Patient Measurements: Height: 5\' 10"  (177.8 cm) Weight: 185 lb 3 oz (84 kg) IBW/kg (Calculated) : 73  Vital Signs: Temp: 99.5 F (37.5 C) (11/25 0400) Temp Source: Axillary (11/25 0400) BP: 133/81 mmHg (11/25 0600) Pulse Rate: 107 (11/25 0600)  Labs:  Recent Labs  06/14/15 0253 06/14/15 0525 06/14/15 0756 06/14/15 1500 06/15/15 0251 06/16/15 0536  HGB  --  7.3*  --   --   --   --   HCT  --  26.3*  --   --   --   --   PLT  --  503*  --   --   --   --   LABPROT  --   --   --  15.6* 16.1*  --   INR  --   --   --  1.23 1.28  --   HEPARINUNFRC 0.67  --   --   --  0.58 0.77*  CREATININE  --  0.79 0.62  --  0.67  --     Estimated Creatinine Clearance: 130.5 mL/min (by C-G formula based on Cr of 0.67).   Assessment: Elevated heparin level, no issues per RN.   Goal of Therapy:  Heparin level 0.3-0.7 units/ml Monitor platelets by anticoagulation protocol: Yes   Plan:  -Reduce heparin drip to 2150 units/hr -1400 HL  Abran DukeLedford, Kyre Jeffries 06/16/2015,6:26 AM
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
## 2015-06-16 NOTE — Progress Notes (Signed)
RT note: Pt. placed to ATC per scheduled plan/protocol, pt. tolerating well, RN made aware, RT to monitor.

## 2015-06-17 LAB — BASIC METABOLIC PANEL
Anion gap: 7 (ref 5–15)
BUN: 15 mg/dL (ref 6–20)
CO2: 31 mmol/L (ref 22–32)
Calcium: 9.1 mg/dL (ref 8.9–10.3)
Chloride: 99 mmol/L — ABNORMAL LOW (ref 101–111)
Creatinine, Ser: 0.68 mg/dL (ref 0.61–1.24)
GFR calc Af Amer: >60 mL/min (ref >60)
GFR calc non Af Amer: >60 mL/min (ref >60)
Glucose, Bld: 112 mg/dL — ABNORMAL HIGH (ref 65–99)
Potassium: 3.4 mmol/L — ABNORMAL LOW (ref 3.5–5.1)
Sodium: 137 mmol/L (ref 135–145)

## 2015-06-17 LAB — CBC
HCT: 27.9 % — ABNORMAL LOW (ref 39.0–52.0)
Hemoglobin: 8.7 g/dL — ABNORMAL LOW (ref 13.0–17.0)
MCH: 26.9 pg (ref 26.0–34.0)
MCHC: 31.2 g/dL (ref 30.0–36.0)
MCV: 86.4 fL (ref 78.0–100.0)
Platelets: 591 10*3/uL — ABNORMAL HIGH (ref 150–400)
RBC: 3.23 MIL/uL — ABNORMAL LOW (ref 4.22–5.81)
RDW: 17 % — ABNORMAL HIGH (ref 11.5–15.5)
WBC: 20.5 10*3/uL — ABNORMAL HIGH (ref 4.0–10.5)

## 2015-06-17 LAB — PHOSPHORUS: Phosphorus: 4.1 mg/dL (ref 2.5–4.6)

## 2015-06-17 LAB — PROTIME-INR
INR: 1.17 (ref 0.00–1.49)
Prothrombin Time: 15.1 seconds (ref 11.6–15.2)

## 2015-06-17 LAB — HEPARIN LEVEL (UNFRACTIONATED): Heparin Unfractionated: 0.41 IU/mL (ref 0.30–0.70)

## 2015-06-17 LAB — MAGNESIUM: Magnesium: 1.6 mg/dL — ABNORMAL LOW (ref 1.7–2.4)

## 2015-06-17 MED ORDER — OXYCODONE HCL 5 MG/5ML PO SOLN
10.0000 mg | ORAL | Status: DC | PRN
  Administered 2015-06-21 – 2015-06-28 (×20): 10 mg via ORAL
  Filled 2015-06-17 (×21): qty 10

## 2015-06-17 MED ORDER — HYDROMORPHONE HCL 1 MG/ML IJ SOLN
1.0000 mg | INTRAMUSCULAR | Status: DC | PRN
  Administered 2015-06-17 – 2015-06-28 (×82): 1 mg via INTRAVENOUS
  Filled 2015-06-17 (×81): qty 1

## 2015-06-17 MED ORDER — HYDROMORPHONE HCL 1 MG/ML IJ SOLN
INTRAMUSCULAR | Status: AC
  Administered 2015-06-17: 1 mg via INTRAVENOUS
  Filled 2015-06-17: qty 1

## 2015-06-17 MED ORDER — MAGNESIUM SULFATE 2 GM/50ML IV SOLN
2.0000 g | Freq: Once | INTRAVENOUS | Status: AC
  Administered 2015-06-17: 2 g via INTRAVENOUS
  Filled 2015-06-17: qty 50

## 2015-06-17 MED ORDER — LORAZEPAM 0.5 MG PO TABS
1.0000 mg | ORAL_TABLET | ORAL | Status: DC | PRN
  Administered 2015-06-24: 1 mg via ORAL
  Administered 2015-06-27 (×3): 2 mg via ORAL
  Administered 2015-06-28: 1 mg via ORAL
  Administered 2015-06-28: 2 mg via ORAL
  Administered 2015-06-28 – 2015-06-29 (×3): 1 mg via ORAL
  Administered 2015-06-30 – 2015-07-03 (×7): 2 mg via ORAL
  Filled 2015-06-17: qty 2
  Filled 2015-06-17 (×5): qty 4
  Filled 2015-06-17 (×2): qty 2
  Filled 2015-06-17 (×6): qty 4
  Filled 2015-06-17: qty 2
  Filled 2015-06-17 (×2): qty 4
  Filled 2015-06-17: qty 2

## 2015-06-17 MED ORDER — POTASSIUM CHLORIDE 10 MEQ/50ML IV SOLN
10.0000 meq | INTRAVENOUS | Status: AC
  Administered 2015-06-17 (×4): 10 meq via INTRAVENOUS
  Filled 2015-06-17 (×4): qty 50

## 2015-06-17 MED ORDER — WARFARIN SODIUM 7.5 MG PO TABS
15.0000 mg | ORAL_TABLET | Freq: Once | ORAL | Status: AC
  Administered 2015-06-17: 15 mg via ORAL
  Filled 2015-06-17: qty 2

## 2015-06-17 MED ORDER — ONDANSETRON HCL 4 MG/2ML IJ SOLN
4.0000 mg | INTRAMUSCULAR | Status: DC | PRN
  Administered 2015-06-17 – 2015-06-27 (×6): 4 mg via INTRAVENOUS
  Filled 2015-06-17 (×6): qty 2

## 2015-06-17 MED ORDER — LORAZEPAM 2 MG/ML IJ SOLN
1.0000 mg | INTRAMUSCULAR | Status: DC | PRN
  Administered 2015-06-17 – 2015-06-19 (×8): 2 mg via INTRAVENOUS
  Administered 2015-06-19: 1 mg via INTRAVENOUS
  Administered 2015-06-19: 2 mg via INTRAVENOUS
  Administered 2015-06-19: 1 mg via INTRAVENOUS
  Administered 2015-06-19 – 2015-06-22 (×13): 2 mg via INTRAVENOUS
  Administered 2015-06-24: 1 mg via INTRAVENOUS
  Administered 2015-06-25: 2 mg via INTRAVENOUS
  Filled 2015-06-17 (×27): qty 1

## 2015-06-17 NOTE — Progress Notes (Addendum)
Patient did not want ATC on, talked to patient about importance of ATC. Placed back on, SPO2 98%. Patient refused to be suctioned but has a good strong productive cough. No distress noted. RN and family at bedside. Breathing treatment started.

## 2015-06-17 NOTE — Progress Notes (Signed)
Notified MD that patient vomited twice since tube feeding was increased to 50 mL/hr.  Orders received to stop feeding for one hour and resume at 40 mL/hr.  Will continue to monitor patient.

## 2015-06-17 NOTE — Progress Notes (Signed)
Follow up - Trauma and Critical Care  Patient Details:    Raymond Bowen is an 39 y.o. male.  Lines/tubes : PICC Triple Lumen 05/30/15 PICC Left Brachial 44 cm 0 cm (Active)  Indication for Insertion or Continuance of Line Administration of hyperosmolar/irritating solutions (i.e. TPN, Vancomycin, etc.);Prolonged intravenous therapies 06/17/2015  8:00 AM  Exposed Catheter (cm) 0 cm 06/12/2015  6:00 PM  Site Assessment Clean;Dry;Intact 06/17/2015  8:00 AM  Lumen #1 Status Infusing 06/17/2015  8:00 AM  Lumen #2 Status Infusing 06/17/2015  8:00 AM  Lumen #3 Status Infusing 06/17/2015  8:00 AM  Dressing Type Transparent;Occlusive;Securing device 06/17/2015  8:00 AM  Dressing Status Clean;Dry;Intact;Antimicrobial disc in place 06/17/2015  8:00 AM  Line Care Connections checked and tightened 06/17/2015  8:00 AM  Dressing Intervention Dressing changed;Antimicrobial disc changed 06/16/2015  5:00 PM  Dressing Change Due 06/23/15 06/17/2015  8:00 AM     Open Drain Right Abdomen (Active)  Site Description Unremarkable 06/17/2015  8:00 AM  Dressing Status None 06/17/2015  8:00 AM  Drainage Appearance Manson Passey;Thin 06/17/2015  8:00 AM  Status Unclamped 06/17/2015  8:00 AM  Output (mL) 75 mL 06/16/2015  6:00 AM     Open Drain 1 Right RLQ  (Active)  Site Description Unremarkable 06/17/2015  8:00 AM  Dressing Status Clean;Dry;Intact 06/17/2015  8:00 AM  Drainage Appearance Serosanguineous 06/16/2015  8:00 AM  Status Unclamped 06/16/2015  8:00 PM     Ileostomy RLQ (Active)  Ostomy Pouch 2 piece 06/17/2015  8:00 AM  Stoma Assessment Pink 06/17/2015  8:00 AM  Peristomal Assessment Intact 06/17/2015  8:00 AM  Treatment Pouch change 06/16/2015  6:00 PM  Output (mL) 250 mL 06/17/2015  6:00 AM     External Urinary Catheter (Active)  Collection Container Standard drainage bag 06/17/2015  8:00 AM  Securement Method Securing device (Describe) 06/17/2015  8:00 AM  Output (mL) 250 mL 06/17/2015  6:00 AM     Microbiology/Sepsis markers: Results for orders placed or performed during the hospital encounter of 05/07/15  MRSA PCR Screening     Status: None   Collection Time: 05/07/15 11:43 PM  Result Value Ref Range Status   MRSA by PCR NEGATIVE NEGATIVE Final    Comment:        The GeneXpert MRSA Assay (FDA approved for NASAL specimens only), is one component of a comprehensive MRSA colonization surveillance program. It is not intended to diagnose MRSA infection nor to guide or monitor treatment for MRSA infections.   Culture, Urine     Status: None   Collection Time: 05/14/15 11:22 AM  Result Value Ref Range Status   Specimen Description URINE, CATHETERIZED  Final   Special Requests NONE  Final   Culture NO GROWTH 1 DAY  Final   Report Status 05/15/2015 FINAL  Final  Culture, expectorated sputum-assessment     Status: None   Collection Time: 05/15/15  8:50 AM  Result Value Ref Range Status   Specimen Description SPUTUM  Final   Special Requests Normal  Final   Sputum evaluation   Final    THIS SPECIMEN IS ACCEPTABLE. RESPIRATORY CULTURE REPORT TO FOLLOW.   Report Status 05/15/2015 FINAL  Final  Culture, respiratory (NON-Expectorated)     Status: None   Collection Time: 05/15/15  8:50 AM  Result Value Ref Range Status   Specimen Description SPUTUM  Final   Special Requests NONE  Final   Gram Stain   Final    ABUNDANT WBC PRESENT, PREDOMINANTLY PMN  RARE SQUAMOUS EPITHELIAL CELLS PRESENT MODERATE GRAM NEGATIVE RODS FEW GRAM POSITIVE COCCI IN PAIRS    Culture   Final    MODERATE PSEUDOMONAS AERUGINOSA MODERATE HAEMOPHILUS INFLUENZAE Note: BETA LACTAMASE NEGATIVE Performed at [facility]    Report Status 05/18/2015 FINAL  Final   Organism ID, Bacteria PSEUDOMONAS AERUGINOSA  Final      Susceptibility   Pseudomonas aeruginosa - MIC*    CEFEPIME 2 SENSITIVE Sensitive     CEFTAZIDIME 4 SENSITIVE Sensitive     CIPROFLOXACIN <=0.25 SENSITIVE Sensitive      GENTAMICIN <=1 SENSITIVE Sensitive     IMIPENEM 1 SENSITIVE Sensitive     PIP/TAZO 8 SENSITIVE Sensitive     TOBRAMYCIN <=1 SENSITIVE Sensitive     * MODERATE PSEUDOMONAS AERUGINOSA  Culture, respiratory (NON-Expectorated)     Status: None   Collection Time: 05/17/15 12:25 PM  Result Value Ref Range Status   Specimen Description TRACHEAL ASPIRATE  Final   Special Requests Normal  Final   Gram Stain   Final    RARE WBC PRESENT, PREDOMINANTLY MONONUCLEAR RARE SQUAMOUS EPITHELIAL CELLS PRESENT NO ORGANISMS SEEN Performed at [facility]    Culture   Final    FEW PSEUDOMONAS AERUGINOSA Performed at [facility]    Report Status 05/20/2015 FINAL  Final   Organism ID, Bacteria PSEUDOMONAS AERUGINOSA  Final      Susceptibility   Pseudomonas aeruginosa - MIC*    CEFEPIME 2 SENSITIVE Sensitive     CEFTAZIDIME 4 SENSITIVE Sensitive     CIPROFLOXACIN <=0.25 SENSITIVE Sensitive     GENTAMICIN <=1 SENSITIVE Sensitive     IMIPENEM 1 SENSITIVE Sensitive     PIP/TAZO 8 SENSITIVE Sensitive     TOBRAMYCIN <=1 SENSITIVE Sensitive     * FEW PSEUDOMONAS AERUGINOSA  Anaerobic culture     Status: None   Collection Time: 05/18/15 12:30 PM  Result Value Ref Range Status   Specimen Description FLUID PERITONEAL  Final   Special Requests PT ON ERLAPENEM,MEROPENEM,ZOSYN  Final   Gram Stain   Final    FEW WBC PRESENT, PREDOMINANTLY PMN MODERATE GRAM POSITIVE RODS FEW GRAM POSITIVE COCCI IN PAIRS RARE GRAM NEGATIVE RODS    Culture   Final    MIXED ANAEROBIC FLORA PRESENT.  CALL LAB IF FURTHER IID REQUIRED.   Report Status 05/22/2015 FINAL  Final  Body fluid culture     Status: None   Collection Time: 05/18/15 12:30 PM  Result Value Ref Range Status   Specimen Description FLUID PERITONEAL  Final   Special Requests PT ON ERLAPENEM,MEROPENEM,ZOSYN  Final   Gram Stain   Final    FEW WBC PRESENT, PREDOMINANTLY PMN MODERATE GRAM POSITIVE RODS FEW GRAM POSITIVE COCCI IN PAIRS RARE  GRAM NEGATIVE RODS    Culture   Final    RARE ESCHERICHIA COLI FEW ENTEROCOCCUS SPECIES RARE PSEUDOMONAS AERUGINOSA    Report Status 05/21/2015 FINAL  Final   Organism ID, Bacteria ESCHERICHIA COLI  Final   Organism ID, Bacteria ENTEROCOCCUS SPECIES  Final   Organism ID, Bacteria PSEUDOMONAS AERUGINOSA  Final      Susceptibility   Escherichia coli - MIC*    AMPICILLIN <=2 SENSITIVE Sensitive     CEFAZOLIN <=4 SENSITIVE Sensitive     CEFEPIME <=1 SENSITIVE Sensitive     CEFTAZIDIME <=1 SENSITIVE Sensitive     CEFTRIAXONE <=1 SENSITIVE Sensitive     CIPROFLOXACIN <=0.25 SENSITIVE Sensitive     GENTAMICIN <=1 SENSITIVE Sensitive  IMIPENEM <=0.25 SENSITIVE Sensitive     TRIMETH/SULFA <=20 SENSITIVE Sensitive     AMPICILLIN/SULBACTAM <=2 SENSITIVE Sensitive     PIP/TAZO <=4 SENSITIVE Sensitive     * RARE ESCHERICHIA COLI   Pseudomonas aeruginosa - MIC*    CEFTAZIDIME 4 SENSITIVE Sensitive     CIPROFLOXACIN <=0.25 SENSITIVE Sensitive     GENTAMICIN <=1 SENSITIVE Sensitive     IMIPENEM 1 SENSITIVE Sensitive     PIP/TAZO 8 SENSITIVE Sensitive     CEFEPIME 4 SENSITIVE Sensitive     * RARE PSEUDOMONAS AERUGINOSA   Enterococcus species - MIC*    AMPICILLIN <=2 SENSITIVE Sensitive     VANCOMYCIN <=0.5 SENSITIVE Sensitive     GENTAMICIN SYNERGY SENSITIVE Sensitive     * FEW ENTEROCOCCUS SPECIES  Culture, blood (routine x 2)     Status: None   Collection Time: 05/25/15  4:19 AM  Result Value Ref Range Status   Specimen Description BLOOD LEFT ANTECUBITAL  Final   Special Requests IN PEDIATRIC BOTTLE 3CC  Final   Culture  Setup Time   Final    GRAM POSITIVE COCCI IN CLUSTERS AEROBIC BOTTLE ONLY CRITICAL RESULT CALLED TO, READ BACK BY AND VERIFIED WITH: KAREN @0208  05/26/15 MKELLY    Culture   Final    STAPHYLOCOCCUS SPECIES (COAGULASE NEGATIVE) THE SIGNIFICANCE OF ISOLATING THIS ORGANISM FROM A SINGLE SET OF BLOOD CULTURES WHEN MULTIPLE SETS ARE DRAWN IS UNCERTAIN. PLEASE NOTIFY  THE MICROBIOLOGY DEPARTMENT WITHIN ONE WEEK IF SPECIATION AND SENSITIVITIES ARE REQUIRED.    Report Status 05/27/2015 FINAL  Final  Culture, blood (routine x 2)     Status: None   Collection Time: 05/25/15  6:21 AM  Result Value Ref Range Status   Specimen Description BLOOD LEFT ANTECUBITAL  Final   Special Requests BOTTLES DRAWN AEROBIC ONLY 5CCS  Final   Culture NO GROWTH 5 DAYS  Final   Report Status 05/30/2015 FINAL  Final  C difficile quick scan w PCR reflex     Status: None   Collection Time: 06/02/15 11:00 AM  Result Value Ref Range Status   C Diff antigen NEGATIVE NEGATIVE Final   C Diff toxin NEGATIVE NEGATIVE Final   C Diff interpretation Negative for toxigenic C. difficile  Final  Urine culture     Status: None   Collection Time: 06/02/15 11:00 AM  Result Value Ref Range Status   Specimen Description URINE, RANDOM  Final   Special Requests NONE  Final   Culture NO GROWTH 1 DAY  Final   Report Status 06/03/2015 FINAL  Final    Anti-infectives:  Anti-infectives    Start     Dose/Rate Route Frequency Ordered Stop   06/09/15 2000  vancomycin (VANCOCIN) IVPB 1000 mg/200 mL premix     1,000 mg 200 mL/hr over 60 Minutes Intravenous Every 8 hours 06/09/15 1045 06/16/15 1308   06/09/15 1930  piperacillin-tazobactam (ZOSYN) IVPB 3.375 g     3.375 g 12.5 mL/hr over 240 Minutes Intravenous 3 times per day 06/09/15 1045 06/16/15 1838   06/09/15 1100  piperacillin-tazobactam (ZOSYN) IVPB 3.375 g     3.375 g 100 mL/hr over 30 Minutes Intravenous  Once 06/09/15 1045 06/09/15 1200   06/09/15 1100  vancomycin (VANCOCIN) 1,500 mg in sodium chloride 0.9 % 500 mL IVPB     1,500 mg 250 mL/hr over 120 Minutes Intravenous  Once 06/09/15 1045 06/09/15 1330   06/06/15 1230  meropenem (MERREM) 1 g in sodium chloride 0.9 %  100 mL IVPB  Status:  Discontinued     1 g 200 mL/hr over 30 Minutes Intravenous Every 8 hours 06/06/15 1156 06/08/15 0935   06/02/15 1800  ertapenem (INVANZ) 1 g in  sodium chloride 0.9 % 50 mL IVPB  Status:  Discontinued     1 g 100 mL/hr over 30 Minutes Intravenous Every 24 hours 06/02/15 1750 06/06/15 1156   05/26/15 2000  vancomycin (VANCOCIN) IVPB 1000 mg/200 mL premix     1,000 mg 200 mL/hr over 60 Minutes Intravenous Every 8 hours 05/26/15 0755 06/01/15 1233   05/26/15 0830  vancomycin (VANCOCIN) 1,500 mg in sodium chloride 0.9 % 500 mL IVPB     1,500 mg 250 mL/hr over 120 Minutes Intravenous  Once 05/26/15 0743 05/26/15 1021   05/26/15 0730  ertapenem (INVANZ) 1 g in sodium chloride 0.9 % 50 mL IVPB  Status:  Discontinued     1 g 100 mL/hr over 30 Minutes Intravenous Every 24 hours 05/26/15 0729 05/26/15 0741   05/20/15 1330  piperacillin-tazobactam (ZOSYN) IVPB 3.375 g  Status:  Discontinued     3.375 g 12.5 mL/hr over 240 Minutes Intravenous Every 8 hours 05/20/15 1321 05/31/15 1136   05/17/15 1000  ertapenem (INVANZ) 1 g in sodium chloride 0.9 % 50 mL IVPB  Status:  Discontinued     1 g 100 mL/hr over 30 Minutes Intravenous Every 24 hours 05/17/15 0936 05/17/15 0951   05/17/15 1000  meropenem (MERREM) 1 g in sodium chloride 0.9 % 100 mL IVPB  Status:  Discontinued     1 g 200 mL/hr over 30 Minutes Intravenous 3 times per day 05/17/15 0951 05/20/15 1321   05/15/15 0815  piperacillin-tazobactam (ZOSYN) IVPB 3.375 g  Status:  Discontinued     3.375 g 12.5 mL/hr over 240 Minutes Intravenous 3 times per day 05/15/15 0804 05/17/15 0951   05/08/15 0200  ceFAZolin (ANCEF) IVPB 2 g/50 mL premix     2 g 100 mL/hr over 30 Minutes Intravenous 3 times per day 05/08/15 0140 05/09/15 2238   05/08/15 0130  ceFAZolin (ANCEF) powder 2 g  Status:  Discontinued     2 g Other Every 8 hours 05/08/15 0129 05/08/15 0139      Best Practice/Protocols:  VTE Prophylaxis: Heparin (drip)   Consults:      Events:  Subjective:    Overnight Issues: Trach collar overnight, gets anxious and moving a lot  Objective:  Vital signs for last 24 hours: Temp:   [98 F (36.7 C)-100.2 F (37.9 C)] 100.2 F (37.9 C) (11/26 0800) Pulse Rate:  [85-131] 104 (11/26 0816) Resp:  [18-35] 24 (11/26 0816) BP: (97-133)/(52-86) 115/81 mmHg (11/26 0816) SpO2:  [96 %-100 %] 100 % (11/26 0816) FiO2 (%):  [28 %-35 %] 28 % (11/26 0730) Weight:  [82.5 kg (181 lb 14.1 oz)] 82.5 kg (181 lb 14.1 oz) (11/26 0618)  Hemodynamic parameters for last 24 hours:    Intake/Output from previous day: 11/25 0701 - 11/26 0700 In: 4852.2 [I.V.:1246.8; NG/GT:918.6; IV Piggyback:1000; TPN:1686.8] Out: 4335 [Urine:3320; Drains:190; Stool:825]  Intake/Output this shift: Total I/O In: 246.3 [I.V.:46.3; NG/GT:40; IV Piggyback:100; TPN:60] Out: -   Vent settings for last 24 hours: Vent Mode:  [-]  FiO2 (%):  [28 %-35 %] 28 %  Physical Exam:  Gen: NAD HEENT: trach in place Resp: coarse b/l Cardiovascular: RRR Abdomen: soft, ostomy with good output, minimal drainage from fistula, vac in place Ext: +edema RLE Neuro: GCS 11t  Results  for orders placed or performed during the hospital encounter of 05/07/15 (from the past 24 hour(s))  Heparin level (unfractionated)     Status: Abnormal   Collection Time: 06/16/15  2:45 PM  Result Value Ref Range   Heparin Unfractionated 0.73 (H) 0.30 - 0.70 IU/mL  Heparin level (unfractionated)     Status: None   Collection Time: 06/16/15  9:52 PM  Result Value Ref Range   Heparin Unfractionated 0.57 0.30 - 0.70 IU/mL  Heparin level (unfractionated)     Status: None   Collection Time: 06/17/15  5:01 AM  Result Value Ref Range   Heparin Unfractionated 0.41 0.30 - 0.70 IU/mL  Basic metabolic panel     Status: Abnormal   Collection Time: 06/17/15  5:01 AM  Result Value Ref Range   Sodium 137 135 - 145 mmol/L   Potassium 3.4 (L) 3.5 - 5.1 mmol/L   Chloride 99 (L) 101 - 111 mmol/L   CO2 31 22 - 32 mmol/L   Glucose, Bld 112 (H) 65 - 99 mg/dL   BUN 15 6 - 20 mg/dL   Creatinine, Ser 2.130.68 0.61 - 1.24 mg/dL   Calcium 9.1 8.9 - 08.610.3 mg/dL    GFR calc non Af Amer >60 >60 mL/min   GFR calc Af Amer >60 >60 mL/min   Anion gap 7 5 - 15  Magnesium     Status: Abnormal   Collection Time: 06/17/15  5:01 AM  Result Value Ref Range   Magnesium 1.6 (L) 1.7 - 2.4 mg/dL  Phosphorus     Status: None   Collection Time: 06/17/15  5:01 AM  Result Value Ref Range   Phosphorus 4.1 2.5 - 4.6 mg/dL  Protime-INR     Status: None   Collection Time: 06/17/15  5:01 AM  Result Value Ref Range   Prothrombin Time 15.1 11.6 - 15.2 seconds   INR 1.17 0.00 - 1.49     Assessment/Plan:   NEURO  anxiety   Plan: change to per tube ativan, and PRN pushes, dc fentanyl and versed  PULM  Trach dependent   Plan: continue lasix, continue trach collar  CARDIO  No issues   Plan: continue FEN  RENAL  Electrolyte abnormalities   Plan: continue replace per protocol  GI  Open abdomen   Plan: advance TF to gaol, change vac this afternoon  ID  No issues   Plan: trach care, good cough  HEME  DVT RLE   Plan: cont hep drip  ENDO No issues   Plan:   Global Issues      LOS: 41 days   Additional comments:  Critical Care Total Time*: 30 Minutes  De BlanchLuke Aaron Kinsinger 06/17/2015  *Care during the described time interval was provided by me and/or other providers on the critical care team.  I have reviewed this patient's available data, including medical history, events of note, physical examination and test results as part of my evaluation.
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
## 2015-06-17 NOTE — Progress Notes (Signed)
Patient has good strong cough. Non-productive. BBS are clear and diminished in the bases. RN at bedside. SPO2 97%. No distress noted. 28% ATC. Tolerating well.

## 2015-06-17 NOTE — Progress Notes (Signed)
Patient continues to vomit.  MD notified.  Orders received for IV zofran 4mg  every 4 hours PRN and to not continue tube feeding.  Will continue to monitor patient.
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
## 2015-06-18 ENCOUNTER — Inpatient Hospital Stay (HOSPITAL_COMMUNITY): Payer: No Typology Code available for payment source

## 2015-06-18 LAB — URINE MICROSCOPIC-ADD ON
Bacteria, UA: NONE SEEN
RBC / HPF: NONE SEEN RBC/hpf (ref 0–5)

## 2015-06-18 LAB — URINALYSIS, ROUTINE W REFLEX MICROSCOPIC
Bilirubin Urine: NEGATIVE
Glucose, UA: NEGATIVE mg/dL
Hgb urine dipstick: NEGATIVE
Ketones, ur: NEGATIVE mg/dL
Leukocytes, UA: NEGATIVE
Nitrite: NEGATIVE
Protein, ur: 30 mg/dL — AB
Specific Gravity, Urine: 1.013 (ref 1.005–1.030)
pH: 8 (ref 5.0–8.0)

## 2015-06-18 LAB — CBC
HCT: 29.1 % — ABNORMAL LOW (ref 39.0–52.0)
Hemoglobin: 9.3 g/dL — ABNORMAL LOW (ref 13.0–17.0)
MCH: 27.7 pg (ref 26.0–34.0)
MCHC: 32 g/dL (ref 30.0–36.0)
MCV: 86.6 fL (ref 78.0–100.0)
Platelets: 603 10*3/uL — ABNORMAL HIGH (ref 150–400)
RBC: 3.36 MIL/uL — ABNORMAL LOW (ref 4.22–5.81)
RDW: 17.4 % — ABNORMAL HIGH (ref 11.5–15.5)
WBC: 26 10*3/uL — ABNORMAL HIGH (ref 4.0–10.5)

## 2015-06-18 LAB — PROTIME-INR
INR: 1.29 (ref 0.00–1.49)
Prothrombin Time: 16.2 seconds — ABNORMAL HIGH (ref 11.6–15.2)

## 2015-06-18 LAB — HEPARIN LEVEL (UNFRACTIONATED): Heparin Unfractionated: 0.55 IU/mL (ref 0.30–0.70)

## 2015-06-18 MED ORDER — WARFARIN SODIUM 7.5 MG PO TABS
15.0000 mg | ORAL_TABLET | Freq: Once | ORAL | Status: AC
  Administered 2015-06-18: 15 mg via ORAL
  Filled 2015-06-18: qty 2
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
## 2015-06-18 NOTE — Progress Notes (Signed)
11 Days Post-Op  Subjective: Large amount of emesis last evening so tube feeds on hold No fevers He denies SOB or increased abdominal pain  Objective: Vital signs in last 24 hours: Temp:  [98.6 F (37 C)-99.6 F (37.6 C)] 98.8 F (37.1 C) (11/27 0722) Pulse Rate:  [90-116] 99 (11/27 0800) Resp:  [0-40] 17 (11/27 0800) BP: (98-134)/(48-86) 132/82 mmHg (11/27 0800) SpO2:  [93 %-100 %] 100 % (11/27 0800) FiO2 (%):  [28 %] 28 % (11/27 0800) Weight:  [80.8 kg (178 lb 2.1 oz)] 80.8 kg (178 lb 2.1 oz) (11/27 0300) Last BM Date: 06/17/15  Intake/Output from previous day: 11/26 0701 - 11/27 0700 In: 1993.3 [I.V.:759; NG/GT:324.3; IV Piggyback:250; TPN:660] Out: 3630 [Urine:2295; Emesis/NG output:800; Drains:110; Stool:425] Intake/Output this shift:    Lungs clear CV RRR Abdomen soft, VAC in place, drain in ostomy draining stool.  Lab Results:   Recent Labs  06/17/15 1519 06/18/15 0310  WBC 20.5* 26.0*  HGB 8.7* 9.3*  HCT 27.9* 29.1*  PLT 591* 603*   BMET  Recent Labs  06/17/15 0501  NA 137  K 3.4*  CL 99*  CO2 31  GLUCOSE 112*  BUN 15  CREATININE 0.68  CALCIUM 9.1   PT/INR  Recent Labs  06/17/15 0501 06/18/15 0310  LABPROT 15.1 16.2*  INR 1.17 1.29   ABG No results for input(s): PHART, HCO3 in the last 72 hours.  Invalid input(s): PCO2, PO2  Studies/Results: No results found.  Anti-infectives: Anti-infectives    Start     Dose/Rate Route Frequency Ordered Stop   06/09/15 2000  vancomycin (VANCOCIN) IVPB 1000 mg/200 mL premix     1,000 mg 200 mL/hr over 60 Minutes Intravenous Every 8 hours 06/09/15 1045 06/16/15 1308   06/09/15 1930  piperacillin-tazobactam (ZOSYN) IVPB 3.375 g     3.375 g 12.5 mL/hr over 240 Minutes Intravenous 3 times per day 06/09/15 1045 06/16/15 1838   06/09/15 1100  piperacillin-tazobactam (ZOSYN) IVPB 3.375 g     3.375 g 100 mL/hr over 30 Minutes Intravenous  Once 06/09/15 1045 06/09/15 1200   06/09/15 1100  vancomycin  (VANCOCIN) 1,500 mg in sodium chloride 0.9 % 500 mL IVPB     1,500 mg 250 mL/hr over 120 Minutes Intravenous  Once 06/09/15 1045 06/09/15 1330   06/06/15 1230  meropenem (MERREM) 1 g in sodium chloride 0.9 % 100 mL IVPB  Status:  Discontinued     1 g 200 mL/hr over 30 Minutes Intravenous Every 8 hours 06/06/15 1156 06/08/15 0935   06/02/15 1800  ertapenem (INVANZ) 1 g in sodium chloride 0.9 % 50 mL IVPB  Status:  Discontinued     1 g 100 mL/hr over 30 Minutes Intravenous Every 24 hours 06/02/15 1750 06/06/15 1156   05/26/15 2000  vancomycin (VANCOCIN) IVPB 1000 mg/200 mL premix     1,000 mg 200 mL/hr over 60 Minutes Intravenous Every 8 hours 05/26/15 0755 06/01/15 1233   05/26/15 0830  vancomycin (VANCOCIN) 1,500 mg in sodium chloride 0.9 % 500 mL IVPB     1,500 mg 250 mL/hr over 120 Minutes Intravenous  Once 05/26/15 0743 05/26/15 1021   05/26/15 0730  ertapenem (INVANZ) 1 g in sodium chloride 0.9 % 50 mL IVPB  Status:  Discontinued     1 g 100 mL/hr over 30 Minutes Intravenous Every 24 hours 05/26/15 0729 05/26/15 0741   05/20/15 1330  piperacillin-tazobactam (ZOSYN) IVPB 3.375 g  Status:  Discontinued     3.375 g  12.5 mL/hr over 240 Minutes Intravenous Every 8 hours 05/20/15 1321 05/31/15 1136   05/17/15 1000  ertapenem (INVANZ) 1 g in sodium chloride 0.9 % 50 mL IVPB  Status:  Discontinued     1 g 100 mL/hr over 30 Minutes Intravenous Every 24 hours 05/17/15 0936 05/17/15 0951   05/17/15 1000  meropenem (MERREM) 1 g in sodium chloride 0.9 % 100 mL IVPB  Status:  Discontinued     1 g 200 mL/hr over 30 Minutes Intravenous 3 times per day 05/17/15 0951 05/20/15 1321   05/15/15 0815  piperacillin-tazobactam (ZOSYN) IVPB 3.375 g  Status:  Discontinued     3.375 g 12.5 mL/hr over 240 Minutes Intravenous 3 times per day 05/15/15 0804 05/17/15 0951   05/08/15 0200  ceFAZolin (ANCEF) IVPB 2 g/50 mL premix     2 g 100 mL/hr over 30 Minutes Intravenous 3 times per day 05/08/15 0140 05/09/15  2238   05/08/15 0130  ceFAZolin (ANCEF) powder 2 g  Status:  Discontinued     2 g Other Every 8 hours 05/08/15 0129 05/08/15 0139      Assessment/Plan: s/p Procedure(s): EXPLORATORY LAPAROTOMY with REMOVAL OF ABRA DEVICE (N/A) Complete ABDOMINAL CLOSURE (N/A) ILEOSTOMY Revision (Right)  WBC continuing to rise.  Antibiotics stopped two days ago.  Will not restart unless he spikes a temp.  Will check CXR and U/A.   Hold tube feeds today Continue all other care  LOS: 42 days    Annaleigha Woo A 06/18/2015

## 2015-06-19 LAB — CBC
HCT: 29.1 % — ABNORMAL LOW (ref 39.0–52.0)
Hemoglobin: 9.2 g/dL — ABNORMAL LOW (ref 13.0–17.0)
MCH: 27.5 pg (ref 26.0–34.0)
MCHC: 31.6 g/dL (ref 30.0–36.0)
MCV: 86.9 fL (ref 78.0–100.0)
Platelets: 625 10*3/uL — ABNORMAL HIGH (ref 150–400)
RBC: 3.35 MIL/uL — ABNORMAL LOW (ref 4.22–5.81)
RDW: 17.7 % — ABNORMAL HIGH (ref 11.5–15.5)
WBC: 18.6 10*3/uL — ABNORMAL HIGH (ref 4.0–10.5)

## 2015-06-19 LAB — PROTIME-INR
INR: 1.81 — ABNORMAL HIGH (ref 0.00–1.49)
Prothrombin Time: 21 seconds — ABNORMAL HIGH (ref 11.6–15.2)

## 2015-06-19 LAB — HEPARIN LEVEL (UNFRACTIONATED): Heparin Unfractionated: 0.7 IU/mL (ref 0.30–0.70)

## 2015-06-19 MED ORDER — WARFARIN SODIUM 10 MG PO TABS
10.0000 mg | ORAL_TABLET | Freq: Once | ORAL | Status: DC
  Filled 2015-06-19: qty 1

## 2015-06-19 MED ORDER — WHITE PETROLATUM GEL
Status: AC
  Administered 2015-06-19: 0.2
  Filled 2015-06-19: qty 1

## 2015-06-19 NOTE — Progress Notes (Signed)
Trauma Service Note  Subjective: Patient has just vomited and has been coughing either before or after the vomiting.  Seems to have calmed down recently.  Not getting any tube feedings.  Objective: Vital signs in last 24 hours: Temp:  [98.1 F (36.7 C)-99.1 F (37.3 C)] 98.6 F (37 C) (11/28 0733) Pulse Rate:  [77-123] 113 (11/28 1000) Resp:  [13-27] 16 (11/28 1000) BP: (120-144)/(65-97) 132/94 mmHg (11/28 1000) SpO2:  [93 %-100 %] 94 % (11/28 1000) FiO2 (%):  [21 %-28 %] 21 % (11/28 1000) Weight:  [78.8 kg (173 lb 11.6 oz)] 78.8 kg (173 lb 11.6 oz) (11/28 0500) Last BM Date: 06/19/15  Intake/Output from previous day: 11/27 0701 - 11/28 0700 In: 500 [I.V.:500] Out: 3250 [Urine:1825; Emesis/NG output:550; Stool:875] Intake/Output this shift: Total I/O In: 59.1 [I.V.:59.1] Out: -   General: Coughing, but otherwise in no distress  Lungs: Coarse breath sounds, especially on the right.  Abd: Soft.  NPWD in place.  Tender in the upper part of the abdomen bilaterally  Extremities: Swelling in the RLE has significantly decreased,   Neuro: Intact  Lab Results: CBC   Recent Labs  06/18/15 0310 06/19/15 0355  WBC 26.0* 18.6*  HGB 9.3* 9.2*  HCT 29.1* 29.1*  PLT 603* 625*   BMET  Recent Labs  06/17/15 0501  NA 137  K 3.4*  CL 99*  CO2 31  GLUCOSE 112*  BUN 15  CREATININE 0.68  CALCIUM 9.1   PT/INR  Recent Labs  06/18/15 0310 06/19/15 0355  LABPROT 16.2* 21.0*  INR 1.29 1.81*   ABG No results for input(s): PHART, HCO3 in the last 72 hours.  Invalid input(s): PCO2, PO2  Studies/Results: Dg Chest Port 1 View  06/18/2015  CLINICAL DATA:  Elevated white blood cell count EXAM: PORTABLE CHEST 1 VIEW COMPARISON:  06/10/2015 FINDINGS: Tracheostomy tube and LEFT PICC line unchanged. Interval removal of the feeding tube. Stable cardiac silhouette. There is mild bibasilar atelectasis. Mild central venous congestion. IMPRESSION: Mild basilar atelectasis and  central venous congestion. No edema or infiltrate. Electronically Signed   By: Genevive BiStewart  Edmunds M.D.   On: 06/18/2015 10:05    Anti-infectives: Anti-infectives    Start     Dose/Rate Route Frequency Ordered Stop   06/09/15 2000  vancomycin (VANCOCIN) IVPB 1000 mg/200 mL premix     1,000 mg 200 mL/hr over 60 Minutes Intravenous Every 8 hours 06/09/15 1045 06/16/15 1308   06/09/15 1930  piperacillin-tazobactam (ZOSYN) IVPB 3.375 g     3.375 g 12.5 mL/hr over 240 Minutes Intravenous 3 times per day 06/09/15 1045 06/16/15 1838   06/09/15 1100  piperacillin-tazobactam (ZOSYN) IVPB 3.375 g     3.375 g 100 mL/hr over 30 Minutes Intravenous  Once 06/09/15 1045 06/09/15 1200   06/09/15 1100  vancomycin (VANCOCIN) 1,500 mg in sodium chloride 0.9 % 500 mL IVPB     1,500 mg 250 mL/hr over 120 Minutes Intravenous  Once 06/09/15 1045 06/09/15 1330   06/06/15 1230  meropenem (MERREM) 1 g in sodium chloride 0.9 % 100 mL IVPB  Status:  Discontinued     1 g 200 mL/hr over 30 Minutes Intravenous Every 8 hours 06/06/15 1156 06/08/15 0935   06/02/15 1800  ertapenem (INVANZ) 1 g in sodium chloride 0.9 % 50 mL IVPB  Status:  Discontinued     1 g 100 mL/hr over 30 Minutes Intravenous Every 24 hours 06/02/15 1750 06/06/15 1156   05/26/15 2000  vancomycin (VANCOCIN) IVPB 1000  mg/200 mL premix     1,000 mg 200 mL/hr over 60 Minutes Intravenous Every 8 hours 05/26/15 0755 06/01/15 1233   05/26/15 0830  vancomycin (VANCOCIN) 1,500 mg in sodium chloride 0.9 % 500 mL IVPB     1,500 mg 250 mL/hr over 120 Minutes Intravenous  Once 05/26/15 0743 05/26/15 1021   05/26/15 0730  ertapenem (INVANZ) 1 g in sodium chloride 0.9 % 50 mL IVPB  Status:  Discontinued     1 g 100 mL/hr over 30 Minutes Intravenous Every 24 hours 05/26/15 0729 05/26/15 0741   05/20/15 1330  piperacillin-tazobactam (ZOSYN) IVPB 3.375 g  Status:  Discontinued     3.375 g 12.5 mL/hr over 240 Minutes Intravenous Every 8 hours 05/20/15 1321 05/31/15  1136   05/17/15 1000  ertapenem (INVANZ) 1 g in sodium chloride 0.9 % 50 mL IVPB  Status:  Discontinued     1 g 100 mL/hr over 30 Minutes Intravenous Every 24 hours 05/17/15 0936 05/17/15 0951   05/17/15 1000  meropenem (MERREM) 1 g in sodium chloride 0.9 % 100 mL IVPB  Status:  Discontinued     1 g 200 mL/hr over 30 Minutes Intravenous 3 times per day 05/17/15 0951 05/20/15 1321   05/15/15 0815  piperacillin-tazobactam (ZOSYN) IVPB 3.375 g  Status:  Discontinued     3.375 g 12.5 mL/hr over 240 Minutes Intravenous 3 times per day 05/15/15 0804 05/17/15 0951   05/08/15 0200  ceFAZolin (ANCEF) IVPB 2 g/50 mL premix     2 g 100 mL/hr over 30 Minutes Intravenous 3 times per day 05/08/15 0140 05/09/15 2238   05/08/15 0130  ceFAZolin (ANCEF) powder 2 g  Status:  Discontinued     2 g Other Every 8 hours 05/08/15 0129 05/08/15 0139      Assessment/Plan: s/p Procedure(s): EXPLORATORY LAPAROTOMY with REMOVAL OF ABRA DEVICE Complete ABDOMINAL CLOSURE ILEOSTOMY Revision Continue with TPN, respiratory care and monitoring of labs.  Currently he needs no blood, but WBC still 18K, but down.  Need to remove the Penrose drain around the ileostomy site.  LOS: 43 days   Marta Lamas. Gae Bon, MD, FACS [phone] Trauma Surgeon 06/19/2015
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
## 2015-06-19 NOTE — Progress Notes (Signed)
Pt has been breathing weirdly tonight.  In report I was told it was like hiccups, which it seems to be, but is odd and seems to be getting louder.  Pt is in no distress but is becoming more agitated trying to communicate with staff.  RT will continue to monitor.

## 2015-06-20 LAB — CBC
HCT: 30.7 % — ABNORMAL LOW (ref 39.0–52.0)
Hemoglobin: 9.6 g/dL — ABNORMAL LOW (ref 13.0–17.0)
MCH: 27.4 pg (ref 26.0–34.0)
MCHC: 31.3 g/dL (ref 30.0–36.0)
MCV: 87.7 fL (ref 78.0–100.0)
Platelets: 644 10*3/uL — ABNORMAL HIGH (ref 150–400)
RBC: 3.5 MIL/uL — ABNORMAL LOW (ref 4.22–5.81)
RDW: 17.9 % — ABNORMAL HIGH (ref 11.5–15.5)
WBC: 16.5 10*3/uL — ABNORMAL HIGH (ref 4.0–10.5)

## 2015-06-20 LAB — HEPARIN LEVEL (UNFRACTIONATED)
Heparin Unfractionated: 0.72 IU/mL — ABNORMAL HIGH (ref 0.30–0.70)
Heparin Unfractionated: 0.72 IU/mL — ABNORMAL HIGH (ref 0.30–0.70)

## 2015-06-20 LAB — PROTIME-INR
INR: 2.03 — ABNORMAL HIGH (ref 0.00–1.49)
Prothrombin Time: 22.8 seconds — ABNORMAL HIGH (ref 11.6–15.2)

## 2015-06-20 MED ORDER — M.V.I. ADULT IV INJ
INJECTION | INTRAVENOUS | Status: AC
  Administered 2015-06-20: 18:00:00 via INTRAVENOUS
  Filled 2015-06-20: qty 1440

## 2015-06-20 MED ORDER — WARFARIN SODIUM 10 MG PO TABS
10.0000 mg | ORAL_TABLET | Freq: Once | ORAL | Status: AC
  Administered 2015-06-20: 10 mg via ORAL
  Filled 2015-06-20: qty 1

## 2015-06-20 MED ORDER — FAT EMULSION 20 % IV EMUL
240.0000 mL | INTRAVENOUS | Status: AC
  Administered 2015-06-20: 240 mL via INTRAVENOUS
  Filled 2015-06-20: qty 250

## 2015-06-20 MED ORDER — METOCLOPRAMIDE HCL 5 MG/ML IJ SOLN
10.0000 mg | Freq: Four times a day (QID) | INTRAMUSCULAR | Status: DC
  Administered 2015-06-20 – 2015-06-28 (×31): 10 mg via INTRAVENOUS
  Filled 2015-06-20 (×31): qty 2
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
## 2015-06-20 NOTE — Progress Notes (Signed)
Please see procedure section for full FEES details.    Raymond PippinMelissa Carrick Rijos, M.A., CCC-SLP [phone]
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
## 2015-06-20 NOTE — Progress Notes (Signed)
Trauma Service Note  Subjective: Temperatures are down and WBC is down.  Patient not coughing.  Seems very alert.  No acute distress and very calm   Objective: Vital signs in last 24 hours: Temp:  [98.2 F (36.8 C)-100.5 F (38.1 C)] 98.6 F (37 C) (11/29 0726) Pulse Rate:  [34-126] 110 (11/29 0900) Resp:  [13-34] 23 (11/29 0900) BP: (103-144)/(63-97) 133/96 mmHg (11/29 0900) SpO2:  [62 %-100 %] 99 % (11/29 0900) FiO2 (%):  [21 %] 21 % (11/29 0800) Weight:  [73.4 kg (161 lb 13.1 oz)] 73.4 kg (161 lb 13.1 oz) (11/29 0500) Last BM Date: 06/19/15  Intake/Output from previous day: 11/28 0701 - 11/29 0700 In: 468.1 [I.V.:458.1] Out: 2025 [Urine:1375; Emesis/NG output:25; Stool:625] Intake/Output this shift: Total I/O In: 57.3 [I.V.:37.3; Other:20] Out: -   General: No acute distress.  Lungs: Clear  Abd: right peristomal penrose drain removed..  Reapplied ostomy device.  Mallinkrodt tube in place, but possibly could remove soon.  Extremities: RLE much less swollen  Neuro: Intact  Lab Results: CBC   Recent Labs  06/19/15 0355 06/20/15 0409  WBC 18.6* 16.5*  HGB 9.2* 9.6*  HCT 29.1* 30.7*  PLT 625* 644*   BMET No results for input(s): NA, K, CL, CO2, GLUCOSE, BUN, CREATININE, CALCIUM in the last 72 hours. PT/INR  Recent Labs  06/19/15 0355 06/20/15 0409  LABPROT 21.0* 22.8*  INR 1.81* 2.03*   ABG No results for input(s): PHART, HCO3 in the last 72 hours.  Invalid input(s): PCO2, PO2  Studies/Results: No results found.  Anti-infectives: Anti-infectives    Start     Dose/Rate Route Frequency Ordered Stop   06/09/15 2000  vancomycin (VANCOCIN) IVPB 1000 mg/200 mL premix     1,000 mg 200 mL/hr over 60 Minutes Intravenous Every 8 hours 06/09/15 1045 06/16/15 1308   06/09/15 1930  piperacillin-tazobactam (ZOSYN) IVPB 3.375 g     3.375 g 12.5 mL/hr over 240 Minutes Intravenous 3 times per day 06/09/15 1045 06/16/15 1838   06/09/15 1100   piperacillin-tazobactam (ZOSYN) IVPB 3.375 g     3.375 g 100 mL/hr over 30 Minutes Intravenous  Once 06/09/15 1045 06/09/15 1200   06/09/15 1100  vancomycin (VANCOCIN) 1,500 mg in sodium chloride 0.9 % 500 mL IVPB     1,500 mg 250 mL/hr over 120 Minutes Intravenous  Once 06/09/15 1045 06/09/15 1330   06/06/15 1230  meropenem (MERREM) 1 g in sodium chloride 0.9 % 100 mL IVPB  Status:  Discontinued     1 g 200 mL/hr over 30 Minutes Intravenous Every 8 hours 06/06/15 1156 06/08/15 0935   06/02/15 1800  ertapenem (INVANZ) 1 g in sodium chloride 0.9 % 50 mL IVPB  Status:  Discontinued     1 g 100 mL/hr over 30 Minutes Intravenous Every 24 hours 06/02/15 1750 06/06/15 1156   05/26/15 2000  vancomycin (VANCOCIN) IVPB 1000 mg/200 mL premix     1,000 mg 200 mL/hr over 60 Minutes Intravenous Every 8 hours 05/26/15 0755 06/01/15 1233   05/26/15 0830  vancomycin (VANCOCIN) 1,500 mg in sodium chloride 0.9 % 500 mL IVPB     1,500 mg 250 mL/hr over 120 Minutes Intravenous  Once 05/26/15 0743 05/26/15 1021   05/26/15 0730  ertapenem (INVANZ) 1 g in sodium chloride 0.9 % 50 mL IVPB  Status:  Discontinued     1 g 100 mL/hr over 30 Minutes Intravenous Every 24 hours 05/26/15 0729 05/26/15 0741   05/20/15 1330  piperacillin-tazobactam (  ZOSYN) IVPB 3.375 g  Status:  Discontinued     3.375 g 12.5 mL/hr over 240 Minutes Intravenous Every 8 hours 05/20/15 1321 05/31/15 1136   05/17/15 1000  ertapenem (INVANZ) 1 g in sodium chloride 0.9 % 50 mL IVPB  Status:  Discontinued     1 g 100 mL/hr over 30 Minutes Intravenous Every 24 hours 05/17/15 0936 05/17/15 0951   05/17/15 1000  meropenem (MERREM) 1 g in sodium chloride 0.9 % 100 mL IVPB  Status:  Discontinued     1 g 200 mL/hr over 30 Minutes Intravenous 3 times per day 05/17/15 0951 05/20/15 1321   05/15/15 0815  piperacillin-tazobactam (ZOSYN) IVPB 3.375 g  Status:  Discontinued     3.375 g 12.5 mL/hr over 240 Minutes Intravenous 3 times per day 05/15/15 0804  05/17/15 0951   05/08/15 0200  ceFAZolin (ANCEF) IVPB 2 g/50 mL premix     2 g 100 mL/hr over 30 Minutes Intravenous 3 times per day 05/08/15 0140 05/09/15 2238   05/08/15 0130  ceFAZolin (ANCEF) powder 2 g  Status:  Discontinued     2 g Other Every 8 hours 05/08/15 0129 05/08/15 0139      Assessment/Plan: s/p Procedure(s): EXPLORATORY LAPAROTOMY with REMOVAL OF ABRA DEVICE Complete ABDOMINAL CLOSURE ILEOSTOMY Revision Patient pulled his NGT out and is no long vomiting.  Will get swallowing evaluation. Patient should be on TPN  LOS: 44 days   Marta LamasJames O. Gae BonWyatt, [name], MD, FACS [phone] Trauma Surgeon 06/20/2015
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
## 2015-06-20 NOTE — Progress Notes (Signed)
   VAC changed, mepitel to cover bowel which has granulated, then black sponge.  Osualdo Hansell, ANP-BC

## 2015-06-20 NOTE — Progress Notes (Signed)
RT note- speech with swallow evaluation, pasey muir valve placed, tolerating well.
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
## 2015-06-21 LAB — COMPREHENSIVE METABOLIC PANEL
ALT: 65 U/L — ABNORMAL HIGH (ref 17–63)
AST: 33 U/L (ref 15–41)
Albumin: 2.5 g/dL — ABNORMAL LOW (ref 3.5–5.0)
Alkaline Phosphatase: 233 U/L — ABNORMAL HIGH (ref 38–126)
Anion gap: 10 (ref 5–15)
BUN: 16 mg/dL (ref 6–20)
CO2: 33 mmol/L — ABNORMAL HIGH (ref 22–32)
Calcium: 9.4 mg/dL (ref 8.9–10.3)
Chloride: 100 mmol/L — ABNORMAL LOW (ref 101–111)
Creatinine, Ser: 0.98 mg/dL (ref 0.61–1.24)
GFR calc Af Amer: >60 mL/min (ref >60)
GFR calc non Af Amer: >60 mL/min (ref >60)
Glucose, Bld: 140 mg/dL — ABNORMAL HIGH (ref 65–99)
Potassium: 2.9 mmol/L — ABNORMAL LOW (ref 3.5–5.1)
Sodium: 143 mmol/L (ref 135–145)
Total Bilirubin: 1 mg/dL (ref 0.3–1.2)
Total Protein: 9.6 g/dL — ABNORMAL HIGH (ref 6.5–8.1)

## 2015-06-21 LAB — PHOSPHORUS: Phosphorus: 4.8 mg/dL — ABNORMAL HIGH (ref 2.5–4.6)

## 2015-06-21 LAB — HEPARIN LEVEL (UNFRACTIONATED): Heparin Unfractionated: 0.6 IU/mL (ref 0.30–0.70)

## 2015-06-21 LAB — CBC WITH DIFFERENTIAL/PLATELET
Basophils Absolute: 0 10*3/uL (ref 0.0–0.1)
Basophils Relative: 0 %
Eosinophils Absolute: 0.2 10*3/uL (ref 0.0–0.7)
Eosinophils Relative: 1 %
HCT: 33 % — ABNORMAL LOW (ref 39.0–52.0)
Hemoglobin: 10.5 g/dL — ABNORMAL LOW (ref 13.0–17.0)
Lymphocytes Relative: 26 %
Lymphs Abs: 4.9 10*3/uL — ABNORMAL HIGH (ref 0.7–4.0)
MCH: 28.2 pg (ref 26.0–34.0)
MCHC: 31.8 g/dL (ref 30.0–36.0)
MCV: 88.5 fL (ref 78.0–100.0)
Monocytes Absolute: 1.9 10*3/uL — ABNORMAL HIGH (ref 0.1–1.0)
Monocytes Relative: 10 %
Neutro Abs: 11.9 10*3/uL — ABNORMAL HIGH (ref 1.7–7.7)
Neutrophils Relative %: 63 %
Platelets: 623 10*3/uL — ABNORMAL HIGH (ref 150–400)
RBC: 3.73 MIL/uL — ABNORMAL LOW (ref 4.22–5.81)
RDW: 17.8 % — ABNORMAL HIGH (ref 11.5–15.5)
Smear Review: INCREASED
WBC: 18.9 10*3/uL — ABNORMAL HIGH (ref 4.0–10.5)

## 2015-06-21 LAB — PROTIME-INR
INR: 1.81 — ABNORMAL HIGH (ref 0.00–1.49)
Prothrombin Time: 20.9 seconds — ABNORMAL HIGH (ref 11.6–15.2)

## 2015-06-21 LAB — MAGNESIUM: Magnesium: 1.8 mg/dL (ref 1.7–2.4)

## 2015-06-21 MED ORDER — POTASSIUM CHLORIDE 10 MEQ/50ML IV SOLN
10.0000 meq | INTRAVENOUS | Status: AC
  Administered 2015-06-21 (×4): 10 meq via INTRAVENOUS
  Filled 2015-06-21 (×4): qty 50

## 2015-06-21 MED ORDER — TRACE MINERALS CR-CU-MN-SE-ZN 10-1000-500-60 MCG/ML IV SOLN
INTRAVENOUS | Status: AC
  Administered 2015-06-21: 18:00:00 via INTRAVENOUS
  Filled 2015-06-21: qty 1992

## 2015-06-21 MED ORDER — WARFARIN SODIUM 10 MG PO TABS
10.0000 mg | ORAL_TABLET | Freq: Once | ORAL | Status: AC
  Administered 2015-06-21: 10 mg via ORAL
  Filled 2015-06-21: qty 1

## 2015-06-21 MED ORDER — FAT EMULSION 20 % IV EMUL
240.0000 mL | INTRAVENOUS | Status: AC
  Administered 2015-06-21: 240 mL via INTRAVENOUS
  Filled 2015-06-21: qty 250
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
## 2015-06-21 NOTE — Progress Notes (Signed)
Trauma Service Note  Subjective: Patient doing well.  Very alert and awake.  Trach to be downsized today.  Has been off the ventilator for several days.  Objective: Vital signs in last 24 hours: Temp:  [98.3 F (36.8 C)-99.8 F (37.7 C)] 98.3 F (36.8 C) (11/30 0716) Pulse Rate:  [94-121] 117 (11/30 0900) Resp:  [0-31] 27 (11/30 0900) BP: (97-140)/(55-105) 136/97 mmHg (11/30 0900) SpO2:  [96 %-100 %] 96 % (11/30 0900) FiO2 (%):  [21 %] 21 % (11/30 0900) Weight:  [73.7 kg (162 lb 7.7 oz)] 73.7 kg (162 lb 7.7 oz) (11/30 0400) Last BM Date: 06/21/15  Intake/Output from previous day: 11/29 0701 - 11/30 0700 In: 1347.7 [I.V.:377.5; TPN:750.2] Out: 1850 [Urine:1350; Stool:500] Intake/Output this shift: Total I/O In: 244 [I.V.:34; Other:20; IV Piggyback:50; TPN:140] Out: 40 [Urine:40]  General: No distress.  Wants to drink something badly  Lungs: Clear  Abd: VAC in place.  No vomiting  Extremities: No changes.  RLE much improved in swelling  Neuro: Intact  Lab Results: CBC   Recent Labs  06/20/15 0409 06/21/15 0317  WBC 16.5* 18.9*  HGB 9.6* 10.5*  HCT 30.7* 33.0*  PLT 644* 623*   BMET  Recent Labs  06/21/15 0317  NA 143  K 2.9*  CL 100*  CO2 33*  GLUCOSE 140*  BUN 16  CREATININE 0.98  CALCIUM 9.4   PT/INR  Recent Labs  06/20/15 0409 06/21/15 0317  LABPROT 22.8* 20.9*  INR 2.03* 1.81*   ABG No results for input(s): PHART, HCO3 in the last 72 hours.  Invalid input(s): PCO2, PO2  Studies/Results: No results found.  Anti-infectives: Anti-infectives    Start     Dose/Rate Route Frequency Ordered Stop   06/09/15 2000  vancomycin (VANCOCIN) IVPB 1000 mg/200 mL premix     1,000 mg 200 mL/hr over 60 Minutes Intravenous Every 8 hours 06/09/15 1045 06/16/15 1308   06/09/15 1930  piperacillin-tazobactam (ZOSYN) IVPB 3.375 g     3.375 g 12.5 mL/hr over 240 Minutes Intravenous 3 times per day 06/09/15 1045 06/16/15 1838   06/09/15 1100   piperacillin-tazobactam (ZOSYN) IVPB 3.375 g     3.375 g 100 mL/hr over 30 Minutes Intravenous  Once 06/09/15 1045 06/09/15 1200   06/09/15 1100  vancomycin (VANCOCIN) 1,500 mg in sodium chloride 0.9 % 500 mL IVPB     1,500 mg 250 mL/hr over 120 Minutes Intravenous  Once 06/09/15 1045 06/09/15 1330   06/06/15 1230  meropenem (MERREM) 1 g in sodium chloride 0.9 % 100 mL IVPB  Status:  Discontinued     1 g 200 mL/hr over 30 Minutes Intravenous Every 8 hours 06/06/15 1156 06/08/15 0935   06/02/15 1800  ertapenem (INVANZ) 1 g in sodium chloride 0.9 % 50 mL IVPB  Status:  Discontinued     1 g 100 mL/hr over 30 Minutes Intravenous Every 24 hours 06/02/15 1750 06/06/15 1156   05/26/15 2000  vancomycin (VANCOCIN) IVPB 1000 mg/200 mL premix     1,000 mg 200 mL/hr over 60 Minutes Intravenous Every 8 hours 05/26/15 0755 06/01/15 1233   05/26/15 0830  vancomycin (VANCOCIN) 1,500 mg in sodium chloride 0.9 % 500 mL IVPB     1,500 mg 250 mL/hr over 120 Minutes Intravenous  Once 05/26/15 0743 05/26/15 1021   05/26/15 0730  ertapenem (INVANZ) 1 g in sodium chloride 0.9 % 50 mL IVPB  Status:  Discontinued     1 g 100 mL/hr over 30 Minutes Intravenous  Every 24 hours 05/26/15 0729 05/26/15 0741   05/20/15 1330  piperacillin-tazobactam (ZOSYN) IVPB 3.375 g  Status:  Discontinued     3.375 g 12.5 mL/hr over 240 Minutes Intravenous Every 8 hours 05/20/15 1321 05/31/15 1136   05/17/15 1000  ertapenem (INVANZ) 1 g in sodium chloride 0.9 % 50 mL IVPB  Status:  Discontinued     1 g 100 mL/hr over 30 Minutes Intravenous Every 24 hours 05/17/15 0936 05/17/15 0951   05/17/15 1000  meropenem (MERREM) 1 g in sodium chloride 0.9 % 100 mL IVPB  Status:  Discontinued     1 g 200 mL/hr over 30 Minutes Intravenous 3 times per day 05/17/15 0951 05/20/15 1321   05/15/15 0815  piperacillin-tazobactam (ZOSYN) IVPB 3.375 g  Status:  Discontinued     3.375 g 12.5 mL/hr over 240 Minutes Intravenous 3 times per day 05/15/15 0804  05/17/15 0951   05/08/15 0200  ceFAZolin (ANCEF) IVPB 2 g/50 mL premix     2 g 100 mL/hr over 30 Minutes Intravenous 3 times per day 05/08/15 0140 05/09/15 2238   05/08/15 0130  ceFAZolin (ANCEF) powder 2 g  Status:  Discontinued     2 g Other Every 8 hours 05/08/15 0129 05/08/15 0139      Assessment/Plan: s/p Procedure(s): EXPLORATORY LAPAROTOMY with REMOVAL OF ABRA DEVICE Complete ABDOMINAL CLOSURE ILEOSTOMY Revision Advance diet Consider transfer to SDU today.  LOS: 45 days   Marta LamasJames O. Gae BonWyatt, [name], MD, FACS [phone] Trauma Surgeon 06/21/2015

## 2015-06-21 NOTE — Procedures (Signed)
Tracheostomy Change Note  Patient Details:   Name: Raymond Bowen DOB: [birth year] MRN: [id number]    Airway Documentation:     Evaluation  O2 sats: stable throughout Complications: No apparent complications Patient did tolerate procedure well. Bilateral Breath Sounds: Clear, Diminished Suctioning: Airway  Remains on 21% ATC  Raymond Bowen, Raymond Bowen 06/21/2015, 9:45 AM
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
## 2015-06-22 LAB — COMPREHENSIVE METABOLIC PANEL
ALT: 55 U/L (ref 17–63)
AST: 30 U/L (ref 15–41)
Albumin: 2.4 g/dL — ABNORMAL LOW (ref 3.5–5.0)
Alkaline Phosphatase: 210 U/L — ABNORMAL HIGH (ref 38–126)
Anion gap: 11 (ref 5–15)
BUN: 18 mg/dL (ref 6–20)
CO2: 31 mmol/L (ref 22–32)
Calcium: 9.5 mg/dL (ref 8.9–10.3)
Chloride: 94 mmol/L — ABNORMAL LOW (ref 101–111)
Creatinine, Ser: 0.94 mg/dL (ref 0.61–1.24)
GFR calc Af Amer: >60 mL/min (ref >60)
GFR calc non Af Amer: >60 mL/min (ref >60)
Glucose, Bld: 105 mg/dL — ABNORMAL HIGH (ref 65–99)
Potassium: 3 mmol/L — ABNORMAL LOW (ref 3.5–5.1)
Sodium: 136 mmol/L (ref 135–145)
Total Bilirubin: 1.3 mg/dL — ABNORMAL HIGH (ref 0.3–1.2)
Total Protein: 8.8 g/dL — ABNORMAL HIGH (ref 6.5–8.1)

## 2015-06-22 LAB — CBC WITH DIFFERENTIAL/PLATELET
Basophils Absolute: 0 10*3/uL (ref 0.0–0.1)
Basophils Relative: 0 %
Eosinophils Absolute: 0.4 10*3/uL (ref 0.0–0.7)
Eosinophils Relative: 2 %
HCT: 33.6 % — ABNORMAL LOW (ref 39.0–52.0)
Hemoglobin: 10.6 g/dL — ABNORMAL LOW (ref 13.0–17.0)
Lymphocytes Relative: 27 %
Lymphs Abs: 5 10*3/uL — ABNORMAL HIGH (ref 0.7–4.0)
MCH: 28 pg (ref 26.0–34.0)
MCHC: 31.5 g/dL (ref 30.0–36.0)
MCV: 88.9 fL (ref 78.0–100.0)
Monocytes Absolute: 1.5 10*3/uL — ABNORMAL HIGH (ref 0.1–1.0)
Monocytes Relative: 8 %
Neutro Abs: 11.7 10*3/uL — ABNORMAL HIGH (ref 1.7–7.7)
Neutrophils Relative %: 63 %
Platelets: 588 10*3/uL — ABNORMAL HIGH (ref 150–400)
RBC: 3.78 MIL/uL — ABNORMAL LOW (ref 4.22–5.81)
RDW: 17.9 % — ABNORMAL HIGH (ref 11.5–15.5)
WBC: 18.6 10*3/uL — ABNORMAL HIGH (ref 4.0–10.5)

## 2015-06-22 LAB — PHOSPHORUS: Phosphorus: 4.2 mg/dL (ref 2.5–4.6)

## 2015-06-22 LAB — HEPARIN LEVEL (UNFRACTIONATED): Heparin Unfractionated: 0.31 IU/mL (ref 0.30–0.70)

## 2015-06-22 LAB — MAGNESIUM: Magnesium: 1.6 mg/dL — ABNORMAL LOW (ref 1.7–2.4)

## 2015-06-22 LAB — PROTIME-INR
INR: 1.28 (ref 0.00–1.49)
Prothrombin Time: 16.1 seconds — ABNORMAL HIGH (ref 11.6–15.2)

## 2015-06-22 MED ORDER — POTASSIUM CHLORIDE 10 MEQ/50ML IV SOLN
10.0000 meq | INTRAVENOUS | Status: AC
  Administered 2015-06-22 (×4): 10 meq via INTRAVENOUS
  Filled 2015-06-22 (×4): qty 50

## 2015-06-22 MED ORDER — TRACE MINERALS CR-CU-MN-SE-ZN 10-1000-500-60 MCG/ML IV SOLN
INTRAVENOUS | Status: AC
  Administered 2015-06-22: 18:00:00 via INTRAVENOUS
  Filled 2015-06-22: qty 960

## 2015-06-22 MED ORDER — WARFARIN SODIUM 10 MG PO TABS
10.0000 mg | ORAL_TABLET | Freq: Once | ORAL | Status: AC
  Administered 2015-06-22: 10 mg via ORAL
  Filled 2015-06-22 (×2): qty 1

## 2015-06-22 MED ORDER — FAT EMULSION 20 % IV EMUL
240.0000 mL | INTRAVENOUS | Status: AC
  Administered 2015-06-22: 240 mL via INTRAVENOUS
  Filled 2015-06-22: qty 250

## 2015-06-22 MED ORDER — BOOST / RESOURCE BREEZE PO LIQD
1.0000 | Freq: Three times a day (TID) | ORAL | Status: DC
  Administered 2015-06-22 – 2015-07-03 (×29): 1 via ORAL

## 2015-06-22 MED ORDER — MAGNESIUM SULFATE 2 GM/50ML IV SOLN
2.0000 g | Freq: Once | INTRAVENOUS | Status: AC
  Administered 2015-06-22: 2 g via INTRAVENOUS
  Filled 2015-06-22: qty 50

## 2015-06-22 NOTE — Progress Notes (Signed)
Trauma Service Note  Subjective: Patient very awake and alert.  Eating a bit.  Objective: Vital signs in last 24 hours: Temp:  [98 F (36.7 C)-99.8 F (37.7 C)] 98.9 F (37.2 C) (12/01 0739) Pulse Rate:  [113-153] 130 (12/01 0700) Resp:  [11-26] 23 (12/01 0700) BP: (121-148)/(81-110) 144/81 mmHg (12/01 0700) SpO2:  [96 %-100 %] 98 % (12/01 0700) FiO2 (%):  [21 %] 21 % (12/01 0328) Weight:  [74.8 kg (164 lb 14.5 oz)] 74.8 kg (164 lb 14.5 oz) (12/01 0500) Last BM Date: 06/21/15  Intake/Output from previous day: 11/30 0701 - 12/01 0700 In: 2903 [P.O.:200; I.V.:438; IV Piggyback:200; TPN:2025] Out: 2541 [Urine:1590; Drains:125; Stool:826] Intake/Output this shift:    General: No acute distress.  Doing well  Lungs: Clear to auscultation  Abd: Open abdomen doing extremely well.  [place]ro[place] removed from the ileostomy  Still draining well.    Extremities: No changes.  RLE still improving with the swelling  Neuro: Intact  Lab Results: CBC   Recent Labs  06/21/15 0317 06/22/15 0359  WBC 18.9* 18.6*  HGB 10.5* 10.6*  HCT 33.0* 33.6*  PLT 623* 588*   BMET  Recent Labs  06/21/15 0317 06/22/15 0359  NA 143 136  K 2.9* 3.0*  CL 100* 94*  CO2 33* 31  GLUCOSE 140* 105*  BUN 16 18  CREATININE 0.98 0.94  CALCIUM 9.4 9.5   PT/INR  Recent Labs  06/21/15 0317 06/22/15 0359  LABPROT 20.9* 16.1*  INR 1.81* 1.28   ABG No results for input(s): PHART, HCO3 in the last 72 hours.  Invalid input(s): PCO2, PO2  Studies/Results: No results found.  Anti-infectives: Anti-infectives    Start     Dose/Rate Route Frequency Ordered Stop   06/09/15 2000  vancomycin (VANCOCIN) IVPB 1000 mg/200 mL premix     1,000 mg 200 mL/hr over 60 Minutes Intravenous Every 8 hours 06/09/15 1045 06/16/15 1308   06/09/15 1930  piperacillin-tazobactam (ZOSYN) IVPB 3.375 g     3.375 g 12.5 mL/hr over 240 Minutes Intravenous 3 times per day 06/09/15 1045 06/16/15 1838   06/09/15 1100   piperacillin-tazobactam (ZOSYN) IVPB 3.375 g     3.375 g 100 mL/hr over 30 Minutes Intravenous  Once 06/09/15 1045 06/09/15 1200   06/09/15 1100  vancomycin (VANCOCIN) 1,500 mg in sodium chloride 0.9 % 500 mL IVPB     1,500 mg 250 mL/hr over 120 Minutes Intravenous  Once 06/09/15 1045 06/09/15 1330   06/06/15 1230  meropenem (MERREM) 1 g in sodium chloride 0.9 % 100 mL IVPB  Status:  Discontinued     1 g 200 mL/hr over 30 Minutes Intravenous Every 8 hours 06/06/15 1156 06/08/15 0935   06/02/15 1800  ertapenem (INVANZ) 1 g in sodium chloride 0.9 % 50 mL IVPB  Status:  Discontinued     1 g 100 mL/hr over 30 Minutes Intravenous Every 24 hours 06/02/15 1750 06/06/15 1156   05/26/15 2000  vancomycin (VANCOCIN) IVPB 1000 mg/200 mL premix     1,000 mg 200 mL/hr over 60 Minutes Intravenous Every 8 hours 05/26/15 0755 06/01/15 1233   05/26/15 0830  vancomycin (VANCOCIN) 1,500 mg in sodium chloride 0.9 % 500 mL IVPB     1,500 mg 250 mL/hr over 120 Minutes Intravenous  Once 05/26/15 0743 05/26/15 1021   05/26/15 0730  ertapenem (INVANZ) 1 g in sodium chloride 0.9 % 50 mL IVPB  Status:  Discontinued     1 g 100 mL/hr over 30  Minutes Intravenous Every 24 hours 05/26/15 0729 05/26/15 0741   05/20/15 1330  piperacillin-tazobactam (ZOSYN) IVPB 3.375 g  Status:  Discontinued     3.375 g 12.5 mL/hr over 240 Minutes Intravenous Every 8 hours 05/20/15 1321 05/31/15 1136   05/17/15 1000  ertapenem (INVANZ) 1 g in sodium chloride 0.9 % 50 mL IVPB  Status:  Discontinued     1 g 100 mL/hr over 30 Minutes Intravenous Every 24 hours 05/17/15 0936 05/17/15 0951   05/17/15 1000  meropenem (MERREM) 1 g in sodium chloride 0.9 % 100 mL IVPB  Status:  Discontinued     1 g 200 mL/hr over 30 Minutes Intravenous 3 times per day 05/17/15 0951 05/20/15 1321   05/15/15 0815  piperacillin-tazobactam (ZOSYN) IVPB 3.375 g  Status:  Discontinued     3.375 g 12.5 mL/hr over 240 Minutes Intravenous 3 times per day 05/15/15 0804  05/17/15 0951   05/08/15 0200  ceFAZolin (ANCEF) IVPB 2 g/50 mL premix     2 g 100 mL/hr over 30 Minutes Intravenous 3 times per day 05/08/15 0140 05/09/15 2238   05/08/15 0130  ceFAZolin (ANCEF) powder 2 g  Status:  Discontinued     2 g Other Every 8 hours 05/08/15 0129 05/08/15 0139      Assessment/Plan: s/p Procedure(s): EXPLORATORY LAPAROTOMY with REMOVAL OF ABRA DEVICE Complete ABDOMINAL CLOSURE ILEOSTOMY Revision Advance diet Transfer to SDU.  Cut TPN in half  LOS: 46 days   Marta LamasJames O. Gae BonWyatt, [name], MD, FACS [phone] Trauma Surgeon 06/22/2015
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
## 2015-06-22 NOTE — Progress Notes (Signed)
Pt making good progression; now tolerating CL diet--soon to progress to dysphagia 3 diet.  PT/OT consults completed; recommending CIR, and consult in progress.  Pt continues on TPN, IV heparin for DVTs, and IV lasix.  Pt has order for transfer to SDU.    Will continue to follow as pt progresses.    Quintella BatonJulie W. Wally Behan, RN, BSN  Trauma/Neuro ICU Case Manager [phone]

## 2015-06-23 LAB — CBC
HCT: 34.3 % — ABNORMAL LOW (ref 39.0–52.0)
Hemoglobin: 10.9 g/dL — ABNORMAL LOW (ref 13.0–17.0)
MCH: 27.7 pg (ref 26.0–34.0)
MCHC: 31.8 g/dL (ref 30.0–36.0)
MCV: 87.3 fL (ref 78.0–100.0)
Platelets: 552 10*3/uL — ABNORMAL HIGH (ref 150–400)
RBC: 3.93 MIL/uL — ABNORMAL LOW (ref 4.22–5.81)
RDW: 17.6 % — ABNORMAL HIGH (ref 11.5–15.5)
WBC: 17.4 10*3/uL — ABNORMAL HIGH (ref 4.0–10.5)

## 2015-06-23 LAB — PROTIME-INR
INR: 1.26 (ref 0.00–1.49)
Prothrombin Time: 15.9 seconds — ABNORMAL HIGH (ref 11.6–15.2)

## 2015-06-23 LAB — HEPARIN LEVEL (UNFRACTIONATED): Heparin Unfractionated: 0.48 IU/mL (ref 0.30–0.70)

## 2015-06-23 MED ORDER — WARFARIN SODIUM 7.5 MG PO TABS
15.0000 mg | ORAL_TABLET | Freq: Once | ORAL | Status: AC
  Administered 2015-06-23: 15 mg via ORAL
  Filled 2015-06-23: qty 2

## 2015-06-23 MED ORDER — FAT EMULSION 20 % IV EMUL
240.0000 mL | INTRAVENOUS | Status: DC
  Filled 2015-06-23: qty 250

## 2015-06-23 MED ORDER — TRACE MINERALS CR-CU-MN-SE-ZN 10-1000-500-60 MCG/ML IV SOLN
INTRAVENOUS | Status: DC
  Filled 2015-06-23: qty 960
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
## 2015-06-23 NOTE — Progress Notes (Signed)
Rehab admissions - I am now following along for potential inpatient rehab admission once patient is medically ready.  Patient with sitter at bedside on my rounds.  I will contact family to confirm discharge plan and follow along for progress.  Call me for questions.  #[phone]

## 2015-06-23 NOTE — Progress Notes (Signed)
Trauma Service Note  Subjective: Patient with trach.  Very cooperative.  No distress.  Objective: Vital signs in last 24 hours: Temp:  [98 F (36.7 C)-98.9 F (37.2 C)] 98.4 F (36.9 C) (12/02 0400) Pulse Rate:  [91-129] 98 (12/02 0800) Resp:  [14-27] 19 (12/02 0800) BP: (119-154)/(82-119) 127/96 mmHg (12/02 0800) SpO2:  [95 %-100 %] 100 % (12/02 0800) FiO2 (%):  [21 %-28 %] 28 % (12/02 0429) Weight:  [75.4 kg (166 lb 3.6 oz)] 75.4 kg (166 lb 3.6 oz) (12/02 0500) Last BM Date: 06/22/15  Intake/Output from previous day: 12/01 0701 - 12/02 0700 In: 2400.1 [P.O.:75; I.V.:408; IV Piggyback:250; TPN:1667.1] Out: 2800 [Urine:1865; Drains:50; Stool:885] Intake/Output this shift: Total I/O In: 67 [I.V.:17; TPN:50] Out: -   General: No acute distress  Lungs: Clear  Abd: Firm, good bowel sounds.  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  Has not taken much PO   Extremities: RLE still tneder in the calf, more swollen but much better.  Neuro: Intact  Lab Results: CBC   Recent Labs  06/22/15 0359 06/23/15 0327  WBC 18.6* 17.4*  HGB 10.6* 10.9*  HCT 33.6* 34.3*  PLT 588* 552*   BMET  Recent Labs  06/21/15 0317 06/22/15 0359  NA 143 136  K 2.9* 3.0*  CL 100* 94*  CO2 33* 31  GLUCOSE 140* 105*  BUN 16 18  CREATININE 0.98 0.94  CALCIUM 9.4 9.5   PT/INR  Recent Labs  06/22/15 0359 06/23/15 0327  LABPROT 16.1* 15.9*  INR 1.28 1.26   ABG No results for input(s): PHART, HCO3 in the last 72 hours.  Invalid input(s): PCO2, PO2  Studies/Results: No results found.  Anti-infectives: Anti-infectives    Start     Dose/Rate Route Frequency Ordered Stop   06/09/15 2000  vancomycin (VANCOCIN) IVPB 1000 mg/200 mL premix     1,000 mg 200 mL/hr over 60 Minutes Intravenous Every 8 hours 06/09/15 1045 06/16/15 1308   06/09/15 1930  piperacillin-tazobactam (ZOSYN) IVPB 3.375 g     3.375 g 12.5 mL/hr over 240 Minutes Intravenous 3 times per day 06/09/15 1045  06/16/15 1838   06/09/15 1100  piperacillin-tazobactam (ZOSYN) IVPB 3.375 g     3.375 g 100 mL/hr over 30 Minutes Intravenous  Once 06/09/15 1045 06/09/15 1200   06/09/15 1100  vancomycin (VANCOCIN) 1,500 mg in sodium chloride 0.9 % 500 mL IVPB     1,500 mg 250 mL/hr over 120 Minutes Intravenous  Once 06/09/15 1045 06/09/15 1330   06/06/15 1230  meropenem (MERREM) 1 g in sodium chloride 0.9 % 100 mL IVPB  Status:  Discontinued     1 g 200 mL/hr over 30 Minutes Intravenous Every 8 hours 06/06/15 1156 06/08/15 0935   06/02/15 1800  ertapenem (INVANZ) 1 g in sodium chloride 0.9 % 50 mL IVPB  Status:  Discontinued     1 g 100 mL/hr over 30 Minutes Intravenous Every 24 hours 06/02/15 1750 06/06/15 1156   05/26/15 2000  vancomycin (VANCOCIN) IVPB 1000 mg/200 mL premix     1,000 mg 200 mL/hr over 60 Minutes Intravenous Every 8 hours 05/26/15 0755 06/01/15 1233   05/26/15 0830  vancomycin (VANCOCIN) 1,500 mg in sodium chloride 0.9 % 500 mL IVPB     1,500 mg 250 mL/hr over 120 Minutes Intravenous  Once 05/26/15 0743 05/26/15 1021   05/26/15 0730  ertapenem (INVANZ) 1 g in sodium chloride 0.9 % 50 mL IVPB  Status:  Discontinued  1 g 100 mL/hr over 30 Minutes Intravenous Every 24 hours 05/26/15 0729 05/26/15 0741   05/20/15 1330  piperacillin-tazobactam (ZOSYN) IVPB 3.375 g  Status:  Discontinued     3.375 g 12.5 mL/hr over 240 Minutes Intravenous Every 8 hours 05/20/15 1321 05/31/15 1136   05/17/15 1000  ertapenem (INVANZ) 1 g in sodium chloride 0.9 % 50 mL IVPB  Status:  Discontinued     1 g 100 mL/hr over 30 Minutes Intravenous Every 24 hours 05/17/15 0936 05/17/15 0951   05/17/15 1000  meropenem (MERREM) 1 g in sodium chloride 0.9 % 100 mL IVPB  Status:  Discontinued     1 g 200 mL/hr over 30 Minutes Intravenous 3 times per day 05/17/15 0951 05/20/15 1321   05/15/15 0815  piperacillin-tazobactam (ZOSYN) IVPB 3.375 g  Status:  Discontinued     3.375 g 12.5 mL/hr over 240 Minutes  Intravenous 3 times per day 05/15/15 0804 05/17/15 0951   05/08/15 0200  ceFAZolin (ANCEF) IVPB 2 g/50 mL premix     2 g 100 mL/hr over 30 Minutes Intravenous 3 times per day 05/08/15 0140 05/09/15 2238   05/08/15 0130  ceFAZolin (ANCEF) powder 2 g  Status:  Discontinued     2 g Other Every 8 hours 05/08/15 0129 05/08/15 0139      Assessment/Plan: s/p Procedure(s): EXPLORATORY LAPAROTOMY with REMOVAL OF ABRA DEVICE Complete ABDOMINAL CLOSURE ILEOSTOMY Revision Advance diet Transfer to SDU Changed NPWD today Stop TPN  LOS: 47 days   Marta LamasJames O. Gae BonWyatt, [name], MD, FACS [phone] Trauma Surgeon 06/23/2015
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
## 2015-06-24 LAB — PROTIME-INR
INR: 1.21 (ref 0.00–1.49)
Prothrombin Time: 15.4 seconds — ABNORMAL HIGH (ref 11.6–15.2)

## 2015-06-24 LAB — CBC
HCT: 34.3 % — ABNORMAL LOW (ref 39.0–52.0)
Hemoglobin: 11 g/dL — ABNORMAL LOW (ref 13.0–17.0)
MCH: 27.7 pg (ref 26.0–34.0)
MCHC: 32.1 g/dL (ref 30.0–36.0)
MCV: 86.4 fL (ref 78.0–100.0)
Platelets: 548 10*3/uL — ABNORMAL HIGH (ref 150–400)
RBC: 3.97 MIL/uL — ABNORMAL LOW (ref 4.22–5.81)
RDW: 17.6 % — ABNORMAL HIGH (ref 11.5–15.5)
WBC: 15.3 10*3/uL — ABNORMAL HIGH (ref 4.0–10.5)

## 2015-06-24 LAB — HEPARIN LEVEL (UNFRACTIONATED): Heparin Unfractionated: 0.6 IU/mL (ref 0.30–0.70)
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
## 2015-06-24 NOTE — Progress Notes (Signed)
17 Days Post-Op  Subjective: Pt on TC  Looks comfortable  Chunky material from wound vac  Some smell according to nursing  Objective: Vital signs in last 24 hours: Temp:  [98 F (36.7 C)-99 F (37.2 C)] 98.2 F (36.8 C) (12/03 0400) Pulse Rate:  [97-130] 99 (12/03 0600) Resp:  [14-27] 16 (12/03 0600) BP: (116-134)/(77-100) 126/90 mmHg (12/03 0600) SpO2:  [96 %-100 %] 99 % (12/03 0600) FiO2 (%):  [21 %] 21 % (12/03 0456) Weight:  [74.1 kg (163 lb 5.8 oz)] 74.1 kg (163 lb 5.8 oz) (12/03 0400) Last BM Date: 06/23/15  Intake/Output from previous day: 12/02 0701 - 12/03 0700 In: 994 [P.O.:120; I.V.:374; TPN:500] Out: 2380 [Urine:1230; Drains:25; Stool:1125] Intake/Output this shift:    Resp: clear to auscultation bilaterally Cardio: regular rate and rhythm, S1, S2 normal, no murmur, click, rub or gallop Incision/Wound:ostomy swollen somewhat retracted  Wound vac in place chunky material noted   Lab Results:   Recent Labs  06/23/15 0327 06/24/15 0355  WBC 17.4* 15.3*  HGB 10.9* 11.0*  HCT 34.3* 34.3*  PLT 552* 548*   BMET  Recent Labs  06/22/15 0359  NA 136  K 3.0*  CL 94*  CO2 31  GLUCOSE 105*  BUN 18  CREATININE 0.94  CALCIUM 9.5   PT/INR  Recent Labs  06/23/15 0327 06/24/15 0355  LABPROT 15.9* 15.4*  INR 1.26 1.21   ABG No results for input(s): PHART, HCO3 in the last 72 hours.  Invalid input(s): PCO2, PO2  Studies/Results: No results found.  Anti-infectives: Anti-infectives    Start     Dose/Rate Route Frequency Ordered Stop   06/09/15 2000  vancomycin (VANCOCIN) IVPB 1000 mg/200 mL premix     1,000 mg 200 mL/hr over 60 Minutes Intravenous Every 8 hours 06/09/15 1045 06/16/15 1308   06/09/15 1930  piperacillin-tazobactam (ZOSYN) IVPB 3.375 g     3.375 g 12.5 mL/hr over 240 Minutes Intravenous 3 times per day 06/09/15 1045 06/16/15 1838   06/09/15 1100  piperacillin-tazobactam (ZOSYN) IVPB 3.375 g     3.375 g 100 mL/hr over 30 Minutes  Intravenous  Once 06/09/15 1045 06/09/15 1200   06/09/15 1100  vancomycin (VANCOCIN) 1,500 mg in sodium chloride 0.9 % 500 mL IVPB     1,500 mg 250 mL/hr over 120 Minutes Intravenous  Once 06/09/15 1045 06/09/15 1330   06/06/15 1230  meropenem (MERREM) 1 g in sodium chloride 0.9 % 100 mL IVPB  Status:  Discontinued     1 g 200 mL/hr over 30 Minutes Intravenous Every 8 hours 06/06/15 1156 06/08/15 0935   06/02/15 1800  ertapenem (INVANZ) 1 g in sodium chloride 0.9 % 50 mL IVPB  Status:  Discontinued     1 g 100 mL/hr over 30 Minutes Intravenous Every 24 hours 06/02/15 1750 06/06/15 1156   05/26/15 2000  vancomycin (VANCOCIN) IVPB 1000 mg/200 mL premix     1,000 mg 200 mL/hr over 60 Minutes Intravenous Every 8 hours 05/26/15 0755 06/01/15 1233   05/26/15 0830  vancomycin (VANCOCIN) 1,500 mg in sodium chloride 0.9 % 500 mL IVPB     1,500 mg 250 mL/hr over 120 Minutes Intravenous  Once 05/26/15 0743 05/26/15 1021   05/26/15 0730  ertapenem (INVANZ) 1 g in sodium chloride 0.9 % 50 mL IVPB  Status:  Discontinued     1 g 100 mL/hr over 30 Minutes Intravenous Every 24 hours 05/26/15 0729 05/26/15 0741   05/20/15 1330  piperacillin-tazobactam (ZOSYN) IVPB  3.375 g  Status:  Discontinued     3.375 g 12.5 mL/hr over 240 Minutes Intravenous Every 8 hours 05/20/15 1321 05/31/15 1136   05/17/15 1000  ertapenem (INVANZ) 1 g in sodium chloride 0.9 % 50 mL IVPB  Status:  Discontinued     1 g 100 mL/hr over 30 Minutes Intravenous Every 24 hours 05/17/15 0936 05/17/15 0951   05/17/15 1000  meropenem (MERREM) 1 g in sodium chloride 0.9 % 100 mL IVPB  Status:  Discontinued     1 g 200 mL/hr over 30 Minutes Intravenous 3 times per day 05/17/15 0951 05/20/15 1321   05/15/15 0815  piperacillin-tazobactam (ZOSYN) IVPB 3.375 g  Status:  Discontinued     3.375 g 12.5 mL/hr over 240 Minutes Intravenous 3 times per day 05/15/15 0804 05/17/15 0951   05/08/15 0200  ceFAZolin (ANCEF) IVPB 2 g/50 mL premix     2  g 100 mL/hr over 30 Minutes Intravenous 3 times per day 05/08/15 0140 05/09/15 2238   05/08/15 0130  ceFAZolin (ANCEF) powder 2 g  Status:  Discontinued     2 g Other Every 8 hours 05/08/15 0129 05/08/15 0139      Assessment/Plan: s/p Procedure(s): EXPLORATORY LAPAROTOMY with REMOVAL OF ABRA DEVICE (N/A) Complete ABDOMINAL CLOSURE (N/A) ILEOSTOMY Revision (Right) Change wound vac OR next week for possible STSG To stepdown   LOS: 48 days    Rosco Harriott A. 06/24/2015

## 2015-06-25 LAB — CBC
HCT: 33.5 % — ABNORMAL LOW (ref 39.0–52.0)
Hemoglobin: 10.6 g/dL — ABNORMAL LOW (ref 13.0–17.0)
MCH: 27.5 pg (ref 26.0–34.0)
MCHC: 31.6 g/dL (ref 30.0–36.0)
MCV: 86.8 fL (ref 78.0–100.0)
Platelets: 528 10*3/uL — ABNORMAL HIGH (ref 150–400)
RBC: 3.86 MIL/uL — ABNORMAL LOW (ref 4.22–5.81)
RDW: 17.6 % — ABNORMAL HIGH (ref 11.5–15.5)
WBC: 14.9 10*3/uL — ABNORMAL HIGH (ref 4.0–10.5)

## 2015-06-25 LAB — HEPARIN LEVEL (UNFRACTIONATED): Heparin Unfractionated: 0.6 IU/mL (ref 0.30–0.70)

## 2015-06-25 MED ORDER — CHLORHEXIDINE GLUCONATE 0.12 % MT SOLN
15.0000 mL | Freq: Two times a day (BID) | OROMUCOSAL | Status: DC
Start: 2015-06-25 — End: 2015-06-26
  Administered 2015-06-25: 15 mL via OROMUCOSAL
  Filled 2015-06-25 (×2): qty 15

## 2015-06-25 MED ORDER — CETYLPYRIDINIUM CHLORIDE 0.05 % MT LIQD: 7.0000 mL | Freq: Two times a day (BID) | OROMUCOSAL | Status: DC

## 2015-06-25 MED ORDER — FUROSEMIDE 10 MG/ML IJ SOLN
40.0000 mg | Freq: Two times a day (BID) | INTRAMUSCULAR | Status: DC
  Administered 2015-06-25 – 2015-06-27 (×5): 40 mg via INTRAVENOUS
  Filled 2015-06-25 (×5): qty 4
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
## 2015-06-25 NOTE — Progress Notes (Signed)
18 Days Post-Op  Subjective: Pt with no acute changes. Feeling of fullness. C/o R foot pain.  Objective: Vital signs in last 24 hours: Temp:  [98 F (36.7 C)-99.2 F (37.3 C)] 99.2 F (37.3 C) (12/04 0724) Pulse Rate:  [95-140] 114 (12/04 0700) Resp:  [12-22] 19 (12/04 0700) BP: (101-145)/(79-108) 116/90 mmHg (12/04 0700) SpO2:  [95 %-100 %] 100 % (12/04 0700) FiO2 (%):  [21 %] 21 % (12/03 1900) Weight:  [74.3 kg (163 lb 12.8 oz)] 74.3 kg (163 lb 12.8 oz) (12/04 0400) Last BM Date: 06/24/15  Intake/Output from previous day: 12/03 0701 - 12/04 0700 In: 1498 [P.O.:1080; I.V.:418] Out: 2160 [Urine:1250; Drains:80; Stool:830] Intake/Output this shift: Total I/O In: 17 [I.V.:17] Out: -   General appearance: alert and cooperative GI: soft, vac in place, ostomy patent B feet extended  Lab Results:   Recent Labs  06/24/15 0355 06/25/15 0335  WBC 15.3* 14.9*  HGB 11.0* 10.6*  HCT 34.3* 33.5*  PLT 548* 528*   BMET No results for input(s): NA, K, CL, CO2, GLUCOSE, BUN, CREATININE, CALCIUM in the last 72 hours. PT/INR  Recent Labs  06/23/15 0327 06/24/15 0355  LABPROT 15.9* 15.4*  INR 1.26 1.21   ABG No results for input(s): PHART, HCO3 in the last 72 hours.  Invalid input(s): PCO2, PO2  Studies/Results: No results found.  Anti-infectives: Anti-infectives    Start     Dose/Rate Route Frequency Ordered Stop   06/09/15 2000  vancomycin (VANCOCIN) IVPB 1000 mg/200 mL premix     1,000 mg 200 mL/hr over 60 Minutes Intravenous Every 8 hours 06/09/15 1045 06/16/15 1308   06/09/15 1930  piperacillin-tazobactam (ZOSYN) IVPB 3.375 g     3.375 g 12.5 mL/hr over 240 Minutes Intravenous 3 times per day 06/09/15 1045 06/16/15 1838   06/09/15 1100  piperacillin-tazobactam (ZOSYN) IVPB 3.375 g     3.375 g 100 mL/hr over 30 Minutes Intravenous  Once 06/09/15 1045 06/09/15 1200   06/09/15 1100  vancomycin (VANCOCIN) 1,500 mg in sodium chloride 0.9 % 500 mL IVPB     1,500  mg 250 mL/hr over 120 Minutes Intravenous  Once 06/09/15 1045 06/09/15 1330   06/06/15 1230  meropenem (MERREM) 1 g in sodium chloride 0.9 % 100 mL IVPB  Status:  Discontinued     1 g 200 mL/hr over 30 Minutes Intravenous Every 8 hours 06/06/15 1156 06/08/15 0935   06/02/15 1800  ertapenem (INVANZ) 1 g in sodium chloride 0.9 % 50 mL IVPB  Status:  Discontinued     1 g 100 mL/hr over 30 Minutes Intravenous Every 24 hours 06/02/15 1750 06/06/15 1156   05/26/15 2000  vancomycin (VANCOCIN) IVPB 1000 mg/200 mL premix     1,000 mg 200 mL/hr over 60 Minutes Intravenous Every 8 hours 05/26/15 0755 06/01/15 1233   05/26/15 0830  vancomycin (VANCOCIN) 1,500 mg in sodium chloride 0.9 % 500 mL IVPB     1,500 mg 250 mL/hr over 120 Minutes Intravenous  Once 05/26/15 0743 05/26/15 1021   05/26/15 0730  ertapenem (INVANZ) 1 g in sodium chloride 0.9 % 50 mL IVPB  Status:  Discontinued     1 g 100 mL/hr over 30 Minutes Intravenous Every 24 hours 05/26/15 0729 05/26/15 0741   05/20/15 1330  piperacillin-tazobactam (ZOSYN) IVPB 3.375 g  Status:  Discontinued     3.375 g 12.5 mL/hr over 240 Minutes Intravenous Every 8 hours 05/20/15 1321 05/31/15 1136   05/17/15 1000  ertapenem (INVANZ) 1  g in sodium chloride 0.9 % 50 mL IVPB  Status:  Discontinued     1 g 100 mL/hr over 30 Minutes Intravenous Every 24 hours 05/17/15 0936 05/17/15 0951   05/17/15 1000  meropenem (MERREM) 1 g in sodium chloride 0.9 % 100 mL IVPB  Status:  Discontinued     1 g 200 mL/hr over 30 Minutes Intravenous 3 times per day 05/17/15 0951 05/20/15 1321   05/15/15 0815  piperacillin-tazobactam (ZOSYN) IVPB 3.375 g  Status:  Discontinued     3.375 g 12.5 mL/hr over 240 Minutes Intravenous 3 times per day 05/15/15 0804 05/17/15 0951   05/08/15 0200  ceFAZolin (ANCEF) IVPB 2 g/50 mL premix     2 g 100 mL/hr over 30 Minutes Intravenous 3 times per day 05/08/15 0140 05/09/15 2238   05/08/15 0130  ceFAZolin (ANCEF) powder 2 g  Status:   Discontinued     2 g Other Every 8 hours 05/08/15 0129 05/08/15 0139      Assessment/Plan: s/p Procedure(s): EXPLORATORY LAPAROTOMY with REMOVAL OF ABRA DEVICE (N/A) Complete ABDOMINAL CLOSURE (N/A) ILEOSTOMY Revision (Right) Change wound vac OR next week for possible STSG Prevalon boots for foot drop/pain SDU bed pending    LOS: 49 days    Marigene Ehlersamirez Jr., Jed Limerickrmando 06/25/2015
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
## 2015-06-25 NOTE — Progress Notes (Signed)
Orthopedic Tech Progress Note Patient Details:  Raymond Bowen [birth year] [id number]  Ortho Devices Ortho Device/Splint Location: Bilateral Prafo boots Ortho Device/Splint Interventions: Application   Saul FordyceJennifer C Mayzie Caughlin 06/25/2015, 3:45 PM

## 2015-06-26 ENCOUNTER — Encounter (HOSPITAL_COMMUNITY): Payer: Self-pay | Admitting: Certified Registered Nurse Anesthetist

## 2015-06-26 LAB — CBC
HCT: 34.5 % — ABNORMAL LOW (ref 39.0–52.0)
Hemoglobin: 11.3 g/dL — ABNORMAL LOW (ref 13.0–17.0)
MCH: 28 pg (ref 26.0–34.0)
MCHC: 32.8 g/dL (ref 30.0–36.0)
MCV: 85.6 fL (ref 78.0–100.0)
Platelets: 512 10*3/uL — ABNORMAL HIGH (ref 150–400)
RBC: 4.03 MIL/uL — ABNORMAL LOW (ref 4.22–5.81)
RDW: 17.4 % — ABNORMAL HIGH (ref 11.5–15.5)
WBC: 14.3 10*3/uL — ABNORMAL HIGH (ref 4.0–10.5)

## 2015-06-26 LAB — HEPARIN LEVEL (UNFRACTIONATED): Heparin Unfractionated: 0.41 IU/mL (ref 0.30–0.70)
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
## 2015-06-26 NOTE — Progress Notes (Signed)
Inpatient Rehabilitation  Pt. For STSG tomorrow.  Will continue to follow.    Weldon PickingSusan Lylee Corrow PT Inpatient Rehab Admissions Coordinator Cell [phone] Office [phone]
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
## 2015-06-26 NOTE — Progress Notes (Signed)
Pt asked this RN to call his mother before he would sign consent for procedure. Called pt's mother Raymond Bowen, all questions answered, she stated that the pt does not need to be signing any consents. Pt is alert and oriented x 4. Pt's mother stated she would make a phone call and then call the unit back. Awaiting phone call at this time. Will continue to monitor pt.  UPDATE: Called pt's mother at 2030, she stated she will be arriving to pt's room between 0430 and 0500 to discuss with him before signing consent for procedure. Will continue to monitor.
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
## 2015-06-27 ENCOUNTER — Inpatient Hospital Stay (HOSPITAL_COMMUNITY): Payer: No Typology Code available for payment source | Admitting: Anesthesiology

## 2015-06-27 ENCOUNTER — Inpatient Hospital Stay (HOSPITAL_COMMUNITY): Admission: RE | Admit: 2015-06-27 | Payer: MEDICAID | Source: Ambulatory Visit | Admitting: General Surgery

## 2015-06-27 ENCOUNTER — Encounter (HOSPITAL_COMMUNITY): Admission: EM | Disposition: A | Discharge status: Rehabilitation Facility | Payer: Self-pay | Source: Home / Self Care

## 2015-06-27 HISTORY — PX: SKIN SPLIT GRAFT: SHX444

## 2015-06-27 LAB — GLUCOSE, CAPILLARY: Glucose-Capillary: 135 mg/dL — ABNORMAL HIGH (ref 65–99)

## 2015-06-27 LAB — CBC
HCT: 33.8 % — ABNORMAL LOW (ref 39.0–52.0)
Hemoglobin: 11.1 g/dL — ABNORMAL LOW (ref 13.0–17.0)
MCH: 27.8 pg (ref 26.0–34.0)
MCHC: 32.8 g/dL (ref 30.0–36.0)
MCV: 84.5 fL (ref 78.0–100.0)
Platelets: 477 10*3/uL — ABNORMAL HIGH (ref 150–400)
RBC: 4 MIL/uL — ABNORMAL LOW (ref 4.22–5.81)
RDW: 17.3 % — ABNORMAL HIGH (ref 11.5–15.5)
WBC: 12.2 10*3/uL — ABNORMAL HIGH (ref 4.0–10.5)

## 2015-06-27 LAB — HEPARIN LEVEL (UNFRACTIONATED): Heparin Unfractionated: 0.19 IU/mL — ABNORMAL LOW (ref 0.30–0.70)

## 2015-06-27 SURGERY — APPLICATION, GRAFT, SKIN, SPLIT-THICKNESS
Anesthesia: General | Site: Abdomen

## 2015-06-27 MED ORDER — PROMETHAZINE HCL 25 MG/ML IJ SOLN
6.2500 mg | INTRAMUSCULAR | Status: DC | PRN
  Administered 2015-06-27: 6.25 mg via INTRAVENOUS

## 2015-06-27 MED ORDER — SODIUM CHLORIDE 0.9 % IR SOLN
Status: DC | PRN
  Administered 2015-06-27: 1000 mL

## 2015-06-27 MED ORDER — SODIUM CHLORIDE 0.9 % IJ SOLN
INTRAMUSCULAR | Status: AC
  Filled 2015-06-27: qty 10

## 2015-06-27 MED ORDER — ONDANSETRON HCL 4 MG/2ML IJ SOLN
INTRAMUSCULAR | Status: DC | PRN
  Administered 2015-06-27: 4 mg via INTRAVENOUS

## 2015-06-27 MED ORDER — MINERAL OIL LIGHT 100 % EX OIL
TOPICAL_OIL | CUTANEOUS | Status: DC | PRN
  Administered 2015-06-27: 1 via TOPICAL

## 2015-06-27 MED ORDER — ROCURONIUM BROMIDE 100 MG/10ML IV SOLN
INTRAVENOUS | Status: DC | PRN
  Administered 2015-06-27: 40 mg via INTRAVENOUS

## 2015-06-27 MED ORDER — HYDROMORPHONE HCL 1 MG/ML IJ SOLN
0.2500 mg | INTRAMUSCULAR | Status: DC | PRN
  Administered 2015-06-27 (×4): 0.5 mg via INTRAVENOUS

## 2015-06-27 MED ORDER — ONDANSETRON HCL 4 MG/2ML IJ SOLN
INTRAMUSCULAR | Status: AC
  Filled 2015-06-27: qty 4

## 2015-06-27 MED ORDER — LIDOCAINE HCL (CARDIAC) 20 MG/ML IV SOLN
INTRAVENOUS | Status: AC
  Filled 2015-06-27: qty 5

## 2015-06-27 MED ORDER — MINERAL OIL LIGHT 100 % EX OIL
TOPICAL_OIL | CUTANEOUS | Status: AC
  Filled 2015-06-27: qty 25

## 2015-06-27 MED ORDER — LACTATED RINGERS IV SOLN
INTRAVENOUS | Status: DC | PRN
  Administered 2015-06-27: 08:00:00 via INTRAVENOUS

## 2015-06-27 MED ORDER — PHENYLEPHRINE HCL 10 MG/ML IJ SOLN
INTRAMUSCULAR | Status: DC | PRN
  Administered 2015-06-27: 80 ug via INTRAVENOUS
  Administered 2015-06-27: 800 ug via INTRAVENOUS
  Administered 2015-06-27 (×2): 80 ug via INTRAVENOUS

## 2015-06-27 MED ORDER — LACTATED RINGERS IV SOLN
INTRAVENOUS | Status: DC
  Administered 2015-06-27: 08:00:00 via INTRAVENOUS

## 2015-06-27 MED ORDER — PROPOFOL 10 MG/ML IV BOLUS
INTRAVENOUS | Status: DC | PRN
  Administered 2015-06-27: 150 mg via INTRAVENOUS

## 2015-06-27 MED ORDER — MIDAZOLAM HCL 2 MG/2ML IJ SOLN
INTRAMUSCULAR | Status: AC
  Filled 2015-06-27: qty 2

## 2015-06-27 MED ORDER — MIDAZOLAM HCL 5 MG/5ML IJ SOLN
INTRAMUSCULAR | Status: DC | PRN
  Administered 2015-06-27: 2 mg via INTRAVENOUS

## 2015-06-27 MED ORDER — HEPARIN (PORCINE) IN NACL 100-0.45 UNIT/ML-% IJ SOLN
1700.0000 [IU]/h | INTRAMUSCULAR | Status: AC
  Administered 2015-06-27 – 2015-06-28 (×2): 1700 [IU]/h via INTRAVENOUS
  Filled 2015-06-27 (×2): qty 250

## 2015-06-27 MED ORDER — CEFAZOLIN SODIUM-DEXTROSE 2-3 GM-% IV SOLR
2.0000 g | Freq: Once | INTRAVENOUS | Status: AC
  Administered 2015-06-27: 2 g via INTRAVENOUS
  Filled 2015-06-27: qty 50

## 2015-06-27 MED ORDER — ARTIFICIAL TEARS OP OINT
TOPICAL_OINTMENT | OPHTHALMIC | Status: AC
  Filled 2015-06-27: qty 3.5

## 2015-06-27 MED ORDER — EPINEPHRINE HCL 1 MG/ML IJ SOLN
INTRAMUSCULAR | Status: AC
  Filled 2015-06-27: qty 1

## 2015-06-27 MED ORDER — SODIUM CHLORIDE 0.9 % IR SOLN
Status: DC | PRN
  Administered 2015-06-27: 251 mL

## 2015-06-27 MED ORDER — SUCCINYLCHOLINE CHLORIDE 20 MG/ML IJ SOLN
INTRAMUSCULAR | Status: AC
  Filled 2015-06-27: qty 1

## 2015-06-27 MED ORDER — SUGAMMADEX SODIUM 200 MG/2ML IV SOLN
INTRAVENOUS | Status: AC
  Filled 2015-06-27: qty 2

## 2015-06-27 MED ORDER — FENTANYL CITRATE (PF) 250 MCG/5ML IJ SOLN
INTRAMUSCULAR | Status: AC
  Filled 2015-06-27: qty 5

## 2015-06-27 MED ORDER — EPHEDRINE SULFATE 50 MG/ML IJ SOLN
INTRAMUSCULAR | Status: AC
  Filled 2015-06-27: qty 1

## 2015-06-27 MED ORDER — PROPOFOL 10 MG/ML IV BOLUS
INTRAVENOUS | Status: AC
  Filled 2015-06-27: qty 20

## 2015-06-27 MED ORDER — CEFAZOLIN SODIUM-DEXTROSE 2-3 GM-% IV SOLR
INTRAVENOUS | Status: AC
  Filled 2015-06-27: qty 50

## 2015-06-27 MED ORDER — BACITRACIN ZINC 500 UNIT/GM EX OINT
TOPICAL_OINTMENT | CUTANEOUS | Status: AC
  Filled 2015-06-27: qty 28.35

## 2015-06-27 MED ORDER — ROCURONIUM BROMIDE 50 MG/5ML IV SOLN
INTRAVENOUS | Status: AC
  Filled 2015-06-27: qty 2

## 2015-06-27 MED ORDER — FENTANYL CITRATE (PF) 100 MCG/2ML IJ SOLN
INTRAMUSCULAR | Status: DC | PRN
  Administered 2015-06-27: 100 ug via INTRAVENOUS

## 2015-06-27 MED ORDER — SUGAMMADEX SODIUM 200 MG/2ML IV SOLN
INTRAVENOUS | Status: DC | PRN
  Administered 2015-06-27: 140.4 mg via INTRAVENOUS

## 2015-06-27 SURGICAL SUPPLY — 54 items
BLADE DERMATOME II (BLADE) ×1 IMPLANT
CANISTER SUCTION 2500CC (MISCELLANEOUS) IMPLANT
COVER SURGICAL LIGHT HANDLE (MISCELLANEOUS) ×2 IMPLANT
DERMACARRIERS GRAFT 1 TO 1.5 (DISPOSABLE) ×4
DRAPE EXTREMITY T 121X128X90 (DRAPE) IMPLANT
DRAPE ORTHO SPLIT 77X108 STRL (DRAPES)
DRAPE PED LAPAROTOMY (DRAPES) IMPLANT
DRAPE PROXIMA HALF (DRAPES) IMPLANT
DRAPE SURG ORHT 6 SPLT 77X108 (DRAPES) IMPLANT
DRAPE UTILITY XL STRL (DRAPES) ×4 IMPLANT
DRESSING TELFA 8X3 (GAUZE/BANDAGES/DRESSINGS) IMPLANT
DRSG ADAPTIC 3X8 NADH LF (GAUZE/BANDAGES/DRESSINGS) ×1 IMPLANT
DRSG MEPITEL 4X7.2 (GAUZE/BANDAGES/DRESSINGS) ×1 IMPLANT
DRSG PAD ABDOMINAL 8X10 ST (GAUZE/BANDAGES/DRESSINGS) IMPLANT
DRSG TELFA 3X8 NADH (GAUZE/BANDAGES/DRESSINGS) ×4 IMPLANT
DRSG VAC ATS MED SENSATRAC (GAUZE/BANDAGES/DRESSINGS) ×1 IMPLANT
ELECT CAUTERY BLADE 6.4 (BLADE) ×3 IMPLANT
ELECT REM PT RETURN 9FT ADLT (ELECTROSURGICAL)
ELECTRODE REM PT RTRN 9FT ADLT (ELECTROSURGICAL) IMPLANT
GAUZE SPONGE 4X4 12PLY STRL (GAUZE/BANDAGES/DRESSINGS) ×2 IMPLANT
GAUZE SPONGE 4X4 16PLY XRAY LF (GAUZE/BANDAGES/DRESSINGS) ×2 IMPLANT
GAUZE XEROFORM 5X9 LF (GAUZE/BANDAGES/DRESSINGS) ×1 IMPLANT
GLOVE BIO SURGEON STRL SZ7 (GLOVE) ×1 IMPLANT
GLOVE BIO SURGEON STRL SZ8 (GLOVE) ×3 IMPLANT
GLOVE BIOGEL PI IND STRL 8 (GLOVE) ×1 IMPLANT
GLOVE BIOGEL PI INDICATOR 8 (GLOVE) ×2
GLOVE ECLIPSE 7.5 STRL STRAW (GLOVE) ×1 IMPLANT
GOWN STRL REUS W/ TWL LRG LVL3 (GOWN DISPOSABLE) ×1 IMPLANT
GOWN STRL REUS W/ TWL XL LVL3 (GOWN DISPOSABLE) ×1 IMPLANT
GOWN STRL REUS W/TWL LRG LVL3 (GOWN DISPOSABLE) ×4
GOWN STRL REUS W/TWL XL LVL3 (GOWN DISPOSABLE) ×2
GRAFT DERMACARRIERS 1 TO 1.5 (DISPOSABLE) ×1 IMPLANT
KIT BASIN OR (CUSTOM PROCEDURE TRAY) ×2 IMPLANT
KIT OSTOMY DRAINABLE 2.75 STR (WOUND CARE) ×1 IMPLANT
KIT ROOM TURNOVER OR (KITS) ×2 IMPLANT
NDL FILTER BLUNT 18X1 1/2 (NEEDLE) ×1 IMPLANT
NEEDLE FILTER BLUNT 18X 1/2SAF (NEEDLE) ×1
NEEDLE FILTER BLUNT 18X1 1/2 (NEEDLE) ×1 IMPLANT
NS IRRIG 1000ML POUR BTL (IV SOLUTION) ×2 IMPLANT
PACK SURGICAL SETUP 50X90 (CUSTOM PROCEDURE TRAY) ×2 IMPLANT
PAD ARMBOARD 7.5X6 YLW CONV (MISCELLANEOUS) ×4 IMPLANT
PAD DRESSING TELFA 3X8 NADH (GAUZE/BANDAGES/DRESSINGS) IMPLANT
PENCIL BUTTON HOLSTER BLD 10FT (ELECTRODE) ×1 IMPLANT
SPONGE LAP 18X18 X RAY DECT (DISPOSABLE) ×1 IMPLANT
STAPLER VISISTAT 35W (STAPLE) ×3 IMPLANT
SUT ETHILON 3 0 FSL (SUTURE) IMPLANT
SUT SILK 3 0 SH 30 (SUTURE) IMPLANT
SYR TB 1ML LUER SLIP (SYRINGE) ×2 IMPLANT
TOWEL OR 17X24 6PK STRL BLUE (TOWEL DISPOSABLE) ×2 IMPLANT
TOWEL OR 17X26 10 PK STRL BLUE (TOWEL DISPOSABLE) ×2 IMPLANT
TUBE CONNECTING 12X1/4 (SUCTIONS) ×1 IMPLANT
UNDERPAD 30X30 INCONTINENT (UNDERPADS AND DIAPERS) IMPLANT
WATER STERILE IRR 1000ML POUR (IV SOLUTION) IMPLANT
YANKAUER SUCT BULB TIP NO VENT (SUCTIONS) ×1 IMPLANT
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
## 2015-06-27 NOTE — Transfer of Care (Signed)
Immediate Anesthesia Transfer of Care Note  Patient: Raymond LeaverBrian Bramlett  Procedure(s) Performed: Procedure(s): SKIN GRAFT SPLIT THICKNESS TO ABDOMEN (N/A)  Patient Location: PACU  Anesthesia Type:General  Level of Consciousness: awake, alert  and oriented  Airway & Oxygen Therapy: Patient Spontanous Breathing and Patient connected to tracheostomy mask oxygen  Post-op Assessment: Report given to RN, Post -op Vital signs reviewed and stable and Patient moving all extremities X 4  Post vital signs: Reviewed and stable  Last Vitals:  Filed Vitals:   06/27/15 0418 06/27/15 0714  BP: 123/94 116/80  Pulse: 116 116  Temp: 36.9 C 36.9 C  Resp: 16 21    Complications: No apparent anesthesia complications
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
## 2015-06-27 NOTE — Anesthesia Postprocedure Evaluation (Deleted)
Anesthesia Post Note  Patient: Raymond Bowen  Procedure(s) Performed: Procedure(s) (LRB): SKIN GRAFT SPLIT THICKNESS TO ABDOMEN (N/A)  Patient location during evaluation: PACU Anesthesia Type: General Level of consciousness: sedated Pain management: pain level controlled Vital Signs Assessment: post-procedure vital signs reviewed and stable Respiratory status: spontaneous breathing and respiratory function stable Cardiovascular status: stable Anesthetic complications: no    Last Vitals:  Filed Vitals:   06/27/15 1030 06/27/15 1047  BP: 133/97 134/91  Pulse: 102 103  Temp: 36.7 C 36.4 C  Resp: 15 13    Last Pain:  Filed Vitals:   06/27/15 1048  PainSc: 10-Worst pain ever                 Rose-Marie Hickling DANIEL      

## 2015-06-27 NOTE — Anesthesia Postprocedure Evaluation (Signed)
Anesthesia Post Note  Patient: Raymond Bowen  Procedure(s) Performed: Procedure(s) (LRB): SKIN GRAFT SPLIT THICKNESS TO ABDOMEN (N/A)  Patient location during evaluation: PACU Anesthesia Type: General Level of consciousness: sedated Pain management: pain level controlled Vital Signs Assessment: post-procedure vital signs reviewed and stable Respiratory status: spontaneous breathing and respiratory function stable Cardiovascular status: stable Anesthetic complications: no    Last Vitals:  Filed Vitals:   06/27/15 1030 06/27/15 1047  BP: 133/97 134/91  Pulse: 102 103  Temp: 36.7 C 36.4 C  Resp: 15 13    Last Pain:  Filed Vitals:   06/27/15 1048  PainSc: 10-Worst pain ever                 Vernie Piet DANIEL

## 2015-06-27 NOTE — Progress Notes (Signed)
Pt CBG 135 

## 2015-06-27 NOTE — Progress Notes (Signed)
Consent for OR procedure signed by pt's mother.

## 2015-06-27 NOTE — Progress Notes (Signed)
Pt is stable at this time, no distress noted. Pt watching tv. Airway is patent.

## 2015-06-27 NOTE — Progress Notes (Signed)
RT Note: Rt is unable to assess patient this AM because patient is off of unit 3S this morning and is in the OR currently per patients RN. Respiratory therapy will assess patient once he is back on the unit.
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
## 2015-06-27 NOTE — Progress Notes (Signed)
Pt's mother at bedside.

## 2015-06-27 NOTE — Progress Notes (Signed)
Left thigh dressing with new drainage.  Drainage marked.

## 2015-06-27 NOTE — Anesthesia Preprocedure Evaluation (Addendum)
Anesthesia Evaluation  Patient identified by MRN, date of birth, ID band Patient awake    Reviewed: Allergy & Precautions, NPO status , Patient's Chart, lab work & pertinent test results  History of Anesthesia Complications Negative for: history of anesthetic complications  Airway Mallampati: Trach  TM Distance: >3 FB     Dental no notable dental hx. (+) Dental Advidsory Given   Pulmonary neg pulmonary ROS, Current Smoker,    Pulmonary exam normal        Cardiovascular negative cardio ROS   Rhythm:Regular Rate:Normal     Neuro/Psych negative neurological ROS  negative psych ROS   GI/Hepatic negative GI ROS, Neg liver ROS,   Endo/Other  negative endocrine ROS  Renal/GU negative Renal ROS  negative genitourinary   Musculoskeletal negative musculoskeletal ROS (+)   Abdominal   Peds negative pediatric ROS (+)  Hematology negative hematology ROS (+) anemia ,   Anesthesia Other Findings Left arm PICC  Reproductive/Obstetrics negative OB ROS                           Lab Results  Component Value Date   WBC 12.2* 06/27/2015   HGB 11.1* 06/27/2015   HCT 33.8* 06/27/2015   MCV 84.5 06/27/2015   PLT 477* 06/27/2015   Lab Results  Component Value Date   CREATININE 0.94 06/22/2015   BUN 18 06/22/2015   NA 136 06/22/2015   K 3.0* 06/22/2015   CL 94* 06/22/2015   CO2 31 06/22/2015   Lab Results  Component Value Date   INR 1.21 06/24/2015   INR 1.26 06/23/2015   INR 1.28 06/22/2015     Anesthesia Physical  Anesthesia Plan  ASA: [name]  Anesthesia Plan: General   Post-op Pain Management:    Induction: Inhalational and Intravenous  Airway Management Planned: Tracheostomy  Additional Equipment:   Intra-op Plan:   Post-operative Plan: Post-operative intubation/ventilation  Informed Consent: I have reviewed the patients History and Physical, chart, labs and discussed the  procedure including the risks, benefits and alternatives for the proposed anesthesia with the patient or authorized representative who has indicated his/her understanding and acceptance.   Dental Advisory Given  Plan Discussed with: CRNA, Surgeon and Anesthesiologist  Anesthesia Plan Comments:       Anesthesia Quick Evaluation

## 2015-06-27 NOTE — Anesthesia Procedure Notes (Signed)
Procedure Name: MAC Date/Time: 06/27/2015 8:50 AM Performed by: Bronson IngMARTINELLI, Kimberla Driskill F Patient Re-evaluated:Patient Re-evaluated prior to inductionOxygen Delivery Method: Circle system utilized Preoxygenation: Pre-oxygenation with 100% oxygen (Connected to gas machine via tracheostomy) Intubation Type: IV induction

## 2015-06-28 ENCOUNTER — Encounter (HOSPITAL_COMMUNITY): Payer: Self-pay | Admitting: General Surgery

## 2015-06-28 DIAGNOSIS — I82401 Acute embolism and thrombosis of unspecified deep veins of right lower extremity: Secondary | ICD-10-CM | POA: Insufficient documentation

## 2015-06-28 DIAGNOSIS — I82509 Chronic embolism and thrombosis of unspecified deep veins of unspecified lower extremity: Secondary | ICD-10-CM | POA: Diagnosis not present

## 2015-06-28 LAB — CBC
HCT: 33 % — ABNORMAL LOW (ref 39.0–52.0)
Hemoglobin: 11.1 g/dL — ABNORMAL LOW (ref 13.0–17.0)
MCH: 28.5 pg (ref 26.0–34.0)
MCHC: 33.6 g/dL (ref 30.0–36.0)
MCV: 84.6 fL (ref 78.0–100.0)
Platelets: 426 10*3/uL — ABNORMAL HIGH (ref 150–400)
RBC: 3.9 MIL/uL — ABNORMAL LOW (ref 4.22–5.81)
RDW: 17.4 % — ABNORMAL HIGH (ref 11.5–15.5)
WBC: 15.2 10*3/uL — ABNORMAL HIGH (ref 4.0–10.5)

## 2015-06-28 LAB — BASIC METABOLIC PANEL
Anion gap: 12 (ref 5–15)
BUN: 15 mg/dL (ref 6–20)
CO2: 27 mmol/L (ref 22–32)
Calcium: 9.8 mg/dL (ref 8.9–10.3)
Chloride: 88 mmol/L — ABNORMAL LOW (ref 101–111)
Creatinine, Ser: 1.04 mg/dL (ref 0.61–1.24)
GFR calc Af Amer: >60 mL/min (ref >60)
GFR calc non Af Amer: >60 mL/min (ref >60)
Glucose, Bld: 127 mg/dL — ABNORMAL HIGH (ref 65–99)
Potassium: 3.3 mmol/L — ABNORMAL LOW (ref 3.5–5.1)
Sodium: 127 mmol/L — ABNORMAL LOW (ref 135–145)

## 2015-06-28 LAB — HEPARIN LEVEL (UNFRACTIONATED): Heparin Unfractionated: 0.37 IU/mL (ref 0.30–0.70)

## 2015-06-28 MED ORDER — QUETIAPINE FUMARATE 50 MG PO TABS
50.0000 mg | ORAL_TABLET | Freq: Every day | ORAL | Status: DC
  Administered 2015-06-28: 50 mg via ORAL
  Filled 2015-06-28: qty 1

## 2015-06-28 MED ORDER — METOCLOPRAMIDE HCL 10 MG PO TABS
10.0000 mg | ORAL_TABLET | Freq: Three times a day (TID) | ORAL | Status: DC
  Administered 2015-06-28 – 2015-06-29 (×3): 10 mg via ORAL
  Filled 2015-06-28 (×3): qty 1

## 2015-06-28 MED ORDER — WARFARIN - PHARMACIST DOSING INPATIENT: Freq: Every day | Status: DC

## 2015-06-28 MED ORDER — OXYCODONE HCL 5 MG PO TABS
10.0000 mg | ORAL_TABLET | ORAL | Status: DC | PRN
  Administered 2015-06-28 – 2015-07-03 (×16): 20 mg via ORAL
  Filled 2015-06-28 (×17): qty 4

## 2015-06-28 MED ORDER — DABIGATRAN ETEXILATE MESYLATE 150 MG PO CAPS
150.0000 mg | ORAL_CAPSULE | Freq: Two times a day (BID) | ORAL | Status: DC
  Administered 2015-06-28 – 2015-07-03 (×11): 150 mg via ORAL
  Filled 2015-06-28 (×13): qty 1

## 2015-06-28 MED ORDER — FUROSEMIDE 10 MG/ML IJ SOLN
20.0000 mg | Freq: Two times a day (BID) | INTRAMUSCULAR | Status: DC
  Administered 2015-06-28 – 2015-06-29 (×4): 20 mg via INTRAVENOUS
  Filled 2015-06-28 (×4): qty 2

## 2015-06-28 MED ORDER — WARFARIN SODIUM 7.5 MG PO TABS: 15.0000 mg | ORAL_TABLET | Freq: Once | ORAL | Status: DC

## 2015-06-28 MED ORDER — HYDROMORPHONE HCL 1 MG/ML IJ SOLN
1.0000 mg | INTRAMUSCULAR | Status: DC | PRN
  Administered 2015-06-28 – 2015-06-30 (×10): 1 mg via INTRAVENOUS
  Filled 2015-06-28 (×10): qty 1
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
## 2015-06-28 NOTE — Progress Notes (Signed)
Pt transferred to 6N24.

## 2015-06-28 NOTE — Progress Notes (Signed)
Notified pts mother of pt transfer.
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
## 2015-06-28 NOTE — Progress Notes (Signed)
Patient changed from a #6 shiley cuffed to a #4 cuffless shiley without difficulty. Vital signs remained stable. Placed aerosol trach collar on room air for humidification. PMV on at this time

## 2015-06-28 NOTE — Progress Notes (Addendum)
Pt transferred to 6N24 via bed from 3S.  Pt AA) X4.  Pt on RA.  Pt has #4 shiley trach. Pt has Stage II pressure ulcer to neck under trach. Pt has TL Picc to LUA.  Pt has abd wound with wound vac in place.  Pt has ileostomy to RUQ.  Pt has dry dsg to Lt thigh in place.  Report rcvd from Indioracie, Charity fundraiserN.  Pt has no complaints at the moment.  Will continue to monitor.

## 2015-06-28 NOTE — Progress Notes (Signed)
ANTICOAGULATION CONSULT NOTE - Follow Up Consult  Pharmacy Consult for heparin Indication: DVT   Labs:  Recent Labs  06/26/15 0430 06/27/15 0430 06/28/15 0200  HGB 11.3* 11.1* 11.1*  HCT 34.5* 33.8* 33.0*  PLT 512* 477* 426*  HEPARINUNFRC 0.41 0.19* 0.37    Assessment/Plan:  39yo male therapeutic on heparin after resumed post-op. Will continue gtt at current rate and confirm stable with additional level.   Vernard GamblesVeronda Clydell Sposito, PharmD, BCPS  06/28/2015,2:51 AM

## 2015-06-28 NOTE — Progress Notes (Signed)
Report given to [facility] RN on 6N.

## 2015-06-28 NOTE — Consult Note (Signed)
WOC ostomy follow up  Stoma type/location: RLQ ileostomy Stomal assessment/size: Current pouch has leaked and pt is ordered not to have Vac dressing changed at this time.  Discussed plan of care with trauma team.  Cut away soiled drape around Vac and seal left intact at 125mm cont suction without further visible soiling underneath the dressing. Stoma is basically a slit in the skin which drains stool; no budded stoma visible.  Opening flush with skin level, 1 inch. Peristomal assessment: intact  Outpu: Mod amt liquid brown stool, approx 50cc. Ostomy pouching: Barrier ring applied to maintain seal and one piece flexible pouch.  Pt watched procedure but did not participate or ask questions.  Supplies ordered to room for staff nurse use.WOC team will continue to follow for ostomy; trauma team following for abd wound. Cammie Mcgeeawn Dawnyel Leven MSN, RN, CWOCN, [place]awWCN-AP, CNS [phone]
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
## 2015-06-29 ENCOUNTER — Inpatient Hospital Stay (HOSPITAL_COMMUNITY): Payer: Self-pay | Admitting: Speech Pathology

## 2015-06-29 ENCOUNTER — Inpatient Hospital Stay (HOSPITAL_COMMUNITY): Payer: Self-pay | Admitting: Occupational Therapy

## 2015-06-29 LAB — BASIC METABOLIC PANEL
Anion gap: 10 (ref 5–15)
BUN: 14 mg/dL (ref 6–20)
CO2: 27 mmol/L (ref 22–32)
Calcium: 10 mg/dL (ref 8.9–10.3)
Chloride: 88 mmol/L — ABNORMAL LOW (ref 101–111)
Creatinine, Ser: 1 mg/dL (ref 0.61–1.24)
GFR calc Af Amer: >60 mL/min (ref >60)
GFR calc non Af Amer: >60 mL/min (ref >60)
Glucose, Bld: 159 mg/dL — ABNORMAL HIGH (ref 65–99)
Potassium: 3 mmol/L — ABNORMAL LOW (ref 3.5–5.1)
Sodium: 125 mmol/L — ABNORMAL LOW (ref 135–145)

## 2015-06-29 LAB — CBC
HCT: 32.9 % — ABNORMAL LOW (ref 39.0–52.0)
Hemoglobin: 10.7 g/dL — ABNORMAL LOW (ref 13.0–17.0)
MCH: 27.6 pg (ref 26.0–34.0)
MCHC: 32.5 g/dL (ref 30.0–36.0)
MCV: 84.8 fL (ref 78.0–100.0)
Platelets: 460 10*3/uL — ABNORMAL HIGH (ref 150–400)
RBC: 3.88 MIL/uL — ABNORMAL LOW (ref 4.22–5.81)
RDW: 17.1 % — ABNORMAL HIGH (ref 11.5–15.5)
WBC: 16.1 10*3/uL — ABNORMAL HIGH (ref 4.0–10.5)

## 2015-06-29 MED ORDER — QUETIAPINE FUMARATE 50 MG PO TABS
25.0000 mg | ORAL_TABLET | Freq: Every day | ORAL | Status: DC
  Administered 2015-06-29: 25 mg via ORAL
  Filled 2015-06-29: qty 1

## 2015-06-29 MED ORDER — METOCLOPRAMIDE HCL 5 MG PO TABS
5.0000 mg | ORAL_TABLET | Freq: Three times a day (TID) | ORAL | Status: DC
  Administered 2015-06-29 – 2015-06-30 (×3): 5 mg via ORAL
  Filled 2015-06-29 (×3): qty 1
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
## 2015-06-29 NOTE — Discharge Instructions (Signed)
Information on my medicine - Pradaxa (dabigatran)  This medication education was reviewed with me or my healthcare representative as part of my discharge preparation.  The pharmacist that spoke with me during my hospital stay was:  Rolley SimsMartin, Rykker Coviello Ann, [facility]  Why was Pradaxa prescribed for you? Pradaxa was prescribed to treat blood clots that may have been found in the veins of your legs (deep vein thrombosis) or in your lungs (pulmonary embolism) and to reduce the risk of them occurring again.  Pradaxa will take the place of the injectable anticoagulant medication you have been receiving for this condition for the last 5-10 days.  What do you Need to know about PradAXa? Take your Pradaxa TWICE DAILY - one capsule in the morning and one tablet in the evening with or without food.  It would be best to take the doses about the same time each day.  The capsules should not be broken, chewed or opened - they must be swallowed whole.  Do not store Pradaxa in other medication containers - once the bottle is opened the Pradaxa should be used within FOUR months; throw away any capsules that havent been by that time.  Take Pradaxa exactly as prescribed by your doctor.  DO NOT stop taking Pradaxa without talking to the doctor who prescribed the medication.  Refill your prescription before you run out.  After discharge, you should have regular check-up appointments with your healthcare provider that is prescribing your Pradaxa.  In the future your dose may need to be changed if your kidney function or weight changes by a significant amount.  What do you do if you miss a dose? If you miss a dose, take it as soon as you remember on the same day.  If your next dose is less than 6 hours away, skip the missed dose.  Do not take two doses of PRADAXA at the same time.  Important Safety Information A possible side effect of Pradaxa is bleeding. You should call your healthcare provider right away if you  experience any of the following: ? Bleeding from an injury or your nose that does not stop. ? Unusual colored urine (red or dark brown) or unusual colored stools (red or black). ? Unusual bruising for unknown reasons. ? A serious fall or if you hit your head (even if there is no bleeding).  Some medicines may interact with Pradaxa and might increase your risk of bleeding or clotting while on Pradaxa. To help avoid this, consult your healthcare provider or pharmacist prior to using any new prescription or non-prescription medications, including herbals, vitamins, non-steroidal anti-inflammatory drugs (NSAIDs) and supplements.  This website has more information on Pradaxa (dabigatran): [email or web address].

## 2015-06-29 NOTE — Progress Notes (Signed)
Trach team consult performed at this time. Pt currently on 28% ATC tolerating well. Pt has all emergency equipment in room. No education at this time.    Jacqulynn CadetHopper, Saket Hellstrom David RRT
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
## 2015-06-29 NOTE — Progress Notes (Signed)
Rehab admissions - Noted trend up in WBC count over the past couple days.  I met with patient.  I called patient's sister and talked about inpatient rehab and plans after potential rehab stay.  Plan is to go home with mom.  Mom to call me back later to confirm plans.  Currently no beds open on rehab.  Once patient is medically ready, will consider for inpatient rehab admission.  Call me for questions.  #[phone]

## 2015-06-29 NOTE — Progress Notes (Signed)
No place on flowsheet- RT cleaned IC, dried site (PT refuses drain sponge), all else WNL. Sp02 on 28% ATC 97%. All emergency equipment is in room.
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
## 2015-06-29 NOTE — Progress Notes (Signed)
[facility] site is WNL, IC WNL, no suction required. Sp02 on 28% ATC 100%. All emergency equipment is in room.

## 2015-06-30 ENCOUNTER — Inpatient Hospital Stay (HOSPITAL_COMMUNITY): Payer: Self-pay | Admitting: Occupational Therapy

## 2015-06-30 ENCOUNTER — Inpatient Hospital Stay (HOSPITAL_COMMUNITY): Payer: Self-pay | Admitting: Speech Pathology

## 2015-06-30 ENCOUNTER — Inpatient Hospital Stay (HOSPITAL_COMMUNITY): Payer: Self-pay | Admitting: Physical Therapy

## 2015-06-30 LAB — BASIC METABOLIC PANEL
Anion gap: 11 (ref 5–15)
BUN: 14 mg/dL (ref 6–20)
CO2: 25 mmol/L (ref 22–32)
Calcium: 10 mg/dL (ref 8.9–10.3)
Chloride: 88 mmol/L — ABNORMAL LOW (ref 101–111)
Creatinine, Ser: 1.07 mg/dL (ref 0.61–1.24)
GFR calc Af Amer: >60 mL/min (ref >60)
GFR calc non Af Amer: >60 mL/min (ref >60)
Glucose, Bld: 130 mg/dL — ABNORMAL HIGH (ref 65–99)
Potassium: 3.2 mmol/L — ABNORMAL LOW (ref 3.5–5.1)
Sodium: 124 mmol/L — ABNORMAL LOW (ref 135–145)

## 2015-06-30 LAB — CBC
HCT: 33.5 % — ABNORMAL LOW (ref 39.0–52.0)
Hemoglobin: 11.1 g/dL — ABNORMAL LOW (ref 13.0–17.0)
MCH: 28 pg (ref 26.0–34.0)
MCHC: 33.1 g/dL (ref 30.0–36.0)
MCV: 84.6 fL (ref 78.0–100.0)
Platelets: 471 10*3/uL — ABNORMAL HIGH (ref 150–400)
RBC: 3.96 MIL/uL — ABNORMAL LOW (ref 4.22–5.81)
RDW: 16.8 % — ABNORMAL HIGH (ref 11.5–15.5)
WBC: 17 10*3/uL — ABNORMAL HIGH (ref 4.0–10.5)

## 2015-06-30 MED ORDER — SODIUM CHLORIDE 1 G PO TABS
1.0000 g | ORAL_TABLET | Freq: Two times a day (BID) | ORAL | Status: DC
  Administered 2015-06-30 – 2015-07-03 (×7): 1 g via ORAL
  Filled 2015-06-30 (×9): qty 1

## 2015-06-30 MED ORDER — HYDROMORPHONE HCL 1 MG/ML IJ SOLN
0.5000 mg | INTRAMUSCULAR | Status: DC | PRN
  Administered 2015-06-30 – 2015-07-03 (×12): 0.5 mg via INTRAVENOUS
  Filled 2015-06-30 (×14): qty 1

## 2015-06-30 MED ORDER — POTASSIUM CHLORIDE CRYS ER 20 MEQ PO TBCR
40.0000 meq | EXTENDED_RELEASE_TABLET | Freq: Two times a day (BID) | ORAL | Status: AC
  Administered 2015-06-30 (×2): 40 meq via ORAL
  Filled 2015-06-30 (×3): qty 2
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
## 2015-06-30 NOTE — Progress Notes (Signed)
Rehab admissions - Patient discussed in trauma rounds yesterday afternoon.  Not medically ready this week.  Will follow up on Monday for medical readiness.  Call me for questions.  #[phone]

## 2015-07-01 LAB — CBC
HCT: 32.3 % — ABNORMAL LOW (ref 39.0–52.0)
Hemoglobin: 10.6 g/dL — ABNORMAL LOW (ref 13.0–17.0)
MCH: 27.9 pg (ref 26.0–34.0)
MCHC: 32.8 g/dL (ref 30.0–36.0)
MCV: 85 fL (ref 78.0–100.0)
Platelets: 450 10*3/uL — ABNORMAL HIGH (ref 150–400)
RBC: 3.8 MIL/uL — ABNORMAL LOW (ref 4.22–5.81)
RDW: 16.8 % — ABNORMAL HIGH (ref 11.5–15.5)
WBC: 16.3 10*3/uL — ABNORMAL HIGH (ref 4.0–10.5)

## 2015-07-01 LAB — BASIC METABOLIC PANEL
Anion gap: 11 (ref 5–15)
BUN: 14 mg/dL (ref 6–20)
CO2: 26 mmol/L (ref 22–32)
Calcium: 10.3 mg/dL (ref 8.9–10.3)
Chloride: 87 mmol/L — ABNORMAL LOW (ref 101–111)
Creatinine, Ser: 0.94 mg/dL (ref 0.61–1.24)
GFR calc Af Amer: >60 mL/min (ref >60)
GFR calc non Af Amer: >60 mL/min (ref >60)
Glucose, Bld: 120 mg/dL — ABNORMAL HIGH (ref 65–99)
Potassium: 3.9 mmol/L (ref 3.5–5.1)
Sodium: 124 mmol/L — ABNORMAL LOW (ref 135–145)

## 2015-07-01 MED ORDER — TRAMADOL HCL 50 MG PO TABS
100.0000 mg | ORAL_TABLET | Freq: Four times a day (QID) | ORAL | Status: DC
  Administered 2015-07-01 – 2015-07-03 (×9): 100 mg via ORAL
  Filled 2015-07-01 (×9): qty 2

## 2015-07-01 NOTE — Progress Notes (Signed)
0.5mg  iv dilaudid administered this morning

## 2015-07-01 NOTE — Progress Notes (Signed)
Capped trach at this time. Pt tolerating well, no distress noted, sats 100% via room air. RT will continue to monitor. Continious pulse oximeter being monitor, emergency airway equipment at bedside.

## 2015-07-01 NOTE — Plan of Care (Signed)
Problem: Phase I Progression Outcomes Goal: Pain controlled with appropriate interventions Outcome: Not Progressing Utilizing a lot of prn pain medications  Problem: Phase II Progression Outcomes Goal: Pain controlled Outcome: Not Met (add Reason) See comment above Goal: Surgical site without signs of infection Surgical site currently connected to negative pressure wound therapy. Will be discontinued tomorrow Goal: Return of bowel function (flatus, BM) IF ABDOMINAL SURGERY:  Pt has a right lower quadrant colostomy Goal: Foley discontinued Outcome: Progressing Pt voiding in urinal
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
## 2015-07-01 NOTE — Progress Notes (Signed)
Left thigh dressing has some old drainage, order in place not to change dressing

## 2015-07-01 NOTE — Progress Notes (Signed)
Pt has one staple to left inner thigh.MD said staple will be removed tomorrow when they come rounding

## 2015-07-02 LAB — CBC
HCT: 33.9 % — ABNORMAL LOW (ref 39.0–52.0)
Hemoglobin: 11.1 g/dL — ABNORMAL LOW (ref 13.0–17.0)
MCH: 27.9 pg (ref 26.0–34.0)
MCHC: 32.7 g/dL (ref 30.0–36.0)
MCV: 85.2 fL (ref 78.0–100.0)
Platelets: 483 10*3/uL — ABNORMAL HIGH (ref 150–400)
RBC: 3.98 MIL/uL — ABNORMAL LOW (ref 4.22–5.81)
RDW: 16.5 % — ABNORMAL HIGH (ref 11.5–15.5)
WBC: 17.6 10*3/uL — ABNORMAL HIGH (ref 4.0–10.5)

## 2015-07-02 LAB — BASIC METABOLIC PANEL
Anion gap: 9 (ref 5–15)
BUN: 12 mg/dL (ref 6–20)
CO2: 26 mmol/L (ref 22–32)
Calcium: 10.4 mg/dL — ABNORMAL HIGH (ref 8.9–10.3)
Chloride: 88 mmol/L — ABNORMAL LOW (ref 101–111)
Creatinine, Ser: 0.79 mg/dL (ref 0.61–1.24)
GFR calc Af Amer: >60 mL/min (ref >60)
GFR calc non Af Amer: >60 mL/min (ref >60)
Glucose, Bld: 112 mg/dL — ABNORMAL HIGH (ref 65–99)
Potassium: 4.1 mmol/L (ref 3.5–5.1)
Sodium: 123 mmol/L — ABNORMAL LOW (ref 135–145)

## 2015-07-02 NOTE — Progress Notes (Signed)
Addressed pt and his mom's questions ranging from wound care to pain medicine. Pt will be given a bed bath this pm
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
## 2015-07-02 NOTE — Progress Notes (Signed)
Patient ID: Raymond LeaverBrian Bowen, male   DOB: 03/29/1976, 39 y.o.   MRN: [id number] VAC removed - STSG with about 80% take - a little loose at lowest extent - I placed adaptic dressing. We will change in AM. Donor site gauze removed and xeroform left in place - mild green drainage at top - likely colonization, no cellulitis. Leave xeroform open to air. Staple removed L inner thigh. Violeta GelinasBurke Laymon Stockert, MD, MPH, FACS Trauma: [phone] General Surgery: [phone]

## 2015-07-02 NOTE — Progress Notes (Signed)
Prn ativan administered for anxiety as requested by pt's mom

## 2015-07-02 NOTE — Progress Notes (Signed)
0.5mg iv dilaudid administered for c/o pain 

## 2015-07-03 ENCOUNTER — Inpatient Hospital Stay (HOSPITAL_COMMUNITY)
Admission: AD | Admit: 2015-07-03 | Discharge: 2015-07-15 | DRG: 948 | Disposition: A | Discharge status: Home Health | Payer: Self-pay | Source: Intra-hospital | Attending: Physical Medicine & Rehabilitation | Admitting: Physical Medicine & Rehabilitation

## 2015-07-03 ENCOUNTER — Encounter (HOSPITAL_BASED_OUTPATIENT_CLINIC_OR_DEPARTMENT_OTHER): Payer: Self-pay | Admitting: *Deleted

## 2015-07-03 DIAGNOSIS — Z932 Ileostomy status: Secondary | ICD-10-CM

## 2015-07-03 DIAGNOSIS — G8918 Other acute postprocedural pain: Secondary | ICD-10-CM | POA: Insufficient documentation

## 2015-07-03 DIAGNOSIS — L8990 Pressure ulcer of unspecified site, unspecified stage: Secondary | ICD-10-CM | POA: Diagnosis present

## 2015-07-03 DIAGNOSIS — E871 Hypo-osmolality and hyponatremia: Secondary | ICD-10-CM | POA: Diagnosis not present

## 2015-07-03 DIAGNOSIS — W3400XA Accidental discharge from unspecified firearms or gun, initial encounter: Secondary | ICD-10-CM

## 2015-07-03 DIAGNOSIS — R5381 Other malaise: Principal | ICD-10-CM

## 2015-07-03 DIAGNOSIS — S36518D Primary blast injury of other part of colon, subsequent encounter: Secondary | ICD-10-CM

## 2015-07-03 DIAGNOSIS — D62 Acute posthemorrhagic anemia: Secondary | ICD-10-CM | POA: Diagnosis present

## 2015-07-03 DIAGNOSIS — T07XXXA Unspecified multiple injuries, initial encounter: Secondary | ICD-10-CM

## 2015-07-03 DIAGNOSIS — F419 Anxiety disorder, unspecified: Secondary | ICD-10-CM

## 2015-07-03 DIAGNOSIS — G4440 Drug-induced headache, not elsewhere classified, not intractable: Secondary | ICD-10-CM | POA: Diagnosis not present

## 2015-07-03 DIAGNOSIS — S36409A Unspecified injury of unspecified part of small intestine, initial encounter: Secondary | ICD-10-CM | POA: Diagnosis present

## 2015-07-03 DIAGNOSIS — S21239A Puncture wound without foreign body of unspecified back wall of thorax without penetration into thoracic cavity, initial encounter: Secondary | ICD-10-CM

## 2015-07-03 DIAGNOSIS — S36419D Primary blast injury of unspecified part of small intestine, subsequent encounter: Secondary | ICD-10-CM

## 2015-07-03 DIAGNOSIS — D72829 Elevated white blood cell count, unspecified: Secondary | ICD-10-CM | POA: Diagnosis present

## 2015-07-03 DIAGNOSIS — S35514D Injury of right iliac vein, subsequent encounter: Secondary | ICD-10-CM

## 2015-07-03 DIAGNOSIS — IMO0002 Reserved for concepts with insufficient information to code with codable children: Secondary | ICD-10-CM | POA: Diagnosis present

## 2015-07-03 DIAGNOSIS — I82401 Acute embolism and thrombosis of unspecified deep veins of right lower extremity: Secondary | ICD-10-CM

## 2015-07-03 DIAGNOSIS — F4321 Adjustment disorder with depressed mood: Secondary | ICD-10-CM | POA: Insufficient documentation

## 2015-07-03 DIAGNOSIS — R000 Tachycardia, unspecified: Secondary | ICD-10-CM

## 2015-07-03 DIAGNOSIS — I82509 Chronic embolism and thrombosis of unspecified deep veins of unspecified lower extremity: Secondary | ICD-10-CM | POA: Diagnosis present

## 2015-07-03 DIAGNOSIS — R42 Dizziness and giddiness: Secondary | ICD-10-CM

## 2015-07-03 DIAGNOSIS — T3995XA Adverse effect of unspecified nonopioid analgesic, antipyretic and antirheumatic, initial encounter: Secondary | ICD-10-CM

## 2015-07-03 DIAGNOSIS — D72828 Other elevated white blood cell count: Secondary | ICD-10-CM

## 2015-07-03 DIAGNOSIS — E876 Hypokalemia: Secondary | ICD-10-CM | POA: Diagnosis not present

## 2015-07-03 MED ORDER — GUAIFENESIN ER 600 MG PO TB12
1200.0000 mg | ORAL_TABLET | Freq: Two times a day (BID) | ORAL | Status: DC
  Administered 2015-07-03 – 2015-07-15 (×22): 1200 mg via ORAL
  Filled 2015-07-03 (×23): qty 2

## 2015-07-03 MED ORDER — ALUM & MAG HYDROXIDE-SIMETH 200-200-20 MG/5ML PO SUSP: 30.0000 mL | ORAL | Status: DC | PRN

## 2015-07-03 MED ORDER — ONDANSETRON HCL 4 MG/2ML IJ SOLN: 4.0000 mg | Freq: Four times a day (QID) | INTRAMUSCULAR | Status: DC | PRN

## 2015-07-03 MED ORDER — GUAIFENESIN ER 600 MG PO TB12
1200.0000 mg | ORAL_TABLET | Freq: Two times a day (BID) | ORAL | Status: DC
  Administered 2015-07-03: 1200 mg via ORAL
  Filled 2015-07-03: qty 2

## 2015-07-03 MED ORDER — HYDROCERIN EX CREA
TOPICAL_CREAM | Freq: Two times a day (BID) | CUTANEOUS | Status: DC
  Administered 2015-07-03: 20:00:00 via TOPICAL
  Administered 2015-07-04: 1 via TOPICAL
  Administered 2015-07-05 – 2015-07-13 (×13): via TOPICAL
  Administered 2015-07-14: 1 via TOPICAL
  Administered 2015-07-15: 08:00:00 via TOPICAL
  Filled 2015-07-03 (×2): qty 113

## 2015-07-03 MED ORDER — IPRATROPIUM-ALBUTEROL 0.5-2.5 (3) MG/3ML IN SOLN: 3.0000 mL | Freq: Four times a day (QID) | RESPIRATORY_TRACT | Status: DC | PRN

## 2015-07-03 MED ORDER — BACITRACIN ZINC 500 UNIT/GM EX OINT
TOPICAL_OINTMENT | Freq: Two times a day (BID) | CUTANEOUS | Status: DC
  Administered 2015-07-03: 20:00:00 via TOPICAL
  Administered 2015-07-04: 1 via TOPICAL
  Administered 2015-07-04 – 2015-07-08 (×6): via TOPICAL
  Administered 2015-07-09: 1 via TOPICAL
  Administered 2015-07-09 – 2015-07-13 (×6): via TOPICAL
  Filled 2015-07-03 (×4): qty 28.35

## 2015-07-03 MED ORDER — ACETAMINOPHEN 325 MG PO TABS
325.0000 mg | ORAL_TABLET | ORAL | Status: DC | PRN
  Administered 2015-07-05 – 2015-07-14 (×3): 650 mg via ORAL
  Filled 2015-07-03 (×4): qty 2

## 2015-07-03 MED ORDER — ONDANSETRON HCL 4 MG PO TABS
4.0000 mg | ORAL_TABLET | Freq: Four times a day (QID) | ORAL | Status: DC | PRN
  Administered 2015-07-03 – 2015-07-05 (×4): 4 mg via ORAL
  Filled 2015-07-03 (×5): qty 1

## 2015-07-03 MED ORDER — TRAZODONE HCL 50 MG PO TABS
25.0000 mg | ORAL_TABLET | Freq: Every evening | ORAL | Status: DC | PRN
  Administered 2015-07-03: 50 mg via ORAL
  Filled 2015-07-03: qty 1

## 2015-07-03 MED ORDER — TRAMADOL HCL 50 MG PO TABS
100.0000 mg | ORAL_TABLET | Freq: Four times a day (QID) | ORAL | Status: DC
  Administered 2015-07-03 – 2015-07-15 (×40): 100 mg via ORAL
  Filled 2015-07-03 (×42): qty 2

## 2015-07-03 MED ORDER — DABIGATRAN ETEXILATE MESYLATE 150 MG PO CAPS
150.0000 mg | ORAL_CAPSULE | Freq: Two times a day (BID) | ORAL | Status: DC
  Administered 2015-07-03 – 2015-07-15 (×23): 150 mg via ORAL
  Filled 2015-07-03 (×27): qty 1

## 2015-07-03 MED ORDER — LORAZEPAM 1 MG PO TABS
1.0000 mg | ORAL_TABLET | Freq: Four times a day (QID) | ORAL | Status: DC | PRN
  Administered 2015-07-04 – 2015-07-15 (×23): 1 mg via ORAL
  Filled 2015-07-03 (×23): qty 1

## 2015-07-03 MED ORDER — GUAIFENESIN-DM 100-10 MG/5ML PO SYRP: 5.0000 mL | ORAL_SOLUTION | Freq: Four times a day (QID) | ORAL | Status: DC | PRN

## 2015-07-03 MED ORDER — SODIUM CHLORIDE 0.9 % IJ SOLN
10.0000 mL | INTRAMUSCULAR | Status: DC | PRN
  Administered 2015-07-03: 10 mL
  Filled 2015-07-03: qty 40

## 2015-07-03 MED ORDER — POLYETHYLENE GLYCOL 3350 17 G PO PACK: 17.0000 g | PACK | Freq: Every day | ORAL | Status: DC | PRN

## 2015-07-03 MED ORDER — OXYCODONE HCL 5 MG PO TABS
10.0000 mg | ORAL_TABLET | ORAL | Status: DC | PRN
  Administered 2015-07-03 – 2015-07-04 (×4): 20 mg via ORAL
  Administered 2015-07-04: 10 mg via ORAL
  Administered 2015-07-05 (×3): 20 mg via ORAL
  Administered 2015-07-05: 10 mg via ORAL
  Administered 2015-07-05 – 2015-07-12 (×28): 20 mg via ORAL
  Filled 2015-07-03 (×29): qty 4
  Filled 2015-07-03: qty 2
  Filled 2015-07-03 (×5): qty 4
  Filled 2015-07-03: qty 2
  Filled 2015-07-03 (×4): qty 4

## 2015-07-03 MED ORDER — BOOST / RESOURCE BREEZE PO LIQD
1.0000 | Freq: Three times a day (TID) | ORAL | Status: DC
  Administered 2015-07-03 – 2015-07-14 (×23): 1 via ORAL

## 2015-07-03 MED ORDER — FENTANYL 25 MCG/HR TD PT72
100.0000 ug | MEDICATED_PATCH | TRANSDERMAL | Status: DC
  Administered 2015-07-05: 100 ug via TRANSDERMAL
  Filled 2015-07-03: qty 4

## 2015-07-03 MED ORDER — SODIUM CHLORIDE 1 G PO TABS
1.0000 g | ORAL_TABLET | Freq: Two times a day (BID) | ORAL | Status: DC
  Administered 2015-07-04 – 2015-07-12 (×14): 1 g via ORAL
  Filled 2015-07-03 (×26): qty 1

## 2015-07-03 MED ORDER — METHOCARBAMOL 500 MG PO TABS
500.0000 mg | ORAL_TABLET | Freq: Four times a day (QID) | ORAL | Status: DC | PRN
  Administered 2015-07-04 – 2015-07-14 (×7): 500 mg via ORAL
  Filled 2015-07-03 (×12): qty 1
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
## 2015-07-03 NOTE — Progress Notes (Signed)
Stoma dressing changed to vaseline dressing per Earney HamburgMichael Jeffrey, PA.

## 2015-07-03 NOTE — Progress Notes (Signed)
Rehab admissions - I spoke with trauma PA, Casimiro NeedleMichael and with case Production designer, theatre/television/filmmanager, Raynelle FanningJulie.  Not sure patient is medically ready yet for rehab.  Did receive a call from OT, Brynn, that patient did well with therapy today.  Need to determine if any further interventions are necessary for abdominal wound/STSG areas.  Call me for questions.  #[phone]

## 2015-07-03 NOTE — Progress Notes (Signed)
Patient ID: Alvis LemmingsBrian K Langford, male   DOB: 30-Jul-1975, 39 y.o.   MRN: [id number] Patient arrived from [facility] with RN and belongings. Patient oriented to room, rehab process, fall prevention plan, rehab safety plan, rehab schedule, health resource notebook, and nurse call system with verbal understanding. Patient resting comfortably in bed with no c/o pain at this time. Patient declined Flu vaccination. Will continue to monitor patient for changes.
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
## 2015-07-03 NOTE — Consult Note (Signed)
WOC ostomy follow up Stoma type/location: RLQ, slit in the skin, no observable stoma Stomal assessment/size: no stoma visable Output liquid brown, strong odor Ostomy pouching: 1pc.pouch changed over the weekend, intact doing well Education provided: Patient is learning to empty, I have encouraged him to empty each time instead of letting staff do this.  [place] Dis[place]: Yes Patient is a bit anxious at the time of my visit, he has recently had his trach decannulated  and he is worried about his breathing.  I will check with him tomorrow and see if we can change his pouch together.   WOC nurse will follow along with you for support with ostomy care.  Lou Loewe [place] RN, [place] [phone]
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
## 2015-07-03 NOTE — Care Management Note (Signed)
Case Management Note  Patient Details  Name: Valla LeaverBrian Ma MRN: [id number] Date of Birth: [birth year]  Subjective/Objective:  Pt medically stable for dc today.                  Action/Plan: Plan dc to CIR today.    Expected Discharge Date:  07/03/2015           Expected Discharge Plan:  IP Rehab Facility  In-House Referral:  Clinical Social Work  Discharge planning Services  CM Consult  Post Acute Care Choice:    Choice offered to:     DME Arranged:    DME Agency:     HH Arranged:    HH Agency:     Status of Service:  Completed, signed off  Medicare Important Message Given:    Date Medicare IM Given:    Medicare IM give by:    Date Additional Medicare IM Given:    Additional Medicare Important Message give by:     If discussed at Long Length of Stay Meetings, dates discussed:    Additional Comments:  Quintella BatonJulie W. Ottavio Norem, RN, BSN  Trauma/Neuro ICU Case Manager [phone]

## 2015-07-03 NOTE — Progress Notes (Signed)
Pt decannulated per order without and complications.  Pt has high anxiety about air coming out of his stoma and not talking as normal.  I reassured him that this is normal until his stoma heals and closes.  Pt called RN for anxiety medicine.  VS WNL, no distressed noted, BIL BS clear and equal.  Decannulation went well.

## 2015-07-04 ENCOUNTER — Inpatient Hospital Stay (HOSPITAL_COMMUNITY): Payer: MEDICAID | Admitting: Occupational Therapy

## 2015-07-04 ENCOUNTER — Inpatient Hospital Stay (HOSPITAL_COMMUNITY): Payer: MEDICAID | Admitting: Physical Therapy

## 2015-07-04 ENCOUNTER — Inpatient Hospital Stay (HOSPITAL_COMMUNITY): Payer: MEDICAID | Admitting: Speech Pathology

## 2015-07-04 ENCOUNTER — Inpatient Hospital Stay (HOSPITAL_COMMUNITY): Payer: Self-pay | Admitting: Physical Therapy

## 2015-07-04 DIAGNOSIS — E871 Hypo-osmolality and hyponatremia: Secondary | ICD-10-CM

## 2015-07-04 DIAGNOSIS — S36409S Unspecified injury of unspecified part of small intestine, sequela: Secondary | ICD-10-CM

## 2015-07-04 DIAGNOSIS — R5381 Other malaise: Principal | ICD-10-CM

## 2015-07-04 DIAGNOSIS — W3400XS Accidental discharge from unspecified firearms or gun, sequela: Secondary | ICD-10-CM

## 2015-07-04 DIAGNOSIS — S21209S Unspecified open wound of unspecified back wall of thorax without penetration into thoracic cavity, sequela: Secondary | ICD-10-CM

## 2015-07-04 DIAGNOSIS — K651 Peritoneal abscess: Secondary | ICD-10-CM

## 2015-07-04 LAB — COMPREHENSIVE METABOLIC PANEL
ALT: 32 U/L (ref 17–63)
AST: 23 U/L (ref 15–41)
Albumin: 2.7 g/dL — ABNORMAL LOW (ref 3.5–5.0)
Alkaline Phosphatase: 244 U/L — ABNORMAL HIGH (ref 38–126)
Anion gap: 9 (ref 5–15)
BUN: 9 mg/dL (ref 6–20)
CO2: 24 mmol/L (ref 22–32)
Calcium: 10.4 mg/dL — ABNORMAL HIGH (ref 8.9–10.3)
Chloride: 91 mmol/L — ABNORMAL LOW (ref 101–111)
Creatinine, Ser: 0.77 mg/dL (ref 0.61–1.24)
GFR calc Af Amer: >60 mL/min (ref >60)
GFR calc non Af Amer: >60 mL/min (ref >60)
Glucose, Bld: 106 mg/dL — ABNORMAL HIGH (ref 65–99)
Potassium: 4.1 mmol/L (ref 3.5–5.1)
Sodium: 124 mmol/L — ABNORMAL LOW (ref 135–145)
Total Bilirubin: 0.9 mg/dL (ref 0.3–1.2)
Total Protein: 7.8 g/dL (ref 6.5–8.1)

## 2015-07-04 LAB — CBC WITH DIFFERENTIAL/PLATELET
Basophils Absolute: 0.1 10*3/uL (ref 0.0–0.1)
Basophils Relative: 0 %
Eosinophils Absolute: 0.7 10*3/uL (ref 0.0–0.7)
Eosinophils Relative: 4 %
HCT: 33.2 % — ABNORMAL LOW (ref 39.0–52.0)
Hemoglobin: 10.6 g/dL — ABNORMAL LOW (ref 13.0–17.0)
Lymphocytes Relative: 24 %
Lymphs Abs: 4.2 10*3/uL — ABNORMAL HIGH (ref 0.7–4.0)
MCH: 27.4 pg (ref 26.0–34.0)
MCHC: 31.9 g/dL (ref 30.0–36.0)
MCV: 85.8 fL (ref 78.0–100.0)
Monocytes Absolute: 2.2 10*3/uL — ABNORMAL HIGH (ref 0.1–1.0)
Monocytes Relative: 12 %
Neutro Abs: 10.8 10*3/uL — ABNORMAL HIGH (ref 1.7–7.7)
Neutrophils Relative %: 60 %
Platelets: 457 10*3/uL — ABNORMAL HIGH (ref 150–400)
RBC: 3.87 MIL/uL — ABNORMAL LOW (ref 4.22–5.81)
RDW: 16.6 % — ABNORMAL HIGH (ref 11.5–15.5)
WBC: 17.9 10*3/uL — ABNORMAL HIGH (ref 4.0–10.5)

## 2015-07-04 LAB — CORTISOL-AM, BLOOD: Cortisol - AM: 8.1 ug/dL (ref 6.7–22.6)

## 2015-07-04 MED ORDER — SODIUM CHLORIDE 0.9 % IJ SOLN
10.0000 mL | INTRAMUSCULAR | Status: DC | PRN
  Administered 2015-07-05 (×4): 10 mL
  Administered 2015-07-06: 30 mL
  Administered 2015-07-07 – 2015-07-10 (×7): 10 mL
  Administered 2015-07-11: 30 mL
  Filled 2015-07-04 (×13): qty 40

## 2015-07-04 MED ORDER — GABAPENTIN 100 MG PO CAPS
100.0000 mg | ORAL_CAPSULE | Freq: Three times a day (TID) | ORAL | Status: DC
  Administered 2015-07-04 – 2015-07-10 (×18): 100 mg via ORAL
  Filled 2015-07-04 (×19): qty 1

## 2015-07-04 MED ORDER — TRAZODONE HCL 50 MG PO TABS
50.0000 mg | ORAL_TABLET | Freq: Every day | ORAL | Status: DC
  Administered 2015-07-04 – 2015-07-14 (×10): 50 mg via ORAL
  Filled 2015-07-04 (×12): qty 1
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
## 2015-07-04 NOTE — Progress Notes (Signed)
Changed dressing with new layer of adaptic. Less discharge noted.     Freeman CaldronMichael J. Treyvion Durkee, PA-C Pager: [phone] General Trauma PA Pager: [phone]
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
## 2015-07-04 NOTE — Progress Notes (Signed)
Patient with continuous pulse ox monitor on throughout night, o2 sat staying 96-100%.  Will continue to monitor and pass to day shift.
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
## 2015-07-04 NOTE — Progress Notes (Signed)
Patient information reviewed and entered into eRehab system by Caralina Nop, RN, CRRN, PPS Coordinator.  Information including medical coding and functional independence measure will be reviewed and updated through discharge.    
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
## 2015-07-05 ENCOUNTER — Inpatient Hospital Stay (HOSPITAL_COMMUNITY): Payer: MEDICAID | Admitting: Physical Therapy

## 2015-07-05 ENCOUNTER — Inpatient Hospital Stay (HOSPITAL_COMMUNITY): Payer: MEDICAID | Admitting: Speech Pathology

## 2015-07-05 ENCOUNTER — Inpatient Hospital Stay (HOSPITAL_COMMUNITY): Payer: Self-pay | Admitting: Physical Therapy

## 2015-07-05 ENCOUNTER — Inpatient Hospital Stay (HOSPITAL_COMMUNITY): Payer: Self-pay | Admitting: Occupational Therapy

## 2015-07-05 DIAGNOSIS — D62 Acute posthemorrhagic anemia: Secondary | ICD-10-CM

## 2015-07-05 LAB — URINALYSIS, ROUTINE W REFLEX MICROSCOPIC
Bilirubin Urine: NEGATIVE
Glucose, UA: NEGATIVE mg/dL
Ketones, ur: NEGATIVE mg/dL
Nitrite: NEGATIVE
Protein, ur: 30 mg/dL — AB
Specific Gravity, Urine: 1.014 (ref 1.005–1.030)
pH: 6 (ref 5.0–8.0)

## 2015-07-05 LAB — URINE MICROSCOPIC-ADD ON

## 2015-07-05 MED ORDER — CEPHALEXIN 250 MG PO CAPS
250.0000 mg | ORAL_CAPSULE | Freq: Three times a day (TID) | ORAL | Status: DC
  Administered 2015-07-06 – 2015-07-07 (×4): 250 mg via ORAL
  Filled 2015-07-05 (×4): qty 1

## 2015-07-05 MED ORDER — ONDANSETRON HCL 4 MG PO TABS
4.0000 mg | ORAL_TABLET | Freq: Four times a day (QID) | ORAL | Status: DC
  Administered 2015-07-06 – 2015-07-15 (×37): 4 mg via ORAL
  Filled 2015-07-05 (×38): qty 1

## 2015-07-05 MED ORDER — SIMETHICONE 40 MG/0.6ML PO SUSP
40.0000 mg | Freq: Four times a day (QID) | ORAL | Status: DC
  Administered 2015-07-05 – 2015-07-15 (×38): 40 mg via ORAL
  Filled 2015-07-05 (×46): qty 0.6
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
## 2015-07-05 NOTE — Progress Notes (Signed)
Speech Language Pathology Discharge Summary  Patient Details  Name: Raymond Bowen MRN: [id number] Date of Birth: Jun 10, 1976  Today's Date: 07/05/2015 SLP Individual Time: 2248-2500 SLP Individual Time Calculation (min): 45 min and Today's Date: 07/05/2015 SLP Missed Time: 15 Minutes Missed Time Reason: Patient ill (Comment)   Skilled Therapeutic Interventions:  Skilled treatment session focused on dysphagia goals. SLP facilitated session by providing skilled observation with breakfast meal of regular textures with thin liquids. Patient consumed meal without overt s/s of aspiration and was Mod I for use of swallowing compensatory strategies. Patient demonstrates minimal PO intake due to feeling "full" fast. 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 3 larger meals. Patient verbalized understanding of all information. Patient consumed his medications whole in puree without difficulty. However, after medications given, patient reported nausea with minimal expectoration of saliva. RN made aware. Patient missed remaining 15 minutes of session due to declining further PO intake due to nausea. Patient left supine in bed with all needs within reach.   Patient has met 1 of 1 long term goals.  Patient to discharge at overall Modified Independent level.   Reasons goals not met: N/A    Clinical Impression/Discharge Summary: Patient has made functional gains and has met 1 of 1 LTG's this admission. Currently, patient is consuming regular textures with thin liquids via straw without overt s/s of aspiration and is Mod I for use of swallowing compensatory strategies. Patient educated on current swallowing function, compensatory strategies and appropriate diet textures to maximize digestion and verbalized understanding of all information. Patient will be discharged from skilled SLP intervention due to all  swallowing goals met at this time.    Recommendation:  None      Equipment: N/A   Reasons for discharge: Treatment goals met   Patient/Family Agrees with Progress Made and Goals Achieved: Yes   Function:  Eating Eating   Modified Consistency Diet: No Eating Assist Level: No help, No cues           Cognition Comprehension Comprehension assist level: Understands complex 90% of the time/cues 10% of the time  Expression   Expression assist level: Expresses complex 90% of the time/cues < 10% of the time  Social Interaction Social Interaction assist level: Interacts appropriately 90% of the time - Needs monitoring or encouragement for participation or interaction.  Problem Solving Problem solving assist level: Solves basic 90% of the time/requires cueing < 10% of the time  Memory Memory assist level: Recognizes or recalls 90% of the time/requires cueing < 10% of the time   [name] 07/05/2015, 4:38 PM
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
## 2015-07-05 NOTE — Progress Notes (Signed)
PAm Love PAC made aware of UA results; orders received.

## 2015-07-05 NOTE — Progress Notes (Signed)
Dr. Lindie SpruceWyatt and I did his dressing change today, it was well tolerated.  Replaced adaptic with 4x4 WD packing with ABD and tape.  Wound a bit soupy at inferior aspect.  Skin graft with 60% take.  Good granulation tissue formation.  Doing well with rehab.  Continue dressing changes as prescribed three times a day.   Jorje GuildMegan Harlowe Dowler, PA-C [facility] Surgery [facility] [phone]

## 2015-07-05 NOTE — Progress Notes (Signed)
Physical Therapy Session Note  Patient Details  Name: Raymond Bowen MRN: [id number] Date of Birth: 07/14/1976  Today's Date: 07/05/2015 PT Individual Time: 1116-1200 PT Individual Time Calculation (min): 44 min  Session 2: Missed time 1430-1500 (30 min) due to nausea.    Short Term Goals: Week 1:  PT Short Term Goal 1 (Week 1): Pt will transfer with LRAD and supervision PT Short Term Goal 2 (Week 1): Pt will tolerate at least 3 hours OOB daily to improve activity tolerance PT Short Term Goal 3 (Week 1): Pt will amb at least 50' with LRAD and min assist PT Short Term Goal 4 (Week 1): Pt will demonstrate dynamic standing balance with min assist   Skilled Therapeutic Interventions/Progress Updates:    Session 1: Pt received resting in recliner, reporting nausea and vomiting throughout the day but agreeable to bedside therapy.  Session focus on strengthening, activity tolerance, and patient education and discharge planning.  PT instructed patient in BLE therex x10 reps with 3# ankle weight on RLE: heel/toe raises, LAQ, hip flexion, hamstring curls with level 2 theraband.  PT instructed pt in BLE therex with no added resistance in reclined position: hip abd/add to midline, heel slides, SLR, and quad sets.  PT reports increased level of intolerable pain with glute sets after 3 reps.  PT provided patient education on increasing activity tolerance, NMR for R foot, possibility of bracing for foot clearance during gait, and d/c planning. Pt requesting to return to bed at end of session and performed stand/pivot with min assist and 2 trials to come to standing.  Pt positioned in supine and left with call bell in reach and needs met.   Session 2: Pt received resting in bed, having changed back into hospital gown reporting ongoing nausea and discomfort in abdomen.  Pt reports even taking a sip of water causes him to feel as if he will vomit.  Declined further therapy this afternoon. RN aware.   Therapy  Documentation Precautions:  Precautions Precautions: Fall Precaution Comments: R foot pain, weakness Restrictions Weight Bearing Restrictions: No Vital Signs: Therapy Vitals BP: 120/76 Pulse Rate: 106 Patient Position (if appropriate): Sitting Oxygen Therapy SpO2: 100 % O2 Device: Not Delivered Pain: Pain Assessment Pain Assessment: 0-10 Pain Score: 10-Worst pain ever Pain Intervention(s): RN made aware;Repositioned;Emotional support   See Function Navigator for Current Functional Status.   Therapy/Group: Individual Therapy  Vana Arif E Penven-Crew 07/05/2015, 12:30 PM

## 2015-07-05 NOTE — Progress Notes (Signed)
Occupational Therapy Session Note  Patient Details  Name: Raymond Bowen MRN: [id number] Date of Birth: 19-Feb-1976  Today's Date: 07/05/2015 OT Individual Time: 7829-56210915-1015 OT Individual Time Calculation (min): 60 min    Short Term Goals: Week 1:  OT Short Term Goal 1 (Week 1): NA secondary to ELOS goals overall supervision to modified independent level.    Skilled Therapeutic Interventions/Progress Updates:    1:1 self care retraining at sink level. Focus on sit to stand, standing balance with equal weight bearing through LEs,  management of right LE in proper place before sit to stands, activity tolerance, etc. Pt able to dress with assistance for LB due to difficulty flexing forward due to pain in abdomen. Demonstrated and educated pt on how to empty ostomy and care for bag. Pt ambulated from sink to recliner with PRAFO donned. Pt reported the "boot is very heavy," however he is better able to weight bear through the LE. At times pt did require assistance to bring his right LE into abduction for better placement during ambulation (his LE wants to adduct.).   Therapy Documentation Precautions:  Precautions Precautions: Fall Precaution Comments: R foot pain, weakness Restrictions Weight Bearing Restrictions: No Pain: Pain Assessment Pain Assessment: 0-10 Pain Score: 10-Worst pain ever Pain Intervention(s): RN made aware;Repositioned;Emotional supportc/o abdominal pain at end of session after surgeon changed bandage  See Function Navigator for Current Functional Status.   Therapy/Group: Individual Therapy  Roney MansSmith, Auria Mckinlay [facility] 07/05/2015, 2:40 PM
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
## 2015-07-05 NOTE — Plan of Care (Signed)
Problem: Food- and Nutrition-Related Knowledge Deficit (NB-1.1) Goal: Nutrition education Formal process to instruct or train a patient/client in a skill or to impart knowledge to help patients/clients voluntarily manage or modify food choices and eating behavior to maintain or improve health. Outcome: Completed/Met Date Met:  07/05/15 RD consulted for diet education.   Pt was given handout "Ileostomy Nutrition Therapy" from the Academy of Nutrition and Dietetics Manual. Pt was given a list of foods recommended and not recommended with a ileostomy. Discussed the importance of a low residue/low fiber diet in relations with his ileostomy. Reviewed list of foods that are high and low in fiber.  Recommended 5 small meals a day and adequate protein intake. Encourage a food diary. RD contact information was given.   Expect good compliance.   Corrin Parker, MS, RD, LDN Pager # [phone] After hours/ weekend pager # [phone]

## 2015-07-06 ENCOUNTER — Inpatient Hospital Stay (HOSPITAL_COMMUNITY): Payer: MEDICAID | Admitting: Physical Therapy

## 2015-07-06 ENCOUNTER — Encounter (HOSPITAL_COMMUNITY): Payer: Self-pay

## 2015-07-06 ENCOUNTER — Inpatient Hospital Stay (HOSPITAL_COMMUNITY): Payer: Self-pay | Admitting: Occupational Therapy

## 2015-07-06 DIAGNOSIS — F4321 Adjustment disorder with depressed mood: Secondary | ICD-10-CM

## 2015-07-06 DIAGNOSIS — S7401XS Injury of sciatic nerve at hip and thigh level, right leg, sequela: Secondary | ICD-10-CM

## 2015-07-06 LAB — BASIC METABOLIC PANEL
Anion gap: 9 (ref 5–15)
BUN: 11 mg/dL (ref 6–20)
CO2: 24 mmol/L (ref 22–32)
Calcium: 10.8 mg/dL — ABNORMAL HIGH (ref 8.9–10.3)
Chloride: 90 mmol/L — ABNORMAL LOW (ref 101–111)
Creatinine, Ser: 0.97 mg/dL (ref 0.61–1.24)
GFR calc Af Amer: >60 mL/min (ref >60)
GFR calc non Af Amer: >60 mL/min (ref >60)
Glucose, Bld: 111 mg/dL — ABNORMAL HIGH (ref 65–99)
Potassium: 4.4 mmol/L (ref 3.5–5.1)
Sodium: 123 mmol/L — ABNORMAL LOW (ref 135–145)

## 2015-07-06 MED ORDER — FENTANYL 25 MCG/HR TD PT72
75.0000 ug | MEDICATED_PATCH | TRANSDERMAL | Status: DC
  Administered 2015-07-08 – 2015-07-11 (×2): 75 ug via TRANSDERMAL
  Filled 2015-07-06 (×2): qty 3

## 2015-07-06 NOTE — Progress Notes (Signed)
Patient's mother called 2130 for update on patient.  Update given, questions answered.

## 2015-07-06 NOTE — Progress Notes (Signed)
Patient with emesis episode x1 directly after HS meds given 2200, including PRN 20mg  Oxy. 3 crackers eaten prior to meds, meds taken with applesauce. Pt states "It's from that sodium pill, I don't want to take it any more! It always makes me do this."  Four oxy tablets counted in emesis, wasted in pyxis and another dose given 2230 to patient per request, stating pain 10/10 in right foot.  Patient sleeping comfortably, will continue to monitor remainder of shift.

## 2015-07-06 NOTE — Progress Notes (Signed)
Physical Therapy Session Note  Patient Details  Name: Raymond Bowen MRN: [id number] Date of Birth: 08-19-1975  Today's Date: 07/06/2015 PT Individual Time: 1097-8022 and 1530-1630 PT Individual Time Calculation (min): 87 min and 60 min   Short Term Goals: Week 1:  PT Short Term Goal 1 (Week 1): Pt will transfer with LRAD and supervision PT Short Term Goal 2 (Week 1): Pt will tolerate at least 3 hours OOB daily to improve activity tolerance PT Short Term Goal 3 (Week 1): Pt will amb at least 50' with LRAD and min assist PT Short Term Goal 4 (Week 1): Pt will demonstrate dynamic standing balance with min assist   Skilled Therapeutic Interventions/Progress Updates:    Session 1: Pt received supine in bed, continues to report ongoing nausea/vomitting and 10/10 pain in abdomen and R foot.  Pt agreeable to therapy session with encouragement from PT.  Session focus on ADL instruction, strengthening, activity tolerance, transfers, ambulation, posture, and patient education.  Supine>sit EOB mod I using elevated HOB and bedrails.  PT assist with LB dressing threading RLE through pant leg, patient able to thread L foot and pull up with sit<>stand.  PT instructed patient in emptying of ostomy bag with patient able to recall 50% of instructions from OT session yesterday.  Pt able to perform UB dressing indep.  Pt performed stand pivot from bed>w/c with RW and steady assist with verbal cues for positioning of RLE and upright posture.  Pt performed 10 min on UEB at random setting, level 5 with no rest breaks, pt reporting dizziness at completion of exercise.  Vitals assessed: HR 113, BP 117/86, O2 100%.  Pt performed squat/pivot from w/c>therapy mat with close supervision and verbal cues for set up and sequencing.  PT instructed patient in BLE therex x15 reps LAQ, hip flexion, and trunk extension.  PT instructed patient in static standing balance with pipe tree activity x5 minutes with initial LUE support progress  to no UE support.  Pt continues to demo decreased weight bearing tolerance through RLE in standing and often positions this LE in front to off load.  Pt returned to w/c squat/pivot at end of session and propelled w/c back to room mod I.  Pt requesting to use urinal in w/c and PT instructed patient in lateral leans to doff pants, pt unsuccessful in urination.  Pt able to perform lateral leans to pull up pants and sit<>stand with RW and amb to recliner 10' with steady assist.  PT positioned patient in recliner and he requested to use urinal again; PT again instructed patient in lateral leans/rolling to doff pants and pt able to doff/don pants with supervision.  Again pt unsuccessful in urination.  Pt left in recliner with call bell in reach and needs met.   Session 2:  Pt received resting in recliner, reports ongoing nausea and 10/10 pain in abdomen and foot but agreeable to therapy session.  Pt reports he was able to sit up from the end of previous session at 1100 until this therapist returned with little discomfort.  PT instructed patient in amb x40' with RW and steady assist with verbal cues for upright posture, gait pattern, walker positioning, and weightbearing through RLE.  Pt sat in w/c with supervision and propelled w/c remaining distance to therapy gym.  PT instructed patient in negotiation of 8 steps with 2 handrails and steady assist with verbal cues for sequencing and encouragement.  Pt requesting vitals be assessed following stairs: HR 120>116, BP 130/86,  O2 100%.  PT provided patient education on rise in HR and SBP likely due to pain and exercise.  Pt propelled w/c >300' over thresholds, through doorways, around furniture, and negotiated 3% grade with no rest breaks.  Pt returned to room at end of session and requesting to continue sitting in recliner.  Sit<>stand with supervision and amb transfer to w/c.  Pt with posterior LOB backing to recliner, requiring mod assist to correct.  Pt left resting in  recliner with call bell in reach and needs met.   Therapy Documentation Precautions:  Precautions Precautions: Fall Precaution Comments: R foot pain, weakness Restrictions Weight Bearing Restrictions: No Pain: Pain Assessment Pain Assessment: 0-10 Pain Score: 10-Worst pain ever Pain Location: Foot Pain Orientation: Right Pain Intervention(s): Emotional support;RN made aware Mobility:   Locomotion :    Trunk/Postural Assessment :    Balance:   Exercises:   Other Treatments:     See Function Navigator for Current Functional Status.   Therapy/Group: Individual Therapy  Earnest Conroy Penven-Crew 07/06/2015, 12:14 PM
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
## 2015-07-06 NOTE — Progress Notes (Signed)
Occupational Therapy Session Note  Patient Details  Name: Raymond Bowen MRN: [id number] Date of Birth: [birth year]  Today's Date: 07/06/2015 OT Individual Time: 1400-1456 OT Individual Time Calculation (min): 56 min    Short Term Goals: Week 1:  OT Short Term Goal 1 (Week 1): NA secondary to ELOS goals overall supervision to modified independent level.    Skilled Therapeutic Interventions/Progress Updates:    Pt seen for OT therapy session focusing on functional standing balance and endurance. Pt in recliner upon arrival, voicing some stomach discomfort, however, agreeable to tx session. Pt ambulated ~6 ft with RW and min A to w/c. He self propelled w/c to therapy gym. In gym, pt completed Wii bowling activity with emphasis on weightbearing through R LE, dynamic standing balance and endurance. Pt tolerated ~5 minutes of standing before requesting seated rest break. Completed x4 trials with pt requiring CGA-min A for steadying. Verbal and tactile cues and demonstration provided for w/c parts management.  Pt requested return to room at end of session, voicing 10/10 pain in R LE. RN present and administered medications. Pt left in recliner at end of session, all needs in reach.   Therapy Documentation Precautions:  Precautions Precautions: Fall Precaution Comments: R foot pain, weakness Restrictions Weight Bearing Restrictions: No Pain: Pain Assessment Pain Assessment: 0-10 Pain Score: 10-Worst pain ever Pain Type: Acute pain;Neuropathic pain Pain Location: Foot Pain Orientation: Right Pain Descriptors / Indicators: Aching;Tingling;Numbness Pain Frequency: Constant Pain Onset: On-going Patients Stated Pain Goal: 3 Pain Intervention(s): Medication (See eMAR);Repositioned, RN aware, rest  See Function Navigator for Current Functional Status.   Therapy/Group: Individual Therapy  Lewis, Mikaelyn Arthurs C 07/06/2015, 7:16 AM

## 2015-07-06 NOTE — Progress Notes (Signed)
Social Work Patient ID: Raymond Bowen, male   DOB: 16-Oct-1975, 39 y.o.   MRN: [id number]   Have reviewed team conference with pt.  He is aware and agreeable with targeted d/c date of 12/24 and mod i goals.  I have left a message with pt's mother including team conf and requesting that she provide me with phone # for pt's sister, Chrystal, as pt to d/c to sister's home (in [place], [place]).  Have [facility] contacting [facility] agencies who cover that area but have not yet been able to secure one who will see pt with Medicaid pending - will continue to work on this.  Please note that pt's current diagnoses do not qualify pt for any therapies at home under Medicaid (pending).  Have also requested from [facility] that we get a Medicaid AND SSD application started prior to pt's d/c and pt is agreeable with this.  Continue to follow.  Dawson Hollman, LCSW

## 2015-07-06 NOTE — Care Management Note (Signed)
Inpatient Rehabilitation Center Individual Statement of Services  Patient Name:  Raymond LemmingsBrian K Frenz  Date:  07/06/2015  Welcome to the Inpatient Rehabilitation Center.  Our goal is to provide you with an individualized program based on your diagnosis and situation, designed to meet your specific needs.  With this comprehensive rehabilitation program, you will be expected to participate in at least 3 hours of rehabilitation therapies Monday-Friday, with modified therapy programming on the weekends.  Your rehabilitation program will include the following services:  Physical Therapy (PT), Occupational Therapy (OT), Speech Therapy (ST), 24 hour per day rehabilitation nursing, Therapeutic Recreaction (TR), Neuropsychology, Case Management (Social Worker), Rehabilitation Medicine, Nutrition Services and Pharmacy Services  Weekly team conferences will be held on Tuesdays to discuss your progress.  Your Social Worker will talk with you frequently to get your input and to update you on team discussions.  Team conferences with you and your family in attendance may also be held.  Expected length of stay: 10-12 days  Overall anticipated outcome: modified independent  Depending on your progress and recovery, your program may change. Your Social Worker will coordinate services and will keep you informed of any changes. Your Social Worker's name and contact numbers are listed  below.  The following services may also be recommended but are not provided by the Inpatient Rehabilitation Center:   Driving Evaluations  Home Health Rehabiltiation Services  Outpatient Rehabilitation Services  Vocational Rehabilitation   Arrangements will be made to provide these services after discharge if needed.  Arrangements include referral to agencies that provide these services.  Your insurance has been verified to be:  None (will complete Medicaid application prior to discharge) Your primary doctor is:  None (will need to  establish a primary care physician)  Pertinent information will be shared with your doctor and your insurance company.  Social Worker:  [place]cy [place], [place] [phone] or [phone]) [phone]   Information discussed with and copy given to patient by: Amada JupiterHOYLE, Kavish Lafitte, 07/06/2015, 12:39 PM

## 2015-07-06 NOTE — Progress Notes (Signed)
Dr. Janee Mornhompson and I did his dressing change today, it was well tolerated. Replaced adaptic to skin graft site with 4x4 WD packing to edges (not to skin graft area).  Then covered entire wound with ABD and tape. Wound drier than yesterday. Skin graft with 60% take. Good granulation tissue formation. Doing well with rehab. Continue dressing changes as prescribed twice a day.  Jorje GuildMegan Unnamed Zeien, PA-C General Surgery [facility] [phone]
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
## 2015-07-07 ENCOUNTER — Inpatient Hospital Stay (HOSPITAL_COMMUNITY): Payer: MEDICAID | Admitting: Occupational Therapy

## 2015-07-07 ENCOUNTER — Inpatient Hospital Stay (HOSPITAL_COMMUNITY): Payer: MEDICAID | Admitting: Physical Therapy

## 2015-07-07 MED ORDER — MENTHOL 3 MG MT LOZG
1.0000 | LOZENGE | OROMUCOSAL | Status: DC | PRN
  Administered 2015-07-07: 3 mg via ORAL
  Filled 2015-07-07: qty 9

## 2015-07-07 MED ORDER — NITROFURANTOIN MONOHYD MACRO 100 MG PO CAPS
100.0000 mg | ORAL_CAPSULE | Freq: Two times a day (BID) | ORAL | Status: AC
  Administered 2015-07-07 – 2015-07-13 (×14): 100 mg via ORAL
  Filled 2015-07-07 (×16): qty 1

## 2015-07-07 NOTE — Progress Notes (Signed)
Physical Therapy Session Note  Patient Details  Name: Raymond Bowen MRN: [id number] Date of Birth: Aug 04, 1975  Today's Date: 07/07/2015 PT Individual Time: 1100-1200 PT Individual Time Calculation (min): 60 min   Short Term Goals: Week 1:  PT Short Term Goal 1 (Week 1): Pt will transfer with LRAD and supervision PT Short Term Goal 2 (Week 1): Pt will tolerate at least 3 hours OOB daily to improve activity tolerance PT Short Term Goal 3 (Week 1): Pt will amb at least 50' with LRAD and min assist PT Short Term Goal 4 (Week 1): Pt will demonstrate dynamic standing balance with min assist   Skilled Therapeutic Interventions/Progress Updates:    Pt received resting in recliner and agreeable to therapy session.  Sit>stand from recliner with RW and supervision and PT instructed patient in ambulation x60' with RW and supervision.  Pt demos improvement in upright posture, maintaining walker position, control of RLE during gait, and weightbearing through RLE during stance phase.  Pt self propelled w/c remaining distance to gym.  PT administered BERG balance scale and patient demonstrates nearly 100% fall risk as noted by score of   26/56 on Berg Balance Scale.  PT educated patient on results and interpretation as well as interventions to improve score.  Pt performed ambulatory transfer to Nustep with RW and supervision and performed 2x3 min on Nustep at level 1 for activity tolerance and RLE NMR.  Pt transferred back to w/c and propelled self back to room at end of session.  Pt positioned in recliner with call bell in reach and needs met.   Therapy Documentation Precautions:  Precautions Precautions: Fall Precaution Comments: R foot pain, weakness Restrictions Weight Bearing Restrictions: No Pain: Pain Assessment Pain Assessment: 0-10 Pain Score: 10-Worst pain ever Pain Type: Acute pain Pain Location: Foot Pain Orientation: Right Pain Descriptors / Indicators: Aching Pain Frequency:  Constant Pain Onset: On-going Patients Stated Pain Goal: 4 Pain Intervention(s): Repositioned;Emotional support  Balance: Balance Balance Assessed: Yes Standardized Balance Assessment Standardized Balance Assessment: Berg Balance Test Berg Balance Test Sit to Stand: Able to stand  independently using hands Standing Unsupported: Able to stand 30 seconds unsupported Sitting with Back Unsupported but Feet Supported on Floor or Stool: Able to sit safely and securely 2 minutes Stand to Sit: Controls descent by using hands Transfers: Able to transfer with verbal cueing and /or supervision Standing Unsupported with Eyes Closed: Able to stand 10 seconds with supervision Standing Ubsupported with Feet Together: Needs help to attain position but able to stand for 30 seconds with feet together From Standing, Reach Forward with Outstretched Arm: Can reach forward >12 cm safely (5") From Standing Position, Pick up Object from Floor: Able to pick up shoe, needs supervision From Standing Position, Turn to Look Behind Over each Shoulder: Turn sideways only but maintains balance Turn 360 Degrees: Needs assistance while turning Standing Unsupported, Alternately Place Feet on Step/Stool: Needs assistance to keep from falling or unable to try Standing Unsupported, One Foot in Front: Loses balance while stepping or standing Standing on One Leg: Unable to try or needs assist to prevent fall Total Score: 26  See Function Navigator for Current Functional Status.   Therapy/Group: Individual Therapy  Earnest Conroy Penven-Crew 07/07/2015, 12:17 PM

## 2015-07-07 NOTE — Progress Notes (Signed)
Occupational Therapy Session Note  Patient Details  Name: Raymond Bowen MRN: [id number] Date of Birth: 23-May-1976  Today's Date: 07/07/2015 OT Individual Time: 0900-1025 OT Individual Time Calculation (min): 85 min    Short Term Goals: Week 1:  OT Short Term Goal 1 (Week 1): NA secondary to ELOS goals overall supervision to modified independent level.    Skilled Therapeutic Interventions/Progress Updates:    1:1 self care retraining at sink level with focus on sit to stands, short distance functional ambulation, standing balance and tolerance, proper positioning of right LE before standing on it, activity tolerance. Pt engaged in activity tolerance and UB endurance and strengthening to propel self to [place] entrance in w/c and half way back to his room on [address].  Engaged in d/c planning and after hospital follow up.  Pt with elevated HR in 130s towards end of session requiring extra time to return to 102.   2nd session:  2:30-3:00 (30min) 1:1 Pt sitting in recliner and very concerned about air escaping through Trachea stoma and changed bandage 3x with Rn supervision . Pt participated in watching in mirror for continued education regarding care of stoma. Pt perform short distance functional ambulation with extra time and with increased pain this pm in right LE. Pt left in family room with nursing to give meds.  Later assisted pt back to room and to transfer with supervision/ setup back into bed.   Therapy Documentation Precautions:  Precautions Precautions: Fall Precaution Comments: R foot pain, weakness Restrictions Weight Bearing Restrictions: No Pain: Pain Assessment Pain Assessment: 0-10 Pain Score: 9  Pain Type: Acute pain Pain Location: Foot Pain Orientation: Right Pain Descriptors / Indicators: Aching Pain Frequency: Constant Pain Onset: On-going Patients Stated Pain Goal: 4 Pain Intervention(s): Medication (See eMAR)  See Function Navigator for Current  Functional Status.   Therapy/Group: Individual Therapy  Roney MansSmith, Trinty Marken [facility] 07/07/2015, 10:30 AM

## 2015-07-07 NOTE — Progress Notes (Signed)
Ostomy leaking with 1pc [place]. Site cleaned, prepped and new 1pc bag placed with a wafer.  After full assessment of the site, RN and second RN felt necessary for 1pc bag with wafer better fit than 2pc and phlange.  Will continue to monitor site and output.
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
## 2015-07-08 ENCOUNTER — Inpatient Hospital Stay (HOSPITAL_COMMUNITY): Payer: Self-pay

## 2015-07-08 ENCOUNTER — Inpatient Hospital Stay (HOSPITAL_COMMUNITY): Payer: MEDICAID

## 2015-07-08 LAB — URINE CULTURE: Culture: >=100000

## 2015-07-08 LAB — BASIC METABOLIC PANEL
Anion gap: 7 (ref 5–15)
BUN: 8 mg/dL (ref 6–20)
CO2: 24 mmol/L (ref 22–32)
Calcium: 10.4 mg/dL — ABNORMAL HIGH (ref 8.9–10.3)
Chloride: 89 mmol/L — ABNORMAL LOW (ref 101–111)
Creatinine, Ser: 0.9 mg/dL (ref 0.61–1.24)
GFR calc Af Amer: >60 mL/min (ref >60)
GFR calc non Af Amer: >60 mL/min (ref >60)
Glucose, Bld: 103 mg/dL — ABNORMAL HIGH (ref 65–99)
Potassium: 4.2 mmol/L (ref 3.5–5.1)
Sodium: 120 mmol/L — ABNORMAL LOW (ref 135–145)

## 2015-07-08 LAB — SODIUM, URINE, RANDOM: Sodium, Ur: <10 mmol/L

## 2015-07-08 LAB — OSMOLALITY: Osmolality: 259 mOsm/kg — ABNORMAL LOW (ref 275–295)

## 2015-07-08 MED ORDER — DEMECLOCYCLINE HCL 150 MG PO TABS
300.0000 mg | ORAL_TABLET | Freq: Two times a day (BID) | ORAL | Status: DC
Start: 2015-07-08 — End: 2015-07-10
  Administered 2015-07-08 – 2015-07-10 (×5): 300 mg via ORAL
  Filled 2015-07-08 (×7): qty 2

## 2015-07-08 MED ORDER — MEGESTROL ACETATE 400 MG/10ML PO SUSP
400.0000 mg | Freq: Every day | ORAL | Status: DC
  Administered 2015-07-08 – 2015-07-15 (×8): 400 mg via ORAL
  Filled 2015-07-08 (×9): qty 10

## 2015-07-08 NOTE — Progress Notes (Signed)
[place] [facility] PHYSICAL MEDICINE & REHABILITATION     PROGRESS NOTE    Subjective/Complaints: Nausea and pain seem better. Still can't find an appetite-asked if he could have something appetite  ROS: Pt denies fever, rash/itching, headache, blurred or double vision, nausea, vomiting,   diarrhea, chest pain, shortness of breath, palpitations, dysuria, dizziness,   bleeding, anxiety, or depression   Objective: Vital Signs: Blood pressure 112/75, pulse 92, temperature 97.8 F (36.6 C), temperature source Oral, resp. rate 18, height 5\' 9"  (1.753 m), weight 72.7 kg (160 lb 4.4 oz), SpO2 99 %. No results found. No results for input(s): WBC, HGB, HCT, PLT in the last 72 hours.  Recent Labs  07/06/15 0527 07/08/15 0534  NA 123* 120*  K 4.4 4.2  CL 90* 89*  GLUCOSE 111* 103*  BUN 11 8  CREATININE 0.97 0.90  CALCIUM 10.8* 10.4*   CBG (last 3)  No results for input(s): GLUCAP in the last 72 hours.  Wt Readings from Last 3 Encounters:  07/07/15 72.7 kg (160 lb 4.4 oz)  07/03/15 72.8 kg (160 lb 7.9 oz)  11/16/14 79.379 kg (175 lb)    Physical Exam:  Constitutional: He is oriented to person, place, and time. He appears well-developed. No distress.  Anxious appearing male  .  HENT:  Head: Normocephalic and atraumatic.  Mouth/Throat: Oropharynx is clear and moist.  Eyes: Conjunctivae and EOM are normal. Pupils are equal, round, and reactive to light.  Neck: Normal range of motion. Neck supple. No tracheal deviation present. No thyromegaly present. Trach site pink with granulation Cardiovascular: Regular rhythm. Tachycardia present.  Respiratory: Effort normal. No respiratory distress. He has decreased breath sounds in the right lower field and the left lower field. He has no wheezes.  GI: Soft. Bowel sounds are normal. He exhibits no distension. There is tenderness   Abdomen with minimal distension. Ileostomy sealed--soft brown stool present. Abdomen with granulation/graft   Musculoskeletal: He exhibits edema. He exhibits no tenderness.  Min edema right thigh.  .  Neurological: He is alert and oriented to person, place, and time.  Able to follow basic commands without difficulty.  Motor: B/l UE 4+/5 proximal to distal RLE hip flexion 4/5, ankle dorsiflexion tr to 1/5, apf 2+/5 LLE: hip flexion 4/5, ankle dorsi/plantar flexion 4-/5  Skin: Skin is warm and dry.  Skin graft in abdomen appears intact---surrounding granulation tissue. Donor site healthy. Psychiatric: His speech is normal. His affect is blunt. He is withdrawn  Assessment/Plan: 1. Weakness, sensory loss secondary to polytrauma which require 3+ hours per day of interdisciplinary therapy in a comprehensive inpatient rehab setting. Physiatrist is providing close team supervision and 24 hour management of active medical problems listed below. Physiatrist and rehab team continue to assess barriers to discharge/monitor patient progress toward functional and medical goals.  Function:  Bathing Bathing position   Position: Wheelchair/chair at sink  Bathing parts Body parts bathed by patient: Right arm, Left arm, Chest, Front perineal area, Buttocks, Right upper leg, Left upper leg Body parts bathed by helper: Right lower leg, Left lower leg, Back  Bathing assist Assist Level: Touching or steadying assistance(Pt > 75%)      Upper Body Dressing/Undressing Upper body dressing   What is the patient wearing?: Pull over shirt/dress     Pull over shirt/dress - Perfomed by patient: Thread/unthread right sleeve, Thread/unthread left sleeve, Put head through opening, Pull shirt over trunk          Upper body assist Assist Level: Set  up   Set up : To obtain clothing/put away  Lower Body Dressing/Undressing Lower body dressing   What is the patient wearing?: Pants, Socks     Pants- Performed by patient: Thread/unthread left pants leg, Pull pants up/down Pants- Performed by helper: Thread/unthread  right pants leg   Non-skid slipper socks- Performed by helper: Don/doff right sock, Don/doff left sock                  Lower body assist Assist for lower body dressing: Touching or steadying assistance (Pt > 75%)      Toileting Toileting Toileting activity did not occur: Safety/medical concerns   Toileting steps completed by helper: Adjust clothing prior to toileting, Performs perineal hygiene, Adjust clothing after toileting    Toileting assist Assist level: More than reasonable time   Transfers Chair/bed transfer   Chair/bed transfer method: Stand pivot, Ambulatory Chair/bed transfer assist level: Touching or steadying assistance (Pt > 75%) Chair/bed transfer assistive device: Armrests, Patent attorney     Max distance: 60 Assist level: Supervision or verbal cues   Wheelchair   Type: Manual Max wheelchair distance: 150 Assist Level: No help, No cues, assistive device, takes more than reasonable amount of time  Cognition Comprehension Comprehension assist level: Understands complex 90% of the time/cues 10% of the time  Expression Expression assist level: Expresses complex 90% of the time/cues < 10% of the time  Social Interaction Social Interaction assist level: Interacts appropriately 90% of the time - Needs monitoring or encouragement for participation or interaction.  Problem Solving Problem solving assist level: Solves basic 90% of the time/requires cueing < 10% of the time  Memory Memory assist level: Recognizes or recalls 90% of the time/requires cueing < 10% of the time   Medical Problem List and Plan: 1. Functional deficits secondary to multitrauma   -continue CIR therapies  -it is likely that he will be disable due to his multiple injuries for at least one year 2. RLE DVT/Anticoagulation: Pharmaceutical: Pradexa bid 3. Pain Management:  Showing improvement.  Oxycodone prn  -continue fentanyl patch, decreased to at next change  today  -continue gabapentin  tid 4. Mood: LCSW to follow for evaluation and support.  5. Neuropsych: This patient is capable of making decisions on his own behalf. 6. Skin/Wound Care:  dressing changes to tid. Surgery to follow along for input.  7. Fluids/Electrolytes/Nutrition/hyponatremia:  Continue 1200 cc FR and monitor for hyponatermia. All labs personally reviewed  -bun/cr normal. Don't see any obvious sources for hyponatremia  -check urine sodium, serum osmality (run off this morning's lab)  -add demeclocycline, continue Na+ tabs  -will add megace bid for appetite 8. Reactive Leucocytosis: Monitor for signs of infection. Has been afebrile.   -ucx positive for ecoli-- macrobid x7 days 9. ABLA: hgb 10.6 today, due to trauma/blood loss/wounds.  10. Anxiety: ego suppport, pain control. 11. Tachycardia: Due to debility. 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 Added gabapentin for pain 13. Dizziness: checking orthostatic BP.      LOS (Days) 5 A FACE TO FACE EVALUATION WAS PERFORMED  Ziara Thelander T 07/08/2015 8:19 AM

## 2015-07-08 NOTE — Progress Notes (Signed)
Physical Therapy Session Note  Patient Details  Name: Raymond Bowen MRN: [id number] Date of Birth: [birth year]  Today's Date: 07/08/2015 PT Individual Time: [id number]; 1300-1330 PT Individual Time Calculation (min): 78 min , 30 min   Skilled Therapeutic Interventions/Progress Updates:   Tx 1:  Pt used urinal in sitting, modified independent. Recliner> stand with RW,  Min assist.  ACE wrap R ankle for foot drop; pt demonstrates R hip ER during gait with narrow BOS.  neuromuscular re-education via forced use, VCS for bil hip adduction/hip IR squeezing bolster between knees in unsupported sitting, x 10 with 5 second hold..  Gait with Rw on level tile x 25' with min guard assist, limited by R ankle pain.w/c propulsion  Using bil UEs to gym, modified independence.    MOd assist to mat in gym due to LOB R, no AD.  Dynamic stand balance activity using L/R hands to retrieve and place horseshoes on basketball goal rim at head height.  Pt unable to place items overhead due to abdominal pain. Dynamic sitting activity with feet and back unsupported to place horseshoes back in bin.  Pt tolerated standing x 1 minute x 2.  Gait on level tile 30', x 40', limited by fatigue.   Nu Step at level 1 x 10 minutes, bil UE and bil LE. PT propelled back to room.  W/c> recliner with RW, min assist.  Pt's LE s elevated and all needs placed within reach.  Tx 2:  W/c propulsion modified independent to gym using bil UEs.  W/c legrest manipulation with tactile cues and VCS.  neuromuscular re-education via demo, positioning and manual cues, for:  standing R hip abduction, R hip flexion, R/L hamstring curls, mini squats, step/ taps RLE to colored floor targets, x 10 each.  W/c> recliner close supervision with RW.  Pt left reclined in recliner with pillows under bil calves, with all needs within reach.    Therapy Documentation Precautions:  Precautions Precautions: Fall Precaution Comments: R foot pain,  weakness Restrictions Weight Bearing Restrictions: No   Vital Signs: Therapy Vitals Pulse Rate: (!) 112 BP: 118/82 mmHg (after 1 minute standing activity) Patient Position (if appropriate): Sitting Oxygen Therapy SpO2: 92 % Pain: Pain Assessment Pain Assessment: 0-10 Pain Score: 10-Worst pain ever Pain Type: Surgical pain Pain Location: Ankle Pain Orientation: Right Pain Descriptors / Indicators: Aching;Sore Pain Onset: On-going Pain Intervention(s): Medication (See eMAR)   See Function Navigator for Current Functional Status.   Therapy/Group: Individual Therapy  Adora Yeh 07/08/2015, 10:59 AM

## 2015-07-08 NOTE — Progress Notes (Signed)
Occupational Therapy Session Note  Patient Details  Name: Raymond Bowen MRN: [id number] Date of Birth: 11-Oct-1975  Today's Date: 07/08/2015 OT Individual Time: 0700-0800 and 1330-1400 OT Individual Time Calculation (min): 60 min and 30 min    Short Term Goals: Week 1:  OT Short Term Goal 1 (Week 1): NA secondary to ELOS goals overall supervision to modified independent level.    Skilled Therapeutic Interventions/Progress Updates:    Session 1: OT session focused on ADL retraining, standing balance, activity tolerance, and endurance. Pt completed bathing sit<>stand at sink with min A for balance and min cues for positioning of RLE. Pt required frequent rest breaks with HR increasing to 130 on one occasion, requiring extra time to return to low 100s. Pt completed BUE strengthening exercises using 10# weight for 10 reps. Pt required rest break following each exercise and HR remaining under 120 through. Pt returned to room and left with all needs in reach.   Session 2: OT session focused on standing balance and activity tolerance. Pt required mod cues for participation this afternoon due to pain and fatigue, however agreeable to complete session in room. Pt completed standing balance activity requiring him to reach out of BOS with LOB on 2 occasions. Pt tolerated standing 45 sec-1 minute before requiring rest break. HR 125-130 immediately following activity, requiring extended time to decrease to 100s.  Therapy Documentation Precautions:  Precautions Precautions: Fall Precaution Comments: R foot pain, weakness Restrictions Weight Bearing Restrictions: No General:   Vital Signs: Therapy Vitals Temp: 97.8 F (36.6 C) Temp Source: Oral Pulse Rate: 92 Resp: 18 BP: 112/75 mmHg Patient Position (if appropriate): Orthostatic Vitals Oxygen Therapy SpO2: 99 % O2 Device: Not Delivered Pain: 10/10 pain in RLE during standing and functional transfers. RN notified  See Function Navigator  for Current Functional Status.   Therapy/Group: Individual Therapy  Daneil Danerkinson, Renold Kozar N 07/08/2015, 7:38 AM

## 2015-07-09 ENCOUNTER — Inpatient Hospital Stay (HOSPITAL_COMMUNITY): Payer: MEDICAID | Admitting: *Deleted

## 2015-07-09 ENCOUNTER — Inpatient Hospital Stay (HOSPITAL_COMMUNITY): Payer: MEDICAID | Admitting: Physical Therapy

## 2015-07-09 LAB — BASIC METABOLIC PANEL
Anion gap: 10 (ref 5–15)
BUN: 9 mg/dL (ref 6–20)
CO2: 20 mmol/L — ABNORMAL LOW (ref 22–32)
Calcium: 10.4 mg/dL — ABNORMAL HIGH (ref 8.9–10.3)
Chloride: 93 mmol/L — ABNORMAL LOW (ref 101–111)
Creatinine, Ser: 0.85 mg/dL (ref 0.61–1.24)
GFR calc Af Amer: >60 mL/min (ref >60)
GFR calc non Af Amer: >60 mL/min (ref >60)
Glucose, Bld: 101 mg/dL — ABNORMAL HIGH (ref 65–99)
Potassium: 4.5 mmol/L (ref 3.5–5.1)
Sodium: 123 mmol/L — ABNORMAL LOW (ref 135–145)

## 2015-07-09 NOTE — Progress Notes (Signed)
Physical Therapy Session Note  Patient Details  Name: Raymond Bowen MRN: [id number] Date of Birth: [birth year]  Today's Date: 07/09/2015 PT Individual Time: 1445-1515 PT Individual Time Calculation (min): 30 min   Short Term Goals: Week 1:  PT Short Term Goal 1 (Week 1): Pt will transfer with LRAD and supervision PT Short Term Goal 2 (Week 1): Pt will tolerate at least 3 hours OOB daily to improve activity tolerance PT Short Term Goal 3 (Week 1): Pt will amb at least 50' with LRAD and min assist PT Short Term Goal 4 (Week 1): Pt will demonstrate dynamic standing balance with min assist  Week 2:     Skilled Therapeutic Interventions/Progress Updates:  Pt needed encouragement for bedside tx this afternoon, declined attempt at gait. RLE very tender and swollen, aware of recent DVT and discussed with RW who came to check. TED hose donned LLE, ordered XL for RLE.   Pt instructed in seated and standing therex including each of the following 2x10 with cues for technique:  Ankle pumps (AAROM on R), LAQ with 5 sec hold, marching in sitting and RLE in staning, standing R hip ABD, and armrest push-ups, all with rest break between sets.   Pt left up in recliner with RN present.      Therapy Documentation Precautions:  Precautions Precautions: Fall Precaution Comments: R foot pain, weakness Restrictions Weight Bearing Restrictions: No General:   Vital Signs: Therapy Vitals Temp: 98.7 F (37.1 C) Temp Source: Oral Pulse Rate: 93 Resp: 18 BP: 106/68 mmHg Patient Position (if appropriate): Sitting Oxygen Therapy SpO2: 100 % O2 Device: Not Delivered Pain: Pain Assessment Pain Assessment: 0-10 Pain Score: 9  Pain Type: Acute pain Pain Location: Leg Pain Orientation: Right Pain Descriptors / Indicators: Throbbing Pain Onset: On-going Pain Intervention(s): Modified tx, RN made aware, TED hose for LLE  See Function Navigator for Current Functional Status.   Therapy/Group:  Individual Therapy   Clydene Lamingole Conor Lata, PT, DPT   Eulogio DitchKAMPEN, Richardson DoppCOLE M 07/09/2015, 3:22 PM

## 2015-07-09 NOTE — Progress Notes (Signed)
[place] PHYSICAL MEDICINE & REHABILITATION     PROGRESS NOTE    Subjective/Complaints: Appetite much better better yesterday and today--he is quite excited. Having some pain in his right leg when he urinates  ROS: Pt denies fever, rash/itching, headache, blurred or double vision, nausea, vomiting,   diarrhea, chest pain, shortness of breath, palpitations, dysuria, dizziness,   bleeding, anxiety, or depression   Objective: Vital Signs: Blood pressure 117/80, pulse 105, temperature 98.7 F (37.1 C), temperature source Oral, resp. rate 20, height 5\' 9"  (1.753 m), weight 68.9 kg (151 lb 14.4 oz), SpO2 100 %. No results found. No results for input(s): WBC, HGB, HCT, PLT in the last 72 hours.  Recent Labs  07/08/15 0534 07/09/15 0500  NA 120* 123*  K 4.2 4.5  CL 89* 93*  GLUCOSE 103* 101*  BUN 8 9  CREATININE 0.90 0.85  CALCIUM 10.4* 10.4*   CBG (last 3)  No results for input(s): GLUCAP in the last 72 hours.  Wt Readings from Last 3 Encounters:  07/09/15 68.9 kg (151 lb 14.4 oz)  07/03/15 72.8 kg (160 lb 7.9 oz)  11/16/14 79.379 kg (175 lb)    Physical Exam:  Constitutional: He is oriented to person, place, and time. He appears well-developed. No distress.  Anxious appearing male  .  HENT:  Head: Normocephalic and atraumatic.  Mouth/Throat: Oropharynx is clear and moist.  Eyes: Conjunctivae and EOM are normal. Pupils are equal, round, and reactive to light.  Neck: Normal range of motion. Neck supple. No tracheal deviation present. No thyromegaly present. Trach site pink with granulation Cardiovascular: Regular rhythm. Tachycardia present.  Respiratory: Effort normal. No respiratory distress. He has decreased breath sounds in the right lower field and the left lower field. He has no wheezes.  GI: Soft. Bowel sounds are normal. He exhibits no distension. There is tenderness   Abdomen with minimal distension. Ileostomy sealed--soft brown stool present. Abdomen with  granulation/graft  Musculoskeletal: He exhibits edema. He exhibits no tenderness.  Min edema right thigh.  .  Neurological: He is alert and oriented to person, place, and time.  Able to follow basic commands without difficulty.  Motor: B/l UE 4+/5 proximal to distal RLE hip flexion 4/5, ankle dorsiflexion tr to 1/5, apf 2+/5 LLE: hip flexion 4/5, ankle dorsi/plantar flexion 4-/5  Skin: Skin is warm and dry.  Skin graft in abdomen appears intact---surrounding granulation tissue. Donor site healthy. Psychiatric: His speech is normal. His affect is blunt. He is withdrawn  Assessment/Plan: 1. Weakness, sensory loss secondary to polytrauma which require 3+ hours per day of interdisciplinary therapy in a comprehensive inpatient rehab setting. Physiatrist is providing close team supervision and 24 hour management of active medical problems listed below. Physiatrist and rehab team continue to assess barriers to discharge/monitor patient progress toward functional and medical goals.  Function:  Bathing Bathing position   Position: Wheelchair/chair at sink  Bathing parts Body parts bathed by patient: Right arm, Left arm, Chest, Front perineal area, Buttocks, Right upper leg, Left upper leg Body parts bathed by helper: Right lower leg, Left lower leg, Back  Bathing assist Assist Level: Touching or steadying assistance(Pt > 75%)      Upper Body Dressing/Undressing Upper body dressing   What is the patient wearing?: Pull over shirt/dress     Pull over shirt/dress - Perfomed by patient: Thread/unthread right sleeve, Thread/unthread left sleeve, Put head through opening, Pull shirt over trunk          Upper body  assist Assist Level: Set up   Set up : To obtain clothing/put away  Lower Body Dressing/Undressing Lower body dressing   What is the patient wearing?: Pants, Socks     Pants- Performed by patient: Thread/unthread left pants leg, Pull pants up/down Pants- Performed by helper:  Thread/unthread right pants leg   Non-skid slipper socks- Performed by helper: Don/doff right sock, Don/doff left sock                  Lower body assist Assist for lower body dressing: Touching or steadying assistance (Pt > 75%)      Toileting Toileting Toileting activity did not occur: Safety/medical concerns   Toileting steps completed by helper: Adjust clothing prior to toileting, Performs perineal hygiene, Adjust clothing after toileting    Toileting assist Assist level: More than reasonable time   Transfers Chair/bed transfer   Chair/bed transfer method: Stand pivot Chair/bed transfer assist level: Moderate assist (Pt 50 - 74%/lift or lower) Chair/bed transfer assistive device: Armrests     Locomotion Ambulation     Max distance: 40 Assist level: Touching or steadying assistance (Pt > 75%)   Wheelchair   Type: Manual Max wheelchair distance: 150 Assist Level: No help, No cues, assistive device, takes more than reasonable amount of time  Cognition Comprehension Comprehension assist level: Understands complex 90% of the time/cues 10% of the time  Expression Expression assist level: Expresses complex 90% of the time/cues < 10% of the time  Social Interaction Social Interaction assist level: Interacts appropriately 90% of the time - Needs monitoring or encouragement for participation or interaction.  Problem Solving Problem solving assist level: Solves basic 90% of the time/requires cueing < 10% of the time  Memory Memory assist level: Recognizes or recalls 90% of the time/requires cueing < 10% of the time   Medical Problem List and Plan: 1. Functional deficits secondary to multitrauma   -continue CIR therapies    2. RLE DVT/Anticoagulation: Pharmaceutical: Pradexa bid 3. Pain Management:  Showing improvement.  Oxycodone prn  -continue fentanyl patch, decreased to at next change today  -continue gabapentin  tid 4. Mood: LCSW to follow for evaluation  and support.  5. Neuropsych: This patient is capable of making decisions on his own behalf. 6. Skin/Wound Care:  dressing changes to tid. Surgery to follow along for input.  7. Fluids/Electrolytes/Nutrition/hyponatremia:  Serum sodium up to 123 today. Serum osmolality slightly low, Urine sodium wnl---likely more nutritional than anything else  -Continue 1200 cc FR for the time being  -continue demeclocycline, continue Na+ tabs  -added megace bid for appetite WITH GREAT RESULTS YESTERDAY ALREADY!!  -check daily labs---improved nutrition likely the key here 8. Reactive Leucocytosis: Monitor for signs of infection. Has been afebrile.   -ucx positive for ecoli-- macrobid x7 days 9. ABLA: hgb 10.6 today, due to trauma/blood loss/wounds.  10. Anxiety: ego suppport, pain control. 11. Tachycardia: Due to debility. 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 Added gabapentin for pain 13. Dizziness: checking orthostatic BP.      LOS (Days) 6 A FACE TO FACE EVALUATION WAS PERFORMED  Maci Eickholt T 07/09/2015 8:27 AM

## 2015-07-09 NOTE — Progress Notes (Signed)
Physical Therapy Session Note  Patient Details  Name: Raymond LemmingsBrian K Jalloh MRN: [id number] Date of Birth: 1975-08-28  Today's Date: 07/09/2015 PT Individual Time: 0900-1000 PT Individual Time Calculation (min): 60 min   Short Term Goals: Week 1:  PT Short Term Goal 1 (Week 1): Pt will transfer with LRAD and supervision PT Short Term Goal 2 (Week 1): Pt will tolerate at least 3 hours OOB daily to improve activity tolerance PT Short Term Goal 3 (Week 1): Pt will amb at least 50' with LRAD and min assist PT Short Term Goal 4 (Week 1): Pt will demonstrate dynamic standing balance with min assist   Skilled Therapeutic Interventions/Progress Updates:   Session focused on community wheelchair mobility and RLE NMR. Patient sitting in bed, reporting increased RLE pain and swelling this date. Patient educated on importance of mobility and elevating RLE above heart when resting to decrease swelling. Patient educated on ankle pumps to decrease swelling in LLE but unable to perform RLE due to foot drop. Sitting edge of bed, patient performed UB and LB dressing with setup assist to obtain clothing and lateral leans to pull pants over hips and ostomy bag with increased time. Patient performed squat pivot transfers with supervision. Patient propelled wheelchair throughout hospital, on/off elevators, over thresholds, on uneven surfaces, and on inclines/declines with mod I and min encouragement to attempt inclines without assist. Patient required one rest break after propelling wheelchair outdoors before returning to rehab unit. Patient performed NuStep using BUE/BLE at level 1 x 10 min for RLE NMR and ROM. Patient propelled wheelchair back to room and left sitting in recliner with BLE elevated and all needs within reach.   Therapy Documentation Precautions:  Precautions Precautions: Fall Precaution Comments: R foot pain, weakness Restrictions Weight Bearing Restrictions: No Pain: Pain Assessment Pain Assessment:  0-10 Pain Score: 10-Worst pain ever Pain Type: Acute pain Pain Location: Leg Pain Orientation: Right Pain Descriptors / Indicators: Throbbing (swollen) Pain Onset: On-going Pain Intervention(s): Ambulation/increased activity;Repositioned;Rest   See Function Navigator for Current Functional Status.   Therapy/Group: Individual Therapy  Kerney ElbeVarner, Shereena Berquist A 07/09/2015, 9:45 AM

## 2015-07-10 ENCOUNTER — Inpatient Hospital Stay (HOSPITAL_COMMUNITY): Payer: MEDICAID | Admitting: Occupational Therapy

## 2015-07-10 ENCOUNTER — Inpatient Hospital Stay (HOSPITAL_COMMUNITY): Payer: MEDICAID | Admitting: Physical Therapy

## 2015-07-10 LAB — BASIC METABOLIC PANEL
Anion gap: 8 (ref 5–15)
BUN: 8 mg/dL (ref 6–20)
CO2: 22 mmol/L (ref 22–32)
Calcium: 10.6 mg/dL — ABNORMAL HIGH (ref 8.9–10.3)
Chloride: 96 mmol/L — ABNORMAL LOW (ref 101–111)
Creatinine, Ser: 0.83 mg/dL (ref 0.61–1.24)
GFR calc Af Amer: >60 mL/min (ref >60)
GFR calc non Af Amer: >60 mL/min (ref >60)
Glucose, Bld: 100 mg/dL — ABNORMAL HIGH (ref 65–99)
Potassium: 4.3 mmol/L (ref 3.5–5.1)
Sodium: 126 mmol/L — ABNORMAL LOW (ref 135–145)

## 2015-07-10 MED ORDER — GABAPENTIN 100 MG PO CAPS
200.0000 mg | ORAL_CAPSULE | Freq: Three times a day (TID) | ORAL | Status: DC
  Administered 2015-07-10 – 2015-07-13 (×9): 200 mg via ORAL
  Filled 2015-07-10 (×9): qty 2
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
## 2015-07-10 NOTE — Plan of Care (Signed)
Problem: RH BOWEL ELIMINATION Goal: RH STG MANAGE BOWEL WITH ASSISTANCE STG Manage ostomy with mod I Assistance.  Outcome: Not Progressing Patient does not wish to participate in ostomy care.  States "My sister is going to do it"

## 2015-07-10 NOTE — Progress Notes (Signed)
Patient ID: Raymond Bowen, male   DOB: 15-Jun-1976, 39 y.o.   MRN: [id number] I changed his abdominal dressing. Centrally, STSG remains intact - adaptic placed over that. WTD dressing replaced on surrounding granulation tissue. Significantly cleaner. I also spoke with his mother and aunt. Raymond GelinasBurke Aquan Kope, MD, MPH, FACS Trauma: [phone] General Surgery: [phone]

## 2015-07-10 NOTE — Progress Notes (Signed)
Physical Therapy Weekly Progress Note  Patient Details  Name: Raymond Bowen MRN: [id number] Date of Birth: April 23, 1976  Beginning of progress report period: July 04, 2015 End of progress report period: July 10, 2015  Today's Date: 07/10/2015 PT Individual Time: 1405-1505 PT Individual Time Calculation (min): 60 min   Patient has met 4 of 4 short term goals.  Pt is making progress towards LTGs, however continues to be limited by pain in abdomen and RLE.   Patient continues to demonstrate the following deficits: coordination, endurance, activity tolerance, strength, and balance and therefore will continue to benefit from skilled PT intervention to enhance overall performance with activity tolerance, balance, functional use of  right lower extremity and coordination.  Patient progressing toward long term goals..  Continue plan of care.  PT Short Term Goals Week 2:  PT Short Term Goal 1 (Week 2): =LTGs due to ELOS  Skilled Therapeutic Interventions/Progress Updates:    Pt received resting in recliner, reporting tightness in abdomen but agreeable to therapy session.  Session focus on patient education for progress towards goals and discharge planning, dynamic sitting balance, activity tolerance, and gait training.  Pt requesting to empty ostomy bag prior to ambulation and able to do so with set up assist.  Sit>stand from recliner with RW and supervision and amb x106' with RW and close supervision.  Verbal cues for placement of RLE, increased BOS, upright posture, and walker positioning.  Pt requesting seated rest break, HR up to 126.  PT provided pt education on effects on deconditioning on vitals and energy.  Pt verbalized understanding, education to continue.  Pt performed squat>pivot to w/c with supervision and propelled self remaining distance to therapy gym.  Squat/pivot to [employer] with supervision and pt performed 10 min on Nustep at level 2 with no rest breaks for LE strength and  overall endurance.  Pt transferred back to w/c and propelled back to room.  Amb transfer from w/c>recliner with RW and supervision.  Pt positioned with call bell in reach and needs met.   Therapy Documentation Precautions:  Precautions Precautions: Fall Precaution Comments: R foot pain, weakness Restrictions Weight Bearing Restrictions: No Pain: Pain Assessment Pain Assessment: 0-10 Pain Score: 5  Pain Location: Abdomen Pain Orientation: Mid;Lower Pain Descriptors / Indicators: Tightness Pain Intervention(s): Ambulation/increased activity;RN made aware;Emotional support   See Function Navigator for Current Functional Status.  Therapy/Group: Individual Therapy  Earnest Conroy Penven-Crew 07/10/2015, 5:03 PM

## 2015-07-10 NOTE — Progress Notes (Signed)
Occupational Therapy Session Note  Patient Details  Name: Raymond Bowen MRN: [id number] Date of Birth: 29-Dec-1975  Today's Date: 07/10/2015 OT Individual Time: [id number] and [id number] OT Individual Time Calculation (min): 56 min and 17 minutes ( 28 minutes missed secondary to fatigue and increased pain)   Short Term Goals: Week 1:  OT Short Term Goal 1 (Week 1): NA secondary to ELOS goals overall supervision to modified independent level.    Skilled Therapeutic Interventions/Progress Updates:  Session 1: Upon entering the room, pt supine in bed with c/o 5/10 pain in R LE described as aching. Pt required increased encouragement for participation. Pt performed supine >sit with mod I this session and use of bed rails. Pt performed squat pivot transfer bed >wheelchair with supervision and min verbal cues for proper technique. Pt asked to wash at sink side and dress from wheelchair. Pt requiring steady assistance for sit <>stand for safety with LB bathing and clothing management. Teds donned on B LEs with total A by therapist. Pt ambulating 15' with RW and steady assist for safety to recliner chair. B LEs elevated at end of session with family present and all needs within reach.   Session 2: Upon entering the room, pt reclined in chair with B LEs elevated and recreational therapist present in the room. Pt reporting increased fatigue , "There have been people in here back to back. I can't get any rest." Pt also reporting 7/10 pain in abdomen this session but pain medication not due for another hour. OT provided paper handout of energy conservation with self care tasks and general principles. OT educating pt with practical examples such as doctors appointments with pt participating in conversation. Pt asking to rest at this time and sister entering the room. Pt remained in recliner chair with call bell and all needed items within reach.   Therapy Documentation Precautions:  Precautions Precautions:  Fall Precaution Comments: R foot pain, weakness Restrictions Weight Bearing Restrictions: No General:   Vital Signs:  Pain: Pain Assessment Pain Assessment: 0-10 Pain Score: 2  Pain Type: Acute pain Pain Location: Foot Pain Orientation: Right Pain Descriptors / Indicators: Aching Pain Onset: Gradual Pain Intervention(s): Medication (See eMAR)  See Function Navigator for Current Functional Status.   Therapy/Group: Individual Therapy  Lowella Gripittman, Crimson Dubberly L 07/10/2015, 11:31 AM

## 2015-07-10 NOTE — Plan of Care (Signed)
Problem: RH Ambulation Goal: LTG Patient will ambulate in controlled environment (PT) LTG: Patient will ambulate in a controlled environment, # of feet with assistance (PT).  Downgraded due to activity tolerance Goal: LTG Patient will ambulate in home environment (PT) LTG: Patient will ambulate in home environment, # of feet with assistance (PT).  Downgraded due to activity tolerance and safety

## 2015-07-11 ENCOUNTER — Inpatient Hospital Stay (HOSPITAL_COMMUNITY): Payer: MEDICAID | Admitting: Occupational Therapy

## 2015-07-11 ENCOUNTER — Inpatient Hospital Stay (HOSPITAL_COMMUNITY): Payer: Self-pay | Admitting: Physical Therapy

## 2015-07-11 DIAGNOSIS — S21202S Unspecified open wound of left back wall of thorax without penetration into thoracic cavity, sequela: Secondary | ICD-10-CM

## 2015-07-11 LAB — BASIC METABOLIC PANEL
Anion gap: 6 (ref 5–15)
BUN: 7 mg/dL (ref 6–20)
CO2: 19 mmol/L — ABNORMAL LOW (ref 22–32)
Calcium: 10.9 mg/dL — ABNORMAL HIGH (ref 8.9–10.3)
Chloride: 101 mmol/L (ref 101–111)
Creatinine, Ser: 0.8 mg/dL (ref 0.61–1.24)
GFR calc Af Amer: >60 mL/min (ref >60)
GFR calc non Af Amer: >60 mL/min (ref >60)
Glucose, Bld: 90 mg/dL (ref 65–99)
Potassium: 4.5 mmol/L (ref 3.5–5.1)
Sodium: 126 mmol/L — ABNORMAL LOW (ref 135–145)

## 2015-07-11 NOTE — Plan of Care (Signed)
Problem: RH Toileting Goal: LTG Patient will perform toileting w/assist, cues/equip (OT) LTG: Patient will perform toiletiing (clothes management/hygiene) with assist, with/without cues using equipment (OT)  Downgraded secondary to safety with task  Problem: RH Toilet Transfers Goal: LTG Patient will perform toilet transfers w/assist (OT) LTG: Patient will perform toilet transfers with assist, with/without cues using equipment (OT)  Downgraded secondary to safety  Problem: RH Tub/Shower Transfers Goal: LTG Patient will perform tub/shower transfers w/assist (OT) LTG: Patient will perform tub/shower transfers with assist, with/without cues using equipment (OT)  Outcome: Not Applicable Date Met:  68/15/94 Pt is not cleared to shower at this time secondary to large wound. Goal discharged as not appropriate at this time.

## 2015-07-11 NOTE — Progress Notes (Signed)
Occupational Therapy Session Note  Patient Details  Name: Raymond LemmingsBrian K Loiselle MRN: [id number] Date of Birth: May 01, 1976  Today's Date: 07/11/2015 OT Individual Time: 0700-0750 and 1115-1200 OT Individual Time Calculation (min): 50 min and 45 min   Short Term Goals: Week 1:  OT Short Term Goal 1 (Week 1): NA secondary to ELOS goals overall supervision to modified independent level.    Skilled Therapeutic Interventions/Progress Updates:  Session 1: Upon entering the room, pt supine in bed with with 6/10 c/o pain in R foot. Pt reports he did not sleep well but agreeable to OT intervention this session. Pt performed supine >sit with mod I to EOB. Squat pivot transfer from bed >wheelchair with supervision. Pt propelled wheelchair to dresser to obtain clothing items from drawer. Pt performed dressing and bathing at sink side from wheelchair with sit <>stand. Pt showing good safety awareness and sit <>stand with close supervision. Pt required mod A for LB dressing this session and discussed use of possible AE to increase I this session. Pt ambulating 10' in room from sink to recliner chair for breakfast tray to be placed in front of him. Call bell and all needed items within reach upon exiting the room.   Session 2: Upon entering the room, pt in recliner chair with NT entering the room. Pt required set up to empty ostomy bag himself. OT issuing pt long handled sponge in order to increase I with LB bathing this session. OT educated and demonstrated use of sock aid, long handled reacher, and shoe horn in order to increase I with LB dressing. Pt returned demonstrations with min verbal cues for proper technique. Pt able to cross L LE over R in order to don shoe but unable to with R LE secondary to increased pain in abdomen. Pt very motivated with use of equipment. Pt also reviewing energy conservation handouts from session yesterday. Pt asking questions for clarification on topic as appropriate. Energy conservation  discussed with christmas plans in mind in order to discuss/recommend ways to be active with family but also give body the rest it needs. B LEs elevated at end of session while seated in recliner chair. Call bell and all needed items within reach.   Therapy Documentation Precautions:  Precautions Precautions: Fall Precaution Comments: R foot pain, weakness Restrictions Weight Bearing Restrictions: No General:   Vital Signs: Therapy Vitals Temp: 98.2 F (36.8 C) Temp Source: Oral Pulse Rate: 90 Resp: 16 BP: 116/79 mmHg Patient Position (if appropriate): Lying Oxygen Therapy SpO2: 100 % O2 Device: Not Delivered  See Function Navigator for Current Functional Status.   Therapy/Group: Individual Therapy  Lowella Gripittman, Lameisha Schuenemann L 07/11/2015, 8:02 AM

## 2015-07-11 NOTE — Progress Notes (Signed)
Orthopedic Tech Progress Note Patient Details:  Raymond Bowen [birth year] [id number]  Patient ID: Raymond Bowen, male   DOB: [birth year], 39 y.o.   MRN: [id number] Called in advanced brace order. Spoke with Tonny BollmanShameka  Talvin Christianson 07/11/2015, 3:16 PM
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
## 2015-07-11 NOTE — Consult Note (Signed)
WOC reviewed chart, it is noted patient does not plan to participate in ostomy care.  Need to arrange time with sister for teaching.  Will notify rehab staff as well.   Davina PokeM. Scottie Stanish RN, CWOCN [phone]

## 2015-07-11 NOTE — Progress Notes (Signed)
Occupational Therapy Weekly Progress Note  Patient Details  Name: Raymond Bowen MRN: [id number] Date of Birth: 19-Oct-1975  Beginning of progress report period: July 04, 2015 End of progress report period: July 11, 2015  Today's Date: 07/11/2015 OT Individual Time: 1515-1600 OT Individual Time Calculation (min): 45 min    Patient has met 2 of 9 long term goals.  Short term goals not set due to estimated length of stay.  Pt with increased motivation for therapeutic interventions this week. Pt making steady progress towards all goal. Pt has been educated on AE to increase I with LB ADL tasks. Pt performs stand pivot transfer with supervision from bed <>wheelchair <>recliner chair. Bathing performed at sink as pt is not medically cleared for shower. Pt continues to fatigue easily with energy conservation education initiated as well.   Patient continues to demonstrate the following deficits: decreased I in self care, decreased balance, decreased strength, decreased functional mobility/transfers, decreased activity tolerance, decreased standing tolerance, increased pain and therefore will continue to benefit from skilled OT intervention to enhance overall performance with BADL.  See Patient's Care Plan for progression toward long term goals.  Patient progressing toward long term goals..  Plan of care revisions: goals downgraded to supervision for safety . Shower transfer goal discharged secondary to pt not medically cleared to shower at this time. Pt also requesting to discontinue simple meal prep goals as pt states, "Someone else will be doing that when I go home."  Skilled Therapeutic Interventions/Progress Updates:  Pt seated in recliner chair upon entering the room with 4/10 c/o pain in R foot this session described as sharp electrical sensation. Pt agreeable to OT intervention this session. OT notified pt of progress towards OT goals with plans for upcoming discharge from hospital. OT  answering questions until pt felt an understanding for discharge process. Pt declined ambulation initially as he reports, "Foot (right) hurts from my last session." Pt transferred from reclined chair > wheelchair with stand pivot transfer and supervision. Pt propelled self in wheelchair with B UEs to dayroom ~75' with supervision. Pt obtained food items and returned to table. OT discussed with pt the importance of safety and energy conservation when at home and during the holidays in order to decrease fall risk. Pt verbalized understanding. He discussed some concerns he had regarding returning home as well. Pt returned to room via wheelchair. He removed leg rest and ambulated 15' with RW and supervision to sit in recliner chair. OT elevated B LEs with call bell and all needed items within reach upon exiting the room.   Therapy Documentation Precautions:  Precautions Precautions: Fall Precaution Comments: R foot pain, weakness Restrictions Weight Bearing Restrictions: No Vital Signs: Therapy Vitals Temp: 98.2 F (36.8 C) Temp Source: Oral Pulse Rate: 90 Resp: 16 BP: 116/79 mmHg Patient Position (if appropriate): Lying Oxygen Therapy SpO2: 100 % O2 Device: Not Delivered   See Function Navigator for Current Functional Status.   Therapy/Group: Individual Therapy  Phineas Semen 07/11/2015, 8:50 AM

## 2015-07-11 NOTE — Progress Notes (Signed)
Spoke with Rosero RN concerning PICC line. We recommend triple lumen PICC to be removed because line is no longer necessary/our goal is keep patient from developing CLABI's. RN to communicate with MD concerning this request. RN to notify IV team with MD's decision. Eliot FordSarah Osmel Dykstra RN [facility]

## 2015-07-11 NOTE — Progress Notes (Signed)
Physical Therapy Session Note  Patient Details  Name: Raymond LemmingsBrian K Sparr MRN: [id number] Date of Birth: 07/14/1976  Today's Date: 07/11/2015 PT Individual Time: 1307-1400; 53 min  Short Term Goals: Week 2:  PT Short Term Goal 1 (Week 2): =LTGs due to ELOS  Skilled Therapeutic Interventions/Progress Updates:   Pt received in recliner after lunch with R shoe doffed; pt reporting increased numbness, tingling and pain in R foot today, R foot also noted to be edematous-LE in dependent position.  RN notified for pain medication.  Re-emphasized importance of maintaining LE in elevated position when resting in between therapy sessions for edema and pain management.  Pt verbalized understanding.  Pt agreeable to AFO trial.  Pt performed sit > stand from recliner and ambulated to w/c with RW with min A due to pt only wearing L shoe.  Pt performed w/c mobility to gym mod I.  Attempted multiple times to don PLS with R shoe but due to edema and hypersensitivity to pain pt unable to tolerate.  Changed to velcro Foot up Brace.  Performed gait x 25' x 2 with Foot Up Brace and RW with min A with improved foot clearance pt reporting discomfort from velcro strap on lower RLE.  Discussed home entry/exit-pt reports 4-5 STE with R rail.  Discussed sequence for ascending/descending laterally with bilat UE support on R rail.  As pt went to WB through RLE and advance LLE onto first step pt's RLE buckled and required max A to recover.  Therapist provided mod A and facilitation of weight shifting and stabilization of RLE in stance as well as verbal cues for safety and sequencing to negotiation up/down 4 stairs laterally.  Returned to w/c to doff Foot Up brace secondary to discomfort and inability to provide adequate knee control.  Will discuss with orthotist tomorrow during assessment.  In standing performed terminal R knee and hip extension training with pt activating against resistance of orange theraband behind R knee x 2 sets x 10  reps with bilat UE support on RW.  Returned to room in w/c and transferred back to recliner with min A; R shoe doffed for comfort and bilat LE elevated for edema management.  Pt left with all items within reach.    Therapy Documentation Precautions:  Precautions Precautions: Fall Precaution Comments: R foot pain, weakness Restrictions Weight Bearing Restrictions: No Pain: Pain Assessment Pain Assessment: 0-10 Pain Score: 4  Pain Type: Acute pain Pain Location: Foot Pain Orientation: Right Pain Descriptors / Indicators: Aching Pain Frequency: Constant Pain Onset: On-going Patients Stated Pain Goal: 2 Pain Intervention(s): Medication (See eMAR);Repositioned Multiple Pain Sites: No   See Function Navigator for Current Functional Status.   Therapy/Group: Individual Therapy  Edman CircleHall, Audra Wil[facility] Hospital[facility]aucette 07/11/2015, 8:05 PM

## 2015-07-11 NOTE — Plan of Care (Signed)
Problem: RH Simple Meal Prep Goal: LTG Patient will perform simple meal prep w/assist (OT) LTG: Patient will perform simple meal prep with assistance, with/without cues (OT).  Outcome: Not Applicable Date Met:  47/82/95 Pt requests to discontinue goal as he reports, "Someone else will do this for me when I get home."

## 2015-07-11 NOTE — Progress Notes (Signed)
[facility] PHYSICAL MEDICINE & REHABILITATION     PROGRESS NOTE    Subjective/Complaints: Slept better/ pain more tolerable right foot. Appetite still good. Asked about dc date.   ROS: Pt denies fever, rash/itching, headache, blurred or double vision, nausea, vomiting,   diarrhea, chest pain, shortness of breath, palpitations, dysuria, dizziness,   bleeding, anxiety, or depression   Objective: Vital Signs: Blood pressure 116/79, pulse 90, temperature 98.2 F (36.8 C), temperature source Oral, resp. rate 16, height 5\' 9"  (1.753 m), weight 72 kg (158 lb 11.7 oz), SpO2 100 %. No results found. No results for input(s): WBC, HGB, HCT, PLT in the last 72 hours.  Recent Labs  07/10/15 0527 07/11/15 0445  NA 126* 126*  K 4.3 4.5  CL 96* 101  GLUCOSE 100* 90  BUN 8 7  CREATININE 0.83 0.80  CALCIUM 10.6* 10.9*   CBG (last 3)  No results for input(s): GLUCAP in the last 72 hours.  Wt Readings from Last 3 Encounters:  07/11/15 72 kg (158 lb 11.7 oz)  07/03/15 72.8 kg (160 lb 7.9 oz)  11/16/14 79.379 kg (175 lb)    Physical Exam:  Constitutional: He is oriented to person, place, and time. He appears well-developed. No distress.  Anxious appearing male  .  HENT:  Head: Normocephalic and atraumatic.  Mouth/Throat: Oropharynx is clear and moist.  Eyes: Conjunctivae and EOM are normal. Pupils are equal, round, and reactive to light.  Neck: Normal range of motion. Neck supple. No tracheal deviation present. No thyromegaly present. Trach site pink with granulation Cardiovascular: Regular rhythm. Tachycardia present.  Respiratory: Effort normal. No respiratory distress. He has decreased breath sounds in the right lower field and the left lower field. He has no wheezes.  GI: Soft. Bowel sounds are normal. He exhibits no distension. There is tenderness   Abdomen with minimal distension. Ileostomy intact--  stool present. Abdomen with granulation/graft noted, clean Musculoskeletal:  He exhibits trace RLE edema.  RLE sl tender to touch.  .  Neurological: He is alert and oriented to person, place, and time.  Able to follow basic commands without difficulty.  Motor: B/l UE 4+/5 proximal to distal RLE hip flexion 4/5, ankle dorsiflexion tr to 1/5, apf 2+/5 LLE: hip flexion 4/5, ankle dorsi/plantar flexion 4-/5  Skin: Skin is warm and dry.  Skin graft in abdomen appears intact---surrounding granulation tissue. Donor site healthy. Psychiatric: His speech is normal. His affect is blunt. He is withdrawn  Assessment/Plan: 1. Weakness, sensory loss secondary to polytrauma which require 3+ hours per day of interdisciplinary therapy in a comprehensive inpatient rehab setting. Physiatrist is providing close team supervision and 24 hour management of active medical problems listed below. Physiatrist and rehab team continue to assess barriers to discharge/monitor patient progress toward functional and medical goals.  Function:  Bathing Bathing position   Position: Wheelchair/chair at sink  Bathing parts Body parts bathed by patient: Right arm, Left arm, Chest, Front perineal area, Buttocks, Right upper leg, Left upper leg Body parts bathed by helper: Right lower leg, Left lower leg  Bathing assist Assist Level: Touching or steadying assistance(Pt > 75%)      Upper Body Dressing/Undressing Upper body dressing   What is the patient wearing?: Pull over shirt/dress     Pull over shirt/dress - Perfomed by patient: Thread/unthread right sleeve, Thread/unthread left sleeve, Put head through opening, Pull shirt over trunk          Upper body assist Assist Level: More than reasonable  time   Set up : To obtain clothing/put away  Lower Body Dressing/Undressing Lower body dressing   What is the patient wearing?: Pants, Socks, Shoes, [employer]- Performed by patient: Thread/unthread left pants leg, Pull pants up/down, Thread/unthread right pants leg Pants- Performed  by helper: Thread/unthread right pants leg   Non-skid slipper socks- Performed by helper: Don/doff right sock, Don/doff left sock     Shoes - Performed by patient: Don/doff left shoe, Fasten left Shoes - Performed by helper: Don/doff right shoe, Fasten right       TED Hose - Performed by helper: Don/doff right TED hose, Don/doff left TED hose  Lower body assist Assist for lower body dressing: Touching or steadying assistance (Pt > 75%)   Set up : To obtain clothing/put away  Toileting Toileting Toileting activity did not occur: Safety/medical concerns   Toileting steps completed by helper: Adjust clothing prior to toileting, Performs perineal hygiene, Adjust clothing after toileting    Toileting assist Assist level: More than reasonable time   Transfers Chair/bed transfer   Chair/bed transfer method: Ambulatory, Squat pivot Chair/bed transfer assist level: Touching or steadying assistance (Pt > 75%) Chair/bed transfer assistive device: Armrests, Patent attorney     Max distance: 106 Assist level: Supervision or verbal cues   Wheelchair   Type: Manual Max wheelchair distance: 150 Assist Level: No help, No cues, assistive device, takes more than reasonable amount of time  Cognition Comprehension Comprehension assist level: Understands complex 90% of the time/cues 10% of the time  Expression Expression assist level: Expresses complex 90% of the time/cues < 10% of the time  Social Interaction Social Interaction assist level: Interacts appropriately 90% of the time - Needs monitoring or encouragement for participation or interaction.  Problem Solving Problem solving assist level: Solves basic 90% of the time/requires cueing < 10% of the time  Memory Memory assist level: Recognizes or recalls 90% of the time/requires cueing < 10% of the time   Medical Problem List and Plan: 1. Functional deficits secondary to multitrauma   -continue CIR therapies    2.  RLE DVT/Anticoagulation: Pharmaceutical: Pradexa bid 3. Pain Management:  Showing improvement.  Oxycodone prn  -continue fentanyl patch, decreased to 26mcg---consider decrease to tomorrow  -increased gabapentin to  tid yesterday, consider increase to  tid tomorrow 4. Mood: LCSW to follow for evaluation and support.  5. Neuropsych: This patient is capable of making decisions on his own behalf. 6. Skin/Wound Care:  dressing changes to tid. Surgery following as well.  7. Fluids/Electrolytes/Nutrition/hyponatremia:  Serum sodium holding at 126 today. Serum osmolality slightly low, Urine sodium wnl---likely more nutritional than anything else. I personally reviewed the patient's labs today.   -Continue 1200 cc FR for the time being  -dc'ed demeclocycline, continue Na+ tabs  -added megace bid for appetite WITH GREAT RESULTS    -continue daily labs--should continue to improve with better nutrition 8. Reactive Leucocytosis: Monitor for signs of infection. Has been afebrile.   -ucx positive for ecoli-- macrobid x7 days 9. ABLA: hgb 10.6 today, due to trauma/blood loss/wounds.  10. Anxiety: ego suppport, pain control. 11. Tachycardia: Due to debility. 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 Dizziness: checking orthostatic BP.      LOS (Days) 8 A FACE TO FACE EVALUATION WAS PERFORMED  Benny Henrie T 07/11/2015 9:00 AM

## 2015-07-12 ENCOUNTER — Inpatient Hospital Stay (HOSPITAL_COMMUNITY): Payer: MEDICAID | Admitting: Physical Therapy

## 2015-07-12 ENCOUNTER — Inpatient Hospital Stay (HOSPITAL_COMMUNITY): Payer: MEDICAID | Admitting: Occupational Therapy

## 2015-07-12 ENCOUNTER — Inpatient Hospital Stay (HOSPITAL_COMMUNITY): Payer: Self-pay | Admitting: Occupational Therapy

## 2015-07-12 DIAGNOSIS — S7401XD Injury of sciatic nerve at hip and thigh level, right leg, subsequent encounter: Secondary | ICD-10-CM

## 2015-07-12 MED ORDER — OXYCODONE HCL 5 MG PO TABS
20.0000 mg | ORAL_TABLET | Freq: Four times a day (QID) | ORAL | Status: DC | PRN
  Administered 2015-07-12 – 2015-07-15 (×9): 20 mg via ORAL
  Filled 2015-07-12 (×9): qty 4

## 2015-07-12 MED ORDER — FENTANYL 25 MCG/HR TD PT72: 50.0000 ug | MEDICATED_PATCH | TRANSDERMAL | Status: DC

## 2015-07-12 MED ORDER — FENTANYL 25 MCG/HR TD PT72
50.0000 ug | MEDICATED_PATCH | TRANSDERMAL | Status: DC
  Administered 2015-07-13: 50 ug via TRANSDERMAL
  Filled 2015-07-12: qty 2

## 2015-07-12 NOTE — Progress Notes (Signed)
Physical Therapy Session Note  Patient Details  Name: Raymond Bowen MRN: [id number] Date of Birth: 08-27-1975  Today's Date: 07/12/2015 PT Individual Time: 0903-1004 and 1300-1410  PT Individual Time Calculation (min): 61 min and 70 min  Short Term Goals: Week 2:  PT Short Term Goal 1 (Week 2): =LTGs due to ELOS  Skilled Therapeutic Interventions/Progress Updates:   Session 1: Pt received resting in recliner and agreeable to therapy session.  Session focus on activity tolerance, self monitoring of HR, gait training, strengthening, and patient education.  Dynamic sitting balance and standing balance for LB and UB dressing with overall supervision.  Pt emptied ostomy bag with set up for supplies.  Sit>stand from recliner supervision and amb x50' with RW and supervision.  Pt continues to demonstrate elevated shoulders, decreased L step length, decreased weight bearing on R, and decreased weight shift to R 2/2 to pain in R foot.  Pt requesting to sit down 2/2 feeling like his heart was racing.  HR checked and found to be 120, PT encouraged patient to verbalize when he felt that his heart rate had decreased and pt did so when HR ws 108.  PT provided pt education on using his feeling of heart racing versus not racing to pace himself at home.  Pt verbalized understanding.   Pt propelled w/c remaining distance to therapy gym.  PT instructed patient in BLE therex x10 reps for heel/toe raises (no active DF on RLE 2/2 pain), LAQ, hip abd against level 2 theraband, minisquats, and standing terminal knee/hip extension against level 2 theraband.  Pt reporting feeling dizzy, BP assessed and 133/88.  Pt propelled w/c back to room and performed amb transfer to recliner with supervision. Pt positioned to comfort with call bell in reach and needs met.   Session 2: Pt received resting in recliner and agreeable to therapy session. Session focus on gait training with RW and trial with posterior leaf spring with heel lift,  LE strengthening, and pt education.  Thomas from [facility] present for first 30 minutes of session for orthotic consult.  Pt performs all transfers with supervision and no verbal cues.  Pt propels w/c to therapy gym mod I.  Pt gait trial x50' with posterior leaf spring orthotic and heel lift for improved anterior translation of tibia with RW and supervision. Pt demos improved overall gait pattern with AFO and heel lift.  Pt requested seated rest break following 50' gait trial.  PT and orthotist provided pt education on purpose of AFO/heel lift and continued increase in activity daily to optimize recovery.  Pt verbalized understanding.  Following orthotics consultation PT instructed patient in BLE x10 reps in reclined position IR/ER, and hip abd/add to midline with verbal cues for maintaining neutral rotation of LEs, and minisquats.  Pt reporting increased discomfort in abdomen following abd/add and requesting to return to sitting position. In sitting c/o dizziness, vitals assessed and WNL.  Pt continues to demonstrate good awareness of HR throughout session.  Pt transferred back to w/c at end of session, propelled self back to room mod I and performed ambulatory transfer with RW back to recliner with supervision.  Removed PLS and shoe and no evidence of redness/irritation.  Pt positioned with call bell in reach and needs met.  Therapy Documentation Precautions:  Precautions Precautions: Fall Precaution Comments: R foot pain, weakness Restrictions Weight Bearing Restrictions: No Pain: Pain Assessment Pain Assessment: 0-10 Pain Score: 10-Worst pain ever Pain Type: Acute pain Pain Location: Foot Pain Orientation: Right  Pain Descriptors / Indicators: Throbbing Pain Frequency: Constant Pain Onset: On-going Patients Stated Pain Goal: 4 Pain Intervention(s): RN made aware;Repositioned;Distraction;Emotional support   See Function Navigator for Current Functional Status.   Therapy/Group: Individual  Therapy  Earnest Conroy Penven-Crew 07/12/2015, 10:33 AM

## 2015-07-12 NOTE — Progress Notes (Signed)
Occupational Therapy Session Note  Patient Details  Name: Raymond Bowen MRN: [id number] Date of Birth: 20-Sep-1975  Today's Date: 07/12/2015 OT Individual Time: 1435-1500 OT Individual Time Calculation (min): 25 min    Short Term Goals: Week 1:  OT Short Term Goal 1 (Week 1): NA secondary to ELOS goals overall supervision to modified independent level.    Skilled Therapeutic Interventions/Progress Updates:    Pt seen for OT therapy session focusing on UE strengthening and education. Pt sitting up in recliner upon arrival, voicing increased R foot pain and fatigue. He declined any mobility at this time, agreeable to complete UE strengthening exercises from recliner. Pt already with HEP for theraband. Pt completed UE exercises using #2 dowel rod with verbal cues and demonstration for proper form and technique. Pt then completed shoulder sh. Exercises including chest press, overhead press, bicep curl, and wrist pronation/ supination. All exercises completed x2 sets of 12 reps. Rest breaks provided btwn sets. Education/ discussion regarding d/c disposition. Pt voiced feeling ready for upcoming d/c. Educated regarding energy conservation and quality vs. Quantity of exercises. During session, pt emptied own ostomy bag with set-up of gloves and towel. Pt wished to remain in recliner at end of session, left with all needs in reach.   Therapy Documentation Precautions:  Precautions Precautions: Fall Precaution Comments: R foot pain, weakness Restrictions Weight Bearing Restrictions: No Pain:   Pt voiced severe nerve pain in R LE. Rest and repositioned  See Function Navigator for Current Functional Status.   Therapy/Group: Individual Therapy  Lewis, Oniyah Rohe C 07/12/2015, 3:06 PM

## 2015-07-12 NOTE — Progress Notes (Signed)
[facility] PHYSICAL MEDICINE & REHABILITATION     PROGRESS NOTE    Subjective/Complaints: No new issues. Right foot still weak/tender---managed ostomy yesterday.   ROS: Pt denies fever, rash/itching, headache, blurred or double vision, nausea, vomiting,   diarrhea, chest pain, shortness of breath, palpitations, dysuria, dizziness,   bleeding, anxiety, or depression   Objective: Vital Signs: Blood pressure 116/79, pulse 90, temperature 97.9 F (36.6 C), temperature source Oral, resp. rate 18, height  (1.753 m), weight 72 kg (158 lb 11.7 oz), SpO2 99 %. No results found. No results for input(s): WBC, HGB, HCT, PLT in the last 72 hours.  Recent Labs  07/10/15 0527 07/11/15 0445  NA 126* 126*  K 4.3 4.5  CL 96* 101  GLUCOSE 100* 90  BUN 8 7  CREATININE 0.83 0.80  CALCIUM 10.6* 10.9*   CBG (last 3)  No results for input(s): GLUCAP in the last 72 hours.  Wt Readings from Last 3 Encounters:  07/11/15 72 kg (158 lb 11.7 oz)  07/03/15 72.8 kg (160 lb 7.9 oz)  11/16/14 79.379 kg (175 lb)    Physical Exam:  Constitutional: He is oriented to person, place, and time. He appears well-developed. No distress.  Anxious appearing male  .  HENT:  Head: Normocephalic and atraumatic.  Mouth/Throat: Oropharynx is clear and moist.  Eyes: Conjunctivae and EOM are normal. Pupils are equal, round, and reactive to light.  Neck: Normal range of motion. Neck supple. No tracheal deviation present. No thyromegaly present. Trach site pink with granulation Cardiovascular: Regular rhythm. Tachycardia present.  Respiratory: Effort normal. No respiratory distress. He has decreased breath sounds in the right lower field and the left lower field. He has no wheezes.  GI: Soft. Bowel sounds are normal. He exhibits no distension. There is tenderness   Abdomen with minimal distension. Ileostomy intact--  stool present. Abdomen with granulation/graft noted, clean Musculoskeletal: He exhibits trace  RLE edema.  RLE sl tender to touch.  .  Neurological: He is alert and oriented to person, place, and time.  Able to follow basic commands without difficulty.  Motor: B/l UE 4+/5 proximal to distal RLE hip flexion 4/5, ankle dorsiflexion tr to 1/5, apf 2+/5 LLE: hip flexion 4/5, ankle dorsi/plantar flexion 4-/5  Skin: Skin is warm and dry.  Skin graft in abdomen appears intact---surrounding granulation tissue. Donor site healthy. Psychiatric: His speech is normal. His affect is blunt. He is withdrawn  Assessment/Plan: 1. Weakness, sensory loss secondary to polytrauma which require 3+ hours per day of interdisciplinary therapy in a comprehensive inpatient rehab setting. Physiatrist is providing close team supervision and 24 hour management of active medical problems listed below. Physiatrist and rehab team continue to assess barriers to discharge/monitor patient progress toward functional and medical goals.  Function:  Bathing Bathing position   Position: Wheelchair/chair at sink  Bathing parts Body parts bathed by patient: Right arm, Left arm, Chest, Front perineal area, Buttocks, Right upper leg, Left upper leg Body parts bathed by helper: Right lower leg, Left lower leg  Bathing assist Assist Level: Touching or steadying assistance(Pt > 75%)      Upper Body Dressing/Undressing Upper body dressing   What is the patient wearing?: Pull over shirt/dress     Pull over shirt/dress - Perfomed by patient: Thread/unthread right sleeve, Thread/unthread left sleeve, Put head through opening, Pull shirt over trunk          Upper body assist Assist Level: More than reasonable time   Set up :  To obtain clothing/put away  Lower Body Dressing/Undressing Lower body dressing   What is the patient wearing?: Pants, Socks, Shoes, [employer] Hose     Pants- Performed by patient: Thread/unthread left pants leg, Pull pants up/down, Thread/unthread right pants leg Pants- Performed by helper:  Thread/unthread right pants leg   Non-skid slipper socks- Performed by helper: Don/doff right sock, Don/doff left sock     Shoes - Performed by patient: Don/doff left shoe, Fasten left Shoes - Performed by helper: Don/doff right shoe, Fasten right       TED Hose - Performed by helper: Don/doff right TED hose, Don/doff left TED hose  Lower body assist Assist for lower body dressing: Touching or steadying assistance (Pt > 75%)   Set up : To obtain clothing/put away  Toileting Toileting Toileting activity did not occur: Safety/medical concerns   Toileting steps completed by helper: Adjust clothing prior to toileting, Performs perineal hygiene, Adjust clothing after toileting    Toileting assist Assist level: More than reasonable time   Transfers Chair/bed transfer   Chair/bed transfer method: Ambulatory, Stand pivot Chair/bed transfer assist level: Touching or steadying assistance (Pt > 75%) Chair/bed transfer assistive device: Armrests, Patent attorneyWalker     Locomotion Ambulation     Max distance: 25 x 2 Assist level: Touching or steadying assistance (Pt > 75%)   Wheelchair   Type: Manual Max wheelchair distance: 150 Assist Level: No help, No cues, assistive device, takes more than reasonable amount of time  Cognition Comprehension Comprehension assist level: Follows complex conversation/direction with extra time/assistive device  Expression Expression assist level: Expresses complex ideas: With extra time/assistive device  Social Interaction Social Interaction assist level: Interacts appropriately with others with medication or extra time (anti-anxiety, antidepressant).  Problem Solving Problem solving assist level: Solves basic 90% of the time/requires cueing < 10% of the time  Memory Memory assist level: Recognizes or recalls 90% of the time/requires cueing < 10% of the time   Medical Problem List and Plan: 1. Functional deficits secondary to multitrauma   -continue CIR  therapies   -afo ordered per [facility]  2. RLE DVT/Anticoagulation: Pharmaceutical: Pradexa bid 3. Pain Management:  Showing improvement.  Oxycodone prn  -continue fentanyl patch, decreased to 75mcg---  decrease to 50mcg tomorrow  -increase gabapentin to 300mg  tid   4. Mood: LCSW to follow for evaluation and support.  5. Neuropsych: This patient is capable of making decisions on his own behalf. 6. Skin/Wound Care:  dressing changes to tid. Surgery following as well.  7. Fluids/Electrolytes/Nutrition/hyponatremia:  Serum sodium holding at 126 today. Serum osmolality slightly low, Urine sodium wnl---likely more nutritional than anything else. I personally reviewed the patient's labs today.   -Continue 1200 cc FR for the time being  -dc'ed demeclocycline, continue Na+ tabs  -added megace bid for appetite WITH GREAT RESULTS    -continue daily labs--should continue to improve with better nutrition 8. Reactive Leucocytosis: Monitor for signs of infection. Has been afebrile.   -ucx positive for ecoli-- macrobid x7 days 9. ABLA: hgb 10.6 today, due to trauma/blood loss/wounds.  10. Anxiety: ego suppport, pain control. 11. Tachycardia: Due to debility. 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

## 2015-07-12 NOTE — Progress Notes (Signed)
Occupational Therapy Session Note  Patient Details  Name: Raymond Bowen MRN: [id number] Date of Birth: February 25, 1976  Today's Date: 07/12/2015 OT Individual Time: 1045-1200 OT Individual Time Calculation (min): 75 min    Short Term Goals: Week 1:  OT Short Term Goal 1 (Week 1): NA secondary to ELOS goals overall supervision to modified independent level.    Skilled Therapeutic Interventions/Progress Updates:  Upon entering the room, pt seated in recliner chair with R LE elevated. Pt with 10/10 c/o pain in R foot this session. Pt reports recently taking pain medication and declines self care, transfers, sit <>stand, and functional ambulation. Pt agreeable to addressing B UE strengthening HEP this session. Pt provided with written handout regarding all covered exercises. OT provided description and demonstration of each exercise. Pt returning exercises with use of green, level 3 resistance for shoulder flexion, shoulder diagonals, horizontal abduction, bicep curl, and alternating punches x 2 sets of 10 with rest breaks as needed secondary to fatigue. Pt needing min verbal cues for proper technique. Pt remained seated in recliner chair with call bell and all needed items within reach.   Therapy Documentation Precautions:  Precautions Precautions: Fall Precaution Comments: R foot pain, weakness Restrictions Weight Bearing Restrictions: No Pain: Pain Assessment Pain Assessment: 0-10 Pain Score: 10-Worst pain ever Pain Location: Foot Pain Orientation: Right Pain Intervention(s): RN made aware;Repositioned;Distraction;Emotional support  See Function Navigator for Current Functional Status.   Therapy/Group: Individual Therapy  Lowella Gripittman, Martez Weiand L 07/12/2015, 12:50 PM

## 2015-07-12 NOTE — Progress Notes (Signed)
Recreational Therapy Session Note  Patient Details  Name: Raymond Bowen MRN: [id number] Date of Birth: 05/05/1976 Today's Date: 07/12/2015 LATE ENTRY for 07/11/15 Pain: c/o of abdominal pain, RN aware, premedicated  Skilled Therapeutic Interventions/Progress Updates: Met with pt to discuss use of leisure time post discharge including activity modifications & psychosocial support.  Specific focus on adaptations to activities involving his youngest children.  Pt appreciative of visit and anxious for discharge out of the hospital.  No further TR implemented.  Therapy/Group: Individual Therapy   Crystal Scarberry 07/12/2015, 8:55 AM

## 2015-07-12 NOTE — Consult Note (Signed)
WOC nurse visited with patient in his room today to determine patient's current educational needs regarding his ostomy care. He reports he has been emptying the pouch himself.  He reports his sister will perform the pouch changes "she is a Engineer, civil (consulting)nurse".  I will meet with his sister on Friday while she is here with the patient to go over the steps of the pouch change.  I have enrolled the patient in [employer] which provides a few sample of ostomy pouches to the patient and connects the patient and the family with [facility].  I discussed with the patient today about obtained pouches after DC. He is currently without insurance and per the patient his mother has applied for [employer] [facility].  I provided the patient with information about [employer] Customer [employer] program and that he must contact them directly to get enrolled in this program.  We decided his mother will have all his information that [facility] will need (financial) and that he is not able to make this call from his hospital room. He gave me permission to contact his mother Harriett Sineancy and provided me with her number.  I have contacted Harriett Sineancy and provided the needed contact information for the program and explained that she will need to contact them prior to his DC to make sure he has adequate ostomy pouch supplies at home.  She states she will follow up.  She will also be present for the teaching session on Friday.  Davina PokeM. Jacky Hartung RN,CWOCN [phone]
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
## 2015-07-12 NOTE — Progress Notes (Signed)
Social Work Patient ID: Raymond Bowen, male   DOB: Feb 19, 1976, 39 y.o.   MRN: [id number]   Have reviewed team conference with pt and will meet with sister when she is here on Friday.  Pt pleased with gains this week and feeling he will be ready for d/c on Saturday.  Have reviewed with pt that I have secured a HH agency to provide RN visits at sister's home.  Still working on securing a local medical clinic who will serve as his primary care in [place].  [facility] of [facility].  Continue to work on d/c needs.  Amily Depp, LCSW

## 2015-07-13 ENCOUNTER — Inpatient Hospital Stay (HOSPITAL_COMMUNITY): Payer: MEDICAID | Admitting: Physical Therapy

## 2015-07-13 ENCOUNTER — Inpatient Hospital Stay (HOSPITAL_COMMUNITY): Payer: MEDICAID | Admitting: Occupational Therapy

## 2015-07-13 DIAGNOSIS — L8990 Pressure ulcer of unspecified site, unspecified stage: Secondary | ICD-10-CM

## 2015-07-13 LAB — BASIC METABOLIC PANEL
Anion gap: 7 (ref 5–15)
BUN: <5 mg/dL — ABNORMAL LOW (ref 6–20)
CO2: 19 mmol/L — ABNORMAL LOW (ref 22–32)
Calcium: 10.7 mg/dL — ABNORMAL HIGH (ref 8.9–10.3)
Chloride: 103 mmol/L (ref 101–111)
Creatinine, Ser: 0.71 mg/dL (ref 0.61–1.24)
GFR calc Af Amer: >60 mL/min (ref >60)
GFR calc non Af Amer: >60 mL/min (ref >60)
Glucose, Bld: 101 mg/dL — ABNORMAL HIGH (ref 65–99)
Potassium: 3.4 mmol/L — ABNORMAL LOW (ref 3.5–5.1)
Sodium: 129 mmol/L — ABNORMAL LOW (ref 135–145)

## 2015-07-13 MED ORDER — GABAPENTIN 300 MG PO CAPS
300.0000 mg | ORAL_CAPSULE | Freq: Three times a day (TID) | ORAL | Status: DC
  Administered 2015-07-13 – 2015-07-15 (×6): 300 mg via ORAL
  Filled 2015-07-13 (×6): qty 1

## 2015-07-13 NOTE — Progress Notes (Signed)
Physical Therapy Session Note  Patient Details  Name: Raymond Bowen MRN: [id number] Date of Birth: 08/13/1975  Today's Date: 07/13/2015 PT Individual Time: 1305-1330 PT Individual Time Calculation (min): 25 min   Short Term Goals: Week 2:  PT Short Term Goal 1 (Week 2): =LTGs due to ELOS  Skilled Therapeutic Interventions/Progress Updates:   Session focused on transfers, wheelchair management, strengthening, and activity tolerance. Patient sitting in recliner with BLE elevated reporting 9/10 R foot pain. Patient with RW delivered in room, adjusted height and patient ambulated 2 x ~ 5 ft using RW from recliner <> wheelchair with mod I. Patient donned and doffed R elevating leg rest and regular L leg rest with setup assist and supervision cues. Patient propelled wheelchair using BUE to/from gym with mod I. Performed squat pivot transfers to/from NuStep with mod I and performed NuStep using BUE/BLE at level 3 x 10 min. Patient returned to room and left sitting in recliner with all needs within reach.   Therapy Documentation Precautions:  Precautions Precautions: Fall Precaution Comments: R foot pain, weakness Restrictions Weight Bearing Restrictions: No Pain: Pain Assessment Pain Assessment: 0-10 Pain Score: 9  Pain Type: Acute pain Pain Location: Foot Pain Orientation: Right Pain Descriptors / Indicators: Aching Pain Frequency: Constant Pain Onset: On-going Patients Stated Pain Goal: 3 Pain Intervention(s): Emotional support (premedicated) Multiple Pain Sites: No   See Function Navigator for Current Functional Status.   Therapy/Group: Individual Therapy  Kerney ElbeVarner, Raymond Bowen 07/13/2015, 1:21 PM

## 2015-07-13 NOTE — Progress Notes (Signed)
Ostomy leaking. 1 piece removed, site cleaned, prepped and new wafer with bag placed.  Will continue to monitor site

## 2015-07-13 NOTE — Progress Notes (Signed)
Occupational Therapy Session Note  Patient Details  Name: Raymond Bowen MRN: [id number] Date of Birth: 1976/07/08  Today's Date: 07/13/2015 OT Individual Time: 1000-1100 OT Individual Time Calculation (min): 60 min    Short Term Goals: Week 1:  OT Short Term Goal 1 (Week 1): NA secondary to ELOS goals overall supervision to modified independent level.    Skilled Therapeutic Interventions/Progress Updates:  Upon entering the room, pt supine in bed with 8/10 c/o pain in R foot this session. As pt performs bed mobility OT notices ostomy bag is leaking. RN notified and changed during this session. OT providing education while RN performing task. Pt performed squat pivot transfer from bed to chair for bathing at sink. Pt performed bathing with mostly supervision and one time needed steady assist when standing to wash buttocks. Pt utilized long handled sponge in order to increase I with LB bathing. He also utilize reacher and shoe horn in order to don R shoe with increased independence. Pt needing min verbal cues for proper technique. Pt transferred to recliner chair at end of session with B LEs elevated and call bell within reach upon exiting the room.   Therapy Documentation Precautions:  Precautions Precautions: Fall Precaution Comments: R foot pain, weakness Restrictions Weight Bearing Restrictions: No General:   Vital Signs:   Pain: Pain Assessment Pain Assessment: 0-10 Pain Score: 8  Pain Type: Acute pain Pain Location: Foot Pain Orientation: Right Pain Descriptors / Indicators: Aching Pain Frequency: Constant Pain Onset: On-going Patients Stated Pain Goal: 3 Pain Intervention(s): Medication (See eMAR) Multiple Pain Sites: No  See Function Navigator for Current Functional Status.   Therapy/Group: Individual Therapy  Lowella Gripittman, Torres Hardenbrook L 07/13/2015, 12:35 PM

## 2015-07-13 NOTE — Progress Notes (Signed)
Physical Therapy Session Note  Patient Details  Name: Raymond Bowen MRN: [id number] Date of Birth: 1975/09/03  Today's Date: 07/13/2015 PT Individual Time: 1130-1200 and 1445-1600 PT Individual Time Calculation (min): 30 min and 75 min   Short Term Goals: Week 2:  PT Short Term Goal 1 (Week 2): =LTGs due to ELOS  Skilled Therapeutic Interventions/Progress Updates:    Session 1: Pt received resting in recliner, reports increase in RLE since attempting to don R shoe with OT earlier.  Pt agreeable to bedside therex and pt education, given short session time.  PT instructed pt in HEP provided to pt yesterday. Pt performed BLE therex x10 reps heel/toe raises (no active DF on RLE), LAQ, hip abd against level 2 theraband isolating RLE and LLE, hamstring pulls with level 2 theraband, and minisquats.  Pt demos improved equal weight bearing through LEs today in standing during therex.  PT described modifications that could be made (i.e performing exercises in sitting rather than standing), and how to progress difficulty as program became less challenging.  Pt verbalized understanding.  PT demonstrated R heel cord stretch 2x30 using level 2 theraband and pt returned demonstration.  Pt positioned in recliner with LEs elevated at end of session with call bell in reach and needs met.   Session 2: Pt received resting in recliner, reporting ongoing pain in RLE but agreeable to therapy session. Sit>stand mod I with RW and amb transfer to w/c mod I.  Pt self propelled w/c to therapy gym. PT instructed patient in stair negotiation x8 steps with 2 hand rails forward ascent/descent with close supervision and verbal cues for sequencing of LE advancement.  Pt with increasing fatigue after 5 steps and progressed to requiring intermittent steady assist for RLE stability.  After prolonged rest break pt agreeable to 10 m walk test.  Pt amb 70mwithout PLS and heel wedge with gait speed of .35 m/s indicating pt is at an  increased risk of falls. PT provided pt education on trialing 10 m walk again with PLS and heel wedge in next session.  Pt performed 15 min on UEB on hills mode level 10 with no rest breaks for UE strengthening and overall cardiopulmonary endurance.  Pt HR at completion of exercise 115 fade to 105 with 5 min rest break.  Pt self propelled w/c back to room and performed mod I transfer back to recliner.  Pt positioned with call bell in reach and needs met.   Therapy Documentation Precautions:  Precautions Precautions: Fall Precaution Comments: R foot pain, weakness Restrictions Weight Bearing Restrictions: No Pain: Pain Assessment Pain Assessment: 0-10 Pain Score: 7  Pain Type: Acute pain Pain Location: Foot Pain Orientation: Right Pain Intervention(s): RN made aware;Repositioned;Emotional support Multiple Pain Sites: No   See Function Navigator for Current Functional Status.   Therapy/Group: Individual Therapy  CEarnest ConroyPenven-Crew 07/13/2015, 12:38 PM

## 2015-07-13 NOTE — Progress Notes (Signed)
Pt. Call RN to the room to tell her that the dressing on left thigh came off by accident when he was removing his pants.Adaptive dressing,gauze and tape was placed on.
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
## 2015-07-13 NOTE — Plan of Care (Signed)
Problem: RH BOWEL ELIMINATION Goal: RH STG MANAGE BOWEL WITH ASSISTANCE STG Manage ostomy with mod I Assistance.  Outcome: Progressing Patient emptying ostomy bag with assistance.

## 2015-07-14 ENCOUNTER — Inpatient Hospital Stay (HOSPITAL_COMMUNITY): Payer: MEDICAID | Admitting: Physical Therapy

## 2015-07-14 ENCOUNTER — Inpatient Hospital Stay (HOSPITAL_COMMUNITY): Payer: Self-pay

## 2015-07-14 DIAGNOSIS — G8918 Other acute postprocedural pain: Secondary | ICD-10-CM

## 2015-07-14 DIAGNOSIS — D72829 Elevated white blood cell count, unspecified: Secondary | ICD-10-CM

## 2015-07-14 LAB — CBC
HCT: 28.8 % — ABNORMAL LOW (ref 39.0–52.0)
Hemoglobin: 9.4 g/dL — ABNORMAL LOW (ref 13.0–17.0)
MCH: 27.7 pg (ref 26.0–34.0)
MCHC: 32.6 g/dL (ref 30.0–36.0)
MCV: 85 fL (ref 78.0–100.0)
Platelets: 414 10*3/uL — ABNORMAL HIGH (ref 150–400)
RBC: 3.39 MIL/uL — ABNORMAL LOW (ref 4.22–5.81)
RDW: 16.6 % — ABNORMAL HIGH (ref 11.5–15.5)
WBC: 20.4 10*3/uL — ABNORMAL HIGH (ref 4.0–10.5)

## 2015-07-14 LAB — BASIC METABOLIC PANEL WITH GFR
Anion gap: 9 (ref 5–15)
BUN: <5 mg/dL — ABNORMAL LOW (ref 6–20)
CO2: 18 mmol/L — ABNORMAL LOW (ref 22–32)
Calcium: 11 mg/dL — ABNORMAL HIGH (ref 8.9–10.3)
Chloride: 102 mmol/L (ref 101–111)
Creatinine, Ser: 0.74 mg/dL (ref 0.61–1.24)
GFR calc Af Amer: >60 mL/min
GFR calc non Af Amer: >60 mL/min
Glucose, Bld: 105 mg/dL — ABNORMAL HIGH (ref 65–99)
Potassium: 3.5 mmol/L (ref 3.5–5.1)
Sodium: 129 mmol/L — ABNORMAL LOW (ref 135–145)

## 2015-07-14 LAB — URINALYSIS, ROUTINE W REFLEX MICROSCOPIC
Bilirubin Urine: NEGATIVE
Glucose, UA: NEGATIVE mg/dL
Ketones, ur: NEGATIVE mg/dL
Leukocytes, UA: NEGATIVE
Nitrite: NEGATIVE
Protein, ur: 30 mg/dL — AB
Specific Gravity, Urine: 1.012 (ref 1.005–1.030)
pH: 6.5 (ref 5.0–8.0)

## 2015-07-14 LAB — URINE MICROSCOPIC-ADD ON

## 2015-07-14 MED ORDER — TRAMADOL HCL 50 MG PO TABS: 50.0000 mg | ORAL_TABLET | Freq: Four times a day (QID) | ORAL | Status: DC

## 2015-07-14 MED ORDER — FENTANYL 25 MCG/HR TD PT72
25.0000 ug | MEDICATED_PATCH | TRANSDERMAL | Status: DC
Start: 2015-07-28 — End: 2015-07-29

## 2015-07-14 MED ORDER — TRAZODONE HCL 50 MG PO TABS: 50.0000 mg | ORAL_TABLET | Freq: Every day | ORAL | Status: DC

## 2015-07-14 MED ORDER — GUAIFENESIN ER 600 MG PO TB12: 1200.0000 mg | ORAL_TABLET | Freq: Two times a day (BID) | ORAL | Status: DC | PRN

## 2015-07-14 MED ORDER — BACITRACIN ZINC 500 UNIT/GM EX OINT
TOPICAL_OINTMENT | Freq: Every day | CUTANEOUS | Status: DC
  Filled 2015-07-14: qty 28.35

## 2015-07-14 MED ORDER — ONDANSETRON HCL 4 MG PO TABS: 4.0000 mg | ORAL_TABLET | Freq: Three times a day (TID) | ORAL | Status: DC

## 2015-07-14 MED ORDER — FENTANYL 50 MCG/HR TD PT72: 50.0000 ug | MEDICATED_PATCH | TRANSDERMAL | Status: DC

## 2015-07-14 MED ORDER — BACITRACIN ZINC 500 UNIT/GM EX OINT: TOPICAL_OINTMENT | Freq: Every day | CUTANEOUS | Status: DC

## 2015-07-14 MED ORDER — GABAPENTIN 300 MG PO CAPS: 300.0000 mg | ORAL_CAPSULE | Freq: Three times a day (TID) | ORAL | Status: DC

## 2015-07-14 MED ORDER — SIMETHICONE 40 MG/0.6ML PO SUSP: 40.0000 mg | Freq: Four times a day (QID) | ORAL | Status: DC

## 2015-07-14 MED ORDER — BACITRACIN ZINC 500 UNIT/GM EX OINT: TOPICAL_OINTMENT | Freq: Two times a day (BID) | CUTANEOUS | Status: DC

## 2015-07-14 MED ORDER — OXYCODONE HCL 20 MG PO TABS: 10.0000 mg | ORAL_TABLET | Freq: Four times a day (QID) | ORAL | Status: DC | PRN

## 2015-07-14 MED ORDER — DABIGATRAN ETEXILATE MESYLATE 150 MG PO CAPS: 150.0000 mg | ORAL_CAPSULE | Freq: Two times a day (BID) | ORAL | Status: DC

## 2015-07-14 MED ORDER — LORAZEPAM 1 MG PO TABS: 1.0000 mg | ORAL_TABLET | Freq: Two times a day (BID) | ORAL | Status: DC | PRN

## 2015-07-14 NOTE — Discharge Instructions (Signed)
Inpatient Rehab Discharge Instructions  Raymond LemmingsBrian K Bowen Discharge date and time: 07/15/15   Activities/Precautions/ Functional Status: Activity: activity as tolerated Diet: regular diet Wound Care: Wet-to-dry now BID over beefy red granulation tissue in abdominal wound.  Do not cover adaptic with wet gauze (we want it to dry out).  Change adaptic once daily.  Cover wound after packed with ABD pad. Apply bacitracin to right abdomen at ostomy wager and cover with dry dressing.    Functional status:  ___ No restrictions     ___ Walk up steps independently _X__ 24/7 supervision/assistance   ___ Walk up steps with assistance ___ Intermittent supervision/assistance  ___ Bathe/dress independently ___ Walk with walker     __X_ Bathe/dress with assistance ___ Walk Independently    ___ Shower independently ___ Walk with assistance    ___ Shower with assistance _X__ No alcohol     ___ Return to work/school ________    COMMUNITY REFERRALS UPON DISCHARGE:    Home Health:   PT                       Agency:  [facility] Phone: [phone]    Medical Equipment/Items Ordered:  Rolling walker, bedside commode                                                      Agency/Supplier:  [facility] @ [phone]    Special Instructions:    My questions have been answered and I understand these instructions. I will adhere to these goals and the provided educational materials after my discharge from the hospital.  Patient/Caregiver Signature _______________________________ Date __________  Clinician Signature _______________________________________ Date __________  Please bring this form and your medication list with you to all your follow-up doctor's appointments.

## 2015-07-14 NOTE — Progress Notes (Addendum)
Physical Therapy Discharge Summary  Patient Details  Name: Raymond Bowen MRN: [id number] Date of Birth: 1975/10/03  Today's Date: 07/14/2015 PT Individual Time: 1103-1203 and  0254-2706 PT Individual Time Calculation (min): 60 min and 50 min    Patient has met 9 of 9 long term goals due to improved activity tolerance, improved balance and increased strength.  Patient to discharge at an ambulatory level Supervision.   Patient's care partner is independent to provide the necessary supervision at discharge.   Recommendation:  Patient will benefit from ongoing skilled PT services in home health setting to continue to advance safe functional mobility, address ongoing impairments in ROM, strength, balance, flexibility, and safety awareness, and minimize fall risk.  Equipment: RW, R AFO, R heel lift  Reasons for discharge: treatment goals met  Patient/family agrees with progress made and goals achieved: Yes   Skilled Therapeutic Intervention:  Session 1: Pt received resting in w/c with family present, c/o shoe not fitting with AFO, heel lift, and insert due to swelling.  Family left room at this time to meet someone out front and did not return for remainder of session.  PT adjusted shoe trialing 1 heel lift, no heel lift, and ultimately pt only able to tolerate shoe with AFO and no insert to protect bottom of foot.  PT provided pt education on high risk of skin break down if he had no barrier between the brace and the bottom of his foot; pt continued to state that there was no way he could tolerate the AFO and the insert in the shoe.  PT recommended pt take AFO to shoe store at d/c and find a shoe that was large enough to accommodate the orthotics, the shoe insert, and the swelling in his LE.  Pt self propelled w/c to therapy gym for energy conservation and time management.  PT instructed patient in negotiation of 4 steps with 2 rails and supervision providing education for sequencing and how to  instruct family in assisting him with stair negotiation.  Pt verbalized understanding and PT noted pt to have increased respiratory rate following 4 steps.  Following seated rest break PT instructed patient in 10 m walk test with AFO with gait speed increasing from 0.35 in yesterday's trial (without AFO) to 0.43 today (with AFO).  Shoe and AFO removed at this point to assess for skin integrity with no redness noted, but pt reporting tenderness to palpation along met-heads.  Pt returned to room in w/c at end of session and family present in room.  PT explained purpose of them being here was for family education and asked them to be present for PM session for education in providing supervision for pt with ambulation, stair negotiation, and car transfers.  Also explained solution for AFO being too tight in his current shoe and family verbalized understanding of all.  Pt left in w/c with R ELR elevated for edema control and call bell in reach.    Session 2: Pt received resting in w/c with family present and agreeable to therapy session.  Pt's sister accompanied pt for family ed in gait with RW, car transfers, and stair negotiation.  PT provided further family education in HEP, activity tolerance, and use of AFO when swelling allowed.  Pt able to perform car transfer with mod I and RW, gait with RW x126' and supervision with encouragement to increase distance and 1 ant LOB which patient was able to self correct.  Pt negotiated 4 steps with 2 hand  rails with sister providing appropriate supervision and cues.  Pt performed 10 min on arm bike at constant work level (10 watts) with increase in HR to 130 requesting to stop.  Prolonged rest break for HR to decrease and ongoing family education about progress with therapy and continuing HEP, etc at home.  Pt returned to room in w/c at end of session and performed mod I squat/pivot w/c>recliner. Pt positioned with LEs elevated, call bell in reach, and needs met.   PT  Discharge Precautions/Restrictions Precautions Precautions: Fall Precaution Comments: R foot drop and high pain levels Pain Pain Assessment Pain Assessment: 0-10 Pain Score: 0-No pain Pain Location: Foot Pain Orientation: Right Pain Intervention(s): RN made aware;Repositioned;Emotional support  Cognition Overall Cognitive Status: Within Functional Limits for tasks assessed Arousal/Alertness: Awake/alert Orientation Level: Oriented X4 Attention: Selective Sustained Attention: Appears intact Selective Attention: Appears intact Memory: Appears intact Awareness: Appears intact Problem Solving: Appears intact Safety/Judgment: Appears intact Sensation Sensation Light Touch: Appears Intact, decreased on R L4-S1 dermatome compared to L Coordination Gross Motor Movements are Fluid and Coordinated: Yes Fine Motor Movements are Fluid and Coordinated: Yes Motor    R foot drop Mobility Transfers Transfers: Yes Sit to Stand: 6: Modified independent (Device/Increase time) Stand to Sit: 6: Modified independent (Device/Increase time) Stand Pivot Transfers: 6: Modified independent (Device/Increase time) Locomotion  Ambulation Ambulation: Yes Ambulation/Gait Assistance: 5: Supervision Ambulation Distance (Feet): 126 Feet Assistive device: Rolling walker Ambulation/Gait Assistance Details: Verbal cues for gait pattern;Verbal cues for safe use of DME/AE Ambulation/Gait Assistance Details: ongoing R foot drop without AFO Gait Gait: Yes Gait Pattern: Impaired Gait Pattern: Step-to pattern;Decreased dorsiflexion - right;Decreased stance time - right;Decreased weight shift to right;Right hip hike Gait velocity: 0.43 m/s with posterior leaf spring AFO and heel lift insert Stairs / Additional Locomotion Stairs: Yes Stairs Assistance: 5: Supervision Stairs Assistance Details: Verbal cues for technique;Verbal cues for sequencing;Verbal cues for precautions/safety Stair Management Technique:  Two rails Number of Stairs: 4 Wheelchair Mobility Wheelchair Mobility: Yes Wheelchair Assistance: 6: Modified independent (Device/Increase time) Environmental health practitioner: Both upper extremities Wheelchair Parts Management: Independent Distance: 150  Trunk/Postural Assessment  Cervical Assessment Cervical Assessment: Within Functional Limits Thoracic Assessment Thoracic Assessment: Within Functional Limits Lumbar Assessment Lumbar Assessment: Within Functional Limits Postural Control Postural Control: Deficits on evaluation (decreased balance strategies 2/2 RLE nerve injury)  Balance Balance Balance Assessed: Yes Dynamic Sitting Balance Dynamic Sitting - Balance Support: No upper extremity supported Dynamic Sitting - Level of Assistance: 6: Modified independent (Device/Increase time) Sitting balance - Comments: donning shoes Static Standing Balance Static Standing - Balance Support: Right upper extremity supported;Left upper extremity supported;Bilateral upper extremity supported Static Standing - Level of Assistance: 6: Modified independent (Device/Increase time) Dynamic Standing Balance Dynamic Standing - Balance Support: Right upper extremity supported;Left upper extremity supported;During functional activity Dynamic Standing - Level of Assistance: 6: Modified independent (Device/Increase time) (pulling up pants) Extremity Assessment  RLE Strength RLE Overall Strength Comments: hip flexion 4/5, knee extension 5/5, knee flexion 3+/5, DF trace, PF 2/5 LLE Assessment LLE Assessment: Within Functional Limits LLE Strength LLE Overall Strength Comments: hip flexion 4+/5, knee extension 5/5, knee flexion 4+/5, DF/PF 4+/5   See Function Navigator for Current Functional Status.  Nanci Lakatos E Penven-Crew 07/14/2015, 3:14 PM

## 2015-07-14 NOTE — Progress Notes (Signed)
Occupational Therapy Session Note  Patient Details  Name: Raymond LemmingsBrian K Bowen MRN: [id number] Date of Birth: [birth year]  Today's Date: 07/14/2015 OT Individual Time: [id number] OT Individual Time Calculation (min): 60 min  and Today's Date: 07/14/2015 OT Missed Time: 15 Minutes Missed Time Reason: Patient fatigue;Pain   Short Term Goals: Week 1:  OT Short Term Goal 1 (Week 1): NA secondary to ELOS goals overall supervision to modified independent level.    Skilled Therapeutic Interventions/Progress Updates:    Pt resting in bed upon arrival with family present.  Pt engaged in BADL retraining with focus on performing all bathing and dressing tasks without assistance.  Pt demonstrated proficiency in emptying ostomy pouch and appropriately requested necessary supplies.  Pt completed bathing and dressing tasks with sit<>stand from w/c at sink with supervision.  Continued discharge planning with patient and mother.  Pt pleased with progress and "ready" for discharge.  Therapy Documentation Precautions:  Precautions Precautions: Fall Precaution Comments: R foot pain, weakness Restrictions Weight Bearing Restrictions: No General: General OT Amount of Missed Time: 15 Minutes   Pain: Pain Assessment Pain Score: 7   Repositioned  See Function Navigator for Current Functional Status.   Therapy/Group: Individual Therapy  Rich BraveLanier, Ahlaya Ende Chappell 07/14/2015, 10:02 AM

## 2015-07-14 NOTE — Progress Notes (Signed)
[facility] PHYSICAL MEDICINE & REHABILITATION     PROGRESS NOTE    Subjective/Complaints: Pt seen this AM with family in room.  He does not have any complaints this AM and is looking forward to going home tomorrow.   ROS: Denies CP, SOB, n/v/d.   Objective: Vital Signs: Blood pressure 121/68, pulse 95, temperature 98 F (36.7 C), temperature source Oral, resp. rate 18, height 5\' 9"  (1.753 m), weight 71.442 kg (157 lb 8 oz), SpO2 100 %. Dg Chest 2 View  07/14/2015  CLINICAL DATA:  Elevated white blood cell count EXAM: CHEST  2 VIEW COMPARISON:  03/22/2013 FINDINGS: The heart size and mediastinal contours are within normal limits. Both lungs are clear. The visualized skeletal structures are unremarkable. IMPRESSION: No active cardiopulmonary disease. Electronically Signed   By: Charlett NoseKevin  Dover M.D.   On: 07/14/2015 10:16    Recent Labs  07/14/15 0740  WBC 20.4*  HGB 9.4*  HCT 28.8*  PLT 414*    Recent Labs  07/13/15 0648 07/14/15 0740  NA 129* 129*  K 3.4* 3.5  CL 103 102  GLUCOSE 101* 105*  BUN <5* <5*  CREATININE 0.71 0.74  CALCIUM 10.7* 11.0*   CBG (last 3)  No results for input(s): GLUCAP in the last 72 hours.  Wt Readings from Last 3 Encounters:  07/13/15 71.442 kg (157 lb 8 oz)  07/03/15 72.8 kg (160 lb 7.9 oz)  11/16/14 79.379 kg (175 lb)   Physical Exam:  Constitutional: He appears well-developed. No distress. Vital signs reviewed.  HENT: Normocephalic and atraumatic.  Mouth/Throat: Oropharynx is clear and moist.  Eyes: Conjunctivae and EOM are normal.  Neck: No tracheal deviation present. No thyromegaly present. +Trach site  Cardiovascular: Regular rate rhythm.   Respiratory: Effort normal. No respiratory distress. He has decreased breath sounds at bases. He has no wheezes.  GI: Soft. Bowel sounds are normal. He exhibits no distension. There is tenderness   Abdomen with minimal distension. Ileostomy intact.  Musculoskeletal: He exhibits trace RLE edema.   No TTP.  Neurological: He is alert and oriented.  Able to follow basic commands without difficulty.  Motor: B/l UE 5/5 proximal to distal RLE hip flexion 4+/5, ankle dorsiflexion 1/5, apf 2/5 LLE: hip flexion 4+/5, ankle dorsi/plantar flexion 4+/5  Skin: Skin is warm and dry.  Skin graft in abdomen appears intact---surrounding granulation tissue.  Psychiatric: His speech is normal. Behavior normal.  Assessment/Plan: 1. Weakness, sensory loss secondary to polytrauma which require 3+ hours per day of interdisciplinary therapy in a comprehensive inpatient rehab setting. Physiatrist is providing close team supervision and 24 hour management of active medical problems listed below. Physiatrist and rehab team continue to assess barriers to discharge/monitor patient progress toward functional and medical goals.  Function:  Bathing Bathing position   Position: Wheelchair/chair at sink  Bathing parts Body parts bathed by patient: Right arm, Left arm, Chest, Front perineal area, Buttocks, Right upper leg, Left upper leg, Right lower leg, Left lower leg Body parts bathed by helper: Right lower leg, Left lower leg  Bathing assist Assist Level: Supervision or verbal cues      Upper Body Dressing/Undressing Upper body dressing   What is the patient wearing?: Pull over shirt/dress     Pull over shirt/dress - Perfomed by patient: Thread/unthread right sleeve, Thread/unthread left sleeve, Put head through opening, Pull shirt over trunk          Upper body assist Assist Level: No help, No cues   Set  up : To obtain clothing/put away  Lower Body Dressing/Undressing Lower body dressing   What is the patient wearing?: Pants, Ted Hose, Shoes     Pants- Performed by patient: Thread/unthread left pants leg, Pull pants up/down, Thread/unthread right pants leg Pants- Performed by helper: Thread/unthread right pants leg   Non-skid slipper socks- Performed by helper: Don/doff right sock, Don/doff  left sock     Shoes - Performed by patient: Don/doff left shoe, Fasten left, Don/doff right shoe, Fasten right Shoes - Performed by helper: Don/doff right shoe, Fasten right       TED Hose - Performed by helper: Don/doff right TED hose, Don/doff left TED hose  Lower body assist Assist for lower body dressing: Set up   Set up : To obtain clothing/put away  Toileting Toileting Toileting activity did not occur: Safety/medical concerns   Toileting steps completed by helper: Adjust clothing prior to toileting, Performs perineal hygiene, Adjust clothing after toileting    Toileting assist Assist level: More than reasonable time   Transfers Chair/bed transfer   Chair/bed transfer method: Ambulatory Chair/bed transfer assist level: No Help, no cues, assistive device, takes more than a reasonable amount of time Chair/bed transfer assistive device: Armrests, Patent attorney     Max distance: 33 Assist level: Supervision or verbal cues   Wheelchair   Type: Manual Max wheelchair distance: 150 Assist Level: No help, No cues, assistive device, takes more than reasonable amount of time  Cognition Comprehension Comprehension assist level: Follows complex conversation/direction with extra time/assistive device  Expression Expression assist level: Expresses complex ideas: With extra time/assistive device  Social Interaction Social Interaction assist level: Interacts appropriately with others with medication or extra time (anti-anxiety, antidepressant).  Problem Solving Problem solving assist level: Solves complex 90% of the time/cues < 10% of the time  Memory Memory assist level: Recognizes or recalls 90% of the time/requires cueing < 10% of the time   Medical Problem List and Plan: 1. Functional deficits secondary to multitrauma   -continue CIR therapies   -afo ordered per hanger 2. RLE DVT/Anticoagulation: Pharmaceutical: Pradexa bid 3. Pain Management:  Showing  improvement.  Oxycodone prn  -continue fentanyl patch, decreased to --wean further once home  -increased gabapentin to  tid for likely right sciatic nerve injury  -AFO/PRAFO RLE 4. Mood: LCSW to follow for evaluation and support.  5. Neuropsych: This patient is capable of making decisions on his own behalf. 6. Skin/Wound Care:  dressing changes to tid. Surgery following as well.  7. Fluids/Electrolytes/Nutrition/hyponatremia:  Serum sodium holding at 126 today. Serum osmolality slightly low, Urine sodium wnl---likely more nutritional than anything else.    -FR 1800 cc  -dc'ed demeclocycline, dc na tabs also  -continue megace bid for appetite   -BMP stable on 12/23 8. Leucocytosis: Monitor for signs of infection. Has been afebrile.   -ucx positive for ecoli-- macrobid x7 days, completed today   -Elevated today, spoke with trauma, see note  -Xray reviewed on 12/23, no acute process 9. ABLA: hgb 9.4 today, due to trauma/blood loss/wounds.  10. Anxiety: ego suppport, pain control. 11. Tachycardia: Due to debility. Continue to follow, improving 12. 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

## 2015-07-14 NOTE — Progress Notes (Signed)
Patient ID: Raymond LemmingsBrian K Bowen, male   DOB: 1976-03-11, 39 y.o.   MRN: [id number]   LOS: 11 days   Subjective: Doing well, excited to be going home tomorrow. Denies change in abdominal pain, wound.   Objective: Vital signs in last 24 hours: Temp:  [97.5 F (36.4 C)-98 F (36.7 C)] 98 F (36.7 C) (12/23 0500) Pulse Rate:  [95] 95 (12/23 0500) Resp:  [18-19] 18 (12/23 0500) BP: (115-121)/(68-78) 121/68 mmHg (12/23 0500) SpO2:  [100 %] 100 % (12/23 0500) Last BM Date: 07/14/15   Laboratory  CBC  Recent Labs  07/14/15 0740  WBC 20.4*  HGB 9.4*  HCT 28.8*  PLT 414*   BMET  Recent Labs  07/13/15 0648 07/14/15 0740  NA 129* 129*  K 3.4* 3.5  CL 103 102  CO2 19* 18*  GLUCOSE 101* 105*  BUN <5* <5*  CREATININE 0.71 0.74  CALCIUM 10.7* 11.0*    Physical Exam General appearance: alert and no distress GI: Wound granulating well, STSG appears unchanged, slightly more fibrinic exudate inferior portion of wound without odor   Assessment/Plan: Abd wound w/leukocytosis -- I do not think the elevated WBC is coming from the patient's abd wound. I would CPM of that except increase dressing change to tid. I suspect this leukocytosis is coming from his DVT. However, if it's higher again tomorrow you could consider CT abd/pelvis w/oral and IV contrast to r/o an intraabdominal process. I think it's low yield but given he will be staying in [place] it's not unreasonable.  Please call with questions.    Freeman CaldronMichael J. Zarrah Loveland, PA-C Pager: [phone] General Trauma PA Pager: [phone]  07/14/2015

## 2015-07-14 NOTE — Consult Note (Signed)
WOC wound follow up Wound type:   Surgical, at the bedside with trauma today.  Assessment complete.  WOC has not been involved the the care of the abdominal wound, has been solely managed by trauma One ABRA site or either old penrose site just adjacent to the ostomy opening is still open, looks clean. 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  Measurement: did not measure today, see nursing notes Wound bed:100% clean, granulation tissue with autologous skin graft centrally with good take to the surrounding skin  Drainage (amount, consistency, odor) moderate, yellow, no odor Periwound:intact  Dressing procedure/placement/frequency:  1.  Adaptic over the skin graft, normal saline moist gauze around periphery of the wound bed to cover open wound. Top with ABD pad and secure with tape.  WOC taught sister and mother to perform. Casimiro NeedleMichael PA with trauma requested all of the dressing to be changed with each change.    2.  Bacitracin to last open Abra site, cover with bandaid or dry dressing. Apply/change daily.  3.  Silcone foam to trach wound, change every 3 days.   Followed up with bedside RN, will need someone to assess supplies in the room and provide supplies for wound care for the weekend.   WOC ostomy follow up Stoma type/location: RLQ, slit in skin, no visible stoma Stomal assessment/size: cutting at 32mm slightly oval.  Pattern provided to family today Peristomal assessment: mildly macerated  Treatment options for stomal/peristomal skin: 2" barrier ring to be used on the back of the wafer to aid in seal of pouch Output liquid, brown Ostomy pouching: 1pc with 2" barrier ring (4 pouches and 4 barrier rings) left in room for patient for DC.   Education provided:  Demonstrated pouch change to the mother and sister of the patient.  Video taped for future use.  Patient reports he has been emptying. He was able to close lock and roll closure  today. Enrolled patient in [place] [employer] Discharge program: Yes  Educational materials given to mother and sister. Handout with step by step instructions on pouch change given.    Discussed POC with patient and bedside nurse.  Re consult if needed, will not follow at this time. Thanks  Sequita Wise [employer] RN, CWOCN [phone])

## 2015-07-14 NOTE — Progress Notes (Addendum)
Patient A/O, no noted distress. Educated patient on emptying his colostomy. Patient was able to complete the task with minimal assistance. Staff will continue to monitor and meet needs. Patient refused dressing changes and topical on feet.

## 2015-07-14 NOTE — Progress Notes (Addendum)
Patient has had persistent leucocytosis but now with elevation in WBC to 20.  He has been afebrile and looks better overall. hyponatremia is resolving, po intake has improved and has completed treatment for UTI today. Will recheck UA/UCS and CXR to rule out infection.  Recheck CBC tomorrow as not showing any other signs of infection. Contacted trauma to evaluate wound and assist with decision on stability for discharge in am.

## 2015-07-14 NOTE — Progress Notes (Signed)
Social Work  Discharge Note  The overall goal for the admission was met for:   Discharge location: Yes - home with family in [place], [place]  Length of Stay: Yes - 12 days  Discharge activity level: Yes - supervision/ mod independent  Home/community participation: Yes  Services provided included: MD, RD, PT, OT, SLP, RN, TR, Pharmacy, Neuropsych and SW  Financial Services: Other: None - Medicaid and SSD applications completed during CIR stay  Follow-up services arranged: Home Health: RN via [facility] [place], [place]), DME: rolling walker and 3n1 commode via [place], Other: referred to [facility] program for medication assistance. and Patient/Family has no preference for HH/DME agencies  Comments (or additional information): At the time of d/c, I was still awaiting confirmation from a medical clinic in the [place], [place] area as to whether they could assume primary care for pt.  Family aware that I will follow up with them after discharge as the clinic cannot confirm until next week.  Patient/Family verbalized understanding of follow-up arrangements: Yes  Individual responsible for coordination of the follow-up plan: pt  Confirmed correct DME delivered: Raymond Bowen 07/14/2015    [place], [place]

## 2015-07-14 NOTE — Progress Notes (Signed)
Occupational Therapy Discharge Summary  Patient Details  Name: Raymond Bowen MRN: [id number] Date of Birth: Nov 28, 1975  Patient has met 7 of 7 long term goals due to improved activity tolerance, improved balance, postural control, ability to compensate for deficits and improved coordination.  Pt made steady progress with BADLs during this admission and is supervision with BADLs.  Pt is able to empty ostomy bag without assistance.  Pt's mother has good understanding of assist level and is pleased with progress during admission. Patient to discharge at overall Supervision level.  Patient's care partner is independent to provide the necessary physical assistance at discharge.     Recommendation:  Patient with no further OT needs at this time  Equipment: [facility]  Reasons for discharge: treatment goals met and discharge from hospital  Patient/family agrees with progress made and goals achieved: Yes  OT Discharge Precautions/Restrictions  Precautions Precautions: Fall Precaution Comments: R foot drop and high pain levels   Vision/Perception  Vision- History Baseline Vision/History: No visual deficits Patient Visual Report: No change from baseline Vision- Assessment Vision Assessment?: No apparent visual deficits  Cognition Overall Cognitive Status: Within Functional Limits for tasks assessed Arousal/Alertness: Awake/alert Orientation Level: Oriented X4 Attention: Selective Sustained Attention: Appears intact Selective Attention: Appears intact Memory: Appears intact Awareness: Appears intact Problem Solving: Appears intact Safety/Judgment: Appears intact Sensation Sensation Light Touch: Appears Intact Stereognosis: Appears Intact Hot/Cold: Appears Intact Proprioception: Appears Intact Coordination Gross Motor Movements are Fluid and Coordinated: Yes Fine Motor Movements are Fluid and Coordinated: Yes Mobility  Transfers Sit to Stand: 6: Modified independent (Device/Increase  time) Stand to Sit: 6: Modified independent (Device/Increase time)  Trunk/Postural Assessment  Cervical Assessment Cervical Assessment: Within Functional Limits Thoracic Assessment Thoracic Assessment: Within Functional Limits Lumbar Assessment Lumbar Assessment: Within Functional Limits Postural Control Postural Control: Deficits on evaluation (decreased balance strategies 2/2 RLE nerve injury)  Balance Balance Balance Assessed: Yes Dynamic Sitting Balance Dynamic Sitting - Balance Support: No upper extremity supported Dynamic Sitting - Level of Assistance: 6: Modified independent (Device/Increase time) Sitting balance - Comments: donning shoes Static Standing Balance Static Standing - Balance Support: Right upper extremity supported;Left upper extremity supported;Bilateral upper extremity supported Static Standing - Level of Assistance: 6: Modified independent (Device/Increase time) Dynamic Standing Balance Dynamic Standing - Balance Support: Right upper extremity supported;Left upper extremity supported;During functional activity Dynamic Standing - Level of Assistance: 6: Modified independent (Device/Increase time) (pulling up pants) Extremity/Trunk Assessment RUE Assessment RUE Assessment: Within Functional Limits LUE Assessment LUE Assessment: Within Functional Limits   See Function Navigator for Current Functional Status.  Leotis Shames [facility] 07/14/2015, 3:06 PM
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
## 2015-07-15 DIAGNOSIS — D72829 Elevated white blood cell count, unspecified: Secondary | ICD-10-CM | POA: Insufficient documentation

## 2015-07-15 LAB — URINE CULTURE: Culture: NO GROWTH

## 2015-07-15 LAB — CBC WITH DIFFERENTIAL/PLATELET
Basophils Absolute: 0 10*3/uL (ref 0.0–0.1)
Basophils Relative: 0 %
Eosinophils Absolute: 0.5 10*3/uL (ref 0.0–0.7)
Eosinophils Relative: 3 %
HCT: 26.1 % — ABNORMAL LOW (ref 39.0–52.0)
Hemoglobin: 8.9 g/dL — ABNORMAL LOW (ref 13.0–17.0)
Lymphocytes Relative: 27 %
Lymphs Abs: 4.9 10*3/uL — ABNORMAL HIGH (ref 0.7–4.0)
MCH: 28.7 pg (ref 26.0–34.0)
MCHC: 34.1 g/dL (ref 30.0–36.0)
MCV: 84.2 fL (ref 78.0–100.0)
Monocytes Absolute: 1.6 10*3/uL — ABNORMAL HIGH (ref 0.1–1.0)
Monocytes Relative: 9 %
Neutro Abs: 11.2 10*3/uL — ABNORMAL HIGH (ref 1.7–7.7)
Neutrophils Relative %: 61 %
Platelets: 378 10*3/uL (ref 150–400)
RBC: 3.1 MIL/uL — ABNORMAL LOW (ref 4.22–5.81)
RDW: 16.6 % — ABNORMAL HIGH (ref 11.5–15.5)
WBC: 18.2 10*3/uL — ABNORMAL HIGH (ref 4.0–10.5)
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
## 2015-07-20 DIAGNOSIS — T07XXXA Unspecified multiple injuries, initial encounter: Secondary | ICD-10-CM

## 2015-07-24 ENCOUNTER — Encounter (HOSPITAL_COMMUNITY): Payer: Self-pay | Admitting: Emergency Medicine

## 2015-07-24 ENCOUNTER — Inpatient Hospital Stay (HOSPITAL_COMMUNITY)
Admission: EM | Admit: 2015-07-24 | Discharge: 2015-07-29 | DRG: 392 | Disposition: A | Discharge status: Home Health | Payer: Self-pay | Attending: Internal Medicine | Admitting: Internal Medicine

## 2015-07-24 DIAGNOSIS — F17210 Nicotine dependence, cigarettes, uncomplicated: Secondary | ICD-10-CM | POA: Diagnosis present

## 2015-07-24 DIAGNOSIS — Z91013 Allergy to seafood: Secondary | ICD-10-CM

## 2015-07-24 DIAGNOSIS — E872 Acidosis: Secondary | ICD-10-CM | POA: Diagnosis present

## 2015-07-24 DIAGNOSIS — D638 Anemia in other chronic diseases classified elsewhere: Secondary | ICD-10-CM | POA: Diagnosis present

## 2015-07-24 DIAGNOSIS — K56609 Unspecified intestinal obstruction, unspecified as to partial versus complete obstruction: Secondary | ICD-10-CM | POA: Insufficient documentation

## 2015-07-24 DIAGNOSIS — K5900 Constipation, unspecified: Secondary | ICD-10-CM | POA: Diagnosis present

## 2015-07-24 DIAGNOSIS — R109 Unspecified abdominal pain: Secondary | ICD-10-CM | POA: Diagnosis present

## 2015-07-24 DIAGNOSIS — Z86718 Personal history of other venous thrombosis and embolism: Secondary | ICD-10-CM

## 2015-07-24 DIAGNOSIS — T40605A Adverse effect of unspecified narcotics, initial encounter: Secondary | ICD-10-CM | POA: Diagnosis present

## 2015-07-24 DIAGNOSIS — K3184 Gastroparesis: Principal | ICD-10-CM | POA: Diagnosis present

## 2015-07-24 DIAGNOSIS — R112 Nausea with vomiting, unspecified: Secondary | ICD-10-CM

## 2015-07-24 DIAGNOSIS — R1110 Vomiting, unspecified: Secondary | ICD-10-CM | POA: Insufficient documentation

## 2015-07-24 DIAGNOSIS — R140 Abdominal distension (gaseous): Secondary | ICD-10-CM | POA: Insufficient documentation

## 2015-07-24 DIAGNOSIS — G8929 Other chronic pain: Secondary | ICD-10-CM | POA: Diagnosis present

## 2015-07-24 DIAGNOSIS — Z79891 Long term (current) use of opiate analgesic: Secondary | ICD-10-CM

## 2015-07-24 DIAGNOSIS — E876 Hypokalemia: Secondary | ICD-10-CM | POA: Diagnosis present

## 2015-07-24 DIAGNOSIS — K567 Ileus, unspecified: Secondary | ICD-10-CM | POA: Diagnosis not present

## 2015-07-24 DIAGNOSIS — R1013 Epigastric pain: Secondary | ICD-10-CM | POA: Insufficient documentation

## 2015-07-24 DIAGNOSIS — M21371 Foot drop, right foot: Secondary | ICD-10-CM | POA: Diagnosis present

## 2015-07-24 DIAGNOSIS — I82509 Chronic embolism and thrombosis of unspecified deep veins of unspecified lower extremity: Secondary | ICD-10-CM | POA: Diagnosis present

## 2015-07-24 DIAGNOSIS — Z932 Ileostomy status: Secondary | ICD-10-CM

## 2015-07-24 LAB — COMPREHENSIVE METABOLIC PANEL
ALT: 31 U/L (ref 17–63)
AST: 20 U/L (ref 15–41)
Albumin: 3.2 g/dL — ABNORMAL LOW (ref 3.5–5.0)
Alkaline Phosphatase: 183 U/L — ABNORMAL HIGH (ref 38–126)
Anion gap: 11 (ref 5–15)
BUN: 6 mg/dL (ref 6–20)
CO2: 21 mmol/L — ABNORMAL LOW (ref 22–32)
Calcium: 11.1 mg/dL — ABNORMAL HIGH (ref 8.9–10.3)
Chloride: 101 mmol/L (ref 101–111)
Creatinine, Ser: 0.9 mg/dL (ref 0.61–1.24)
GFR calc Af Amer: >60 mL/min (ref >60)
GFR calc non Af Amer: >60 mL/min (ref >60)
Glucose, Bld: 125 mg/dL — ABNORMAL HIGH (ref 65–99)
Potassium: 4.4 mmol/L (ref 3.5–5.1)
Sodium: 133 mmol/L — ABNORMAL LOW (ref 135–145)
Total Bilirubin: 0.4 mg/dL (ref 0.3–1.2)
Total Protein: 7.9 g/dL (ref 6.5–8.1)

## 2015-07-24 LAB — URINALYSIS, ROUTINE W REFLEX MICROSCOPIC
Bilirubin Urine: NEGATIVE
Glucose, UA: NEGATIVE mg/dL
Hgb urine dipstick: NEGATIVE
Ketones, ur: 15 mg/dL — AB
Leukocytes, UA: NEGATIVE
Nitrite: NEGATIVE
Protein, ur: 30 mg/dL — AB
Specific Gravity, Urine: 1.019 (ref 1.005–1.030)
pH: 6 (ref 5.0–8.0)

## 2015-07-24 LAB — CBC
HCT: 30.9 % — ABNORMAL LOW (ref 39.0–52.0)
Hemoglobin: 10.1 g/dL — ABNORMAL LOW (ref 13.0–17.0)
MCH: 27.8 pg (ref 26.0–34.0)
MCHC: 32.7 g/dL (ref 30.0–36.0)
MCV: 85.1 fL (ref 78.0–100.0)
Platelets: 651 10*3/uL — ABNORMAL HIGH (ref 150–400)
RBC: 3.63 MIL/uL — ABNORMAL LOW (ref 4.22–5.81)
RDW: 16.3 % — ABNORMAL HIGH (ref 11.5–15.5)
WBC: 12.5 10*3/uL — ABNORMAL HIGH (ref 4.0–10.5)

## 2015-07-24 LAB — URINE MICROSCOPIC-ADD ON

## 2015-07-24 LAB — LIPASE, BLOOD: Lipase: 25 U/L (ref 11–51)

## 2015-07-24 MED ORDER — ONDANSETRON 4 MG PO TBDP
ORAL_TABLET | ORAL | Status: AC
  Filled 2015-07-24: qty 1

## 2015-07-24 MED ORDER — PROMETHAZINE HCL 25 MG/ML IJ SOLN
25.0000 mg | Freq: Once | INTRAMUSCULAR | Status: AC
  Administered 2015-07-24: 25 mg via INTRAVENOUS
  Filled 2015-07-24: qty 1

## 2015-07-24 MED ORDER — HYDROMORPHONE HCL 1 MG/ML IJ SOLN
1.0000 mg | Freq: Once | INTRAMUSCULAR | Status: AC
Start: 2015-07-24 — End: 2015-07-24
  Administered 2015-07-24: 1 mg via INTRAVENOUS
  Filled 2015-07-24: qty 1

## 2015-07-24 MED ORDER — ONDANSETRON 4 MG PO TBDP
4.0000 mg | ORAL_TABLET | Freq: Once | ORAL | Status: AC | PRN
  Administered 2015-07-24: 4 mg via ORAL

## 2015-07-24 MED ORDER — SODIUM CHLORIDE 0.9 % IV BOLUS (SEPSIS)
1000.0000 mL | Freq: Once | INTRAVENOUS | Status: AC
Start: 2015-07-24 — End: 2015-07-25
  Administered 2015-07-24: 1000 mL via INTRAVENOUS

## 2015-07-24 NOTE — ED Notes (Addendum)
Pt reporting sob; Sp02 WDL at 98% on room air; Lung sounds assessed and clear bilat

## 2015-07-24 NOTE — ED Notes (Signed)
Pt. reports persistent emesis with generalized abdominal pain onset today , denies diarrhea . No fever or chills.
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
## 2015-07-24 NOTE — ED Notes (Signed)
RN asked patient to provide urine sample; pt states "I cant go, Im in too much pain"

## 2015-07-25 ENCOUNTER — Encounter (HOSPITAL_COMMUNITY)
Admission: EM | Disposition: A | Discharge status: Home Health | Payer: Self-pay | Source: Home / Self Care | Attending: Internal Medicine

## 2015-07-25 ENCOUNTER — Encounter (HOSPITAL_COMMUNITY): Payer: Self-pay | Admitting: Radiology

## 2015-07-25 ENCOUNTER — Emergency Department (HOSPITAL_COMMUNITY): Payer: Self-pay

## 2015-07-25 DIAGNOSIS — R109 Unspecified abdominal pain: Secondary | ICD-10-CM | POA: Diagnosis present

## 2015-07-25 DIAGNOSIS — R1084 Generalized abdominal pain: Secondary | ICD-10-CM

## 2015-07-25 DIAGNOSIS — R1110 Vomiting, unspecified: Secondary | ICD-10-CM | POA: Insufficient documentation

## 2015-07-25 DIAGNOSIS — R112 Nausea with vomiting, unspecified: Secondary | ICD-10-CM

## 2015-07-25 HISTORY — PX: ESOPHAGOGASTRODUODENOSCOPY: SHX5428

## 2015-07-25 LAB — COMPREHENSIVE METABOLIC PANEL
ALT: 24 U/L (ref 17–63)
AST: 14 U/L — ABNORMAL LOW (ref 15–41)
Albumin: 2.9 g/dL — ABNORMAL LOW (ref 3.5–5.0)
Alkaline Phosphatase: 154 U/L — ABNORMAL HIGH (ref 38–126)
Anion gap: 9 (ref 5–15)
BUN: 7 mg/dL (ref 6–20)
CO2: 20 mmol/L — ABNORMAL LOW (ref 22–32)
Calcium: 9.9 mg/dL (ref 8.9–10.3)
Chloride: 106 mmol/L (ref 101–111)
Creatinine, Ser: 0.75 mg/dL (ref 0.61–1.24)
GFR calc Af Amer: >60 mL/min (ref >60)
GFR calc non Af Amer: >60 mL/min (ref >60)
Glucose, Bld: 121 mg/dL — ABNORMAL HIGH (ref 65–99)
Potassium: 4.1 mmol/L (ref 3.5–5.1)
Sodium: 135 mmol/L (ref 135–145)
Total Bilirubin: 0.3 mg/dL (ref 0.3–1.2)
Total Protein: 7 g/dL (ref 6.5–8.1)

## 2015-07-25 LAB — CBC WITH DIFFERENTIAL/PLATELET
Basophils Absolute: 0 10*3/uL (ref 0.0–0.1)
Basophils Relative: 0 %
Eosinophils Absolute: 0 10*3/uL (ref 0.0–0.7)
Eosinophils Relative: 0 %
HCT: 26.9 % — ABNORMAL LOW (ref 39.0–52.0)
Hemoglobin: 8.8 g/dL — ABNORMAL LOW (ref 13.0–17.0)
Lymphocytes Relative: 19 %
Lymphs Abs: 1.7 10*3/uL (ref 0.7–4.0)
MCH: 27.7 pg (ref 26.0–34.0)
MCHC: 32.7 g/dL (ref 30.0–36.0)
MCV: 84.6 fL (ref 78.0–100.0)
Monocytes Absolute: 0.3 10*3/uL (ref 0.1–1.0)
Monocytes Relative: 4 %
Neutro Abs: 6.6 10*3/uL (ref 1.7–7.7)
Neutrophils Relative %: 77 %
Platelets: 579 10*3/uL — ABNORMAL HIGH (ref 150–400)
RBC: 3.18 MIL/uL — ABNORMAL LOW (ref 4.22–5.81)
RDW: 16.2 % — ABNORMAL HIGH (ref 11.5–15.5)
WBC: 8.7 10*3/uL (ref 4.0–10.5)

## 2015-07-25 LAB — RAPID URINE DRUG SCREEN, HOSP PERFORMED
Amphetamines: NOT DETECTED
Barbiturates: POSITIVE — AB
Benzodiazepines: POSITIVE — AB
Cocaine: NOT DETECTED
Opiates: POSITIVE — AB
Tetrahydrocannabinol: NOT DETECTED

## 2015-07-25 LAB — GLUCOSE, CAPILLARY
Glucose-Capillary: 95 mg/dL (ref 65–99)
Glucose-Capillary: 76 mg/dL (ref 65–99)

## 2015-07-25 SURGERY — EGD (ESOPHAGOGASTRODUODENOSCOPY)
Anesthesia: Moderate Sedation

## 2015-07-25 MED ORDER — DIPHENHYDRAMINE HCL 50 MG/ML IJ SOLN
INTRAMUSCULAR | Status: AC
  Filled 2015-07-25: qty 1

## 2015-07-25 MED ORDER — FENTANYL 50 MCG/HR TD PT72
50.0000 ug | MEDICATED_PATCH | TRANSDERMAL | Status: DC
  Administered 2015-07-25: 50 ug via TRANSDERMAL
  Filled 2015-07-25: qty 1

## 2015-07-25 MED ORDER — DIPHENHYDRAMINE HCL 50 MG/ML IJ SOLN
INTRAMUSCULAR | Status: DC | PRN
  Administered 2015-07-25: 25 mg via INTRAVENOUS

## 2015-07-25 MED ORDER — HYDROMORPHONE HCL 1 MG/ML IJ SOLN
1.0000 mg | INTRAMUSCULAR | Status: DC | PRN
  Administered 2015-07-25 – 2015-07-27 (×13): 1 mg via INTRAVENOUS
  Filled 2015-07-25 (×15): qty 1

## 2015-07-25 MED ORDER — PANTOPRAZOLE SODIUM 40 MG IV SOLR
40.0000 mg | Freq: Two times a day (BID) | INTRAVENOUS | Status: DC
  Administered 2015-07-25 – 2015-07-29 (×8): 40 mg via INTRAVENOUS
  Filled 2015-07-25 (×8): qty 40

## 2015-07-25 MED ORDER — MIDAZOLAM HCL 5 MG/ML IJ SOLN
INTRAMUSCULAR | Status: AC
  Filled 2015-07-25: qty 2

## 2015-07-25 MED ORDER — HYDROMORPHONE HCL 1 MG/ML IJ SOLN
1.0000 mg | INTRAMUSCULAR | Status: DC | PRN
Start: 2015-07-25 — End: 2015-07-25

## 2015-07-25 MED ORDER — FENTANYL 25 MCG/HR TD PT72: 25.0000 ug | MEDICATED_PATCH | TRANSDERMAL | Status: DC

## 2015-07-25 MED ORDER — ONDANSETRON HCL 4 MG/2ML IJ SOLN: 4.0000 mg | Freq: Four times a day (QID) | INTRAMUSCULAR | Status: DC | PRN

## 2015-07-25 MED ORDER — PROMETHAZINE HCL 25 MG/ML IJ SOLN
25.0000 mg | Freq: Four times a day (QID) | INTRAMUSCULAR | Status: DC | PRN
  Administered 2015-07-25 – 2015-07-26 (×5): 25 mg via INTRAVENOUS
  Filled 2015-07-25 (×6): qty 1

## 2015-07-25 MED ORDER — ONDANSETRON HCL 4 MG/2ML IJ SOLN
4.0000 mg | Freq: Four times a day (QID) | INTRAMUSCULAR | Status: DC
  Administered 2015-07-25 – 2015-07-29 (×16): 4 mg via INTRAVENOUS
  Filled 2015-07-25 (×16): qty 2

## 2015-07-25 MED ORDER — SODIUM CHLORIDE 0.9 % IV SOLN
INTRAVENOUS | Status: AC
  Administered 2015-07-25 – 2015-07-26 (×3): via INTRAVENOUS

## 2015-07-25 MED ORDER — PANTOPRAZOLE SODIUM 40 MG IV SOLR
40.0000 mg | Freq: Once | INTRAVENOUS | Status: AC
  Administered 2015-07-25: 40 mg via INTRAVENOUS
  Filled 2015-07-25: qty 40

## 2015-07-25 MED ORDER — FENTANYL CITRATE (PF) 100 MCG/2ML IJ SOLN
INTRAMUSCULAR | Status: AC
  Filled 2015-07-25: qty 2

## 2015-07-25 MED ORDER — METOCLOPRAMIDE HCL 5 MG/ML IJ SOLN
5.0000 mg | Freq: Three times a day (TID) | INTRAMUSCULAR | Status: DC
  Administered 2015-07-25: 5 mg via INTRAVENOUS
  Filled 2015-07-25: qty 2

## 2015-07-25 MED ORDER — ENOXAPARIN SODIUM 80 MG/0.8ML ~~LOC~~ SOLN
1.0000 mg/kg | Freq: Two times a day (BID) | SUBCUTANEOUS | Status: DC
  Administered 2015-07-25 – 2015-07-29 (×9): 75 mg via SUBCUTANEOUS
  Filled 2015-07-25 (×9): qty 0.8

## 2015-07-25 MED ORDER — SUCRALFATE 1 GM/10ML PO SUSP
1.0000 g | Freq: Three times a day (TID) | ORAL | Status: DC
  Administered 2015-07-25 – 2015-07-29 (×15): 1 g via ORAL
  Filled 2015-07-25 (×16): qty 10

## 2015-07-25 MED ORDER — GI COCKTAIL ~~LOC~~
30.0000 mL | Freq: Once | ORAL | Status: AC
  Administered 2015-07-25: 30 mL via ORAL
  Filled 2015-07-25: qty 30

## 2015-07-25 MED ORDER — PANTOPRAZOLE SODIUM 40 MG IV SOLR
40.0000 mg | INTRAVENOUS | Status: DC
  Administered 2015-07-25: 40 mg via INTRAVENOUS
  Filled 2015-07-25: qty 40

## 2015-07-25 MED ORDER — FENTANYL CITRATE (PF) 100 MCG/2ML IJ SOLN
INTRAMUSCULAR | Status: DC | PRN
  Administered 2015-07-25: 12.5 ug via INTRAVENOUS
  Administered 2015-07-25 (×2): 25 ug via INTRAVENOUS
  Administered 2015-07-25: 12.5 ug via INTRAVENOUS

## 2015-07-25 MED ORDER — SODIUM CHLORIDE 0.9 % IV SOLN: INTRAVENOUS | Status: DC

## 2015-07-25 MED ORDER — BUTAMBEN-TETRACAINE-BENZOCAINE 2-2-14 % EX AERO
INHALATION_SPRAY | CUTANEOUS | Status: DC | PRN
  Administered 2015-07-25: 2 via TOPICAL

## 2015-07-25 MED ORDER — HYDROMORPHONE HCL 1 MG/ML IJ SOLN
1.0000 mg | INTRAMUSCULAR | Status: DC | PRN
  Administered 2015-07-25 (×2): 1 mg via INTRAVENOUS
  Filled 2015-07-25 (×2): qty 1

## 2015-07-25 MED ORDER — ONDANSETRON HCL 4 MG/2ML IJ SOLN
4.0000 mg | Freq: Once | INTRAMUSCULAR | Status: AC
  Administered 2015-07-25: 4 mg via INTRAVENOUS
  Filled 2015-07-25: qty 2

## 2015-07-25 MED ORDER — HYDROMORPHONE HCL 1 MG/ML IJ SOLN
1.0000 mg | Freq: Once | INTRAMUSCULAR | Status: AC
  Administered 2015-07-25: 1 mg via INTRAVENOUS
  Filled 2015-07-25: qty 1

## 2015-07-25 MED ORDER — MIDAZOLAM HCL 10 MG/2ML IJ SOLN
INTRAMUSCULAR | Status: DC | PRN
  Administered 2015-07-25: 1 mg via INTRAVENOUS
  Administered 2015-07-25 (×3): 2 mg via INTRAVENOUS

## 2015-07-25 MED ORDER — METOCLOPRAMIDE HCL 5 MG/ML IJ SOLN
10.0000 mg | Freq: Three times a day (TID) | INTRAMUSCULAR | Status: DC
Start: 2015-07-25 — End: 2015-07-26
  Administered 2015-07-25 – 2015-07-26 (×2): 10 mg via INTRAVENOUS
  Filled 2015-07-25 (×2): qty 2

## 2015-07-25 MED ORDER — IOHEXOL 300 MG/ML  SOLN
100.0000 mL | Freq: Once | INTRAMUSCULAR | Status: AC | PRN
  Administered 2015-07-25: 100 mL via INTRAVENOUS

## 2015-07-25 MED ORDER — SODIUM CHLORIDE 0.9 % IV BOLUS (SEPSIS)
1000.0000 mL | Freq: Once | INTRAVENOUS | Status: AC
  Administered 2015-07-25: 1000 mL via INTRAVENOUS

## 2015-07-25 MED ORDER — FENTANYL 50 MCG/HR TD PT72
50.0000 ug | MEDICATED_PATCH | TRANSDERMAL | Status: DC
  Administered 2015-07-28: 50 ug via TRANSDERMAL
  Filled 2015-07-25: qty 1

## 2015-07-25 NOTE — ED Notes (Signed)
Patient called out again wanting results of his CT scan and wanting more pain medication.

## 2015-07-25 NOTE — Interval H&P Note (Signed)
History and Physical Interval Note:  07/25/2015 4:19 PM  Raymond Bowen  has presented today for surgery, with the diagnosis of Nausea, vomiting, epigastric pain  The various methods of treatment have been discussed with the patient and family. After consideration of risks, benefits and other options for treatment, the patient has consented to  Procedure(s): ESOPHAGOGASTRODUODENOSCOPY (EGD) (N/A) as a surgical intervention .  The patient's history has been reviewed, patient examined, no change in status, stable for surgery.  I have reviewed the patient's chart and labs.  Questions were answered to the patient's satisfaction.     Keithon Mccoin D
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
## 2015-07-25 NOTE — Progress Notes (Signed)
RN text paged twice for advisement of orders for patient who arrived at 0530. No one returned RN's call. RN paged a third time. RN received a call back from MD, Ghimire. MD informed RN that he would be up to see patient when he got an opportunity. RN informed MD that patient is very anxious because he has not seen a doctor and does not have any available pain medication to take. MD informed RN that he would be up to see the patient at his earliest convenience after overlooking the patients chart. RN updated day shift RN of the potential plan of care for the patient. Nursing will continue to monitor.
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
## 2015-07-25 NOTE — Progress Notes (Signed)
Utilization review completed.  
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
## 2015-07-25 NOTE — Progress Notes (Signed)
Pt feeling nauseous and vomited a small amount of liquid green emesis. Still complaining of abdominal pain. Vital signs taken and MD notified. Orders placed for an EGD later this afternoon. Will continue to monitor.    07/25/15 1456  Vitals  Temp 99.2 F (37.3 C)  Temp Source Oral  BP 121/82 mmHg  BP Location Left Arm  BP Method Automatic  Patient Position (if appropriate) Lying  Pulse Rate 88  Pulse Rate Source Dinamap  Resp 14

## 2015-07-25 NOTE — Op Note (Signed)
[facility] [address] [place], [id number]   ENDOSCOPY PROCEDURE REPORT  PATIENT: Raymond Bowen, Raymond Bowen  MR#: [id number] BIRTHDATE: 03-21-1976 , 39  yrs. old GENDER: male ENDOSCOPIST:Issaih Kaus Elnoria HowardHung, MD REFERRED BY: PROCEDURE DATE:  07/25/2015 PROCEDURE:   EGD, diagnostic ASA CLASS:    Class [name] INDICATIONS: Acute nausea, vomiting, and epigastric pain MEDICATION: Benadryl 25 mg IV, Versed 7 mg IV, and Fentanyl 75 mcg IV TOPICAL ANESTHETIC:   Cetacaine Spray  DESCRIPTION OF PROCEDURE:   After the risks and benefits of the procedure were explained, informed consent was obtained.  The Pentax Gastroscope [id number]  endoscope was introduced through the mouth  and advanced to the second portion of the duodenum .  The instrument was slowly withdrawn as the mucosa was fully examined. Estimated blood loss is zero unless otherwise noted in this procedure report.   FINDINGS: The esophagus was normal.  No evidence of esophagitis and the Z-line was sharp.  In the gastric lumen a copious amount of green liquid was suctioned.  Careful and repeated inspection of the gastric mucosa with good distension was negative for any evidence of ulcers, erosions, or inflammation.  Just beyond the pylorus a surgical suture was identified.  The patient did have a history of a gastrostomy tube.  The duodenum was intact and normal.  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    Retroflexed views revealed no abnormalities. The scope was then withdrawn from the patient and the procedure completed.  COMPLICATIONS: There were no immediate complications.  ENDOSCOPIC IMPRESSION: 1) ? Gastroparesis.  RECOMMENDATIONS: 1) Continue with metoclopramide and antiemetics. 2) If no change tomorrow, initial erythromycin 250 mg IV QID for promotility effect. _______________________________ eSignedJeani Bowen:  Raymond Sibilia, MD 07/25/2015 5:14 PM    cc: CPT  CODES: ICD CODES:
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
## 2015-07-26 ENCOUNTER — Encounter (HOSPITAL_COMMUNITY): Payer: Self-pay | Admitting: Gastroenterology

## 2015-07-26 DIAGNOSIS — I82431 Acute embolism and thrombosis of right popliteal vein: Secondary | ICD-10-CM

## 2015-07-26 DIAGNOSIS — R1110 Vomiting, unspecified: Secondary | ICD-10-CM

## 2015-07-26 LAB — CBC
HCT: 28.6 % — ABNORMAL LOW (ref 39.0–52.0)
Hemoglobin: 9 g/dL — ABNORMAL LOW (ref 13.0–17.0)
MCH: 27.3 pg (ref 26.0–34.0)
MCHC: 31.5 g/dL (ref 30.0–36.0)
MCV: 86.7 fL (ref 78.0–100.0)
Platelets: 585 10*3/uL — ABNORMAL HIGH (ref 150–400)
RBC: 3.3 MIL/uL — ABNORMAL LOW (ref 4.22–5.81)
RDW: 16.6 % — ABNORMAL HIGH (ref 11.5–15.5)
WBC: 11.2 10*3/uL — ABNORMAL HIGH (ref 4.0–10.5)

## 2015-07-26 LAB — BASIC METABOLIC PANEL
Anion gap: 8 (ref 5–15)
BUN: 10 mg/dL (ref 6–20)
CO2: 21 mmol/L — ABNORMAL LOW (ref 22–32)
Calcium: 9.3 mg/dL (ref 8.9–10.3)
Chloride: 110 mmol/L (ref 101–111)
Creatinine, Ser: 0.9 mg/dL (ref 0.61–1.24)
GFR calc Af Amer: >60 mL/min (ref >60)
GFR calc non Af Amer: >60 mL/min (ref >60)
Glucose, Bld: 83 mg/dL (ref 65–99)
Potassium: 3.3 mmol/L — ABNORMAL LOW (ref 3.5–5.1)
Sodium: 139 mmol/L (ref 135–145)

## 2015-07-26 LAB — GLUCOSE, CAPILLARY
Glucose-Capillary: 65 mg/dL (ref 65–99)
Glucose-Capillary: 88 mg/dL (ref 65–99)
Glucose-Capillary: 92 mg/dL (ref 65–99)

## 2015-07-26 MED ORDER — SENNA 8.6 MG PO TABS
2.0000 | ORAL_TABLET | Freq: Every day | ORAL | Status: DC
  Administered 2015-07-26 – 2015-07-29 (×3): 17.2 mg via ORAL
  Filled 2015-07-26 (×3): qty 2

## 2015-07-26 MED ORDER — METOCLOPRAMIDE HCL 5 MG/ML IJ SOLN
10.0000 mg | Freq: Three times a day (TID) | INTRAMUSCULAR | Status: DC
  Administered 2015-07-26 – 2015-07-29 (×13): 10 mg via INTRAVENOUS
  Filled 2015-07-26 (×13): qty 2

## 2015-07-26 MED ORDER — POTASSIUM CHLORIDE CRYS ER 20 MEQ PO TBCR
40.0000 meq | EXTENDED_RELEASE_TABLET | Freq: Once | ORAL | Status: AC
  Administered 2015-07-26: 40 meq via ORAL
  Filled 2015-07-26: qty 2

## 2015-07-26 MED ORDER — GABAPENTIN 300 MG PO CAPS
300.0000 mg | ORAL_CAPSULE | Freq: Three times a day (TID) | ORAL | Status: DC
  Administered 2015-07-26 – 2015-07-29 (×10): 300 mg via ORAL
  Filled 2015-07-26 (×10): qty 1

## 2015-07-26 MED ORDER — LORAZEPAM 1 MG PO TABS
1.0000 mg | ORAL_TABLET | Freq: Two times a day (BID) | ORAL | Status: DC | PRN
  Administered 2015-07-26 – 2015-07-29 (×6): 1 mg via ORAL
  Filled 2015-07-26 (×6): qty 1

## 2015-07-26 MED ORDER — TRAZODONE HCL 50 MG PO TABS
50.0000 mg | ORAL_TABLET | Freq: Every day | ORAL | Status: DC
  Administered 2015-07-26 – 2015-07-28 (×3): 50 mg via ORAL
  Filled 2015-07-26 (×3): qty 1

## 2015-07-26 MED ORDER — POLYETHYLENE GLYCOL 3350 17 G PO PACK
17.0000 g | PACK | Freq: Two times a day (BID) | ORAL | Status: DC
  Administered 2015-07-26 – 2015-07-29 (×4): 17 g via ORAL
  Filled 2015-07-26 (×6): qty 1

## 2015-07-26 MED ORDER — OXYCODONE HCL 5 MG PO TABS
15.0000 mg | ORAL_TABLET | Freq: Four times a day (QID) | ORAL | Status: DC | PRN
  Administered 2015-07-26: 15 mg via ORAL
  Administered 2015-07-26: 20 mg via ORAL
  Administered 2015-07-27: 15 mg via ORAL
  Administered 2015-07-27: 20 mg via ORAL
  Administered 2015-07-27: 15 mg via ORAL
  Administered 2015-07-28 – 2015-07-29 (×6): 20 mg via ORAL
  Filled 2015-07-26: qty 4
  Filled 2015-07-26: qty 3
  Filled 2015-07-26 (×3): qty 4
  Filled 2015-07-26: qty 3
  Filled 2015-07-26 (×5): qty 4

## 2015-07-26 NOTE — Progress Notes (Addendum)
Patient A/O, no noted distress. Remains NPO. Patient received his pain regimen every two hours. Patient voiced that he did not get is pain med on time at that time was apprx. 2300. Educated patient that nurses can not administered prn pain regimen to patient while they were sleep. Patient stayed up the entire night on using the call light system to make sure he was administered his pain regimen on time. Patient colostomy was emptied which only had a 100cc, brown stool. Patient requested RN rinsed the bag out. Tech assisted patient with bath. Urine was collected and sent to lab. Staff will continue to monitor and meet needs. He c/o being SOB, but sat 98% RA.
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
## 2015-07-26 NOTE — Progress Notes (Signed)
Wasted 2 oxy 10 mg with Hilbert Corriganarolyn Osborne pt did not want them at the time he took Dil 1mg  instead - Eber JonesCarolyn witness disposal of 2 oxy in Transport [employer]har[employer] container, Pharmacy call and notified
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
## 2015-07-27 ENCOUNTER — Inpatient Hospital Stay (HOSPITAL_COMMUNITY): Payer: Self-pay

## 2015-07-27 LAB — GLUCOSE, CAPILLARY
Glucose-Capillary: 84 mg/dL (ref 65–99)
Glucose-Capillary: 102 mg/dL — ABNORMAL HIGH (ref 65–99)
Glucose-Capillary: 134 mg/dL — ABNORMAL HIGH (ref 65–99)
Glucose-Capillary: 115 mg/dL — ABNORMAL HIGH (ref 65–99)

## 2015-07-27 LAB — BASIC METABOLIC PANEL
Anion gap: 9 (ref 5–15)
BUN: 6 mg/dL (ref 6–20)
CO2: 19 mmol/L — ABNORMAL LOW (ref 22–32)
Calcium: 8.9 mg/dL (ref 8.9–10.3)
Chloride: 107 mmol/L (ref 101–111)
Creatinine, Ser: 0.82 mg/dL (ref 0.61–1.24)
GFR calc Af Amer: >60 mL/min (ref >60)
GFR calc non Af Amer: >60 mL/min (ref >60)
Glucose, Bld: 99 mg/dL (ref 65–99)
Potassium: 3 mmol/L — ABNORMAL LOW (ref 3.5–5.1)
Sodium: 135 mmol/L (ref 135–145)

## 2015-07-27 MED ORDER — HYDROMORPHONE HCL 1 MG/ML IJ SOLN
1.0000 mg | INTRAMUSCULAR | Status: DC | PRN
  Administered 2015-07-27 – 2015-07-29 (×12): 1 mg via INTRAVENOUS
  Filled 2015-07-27 (×12): qty 1

## 2015-07-27 MED ORDER — LORAZEPAM 2 MG/ML IJ SOLN
0.5000 mg | Freq: Once | INTRAMUSCULAR | Status: AC
  Administered 2015-07-27: 0.5 mg via INTRAVENOUS
  Filled 2015-07-27: qty 1

## 2015-07-27 MED ORDER — POTASSIUM CHLORIDE CRYS ER 20 MEQ PO TBCR
40.0000 meq | EXTENDED_RELEASE_TABLET | Freq: Once | ORAL | Status: AC
  Administered 2015-07-27: 40 meq via ORAL
  Filled 2015-07-27: qty 2

## 2015-07-27 MED ORDER — SIMETHICONE 40 MG/0.6ML PO SUSP
40.0000 mg | Freq: Three times a day (TID) | ORAL | Status: DC
Start: 2015-07-28 — End: 2015-07-29
  Administered 2015-07-28 – 2015-07-29 (×4): 40 mg via ORAL
  Filled 2015-07-27 (×7): qty 0.6

## 2015-07-27 MED ORDER — LORAZEPAM 2 MG/ML IJ SOLN: 1.0000 mg | Freq: Once | INTRAMUSCULAR | Status: DC
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
## 2015-07-27 NOTE — Consult Note (Signed)
WOC ostomy follow up Stoma type/location: RLQ, slit in the skin.  No stoma visible even with separation of this slit.  Stomal assessment/size: slit aprox. 1cm in length Peristomal assessment: slightly macerated and hard to the touch but this is not new Treatment options for stomal/peristomal skin: none Output liquid brown/green Ostomy pouching: 1pc.convex used, this patient had some success with 2pc convex at home, however the hospital does not have these available inpatient.   With the lack of a stoma and just a slit in the skin pouching will always be challenging for this patient.  I have tried to explain to the patient and his mother that 1-2 days wear time is most likely his best.  Convexity had been working at home so I have suggested they stay with that, however this patient is [place] [facility] pending and unless they have completed the indigent paperwork for [facility] they will have no access to pouches.  Right now they have  HH that will provide possibly ([facility]) it is out of [place] he [place] residing currently.  Education provided: reminded patient he must empty the pouch when 1/3 to 1/2 full or the pouch will most likely leak.  I have asked the patient to verify with his mother that they have contacted Selena BattenKim in the Customer Assistance program at [facility].  Enrolled patient in [employer] Secure Start Discharge program: Yes  Will attempt to obtain some samples of the 2pc pouch with convexity that the patient prefers.   WOC will follow along with you for continued support with ostomy care.   Kaziah Krizek [place] RN,CWOCN [phone]
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
## 2015-07-27 NOTE — Progress Notes (Signed)
[facility] informed that pt was having tremors and anxious. New order of 0.5 Ativan IV once was given. Will continue to monitor. Ilean SkillVeronica Gabreal Worton LPN

## 2015-07-28 ENCOUNTER — Inpatient Hospital Stay (HOSPITAL_COMMUNITY): Payer: Self-pay

## 2015-07-28 LAB — CBC
HCT: 26.4 % — ABNORMAL LOW (ref 39.0–52.0)
Hemoglobin: 8.9 g/dL — ABNORMAL LOW (ref 13.0–17.0)
MCH: 28.5 pg (ref 26.0–34.0)
MCHC: 33.7 g/dL (ref 30.0–36.0)
MCV: 84.6 fL (ref 78.0–100.0)
Platelets: 501 10*3/uL — ABNORMAL HIGH (ref 150–400)
RBC: 3.12 MIL/uL — ABNORMAL LOW (ref 4.22–5.81)
RDW: 16.1 % — ABNORMAL HIGH (ref 11.5–15.5)
WBC: 12.9 10*3/uL — ABNORMAL HIGH (ref 4.0–10.5)

## 2015-07-28 LAB — BASIC METABOLIC PANEL
Anion gap: 9 (ref 5–15)
BUN: <5 mg/dL — ABNORMAL LOW (ref 6–20)
CO2: 19 mmol/L — ABNORMAL LOW (ref 22–32)
Calcium: 9 mg/dL (ref 8.9–10.3)
Chloride: 109 mmol/L (ref 101–111)
Creatinine, Ser: 0.82 mg/dL (ref 0.61–1.24)
GFR calc Af Amer: >60 mL/min (ref >60)
GFR calc non Af Amer: >60 mL/min (ref >60)
Glucose, Bld: 90 mg/dL (ref 65–99)
Potassium: 3.4 mmol/L — ABNORMAL LOW (ref 3.5–5.1)
Sodium: 137 mmol/L (ref 135–145)

## 2015-07-28 LAB — GLUCOSE, CAPILLARY
Glucose-Capillary: 96 mg/dL (ref 65–99)
Glucose-Capillary: 87 mg/dL (ref 65–99)
Glucose-Capillary: 82 mg/dL (ref 65–99)
Glucose-Capillary: 64 mg/dL — ABNORMAL LOW (ref 65–99)
Glucose-Capillary: 94 mg/dL (ref 65–99)

## 2015-07-28 LAB — MAGNESIUM: Magnesium: 1.1 mg/dL — ABNORMAL LOW (ref 1.7–2.4)

## 2015-07-28 NOTE — Progress Notes (Signed)
Received message from Amada JupiterLucy Hoyle CSW at [facility] that patient's mother was requesting a call, [name] [phone]. Spoke with patient and received permission to contact his mother. Called Ms Ave FilterChandler x3, unable to leave message due to full voicemail.
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
## 2015-07-28 NOTE — Plan of Care (Signed)
Problem: Pain Managment: Goal: General experience of comfort will improve Outcome: Not Progressing Pt states not enough pain meds - MD made aware

## 2015-07-28 NOTE — Progress Notes (Signed)
ANTICOAGULATION CONSULT NOTE - Follow Up Consult  Pharmacy Consult for lovenox Indication: DVT  Allergies  Allergen Reactions  . Shellfish Allergy Anaphylaxis  . Shellfish Allergy Swelling    Crab legs, seafood     Patient Measurements: Weight: 164 lb 14.5 oz (74.8 kg)  Vital Signs: Temp: 98 F (36.7 C) (01/06 1325) Temp Source: Oral (01/06 0614) BP: 144/93 mmHg (01/06 1325) Pulse Rate: 96 (01/06 1325)  Labs:  Recent Labs  07/26/15 0510 07/27/15 1135 07/28/15 0605  HGB 9.0*  --  8.9*  HCT 28.6*  --  26.4*  PLT 585*  --  501*  CREATININE 0.90 0.82 0.82    Estimated Creatinine Clearance: 123.3 mL/min (by C-G formula based on Cr of 0.82).  Assessment: 39 YOM on Enox for recent DVT, bridging for Pradaxa pta d/t illeus, Hgb 8.9, low stable, pltc elevated but stable, Cr 0.82, renal function stable. No bleeding noted per chart.   Goal of Therapy:  Anti-Xa level 0.6-1 units/ml 4hrs after LMWH dose given Monitor platelets by anticoagulation protocol: Yes   Plan:  Continue Lovenox 75mg  q12 Monitor CBC and renal function F/U plan to resume Pradaxa ( per [facility] note when taking PO or at discharge)  Bayard HuggerMei Maalik Pinn, PharmD, BCPS  Clinical Pharmacist  Pager: [phone]   07/28/2015,2:21 PM
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
## 2015-07-29 DIAGNOSIS — R1013 Epigastric pain: Secondary | ICD-10-CM

## 2015-07-29 DIAGNOSIS — K5669 Other intestinal obstruction: Secondary | ICD-10-CM

## 2015-07-29 DIAGNOSIS — R140 Abdominal distension (gaseous): Secondary | ICD-10-CM

## 2015-07-29 DIAGNOSIS — G43A1 Cyclical vomiting, intractable: Secondary | ICD-10-CM

## 2015-07-29 DIAGNOSIS — K56609 Unspecified intestinal obstruction, unspecified as to partial versus complete obstruction: Secondary | ICD-10-CM | POA: Insufficient documentation

## 2015-07-29 LAB — GLUCOSE, CAPILLARY
Glucose-Capillary: 89 mg/dL (ref 65–99)
Glucose-Capillary: 89 mg/dL (ref 65–99)
Glucose-Capillary: 95 mg/dL (ref 65–99)

## 2015-07-29 LAB — BASIC METABOLIC PANEL
Anion gap: 8 (ref 5–15)
BUN: <5 mg/dL — ABNORMAL LOW (ref 6–20)
CO2: 21 mmol/L — ABNORMAL LOW (ref 22–32)
Calcium: 9.1 mg/dL (ref 8.9–10.3)
Chloride: 108 mmol/L (ref 101–111)
Creatinine, Ser: 0.81 mg/dL (ref 0.61–1.24)
GFR calc Af Amer: >60 mL/min (ref >60)
GFR calc non Af Amer: >60 mL/min (ref >60)
Glucose, Bld: 90 mg/dL (ref 65–99)
Potassium: 2.9 mmol/L — ABNORMAL LOW (ref 3.5–5.1)
Sodium: 137 mmol/L (ref 135–145)

## 2015-07-29 LAB — CBC
HCT: 28.1 % — ABNORMAL LOW (ref 39.0–52.0)
Hemoglobin: 8.9 g/dL — ABNORMAL LOW (ref 13.0–17.0)
MCH: 27.9 pg (ref 26.0–34.0)
MCHC: 31.7 g/dL (ref 30.0–36.0)
MCV: 88.1 fL (ref 78.0–100.0)
Platelets: 474 10*3/uL — ABNORMAL HIGH (ref 150–400)
RBC: 3.19 MIL/uL — ABNORMAL LOW (ref 4.22–5.81)
RDW: 16.1 % — ABNORMAL HIGH (ref 11.5–15.5)
WBC: 11.8 10*3/uL — ABNORMAL HIGH (ref 4.0–10.5)

## 2015-07-29 MED ORDER — SUCRALFATE 1 GM/10ML PO SUSP: 1.0000 g | Freq: Three times a day (TID) | ORAL | Status: DC

## 2015-07-29 MED ORDER — METOCLOPRAMIDE HCL 10 MG PO TABS: 10.0000 mg | ORAL_TABLET | Freq: Three times a day (TID) | ORAL | Status: DC

## 2015-07-29 MED ORDER — MEGESTROL ACETATE 400 MG/10ML PO SUSP
400.0000 mg | Freq: Two times a day (BID) | ORAL | Status: DC
  Administered 2015-07-29: 400 mg via ORAL
  Filled 2015-07-29 (×2): qty 10

## 2015-07-29 MED ORDER — ONDANSETRON HCL 4 MG PO TABS: 4.0000 mg | ORAL_TABLET | Freq: Three times a day (TID) | ORAL | Status: DC

## 2015-07-29 MED ORDER — MAGNESIUM SULFATE 2 GM/50ML IV SOLN
2.0000 g | Freq: Once | INTRAVENOUS | Status: AC
  Administered 2015-07-29: 2 g via INTRAVENOUS
  Filled 2015-07-29: qty 50

## 2015-07-29 MED ORDER — PROMETHAZINE HCL 25 MG RE SUPP: 25.0000 mg | Freq: Four times a day (QID) | RECTAL | Status: DC | PRN

## 2015-07-29 MED ORDER — PANTOPRAZOLE SODIUM 40 MG PO TBEC: 40.0000 mg | DELAYED_RELEASE_TABLET | Freq: Two times a day (BID) | ORAL | Status: DC

## 2015-07-29 MED ORDER — POTASSIUM CHLORIDE 10 MEQ/100ML IV SOLN
10.0000 meq | INTRAVENOUS | Status: AC
  Administered 2015-07-29 (×2): 10 meq via INTRAVENOUS
  Filled 2015-07-29 (×4): qty 100

## 2015-07-29 MED ORDER — OXYCODONE HCL 20 MG PO TABS: 10.0000 mg | ORAL_TABLET | Freq: Two times a day (BID) | ORAL | Status: DC | PRN

## 2015-07-29 MED ORDER — MEGESTROL ACETATE 400 MG/10ML PO SUSP: 400.0000 mg | Freq: Two times a day (BID) | ORAL | Status: DC

## 2015-07-29 MED ORDER — POTASSIUM CHLORIDE CRYS ER 20 MEQ PO TBCR
40.0000 meq | EXTENDED_RELEASE_TABLET | Freq: Once | ORAL | Status: AC
  Administered 2015-07-29: 40 meq via ORAL
  Filled 2015-07-29: qty 2

## 2015-07-29 NOTE — Discharge Instructions (Signed)
Follow with Primary MD, general surgeon and recommended GI physician in 3-4 days   Get CBC, CMP, Magnesium, 2 view Chest X ray checked  by Primary MD next visit.    Activity: As tolerated with Full fall precautions use walker/cane & assistance as needed   Disposition Home     Diet:   Soft small meals 4-5 times a day. Avoid large meals. Minimize pain medication use.  For Heart failure patients - Check your Weight same time everyday, if you gain over 2 pounds, or you develop in leg swelling, experience more shortness of breath or chest pain, call your Primary MD immediately. Follow Cardiac Low Salt Diet and 1.5 lit/day fluid restriction.   On your next visit with your primary care physician please Get Medicines reviewed and adjusted.   Please request your Prim.MD to go over all Hospital Tests and Procedure/Radiological results at the follow up, please get all Hospital records sent to your Prim MD by signing hospital release before you go home.   If you experience worsening of your admission symptoms, develop shortness of breath, life threatening emergency, suicidal or homicidal thoughts you must seek medical attention immediately by calling 911 or calling your MD immediately  if symptoms less severe.  You Must read complete instructions/literature along with all the possible adverse reactions/side effects for all the Medicines you take and that have been prescribed to you. Take any new Medicines after you have completely understood and accpet all the possible adverse reactions/side effects.   Do not drive, operating heavy machinery, perform activities at heights, swimming or participation in water activities or provide baby sitting services if your were admitted for syncope or siezures until you have seen by Primary MD or a Neurologist and advised to do so again.  Do not drive when taking Pain medications.    Do not take more than prescribed Pain, Sleep and Anxiety Medications  Special  Instructions: If you have smoked or chewed Tobacco  in the last 2 yrs please stop smoking, stop any regular Alcohol  and or any Recreational drug use.  Wear Seat belts while driving.   Please note  You were cared for by a hospitalist during your hospital stay. If you have any questions about your discharge medications or the care you received while you were in the hospital after you are discharged, you can call the unit and asked to speak with the hospitalist on call if the hospitalist that took care of you is not available. Once you are discharged, your primary care physician will handle any further medical issues. Please note that NO REFILLS for any discharge medications will be authorized once you are discharged, as it is imperative that you return to your primary care physician (or establish a relationship with a primary care physician if you do not have one) for your aftercare needs so that they can reassess your need for medications and monitor your lab values.

## 2015-07-29 NOTE — Care Management (Signed)
Case [facility] @ [phone] to inform them of patient's discharge plan. [facility] @ [phone].

## 2015-07-29 NOTE — Progress Notes (Signed)
Spoke with Case manager on the floor for [facility]

## 2015-07-29 NOTE — Progress Notes (Signed)
Pt's ride is here from [place] to take him home.  [facility] care lined up once pt is back home with sister who is a CNA.  Dressing was changed prior to him leaving and supplies given.  2 urinals given to pt.  Reviewed all DC instructions and copy given.
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
## 2015-08-03 ENCOUNTER — Telehealth: Payer: Self-pay | Admitting: *Deleted

## 2015-08-03 ENCOUNTER — Telehealth: Payer: Self-pay

## 2015-08-03 NOTE — Telephone Encounter (Signed)
Error

## 2015-08-03 NOTE — Telephone Encounter (Addendum)
I spoke with Raymond Bowen, Raymond Bowen,  (after getting a call from Raymond Bowen, who had just spoken with their office.  He was uncertain who ordered the  labwork in question and why it was now being called to our office. ) I explained to Raymond Bowen  that we discharged him 07/15/15 from inpt rehab and did not order the labwork, but it appears in epic that he was readmitted to the ED 07/24/15  and was seen by internal medicine and discharged 07/28/14 by Raymond SeaPrashant K Singh, MD [facility].  He is the one who wrote the orders for the follow up labs. Raymond Bowen had a K+ of 2.9 before leaving the last hospitalization with internal medicine on 07/29/15.  Raymond Bowen is indigent with no PCP and was told to [facility] at that discharge. He has ended up in [place] because his mother lives there. Raymond Bowen agreed to call Raymond Bowen's office [phone] and will try and follow up with Raymond Bowen since he is the MD who wrote the actual orders.

## 2015-08-03 NOTE — Telephone Encounter (Signed)
[facility] a fax [facility] that came in on 4:03 showing labs drawn on 08/02/15 with a critical lab value of potassium 2.8.  I spoke with Dr Riley KillSwartz about the value as soon as clinical staff received the report ( this was not called to us) but he did not order lab nor is he following this patient for his labs.  I will notify [facility] agency that we are not following. Call placed to [facility] with information we are not following.

## 2015-08-11 ENCOUNTER — Emergency Department (HOSPITAL_COMMUNITY): Payer: Self-pay

## 2015-08-11 ENCOUNTER — Inpatient Hospital Stay (HOSPITAL_COMMUNITY)
Admission: EM | Admit: 2015-08-11 | Discharge: 2015-08-17 | DRG: 392 | Disposition: A | Discharge status: Home Health | Payer: Self-pay | Attending: Internal Medicine | Admitting: Internal Medicine

## 2015-08-11 ENCOUNTER — Encounter (HOSPITAL_COMMUNITY): Payer: Self-pay | Admitting: Emergency Medicine

## 2015-08-11 DIAGNOSIS — E876 Hypokalemia: Secondary | ICD-10-CM | POA: Diagnosis present

## 2015-08-11 DIAGNOSIS — T50995A Adverse effect of other drugs, medicaments and biological substances, initial encounter: Secondary | ICD-10-CM | POA: Diagnosis present

## 2015-08-11 DIAGNOSIS — E860 Dehydration: Secondary | ICD-10-CM | POA: Diagnosis present

## 2015-08-11 DIAGNOSIS — Y92009 Unspecified place in unspecified non-institutional (private) residence as the place of occurrence of the external cause: Secondary | ICD-10-CM

## 2015-08-11 DIAGNOSIS — R6510 Systemic inflammatory response syndrome (SIRS) of non-infectious origin without acute organ dysfunction: Secondary | ICD-10-CM | POA: Diagnosis present

## 2015-08-11 DIAGNOSIS — R609 Edema, unspecified: Secondary | ICD-10-CM

## 2015-08-11 DIAGNOSIS — W3400XD Accidental discharge from unspecified firearms or gun, subsequent encounter: Secondary | ICD-10-CM

## 2015-08-11 DIAGNOSIS — Z91013 Allergy to seafood: Secondary | ICD-10-CM

## 2015-08-11 DIAGNOSIS — Z432 Encounter for attention to ileostomy: Secondary | ICD-10-CM

## 2015-08-11 DIAGNOSIS — Z932 Ileostomy status: Secondary | ICD-10-CM

## 2015-08-11 DIAGNOSIS — F17210 Nicotine dependence, cigarettes, uncomplicated: Secondary | ICD-10-CM | POA: Diagnosis present

## 2015-08-11 DIAGNOSIS — Z7901 Long term (current) use of anticoagulants: Secondary | ICD-10-CM

## 2015-08-11 DIAGNOSIS — I82509 Chronic embolism and thrombosis of unspecified deep veins of unspecified lower extremity: Secondary | ICD-10-CM | POA: Diagnosis present

## 2015-08-11 DIAGNOSIS — G629 Polyneuropathy, unspecified: Secondary | ICD-10-CM | POA: Diagnosis present

## 2015-08-11 DIAGNOSIS — E871 Hypo-osmolality and hyponatremia: Secondary | ICD-10-CM | POA: Diagnosis present

## 2015-08-11 DIAGNOSIS — S3210XD Unspecified fracture of sacrum, subsequent encounter for fracture with routine healing: Secondary | ICD-10-CM

## 2015-08-11 DIAGNOSIS — R112 Nausea with vomiting, unspecified: Secondary | ICD-10-CM | POA: Diagnosis present

## 2015-08-11 DIAGNOSIS — A419 Sepsis, unspecified organism: Secondary | ICD-10-CM | POA: Diagnosis present

## 2015-08-11 DIAGNOSIS — K3184 Gastroparesis: Principal | ICD-10-CM | POA: Diagnosis present

## 2015-08-11 DIAGNOSIS — E46 Unspecified protein-calorie malnutrition: Secondary | ICD-10-CM | POA: Diagnosis present

## 2015-08-11 DIAGNOSIS — M7989 Other specified soft tissue disorders: Secondary | ICD-10-CM | POA: Diagnosis present

## 2015-08-11 DIAGNOSIS — I825Z1 Chronic embolism and thrombosis of unspecified deep veins of right distal lower extremity: Secondary | ICD-10-CM | POA: Diagnosis present

## 2015-08-11 LAB — CBC WITH DIFFERENTIAL/PLATELET
Basophils Absolute: 0 10*3/uL (ref 0.0–0.1)
Basophils Relative: 0 %
Eosinophils Absolute: 0.1 10*3/uL (ref 0.0–0.7)
Eosinophils Relative: 0 %
HCT: 36.7 % — ABNORMAL LOW (ref 39.0–52.0)
Hemoglobin: 12.2 g/dL — ABNORMAL LOW (ref 13.0–17.0)
Lymphocytes Relative: 22 %
Lymphs Abs: 3.7 10*3/uL (ref 0.7–4.0)
MCH: 28.4 pg (ref 26.0–34.0)
MCHC: 33.2 g/dL (ref 30.0–36.0)
MCV: 85.3 fL (ref 78.0–100.0)
Monocytes Absolute: 1.1 10*3/uL — ABNORMAL HIGH (ref 0.1–1.0)
Monocytes Relative: 6 %
Neutro Abs: 12.1 10*3/uL — ABNORMAL HIGH (ref 1.7–7.7)
Neutrophils Relative %: 72 %
Platelets: 369 10*3/uL (ref 150–400)
RBC: 4.3 MIL/uL (ref 4.22–5.81)
RDW: 14.7 % (ref 11.5–15.5)
WBC: 16.9 10*3/uL — ABNORMAL HIGH (ref 4.0–10.5)

## 2015-08-11 LAB — COMPREHENSIVE METABOLIC PANEL
ALT: 19 U/L (ref 17–63)
AST: 16 U/L (ref 15–41)
Albumin: 3.5 g/dL (ref 3.5–5.0)
Alkaline Phosphatase: 107 U/L (ref 38–126)
Anion gap: 13 (ref 5–15)
BUN: 5 mg/dL — ABNORMAL LOW (ref 6–20)
CO2: 21 mmol/L — ABNORMAL LOW (ref 22–32)
Calcium: 9.3 mg/dL (ref 8.9–10.3)
Chloride: 100 mmol/L — ABNORMAL LOW (ref 101–111)
Creatinine, Ser: 0.79 mg/dL (ref 0.61–1.24)
GFR calc Af Amer: >60 mL/min (ref >60)
GFR calc non Af Amer: >60 mL/min (ref >60)
Glucose, Bld: 97 mg/dL (ref 65–99)
Potassium: 3.2 mmol/L — ABNORMAL LOW (ref 3.5–5.1)
Sodium: 134 mmol/L — ABNORMAL LOW (ref 135–145)
Total Bilirubin: 0.6 mg/dL (ref 0.3–1.2)
Total Protein: 7.3 g/dL (ref 6.5–8.1)

## 2015-08-11 LAB — I-STAT CG4 LACTIC ACID, ED: Lactic Acid, Venous: 2.43 mmol/L (ref 0.5–2.0)

## 2015-08-11 MED ORDER — HYDROMORPHONE HCL 1 MG/ML IJ SOLN
0.5000 mg | Freq: Once | INTRAMUSCULAR | Status: AC
  Administered 2015-08-11: 0.5 mg via INTRAVENOUS
  Filled 2015-08-11: qty 1

## 2015-08-11 MED ORDER — SODIUM CHLORIDE 0.9 % IV BOLUS (SEPSIS)
1000.0000 mL | Freq: Once | INTRAVENOUS | Status: AC
  Administered 2015-08-11: 1000 mL via INTRAVENOUS

## 2015-08-11 MED ORDER — ONDANSETRON HCL 4 MG/2ML IJ SOLN
4.0000 mg | Freq: Once | INTRAMUSCULAR | Status: AC
  Administered 2015-08-11: 4 mg via INTRAVENOUS
  Filled 2015-08-11: qty 2

## 2015-08-11 MED ORDER — METOCLOPRAMIDE HCL 5 MG/ML IJ SOLN
10.0000 mg | Freq: Once | INTRAMUSCULAR | Status: AC
  Administered 2015-08-12: 10 mg via INTRAVENOUS
  Filled 2015-08-11: qty 2

## 2015-08-11 MED ORDER — MORPHINE SULFATE (PF) 4 MG/ML IV SOLN
4.0000 mg | Freq: Once | INTRAVENOUS | Status: AC
  Administered 2015-08-11: 4 mg via INTRAVENOUS
  Filled 2015-08-11: qty 1

## 2015-08-11 MED ORDER — IOHEXOL 300 MG/ML  SOLN: 25.0000 mL | INTRAMUSCULAR | Status: AC
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
## 2015-08-11 NOTE — ED Notes (Signed)
Phlebotomy bedside. Pt. Notified of need for urine.

## 2015-08-11 NOTE — ED Notes (Signed)
IV attempted numerous times, unsuccessfully. MD aware

## 2015-08-12 ENCOUNTER — Encounter (HOSPITAL_COMMUNITY): Payer: Self-pay | Admitting: Family Medicine

## 2015-08-12 ENCOUNTER — Emergency Department (HOSPITAL_COMMUNITY): Payer: Self-pay

## 2015-08-12 DIAGNOSIS — E876 Hypokalemia: Secondary | ICD-10-CM

## 2015-08-12 DIAGNOSIS — Z432 Encounter for attention to ileostomy: Secondary | ICD-10-CM

## 2015-08-12 DIAGNOSIS — G43A1 Cyclical vomiting, intractable: Secondary | ICD-10-CM

## 2015-08-12 DIAGNOSIS — A419 Sepsis, unspecified organism: Secondary | ICD-10-CM | POA: Diagnosis present

## 2015-08-12 DIAGNOSIS — E871 Hypo-osmolality and hyponatremia: Secondary | ICD-10-CM

## 2015-08-12 DIAGNOSIS — R112 Nausea with vomiting, unspecified: Secondary | ICD-10-CM

## 2015-08-12 DIAGNOSIS — I82401 Acute embolism and thrombosis of unspecified deep veins of right lower extremity: Secondary | ICD-10-CM

## 2015-08-12 DIAGNOSIS — I82431 Acute embolism and thrombosis of right popliteal vein: Secondary | ICD-10-CM

## 2015-08-12 LAB — URINALYSIS, ROUTINE W REFLEX MICROSCOPIC
Glucose, UA: NEGATIVE mg/dL
Hgb urine dipstick: NEGATIVE
Ketones, ur: 15 mg/dL — AB
Leukocytes, UA: NEGATIVE
Nitrite: NEGATIVE
Protein, ur: NEGATIVE mg/dL
Specific Gravity, Urine: 1.011 (ref 1.005–1.030)
pH: 7 (ref 5.0–8.0)

## 2015-08-12 LAB — BASIC METABOLIC PANEL
Anion gap: 9 (ref 5–15)
BUN: <5 mg/dL — ABNORMAL LOW (ref 6–20)
CO2: 20 mmol/L — ABNORMAL LOW (ref 22–32)
Calcium: 7.7 mg/dL — ABNORMAL LOW (ref 8.9–10.3)
Chloride: 108 mmol/L (ref 101–111)
Creatinine, Ser: 0.78 mg/dL (ref 0.61–1.24)
GFR calc Af Amer: >60 mL/min (ref >60)
GFR calc non Af Amer: >60 mL/min (ref >60)
Glucose, Bld: 94 mg/dL (ref 65–99)
Potassium: 3.2 mmol/L — ABNORMAL LOW (ref 3.5–5.1)
Sodium: 137 mmol/L (ref 135–145)

## 2015-08-12 LAB — CBC
HCT: 26.6 % — ABNORMAL LOW (ref 39.0–52.0)
Hemoglobin: 9 g/dL — ABNORMAL LOW (ref 13.0–17.0)
MCH: 28.8 pg (ref 26.0–34.0)
MCHC: 33.8 g/dL (ref 30.0–36.0)
MCV: 85 fL (ref 78.0–100.0)
Platelets: 326 10*3/uL (ref 150–400)
RBC: 3.13 MIL/uL — ABNORMAL LOW (ref 4.22–5.81)
RDW: 14.8 % (ref 11.5–15.5)
WBC: 13.5 10*3/uL — ABNORMAL HIGH (ref 4.0–10.5)

## 2015-08-12 LAB — RAPID URINE DRUG SCREEN, HOSP PERFORMED
Amphetamines: NOT DETECTED
Barbiturates: NOT DETECTED
Benzodiazepines: NOT DETECTED
Cocaine: NOT DETECTED
Opiates: POSITIVE — AB
Tetrahydrocannabinol: NOT DETECTED

## 2015-08-12 LAB — LACTIC ACID, PLASMA
Lactic Acid, Venous: 0.8 mmol/L (ref 0.5–2.0)
Lactic Acid, Venous: 0.9 mmol/L (ref 0.5–2.0)

## 2015-08-12 LAB — LIPASE, BLOOD: Lipase: 28 U/L (ref 11–51)

## 2015-08-12 LAB — I-STAT CG4 LACTIC ACID, ED: Lactic Acid, Venous: 0.72 mmol/L (ref 0.5–2.0)

## 2015-08-12 MED ORDER — SODIUM CHLORIDE 0.9 % IV SOLN
INTRAVENOUS | Status: DC
  Administered 2015-08-12 – 2015-08-13 (×2): via INTRAVENOUS
  Filled 2015-08-12 (×5): qty 1000

## 2015-08-12 MED ORDER — HYDROMORPHONE HCL 1 MG/ML IJ SOLN
1.0000 mg | INTRAMUSCULAR | Status: DC | PRN
  Administered 2015-08-12 – 2015-08-17 (×30): 1 mg via INTRAVENOUS
  Filled 2015-08-12 (×30): qty 1

## 2015-08-12 MED ORDER — SODIUM CHLORIDE 0.9 % IJ SOLN
3.0000 mL | Freq: Two times a day (BID) | INTRAMUSCULAR | Status: DC
  Administered 2015-08-14: 3 mL via INTRAVENOUS

## 2015-08-12 MED ORDER — DABIGATRAN ETEXILATE MESYLATE 150 MG PO CAPS
150.0000 mg | ORAL_CAPSULE | Freq: Two times a day (BID) | ORAL | Status: DC
  Administered 2015-08-12 – 2015-08-17 (×11): 150 mg via ORAL
  Filled 2015-08-12 (×13): qty 1

## 2015-08-12 MED ORDER — ONDANSETRON HCL 4 MG PO TABS: 4.0000 mg | ORAL_TABLET | Freq: Four times a day (QID) | ORAL | Status: DC | PRN

## 2015-08-12 MED ORDER — METOCLOPRAMIDE HCL 5 MG/ML IJ SOLN
10.0000 mg | Freq: Three times a day (TID) | INTRAMUSCULAR | Status: AC
  Administered 2015-08-12 – 2015-08-13 (×6): 10 mg via INTRAVENOUS
  Filled 2015-08-12 (×6): qty 2

## 2015-08-12 MED ORDER — ACETAMINOPHEN 325 MG PO TABS
650.0000 mg | ORAL_TABLET | Freq: Four times a day (QID) | ORAL | Status: DC | PRN
  Administered 2015-08-14 – 2015-08-17 (×2): 650 mg via ORAL
  Filled 2015-08-12 (×2): qty 2

## 2015-08-12 MED ORDER — PIPERACILLIN-TAZOBACTAM 3.375 G IVPB
3.3750 g | Freq: Three times a day (TID) | INTRAVENOUS | Status: DC
  Administered 2015-08-12 – 2015-08-16 (×12): 3.375 g via INTRAVENOUS
  Filled 2015-08-12 (×14): qty 50

## 2015-08-12 MED ORDER — ONDANSETRON HCL 4 MG/2ML IJ SOLN: 4.0000 mg | Freq: Four times a day (QID) | INTRAMUSCULAR | Status: DC | PRN

## 2015-08-12 MED ORDER — PROMETHAZINE HCL 25 MG RE SUPP
25.0000 mg | Freq: Four times a day (QID) | RECTAL | Status: DC | PRN
  Filled 2015-08-12: qty 1

## 2015-08-12 MED ORDER — FENTANYL 50 MCG/HR TD PT72
50.0000 ug | MEDICATED_PATCH | TRANSDERMAL | Status: DC
  Administered 2015-08-12 – 2015-08-15 (×2): 50 ug via TRANSDERMAL
  Filled 2015-08-12: qty 1
  Filled 2015-08-12: qty 2

## 2015-08-12 MED ORDER — LORAZEPAM 2 MG/ML IJ SOLN
1.0000 mg | Freq: Once | INTRAMUSCULAR | Status: AC
  Administered 2015-08-12: 1 mg via INTRAVENOUS
  Filled 2015-08-12: qty 1

## 2015-08-12 MED ORDER — MORPHINE SULFATE (PF) 4 MG/ML IV SOLN
4.0000 mg | Freq: Once | INTRAVENOUS | Status: AC
  Administered 2015-08-12: 4 mg via INTRAVENOUS
  Filled 2015-08-12: qty 1

## 2015-08-12 MED ORDER — SODIUM CHLORIDE 0.9 % IV BOLUS (SEPSIS)
500.0000 mL | INTRAVENOUS | Status: AC
  Administered 2015-08-12: 500 mL via INTRAVENOUS

## 2015-08-12 MED ORDER — MEGESTROL ACETATE 400 MG/10ML PO SUSP
400.0000 mg | Freq: Two times a day (BID) | ORAL | Status: DC
  Administered 2015-08-12 – 2015-08-17 (×11): 400 mg via ORAL
  Filled 2015-08-12 (×15): qty 10

## 2015-08-12 MED ORDER — SODIUM CHLORIDE 0.9 % IV SOLN
INTRAVENOUS | Status: DC
  Administered 2015-08-12 (×2): via INTRAVENOUS

## 2015-08-12 MED ORDER — ACETAMINOPHEN 650 MG RE SUPP: 650.0000 mg | Freq: Four times a day (QID) | RECTAL | Status: DC | PRN

## 2015-08-12 MED ORDER — SUCRALFATE 1 GM/10ML PO SUSP
1.0000 g | Freq: Three times a day (TID) | ORAL | Status: DC
  Administered 2015-08-12 – 2015-08-17 (×22): 1 g via ORAL
  Filled 2015-08-12 (×25): qty 10

## 2015-08-12 MED ORDER — LORAZEPAM 1 MG PO TABS
1.0000 mg | ORAL_TABLET | Freq: Two times a day (BID) | ORAL | Status: DC | PRN
  Administered 2015-08-12 – 2015-08-17 (×10): 1 mg via ORAL
  Filled 2015-08-12 (×10): qty 1

## 2015-08-12 MED ORDER — POTASSIUM CHLORIDE 10 MEQ/100ML IV SOLN
10.0000 meq | INTRAVENOUS | Status: AC
  Administered 2015-08-12 (×2): 10 meq via INTRAVENOUS
  Filled 2015-08-12 (×3): qty 100

## 2015-08-12 MED ORDER — PIPERACILLIN-TAZOBACTAM 3.375 G IVPB 30 MIN
3.3750 g | Freq: Once | INTRAVENOUS | Status: AC
  Administered 2015-08-12: 3.375 g via INTRAVENOUS
  Filled 2015-08-12: qty 50

## 2015-08-12 MED ORDER — SODIUM CHLORIDE 0.9 % IV BOLUS (SEPSIS)
1000.0000 mL | INTRAVENOUS | Status: AC
  Administered 2015-08-12 (×2): 1000 mL via INTRAVENOUS

## 2015-08-12 MED ORDER — PANTOPRAZOLE SODIUM 40 MG PO TBEC
40.0000 mg | DELAYED_RELEASE_TABLET | Freq: Two times a day (BID) | ORAL | Status: DC
  Administered 2015-08-12 – 2015-08-17 (×11): 40 mg via ORAL
  Filled 2015-08-12 (×11): qty 1

## 2015-08-12 MED ORDER — GABAPENTIN 300 MG PO CAPS
300.0000 mg | ORAL_CAPSULE | Freq: Three times a day (TID) | ORAL | Status: DC
  Administered 2015-08-12 – 2015-08-17 (×16): 300 mg via ORAL
  Filled 2015-08-12 (×16): qty 1

## 2015-08-12 NOTE — ED Provider Notes (Signed)
Pt signed out by dr Lucilla Edin and abd ct w/o evidence of obstruction Will admit to medicine for intractable pain and emesis  Lorre Nick, MD 08/12/15 0110

## 2015-08-12 NOTE — ED Notes (Signed)
Pt. Very anxious.  Unable to rest comfortably .  Pain continues to be at a 10/10,  Will medicatio per orders
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
## 2015-08-12 NOTE — ED Notes (Signed)
Spoke with pharmacy, they will be sending medications

## 2015-08-12 NOTE — Progress Notes (Signed)
Discussed "hypodensity" with Radiology by phone, who recommended that these appeared to be very small cysts, imaged on CT with contrast 3 weeks ago, and not likely to represent abdominal abscess.  They could be followed up to document stability with imaging in 6 months.
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
## 2015-08-12 NOTE — ED Notes (Signed)
Admitting MD at the bedside, talking with pt. And updating him on plan of care.  Pt. Is eating lunch at the present time.
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
## 2015-08-12 NOTE — ED Notes (Signed)
Pt was brought here by family from [facility] 2 hour drive. Family is concerned about pt's condition and continued vomiting- pt has an appt on Wednesday with surgeon here in [place]. Also would like to talk with social worker for possible rehab placement.

## 2015-08-12 NOTE — ED Notes (Signed)
Pt.'s ileostomy bag broke.  Removed the bag, cleansed the area applied the stoma paste and new bag.  Within 1/2 hour the bag began to leak liquid.  Removed the bag , cleansed the area, this time applied a stoma powder and stoma paste and placed a new bag over the ileostomy .  Within 15 minutes the bag began to leak. Paged for the WOCN X3 no answer.  Called and spoke with [facility]. He will be down to assess the pt.
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
## 2015-08-12 NOTE — ED Notes (Signed)
Pt requesting pain medication. Dr.Mackuen spoke with pt regarding narcotics.

## 2015-08-12 NOTE — ED Notes (Signed)
Responded to call to assess patients ileostomy and help manage leakage. I attempted to contact [facility] with no return phone call. I cleansed and replaced the ostomy pouch and reported to the RN caring for the patient in the ED.

## 2015-08-12 NOTE — ED Notes (Signed)
Admitting MD at bedside.

## 2015-08-12 NOTE — Progress Notes (Signed)
Ileostomy bag changed times 4. It was changed twice in ED, twice on my shift per patient complaints of bag leaking. I have refused to change it anymore, tape applied to leaking area to help. Skin around stoma area is excoriated and sore looking. Skin prep applied and some barrier cream, also cleaned well around site. Difficult to fit a bag to site because stoma is flat. Will consult MD tomorrow and possibly Wound care nurse. Will continue to monitor.

## 2015-08-13 LAB — BASIC METABOLIC PANEL
Anion gap: 9 (ref 5–15)
BUN: <5 mg/dL — ABNORMAL LOW (ref 6–20)
CO2: 19 mmol/L — ABNORMAL LOW (ref 22–32)
Calcium: 8.7 mg/dL — ABNORMAL LOW (ref 8.9–10.3)
Chloride: 110 mmol/L (ref 101–111)
Creatinine, Ser: 0.64 mg/dL (ref 0.61–1.24)
GFR calc Af Amer: >60 mL/min (ref >60)
GFR calc non Af Amer: >60 mL/min (ref >60)
Glucose, Bld: 108 mg/dL — ABNORMAL HIGH (ref 65–99)
Potassium: 3.2 mmol/L — ABNORMAL LOW (ref 3.5–5.1)
Sodium: 138 mmol/L (ref 135–145)

## 2015-08-13 LAB — CBC
HCT: 29.2 % — ABNORMAL LOW (ref 39.0–52.0)
Hemoglobin: 9.6 g/dL — ABNORMAL LOW (ref 13.0–17.0)
MCH: 28.7 pg (ref 26.0–34.0)
MCHC: 32.9 g/dL (ref 30.0–36.0)
MCV: 87.2 fL (ref 78.0–100.0)
Platelets: 354 10*3/uL (ref 150–400)
RBC: 3.35 MIL/uL — ABNORMAL LOW (ref 4.22–5.81)
RDW: 15.1 % (ref 11.5–15.5)
WBC: 12.8 10*3/uL — ABNORMAL HIGH (ref 4.0–10.5)

## 2015-08-13 LAB — C DIFFICILE QUICK SCREEN W PCR REFLEX
C Diff antigen: NEGATIVE
C Diff interpretation: NEGATIVE
C Diff toxin: NEGATIVE

## 2015-08-13 LAB — URINE CULTURE: Culture: NO GROWTH

## 2015-08-13 LAB — MAGNESIUM: Magnesium: 0.9 mg/dL — CL (ref 1.7–2.4)

## 2015-08-13 MED ORDER — POTASSIUM CHLORIDE 10 MEQ/100ML IV SOLN
10.0000 meq | INTRAVENOUS | Status: AC
  Administered 2015-08-13 (×2): 10 meq via INTRAVENOUS
  Filled 2015-08-13 (×2): qty 100

## 2015-08-13 MED ORDER — MAGNESIUM SULFATE 2 GM/50ML IV SOLN
2.0000 g | Freq: Once | INTRAVENOUS | Status: AC
  Administered 2015-08-13: 2 g via INTRAVENOUS
  Filled 2015-08-13: qty 50

## 2015-08-13 MED ORDER — POTASSIUM CHLORIDE 2 MEQ/ML IV SOLN
INTRAVENOUS | Status: DC
  Administered 2015-08-13 – 2015-08-17 (×6): via INTRAVENOUS
  Filled 2015-08-13 (×14): qty 1000

## 2015-08-13 MED ORDER — METOCLOPRAMIDE HCL 10 MG PO TABS
10.0000 mg | ORAL_TABLET | Freq: Three times a day (TID) | ORAL | Status: DC
Start: 2015-08-14 — End: 2015-08-17
  Administered 2015-08-14 – 2015-08-17 (×11): 10 mg via ORAL
  Filled 2015-08-13 (×11): qty 1
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
## 2015-08-14 LAB — CBC
HCT: 28.7 % — ABNORMAL LOW (ref 39.0–52.0)
Hemoglobin: 9.5 g/dL — ABNORMAL LOW (ref 13.0–17.0)
MCH: 28.5 pg (ref 26.0–34.0)
MCHC: 33.1 g/dL (ref 30.0–36.0)
MCV: 86.2 fL (ref 78.0–100.0)
Platelets: 343 10*3/uL (ref 150–400)
RBC: 3.33 MIL/uL — ABNORMAL LOW (ref 4.22–5.81)
RDW: 15.1 % (ref 11.5–15.5)
WBC: 16.8 10*3/uL — ABNORMAL HIGH (ref 4.0–10.5)

## 2015-08-14 LAB — BASIC METABOLIC PANEL
Anion gap: 6 (ref 5–15)
BUN: <5 mg/dL — ABNORMAL LOW (ref 6–20)
CO2: 19 mmol/L — ABNORMAL LOW (ref 22–32)
Calcium: 8.9 mg/dL (ref 8.9–10.3)
Chloride: 115 mmol/L — ABNORMAL HIGH (ref 101–111)
Creatinine, Ser: 0.67 mg/dL (ref 0.61–1.24)
GFR calc Af Amer: >60 mL/min (ref >60)
GFR calc non Af Amer: >60 mL/min (ref >60)
Glucose, Bld: 107 mg/dL — ABNORMAL HIGH (ref 65–99)
Potassium: 3.8 mmol/L (ref 3.5–5.1)
Sodium: 140 mmol/L (ref 135–145)

## 2015-08-14 LAB — MAGNESIUM: Magnesium: 1.4 mg/dL — ABNORMAL LOW (ref 1.7–2.4)

## 2015-08-14 LAB — CG4 I-STAT (LACTIC ACID): Lactic Acid, Venous: 2.52 mmol/L (ref 0.5–2.0)

## 2015-08-14 MED ORDER — POTASSIUM CHLORIDE 10 MEQ/100ML IV SOLN
10.0000 meq | Freq: Once | INTRAVENOUS | Status: AC
  Administered 2015-08-14: 10 meq via INTRAVENOUS
  Filled 2015-08-14: qty 100

## 2015-08-14 MED ORDER — SORBITOL 70 % SOLN
960.0000 mL | TOPICAL_OIL | Freq: Once | ORAL | Status: AC
  Administered 2015-08-14: 960 mL via RECTAL
  Filled 2015-08-14: qty 240

## 2015-08-14 MED ORDER — MAGNESIUM SULFATE 2 GM/50ML IV SOLN
2.0000 g | Freq: Once | INTRAVENOUS | Status: AC
  Administered 2015-08-14: 2 g via INTRAVENOUS
  Filled 2015-08-14: qty 50
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
## 2015-08-14 NOTE — Consult Note (Signed)
WOC returned after attempt per bedside nursing to administer enema.  Skin is so denuded, will attempt to use Eakin and low wall suction to keep pouch empty at all times to allow skin time to heal.  Patient reports success with convex pouch at home, however now the skin is so raw no pouch will really stick to the skin.   I have asked the patient to lie on his left side for a short while in order to allow the skin to dry out on the right and in the right groin/flank.  Caregiver at the bedside to monitor pouch and catheter make sure catheter is draining the pouch.  Brionna Romanek [place], [place] [phone]

## 2015-08-14 NOTE — Clinical Documentation Improvement (Signed)
Internal Medicine  Can the diagnosis of "Malnourishment" (H&P) be further specified? Please document response in next progress note; NOT in BPA drop down box. Thanks!   Document Severity - Severe(third degree), Moderate (second degree), Mild (first degree)  Other condition  Unable to clinically determine  Malnutrition ruled out  Document any associated diagnoses/conditions  Supporting Information: :   Any fat or muscle wasting?  Supplementation being given?  Malnourishment:  -Megace as able - /6ml solution given twice daily ordered  Please exercise your independent, professional judgment when responding. A specific answer is not anticipated or expected.  Thank You, Shellee Milo RN, BSN, CCDS Health Information Management [place] [phone]; Cell: [phone]
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
## 2015-08-14 NOTE — Consult Note (Signed)
WOC ostomy consult note Stoma type/location: RLQ, NO STOMA visible, slit in skin.  Stomal assessment/size: retracted, not able to really visualize a stoma, has been like this since surgery. Complicates his recovery and management of output, pouching is challenging Weekend staff has changed pouches multiple times. Excellent idea over the weekend per bedside nurse to place to [facility] with Eakin however worked well for a while then began to leak, once leaking staff left in place until I arrived today Peristomal assessment: severely denuded skin extends along the distal abdomen, in the groin, on the right flank area Treatment options for stomal/peristomal skin: attempted to crust skin with stoma powder to treat denuded skin however the skin was so raw the powder burns the patient, additionally the effluent CONSTANTLY runs from the opening.  Output high volume, liquid stool, waterly Ostomy pouching:  Patient has SMOG enema ordered via stoma, I have placed irrigation pouch, however bottom of pouch turned loose from skin immediately due to the amount of wet, raw, denuded skin present.  Patient is anxious about enema.  I have explained the difficulty with his current pouching situation.    Previously the patient reports several days wear time with convex pouch, however now that skin is in the shape it is I have no choice but to place him in an Eakin pouch to suction for at least a few days in order to try to allow his skin to heal a bit.  X[place]th Standish [place], [place] [phone]

## 2015-08-14 NOTE — Progress Notes (Addendum)
Smog enema ordered per stoma for Pt. Enema administered, about half of liquid went into Pt intestine, liquid came right back out, same white color, with minimal stool output. Pt asked to stop, so enema was pulled out. Pt stated he was in great pain. Charma Igo PA notified. Will reassess.

## 2015-08-14 NOTE — Progress Notes (Signed)
Patient ID: Raymond Bowen, male   DOB: 1976/06/01, 40 y.o.   MRN: [id number]   LOS: 2 days   Subjective: Doing better, denies N/V. Stoma started putting out copious loose stool last night.   Objective: Vital signs in last 24 hours: Temp:  [98.3 F (36.8 C)-98.5 F (36.9 C)] 98.4 F (36.9 C) (01/23 0525) Pulse Rate:  [85-100] 100 (01/23 0525) Resp:  [16-18] 18 (01/23 0525) BP: (118-129)/(69-81) 118/70 mmHg (01/23 0525) SpO2:  [100 %] 100 % (01/23 0525) Weight:  [72.9 kg (160 lb 11.5 oz)] 72.9 kg (160 lb 11.5 oz) (01/23 0500) Last BM Date: 08/14/15   Laboratory  CBC  Recent Labs  08/13/15 0530 08/14/15 0440  WBC 12.8* 16.8*  HGB 9.6* 9.5*  HCT 29.2* 28.7*  PLT 354 343   BMET  Recent Labs  08/13/15 0530 08/14/15 0440  NA 138 140  K 3.2* 3.8  CL 110 115*  CO2 19* 19*  GLUCOSE 108* 107*  BUN <5* <5*  CREATININE 0.64 0.67  CALCIUM 8.7* 8.9    Physical Exam General appearance: alert and no distress GI: Soft, +BS Incision/Wound:   Assessment/Plan: Obstipation/constipation -- Will give SMOG enema through ostomy for colonic stool burden on CT Abd wound -- Culture result with Pseudomonas likely colonizer. I see no signs of infection. Do not advise antibiotics for abdominal wound. Continue wet-to-dry dressings.    Freeman Caldron, PA-C Pager: [phone] General Trauma PA Pager: [phone]  08/14/2015

## 2015-08-14 NOTE — Progress Notes (Signed)
Brief Nutrition Education Note  RD received consult for diet education on soft diet.   RD attempted education x 3 today, however either in with other disciplines (Case Management [facility]) or in severe pain. RD will follow-up with education on 08/15/15.   Shekia Kuper A. Mayford Knife, RD, LDN, CDE Pager: [phone] After hours Pager: [phone]
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
## 2015-08-15 DIAGNOSIS — I82411 Acute embolism and thrombosis of right femoral vein: Secondary | ICD-10-CM

## 2015-08-15 DIAGNOSIS — G43A0 Cyclical vomiting, not intractable: Secondary | ICD-10-CM

## 2015-08-15 LAB — CBC
HCT: 29.4 % — ABNORMAL LOW (ref 39.0–52.0)
Hemoglobin: 9.3 g/dL — ABNORMAL LOW (ref 13.0–17.0)
MCH: 27.4 pg (ref 26.0–34.0)
MCHC: 31.6 g/dL (ref 30.0–36.0)
MCV: 86.5 fL (ref 78.0–100.0)
Platelets: 365 10*3/uL (ref 150–400)
RBC: 3.4 MIL/uL — ABNORMAL LOW (ref 4.22–5.81)
RDW: 15.4 % (ref 11.5–15.5)
WBC: 15 10*3/uL — ABNORMAL HIGH (ref 4.0–10.5)

## 2015-08-15 LAB — BASIC METABOLIC PANEL
Anion gap: 6 (ref 5–15)
BUN: <5 mg/dL — ABNORMAL LOW (ref 6–20)
CO2: 19 mmol/L — ABNORMAL LOW (ref 22–32)
Calcium: 9.1 mg/dL (ref 8.9–10.3)
Chloride: 113 mmol/L — ABNORMAL HIGH (ref 101–111)
Creatinine, Ser: 0.7 mg/dL (ref 0.61–1.24)
GFR calc Af Amer: >60 mL/min (ref >60)
GFR calc non Af Amer: >60 mL/min (ref >60)
Glucose, Bld: 105 mg/dL — ABNORMAL HIGH (ref 65–99)
Potassium: 4.5 mmol/L (ref 3.5–5.1)
Sodium: 138 mmol/L (ref 135–145)

## 2015-08-15 LAB — MAGNESIUM: Magnesium: 1.7 mg/dL (ref 1.7–2.4)

## 2015-08-15 MED ORDER — ENSURE ENLIVE PO LIQD
237.0000 mL | Freq: Two times a day (BID) | ORAL | Status: DC
  Administered 2015-08-15 – 2015-08-17 (×4): 237 mL via ORAL

## 2015-08-15 MED ORDER — LOPERAMIDE HCL 2 MG PO CAPS
2.0000 mg | ORAL_CAPSULE | Freq: Two times a day (BID) | ORAL | Status: DC
  Administered 2015-08-15 – 2015-08-16 (×2): 2 mg via ORAL
  Filled 2015-08-15 (×2): qty 1
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
## 2015-08-15 NOTE — Consult Note (Signed)
WOC paged back to patient's room for leaking Eakin pouch, patient reports that it had been doing ok since replacement at 2, however "all of the sudden it just busted".  Leaking at same site where the skin is so wet, weeping, and denuded.  WOC attempted to dry area, however stoma leaks stool continuously.  After drying weeping, raw skin it still appears wet from the loss of the skin.  I have placed silver hydrofiber over the weeping areas and covered with large pieces of large hydrocolloid to attempt to protect these areas and dry the skin some.  Eakin placed to [facility]. Requested patient to stay in bed and off his right side for a while in order to all the pouch to adhere better to the skin.   Dr. Arbutus Leas at the bedside to report that patient is medically stable at this time, however with his skin in this shape the patient will have even a more difficult time to manage the ostomy.  His output is very high and he has no visible stoma, therefore the output does not enter the pouch easily.  Undermines the wafer of the pouch as it trickles constantly from the slit in the patient's skin.  Will follow up in the am to see how the current system works.  Patient will not be able to be DC to home with his skin in this current condition, may need LTAC or other level of care if DC is pending medically.  Wilena Tyndall [place] RN,CWOCN [phone]

## 2015-08-15 NOTE — Plan of Care (Signed)
Problem: Food- and Nutrition-Related Knowledge Deficit (NB-1.1) Goal: Nutrition education Formal process to instruct or train a patient/client in a skill or to impart knowledge to help patients/clients voluntarily manage or modify food choices and eating behavior to maintain or improve health. Outcome: Adequate for Discharge Nutrition Education Note  RD consulted for diet education for soft diet and small frequent meals.   Pt expressed frustration about not knowing what to eat. He was able to recall some basic principles of the low fiber diet, however, needed guidance about how to incorporate favorite foods into his diet.   Pt was given handout "Ileostomy Nutrition Therapy" from the Academy of Nutrition and Dietetics Manual. Pt was given a list of foods recommended and not recommended with a ileostomy. Discussed the importance of a low residue/low fiber diet in relations with his ileostomy. Reviewed list of foods that are high and low in fiber.  Recommended 5 small meals a day and adequate protein intake. Encourage a food diary. RD contact information was given.   Expect good compliance.   RD will continue to follow.   Pessy Delamar A. Mayford Knife, RD, LDN, CDE Pager: [phone] After hours Pager: [phone]

## 2015-08-15 NOTE — Progress Notes (Signed)
Pharmacy Antibiotic Follow-up Note  Raymond Bowen is a 40 y.o. year-old male admitted on 08/11/2015.  The patient is currently on day 4 of Zosyn for empiric coverage of intra-abdominal infection.  Assessment/Plan: This patient's current antibiotics will be continued without adjustments. (Zosyn 3.375g IV q8h EI)  Temp (24hrs), Avg:98.4 F (36.9 C), Min:98.2 F (36.8 C), Max:98.5 F (36.9 C)   Recent Labs Lab 08/11/15 2145 08/12/15 0530 08/13/15 0530 08/14/15 0440 08/15/15 0555  WBC 16.9* 13.5* 12.8* 16.8* 15.0*    Recent Labs Lab 08/11/15 2145 08/12/15 0530 08/13/15 0530 08/14/15 0440 08/15/15 0555  CREATININE 0.79 0.78 0.64 0.67 0.70   Estimated Creatinine Clearance: 126.4 mL/min (by C-G formula based on Cr of 0.7).    Allergies  Allergen Reactions  . Shellfish Allergy Anaphylaxis  . Shellfish Allergy Swelling    Crab legs, seafood     Antimicrobials this admission: Zosyn 1/21 >>  Levels/dose changes this admission: n/a  Microbiology results: 1/21 UCx: neg 1/21 BCx: ngtd 1/21 WCx: mod pseudomonas, pan sens 1/22 Cdiff: antigen and toxin neg  Thank you for allowing pharmacy to be a part of this patient's care.  Kacyn Souder D. Keva Darty, PharmD, BCPS Clinical Pharmacist Pager: [phone] 08/15/2015 4:29 PM

## 2015-08-15 NOTE — Consult Note (Signed)
WOC ostomy follow up Stoma type/location: RLQ, ileostomy Stomal assessment/size: no stoma, slit in skin Peristomal assessment: denudation that extends into the right groin and onto the right thigh Treatment options for stomal/peristomal skin: using ostomy pectin powder to treat denuded skin  Output high volumes, liquid like water, continuously, green  Ostomy pouching: 1pc convex used today Patient had been successful with this at home, however he had no skin irritation at that time.  He was managing with a convex 1pc at home.  During this admission he had a time period when he pouch was leaking and due to the acidity of the effluent the skin has become irritated and raw.  Unfortunately, the [facility] nurse has not been success with multiple different types of pouching systems including the use of suction to maintain a seal of his pouch greater than a few hours. He reports the evening nurse placed a convex one pc yesterday and it lasted through the night.   I have explained that he may have to change his pouch every 4 hours or so at home until the skin has improved, not sure he will be able to manage this intense care at home.  Education provided: [name] continues to seem that he does not understand tape and ostomy wafers will not stick to wet skin.  I have attempted multiple times to explain this to him.  When I arrived this am, the pouch was leaking. It does appear that it may have been leaking or at least the effluent has undermined the wafer earlier and with the amount of tape placed by the nurse it had not reached the point of leaking through the tape.  His skin has not improved, and continues to be denuded.   He would like to try the convex pouch again.  Today I have attempted to get the peristomal skin as dry as possible, crusted with ostomy powder.  I cut a 1pc convex ostomy pouch, attempted to build up the distal edge of the cut opening with extra skin barrier.  Placed pouch and window framed with tape  (Per the patient's request).  I have explained to the patient that he may have to change his pouch at home very frequently until his skin improves. However he is indigent (or was at his last admission) and obtaining excessive pouches may be difficult.  I will continue to follow closely.  The patient has extra supplies in the room.  No family at the bedside today.   Would most definitely suggest HHRN to continue to follow this patient at the time of DC.  Will also ask surgery about any options for thickening his output.  Raymond Ursin Eliberto Ivory RN,CWOCN [phone]

## 2015-08-15 NOTE — Clinical Social Work Note (Signed)
Clinical Social Work Assessment  Patient Details  Name: Raymond Bowen MRN: [id number] Date of Birth: Feb 19, 1976  Date of referral:  08/15/15               Reason for consult:  Facility Placement                Permission sought to share information with:  Facility Sport and exercise psychologist, Family Supports Permission granted to share information::  Yes, Verbal Permission Granted  Name::     Runner, broadcasting/film/video::  [facility] SN[facility]  Relationship::  Sister  Contact Information:  [phone]  Housing/Transportation Living arrangements for the past 2 months:  Single Family Home Source of Information:  Patient, Other (Comment Required) Engineer, petroleum ) Patient Interpreter Needed:  None Criminal Activity/Legal Involvement Pertinent to Current Situation/Hospitalization:  No - Comment as needed Significant Relationships:  Siblings, Other Family Members, Parents Lives with:  Self Do you feel safe going back to the place where you live?  No Need for family participation in patient care:  Yes (Comment)  Care giving concerns:  CSW received referral for possible SNF placement at time of discharge. CSW met with patient and patient's aunt regarding recommendation of SNF placement at time of discharge. Per patient's aunt, patient is currently unable to care for patient at home given patient's current physical needs. Patient and patient's aunt expressed understanding of PT recommendation and are agreeable to SNF placement at time of discharge. CSW to continue to follow and assist with discharge planning needs.   Social Worker assessment / plan:  CSW spoke with patient and patient's aunt concerning possibility of rehab at [facility] before returning home.  Employment status:  Disabled (Comment on whether or not currently receiving Disability) Insurance information:  Self Pay (Medicaid Pending) PT Recommendations:  Home with [place] / Referral to community resources:  [place]  Patient/Family's Response to care:  Patient recognizes need for rehab before returning home and is agreeable to a SNF in [facility].   Patient/Family's Understanding of and Emotional Response to Diagnosis, Current Treatment, and Prognosis:  Patient is realistic regarding therapy needs. No questions/concerns about plan or treatment.    Emotional Assessment Appearance:  Appears stated age Attitude/Demeanor/Rapport:   (Appropriate) Affect (typically observed):  Accepting, Appropriate Orientation:  Oriented to Self, Oriented to Place, Oriented to  Time, Oriented to Situation Alcohol / Substance use:  Not Applicable Psych involvement (Current and /or in the community):  No (Comment)  Discharge Needs  Concerns to be addressed:  Care Coordination Readmission within the last 30 days:  No Current discharge risk:  None Barriers to Discharge:  Continued Medical Work up   [employer], [place] 08/15/2015, 5:17 PM

## 2015-08-16 ENCOUNTER — Inpatient Hospital Stay (HOSPITAL_COMMUNITY): Payer: Self-pay

## 2015-08-16 DIAGNOSIS — I82511 Chronic embolism and thrombosis of right femoral vein: Secondary | ICD-10-CM

## 2015-08-16 DIAGNOSIS — R609 Edema, unspecified: Secondary | ICD-10-CM

## 2015-08-16 LAB — WOUND CULTURE

## 2015-08-16 MED ORDER — LOPERAMIDE HCL 2 MG PO CAPS
2.0000 mg | ORAL_CAPSULE | Freq: Four times a day (QID) | ORAL | Status: DC
  Administered 2015-08-16 – 2015-08-17 (×5): 2 mg via ORAL
  Filled 2015-08-16 (×5): qty 1

## 2015-08-16 NOTE — Discharge Instructions (Signed)
Information on my medicine - Pradaxa (dabigatran)  This medication education was reviewed with me or my healthcare representative as part of my discharge preparation. Why was Pradaxa prescribed for you? Pradaxa was prescribed to treat blood clots that may have been found in the veins of your legs (deep vein thrombosis) or in your lungs (pulmonary embolism) and to reduce the risk of them occurring again.   What do you Need to know about PradAXa? Take your Pradaxa TWICE DAILY - one capsule in the morning and one tablet in the evening with or without food.  It would be best to take the doses about the same time each day.  The capsules should not be broken, chewed or opened - they must be swallowed whole.  Do not store Pradaxa in other medication containers - once the bottle is opened the Pradaxa should be used within FOUR months; throw away any capsules that havent been by that time.  Take Pradaxa exactly as prescribed by your doctor.  DO NOT stop taking Pradaxa without talking to the doctor who prescribed the medication.  Refill your prescription before you run out.  After discharge, you should have regular check-up appointments with your healthcare provider that is prescribing your Pradaxa.  In the future your dose may need to be changed if your kidney function or weight changes by a significant amount.  What do you do if you miss a dose? If you miss a dose, take it as soon as you remember on the same day.  If your next dose is less than 6 hours away, skip the missed dose.  Do not take two doses of PRADAXA at the same time.  Important Safety Information A possible side effect of Pradaxa is bleeding. You should call your healthcare provider right away if you experience any of the following: ? Bleeding from an injury or your nose that does not stop. ? Unusual colored urine (red or dark brown) or unusual colored stools (red or black). ? Unusual bruising for unknown reasons. ? A serious  fall or if you hit your head (even if there is no bleeding).  Some medicines may interact with Pradaxa and might increase your risk of bleeding or clotting while on Pradaxa. To help avoid this, consult your healthcare provider or pharmacist prior to using any new prescription or non-prescription medications, including herbals, vitamins, non-steroidal anti-inflammatory drugs (NSAIDs) and supplements.  This website has more information on Pradaxa (dabigatran): www.HDTVGame.dk.
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
## 2015-08-16 NOTE — Consult Note (Addendum)
WOC ostomy follow up Stoma type/location: RLQ, ileostomy Stomal assessment/size: no stoma, slit in skin Peristomal assessment: denudation that extends into the right groin and onto the right thigh is slightly improved from previous assessment. Treatment options for stomal/peristomal skin: using ostomy pectin powder to treat denuded skin  Output high volumes, liquid like water, continuously, green  Ostomy pouching: 1pc convex used today with barrier ring to maintain seal Patient had been successful with this at home, however he had no skin irritation at that time.WOC nurse changed pouch yesterday afternoon and it is still intact, but was leaking behind the barrier and needs to be changed. I have explained that he may have to change his pouch every at home every time he feels itching or burning on his skin, as this is a symptom of leakage where nurses or the patient cannot see and will contribute to further peristomal irritation until the skin has improved. Education provided: Demonstrated pouch application with barrier ring to maintain seal and one piece convex pouch.  Window paned around the edges with pink tape to secure.  Applied ostomy power in a crusting technique to protect skin.  Pt watched process of pouching and asked appropriate questions.  Extra supplies ordered to bedside, as well as a belt if he desires to wear it for additional support. WOC team will continue to follow closely. No family at the bedside today.  Would most definitely suggest HHRN to continue to follow this patient at the time of DC. Cammie Mcgee MSN, RN, CWOCN, [place], CNS [phone]
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
## 2015-08-16 NOTE — Progress Notes (Signed)
*  PRELIMINARY RESULTS* Vascular Ultrasound Lower extremity venous duplex has been completed.  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 No DVT LLE. Patient has Hx acute DVT 04/2015.   Farrel Demark, RDMS, RVT  08/16/2015, 1:50 PM

## 2015-08-16 NOTE — Progress Notes (Signed)
CSW has been unable to find placement for pt- discussed case further with director- pt does not meet criteria for skilled care and we can not place him under LOG if he does not meet criteria for skilled care under Medicaid  RNCM informed- CSW signing off  Merlyn Lot, [facility] Clinical Social Worker [phone]

## 2015-08-17 LAB — CULTURE, BLOOD (ROUTINE X 2)
Culture: NO GROWTH
Culture: NO GROWTH

## 2015-08-17 MED ORDER — ONDANSETRON HCL 4 MG PO TABS: 4.0000 mg | ORAL_TABLET | Freq: Three times a day (TID) | ORAL | Status: DC | PRN

## 2015-08-17 MED ORDER — OXYCODONE HCL 10 MG PO TABS: 10.0000 mg | ORAL_TABLET | Freq: Three times a day (TID) | ORAL | Status: DC | PRN

## 2015-08-17 MED ORDER — LORAZEPAM 1 MG PO TABS: 1.0000 mg | ORAL_TABLET | Freq: Two times a day (BID) | ORAL | Status: DC | PRN

## 2015-08-17 MED ORDER — TRAZODONE HCL 50 MG PO TABS: 50.0000 mg | ORAL_TABLET | Freq: Every day | ORAL | Status: DC

## 2015-08-17 MED ORDER — LOPERAMIDE HCL 2 MG PO CAPS: 2.0000 mg | ORAL_CAPSULE | Freq: Four times a day (QID) | ORAL | Status: DC

## 2015-08-17 MED FILL — LORazepam 1 MG TABS: 1 | 15 days supply | Qty: 30 | Fill #0

## 2015-08-17 MED FILL — traZODone HCL 50 MG TABS: 50 | 30 days supply | Qty: 30 | Fill #0

## 2015-08-17 MED FILL — oxyCODONE HCL 10 MG TABS: 10 | 10 days supply | Qty: 30 | Fill #0

## 2015-08-17 MED FILL — ONDANSETRON HCL 4 MG TABLET: 4 | 10 days supply | Qty: 30 | Fill #0

## 2015-08-17 NOTE — Progress Notes (Signed)
Pts girlfriend in room assisting pt with changing Ileostomy pouch.

## 2015-08-17 NOTE — Progress Notes (Signed)
Raymond Bowen discharged Home with mother per MD order.  Discharge instructions reviewed and discussed with the patient & mother, all questions and concerns answered. Copy of instructions, care notes for new medication & diagnosis and scripts given to patient.    Medication List    STOP taking these medications        simethicone 40 MG/0.6ML drops  Commonly known as:  MYLICON      TAKE these medications        dabigatran 150 MG Caps capsule  Commonly known as:  PRADAXA  Take 1 capsule (150 mg total) by mouth every 12 (twelve) hours.     fentaNYL 50 MCG/HR  Commonly known as:  DURAGESIC - dosed mcg/hr  Place 1 patch (50 mcg total) onto the skin every 3 (three) days. Use patches for next two weeks. When these are used up start taper to taper to 25 mg patches.     gabapentin 300 MG capsule  Commonly known as:  NEURONTIN  Take 1 capsule (300 mg total) by mouth 3 (three) times daily.     guaiFENesin 600 MG 12 hr tablet  Commonly known as:  MUCINEX  Take 2 tablets (1,200 mg total) by mouth 2 (two) times daily as needed for cough or to loosen phlegm.     loperamide 2 MG capsule  Commonly known as:  IMODIUM  Take 1 capsule (2 mg total) by mouth 4 (four) times daily.     LORazepam 1 MG tablet  Commonly known as:  ATIVAN  Take 1 tablet (1 mg total) by mouth 2 (two) times daily as needed for anxiety.     megestrol 400 MG/10ML suspension  Commonly known as:  MEGACE  Take 10 mLs (400 mg total) by mouth 2 (two) times daily.     metoCLOPramide 10 MG tablet  Commonly known as:  REGLAN  Take 1 tablet (10 mg total) by mouth 3 (three) times daily before meals.     ondansetron 4 MG tablet  Commonly known as:  ZOFRAN  Take 1 tablet (4 mg total) by mouth every 8 (eight) hours as needed for nausea or vomiting.     Oxycodone HCl 10 MG Tabs  Take 1 tablet (10 mg total) by mouth every 8 (eight) hours as needed.     pantoprazole 40 MG tablet  Commonly known as:  PROTONIX  Take 1 tablet  (40 mg total) by mouth 2 (two) times daily.     promethazine 25 MG suppository  Commonly known as:  PHENERGAN  Place 1 suppository (25 mg total) rectally every 6 (six) hours as needed for nausea or vomiting.     sucralfate 1 GM/10ML suspension  Commonly known as:  CARAFATE  Take 10 mLs (1 g total) by mouth 4 (four) times daily -  with meals and at bedtime.     traZODone 50 MG tablet  Commonly known as:  DESYREL  Take 1 tablet (50 mg total) by mouth at bedtime. For sleep        Patients skin is clean, dry and intact, no evidence of skin break down. IV site discontinued and catheter remains intact. Site without signs and symptoms of complications. Dressing and pressure applied.  Patient escorted to car by volunteer in a wheelchair,  no distress noted upon discharge.  [name] C 08/17/2015 2:26 PM

## 2015-08-17 NOTE — Consult Note (Signed)
WOC contacted bedside nurse to see how night went with ostomy pouch, apparently leaking this am and family changed pouch prior to bedside nurse coming to assist.  I will check in with patient and family later today to determine if the patient can manage pouch changes that will need to be frequent at home due to irritated skin.  I will also assess skin right groin and right thigh to see if improved.  Trace Wirick Revere RN,CWOCN [phone]

## 2015-08-17 NOTE — Consult Note (Signed)
WOC stopped by for a visit with patient.  He is planned for DC today back to home, will have [facility] per patient again to start back tomorrow.  He is still having difficulty with management of his ostomy due to the skin irritation. He and his girlfriend have changed the pouch today.  Patient can manage pouch changes however he still is a little "slow" to understand some of my education with him.  I have demonstrated the pouch application process that has worked the most successfully while he has been inpatient.  By using the strips of duradhesive product on the right side of the wafer.  I have provided him with 5 convex 1pc pouches, ostomy powder to treat the skin, duradhesive product to cut into strips for use, a belt and demonstrated how to use the belt.  3 extra barrier rings (just in case).  I reeducated him on the use of the barrier cream on the skin on his thigh and groin to treat the irritation, but made sure the patient understands he can not put the ointment on the skin under the ostomy wafer or the pouch will not stick.  The skin on his right thigh and in the right groin are improved and do not appear raw any longer, 1 tube of barrier cream sent home with the patient as well.   Davina Poke RN, CWOCN [phone]
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
## 2015-08-18 NOTE — Progress Notes (Signed)
Spoke with Adiie RN for Dr Jacolyn Reedy [phone]. She states that oatient missed an appointment scheduled for yesterday. CM explained that patient was just discharged from the hospital yesterday and would not have been able to go to that appointment. CM asked Addie if they would be able to take the patient. She stated "I don't see why not." CM attempted to schedule follow up appointment but front desk did not answer phone at 11:42, office closes at 12:00. CM left message to be called back. Spoke with Marcelino Duster at [facility] [phone]. She had stated that patient would not be accepted for home health because he did not have a PCP. CM then verified PCP as noted above. CM updated Marcelino Duster that patient was accepted by Dr. Hyacinth Meeker, and that home health case was accepted with a start date for Friday prior to patient being discharged from the hospital. Marcelino Duster stated that if she could not get Dr Hyacinth Meeker to sign orders she would attempt with Dr Lindie Spruce (surgery at [facility]).

## 2015-08-27 ENCOUNTER — Emergency Department (HOSPITAL_COMMUNITY): Payer: Self-pay

## 2015-08-27 ENCOUNTER — Emergency Department (HOSPITAL_COMMUNITY)
Admission: EM | Admit: 2015-08-27 | Discharge: 2015-08-27 | Disposition: A | Discharge status: Home / Self Care | Payer: Self-pay | Attending: Emergency Medicine | Admitting: Emergency Medicine

## 2015-08-27 ENCOUNTER — Encounter (HOSPITAL_COMMUNITY): Payer: Self-pay | Admitting: Emergency Medicine

## 2015-08-27 DIAGNOSIS — R109 Unspecified abdominal pain: Secondary | ICD-10-CM

## 2015-08-27 DIAGNOSIS — R110 Nausea: Secondary | ICD-10-CM | POA: Insufficient documentation

## 2015-08-27 DIAGNOSIS — Z79899 Other long term (current) drug therapy: Secondary | ICD-10-CM | POA: Insufficient documentation

## 2015-08-27 DIAGNOSIS — M79604 Pain in right leg: Secondary | ICD-10-CM | POA: Insufficient documentation

## 2015-08-27 DIAGNOSIS — Z79891 Long term (current) use of opiate analgesic: Secondary | ICD-10-CM | POA: Insufficient documentation

## 2015-08-27 DIAGNOSIS — E872 Acidosis, unspecified: Secondary | ICD-10-CM

## 2015-08-27 DIAGNOSIS — D649 Anemia, unspecified: Secondary | ICD-10-CM | POA: Insufficient documentation

## 2015-08-27 DIAGNOSIS — Z87828 Personal history of other (healed) physical injury and trauma: Secondary | ICD-10-CM | POA: Insufficient documentation

## 2015-08-27 DIAGNOSIS — F17210 Nicotine dependence, cigarettes, uncomplicated: Secondary | ICD-10-CM | POA: Insufficient documentation

## 2015-08-27 DIAGNOSIS — D72829 Elevated white blood cell count, unspecified: Secondary | ICD-10-CM | POA: Insufficient documentation

## 2015-08-27 LAB — DIFFERENTIAL
Basophils Absolute: 0 10*3/uL (ref 0.0–0.1)
Basophils Relative: 0 %
Eosinophils Absolute: 0.4 10*3/uL (ref 0.0–0.7)
Eosinophils Relative: 2 %
Lymphocytes Relative: 32 %
Lymphs Abs: 5.8 10*3/uL — ABNORMAL HIGH (ref 0.7–4.0)
Monocytes Absolute: 1.2 10*3/uL — ABNORMAL HIGH (ref 0.1–1.0)
Monocytes Relative: 6 %
Neutro Abs: 10.8 10*3/uL — ABNORMAL HIGH (ref 1.7–7.7)
Neutrophils Relative %: 59 %

## 2015-08-27 LAB — COMPREHENSIVE METABOLIC PANEL
ALT: 37 U/L (ref 17–63)
AST: 17 U/L (ref 15–41)
Albumin: 3.6 g/dL (ref 3.5–5.0)
Alkaline Phosphatase: 111 U/L (ref 38–126)
Anion gap: 10 (ref 5–15)
BUN: 19 mg/dL (ref 6–20)
CO2: 19 mmol/L — ABNORMAL LOW (ref 22–32)
Calcium: 10.8 mg/dL — ABNORMAL HIGH (ref 8.9–10.3)
Chloride: 111 mmol/L (ref 101–111)
Creatinine, Ser: 0.82 mg/dL (ref 0.61–1.24)
GFR calc Af Amer: >60 mL/min (ref >60)
GFR calc non Af Amer: >60 mL/min (ref >60)
Glucose, Bld: 86 mg/dL (ref 65–99)
Potassium: 3.9 mmol/L (ref 3.5–5.1)
Sodium: 140 mmol/L (ref 135–145)
Total Bilirubin: 0.6 mg/dL (ref 0.3–1.2)
Total Protein: 7.4 g/dL (ref 6.5–8.1)

## 2015-08-27 LAB — URINALYSIS, ROUTINE W REFLEX MICROSCOPIC
Bilirubin Urine: NEGATIVE
Glucose, UA: NEGATIVE mg/dL
Ketones, ur: NEGATIVE mg/dL
Leukocytes, UA: NEGATIVE
Nitrite: NEGATIVE
Protein, ur: NEGATIVE mg/dL
Specific Gravity, Urine: 1.024 (ref 1.005–1.030)
pH: 5.5 (ref 5.0–8.0)

## 2015-08-27 LAB — I-STAT CG4 LACTIC ACID, ED: Lactic Acid, Venous: 1.77 mmol/L (ref 0.5–2.0)

## 2015-08-27 LAB — CBC
HCT: 33.9 % — ABNORMAL LOW (ref 39.0–52.0)
Hemoglobin: 11.2 g/dL — ABNORMAL LOW (ref 13.0–17.0)
MCH: 29.3 pg (ref 26.0–34.0)
MCHC: 33 g/dL (ref 30.0–36.0)
MCV: 88.7 fL (ref 78.0–100.0)
Platelets: 385 10*3/uL (ref 150–400)
RBC: 3.82 MIL/uL — ABNORMAL LOW (ref 4.22–5.81)
RDW: 17 % — ABNORMAL HIGH (ref 11.5–15.5)
WBC: 18 10*3/uL — ABNORMAL HIGH (ref 4.0–10.5)

## 2015-08-27 LAB — URINE MICROSCOPIC-ADD ON

## 2015-08-27 LAB — LIPASE, BLOOD: Lipase: 31 U/L (ref 11–51)

## 2015-08-27 MED ORDER — SODIUM CHLORIDE 0.9 % IV SOLN
1000.0000 mL | INTRAVENOUS | Status: DC
  Administered 2015-08-27: 1000 mL via INTRAVENOUS

## 2015-08-27 MED ORDER — HYDROMORPHONE HCL 1 MG/ML IJ SOLN
1.0000 mg | Freq: Once | INTRAMUSCULAR | Status: AC
  Administered 2015-08-27: 1 mg via INTRAVENOUS
  Filled 2015-08-27: qty 1

## 2015-08-27 MED ORDER — SODIUM CHLORIDE 0.9 % IV SOLN
1000.0000 mL | Freq: Once | INTRAVENOUS | Status: AC
  Administered 2015-08-27: 1000 mL via INTRAVENOUS

## 2015-08-27 MED ORDER — METOCLOPRAMIDE HCL 10 MG PO TABS: 10.0000 mg | ORAL_TABLET | Freq: Three times a day (TID) | ORAL | Status: DC

## 2015-08-27 MED ORDER — ONDANSETRON HCL 4 MG/2ML IJ SOLN
4.0000 mg | Freq: Once | INTRAMUSCULAR | Status: AC
  Administered 2015-08-27: 4 mg via INTRAVENOUS
  Filled 2015-08-27: qty 2

## 2015-08-27 MED ORDER — IOHEXOL 300 MG/ML  SOLN
100.0000 mL | Freq: Once | INTRAMUSCULAR | Status: AC | PRN
  Administered 2015-08-27: 100 mL via INTRAVENOUS

## 2015-08-27 MED ORDER — FENTANYL CITRATE (PF) 100 MCG/2ML IJ SOLN
50.0000 ug | Freq: Once | INTRAMUSCULAR | Status: AC
  Administered 2015-08-27: 50 ug via INTRAVENOUS
  Filled 2015-08-27: qty 2

## 2015-08-27 MED ORDER — HYDROMORPHONE HCL 1 MG/ML IJ SOLN
1.0000 mg | Freq: Once | INTRAMUSCULAR | Status: AC
Start: 2015-08-27 — End: 2015-08-27
  Administered 2015-08-27: 1 mg via INTRAVENOUS
  Filled 2015-08-27: qty 1

## 2015-08-27 MED ORDER — HYDROMORPHONE HCL 2 MG PO TABS: 2.0000 mg | ORAL_TABLET | ORAL | Status: DC | PRN
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
## 2015-08-27 NOTE — Discharge Instructions (Signed)
Abdominal Pain, Adult Many things can cause abdominal pain. Usually, abdominal pain is not caused by a disease and will improve without treatment. It can often be observed and treated at home. Your health care provider will do a physical exam and possibly order blood tests and X-rays to help determine the seriousness of your pain. However, in many cases, more time must pass before a clear cause of the pain can be found. Before that point, your health care provider may not know if you need more testing or further treatment. HOME CARE INSTRUCTIONS Monitor your abdominal pain for any changes. The following actions may help to alleviate any discomfort you are experiencing:  Only take over-the-counter or prescription medicines as directed by your health care provider.  Do not take laxatives unless directed to do so by your health care provider.  Try a clear liquid diet (broth, tea, or water) as directed by your health care provider. Slowly move to a bland diet as tolerated. SEEK MEDICAL CARE IF:  You have unexplained abdominal pain.  You have abdominal pain associated with nausea or diarrhea.  You have pain when you urinate or have a bowel movement.  You experience abdominal pain that wakes you in the night.  You have abdominal pain that is worsened or improved by eating food.  You have abdominal pain that is worsened with eating fatty foods.  You have a fever. SEEK IMMEDIATE MEDICAL CARE IF:  Your pain does not go away within 2 hours.  You keep throwing up (vomiting).  Your pain is felt only in portions of the abdomen, such as the right side or the left lower portion of the abdomen.  You pass bloody or black tarry stools. MAKE SURE YOU:  Understand these instructions.  Will watch your condition.  Will get help right away if you are not doing well or get worse.   This information is not intended to replace advice given to you by your health care provider. Make sure you discuss  any questions you have with your health care provider.   Document Released: 04/17/2005 Document Revised: 03/29/2015 Document Reviewed: 03/17/2013 Elsevier Interactive Patient Education 2016 [employer]  Hydromorphone tablets What is this medicine? HYDROMORPHONE (hye droe MOR fone) is a pain reliever. It is used to treat moderate to severe pain. This medicine may be used for other purposes; ask your health care provider or pharmacist if you have questions. What should I tell my health care provider before I take this medicine? They need to know if you have any of these conditions: -brain tumor -drug abuse or addiction -head injury -heart disease -frequently drink alcohol containing drinks -kidney disease or problems going to the bathroom -liver disease -lung disease, asthma, or breathing problems -mental problems -an allergic or unusual reaction to lactose, hydromorphone, other opioid analgesics, other medicines, sulfites, foods, dyes, or preservatives -pregnant or trying to get pregnant -breast-feeding How should I use this medicine? Take this medicine by mouth with a glass of water. If the medicine upsets your stomach, take it with food or milk. Follow the directions on the prescription label. Do not take more medicine than you are told to take. Talk to your pediatrician regarding the use of this medicine in children. Special care may be needed. Overdosage: If you think you have taken too much of this medicine contact a poison control center or emergency room at once. NOTE: This medicine is only for you. Do not share this medicine with others. What if I miss  a dose? If you miss a dose, take it as soon as you can. If it is almost time for your next dose, take only that dose. Do not take double or extra doses. What may interact with this medicine? -alcohol -antihistamines for allergy, cough and cold -medicines for anesthesia -medicines for depression, anxiety, or psychotic  disturbances -medicines for sleep -muscle relaxants -naltrexone -narcotic medicines (opiates) for pain -phenothiazines like chlorpromazine, mesoridazine, prochlorperazine, thioridazine -tramadol This list may not describe all possible interactions. Give your health care provider a list of all the medicines, herbs, non-prescription drugs, or dietary supplements you use. Also tell them if you smoke, drink alcohol, or use illegal drugs. Some items may interact with your medicine. What should I watch for while using this medicine? Tell your doctor or health care professional if your pain does not go away, if it gets worse, or if you have new or a different type of pain. You may develop tolerance to the medicine. Tolerance means that you will need a higher dose of the medicine for pain relief. Tolerance is normal and is expected if you take this medicine for a long time. Do not suddenly stop taking your medicine because you may develop a severe reaction. Your body becomes used to the medicine. This does NOT mean you are addicted. Addiction is a behavior related to getting and using a drug for a non-medical reason. If you have pain, you have a medical reason to take pain medicine. Your doctor will tell you how much medicine to take. If your doctor wants you to stop the medicine, the dose will be slowly lowered over time to avoid any side effects. You may get drowsy or dizzy. Do not drive, use machinery, or do anything that needs mental alertness until you know how this medicine affects you. Do not stand or sit up quickly, especially if you are an older patient. This reduces the risk of dizzy or fainting spells. Alcohol may interfere with the effect of this medicine. Avoid alcoholic drinks. There are different types of narcotic medicines (opiates) for pain. If you take more than one type at the same time, you may have more side effects. Give your health care provider a list of all medicines you use. Your doctor  will tell you how much medicine to take. Do not take more medicine than directed. Call emergency for help if you have problems breathing. This medicine will cause constipation. Try to have a bowel movement at least every 2 to 3 days. If you do not have a bowel movement for 3 days, call your doctor or health care professional. Your mouth may get dry. Chewing sugarless gum or sucking hard candy, and drinking plenty of water may help. Contact your doctor if the problem does not go away or is severe. What side effects may I notice from receiving this medicine? Side effects that you should report to your doctor or health care professional as soon as possible: -allergic reactions like skin rash, itching or hives, swelling of the face, lips, or tongue -breathing problems -changes in vision -confusion -feeling faint or lightheaded, falls -seizures -slow or fast heartbeat -trouble passing urine or change in the amount of urine -trouble with balance, talking, walking -unusually weak or tired Side effects that usually do not require medical attention (report to your doctor or health care professional if they continue or are bothersome): -difficulty sleeping -drowsiness -dry mouth -flushing -headache -itching -loss of appetite -nausea, vomiting This list may not describe all possible  side effects. Call your doctor for medical advice about side effects. You may report side effects to FDA at 1-800-FDA-1088. Where should I keep my medicine? Keep out of the reach of children. This medicine can be abused. Keep your medicine in a safe place to protect it from theft. Do not share this medicine with anyone. Selling or giving away this medicine is dangerous and against the law. Store at room temperature between 15 and 30 degrees C (59 and 86 degrees F). Keep container tightly closed. Protect from light. This medicine may cause accidental overdose and death if it is taken by other adults, children, or pets.  Flush any unused medicine down the toilet to reduce the chance of harm. Do not use the medicine after the expiration date. NOTE: This sheet is a summary. It may not cover all possible information. If you have questions about this medicine, talk to your doctor, pharmacist, or health care provider.    2016, [employer] Standard. (2013-02-09 09:50:15)  Metoclopramide tablets What is this medicine? METOCLOPRAMIDE (met oh kloe PRA mide) is used to treat the symptoms of gastroesophageal reflux disease (GERD) like heartburn. It is also used to treat people with slow emptying of the stomach and intestinal tract. This medicine may be used for other purposes; ask your health care provider or pharmacist if you have questions. What should I tell my health care provider before I take this medicine? They need to know if you have any of these conditions: -breast cancer -depression -diabetes -heart failure -high blood pressure -kidney disease -liver disease -Parkinson's disease or a movement disorder -pheochromocytoma -seizures -stomach obstruction, bleeding, or perforation -an unusual or allergic reaction to metoclopramide, procainamide, sulfites, other medicines, foods, dyes, or preservatives -pregnant or trying to get pregnant -breast-feeding How should I use this medicine? Take this medicine by mouth with a glass of water. Follow the directions on the prescription label. Take this medicine on an empty stomach, about 30 minutes before eating. Take your doses at regular intervals. Do not take your medicine more often than directed. Do not stop taking except on the advice of your doctor or health care professional. A special MedGuide will be given to you by the pharmacist with each prescription and refill. Be sure to read this information carefully each time. Talk to your pediatrician regarding the use of this medicine in children. Special care may be needed. Overdosage: If you think you have taken  too much of this medicine contact a poison control center or emergency room at once. NOTE: This medicine is only for you. Do not share this medicine with others. What if I miss a dose? If you miss a dose, take it as soon as you can. If it is almost time for your next dose, take only that dose. Do not take double or extra doses. What may interact with this medicine? -acetaminophen -cyclosporine -digoxin -medicines for blood pressure -medicines for diabetes, including insulin -medicines for hay fever and other allergies -medicines for depression, especially an Monoamine Oxidase Inhibitor (MAOI) -medicines for Parkinson's disease, like levodopa -medicines for sleep or for pain -tetracycline This list may not describe all possible interactions. Give your health care provider a list of all the medicines, herbs, non-prescription drugs, or dietary supplements you use. Also tell them if you smoke, drink alcohol, or use illegal drugs. Some items may interact with your medicine. What should I watch for while using this medicine? It may take a few weeks for your stomach condition to start to get  better. However, do not take this medicine for longer than 12 weeks. The longer you take this medicine, and the more you take it, the greater your chances are of developing serious side effects. If you are an elderly patient, a male patient, or you have diabetes, you may be at an increased risk for side effects from this medicine. Contact your doctor immediately if you start having movements you cannot control such as lip smacking, rapid movements of the tongue, involuntary or uncontrollable movements of the eyes, head, arms and legs, or muscle twitches and spasms. Patients and their families should watch out for worsening depression or thoughts of suicide. Also watch out for any sudden or severe changes in feelings such as feeling anxious, agitated, panicky, irritable, hostile, aggressive, impulsive, severely  restless, overly excited and hyperactive, or not being able to sleep. If this happens, especially at the beginning of treatment or after a change in dose, call your doctor. Do not treat yourself for high fever. Ask your doctor or health care professional for advice. You may get drowsy or dizzy. Do not drive, use machinery, or do anything that needs mental alertness until you know how this drug affects you. Do not stand or sit up quickly, especially if you are an older patient. This reduces the risk of dizzy or fainting spells. Alcohol can make you more drowsy and dizzy. Avoid alcoholic drinks. What side effects may I notice from receiving this medicine? Side effects that you should report to your doctor or health care professional as soon as possible: -allergic reactions like skin rash, itching or hives, swelling of the face, lips, or tongue -abnormal production of milk in females -breast enlargement in both males and females -change in the way you walk -difficulty moving, speaking or swallowing -drooling, lip smacking, or rapid movements of the tongue -excessive sweating -fever -involuntary or uncontrollable movements of the eyes, head, arms and legs -irregular heartbeat or palpitations -muscle twitches and spasms -unusually weak or tired Side effects that usually do not require medical attention (report to your doctor or health care professional if they continue or are bothersome): -change in sex drive or performance -depressed mood -diarrhea -difficulty sleeping -headache -menstrual changes -restless or nervous This list may not describe all possible side effects. Call your doctor for medical advice about side effects. You may report side effects to FDA at 1-800-FDA-1088. Where should I keep my medicine? Keep out of the reach of children. Store at room temperature between 20 and 25 degrees C (68 and 77 degrees F). Protect from light. Keep container tightly closed. Throw away any unused  medicine after the expiration date. NOTE: This sheet is a summary. It may not cover all possible information. If you have questions about this medicine, talk to your doctor, pharmacist, or health care provider.    2016, [employer] Standard. (2011-11-05 13:04:38)

## 2015-08-27 NOTE — ED Notes (Signed)
Helped pt change colostomy bag, placed pt into paper scrubs.
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
## 2015-09-11 ENCOUNTER — Encounter (HOSPITAL_COMMUNITY): Payer: Self-pay | Admitting: Emergency Medicine

## 2015-09-11 ENCOUNTER — Emergency Department (HOSPITAL_COMMUNITY)
Admission: EM | Admit: 2015-09-11 | Discharge: 2015-09-11 | Disposition: A | Discharge status: Home / Self Care | Payer: Self-pay | Attending: Emergency Medicine | Admitting: Emergency Medicine

## 2015-09-11 ENCOUNTER — Emergency Department (HOSPITAL_BASED_OUTPATIENT_CLINIC_OR_DEPARTMENT_OTHER): Payer: Self-pay

## 2015-09-11 DIAGNOSIS — M79609 Pain in unspecified limb: Secondary | ICD-10-CM

## 2015-09-11 DIAGNOSIS — Z87828 Personal history of other (healed) physical injury and trauma: Secondary | ICD-10-CM | POA: Insufficient documentation

## 2015-09-11 DIAGNOSIS — Z933 Colostomy status: Secondary | ICD-10-CM | POA: Insufficient documentation

## 2015-09-11 DIAGNOSIS — M7989 Other specified soft tissue disorders: Secondary | ICD-10-CM | POA: Insufficient documentation

## 2015-09-11 DIAGNOSIS — R109 Unspecified abdominal pain: Secondary | ICD-10-CM | POA: Insufficient documentation

## 2015-09-11 DIAGNOSIS — Z79899 Other long term (current) drug therapy: Secondary | ICD-10-CM | POA: Insufficient documentation

## 2015-09-11 DIAGNOSIS — Z86718 Personal history of other venous thrombosis and embolism: Secondary | ICD-10-CM | POA: Insufficient documentation

## 2015-09-11 DIAGNOSIS — F17210 Nicotine dependence, cigarettes, uncomplicated: Secondary | ICD-10-CM | POA: Insufficient documentation

## 2015-09-11 LAB — URINALYSIS, ROUTINE W REFLEX MICROSCOPIC
Bilirubin Urine: NEGATIVE
Glucose, UA: NEGATIVE mg/dL
Ketones, ur: NEGATIVE mg/dL
Leukocytes, UA: NEGATIVE
Nitrite: NEGATIVE
Protein, ur: 30 mg/dL — AB
Specific Gravity, Urine: 1.029 (ref 1.005–1.030)
pH: 5.5 (ref 5.0–8.0)

## 2015-09-11 LAB — COMPREHENSIVE METABOLIC PANEL
ALT: 42 U/L (ref 17–63)
AST: 26 U/L (ref 15–41)
Albumin: 3.1 g/dL — ABNORMAL LOW (ref 3.5–5.0)
Alkaline Phosphatase: 113 U/L (ref 38–126)
Anion gap: 12 (ref 5–15)
BUN: 15 mg/dL (ref 6–20)
CO2: 17 mmol/L — ABNORMAL LOW (ref 22–32)
Calcium: 10.3 mg/dL (ref 8.9–10.3)
Chloride: 112 mmol/L — ABNORMAL HIGH (ref 101–111)
Creatinine, Ser: 0.82 mg/dL (ref 0.61–1.24)
GFR calc Af Amer: >60 mL/min (ref >60)
GFR calc non Af Amer: >60 mL/min (ref >60)
Glucose, Bld: 92 mg/dL (ref 65–99)
Potassium: 3.6 mmol/L (ref 3.5–5.1)
Sodium: 141 mmol/L (ref 135–145)
Total Bilirubin: 0.3 mg/dL (ref 0.3–1.2)
Total Protein: 7.4 g/dL (ref 6.5–8.1)

## 2015-09-11 LAB — CBC
HCT: 32 % — ABNORMAL LOW (ref 39.0–52.0)
Hemoglobin: 10.2 g/dL — ABNORMAL LOW (ref 13.0–17.0)
MCH: 27.7 pg (ref 26.0–34.0)
MCHC: 31.9 g/dL (ref 30.0–36.0)
MCV: 87 fL (ref 78.0–100.0)
Platelets: 436 K/uL — ABNORMAL HIGH (ref 150–400)
RBC: 3.68 MIL/uL — ABNORMAL LOW (ref 4.22–5.81)
RDW: 16.4 % — ABNORMAL HIGH (ref 11.5–15.5)
WBC: 15.4 K/uL — ABNORMAL HIGH (ref 4.0–10.5)

## 2015-09-11 LAB — URINE MICROSCOPIC-ADD ON

## 2015-09-11 LAB — LIPASE, BLOOD: Lipase: 22 U/L (ref 11–51)

## 2015-09-11 MED ORDER — HYDROMORPHONE HCL 1 MG/ML IJ SOLN
2.0000 mg | Freq: Once | INTRAMUSCULAR | Status: AC
  Administered 2015-09-11: 2 mg via INTRAMUSCULAR
  Filled 2015-09-11: qty 2

## 2015-09-11 MED ORDER — XARELTO VTE STARTER PACK 15 & 20 MG PO TBPK: 15.0000 mg | ORAL_TABLET | ORAL | Status: DC

## 2015-09-11 MED ORDER — HYDROMORPHONE HCL 1 MG/ML IJ SOLN
1.0000 mg | Freq: Once | INTRAMUSCULAR | Status: DC
  Filled 2015-09-11: qty 1

## 2015-09-11 MED ORDER — HYDROMORPHONE HCL 1 MG/ML IJ SOLN
1.0000 mg | Freq: Once | INTRAMUSCULAR | Status: AC
  Administered 2015-09-11: 1 mg via INTRAVENOUS
  Filled 2015-09-11: qty 1

## 2015-09-11 MED FILL — XARELTO STARTER PACK: 15 & 20 | 28 days supply | Qty: 51 | Fill #0

## 2015-09-11 NOTE — ED Notes (Signed)
Pt in Vascular lab.

## 2015-09-11 NOTE — ED Notes (Signed)
Pt. reports mid abdominal pain onset this evening , denies nausea or vomitting , no fever or chills.
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
## 2015-09-11 NOTE — ED Notes (Signed)
Pt unable to void at this time. 

## 2015-09-11 NOTE — Progress Notes (Addendum)
VASCULAR LAB PRELIMINARY  PRELIMINARY  PRELIMINARY  PRELIMINARY  Right lower extremity venous duplex completed.    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 There is a chronic thrombus in the saphenofemoral junction as noted in the previous exam  No evidence of superficial thrombosis.  No Baker's cyst.  Raymond Bowen, RVS 09/11/2015, 9:32 AM

## 2015-09-11 NOTE — ED Notes (Signed)
Lunch tray ordered for pt.

## 2015-09-11 NOTE — Discharge Instructions (Signed)
You may start taking your Xarelto as prescribed. Do not take your Pradaxa anymore. Follow-up with your doctor in the next 2 days for reevaluation. You may also call vascular pain specialists and [place] set up an evaluation for your chronic blood clots in your legs. Return to ED for new or worsening symptoms such as fever, worsening pain, chest pain, shortness of breath or cough with blood.

## 2015-09-18 ENCOUNTER — Encounter (HOSPITAL_COMMUNITY): Payer: Self-pay | Admitting: Emergency Medicine

## 2015-09-18 ENCOUNTER — Emergency Department (HOSPITAL_COMMUNITY)
Admission: EM | Admit: 2015-09-18 | Discharge: 2015-09-19 | Disposition: A | Discharge status: Home / Self Care | Payer: Self-pay | Attending: Emergency Medicine | Admitting: Emergency Medicine

## 2015-09-18 DIAGNOSIS — Z79899 Other long term (current) drug therapy: Secondary | ICD-10-CM | POA: Insufficient documentation

## 2015-09-18 DIAGNOSIS — R1084 Generalized abdominal pain: Secondary | ICD-10-CM | POA: Insufficient documentation

## 2015-09-18 DIAGNOSIS — Z87891 Personal history of nicotine dependence: Secondary | ICD-10-CM | POA: Insufficient documentation

## 2015-09-18 DIAGNOSIS — R000 Tachycardia, unspecified: Secondary | ICD-10-CM | POA: Insufficient documentation

## 2015-09-18 DIAGNOSIS — Z87828 Personal history of other (healed) physical injury and trauma: Secondary | ICD-10-CM | POA: Insufficient documentation

## 2015-09-18 DIAGNOSIS — R112 Nausea with vomiting, unspecified: Secondary | ICD-10-CM | POA: Insufficient documentation

## 2015-09-18 LAB — COMPREHENSIVE METABOLIC PANEL
ALT: 47 U/L (ref 17–63)
AST: 24 U/L (ref 15–41)
Albumin: 3.7 g/dL (ref 3.5–5.0)
Alkaline Phosphatase: 137 U/L — ABNORMAL HIGH (ref 38–126)
Anion gap: 14 (ref 5–15)
BUN: 15 mg/dL (ref 6–20)
CO2: 21 mmol/L — ABNORMAL LOW (ref 22–32)
Calcium: 12.1 mg/dL — ABNORMAL HIGH (ref 8.9–10.3)
Chloride: 105 mmol/L (ref 101–111)
Creatinine, Ser: 1.01 mg/dL (ref 0.61–1.24)
GFR calc Af Amer: >60 mL/min (ref >60)
GFR calc non Af Amer: >60 mL/min (ref >60)
Glucose, Bld: 114 mg/dL — ABNORMAL HIGH (ref 65–99)
Potassium: 3.3 mmol/L — ABNORMAL LOW (ref 3.5–5.1)
Sodium: 140 mmol/L (ref 135–145)
Total Bilirubin: 0.3 mg/dL (ref 0.3–1.2)
Total Protein: 8.4 g/dL — ABNORMAL HIGH (ref 6.5–8.1)

## 2015-09-18 LAB — CBC
HCT: 36.4 % — ABNORMAL LOW (ref 39.0–52.0)
Hemoglobin: 12.3 g/dL — ABNORMAL LOW (ref 13.0–17.0)
MCH: 28.6 pg (ref 26.0–34.0)
MCHC: 33.8 g/dL (ref 30.0–36.0)
MCV: 84.7 fL (ref 78.0–100.0)
Platelets: 398 10*3/uL (ref 150–400)
RBC: 4.3 MIL/uL (ref 4.22–5.81)
RDW: 15.4 % (ref 11.5–15.5)
WBC: 17.9 10*3/uL — ABNORMAL HIGH (ref 4.0–10.5)

## 2015-09-18 LAB — LIPASE, BLOOD: Lipase: 35 U/L (ref 11–51)

## 2015-09-18 MED ORDER — ONDANSETRON 4 MG PO TBDP
ORAL_TABLET | ORAL | Status: AC
  Filled 2015-09-18: qty 1

## 2015-09-18 MED ORDER — ONDANSETRON 4 MG PO TBDP
4.0000 mg | ORAL_TABLET | Freq: Once | ORAL | Status: AC | PRN
  Administered 2015-09-18: 4 mg via ORAL
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
## 2015-09-19 ENCOUNTER — Encounter (HOSPITAL_COMMUNITY): Payer: Self-pay | Admitting: Radiology

## 2015-09-19 ENCOUNTER — Emergency Department (HOSPITAL_COMMUNITY): Payer: MEDICAID

## 2015-09-19 LAB — URINALYSIS, ROUTINE W REFLEX MICROSCOPIC
Bilirubin Urine: NEGATIVE
Glucose, UA: NEGATIVE mg/dL
Ketones, ur: NEGATIVE mg/dL
Leukocytes, UA: NEGATIVE
Nitrite: NEGATIVE
Protein, ur: 100 mg/dL — AB
Specific Gravity, Urine: >1.046 — ABNORMAL HIGH (ref 1.005–1.030)
pH: 6 (ref 5.0–8.0)

## 2015-09-19 LAB — URINE MICROSCOPIC-ADD ON

## 2015-09-19 MED ORDER — ONDANSETRON 8 MG PO TBDP: 8.0000 mg | ORAL_TABLET | Freq: Three times a day (TID) | ORAL | Status: DC | PRN

## 2015-09-19 MED ORDER — HYDROMORPHONE HCL 1 MG/ML IJ SOLN
1.0000 mg | Freq: Once | INTRAMUSCULAR | Status: AC
  Administered 2015-09-19: 1 mg via INTRAVENOUS
  Filled 2015-09-19: qty 1

## 2015-09-19 MED ORDER — PROMETHAZINE HCL 25 MG RE SUPP: 25.0000 mg | Freq: Four times a day (QID) | RECTAL | Status: DC | PRN

## 2015-09-19 MED ORDER — SODIUM CHLORIDE 0.9 % IV BOLUS (SEPSIS)
1000.0000 mL | Freq: Once | INTRAVENOUS | Status: AC
  Administered 2015-09-19: 1000 mL via INTRAVENOUS

## 2015-09-19 MED ORDER — KETOROLAC TROMETHAMINE 15 MG/ML IJ SOLN
15.0000 mg | Freq: Once | INTRAMUSCULAR | Status: AC
  Administered 2015-09-19: 15 mg via INTRAVENOUS
  Filled 2015-09-19: qty 1

## 2015-09-19 MED ORDER — ONDANSETRON 4 MG PO TBDP
8.0000 mg | ORAL_TABLET | Freq: Once | ORAL | Status: AC
  Administered 2015-09-19: 8 mg via ORAL
  Filled 2015-09-19: qty 2

## 2015-09-19 MED ORDER — HYDROCODONE-ACETAMINOPHEN 5-325 MG PO TABS: 1.0000 | ORAL_TABLET | Freq: Four times a day (QID) | ORAL | Status: DC | PRN

## 2015-09-19 MED ORDER — OXYCODONE-ACETAMINOPHEN 5-325 MG PO TABS
1.0000 | ORAL_TABLET | Freq: Once | ORAL | Status: AC
  Administered 2015-09-19: 1 via ORAL
  Filled 2015-09-19: qty 1

## 2015-09-19 MED ORDER — IOHEXOL 300 MG/ML  SOLN
100.0000 mL | Freq: Once | INTRAMUSCULAR | Status: AC | PRN
  Administered 2015-09-19: 100 mL via INTRAVENOUS

## 2015-09-19 MED ORDER — ONDANSETRON HCL 4 MG/2ML IJ SOLN
4.0000 mg | Freq: Once | INTRAMUSCULAR | Status: AC
  Administered 2015-09-19: 4 mg via INTRAVENOUS
  Filled 2015-09-19: qty 2

## 2015-09-19 NOTE — ED Notes (Signed)
Patient transported to CT scan . 

## 2015-09-19 NOTE — Discharge Instructions (Signed)
We saw you in the ER for the abdominal pain. °All of our results are normal, including all labs and imaging. Kidney function is fine as well. °We are not sure what is causing your abdominal pain, and recommend that you see your primary care doctor within 2-3 days for further evaluation. °If your symptoms get worse, return to the ER. °Take the pain meds and nausea meds as prescribed. ° °Please return to the ER if your symptoms worsen; you have increased pain, fevers, chills, inability to keep any medications down, confusion. Otherwise see the outpatient doctor as requested. ° ° °Abdominal Pain, Adult °Many things can cause abdominal pain. Usually, abdominal pain is not caused by a disease and will improve without treatment. It can often be observed and treated at home. Your health care provider will do a physical exam and possibly order blood tests and X-rays to help determine the seriousness of your pain. However, in many cases, more time must pass before a clear cause of the pain can be found. Before that point, your health care provider may not know if you need more testing or further treatment. °HOME CARE INSTRUCTIONS °Monitor your abdominal pain for any changes. The following actions may help to alleviate any discomfort you are experiencing: °· Only take over-the-counter or prescription medicines as directed by your health care provider. °· Do not take laxatives unless directed to do so by your health care provider. °· Try a clear liquid diet (broth, tea, or water) as directed by your health care provider. Slowly move to a bland diet as tolerated. °SEEK MEDICAL CARE IF: °· You have unexplained abdominal pain. °· You have abdominal pain associated with nausea or diarrhea. °· You have pain when you urinate or have a bowel movement. °· You experience abdominal pain that wakes you in the night. °· You have abdominal pain that is worsened or improved by eating food. °· You have abdominal pain that is worsened with  eating fatty foods. °· You have a fever. °SEEK IMMEDIATE MEDICAL CARE IF: °· Your pain does not go away within 2 hours. °· You keep throwing up (vomiting). °· Your pain is felt only in portions of the abdomen, such as the right side or the left lower portion of the abdomen. °· You pass bloody or black tarry stools. °MAKE SURE YOU: °· Understand these instructions. °· Will watch your condition. °· Will get help right away if you are not doing well or get worse. °  °This information is not intended to replace advice given to you by your health care provider. Make sure you discuss any questions you have with your health care provider. °  °Document Released: 04/17/2005 Document Revised: 03/29/2015 Document Reviewed: 03/17/2013 °Elsevier Interactive Patient Education ©2016 [employer] °Clear Liquid Diet °A clear liquid diet is a short-term diet that is prescribed to provide the necessary fluid and basic energy you need when you can have nothing else. The clear liquid diet consists of liquids or solids that will become liquid at room temperature. You should be able to see through the liquid. There are many reasons that you may be restricted to clear liquids, such as: °· When you have a sudden-onset (acute) condition that occurs before or after surgery. °· To help your body slowly get adjusted to food again after a long period when you were unable to have food. °· Replacement of fluids when you have a diarrheal disease. °· When you are going to have certain exams, such as a   colonoscopy, in which instruments are inserted inside your body to look at parts of your digestive system. °WHAT CAN I HAVE? °A clear liquid diet does not provide all the nutrients you need. It is important to choose a variety of the following items to get as many nutrients as possible: °· Vegetable juices that do not have pulp. °· Fruit juices and fruit drinks that do not have pulp. °· Coffee (regular or decaffeinated), tea, or soda at the discretion  of your health care provider. °· Clear bouillon, broth, or strained broth-based soups. °· High-protein and flavored gelatins. °· Sugar or honey. °· Ices or frozen ice pops that do not contain milk. °If you are not sure whether you can have certain items, you should ask your health care provider. You may also ask your health care provider if there are any other clear liquid options. °  °This information is not intended to replace advice given to you by your health care provider. Make sure you discuss any questions you have with your health care provider. °  °Document Released: 07/08/2005 Document Revised: 07/13/2013 Document Reviewed: 06/04/2013 °Elsevier Interactive Patient Education ©2016 [employer] ° °

## 2015-09-19 NOTE — ED Notes (Signed)
Patient transported to CT 
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
## 2015-09-20 LAB — URINE CULTURE: Culture: 2000

## 2015-10-14 ENCOUNTER — Encounter (HOSPITAL_COMMUNITY): Payer: Self-pay | Admitting: Emergency Medicine

## 2015-10-14 ENCOUNTER — Emergency Department (HOSPITAL_COMMUNITY)
Admission: EM | Admit: 2015-10-14 | Discharge: 2015-10-15 | Disposition: A | Discharge status: Home / Self Care | Payer: Self-pay | Attending: Emergency Medicine | Admitting: Emergency Medicine

## 2015-10-14 DIAGNOSIS — Z7901 Long term (current) use of anticoagulants: Secondary | ICD-10-CM | POA: Insufficient documentation

## 2015-10-14 DIAGNOSIS — Z87891 Personal history of nicotine dependence: Secondary | ICD-10-CM | POA: Insufficient documentation

## 2015-10-14 DIAGNOSIS — R112 Nausea with vomiting, unspecified: Secondary | ICD-10-CM | POA: Insufficient documentation

## 2015-10-14 DIAGNOSIS — R079 Chest pain, unspecified: Secondary | ICD-10-CM | POA: Insufficient documentation

## 2015-10-14 DIAGNOSIS — G8929 Other chronic pain: Secondary | ICD-10-CM | POA: Insufficient documentation

## 2015-10-14 DIAGNOSIS — M79604 Pain in right leg: Secondary | ICD-10-CM | POA: Insufficient documentation

## 2015-10-14 DIAGNOSIS — M79601 Pain in right arm: Secondary | ICD-10-CM

## 2015-10-14 DIAGNOSIS — R109 Unspecified abdominal pain: Secondary | ICD-10-CM

## 2015-10-14 DIAGNOSIS — R000 Tachycardia, unspecified: Secondary | ICD-10-CM | POA: Insufficient documentation

## 2015-10-14 DIAGNOSIS — Z933 Colostomy status: Secondary | ICD-10-CM | POA: Insufficient documentation

## 2015-10-14 DIAGNOSIS — Z79899 Other long term (current) drug therapy: Secondary | ICD-10-CM | POA: Insufficient documentation

## 2015-10-14 LAB — URINALYSIS, ROUTINE W REFLEX MICROSCOPIC
Glucose, UA: NEGATIVE mg/dL
Ketones, ur: 15 mg/dL — AB
Nitrite: NEGATIVE
Protein, ur: 100 mg/dL — AB
Specific Gravity, Urine: 1.034 — ABNORMAL HIGH (ref 1.005–1.030)
pH: 6 (ref 5.0–8.0)

## 2015-10-14 LAB — I-STAT TROPONIN, ED: Troponin i, poc: 0 ng/mL (ref 0.00–0.08)

## 2015-10-14 LAB — COMPREHENSIVE METABOLIC PANEL
ALT: 64 U/L — ABNORMAL HIGH (ref 17–63)
AST: 49 U/L — ABNORMAL HIGH (ref 15–41)
Albumin: 4.1 g/dL (ref 3.5–5.0)
Alkaline Phosphatase: 145 U/L — ABNORMAL HIGH (ref 38–126)
Anion gap: 13 (ref 5–15)
BUN: 21 mg/dL — ABNORMAL HIGH (ref 6–20)
CO2: 25 mmol/L (ref 22–32)
Calcium: 11.2 mg/dL — ABNORMAL HIGH (ref 8.9–10.3)
Chloride: 101 mmol/L (ref 101–111)
Creatinine, Ser: 1.37 mg/dL — ABNORMAL HIGH (ref 0.61–1.24)
GFR calc Af Amer: >60 mL/min (ref >60)
GFR calc non Af Amer: >60 mL/min (ref >60)
Glucose, Bld: 111 mg/dL — ABNORMAL HIGH (ref 65–99)
Potassium: 4.7 mmol/L (ref 3.5–5.1)
Sodium: 139 mmol/L (ref 135–145)
Total Bilirubin: 0.9 mg/dL (ref 0.3–1.2)
Total Protein: 8.9 g/dL — ABNORMAL HIGH (ref 6.5–8.1)

## 2015-10-14 LAB — CBC
HCT: 40.5 % (ref 39.0–52.0)
Hemoglobin: 13.9 g/dL (ref 13.0–17.0)
MCH: 29.1 pg (ref 26.0–34.0)
MCHC: 34.3 g/dL (ref 30.0–36.0)
MCV: 84.7 fL (ref 78.0–100.0)
Platelets: 362 10*3/uL (ref 150–400)
RBC: 4.78 MIL/uL (ref 4.22–5.81)
RDW: 14.6 % (ref 11.5–15.5)
WBC: 16.3 10*3/uL — ABNORMAL HIGH (ref 4.0–10.5)

## 2015-10-14 LAB — URINE MICROSCOPIC-ADD ON

## 2015-10-14 LAB — LIPASE, BLOOD: Lipase: 24 U/L (ref 11–51)

## 2015-10-14 MED ORDER — ONDANSETRON HCL 4 MG/2ML IJ SOLN
4.0000 mg | Freq: Once | INTRAMUSCULAR | Status: AC | PRN
  Administered 2015-10-14: 4 mg via INTRAVENOUS
  Filled 2015-10-14: qty 2

## 2015-10-14 MED ORDER — SODIUM CHLORIDE 0.9 % IV BOLUS (SEPSIS)
1000.0000 mL | Freq: Once | INTRAVENOUS | Status: AC
  Administered 2015-10-14: 1000 mL via INTRAVENOUS

## 2015-10-14 MED ORDER — PROMETHAZINE HCL 25 MG/ML IJ SOLN
25.0000 mg | Freq: Once | INTRAMUSCULAR | Status: AC
  Administered 2015-10-14: 25 mg via INTRAVENOUS
  Filled 2015-10-14: qty 1

## 2015-10-14 MED ORDER — MORPHINE SULFATE (PF) 4 MG/ML IV SOLN
4.0000 mg | Freq: Once | INTRAVENOUS | Status: AC
  Administered 2015-10-14: 4 mg via INTRAVENOUS
  Filled 2015-10-14: qty 1
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
## 2015-10-14 NOTE — ED Notes (Signed)
Patient still vomiting after 4mg  zofran. Requesting some pain medication for his leg. MD made aware.
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
## 2015-10-15 ENCOUNTER — Ambulatory Visit (HOSPITAL_COMMUNITY): Payer: Self-pay | Attending: Emergency Medicine

## 2015-10-15 ENCOUNTER — Encounter (HOSPITAL_COMMUNITY): Payer: Self-pay | Admitting: Emergency Medicine

## 2015-10-15 ENCOUNTER — Encounter (HOSPITAL_COMMUNITY): Payer: Self-pay | Admitting: Radiology

## 2015-10-15 ENCOUNTER — Emergency Department (HOSPITAL_COMMUNITY): Payer: Self-pay

## 2015-10-15 ENCOUNTER — Emergency Department (HOSPITAL_COMMUNITY)
Admission: EM | Admit: 2015-10-15 | Discharge: 2015-10-16 | Disposition: A | Discharge status: Home / Self Care | Payer: MEDICAID | Attending: Emergency Medicine | Admitting: Emergency Medicine

## 2015-10-15 ENCOUNTER — Emergency Department (HOSPITAL_COMMUNITY): Payer: MEDICAID

## 2015-10-15 DIAGNOSIS — R112 Nausea with vomiting, unspecified: Secondary | ICD-10-CM | POA: Insufficient documentation

## 2015-10-15 DIAGNOSIS — Z9049 Acquired absence of other specified parts of digestive tract: Secondary | ICD-10-CM | POA: Insufficient documentation

## 2015-10-15 DIAGNOSIS — Z87828 Personal history of other (healed) physical injury and trauma: Secondary | ICD-10-CM | POA: Insufficient documentation

## 2015-10-15 DIAGNOSIS — R1084 Generalized abdominal pain: Secondary | ICD-10-CM | POA: Insufficient documentation

## 2015-10-15 DIAGNOSIS — G8929 Other chronic pain: Secondary | ICD-10-CM | POA: Insufficient documentation

## 2015-10-15 DIAGNOSIS — Z933 Colostomy status: Secondary | ICD-10-CM | POA: Insufficient documentation

## 2015-10-15 DIAGNOSIS — Z87891 Personal history of nicotine dependence: Secondary | ICD-10-CM | POA: Insufficient documentation

## 2015-10-15 DIAGNOSIS — Z79899 Other long term (current) drug therapy: Secondary | ICD-10-CM | POA: Insufficient documentation

## 2015-10-15 DIAGNOSIS — M79604 Pain in right leg: Secondary | ICD-10-CM | POA: Insufficient documentation

## 2015-10-15 LAB — URINE MICROSCOPIC-ADD ON

## 2015-10-15 LAB — URINALYSIS, ROUTINE W REFLEX MICROSCOPIC
Glucose, UA: NEGATIVE mg/dL
Hgb urine dipstick: NEGATIVE
Ketones, ur: 15 mg/dL — AB
Leukocytes, UA: NEGATIVE
Nitrite: NEGATIVE
Protein, ur: 100 mg/dL — AB
Specific Gravity, Urine: 1.043 — ABNORMAL HIGH (ref 1.005–1.030)
pH: 6 (ref 5.0–8.0)

## 2015-10-15 LAB — COMPREHENSIVE METABOLIC PANEL
ALT: 131 U/L — ABNORMAL HIGH (ref 17–63)
AST: 103 U/L — ABNORMAL HIGH (ref 15–41)
Albumin: 3.8 g/dL (ref 3.5–5.0)
Alkaline Phosphatase: 131 U/L — ABNORMAL HIGH (ref 38–126)
Anion gap: 13 (ref 5–15)
BUN: 17 mg/dL (ref 6–20)
CO2: 21 mmol/L — ABNORMAL LOW (ref 22–32)
Calcium: 10.1 mg/dL (ref 8.9–10.3)
Chloride: 102 mmol/L (ref 101–111)
Creatinine, Ser: 1.17 mg/dL (ref 0.61–1.24)
GFR calc Af Amer: >60 mL/min (ref >60)
GFR calc non Af Amer: >60 mL/min (ref >60)
Glucose, Bld: 111 mg/dL — ABNORMAL HIGH (ref 65–99)
Potassium: 3.5 mmol/L (ref 3.5–5.1)
Sodium: 136 mmol/L (ref 135–145)
Total Bilirubin: 0.6 mg/dL (ref 0.3–1.2)
Total Protein: 7.9 g/dL (ref 6.5–8.1)

## 2015-10-15 LAB — CBC
HCT: 37 % — ABNORMAL LOW (ref 39.0–52.0)
Hemoglobin: 12.2 g/dL — ABNORMAL LOW (ref 13.0–17.0)
MCH: 27.9 pg (ref 26.0–34.0)
MCHC: 33 g/dL (ref 30.0–36.0)
MCV: 84.7 fL (ref 78.0–100.0)
Platelets: 348 10*3/uL (ref 150–400)
RBC: 4.37 MIL/uL (ref 4.22–5.81)
RDW: 14.5 % (ref 11.5–15.5)
WBC: 13.8 10*3/uL — ABNORMAL HIGH (ref 4.0–10.5)

## 2015-10-15 LAB — RAPID URINE DRUG SCREEN, HOSP PERFORMED
Amphetamines: NOT DETECTED
Barbiturates: NOT DETECTED
Benzodiazepines: NOT DETECTED
Cocaine: NOT DETECTED
Opiates: POSITIVE — AB
Tetrahydrocannabinol: NOT DETECTED

## 2015-10-15 LAB — LIPASE, BLOOD: Lipase: 22 U/L (ref 11–51)

## 2015-10-15 MED ORDER — IOHEXOL 350 MG/ML SOLN
100.0000 mL | Freq: Once | INTRAVENOUS | Status: AC | PRN
  Administered 2015-10-15: 90 mL via INTRAVENOUS

## 2015-10-15 MED ORDER — ONDANSETRON 4 MG PO TBDP
ORAL_TABLET | ORAL | Status: AC
  Filled 2015-10-15: qty 1

## 2015-10-15 MED ORDER — ONDANSETRON 4 MG PO TBDP
4.0000 mg | ORAL_TABLET | Freq: Once | ORAL | Status: AC | PRN
  Administered 2015-10-15: 4 mg via ORAL

## 2015-10-15 MED ORDER — ONDANSETRON 4 MG PO TBDP
4.0000 mg | ORAL_TABLET | Freq: Once | ORAL | Status: AC
  Administered 2015-10-15: 4 mg via ORAL
  Filled 2015-10-15: qty 1

## 2015-10-15 MED ORDER — HALOPERIDOL LACTATE 5 MG/ML IJ SOLN
2.0000 mg | Freq: Once | INTRAMUSCULAR | Status: AC
  Administered 2015-10-15: 2 mg via INTRAMUSCULAR
  Filled 2015-10-15: qty 1

## 2015-10-15 MED ORDER — OXYCODONE-ACETAMINOPHEN 5-325 MG PO TABS: 2.0000 | ORAL_TABLET | ORAL | Status: DC | PRN

## 2015-10-15 MED ORDER — OXYCODONE-ACETAMINOPHEN 5-325 MG PO TABS
2.0000 | ORAL_TABLET | Freq: Once | ORAL | Status: AC
  Administered 2015-10-15: 2 via ORAL
  Filled 2015-10-15: qty 2
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
## 2015-10-15 NOTE — ED Notes (Signed)
This RN informed pt that MD ordered 2 percocet. Pt states it usually does not work for him and usually gets dilaudid. MD made aware. Pt agrees to take ordered medication.

## 2015-10-15 NOTE — Discharge Instructions (Signed)
Gastritis, Adult Gastritis is soreness and swelling (inflammation) of the lining of the stomach. Gastritis can develop as a sudden onset (acute) or long-term (chronic) condition. If gastritis is not treated, it can lead to stomach bleeding and ulcers. CAUSES  Gastritis occurs when the stomach lining is weak or damaged. Digestive juices from the stomach then inflame the weakened stomach lining. The stomach lining may be weak or damaged due to viral or bacterial infections. One common bacterial infection is the Helicobacter pylori infection. Gastritis can also result from excessive alcohol consumption, taking certain medicines, or having too much acid in the stomach.  SYMPTOMS  In some cases, there are no symptoms. When symptoms are present, they may include:  Pain or a burning sensation in the upper abdomen.  Nausea.  Vomiting.  An uncomfortable feeling of fullness after eating. DIAGNOSIS  Your caregiver may suspect you have gastritis based on your symptoms and a physical exam. To determine the cause of your gastritis, your caregiver may perform the following:  Blood or stool tests to check for the H pylori bacterium.  Gastroscopy. A thin, flexible tube (endoscope) is passed down the esophagus and into the stomach. The endoscope has a light and camera on the end. Your caregiver uses the endoscope to view the inside of the stomach.  Taking a tissue sample (biopsy) from the stomach to examine under a microscope. TREATMENT  Depending on the cause of your gastritis, medicines may be prescribed. If you have a bacterial infection, such as an H pylori infection, antibiotics may be given. If your gastritis is caused by too much acid in the stomach, H2 blockers or antacids may be given. Your caregiver may recommend that you stop taking aspirin, ibuprofen, or other nonsteroidal anti-inflammatory drugs (NSAIDs). HOME CARE INSTRUCTIONS  Only take over-the-counter or prescription medicines as directed by  your caregiver.  If you were given antibiotic medicines, take them as directed. Finish them even if you start to feel better.  Drink enough fluids to keep your urine clear or pale yellow.  Avoid foods and drinks that make your symptoms worse, such as:  Caffeine or alcoholic drinks.  Chocolate.  Peppermint or mint flavorings.  Garlic and onions.  Spicy foods.  Citrus fruits, such as oranges, lemons, or limes.  Tomato-based foods such as sauce, chili, salsa, and pizza.  Fried and fatty foods.  Eat small, frequent meals instead of large meals. SEEK IMMEDIATE MEDICAL CARE IF:   You have black or dark red stools.  You vomit blood or material that looks like coffee grounds.  You are unable to keep fluids down.  Your abdominal pain gets worse.  You have a fever.  You do not feel better after 1 week.  You have any other questions or concerns. MAKE SURE YOU:  Understand these instructions.  Will watch your condition.  Will get help right away if you are not doing well or get worse.   This information is not intended to replace advice given to you by your health care provider. Make sure you discuss any questions you have with your health care provider.   Document Released: 07/02/2001 Document Revised: 01/07/2012 Document Reviewed: 08/21/2011 Elsevier Interactive Patient Education 2016 [employer] Deep Vein Thrombosis A deep vein thrombosis (DVT) is a blood clot (thrombus) that usually occurs in a deep, larger vein of the lower leg or the pelvis, or in an upper extremity such as the arm. These are dangerous and can lead to serious and even life-threatening complications if  the clot travels to the lungs. A DVT can damage the valves in your leg veins so that instead of flowing upward, the blood pools in the lower leg. This is called post-thrombotic syndrome, and it can result in pain, swelling, discoloration, and sores on the leg. CAUSES A DVT is caused by the formation  of a blood clot in your leg, pelvis, or arm. Usually, several things contribute to the formation of blood clots. A clot may develop when:  Your blood flow slows down.  Your vein becomes damaged in some way.  You have a condition that makes your blood clot more easily. RISK FACTORS A DVT is more likely to develop in:  People who are older, especially over 27 years of age.  People who are overweight (obese).  People who sit or lie still for a long time, such as during long-distance travel (over 4 hours), bed rest, hospitalization, or during recovery from certain medical conditions like a stroke.  People who do not engage in much physical activity (sedentary lifestyle).  People who have chronic breathing disorders.  People who have a personal or family history of blood clots or blood clotting disease.  People who have peripheral vascular disease (PVD), diabetes, or some types of cancer.  People who have heart disease, especially if the person had a recent heart attack or has congestive heart failure.  People who have neurological diseases that affect the legs (leg paresis).  People who have had a traumatic injury, such as breaking a hip or leg.  People who have recently had major or lengthy surgery, especially on the hip, knee, or abdomen.  People who have had a central line placed inside a large vein.  People who take medicines that contain the hormone estrogen. These include birth control pills and hormone replacement therapy.  Pregnancy or during childbirth or the postpartum period.  Long plane flights (over 8 hours). SIGNS AND SYMPTOMS Symptoms of a DVT can include:   Swelling of your leg or arm, especially if one side is much worse.  Warmth and redness of your leg or arm, especially if one side is much worse.  Pain in your arm or leg. If the clot is in your leg, symptoms may be more noticeable or worse when you stand or walk.  A feeling of pins and needles, if the  clot is in the arm. The symptoms of a DVT that has traveled to the lungs (pulmonary embolism, PE) usually start suddenly and include:  Shortness of breath while active or at rest.  Coughing or coughing up blood or blood-tinged mucus.  Chest pain that is often worse with deep breaths.  Rapid or irregular heartbeat.  Feeling light-headed or dizzy.  Fainting.  Feeling anxious.  Sweating. There may also be pain and swelling in a leg if that is where the blood clot started. These symptoms may represent a serious problem that is an emergency. Do not wait to see if the symptoms will go away. Get medical help right away. Call your local emergency services (911 in the [place] Do not drive yourself to the hospital. DIAGNOSIS Your health care provider will take a medical history and perform a physical exam. You may also have other tests, including:  Blood tests to assess the clotting properties of your blood.  Imaging tests, such as CT, ultrasound, MRI, X-ray, and other tests to see if you have clots anywhere in your body. TREATMENT After a DVT is identified, it can be treated. The type  of treatment that you receive depends on many factors, such as the cause of your DVT, your risk for bleeding or developing more clots, and other medical conditions that you have. Sometimes, a combination of treatments is necessary. Treatment options may be combined and include:  Monitoring the blood clot with ultrasound.  Taking medicines by mouth, such as newer blood thinners (anticoagulants), thrombolytics, or warfarin.  Taking anticoagulant medicine by injection or through an IV tube.  Wearing compression stockings or using different types ofdevices.  Surgery (rare) to remove the blood clot or to place a filter in your abdomen to stop the blood clot from traveling to your lungs. Treatments for a DVT are often divided into immediate treatment and long-term treatment (up to 3 months after DVT). You can  work with your health care provider to choose the treatment program that is best for you. HOME CARE INSTRUCTIONS If you are taking a newer oral anticoagulant:  Take the medicine every single day at the same time each day.  Understand what foods and drugs interact with this medicine.  Understand that there are no regular blood tests required when using this medicine.  Understand the side effects of this medicine, including excessive bruising or bleeding. Ask your health care provider or pharmacist about other possible side effects. If you are taking warfarin:  Understand how to take warfarin and know which foods can affect how warfarin works in [employer].  Understand that it is dangerous to take too much or too little warfarin. Too much warfarin increases the risk of bleeding. Too little warfarin continues to allow the risk for blood clots.  Follow your PT and INR blood testing schedule. The PT and INR results allow your health care provider to adjust your dose of warfarin. It is very important that you have your PT and INR tested as often as told by your health care provider.  Avoid major changes in your diet, or tell your health care provider before you change your diet. Arrange a visit with a registered dietitian to answer your questions. Many foods, especially foods that are high in vitamin K, can interfere with warfarin and affect the PT and INR results. Eat a consistent amount of foods that are high in vitamin K, such as:  Spinach, kale, broccoli, cabbage, collard greens, turnip greens, Brussels sprouts, peas, cauliflower, seaweed, and parsley.  Beef liver and pork liver.  Green tea.  Soybean oil.  Tell your health care provider about any and all medicines, vitamins, and supplements that you take, including aspirin and other over-the-counter anti-inflammatory medicines. Be especially cautious with aspirin and anti-inflammatory medicines. Do not take those before you ask your health  care provider if it is safe to do so. This is important because many medicines can interfere with warfarin and affect the PT and INR results.  Do not start or stop taking any over-the-counter or prescription medicine unless your health care provider or pharmacist tells you to do so. If you take warfarin, you will also need to do these things:  Hold pressure over cuts for longer than usual.  Tell your dentist and other health care providers that you are taking warfarin before you have any procedures in which bleeding may occur.  Avoid alcohol or drink very small amounts. Tell your health care provider if you change your alcohol intake.  Do not use tobacco products, including cigarettes, chewing tobacco, and e-cigarettes. If you need help quitting, ask your health care provider.  Avoid contact sports. General  Instructions  Take over-the-counter and prescription medicines only as told by your health care provider. Anticoagulant medicines can have side effects, including easy bruising and difficulty stopping bleeding. If you are prescribed an anticoagulant, you will also need to do these things:  Hold pressure over cuts for longer than usual.  Tell your dentist and other health care providers that you are taking anticoagulants before you have any procedures in which bleeding may occur.  Avoid contact sports.  Wear a medical alert bracelet or carry a medical alert card that says you have had a PE.  Ask your health care provider how soon you can go back to your normal activities. Stay active to prevent new blood clots from forming.  Make sure to exercise while traveling or when you have been sitting or standing for a long period of time. It is very important to exercise. Exercise your legs by walking or by tightening and relaxing your leg muscles often. Take frequent walks.  Wear compression stockings as told by your health care provider to help prevent more blood clots from forming.  Do  not use tobacco products, including cigarettes, chewing tobacco, and e-cigarettes. If you need help quitting, ask your health care provider.  Keep all follow-up appointments with your health care provider. This is important. PREVENTION Take these actions to decrease your risk of developing another DVT:  Exercise regularly. For at least 30 minutes every day, engage in:  Activity that involves moving your arms and legs.  Activity that encourages good blood flow through your body by increasing your heart rate.  Exercise your arms and legs every hour during long-distance travel (over 4 hours). Drink plenty of water and avoid drinking alcohol while traveling.  Avoid sitting or lying in bed for long periods of time without moving your legs.  Maintain a weight that is appropriate for your height. Ask your health care provider what weight is healthy for you.  If you are a woman who is over 38 years of age, avoid unnecessary use of medicines that contain estrogen. These include birth control pills.  Do not smoke, especially if you take estrogen medicines. If you need help quitting, ask your health care provider. If you are hospitalized, prevention measures may include:  Early walking after surgery, as soon as your health care provider says that it is safe.  Receiving anticoagulants to prevent blood clots.If you cannot take anticoagulants, other options may be available, such as wearing compression stockings or using different types of devices. SEEK IMMEDIATE MEDICAL CARE IF:  You have new or increased pain, swelling, or redness in an arm or leg.  You have numbness or tingling in an arm or leg.  You have shortness of breath while active or at rest.  You have chest pain.  You have a rapid or irregular heartbeat.  You feel light-headed or dizzy.  You cough up blood.  You notice blood in your vomit, bowel movement, or urine. These symptoms may represent a serious problem that is an  emergency. Do not wait to see if the symptoms will go away. Get medical help right away. Call your local emergency services (911 in the [place] Do not drive yourself to the hospital.   This information is not intended to replace advice given to you by your health care provider. Make sure you discuss any questions you have with your health care provider.   Document Released: 07/08/2005 Document Revised: 03/29/2015 Document Reviewed: 11/02/2014 Elsevier Interactive Patient Education [employer]2016 Else[employer] Inc[employer]

## 2015-10-15 NOTE — ED Notes (Signed)
Provided pt with sprite per fluid challenge. Pt tolerating well. Denies nausea at this time.
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
## 2015-10-16 MED ORDER — ONDANSETRON 4 MG PO TBDP: 4.0000 mg | ORAL_TABLET | Freq: Three times a day (TID) | ORAL | Status: DC | PRN

## 2015-10-16 NOTE — Discharge Instructions (Signed)
You have been given a prescription for Zofran.  Please put one tablet under your tongue, wait 20-30 minutes before trying to eat or drink anything.  If you still feel nauseated after the 1 tablet you can repeat this 1 .  Nausea and Vomiting Nausea is a sick feeling that often comes before throwing up (vomiting). Vomiting is a reflex where stomach contents come out of your mouth. Vomiting can cause severe loss of body fluids (dehydration). Children and elderly adults can become dehydrated quickly, especially if they also have diarrhea. Nausea and vomiting are symptoms of a condition or disease. It is important to find the cause of your symptoms. CAUSES   Direct irritation of the stomach lining. This irritation can result from increased acid production (gastroesophageal reflux disease), infection, food poisoning, taking certain medicines (such as nonsteroidal anti-inflammatory drugs), alcohol use, or tobacco use.  Signals from the brain.These signals could be caused by a headache, heat exposure, an inner ear disturbance, increased pressure in the brain from injury, infection, a tumor, or a concussion, pain, emotional stimulus, or metabolic problems.  An obstruction in the gastrointestinal tract (bowel obstruction).  Illnesses such as diabetes, hepatitis, gallbladder problems, appendicitis, kidney problems, cancer, sepsis, atypical symptoms of a heart attack, or eating disorders.  Medical treatments such as chemotherapy and radiation.  Receiving medicine that makes you sleep (general anesthetic) during surgery. DIAGNOSIS Your caregiver may ask for tests to be done if the problems do not improve after a few days. Tests may also be done if symptoms are severe or if the reason for the nausea and vomiting is not clear. Tests may include:  Urine tests.  Blood tests.  Stool tests.  Cultures (to look for evidence of infection).  X-rays or other imaging studies. Test results can help your  caregiver make decisions about treatment or the need for additional tests. TREATMENT You need to stay well hydrated. Drink frequently but in small amounts.You may wish to drink water, sports drinks, clear broth, or eat frozen ice pops or gelatin dessert to help stay hydrated.When you eat, eating slowly may help prevent nausea.There are also some antinausea medicines that may help prevent nausea. HOME CARE INSTRUCTIONS   Take all medicine as directed by your caregiver.  If you do not have an appetite, do not force yourself to eat. However, you must continue to drink fluids.  If you have an appetite, eat a normal diet unless your caregiver tells you differently.  Eat a variety of complex carbohydrates (rice, wheat, potatoes, bread), lean meats, yogurt, fruits, and vegetables.  Avoid high-fat foods because they are more difficult to digest.  Drink enough water and fluids to keep your urine clear or pale yellow.  If you are dehydrated, ask your caregiver for specific rehydration instructions. Signs of dehydration may include:  Severe thirst.  Dry lips and mouth.  Dizziness.  Dark urine.  Decreasing urine frequency and amount.  Confusion.  Rapid breathing or pulse. SEEK IMMEDIATE MEDICAL CARE IF:   You have blood or brown flecks (like coffee grounds) in your vomit.  You have black or bloody stools.  You have a severe headache or stiff neck.  You are confused.  You have severe abdominal pain.  You have chest pain or trouble breathing.  You do not urinate at least once every 8 hours.  You develop cold or clammy skin.  You continue to vomit for longer than 24 to 48 hours.  You have a fever. MAKE SURE YOU:  Understand these instructions.  Will watch your condition.  Will get help right away if you are not doing well or get worse.   This information is not intended to replace advice given to you by your health care provider. Make sure you discuss any questions  you have with your health care provider.   Document Released: 07/08/2005 Document Revised: 09/30/2011 Document Reviewed: 12/05/2010 Elsevier Interactive Patient Education [employer].

## 2015-10-16 NOTE — ED Notes (Signed)
Gave pt ginger ale.  

## 2015-10-17 ENCOUNTER — Emergency Department (HOSPITAL_COMMUNITY): Payer: MEDICAID

## 2015-10-17 ENCOUNTER — Encounter (HOSPITAL_COMMUNITY): Payer: Self-pay

## 2015-10-17 ENCOUNTER — Emergency Department (HOSPITAL_COMMUNITY)
Admission: EM | Admit: 2015-10-17 | Discharge: 2015-10-17 | Disposition: A | Discharge status: Home / Self Care | Payer: MEDICAID | Attending: Emergency Medicine | Admitting: Emergency Medicine

## 2015-10-17 DIAGNOSIS — Z79899 Other long term (current) drug therapy: Secondary | ICD-10-CM | POA: Insufficient documentation

## 2015-10-17 DIAGNOSIS — Z7901 Long term (current) use of anticoagulants: Secondary | ICD-10-CM | POA: Insufficient documentation

## 2015-10-17 DIAGNOSIS — R109 Unspecified abdominal pain: Secondary | ICD-10-CM

## 2015-10-17 DIAGNOSIS — R112 Nausea with vomiting, unspecified: Secondary | ICD-10-CM | POA: Insufficient documentation

## 2015-10-17 DIAGNOSIS — M791 Myalgia: Secondary | ICD-10-CM | POA: Insufficient documentation

## 2015-10-17 DIAGNOSIS — R1084 Generalized abdominal pain: Secondary | ICD-10-CM | POA: Insufficient documentation

## 2015-10-17 DIAGNOSIS — Z87891 Personal history of nicotine dependence: Secondary | ICD-10-CM | POA: Insufficient documentation

## 2015-10-17 DIAGNOSIS — Z87828 Personal history of other (healed) physical injury and trauma: Secondary | ICD-10-CM | POA: Insufficient documentation

## 2015-10-17 LAB — COMPREHENSIVE METABOLIC PANEL
ALT: 74 U/L — ABNORMAL HIGH (ref 17–63)
AST: 25 U/L (ref 15–41)
Albumin: 4.4 g/dL (ref 3.5–5.0)
Alkaline Phosphatase: 125 U/L (ref 38–126)
Anion gap: 12 (ref 5–15)
BUN: 18 mg/dL (ref 6–20)
CO2: 21 mmol/L — ABNORMAL LOW (ref 22–32)
Calcium: 10.3 mg/dL (ref 8.9–10.3)
Chloride: 102 mmol/L (ref 101–111)
Creatinine, Ser: 1.22 mg/dL (ref 0.61–1.24)
GFR calc Af Amer: >60 mL/min (ref >60)
GFR calc non Af Amer: >60 mL/min (ref >60)
Glucose, Bld: 101 mg/dL — ABNORMAL HIGH (ref 65–99)
Potassium: 3.7 mmol/L (ref 3.5–5.1)
Sodium: 135 mmol/L (ref 135–145)
Total Bilirubin: 0.6 mg/dL (ref 0.3–1.2)
Total Protein: 8.6 g/dL — ABNORMAL HIGH (ref 6.5–8.1)

## 2015-10-17 LAB — URINALYSIS, ROUTINE W REFLEX MICROSCOPIC
Glucose, UA: NEGATIVE mg/dL
Ketones, ur: NEGATIVE mg/dL
Nitrite: NEGATIVE
Protein, ur: 100 mg/dL — AB
Specific Gravity, Urine: 1.027 (ref 1.005–1.030)
pH: 6 (ref 5.0–8.0)

## 2015-10-17 LAB — URINE MICROSCOPIC-ADD ON

## 2015-10-17 LAB — CBC
HCT: 38.5 % — ABNORMAL LOW (ref 39.0–52.0)
Hemoglobin: 13.3 g/dL (ref 13.0–17.0)
MCH: 28.7 pg (ref 26.0–34.0)
MCHC: 34.5 g/dL (ref 30.0–36.0)
MCV: 83.2 fL (ref 78.0–100.0)
Platelets: 335 10*3/uL (ref 150–400)
RBC: 4.63 MIL/uL (ref 4.22–5.81)
RDW: 14.3 % (ref 11.5–15.5)
WBC: 19.1 10*3/uL — ABNORMAL HIGH (ref 4.0–10.5)

## 2015-10-17 LAB — LIPASE, BLOOD: Lipase: 19 U/L (ref 11–51)

## 2015-10-17 MED ORDER — IOHEXOL 300 MG/ML  SOLN
25.0000 mL | Freq: Once | INTRAMUSCULAR | Status: AC | PRN
  Administered 2015-10-17: 25 mL via ORAL

## 2015-10-17 MED ORDER — ONDANSETRON HCL 4 MG/2ML IJ SOLN
4.0000 mg | Freq: Once | INTRAMUSCULAR | Status: AC
  Administered 2015-10-17: 4 mg via INTRAVENOUS
  Filled 2015-10-17: qty 2

## 2015-10-17 MED ORDER — IOPAMIDOL (ISOVUE-300) INJECTION 61%
100.0000 mL | Freq: Once | INTRAVENOUS | Status: AC | PRN
  Administered 2015-10-17: 100 mL via INTRAVENOUS

## 2015-10-17 MED ORDER — MORPHINE SULFATE (PF) 4 MG/ML IV SOLN
4.0000 mg | Freq: Once | INTRAVENOUS | Status: AC
  Administered 2015-10-17: 4 mg via INTRAVENOUS
  Filled 2015-10-17: qty 1

## 2015-10-17 MED ORDER — HYDROMORPHONE HCL 1 MG/ML IJ SOLN
1.0000 mg | Freq: Once | INTRAMUSCULAR | Status: AC
Start: 2015-10-17 — End: 2015-10-17
  Administered 2015-10-17: 1 mg via INTRAVENOUS
  Filled 2015-10-17: qty 1

## 2015-10-17 MED ORDER — PROMETHAZINE HCL 25 MG RE SUPP: 25.0000 mg | Freq: Four times a day (QID) | RECTAL | Status: DC | PRN

## 2015-10-17 MED ORDER — HYDROCODONE-ACETAMINOPHEN 5-325 MG PO TABS: 1.0000 | ORAL_TABLET | Freq: Four times a day (QID) | ORAL | Status: DC | PRN

## 2015-10-17 MED ORDER — SODIUM CHLORIDE 0.9 % IV BOLUS (SEPSIS)
1000.0000 mL | Freq: Once | INTRAVENOUS | Status: AC
  Administered 2015-10-17: 1000 mL via INTRAVENOUS

## 2015-10-17 MED ORDER — ONDANSETRON 4 MG PO TBDP: 4.0000 mg | ORAL_TABLET | Freq: Three times a day (TID) | ORAL | Status: DC | PRN

## 2015-10-17 NOTE — ED Notes (Signed)
Pt transported to CT ?
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
## 2015-10-17 NOTE — ED Notes (Signed)
Pt able to tolerate ginger ale without difficulty.
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
## 2015-10-17 NOTE — ED Notes (Signed)
Pt cannot use restroom at this time, aware urine specimen is needed.  

## 2015-10-17 NOTE — ED Notes (Signed)
Per EMS, pt here with abdominal pain and vomiting x 3 days  Pt is from home.  Pt at [facility] on 3/26 and prescribed zofran.  Pt states "unable to keep down".  Vitals: 128/60, hr 114, resp 16, 97% ra.

## 2015-10-17 NOTE — Progress Notes (Signed)
Pt confirms pcp is Dr Lanette HampshireJohn Bowen [phone].---[address], [place], [place] [id number]

## 2015-10-17 NOTE — ED Notes (Signed)
DRINKING ORAL CONTRAST 

## 2015-10-18 ENCOUNTER — Encounter (HOSPITAL_COMMUNITY): Payer: Self-pay

## 2015-10-18 ENCOUNTER — Emergency Department (HOSPITAL_COMMUNITY)
Admission: EM | Admit: 2015-10-18 | Discharge: 2015-10-19 | Disposition: A | Discharge status: Home / Self Care | Payer: Self-pay | Attending: Emergency Medicine | Admitting: Emergency Medicine

## 2015-10-18 ENCOUNTER — Emergency Department (HOSPITAL_COMMUNITY): Payer: Self-pay

## 2015-10-18 DIAGNOSIS — N390 Urinary tract infection, site not specified: Secondary | ICD-10-CM | POA: Insufficient documentation

## 2015-10-18 DIAGNOSIS — R109 Unspecified abdominal pain: Secondary | ICD-10-CM

## 2015-10-18 DIAGNOSIS — R110 Nausea: Secondary | ICD-10-CM

## 2015-10-18 DIAGNOSIS — Z87828 Personal history of other (healed) physical injury and trauma: Secondary | ICD-10-CM | POA: Insufficient documentation

## 2015-10-18 DIAGNOSIS — Z87891 Personal history of nicotine dependence: Secondary | ICD-10-CM | POA: Insufficient documentation

## 2015-10-18 DIAGNOSIS — Z79899 Other long term (current) drug therapy: Secondary | ICD-10-CM | POA: Insufficient documentation

## 2015-10-18 DIAGNOSIS — G8929 Other chronic pain: Secondary | ICD-10-CM | POA: Insufficient documentation

## 2015-10-18 HISTORY — DX: Accidental discharge from unspecified firearms or gun, initial encounter: W34.00XA

## 2015-10-18 LAB — URINALYSIS, ROUTINE W REFLEX MICROSCOPIC
Glucose, UA: NEGATIVE mg/dL
Ketones, ur: NEGATIVE mg/dL
Nitrite: NEGATIVE
Protein, ur: 100 mg/dL — AB
Specific Gravity, Urine: 1.045 — ABNORMAL HIGH (ref 1.005–1.030)
pH: 6 (ref 5.0–8.0)

## 2015-10-18 LAB — CBC
HCT: 39.8 % (ref 39.0–52.0)
Hemoglobin: 13.8 g/dL (ref 13.0–17.0)
MCH: 28.9 pg (ref 26.0–34.0)
MCHC: 34.7 g/dL (ref 30.0–36.0)
MCV: 83.4 fL (ref 78.0–100.0)
Platelets: 352 10*3/uL (ref 150–400)
RBC: 4.77 MIL/uL (ref 4.22–5.81)
RDW: 14.2 % (ref 11.5–15.5)
WBC: 18.3 10*3/uL — ABNORMAL HIGH (ref 4.0–10.5)

## 2015-10-18 LAB — COMPREHENSIVE METABOLIC PANEL
ALT: 55 U/L (ref 17–63)
AST: 21 U/L (ref 15–41)
Albumin: 4.5 g/dL (ref 3.5–5.0)
Alkaline Phosphatase: 117 U/L (ref 38–126)
Anion gap: 12 (ref 5–15)
BUN: 16 mg/dL (ref 6–20)
CO2: 23 mmol/L (ref 22–32)
Calcium: 10.5 mg/dL — ABNORMAL HIGH (ref 8.9–10.3)
Chloride: 100 mmol/L — ABNORMAL LOW (ref 101–111)
Creatinine, Ser: 1.14 mg/dL (ref 0.61–1.24)
GFR calc Af Amer: >60 mL/min (ref >60)
GFR calc non Af Amer: >60 mL/min (ref >60)
Glucose, Bld: 97 mg/dL (ref 65–99)
Potassium: 3.7 mmol/L (ref 3.5–5.1)
Sodium: 135 mmol/L (ref 135–145)
Total Bilirubin: 0.4 mg/dL (ref 0.3–1.2)
Total Protein: 9.1 g/dL — ABNORMAL HIGH (ref 6.5–8.1)

## 2015-10-18 LAB — URINE MICROSCOPIC-ADD ON

## 2015-10-18 LAB — LIPASE, BLOOD: Lipase: 21 U/L (ref 11–51)

## 2015-10-18 MED ORDER — SODIUM CHLORIDE 0.9 % IV BOLUS (SEPSIS)
1000.0000 mL | Freq: Once | INTRAVENOUS | Status: AC
  Administered 2015-10-18: 1000 mL via INTRAVENOUS

## 2015-10-18 MED ORDER — METOCLOPRAMIDE HCL 5 MG/ML IJ SOLN
10.0000 mg | Freq: Once | INTRAMUSCULAR | Status: AC
  Administered 2015-10-18: 10 mg via INTRAVENOUS
  Filled 2015-10-18: qty 2

## 2015-10-18 MED ORDER — AZITHROMYCIN 250 MG PO TABS
1000.0000 mg | ORAL_TABLET | Freq: Once | ORAL | Status: AC
  Administered 2015-10-18: 1000 mg via ORAL
  Filled 2015-10-18: qty 4

## 2015-10-18 MED ORDER — KETOROLAC TROMETHAMINE 30 MG/ML IJ SOLN
30.0000 mg | Freq: Once | INTRAMUSCULAR | Status: AC
  Administered 2015-10-18: 30 mg via INTRAVENOUS
  Filled 2015-10-18: qty 1

## 2015-10-18 MED ORDER — CEFTRIAXONE SODIUM 1 G IJ SOLR
1.0000 g | Freq: Once | INTRAMUSCULAR | Status: AC
  Administered 2015-10-18: 1 g via INTRAVENOUS
  Filled 2015-10-18: qty 10

## 2015-10-18 NOTE — ED Notes (Signed)
Patient reports that he was seen in the ED yesterday for abdominal pain and vomiting. Patient states his symptoms are worse and is unable to keep Zofran down.
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
## 2015-10-19 NOTE — Discharge Instructions (Signed)
Please use your phenergan suppository. (prescribed to you last night). This medication is placed in your rectum (anus) so that you cannot vomit it up. If you did not fill this medication, do so immediately tonight at a 24 hour pharmacy ([facility] [address]). Once your nausea is improved you should be able to hold down your other medications. Please follow up with your surgeons if you continue to have pain.    Chronic Pain Chronic pain can be defined as pain that is off and on and lasts for 3-6 months or longer. Many things cause chronic pain, which can make it difficult to make a diagnosis. There are many treatment options available for chronic pain. However, finding a treatment that works well for you may require trying various approaches until the right one is found. Many people benefit from a combination of two or more types of treatment to control their pain. SYMPTOMS  Chronic pain can occur anywhere in the body and can range from mild to very severe. Some types of chronic pain include:  Headache.  Low back pain.  Cancer pain.  Arthritis pain.  Neurogenic pain. This is pain resulting from damage to nerves. People with chronic pain may also have other symptoms such as:  Depression.  Anger.  Insomnia.  Anxiety. DIAGNOSIS  Your health care provider will help diagnose your condition over time. In many cases, the initial focus will be on excluding possible conditions that could be causing the pain. Depending on your symptoms, your health care provider may order tests to diagnose your condition. Some of these tests may include:   Blood tests.   CT scan.   MRI.   X-rays.   Ultrasounds.   Nerve conduction studies.  You may need to see a specialist.  TREATMENT  Finding treatment that works well may take time. You may be referred to a pain specialist. He or she may prescribe medicine or therapies, such as:   Mindful meditation or yoga.  Shots (injections) of  numbing or pain-relieving medicines into the spine or area of pain.  Local electrical stimulation.  Acupuncture.   Massage therapy.   Aroma, color, light, or sound therapy.   Biofeedback.   Working with a physical therapist to keep from getting stiff.   Regular, gentle exercise.   Cognitive or behavioral therapy.   Group support.  Sometimes, surgery may be recommended.  HOME CARE INSTRUCTIONS   Take all medicines as directed by your health care provider.   Lessen stress in your life by relaxing and doing things such as listening to calming music.   Exercise or be active as directed by your health care provider.   Eat a healthy diet and include things such as vegetables, fruits, fish, and lean meats in your diet.   Keep all follow-up appointments with your health care provider.   Attend a support group with others suffering from chronic pain. SEEK MEDICAL CARE IF:   Your pain gets worse.   You develop a new pain that was not there before.   You cannot tolerate medicines given to you by your health care provider.   You have new symptoms since your last visit with your health care provider.  SEEK IMMEDIATE MEDICAL CARE IF:   You feel weak.   You have decreased sensation or numbness.   You lose control of bowel or bladder function.   Your pain suddenly gets much worse.   You develop shaking.  You develop chills.  You develop confusion.  You develop chest pain.  You develop shortness of breath.  MAKE SURE YOU:  Understand these instructions.  Will watch your condition.  Will get help right away if you are not doing well or get worse.   This information is not intended to replace advice given to you by your health care provider. Make sure you discuss any questions you have with your health care provider.   Document Released: 03/30/2002 Document Revised: 03/10/2013 Document Reviewed: 01/01/2013 Elsevier Interactive Patient  Education 2016 [employer]  Nausea and Vomiting Nausea is a sick feeling that often comes before throwing up (vomiting). Vomiting is a reflex where stomach contents come out of your mouth. Vomiting can cause severe loss of body fluids (dehydration). Children and elderly adults can become dehydrated quickly, especially if they also have diarrhea. Nausea and vomiting are symptoms of a condition or disease. It is important to find the cause of your symptoms. CAUSES   Direct irritation of the stomach lining. This irritation can result from increased acid production (gastroesophageal reflux disease), infection, food poisoning, taking certain medicines (such as nonsteroidal anti-inflammatory drugs), alcohol use, or tobacco use.  Signals from the brain.These signals could be caused by a headache, heat exposure, an inner ear disturbance, increased pressure in the brain from injury, infection, a tumor, or a concussion, pain, emotional stimulus, or metabolic problems.  An obstruction in the gastrointestinal tract (bowel obstruction).  Illnesses such as diabetes, hepatitis, gallbladder problems, appendicitis, kidney problems, cancer, sepsis, atypical symptoms of a heart attack, or eating disorders.  Medical treatments such as chemotherapy and radiation.  Receiving medicine that makes you sleep (general anesthetic) during surgery. DIAGNOSIS Your caregiver may ask for tests to be done if the problems do not improve after a few days. Tests may also be done if symptoms are severe or if the reason for the nausea and vomiting is not clear. Tests may include:  Urine tests.  Blood tests.  Stool tests.  Cultures (to look for evidence of infection).  X-rays or other imaging studies. Test results can help your caregiver make decisions about treatment or the need for additional tests. TREATMENT You need to stay well hydrated. Drink frequently but in small amounts.You may wish to drink water, sports  drinks, clear broth, or eat frozen ice pops or gelatin dessert to help stay hydrated.When you eat, eating slowly may help prevent nausea.There are also some antinausea medicines that may help prevent nausea. HOME CARE INSTRUCTIONS   Take all medicine as directed by your caregiver.  If you do not have an appetite, do not force yourself to eat. However, you must continue to drink fluids.  If you have an appetite, eat a normal diet unless your caregiver tells you differently.  Eat a variety of complex carbohydrates (rice, wheat, potatoes, bread), lean meats, yogurt, fruits, and vegetables.  Avoid high-fat foods because they are more difficult to digest.  Drink enough water and fluids to keep your urine clear or pale yellow.  If you are dehydrated, ask your caregiver for specific rehydration instructions. Signs of dehydration may include:  Severe thirst.  Dry lips and mouth.  Dizziness.  Dark urine.  Decreasing urine frequency and amount.  Confusion.  Rapid breathing or pulse. SEEK IMMEDIATE MEDICAL CARE IF:   You have blood or brown flecks (like coffee grounds) in your vomit.  You have black or bloody stools.  You have a severe headache or stiff neck.  You are confused.  You have severe abdominal pain.  You  have chest pain or trouble breathing.  You do not urinate at least once every 8 hours.  You develop cold or clammy skin.  You continue to vomit for longer than 24 to 48 hours.  You have a fever. MAKE SURE YOU:   Understand these instructions.  Will watch your condition.  Will get help right away if you are not doing well or get worse.   This information is not intended to replace advice given to you by your health care provider. Make sure you discuss any questions you have with your health care provider.   Document Released: 07/08/2005 Document Revised: 09/30/2011 Document Reviewed: 12/05/2010 Elsevier Interactive Patient Education 2016 [employer]  Urinary Tract Infection Urinary tract infections (UTIs) can develop anywhere along your urinary tract. Your urinary tract is your body's drainage system for removing wastes and extra water. Your urinary tract includes two kidneys, two ureters, a bladder, and a urethra. Your kidneys are a pair of bean-shaped organs. Each kidney is about the size of your fist. They are located below your ribs, one on each side of your spine. CAUSES Infections are caused by microbes, which are microscopic organisms, including fungi, viruses, and bacteria. These organisms are so small that they can only be seen through a microscope. Bacteria are the microbes that most commonly cause UTIs. SYMPTOMS  Symptoms of UTIs may vary by age and gender of the patient and by the location of the infection. Symptoms in young women typically include a frequent and intense urge to urinate and a painful, burning feeling in the bladder or urethra during urination. Older women and men are more likely to be tired, shaky, and weak and have muscle aches and abdominal pain. A fever may mean the infection is in your kidneys. Other symptoms of a kidney infection include pain in your back or sides below the ribs, nausea, and vomiting. DIAGNOSIS To diagnose a UTI, your caregiver will ask you about your symptoms. Your caregiver will also ask you to provide a urine sample. The urine sample will be tested for bacteria and white blood cells. White blood cells are made by your body to help fight infection. TREATMENT  Typically, UTIs can be treated with medication. Because most UTIs are caused by a bacterial infection, they usually can be treated with the use of antibiotics. The choice of antibiotic and length of treatment depend on your symptoms and the type of bacteria causing your infection. HOME CARE INSTRUCTIONS  If you were prescribed antibiotics, take them exactly as your caregiver instructs you. Finish the medication even if you feel better  after you have only taken some of the medication.  Drink enough water and fluids to keep your urine clear or pale yellow.  Avoid caffeine, tea, and carbonated beverages. They tend to irritate your bladder.  Empty your bladder often. Avoid holding urine for long periods of time.  Empty your bladder before and after sexual intercourse.  After a bowel movement, women should cleanse from front to back. Use each tissue only once. SEEK MEDICAL CARE IF:   You have back pain.  You develop a fever.  Your symptoms do not begin to resolve within 3 days. SEEK IMMEDIATE MEDICAL CARE IF:   You have severe back pain or lower abdominal pain.  You develop chills.  You have nausea or vomiting.  You have continued burning or discomfort with urination. MAKE SURE YOU:   Understand these instructions.  Will watch your condition.  Will get help right  away if you are not doing well or get worse.   This information is not intended to replace advice given to you by your health care provider. Make sure you discuss any questions you have with your health care provider.   Document Released: 04/17/2005 Document Revised: 03/29/2015 Document Reviewed: 08/16/2011 Elsevier Interactive Patient Education 2016 Else[employer] Inc.  Abdominal Pain, Adult Many things can cause abdominal pain. Usually, abdominal pain is not caused by a disease and will improve without treatment. It can often be observed and treated at home. Your health care provider will do a physical exam and possibly order blood tests and X-rays to help determine the seriousness of your pain. However, in many cases, more time must pass before a clear cause of the pain can be found. Before that point, your health care provider may not know if you need more testing or further treatment. HOME CARE INSTRUCTIONS Monitor your abdominal pain for any changes. The following actions may help to alleviate any discomfort you are experiencing:  Only take  over-the-counter or prescription medicines as directed by your health care provider.  Do not take laxatives unless directed to do so by your health care provider.  Try a clear liquid diet (broth, tea, or water) as directed by your health care provider. Slowly move to a bland diet as tolerated. SEEK MEDICAL CARE IF:  You have unexplained abdominal pain.  You have abdominal pain associated with nausea or diarrhea.  You have pain when you urinate or have a bowel movement.  You experience abdominal pain that wakes you in the night.  You have abdominal pain that is worsened or improved by eating food.  You have abdominal pain that is worsened with eating fatty foods.  You have a fever. SEEK IMMEDIATE MEDICAL CARE IF:  Your pain does not go away within 2 hours.  You keep throwing up (vomiting).  Your pain is felt only in portions of the abdomen, such as the right side or the left lower portion of the abdomen.  You pass bloody or black tarry stools. MAKE SURE YOU:  Understand these instructions.  Will watch your condition.  Will get help right away if you are not doing well or get worse.   This information is not intended to replace advice given to you by your health care provider. Make sure you discuss any questions you have with your health care provider.   Document Released: 04/17/2005 Document Revised: 03/29/2015 Document Reviewed: 03/17/2013 Elsevier Interactive Patient Education [employer]

## 2015-10-20 LAB — URINE CULTURE
Culture: 4000
Special Requests: NORMAL

## 2015-11-28 ENCOUNTER — Emergency Department (HOSPITAL_COMMUNITY)
Admission: EM | Admit: 2015-11-28 | Discharge: 2015-11-28 | Disposition: A | Discharge status: Home / Self Care | Payer: Self-pay | Attending: Emergency Medicine | Admitting: Emergency Medicine

## 2015-11-28 ENCOUNTER — Encounter (HOSPITAL_COMMUNITY): Payer: Self-pay

## 2015-11-28 ENCOUNTER — Emergency Department (EMERGENCY_DEPARTMENT_HOSPITAL)
Admit: 2015-11-28 | Discharge: 2015-11-28 | Disposition: A | Discharge status: Home / Self Care | Payer: Self-pay | Attending: Emergency Medicine | Admitting: Emergency Medicine

## 2015-11-28 DIAGNOSIS — I82511 Chronic embolism and thrombosis of right femoral vein: Secondary | ICD-10-CM | POA: Insufficient documentation

## 2015-11-28 DIAGNOSIS — Z7901 Long term (current) use of anticoagulants: Secondary | ICD-10-CM | POA: Insufficient documentation

## 2015-11-28 DIAGNOSIS — Z79899 Other long term (current) drug therapy: Secondary | ICD-10-CM | POA: Insufficient documentation

## 2015-11-28 DIAGNOSIS — M79609 Pain in unspecified limb: Secondary | ICD-10-CM

## 2015-11-28 DIAGNOSIS — Z933 Colostomy status: Secondary | ICD-10-CM | POA: Insufficient documentation

## 2015-11-28 DIAGNOSIS — Z87891 Personal history of nicotine dependence: Secondary | ICD-10-CM | POA: Insufficient documentation

## 2015-11-28 DIAGNOSIS — M7989 Other specified soft tissue disorders: Secondary | ICD-10-CM

## 2015-11-28 DIAGNOSIS — M25471 Effusion, right ankle: Secondary | ICD-10-CM | POA: Insufficient documentation

## 2015-11-28 DIAGNOSIS — Z87828 Personal history of other (healed) physical injury and trauma: Secondary | ICD-10-CM | POA: Insufficient documentation

## 2015-11-28 LAB — BASIC METABOLIC PANEL
Anion gap: 12 (ref 5–15)
BUN: 12 mg/dL (ref 6–20)
CO2: 20 mmol/L — ABNORMAL LOW (ref 22–32)
Calcium: 9.8 mg/dL (ref 8.9–10.3)
Chloride: 107 mmol/L (ref 101–111)
Creatinine, Ser: 1.04 mg/dL (ref 0.61–1.24)
GFR calc Af Amer: >60 mL/min (ref >60)
GFR calc non Af Amer: >60 mL/min (ref >60)
Glucose, Bld: 100 mg/dL — ABNORMAL HIGH (ref 65–99)
Potassium: 3.5 mmol/L (ref 3.5–5.1)
Sodium: 139 mmol/L (ref 135–145)

## 2015-11-28 LAB — CBC
HCT: 32.1 % — ABNORMAL LOW (ref 39.0–52.0)
Hemoglobin: 10.7 g/dL — ABNORMAL LOW (ref 13.0–17.0)
MCH: 29.7 pg (ref 26.0–34.0)
MCHC: 33.3 g/dL (ref 30.0–36.0)
MCV: 89.2 fL (ref 78.0–100.0)
Platelets: 352 10*3/uL (ref 150–400)
RBC: 3.6 MIL/uL — ABNORMAL LOW (ref 4.22–5.81)
RDW: 15.9 % — ABNORMAL HIGH (ref 11.5–15.5)
WBC: 12.6 10*3/uL — ABNORMAL HIGH (ref 4.0–10.5)

## 2015-11-28 MED ORDER — OXYCODONE-ACETAMINOPHEN 5-325 MG PO TABS: 1.0000 | ORAL_TABLET | ORAL | Status: DC | PRN

## 2015-11-28 MED ORDER — OXYCODONE-ACETAMINOPHEN 5-325 MG PO TABS
1.0000 | ORAL_TABLET | Freq: Once | ORAL | Status: AC
  Administered 2015-11-28: 1 via ORAL
  Filled 2015-11-28: qty 1
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
## 2015-11-28 NOTE — Progress Notes (Signed)
VASCULAR LAB PRELIMINARY  PRELIMINARY  PRELIMINARY  PRELIMINARY  Right lower extremity venous duplex completed.    Preliminary report:  Right: Chronic DVT noted in the common femoral and femoral veins .  No evidence of superficial thrombosis.  No Baker's cyst.  Shamra Bradeen, RVS 11/28/2015, 12:25 PM

## 2015-11-28 NOTE — ED Notes (Signed)
Doctor at bedside.
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
## 2015-12-07 NOTE — ED Notes (Signed)
Pt arrived asking for colostomy supplies.  Pt given a single colostomy bag and informed that he needs to follow up w/ his PCP for further supplies.  Raymond [name] w/ Case management made aware and had no further direction.

## 2016-01-03 ENCOUNTER — Emergency Department (HOSPITAL_COMMUNITY): Admission: EM | Admit: 2016-01-03 | Discharge: 2016-01-03 | Discharge status: Absent without leave | Payer: MEDICAID

## 2016-01-04 ENCOUNTER — Encounter (HOSPITAL_COMMUNITY): Payer: Self-pay | Admitting: Family Medicine

## 2016-01-04 ENCOUNTER — Emergency Department (HOSPITAL_COMMUNITY)
Admission: EM | Admit: 2016-01-04 | Discharge: 2016-01-04 | Disposition: A | Discharge status: Home / Self Care | Payer: MEDICAID | Attending: Emergency Medicine | Admitting: Emergency Medicine

## 2016-01-04 DIAGNOSIS — Z7901 Long term (current) use of anticoagulants: Secondary | ICD-10-CM | POA: Insufficient documentation

## 2016-01-04 DIAGNOSIS — K9410 Enterostomy complication, unspecified: Secondary | ICD-10-CM | POA: Insufficient documentation

## 2016-01-04 DIAGNOSIS — Z432 Encounter for attention to ileostomy: Secondary | ICD-10-CM

## 2016-01-04 DIAGNOSIS — Z87891 Personal history of nicotine dependence: Secondary | ICD-10-CM | POA: Insufficient documentation

## 2016-01-04 MED ORDER — SILVER NITRATE-POT NITRATE 75-25 % EX MISC
10.0000 | Freq: Once | CUTANEOUS | Status: AC
  Administered 2016-01-04: 10 via TOPICAL
  Filled 2016-01-04: qty 10

## 2016-01-04 NOTE — Consult Note (Addendum)
WOC ostomy consult note  [facility] 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 He presents for skin breakdown at ostomy site, however he also is noted to be out of ostomy supplies.  Current pouching system is leaking, WOC nurse asked to see patient in ED for pouching issues.  He reports 3 day wear time,  which is perfect for a high output ileostomy.  I have explained this to him today.  He reports he ordered supplies but they have not arrived to his home yet. I feel he may be in the ED for this reason.   Stoma type/location: 1 1/8" slightly oval shaped, RLQ Stomal assessment/size: pink, moist, flush with the skin Peristomal assessment: intact, mild irritation at medial side of the stoma but not at the stoma  Treatment options for stomal/peristomal skin: used ostomy powder to crust skin, send home with patient for use Output liquid green Ostomy pouching: 1pc.convex  Education provided: instructed on pouch change frequency, does not need to be more than 3 days or so.  Peristomal skin will get irritated quickly from the output.  He verbalized understanding.  I have provided patient with powder, paste, and 3 convex wafers.  I have requested [employer] to send some additional supplies to the patient as well.  He reportedly has been paying out of pocket for his ostomy supplies because he can not get MCD approved.  He apparently is out of supplies and I feel this may be why he came into the hospital.  Discussed with bedside nurse and ED MD.   Discussed POC with patient and bedside nurse.  Re consult if needed, will not follow at this time. Thanks  Duc Crocket [employer] RN, CWOCN [phone])

## 2016-01-04 NOTE — ED Notes (Signed)
Pt comfortable with discharge and follow up instructions. No Rx.

## 2016-01-04 NOTE — ED Notes (Signed)
Pt presents from POV with c/o ostomy bag not adhering to ostomy site and skin breakdown around ostomy. Pt is A&Ox4, no NVD fevers/chills.
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
## 2016-01-04 NOTE — Discharge Instructions (Signed)
Ileostomy Home Guide An ileostomy is an opening for stool to leave your body when a medical condition prevents it from leaving through the usual opening (rectum). During a surgery, a piece of small intestine (ileum) is brought through a hole in the abdominal wall. The new opening is called a stoma or ostomy. A bag or pouch fits over the stoma to catch stool and gas. Your stool may be liquid at first. Over time, it may become about the consistency of applesauce. CARING FOR YOUR STOMA Normally, the stoma looks a lot like the inside of your cheek: pink and moist. At first, it may be swollen, but this swelling will decrease within 6 weeks. Keep the skin around the stoma clean and dry. You can gently wash your stoma in the shower with a clean, soft washcloth. If you develop any skin irritation, your caregiver may give you a stoma powder or ointment to help heal the area. Do not use any products other than those specifically given to you by your caregiver.  Your stoma should not be uncomfortable. If you notice any stinging or burning, your pouch may be leaking, and the skin around your stoma may be coming into contact with stool. This can cause skin irritation. If you notice stinging, you should replace your pouch with a new one and discard the old one. OSTOMY POUCHES The pouch that fits over the ostomy can be made up of either 1 or 2 pieces. A one-piece pouch has a skin barrier piece and the pouch itself in one unit. A two-piece pouch has a skin barrier with a separate pouch that snaps on and off of the skin barrier. Either way, you should empty the pouch when it is only  to  full. Do not let more stool or gas build up. This could cause the pouch to leak. Some ostomy bags have a built-in gas release valve. Ostomy deodorizer (5 drops) can be put into the pouch to prevent odor. Some people use an ostomy lubricant to help the stool slide out of the bag more easily and completely.  EMPTYING YOUR OSTOMY POUCH You  may get lessons on how to empty your pouch from a wound-ostomy nurse before you leave the hospital. Here are the basic steps:  Wash your hands with soap and water.  Sit far back on the toilet.  Put pieces of toilet paper into the toilet water. This will prevent splashing as you empty the stool into the toilet bowl.  Unclip or unvelcro the tail end of the pouch.  Unroll the tail and empty stool into the toilet.  Clean the tail with toilet paper.  Reroll the tail, and clip or velcro it closed.  Wash your hands again. CHANGING YOUR OSTOMY POUCH Change your ostomy pouch about every 3 to 4 days for the first 6 weeks, then every 5 to 7 days. Always change the bag sooner if you begin to notice any discomfort or irritation of the skin around the stoma. When possible, plan to change your ostomy pouching system before eating and drinking because this will lessen the chance of stool coming out during the change. A wound-ostomy nurse may teach you how to change your pouch before you leave the hospital. Here are the basic steps:  Lay out your supplies.  Wash your hands with soap and water.  Carefully remove the old pouch.  Wash the stoma and the skin around the stoma. Allow the area to dry. Men may be advised to shave any   hair around the stoma very carefully. This will make the adhesive stick better.  Use the stoma measuring guide that comes with your pouch set to decide what size hole you will need to cut in the skin barrier piece. Choose the smallest possible size that will hold the stoma but will not touch it.  Use the guide to trace the circle on the back of the skin barrier piece. Cut out the hole.  Hold the skin barrier piece over the stoma to make sure the hole is the correct size.  Remove the adhesive paper backing from the skin barrier piece.  Squeeze stoma paste around the opening of the skin barrier piece.  Clean and dry the skin around the stoma again.  Carefully fit the skin  barrier piece over your stoma.  If you are using a two-piece pouch, snap the pouch onto the skin barrier piece.  Close the tail of the pouch.  Put your hand over the top of the skin barrier piece to help warm it for about 5 minutes, so that it conforms to your body better.  Wash your hands again. DIET TIPS Because you have a higher risk of a blockage in the first 2 months after getting an ileostomy, you should decrease your fiber intake for that time period. Fibrous foods include:  Celery.  Cabbage (and coleslaw).  Pineapple.  Mushrooms.  Corn and popcorn.  Whole fruits and vegetables, especially with the skins on.  Foods that contain seeds.  Oranges, grapefruit, tangerines, and other citrus fruit.  Nuts. After about 2 months, you can slowly add these kinds of foods back into your regular diet, as tolerated. Other tips:  Drink about eight, 8 oz glasses of water each day.  You can prevent gas by eating slowly and chewing your food thoroughly.  If you feel concerned that you have too much gas, you can cut back on gas-producing foods, such as:  Spicy foods.  Onions and garlic.  Cruciferous vegetables (cabbage, broccoli, cauliflower, Brussels sprouts).  Beans and legumes.  Some cheeses.  Eggs.  Fish.  Bubbly (carbonated) drinks.  Chewing gum. GENERAL TIPS  You can shower with or without the bag in place.  Always keep the bag snapped on if you are bathing or swimming.  If your bag gets wet, you can dry it with a blow-dryer set to cool.  Avoid wearing tight clothing directly over your stoma so that it does not become irritated or bleed. Tight clothing can also prevent the stool from draining into the pouch, which can cause it to leak.  It is helpful to always have an extra skin barrier and pouch with you when traveling. Do not leave them anywhere too warm, because parts of them can melt.  Do not let your seat belt rest on your stoma. Try to keep the seat  belt either above or below your stoma, or use a tiny pillow to cushion it.  You can still participate in sports, but you should avoid activities in which there is a risk of getting hit in the abdomen.  You can still have sex. It is a good idea to empty your pouch prior to sex. Some people and their partners feel very comfortable seeing the pouch during sex. Others choose to wear lingerie or a T-shirt that covers the device. SEEK IMMEDIATE MEDICAL CARE IF:  You feel dizzy, light-headed, or faint.  You measure pouch drainage of more than 1,500 mL per day. This amount of drainage can lead   to dehydration.  You notice a change in the size or color of the stoma, especially if it becomes very red, purple, black, or pale white.  You have bloody stools or bleeding from the ostomy.  You have abdominal pain, nausea, vomiting, or bloating.  There is anything unusual protruding from the ostomy.  You have irritation or red skin around the ostomy.  No stool is passing from the stoma.  You have diarrhea (requiring pouch emptying more frequently than normal).   This information is not intended to replace advice given to you by your health care provider. Make sure you discuss any questions you have with your health care provider.   Document Released: 07/11/2003 Document Revised: 07/29/2014 Document Reviewed: 12/05/2010 Elsevier Interactive Patient Education 2016 [employer]  

## 2016-01-17 ENCOUNTER — Encounter (HOSPITAL_COMMUNITY): Payer: Self-pay | Admitting: Emergency Medicine

## 2016-01-17 ENCOUNTER — Emergency Department (HOSPITAL_COMMUNITY)
Admission: EM | Admit: 2016-01-17 | Discharge: 2016-01-17 | Disposition: A | Discharge status: Home / Self Care | Payer: MEDICAID | Attending: Emergency Medicine | Admitting: Emergency Medicine

## 2016-01-17 DIAGNOSIS — Z433 Encounter for attention to colostomy: Secondary | ICD-10-CM

## 2016-01-17 DIAGNOSIS — Z79899 Other long term (current) drug therapy: Secondary | ICD-10-CM | POA: Insufficient documentation

## 2016-01-17 DIAGNOSIS — Z87891 Personal history of nicotine dependence: Secondary | ICD-10-CM | POA: Insufficient documentation

## 2016-01-17 DIAGNOSIS — K9403 Colostomy malfunction: Secondary | ICD-10-CM | POA: Insufficient documentation

## 2016-01-17 NOTE — ED Notes (Signed)
Bed: WA04 Expected date:  Expected time:  Means of arrival:  Comments: EMS- colostomy issue

## 2016-01-17 NOTE — Discharge Instructions (Signed)
1. Medications: usual home medications 2. Treatment: rest, drink plenty of fluids,  3. Follow Up: Please followup with your primary doctor in 2-3 days for discussion of your diagnoses and further evaluation after today's visit; if you do not have a primary care doctor use the resource guide provided to find one; Please return to the ER for signs of infection    Colostomy Home Guide A colostomy is an opening for stool to leave your body when a medical condition prevents it from leaving through the usual opening (rectum). During a surgery, a piece of large intestine (colon) is brought through a hole in the abdominal wall. The new opening is called a stoma or ostomy. A bag or pouch fits over the stoma to catch stool and gas. Your stool may be liquid, somewhat pasty, or formed. CARING FOR YOUR STOMA  Normally, the stoma looks a lot like the inside of your cheek: pink, red, and moist. At first it may be swollen, but this swelling will decrease within 6 weeks. Keep the skin around your stoma clean and dry. You can gently wash your stoma and the skin around your stoma in the shower with a clean, soft washcloth. If you develop any skin irritation, your caregiver may give you a stoma powder or ointment to help heal the area. Do not use any products other than those specifically given to you by your caregiver.  Your stoma should not be uncomfortable. If you notice any stinging or burning, your pouch may be leaking, and the skin around your stoma may be coming into contact with stool. This can cause skin irritation. If you notice stinging, replace your pouch with a new one and discard the old one. OSTOMY POUCHES  The pouch that fits over the ostomy can be made up of either 1 or 2 pieces. A one-piece pouch has a skin barrier piece and the pouch itself in one unit. A two-piece pouch has a skin barrier with a separate pouch that snaps on and off of the skin barrier. Either way, you should empty the pouch when it is  only  to  full. Do not let more stool or gas build up. This could cause the pouch to leak. Some ostomy bags have a built-in gas release valve. Ostomy deodorizer (5 drops) can be put into the pouch to prevent odor. Some people use ostomy lubricant drops inside the pouch to help the stool slide out of the bag more easily and completely.  EMPTYING YOUR OSTOMY POUCH  You may get lessons on how to empty your pouch from a wound-ostomy nurse before you leave the hospital. Here are the basic steps:  Wash your hands with soap and water.  Sit far back on the toilet.  Put several pieces of toilet paper into the toilet water. This will prevent splashing as you empty the stool into the toilet bowl.  Unclip or unvelcro the tail end of the pouch.  Unroll the tail and empty stool into the toilet.  Clean the tail with toilet paper.  Reroll the tail, and clip or velcro it closed.  Wash your hands again. CHANGING YOUR OSTOMY POUCH  Change your ostomy pouch about every 3 to 4 days for the first 6 weeks, then every 5 to7 days. Always change the bag sooner if there is any leakage or you begin to notice any discomfort or irritation of the skin around the stoma. When possible, plan to change your ostomy pouch before eating or drinking as this  will lessen the chance of stool coming out during the pouch change. A wound-ostomy nurse may teach you how to change your pouch before you leave the hospital. Here are the basic steps:  Lay out your supplies.  Wash your hands with soap and water.  Carefully remove the old pouch.  Wash the stoma and allow it to dry. Men may be advised to shave any hair around the stoma very carefully. This will make the adhesive stick better.  Use the stoma measuring guide that comes with your pouch set to decide what size hole you will need to cut in the skin barrier piece. Choose the smallest possible size that will hold the stoma but will not touch it.  Use the guide to trace the  circle on the back of the skin barrier piece. Cut out the hole.  Hold the skin barrier piece over the stoma to make sure the hole is the correct size.  Remove the adhesive paper backing from the skin barrier piece.  Squeeze stoma paste around the opening of the skin barrier piece.  Clean and dry the skin around the stoma again.  Carefully fit the skin barrier piece over your stoma.  If you are using a two-piece pouch, snap the pouch onto the skin barrier piece.  Close the tail of the pouch.  Put your hand over the top of the skin barrier piece to help warm it for about 5 minutes, so that it conforms to your body better.  Wash your hands again. DIET TIPS   Continue to follow your usual diet.  Drink about eight 8 oz glasses of water each day.  You can prevent gas by eating slowly and chewing your food thoroughly.  If you feel concerned that you have too much gas, you can cut back on gas-producing foods, such as:  Spicy foods.  Onions and garlic.  Cruciferous vegetables (cabbage, broccoli, cauliflower, Brussels sprouts).  Beans and legumes.  Some cheeses.  Eggs.  Fish.  Bubbly (carbonated) drinks.  Chewing gum. GENERAL TIPS   You can shower with or without the bag in place.  Always keep the bag on if you are bathing or swimming.  If your bag gets wet, you can dry it with a blow-dryer set to cool.  Avoid wearing tight clothing directly over your stoma so that it does not become irritated or bleed. Tight clothing can also prevent stool from draining into the pouch.  It is helpful to always have an extra skin barrier and pouch with you when traveling. Do not leave them anywhere too warm, as parts of them can melt.  Do not let your seat belt rest on your stoma. Try to keep the seat belt either above or below your stoma, or use a tiny pillow to cushion it.  You can still participate in sports, but you should avoid activities in which there is a risk of getting hit  in the abdomen.  You can still have sex. It is a good idea to empty your pouch prior to sex. Some people and their partners feel very comfortable seeing the pouch during sex. Others choose to wear lingerie or a T-shirt that covers the device. SEEK IMMEDIATE MEDICAL CARE IF:  You notice a change in the size or color of the stoma, especially if it becomes very red, purple, black, or pale white.  You have bloody stools or bleeding from the stoma.  You have abdominal pain, nausea, vomiting, or bloating.  There is anything  unusual protruding from the stoma.  You have irritation or red skin around the stoma.  No stool is passing from the stoma.  You have diarrhea (requiring more frequent than normal pouch emptying).   This information is not intended to replace advice given to you by your health care provider. Make sure you discuss any questions you have with your health care provider.   Document Released: 07/11/2003 Document Revised: 09/30/2011 Document Reviewed: 12/05/2010 Elsevier Interactive Patient Education [employer]
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
## 2016-01-17 NOTE — ED Notes (Signed)
NEW OSTOMY BAG GIVEN TO PT. ALONG WITH KIT AND INSTRUSTIONS
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
## 2016-01-17 NOTE — ED Notes (Signed)
Bag with guaze and paper tape provided for pt at discharge. Pt has taken the paper tape and put in around the adhearing part of the ostomy bag. Pt instructed not at add more tape due that fact that it could cause break down of the device. Pt ignored instructions and ideas, placing tape around the area.

## 2016-01-18 ENCOUNTER — Emergency Department (HOSPITAL_COMMUNITY)
Admission: EM | Admit: 2016-01-18 | Discharge: 2016-01-18 | Disposition: A | Discharge status: Home / Self Care | Payer: MEDICAID | Attending: Emergency Medicine | Admitting: Emergency Medicine

## 2016-01-18 ENCOUNTER — Encounter (HOSPITAL_COMMUNITY): Payer: Self-pay | Admitting: Emergency Medicine

## 2016-01-18 DIAGNOSIS — Z432 Encounter for attention to ileostomy: Secondary | ICD-10-CM | POA: Insufficient documentation

## 2016-01-18 DIAGNOSIS — Z87891 Personal history of nicotine dependence: Secondary | ICD-10-CM | POA: Insufficient documentation

## 2016-01-18 NOTE — ED Notes (Signed)
Patient called out requesting consult for WOC nurse due to colostomy bag leakage. Greta DoomBowie PA made aware and verbal order given.

## 2016-01-18 NOTE — ED Notes (Signed)
Pt here for eval of ostomy bag that is burning and not sticking well per pt
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
## 2016-01-18 NOTE — Discharge Instructions (Signed)
Ileostomy Home Guide An ileostomy is an opening for stool to leave your body when a medical condition prevents it from leaving through the usual opening (rectum). During a surgery, a piece of small intestine (ileum) is brought through a hole in the abdominal wall. The new opening is called a stoma or ostomy. A bag or pouch fits over the stoma to catch stool and gas. Your stool may be liquid at first. Over time, it may become about the consistency of applesauce. CARING FOR YOUR STOMA Normally, the stoma looks a lot like the inside of your cheek: pink and moist. At first, it may be swollen, but this swelling will decrease within 6 weeks. Keep the skin around the stoma clean and dry. You can gently wash your stoma in the shower with a clean, soft washcloth. If you develop any skin irritation, your caregiver may give you a stoma powder or ointment to help heal the area. Do not use any products other than those specifically given to you by your caregiver.  Your stoma should not be uncomfortable. If you notice any stinging or burning, your pouch may be leaking, and the skin around your stoma may be coming into contact with stool. This can cause skin irritation. If you notice stinging, you should replace your pouch with a new one and discard the old one. OSTOMY POUCHES The pouch that fits over the ostomy can be made up of either 1 or 2 pieces. A one-piece pouch has a skin barrier piece and the pouch itself in one unit. A two-piece pouch has a skin barrier with a separate pouch that snaps on and off of the skin barrier. Either way, you should empty the pouch when it is only  to  full. Do not let more stool or gas build up. This could cause the pouch to leak. Some ostomy bags have a built-in gas release valve. Ostomy deodorizer (5 drops) can be put into the pouch to prevent odor. Some people use an ostomy lubricant to help the stool slide out of the bag more easily and completely.  EMPTYING YOUR OSTOMY POUCH You  may get lessons on how to empty your pouch from a wound-ostomy nurse before you leave the hospital. Here are the basic steps:  Wash your hands with soap and water.  Sit far back on the toilet.  Put pieces of toilet paper into the toilet water. This will prevent splashing as you empty the stool into the toilet bowl.  Unclip or unvelcro the tail end of the pouch.  Unroll the tail and empty stool into the toilet.  Clean the tail with toilet paper.  Reroll the tail, and clip or velcro it closed.  Wash your hands again. CHANGING YOUR OSTOMY POUCH Change your ostomy pouch about every 3 to 4 days for the first 6 weeks, then every 5 to 7 days. Always change the bag sooner if you begin to notice any discomfort or irritation of the skin around the stoma. When possible, plan to change your ostomy pouching system before eating and drinking because this will lessen the chance of stool coming out during the change. A wound-ostomy nurse may teach you how to change your pouch before you leave the hospital. Here are the basic steps:  Lay out your supplies.  Wash your hands with soap and water.  Carefully remove the old pouch.  Wash the stoma and the skin around the stoma. Allow the area to dry. Men may be advised to shave any   hair around the stoma very carefully. This will make the adhesive stick better.  Use the stoma measuring guide that comes with your pouch set to decide what size hole you will need to cut in the skin barrier piece. Choose the smallest possible size that will hold the stoma but will not touch it.  Use the guide to trace the circle on the back of the skin barrier piece. Cut out the hole.  Hold the skin barrier piece over the stoma to make sure the hole is the correct size.  Remove the adhesive paper backing from the skin barrier piece.  Squeeze stoma paste around the opening of the skin barrier piece.  Clean and dry the skin around the stoma again.  Carefully fit the skin  barrier piece over your stoma.  If you are using a two-piece pouch, snap the pouch onto the skin barrier piece.  Close the tail of the pouch.  Put your hand over the top of the skin barrier piece to help warm it for about 5 minutes, so that it conforms to your body better.  Wash your hands again. DIET TIPS Because you have a higher risk of a blockage in the first 2 months after getting an ileostomy, you should decrease your fiber intake for that time period. Fibrous foods include:  Celery.  Cabbage (and coleslaw).  Pineapple.  Mushrooms.  Corn and popcorn.  Whole fruits and vegetables, especially with the skins on.  Foods that contain seeds.  Oranges, grapefruit, tangerines, and other citrus fruit.  Nuts. After about 2 months, you can slowly add these kinds of foods back into your regular diet, as tolerated. Other tips:  Drink about eight, 8 oz glasses of water each day.  You can prevent gas by eating slowly and chewing your food thoroughly.  If you feel concerned that you have too much gas, you can cut back on gas-producing foods, such as:  Spicy foods.  Onions and garlic.  Cruciferous vegetables (cabbage, broccoli, cauliflower, Brussels sprouts).  Beans and legumes.  Some cheeses.  Eggs.  Fish.  Bubbly (carbonated) drinks.  Chewing gum. GENERAL TIPS  You can shower with or without the bag in place.  Always keep the bag snapped on if you are bathing or swimming.  If your bag gets wet, you can dry it with a blow-dryer set to cool.  Avoid wearing tight clothing directly over your stoma so that it does not become irritated or bleed. Tight clothing can also prevent the stool from draining into the pouch, which can cause it to leak.  It is helpful to always have an extra skin barrier and pouch with you when traveling. Do not leave them anywhere too warm, because parts of them can melt.  Do not let your seat belt rest on your stoma. Try to keep the seat  belt either above or below your stoma, or use a tiny pillow to cushion it.  You can still participate in sports, but you should avoid activities in which there is a risk of getting hit in the abdomen.  You can still have sex. It is a good idea to empty your pouch prior to sex. Some people and their partners feel very comfortable seeing the pouch during sex. Others choose to wear lingerie or a T-shirt that covers the device. SEEK IMMEDIATE MEDICAL CARE IF:  You feel dizzy, light-headed, or faint.  You measure pouch drainage of more than 1,500 mL per day. This amount of drainage can lead   to dehydration.  You notice a change in the size or color of the stoma, especially if it becomes very red, purple, black, or pale white.  You have bloody stools or bleeding from the ostomy.  You have abdominal pain, nausea, vomiting, or bloating.  There is anything unusual protruding from the ostomy.  You have irritation or red skin around the ostomy.  No stool is passing from the stoma.  You have diarrhea (requiring pouch emptying more frequently than normal).   This information is not intended to replace advice given to you by your health care provider. Make sure you discuss any questions you have with your health care provider.   Document Released: 07/11/2003 Document Revised: 07/29/2014 Document Reviewed: 12/05/2010 Elsevier Interactive Patient Education 2016 [employer]  

## 2016-01-18 NOTE — ED Notes (Signed)
Pt requesting to see a wound care nurse -- and states that he needs to empty colostomy -- given urinal and sent to bathroom to empty it himself. Also asked to have new colostomy bag ordered. [place] [phone].

## 2016-01-18 NOTE — Consult Note (Addendum)
WOC ostomy consult note Pt in the ER with ostomy leakage; states his pouch will not last more than 24 hours.  [facility].  He is well-informed regarding pouch application and routines, but his Medicaid has not been approved in 9 months and he has gone though all the pouches he had at home related to leakage recently.  He has a very difficult pouching situation related to a stoma which is flush with skin level and located near his groin crease on the RLQ.  It has been leaking large amts of liquid green drainage which has caused itching and burning to the peristomal skin. Stoma type/location: Ileostomy stoma red and viable, flush with skin level, 1 inch. Peristomal assessment:  Red and macerated to 1 cm area surrounding stoma  Treatment options for stomal/peristomal skin:  Pt is aware of using crusting technique for skin maceration but did not have any more supplies of pouches, powder, or skin prep.   Output: Large amt liquid green liquid drainage Ostomy pouching: 1pc.  Education provided: Applied one piece flexible convex pouch.  Discussed troubleshooting strategies and gave pt 2 boxes of sample ostomy convex pouches.  Pt has an appointment with Dr Lindie SpruceWyatt tomorrow and will discuss possible thickening methods with the surgeon.  Denies further questions at this time. Please re-consult if further assistance is needed.  Thank-you,  Cammie Mcgeeawn Aadvika Konen MSN, RN, CWOCN, ByronWCN-AP, CNS [phone]

## 2016-01-18 NOTE — ED Notes (Signed)
Pt stable, ambulatory, states understanding of discharge instructions 

## 2016-01-30 ENCOUNTER — Encounter (HOSPITAL_COMMUNITY): Payer: Self-pay | Admitting: Emergency Medicine

## 2016-01-30 ENCOUNTER — Emergency Department (HOSPITAL_COMMUNITY)
Admission: EM | Admit: 2016-01-30 | Discharge: 2016-01-30 | Disposition: A | Discharge status: Home / Self Care | Payer: MEDICAID | Attending: Emergency Medicine | Admitting: Emergency Medicine

## 2016-01-30 DIAGNOSIS — K9400 Colostomy complication, unspecified: Secondary | ICD-10-CM | POA: Insufficient documentation

## 2016-01-30 DIAGNOSIS — R1031 Right lower quadrant pain: Secondary | ICD-10-CM

## 2016-01-30 DIAGNOSIS — Z7901 Long term (current) use of anticoagulants: Secondary | ICD-10-CM | POA: Insufficient documentation

## 2016-01-30 DIAGNOSIS — Z7189 Other specified counseling: Secondary | ICD-10-CM

## 2016-01-30 DIAGNOSIS — Z87891 Personal history of nicotine dependence: Secondary | ICD-10-CM | POA: Insufficient documentation

## 2016-01-30 MED ORDER — ONDANSETRON 4 MG PO TBDP
8.0000 mg | ORAL_TABLET | Freq: Once | ORAL | Status: AC
  Administered 2016-01-30: 8 mg via ORAL
  Filled 2016-01-30: qty 2

## 2016-01-30 MED ORDER — OXYCODONE-ACETAMINOPHEN 5-325 MG PO TABS
2.0000 | ORAL_TABLET | Freq: Once | ORAL | Status: AC
  Administered 2016-01-30: 2 via ORAL
  Filled 2016-01-30: qty 2
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
## 2016-01-30 NOTE — Consult Note (Signed)
WOC consult consult note Pt in the ER with ostomy pain, his current pouch is intact.  He reports however  [facility]. He is well-informed regarding pouch application and routines, but his Medicaid has not been approved in 9 months. He reports also not using the Immodium (4x day) as recommended by his surgeon to thicken stools because "I ran out".   He has a very difficult pouching situation related to a stoma which is flush with skin level and located near his groin crease on the RLQ. It has been leaking large amts of liquid green drainage which has caused itching and burning to the peristomal skin. Stoma type/location: Ileostomy in the RLQ, however no stoma, only opening in the skin. The skin around the slit has hypergranulated from excessive exposure to effluent.   Peristomal assessment: Red and macerated to 1 cm area surrounding stoma  Treatment options for stomal/peristomal skin:I crusted the skin today, however when I showed the patient he has been crusting the intact skin. Reeducated on use of ostomy powder and its indication.   Output: Large amt liquid green liquid drainage Ostomy pouching: 1pc.convex  Education provided: Applied one piece convex pouch. Discussed troubleshooting strategies and provided 6 convex pouches, ostomy powder and new belt. Unfortunately this patient has a history of coming into the ED when he runs out of supplies, the pouch in place today was the last of [facility] his last ED visit.  He can not see Dr. Lindie SpruceWyatt because per the patient "I owe them money and they wont see me". [facility] and has an appointment there per patient on Monday at 3pm.  He traveled by EMS today to the ED. I have asked the ED staff about SW /CM to asssist patient with obtaining Immodium as this might thicken stool and lessen the frequency at which [name] must change his pouch. Very unfortunate  situation which I feel will continue with pattern of return each time he runs out of supplies. I have referred him to the [facility] program several times and I have reached out to the [employer] today but I have not heard back from them about indigent supplies for this patient.   Discussed POC with patient and bedside nurse.  Re consult if needed, will not follow at this time. Thanks  Zohra Clavel M.D.[employer]. [employer] MSN, RN,CNS, CWOCN [phone])

## 2016-01-30 NOTE — Discharge Planning (Signed)
EDCM contacted to assist pt with obtaining ostomy supplies. Pt provided supplies by [facility] nurse; [facility] and follow up with Dr Lindie SpruceWyatt office for tomorrow visit.

## 2016-01-30 NOTE — ED Notes (Signed)
WOC nurse paged about requested materials arriving to ED.

## 2016-01-30 NOTE — ED Notes (Signed)
Per EMS, patient here with burning/pain from his colostomy site.   Patient states has been going on x 1 week.   Patient states it mostly burns when there is output.   Patient states "it's sore".  EMS advised VSS, no other problems.   Patient states he also has sciatic pain that is chronic, but giving him trouble.

## 2016-01-30 NOTE — Discharge Instructions (Signed)
It was our pleasure to provide your ER care today - we hope that you feel better.  Follow up with your doctors as planned this Monday.  Keep site and skin around site very clean/dry. Change ostomy bag as need.  Return to ER if worse, new symptoms, fevers, persistent vomiting, other concern.  You were given pain medication in the ER - no driving for the next 4 hours.     Abdominal Pain, Adult Many things can cause abdominal pain. Usually, abdominal pain is not caused by a disease and will improve without treatment. It can often be observed and treated at home. Your health care provider will do a physical exam and possibly order blood tests and X-rays to help determine the seriousness of your pain. However, in many cases, more time must pass before a clear cause of the pain can be found. Before that point, your health care provider may not know if you need more testing or further treatment. HOME CARE INSTRUCTIONS Monitor your abdominal pain for any changes. The following actions may help to alleviate any discomfort you are experiencing:  Only take over-the-counter or prescription medicines as directed by your health care provider.  Do not take laxatives unless directed to do so by your health care provider.  Try a clear liquid diet (broth, tea, or water) as directed by your health care provider. Slowly move to a bland diet as tolerated. SEEK MEDICAL CARE IF:  You have unexplained abdominal pain.  You have abdominal pain associated with nausea or diarrhea.  You have pain when you urinate or have a bowel movement.  You experience abdominal pain that wakes you in the night.  You have abdominal pain that is worsened or improved by eating food.  You have abdominal pain that is worsened with eating fatty foods.  You have a fever. SEEK IMMEDIATE MEDICAL CARE IF:  Your pain does not go away within 2 hours.  You keep throwing up (vomiting).  Your pain is felt only in portions of the  abdomen, such as the right side or the left lower portion of the abdomen.  You pass bloody or black tarry stools. MAKE SURE YOU:  Understand these instructions.  Will watch your condition.  Will get help right away if you are not doing well or get worse.   This information is not intended to replace advice given to you by your health care provider. Make sure you discuss any questions you have with your health care provider.   Document Released: 04/17/2005 Document Revised: 03/29/2015 Document Reviewed: 03/17/2013 Elsevier Interactive Patient Education [employer]2016 Elsevier Inc.

## 2016-01-30 NOTE — ED Notes (Signed)
Wound care RN at bedside to change ostomy supplies.

## 2016-01-30 NOTE — Progress Notes (Signed)
Spoke to patient regarding primary care resources and the [employer] orange card. Orange card application provided and explained. Patient instructed to contact me once application is complete for an eligibility appointment to obtain the orange card. My contact information provided for any future questions or concerns. No other Community Health & Eligibility Specialist needs identified at this time. ° ° °[name] °Community Health & Eligibility Specialist °[id number] °[phone] ° °

## 2016-01-31 ENCOUNTER — Encounter: Payer: Self-pay | Admitting: Internal Medicine

## 2016-01-31 ENCOUNTER — Ambulatory Visit: Payer: Self-pay | Attending: Internal Medicine | Admitting: Internal Medicine

## 2016-01-31 VITALS — BP 113/73 | HR 102 | Temp 98.3°F | Resp 18 | Ht 68.0 in | Wt 178.0 lb

## 2016-01-31 DIAGNOSIS — G8918 Other acute postprocedural pain: Secondary | ICD-10-CM

## 2016-01-31 DIAGNOSIS — S7400XA Injury of sciatic nerve at hip and thigh level, unspecified leg, initial encounter: Secondary | ICD-10-CM | POA: Insufficient documentation

## 2016-01-31 DIAGNOSIS — M792 Neuralgia and neuritis, unspecified: Secondary | ICD-10-CM | POA: Insufficient documentation

## 2016-01-31 DIAGNOSIS — Z932 Ileostomy status: Secondary | ICD-10-CM

## 2016-01-31 DIAGNOSIS — M21371 Foot drop, right foot: Secondary | ICD-10-CM

## 2016-01-31 DIAGNOSIS — S7401XS Injury of sciatic nerve at hip and thigh level, right leg, sequela: Secondary | ICD-10-CM

## 2016-01-31 MED ORDER — AMITRIPTYLINE HCL 25 MG PO TABS: 25.0000 mg | ORAL_TABLET | Freq: Every day | ORAL | Status: DC

## 2016-01-31 MED ORDER — LIDOCAINE 5 % EX OINT: 1.0000 "application " | TOPICAL_OINTMENT | CUTANEOUS | Status: DC | PRN

## 2016-01-31 MED FILL — ?AMITRIPTYLINE HCL 25 MG TA: 25 | 30 days supply | Qty: 30 | Fill #0

## 2016-01-31 MED FILL — LIDOCAINE 5% OINTMENT: 5 | 16 days supply | Qty: 30 | Fill #0
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
## 2016-01-31 NOTE — Assessment & Plan Note (Signed)
Ongoing chronic burning pain at ileostomy site Has previously been recommended for lidocaine topical to alleviate same - new erx done May also have some benefit with elavil (as rx'd for neuropathic foot pain) Note [facility] possible take down and reanastomosis... Encouraged to continue follow up there as ongoing care [facility]

## 2016-01-31 NOTE — Patient Instructions (Signed)
It was good to see you today.  We have reviewed your prior records including labs and tests today  Medications reviewed and updated Start lidocaine to numb pain at ileostomy site [facility] - then as per their directions Start amitriptlyine every night at bedtime for pain in right foot (AND may also help ileostomy pain) No other medication changes recommended at this time.  Your prescription(s) have been submitted to your pharmacy. Please take as directed and contact our office if you believe you are having problem(s) with the medication(s).  Please schedule followup in 2-3 months, call sooner if problems.

## 2016-01-31 NOTE — Progress Notes (Signed)
Patient is here for Ileostomy check
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
## 2016-02-01 NOTE — ED Notes (Signed)
Patient presents to [facility] lobby asking for ostomy supplies. He has not reached out to programs [facility], was [facility], [facility]. He has not been taking the immodium either. His medicaid has not been approved per the patient. Charge nurse Alaina responded to patient. He states that he just needs free supplies and is unable to pay for them. Attempting to contact case management and referring patient to ostomy clinic.

## 2016-02-03 ENCOUNTER — Encounter (HOSPITAL_COMMUNITY): Payer: Self-pay

## 2016-02-03 ENCOUNTER — Emergency Department (HOSPITAL_COMMUNITY)
Admission: EM | Admit: 2016-02-03 | Discharge: 2016-02-03 | Disposition: A | Discharge status: Home / Self Care | Payer: MEDICAID | Attending: Emergency Medicine | Admitting: Emergency Medicine

## 2016-02-03 DIAGNOSIS — Z79899 Other long term (current) drug therapy: Secondary | ICD-10-CM | POA: Insufficient documentation

## 2016-02-03 DIAGNOSIS — K9409 Other complications of colostomy: Secondary | ICD-10-CM | POA: Insufficient documentation

## 2016-02-03 DIAGNOSIS — Z433 Encounter for attention to colostomy: Secondary | ICD-10-CM

## 2016-02-03 DIAGNOSIS — Z7901 Long term (current) use of anticoagulants: Secondary | ICD-10-CM | POA: Insufficient documentation

## 2016-02-03 DIAGNOSIS — Z87891 Personal history of nicotine dependence: Secondary | ICD-10-CM | POA: Insufficient documentation
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
## 2016-02-03 NOTE — ED Notes (Addendum)
Pt's colostomy bag changed - pt requesting 2nd bag - advised pt unable to do so. Voiced understanding. Pt leaving w/his cell phone, ear phones and cane.

## 2016-02-03 NOTE — Discharge Instructions (Signed)
Follow-up with a primary care provider or the health department within 2 days to be seen regarding your colostomy care. Return to the emergency department if you experience issues with your colostomy bag, pain, skin breakdown, swelling, fever, chills.  Colostomy Home Guide A colostomy is an opening for stool to leave your body when a medical condition prevents it from leaving through the usual opening (rectum). During a surgery, a piece of large intestine (colon) is brought through a hole in the abdominal wall. The new opening is called a stoma or ostomy. A bag or pouch fits over the stoma to catch stool and gas. Your stool may be liquid, somewhat pasty, or formed. CARING FOR YOUR STOMA  Normally, the stoma looks a lot like the inside of your cheek: pink, red, and moist. At first it may be swollen, but this swelling will decrease within 6 weeks. Keep the skin around your stoma clean and dry. You can gently wash your stoma and the skin around your stoma in the shower with a clean, soft washcloth. If you develop any skin irritation, your caregiver may give you a stoma powder or ointment to help heal the area. Do not use any products other than those specifically given to you by your caregiver.  Your stoma should not be uncomfortable. If you notice any stinging or burning, your pouch may be leaking, and the skin around your stoma may be coming into contact with stool. This can cause skin irritation. If you notice stinging, replace your pouch with a new one and discard the old one. OSTOMY POUCHES  The pouch that fits over the ostomy can be made up of either 1 or 2 pieces. A one-piece pouch has a skin barrier piece and the pouch itself in one unit. A two-piece pouch has a skin barrier with a separate pouch that snaps on and off of the skin barrier. Either way, you should empty the pouch when it is only  to  full. Do not let more stool or gas build up. This could cause the pouch to leak. Some ostomy bags have a  built-in gas release valve. Ostomy deodorizer (5 drops) can be put into the pouch to prevent odor. Some people use ostomy lubricant drops inside the pouch to help the stool slide out of the bag more easily and completely.  EMPTYING YOUR OSTOMY POUCH  You may get lessons on how to empty your pouch from a wound-ostomy nurse before you leave the hospital. Here are the basic steps:  Wash your hands with soap and water.  Sit far back on the toilet.  Put several pieces of toilet paper into the toilet water. This will prevent splashing as you empty the stool into the toilet bowl.  Unclip or unvelcro the tail end of the pouch.  Unroll the tail and empty stool into the toilet.  Clean the tail with toilet paper.  Reroll the tail, and clip or velcro it closed.  Wash your hands again. CHANGING YOUR OSTOMY POUCH  Change your ostomy pouch about every 3 to 4 days for the first 6 weeks, then every 5 to7 days. Always change the bag sooner if there is any leakage or you begin to notice any discomfort or irritation of the skin around the stoma. When possible, plan to change your ostomy pouch before eating or drinking as this will lessen the chance of stool coming out during the pouch change. A wound-ostomy nurse may teach you how to change your pouch before you  leave the hospital. Here are the basic steps:  Lay out your supplies.  Wash your hands with soap and water.  Carefully remove the old pouch.  Wash the stoma and allow it to dry. Men may be advised to shave any hair around the stoma very carefully. This will make the adhesive stick better.  Use the stoma measuring guide that comes with your pouch set to decide what size hole you will need to cut in the skin barrier piece. Choose the smallest possible size that will hold the stoma but will not touch it.  Use the guide to trace the circle on the back of the skin barrier piece. Cut out the hole.  Hold the skin barrier piece over the stoma to make  sure the hole is the correct size.  Remove the adhesive paper backing from the skin barrier piece.  Squeeze stoma paste around the opening of the skin barrier piece.  Clean and dry the skin around the stoma again.  Carefully fit the skin barrier piece over your stoma.  If you are using a two-piece pouch, snap the pouch onto the skin barrier piece.  Close the tail of the pouch.  Put your hand over the top of the skin barrier piece to help warm it for about 5 minutes, so that it conforms to your body better.  Wash your hands again. DIET TIPS   Continue to follow your usual diet.  Drink about eight 8 oz glasses of water each day.  You can prevent gas by eating slowly and chewing your food thoroughly.  If you feel concerned that you have too much gas, you can cut back on gas-producing foods, such as:  Spicy foods.  Onions and garlic.  Cruciferous vegetables (cabbage, broccoli, cauliflower, Brussels sprouts).  Beans and legumes.  Some cheeses.  Eggs.  Fish.  Bubbly (carbonated) drinks.  Chewing gum. GENERAL TIPS   You can shower with or without the bag in place.  Always keep the bag on if you are bathing or swimming.  If your bag gets wet, you can dry it with a blow-dryer set to cool.  Avoid wearing tight clothing directly over your stoma so that it does not become irritated or bleed. Tight clothing can also prevent stool from draining into the pouch.  It is helpful to always have an extra skin barrier and pouch with you when traveling. Do not leave them anywhere too warm, as parts of them can melt.  Do not let your seat belt rest on your stoma. Try to keep the seat belt either above or below your stoma, or use a tiny pillow to cushion it.  You can still participate in sports, but you should avoid activities in which there is a risk of getting hit in the abdomen.  You can still have sex. It is a good idea to empty your pouch prior to sex. Some people and their  partners feel very comfortable seeing the pouch during sex. Others choose to wear lingerie or a T-shirt that covers the device. SEEK IMMEDIATE MEDICAL CARE IF:  You notice a change in the size or color of the stoma, especially if it becomes very red, purple, black, or pale white.  You have bloody stools or bleeding from the stoma.  You have abdominal pain, nausea, vomiting, or bloating.  There is anything unusual protruding from the stoma.  You have irritation or red skin around the stoma.  No stool is passing from the stoma.  You  have diarrhea (requiring more frequent than normal pouch emptying).   This information is not intended to replace advice given to you by your health care provider. Make sure you discuss any questions you have with your health care provider.   Document Released: 07/11/2003 Document Revised: 09/30/2011 Document Reviewed: 12/05/2010 Elsevier Interactive Patient Education [employer]2016 [employer] Inc.

## 2016-02-03 NOTE — ED Notes (Signed)
Patient hre requesting ostomy bag due to out of supplies. To have reversal surgery Monday. No other complaints

## 2016-02-03 NOTE — ED Notes (Signed)
Sitting on chair in exam room. Aware of delay w/obtaining colostomy bag.

## 2016-02-03 NOTE — ED Notes (Signed)
Material Management has sent 3 bags - none of them will work. Amy, NT, attempting to obtain.

## 2016-02-03 NOTE — ED Notes (Signed)
States is here for "a couple of colostomy bags to hold me until Monday". States is scheduled for [facility] on 02/05/[facility] d/t Dr Lindie SpruceWyatt unable to perform, per pt. Colostomy bag noted to be leaking from under dressing attached to bag. Light brown/yellow stool noted.

## 2016-02-03 NOTE — ED Notes (Addendum)
Materials Mgmt aware pt gave item [phone] for colostomy bag. Advised incorrect. Will attempt to send another bag.

## 2016-02-03 NOTE — ED Notes (Signed)
Paging Wound Care RN. Pt states uses special powder prior to putting on colostomy bag. Also skin around stoma - anterior to site noted to have redness - states is not new. Also noted w/open area to mid lower abd - dressing removed. Noted to be draining scant amount of bloody drainage. Pt states appears to be healing.

## 2016-02-03 NOTE — ED Notes (Signed)
Paged Wound Care RN x 2.

## 2016-02-04 ENCOUNTER — Encounter (HOSPITAL_COMMUNITY): Payer: Self-pay | Admitting: Emergency Medicine

## 2016-02-04 ENCOUNTER — Emergency Department (HOSPITAL_COMMUNITY)
Admission: EM | Admit: 2016-02-04 | Discharge: 2016-02-04 | Disposition: A | Discharge status: Home / Self Care | Payer: MEDICAID | Attending: Emergency Medicine | Admitting: Emergency Medicine

## 2016-02-04 DIAGNOSIS — Z79899 Other long term (current) drug therapy: Secondary | ICD-10-CM | POA: Insufficient documentation

## 2016-02-04 DIAGNOSIS — Z432 Encounter for attention to ileostomy: Secondary | ICD-10-CM | POA: Insufficient documentation

## 2016-02-04 DIAGNOSIS — Z87891 Personal history of nicotine dependence: Secondary | ICD-10-CM | POA: Insufficient documentation

## 2016-02-04 NOTE — ED Notes (Signed)
Pt requested additional bags. Pt given skin prep and told that he can use this with the bags he has at home and reminded patient of the resources that have been given to him by wound nurse so that he can obtain additional bags.

## 2016-02-04 NOTE — ED Notes (Signed)
States the new colostomy bags he got from [facility] recently have been leaking badly. No other problems at this time, requesting new colostomy bag/set up
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
## 2016-02-04 NOTE — ED Notes (Signed)
Ostomy bag changed. 
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
## 2016-02-05 ENCOUNTER — Emergency Department (HOSPITAL_COMMUNITY)
Admission: EM | Admit: 2016-02-05 | Discharge: 2016-02-05 | Disposition: A | Discharge status: Home / Self Care | Payer: MEDICAID | Attending: Emergency Medicine | Admitting: Emergency Medicine

## 2016-02-05 ENCOUNTER — Encounter (HOSPITAL_COMMUNITY): Payer: Self-pay | Admitting: Emergency Medicine

## 2016-02-05 DIAGNOSIS — Z87891 Personal history of nicotine dependence: Secondary | ICD-10-CM | POA: Insufficient documentation

## 2016-02-05 DIAGNOSIS — Z432 Encounter for attention to ileostomy: Secondary | ICD-10-CM

## 2016-02-05 DIAGNOSIS — Z7901 Long term (current) use of anticoagulants: Secondary | ICD-10-CM | POA: Insufficient documentation

## 2016-02-05 DIAGNOSIS — K9403 Colostomy malfunction: Secondary | ICD-10-CM | POA: Insufficient documentation

## 2016-02-05 NOTE — ED Notes (Addendum)
Pt to ED via [facility]>  C/o colostomy leaking since 6pm Sunday.  Also reports burning at ostomy site.  [facility] and [facility] for same.

## 2016-02-05 NOTE — ED Notes (Signed)
Per MD, applied ABD pads & tape to colostomy site as temporary fix for pt to make it to surgeon's today.

## 2016-02-05 NOTE — ED Notes (Signed)
Pt given set of scrub pants and assisted in departure by wheelchair by Rocky LinkKen, NT. Pt left in NAD.
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
## 2016-04-04 ENCOUNTER — Encounter (HOSPITAL_COMMUNITY): Payer: Self-pay | Admitting: *Deleted

## 2016-04-04 ENCOUNTER — Emergency Department (HOSPITAL_COMMUNITY)
Admission: EM | Admit: 2016-04-04 | Discharge: 2016-04-04 | Disposition: A | Discharge status: Home / Self Care | Payer: Self-pay | Attending: Emergency Medicine | Admitting: Emergency Medicine

## 2016-04-04 DIAGNOSIS — Z87891 Personal history of nicotine dependence: Secondary | ICD-10-CM | POA: Insufficient documentation

## 2016-04-04 DIAGNOSIS — Z7901 Long term (current) use of anticoagulants: Secondary | ICD-10-CM | POA: Insufficient documentation

## 2016-04-04 DIAGNOSIS — Z433 Encounter for attention to colostomy: Secondary | ICD-10-CM | POA: Insufficient documentation

## 2016-04-04 NOTE — ED Notes (Signed)
Assisting pt with emptying colostomy bag at this time.

## 2016-04-04 NOTE — ED Triage Notes (Signed)
Pt is here to have colostomy bag changed.
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
## 2016-04-29 ENCOUNTER — Encounter (HOSPITAL_COMMUNITY): Payer: Self-pay | Admitting: *Deleted

## 2016-04-29 ENCOUNTER — Emergency Department (HOSPITAL_COMMUNITY)
Admission: EM | Admit: 2016-04-29 | Discharge: 2016-04-29 | Disposition: A | Discharge status: Home / Self Care | Payer: Self-pay | Attending: Physician Assistant | Admitting: Physician Assistant

## 2016-04-29 DIAGNOSIS — Z87891 Personal history of nicotine dependence: Secondary | ICD-10-CM | POA: Insufficient documentation

## 2016-04-29 DIAGNOSIS — Z7901 Long term (current) use of anticoagulants: Secondary | ICD-10-CM | POA: Insufficient documentation

## 2016-04-29 DIAGNOSIS — Z433 Encounter for attention to colostomy: Secondary | ICD-10-CM

## 2016-04-29 DIAGNOSIS — K9403 Colostomy malfunction: Secondary | ICD-10-CM | POA: Insufficient documentation

## 2016-04-29 NOTE — Discharge Instructions (Signed)
Return to ER for new or worsening symptoms, any additional concerns.  °

## 2016-04-29 NOTE — Consult Note (Addendum)
WOC ostomy consult note Pt is familiar to WOC team from several previous visits for pouching difficulties. Pt in the ER, states his pouch is leaking, skin is raw, and he has no more supplies.  He is well-informed regarding pouch application and routines, but his Medicaid has not been approved and he has gone though all the pouches he had at home recently.  He has a very difficult pouching situation related to a stoma which is flush with skin level and located near his groin crease on the RLQ.  It has large amts of liquid green drainage which has caused itching and burning to the peristomal skin. His girfried buys pouches with out of pocket money, since he does not have insurance, and he states they cannot get any more pouches until she gets paid later this month. Stoma type/location: Ileostomy stoma red and viable, flush with skin level, 1 inch. Peristomal assessment:  Red and macerated to 1 cm area surrounding stoma, painful to touch.  Treatment options for stomal/peristomal skin:  Pt is aware of using crusting technique for skin maceration but did not have any more supplies of pouches, powder, or skin prep.   Output: Large amt brown liquid stool Ostomy pouching: 1pc.  Education provided: Applied one piece flexible convex pouch with a belt. Discussed troubleshooting strategies and gave pt 5 free samples of flexible convex pouches, which are not available in the [facility] formulary.  Pt has has [facility] and they plan for reversal surgery in December, he states.  Denies further questions at this time. Please re-consult if further assistance is needed.  Thank-you,  Cammie Mcgeeawn [place] Fant MSN, RN, CWOCN, [place]CN-AP, CNS [phone]

## 2016-04-29 NOTE — ED Notes (Signed)
Declined W/C at D/C and was escorted to lobby by RN. 

## 2016-04-29 NOTE — ED Notes (Signed)
Wound/Ostomy nurse at bedside.  

## 2016-04-29 NOTE — ED Triage Notes (Signed)
Pt states he needs his colostomy bag changed.  States he always comes here to get his colostomy bag changed.  States he does not go to a wound care clinic.

## 2016-04-29 NOTE — ED Triage Notes (Signed)
Ostomy RN assisted PT with care and supplies.
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
## 2016-04-29 NOTE — ED Triage Notes (Signed)
Wound / ostomy nurse will come see PT.

## 2016-05-15 ENCOUNTER — Encounter: Payer: Self-pay | Admitting: Family Medicine

## 2016-05-15 ENCOUNTER — Ambulatory Visit: Payer: Self-pay | Attending: Family Medicine | Admitting: Family Medicine

## 2016-05-15 VITALS — BP 101/65 | HR 80 | Temp 98.1°F | Ht 68.0 in | Wt 186.6 lb

## 2016-05-15 DIAGNOSIS — M792 Neuralgia and neuritis, unspecified: Secondary | ICD-10-CM

## 2016-05-15 DIAGNOSIS — M79671 Pain in right foot: Secondary | ICD-10-CM | POA: Insufficient documentation

## 2016-05-15 DIAGNOSIS — Z9889 Other specified postprocedural states: Secondary | ICD-10-CM | POA: Insufficient documentation

## 2016-05-15 DIAGNOSIS — Z87828 Personal history of other (healed) physical injury and trauma: Secondary | ICD-10-CM | POA: Insufficient documentation

## 2016-05-15 DIAGNOSIS — M21371 Foot drop, right foot: Secondary | ICD-10-CM | POA: Insufficient documentation

## 2016-05-15 DIAGNOSIS — Z7901 Long term (current) use of anticoagulants: Secondary | ICD-10-CM | POA: Insufficient documentation

## 2016-05-15 DIAGNOSIS — Z86718 Personal history of other venous thrombosis and embolism: Secondary | ICD-10-CM | POA: Insufficient documentation

## 2016-05-15 DIAGNOSIS — G629 Polyneuropathy, unspecified: Secondary | ICD-10-CM | POA: Insufficient documentation

## 2016-05-15 DIAGNOSIS — Z933 Colostomy status: Secondary | ICD-10-CM | POA: Insufficient documentation

## 2016-05-15 DIAGNOSIS — Z932 Ileostomy status: Secondary | ICD-10-CM

## 2016-05-15 MED ORDER — AMITRIPTYLINE HCL 25 MG PO TABS: 25.0000 mg | ORAL_TABLET | Freq: Every day | ORAL | 3 refills | Status: DC

## 2016-05-15 MED ORDER — MELOXICAM 7.5 MG PO TABS: 7.5000 mg | ORAL_TABLET | Freq: Every day | ORAL | 2 refills | Status: DC

## 2016-05-15 MED FILL — ?MELOXICAM 7.5 MG TABLET: 7.5 | 30 days supply | Qty: 30 | Fill #0

## 2016-05-15 MED FILL — AMITRIPTYLINE HCL 25 MG TAB: 25 | 30 days supply | Qty: 30 | Fill #0
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
## 2016-05-24 MED FILL — MELOXICAM 7.5 MG TABLET: 7.5 | 30 days supply | Qty: 30 | Fill #1

## 2016-06-01 ENCOUNTER — Emergency Department (HOSPITAL_COMMUNITY): Payer: Self-pay

## 2016-06-01 ENCOUNTER — Encounter (HOSPITAL_COMMUNITY): Payer: Self-pay | Admitting: Radiology

## 2016-06-01 ENCOUNTER — Emergency Department (HOSPITAL_COMMUNITY)
Admission: EM | Admit: 2016-06-01 | Discharge: 2016-06-01 | Disposition: A | Discharge status: Home / Self Care | Payer: Self-pay | Attending: Emergency Medicine | Admitting: Emergency Medicine

## 2016-06-01 DIAGNOSIS — Z7189 Other specified counseling: Secondary | ICD-10-CM

## 2016-06-01 DIAGNOSIS — Z87891 Personal history of nicotine dependence: Secondary | ICD-10-CM | POA: Insufficient documentation

## 2016-06-01 DIAGNOSIS — Z432 Encounter for attention to ileostomy: Secondary | ICD-10-CM | POA: Insufficient documentation

## 2016-06-01 DIAGNOSIS — Z79899 Other long term (current) drug therapy: Secondary | ICD-10-CM | POA: Insufficient documentation

## 2016-06-01 DIAGNOSIS — R109 Unspecified abdominal pain: Secondary | ICD-10-CM

## 2016-06-01 DIAGNOSIS — R1031 Right lower quadrant pain: Secondary | ICD-10-CM | POA: Insufficient documentation

## 2016-06-01 LAB — CBC WITH DIFFERENTIAL/PLATELET
Basophils Absolute: 0.1 10*3/uL (ref 0.0–0.1)
Basophils Relative: 1 %
Eosinophils Absolute: 0.6 10*3/uL (ref 0.0–0.7)
Eosinophils Relative: 4 %
HCT: 35.6 % — ABNORMAL LOW (ref 39.0–52.0)
Hemoglobin: 12.1 g/dL — ABNORMAL LOW (ref 13.0–17.0)
Lymphocytes Relative: 23 %
Lymphs Abs: 3.3 10*3/uL (ref 0.7–4.0)
MCH: 31.9 pg (ref 26.0–34.0)
MCHC: 34 g/dL (ref 30.0–36.0)
MCV: 93.9 fL (ref 78.0–100.0)
Monocytes Absolute: 1.1 10*3/uL — ABNORMAL HIGH (ref 0.1–1.0)
Monocytes Relative: 8 %
Neutro Abs: 9.1 10*3/uL — ABNORMAL HIGH (ref 1.7–7.7)
Neutrophils Relative %: 64 %
Platelets: 307 10*3/uL (ref 150–400)
RBC: 3.79 MIL/uL — ABNORMAL LOW (ref 4.22–5.81)
RDW: 15 % (ref 11.5–15.5)
WBC Morphology: INCREASED
WBC: 14.2 10*3/uL — ABNORMAL HIGH (ref 4.0–10.5)

## 2016-06-01 LAB — COMPREHENSIVE METABOLIC PANEL
ALT: 24 U/L (ref 17–63)
AST: 29 U/L (ref 15–41)
Albumin: 3.9 g/dL (ref 3.5–5.0)
Alkaline Phosphatase: 86 U/L (ref 38–126)
Anion gap: 7 (ref 5–15)
BUN: 20 mg/dL (ref 6–20)
CO2: 20 mmol/L — ABNORMAL LOW (ref 22–32)
Calcium: 8.7 mg/dL — ABNORMAL LOW (ref 8.9–10.3)
Chloride: 115 mmol/L — ABNORMAL HIGH (ref 101–111)
Creatinine, Ser: 1.23 mg/dL (ref 0.61–1.24)
GFR calc Af Amer: >60 mL/min (ref >60)
GFR calc non Af Amer: >60 mL/min (ref >60)
Glucose, Bld: 91 mg/dL (ref 65–99)
Potassium: 4.1 mmol/L (ref 3.5–5.1)
Sodium: 142 mmol/L (ref 135–145)
Total Bilirubin: 0.3 mg/dL (ref 0.3–1.2)
Total Protein: 7.9 g/dL (ref 6.5–8.1)

## 2016-06-01 LAB — I-STAT CG4 LACTIC ACID, ED: Lactic Acid, Venous: 1.22 mmol/L (ref 0.5–1.9)

## 2016-06-01 LAB — LIPASE, BLOOD: Lipase: 27 U/L (ref 11–51)

## 2016-06-01 MED ORDER — MORPHINE SULFATE (PF) 4 MG/ML IV SOLN
4.0000 mg | Freq: Once | INTRAVENOUS | Status: AC
  Administered 2016-06-01: 4 mg via INTRAVENOUS
  Filled 2016-06-01: qty 1

## 2016-06-01 MED ORDER — IOPAMIDOL (ISOVUE-300) INJECTION 61%
100.0000 mL | Freq: Once | INTRAVENOUS | Status: AC | PRN
  Administered 2016-06-01: 100 mL via INTRAVENOUS
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
## 2016-06-01 NOTE — ED Notes (Signed)
Bed: WA13 Expected date:  Expected time:  Means of arrival:  Comments: Ems  

## 2016-06-01 NOTE — ED Notes (Signed)
I have spent much time with him. I procured all necessary supplies and have changed his ostomy bag to the type he prefers, for which he thanks me. Dr. Fayrene FearingJames has also just spoken with pt. And pt. Is happy to find his CT was ok.

## 2016-06-01 NOTE — ED Notes (Signed)
Right side colostomy site cleaned and old bag removed. Redness and irritation noted to the left side of bag. Stoma site beefy red.

## 2016-06-01 NOTE — ED Notes (Signed)
Pt states that his colostomy supplies are being ordered. Pt does not currently have any supplies at home.

## 2016-06-01 NOTE — ED Notes (Signed)
Pt having complaints of leaking colostomy bag that is painful around the site.
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
## 2016-06-01 NOTE — ED Notes (Signed)
Colostomy site cleaned and new bag ([place] [id number]) placed. Skin around site red and tender to touch. Oozing site to the left of colostomy bag cleaned and gauze dressing placed.

## 2016-06-01 NOTE — ED Notes (Signed)
Patient transported to CT 

## 2016-06-03 ENCOUNTER — Emergency Department (HOSPITAL_COMMUNITY)
Admission: EM | Admit: 2016-06-03 | Discharge: 2016-06-03 | Disposition: A | Discharge status: Home / Self Care | Payer: Self-pay | Attending: Emergency Medicine | Admitting: Emergency Medicine

## 2016-06-03 ENCOUNTER — Encounter (HOSPITAL_COMMUNITY): Payer: Self-pay | Admitting: Emergency Medicine

## 2016-06-03 DIAGNOSIS — Z4801 Encounter for change or removal of surgical wound dressing: Secondary | ICD-10-CM | POA: Insufficient documentation

## 2016-06-03 DIAGNOSIS — Z87891 Personal history of nicotine dependence: Secondary | ICD-10-CM | POA: Insufficient documentation

## 2016-06-03 DIAGNOSIS — Z5189 Encounter for other specified aftercare: Secondary | ICD-10-CM

## 2016-06-03 NOTE — ED Notes (Signed)
Gave pt a urinal to empty colostomy bag.
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
## 2016-06-03 NOTE — Discharge Planning (Signed)
EDCM consulted to assist pt with obtaining ostomy supplies.  Pt active with [facility] and was advised to make appointment with Financial Counselor to complete [employer] [employer] Application and [employer] Application to help pay for ostomy supplies and other medical needs.  Pt states his intentions were to get that done today.  Pt will not qualify for Home Health as he is not homebound.

## 2016-06-03 NOTE — ED Triage Notes (Signed)
Pt sts leaking colostomy bag and requests wound eval for stoma

## 2016-06-03 NOTE — Discharge Instructions (Signed)
Do not hesitate to return to the emergency room for any new, worsening or concerning symptoms. ° °Please obtain primary care using resource guide below. Let them know that you were seen in the emergency room and that they will need to obtain records for further outpatient management. ° ° °

## 2016-06-03 NOTE — ED Notes (Signed)
Pt has received clean urinal and wash cloths to use for ostomy care. Pt requesting to know the time that the Ostomy nurse will be here. RN informed pt that currently there is not ETA. But will check periodically. Pt understands.

## 2016-06-03 NOTE — ED Notes (Signed)
Per Ostomy RN at bedside. Pt received proper equipment and girlfriend of patient has been called. Pt is OK for discharge.

## 2016-06-03 NOTE — Consult Note (Addendum)
WOC Nurse ostomy consult note Patient well known to this ostomy nurse from his previous admission and ED visits. Arrived at patient's room to discuss ostomy supplies, patient reports skin irritation. When I asked patient about his supplies he reports he is out of supplies.  This is traditionally when he comes into the ED with complaints of skin irritation.  Patient has unfortunate situation, he is not eligible for MCD or either he has not qualified at this time.  He reports [name] but they will not see him again until Dec. 6th.  [name] per his report to have this surgery.  He reports he ordered 5 pouches from [employer] and when I called them just now they report they do not provide ostomy supplies.  Called [name] as well, they do not have Raymond Bowen as a customer there either.  I have reported to Raymond Bowen that none of the supply stores know anything about this mystery order, I am not sure that the patient is telling the truth about this.  This WOC nurse has assisted this patient multiple times with supplies and has provided indigent information for [name], however to this date patient still has not communicated or tried to enroll in this program. He reports he has some free samples form [employer] at home but they are bags and not the wafer to attach the bag to.  I have provided the patient with 4 pouches and 4 wafers today and I used one to change the pouch he reports is currently leaking but it is difficult to see that it has.  I have offered to see if I have wafers to match his [employer] pouches at home, I provided my office contact number to have him call me back and let me know the item # of the pouches he has at home in order to see if I have wafers that will work for these pouches. As far as his report of skin irritation, today his peristomal skin looks the best it has ever. However the minor skin irritation is  just distal to the stoma which is most likely because he is cutting the aperture of the wafer too large, I have explained this to the patient and demonstrated how to cut the wafer with the next pouch change/ Stoma type/location: RLQ, 1" oval shaped Stomal assessment/size: the stoma is not flush with the skin not retracted as it has been in the past, this will improved the ability to care for his stoma.  Peristomal assessment: mild denudation from 3-9 oclock just under the stoma from the wafer cut too large Treatment options for stomal/peristomal skin: used skin barrier wipe to prep the skin  Output liquid green with chunks Ostomy pouching: 2pc.  Education provided: drew examples of how to cut wafer, patient had already cut wafer today for use prior to me seeing the stoma.     Thanks  Raymond Bowen [employer] MSN, RN,CWOCN, CNS [phone])

## 2016-06-03 NOTE — ED Notes (Signed)
Ostomy RN at bedside

## 2016-08-06 ENCOUNTER — Ambulatory Visit: Payer: Self-pay | Admitting: Family Medicine

## 2016-08-27 ENCOUNTER — Ambulatory Visit: Payer: Self-pay | Attending: Family Medicine | Admitting: Family Medicine

## 2016-08-27 ENCOUNTER — Encounter: Payer: Self-pay | Admitting: Family Medicine

## 2016-08-27 VITALS — BP 127/72 | HR 78 | Temp 98.2°F | Ht 68.0 in | Wt 194.8 lb

## 2016-08-27 DIAGNOSIS — Z930 Tracheostomy status: Secondary | ICD-10-CM | POA: Insufficient documentation

## 2016-08-27 DIAGNOSIS — Z933 Colostomy status: Secondary | ICD-10-CM | POA: Insufficient documentation

## 2016-08-27 DIAGNOSIS — Z86718 Personal history of other venous thrombosis and embolism: Secondary | ICD-10-CM | POA: Insufficient documentation

## 2016-08-27 DIAGNOSIS — M792 Neuralgia and neuritis, unspecified: Secondary | ICD-10-CM | POA: Insufficient documentation

## 2016-08-27 DIAGNOSIS — Z932 Ileostomy status: Secondary | ICD-10-CM

## 2016-08-27 DIAGNOSIS — M21371 Foot drop, right foot: Secondary | ICD-10-CM | POA: Insufficient documentation

## 2016-08-27 DIAGNOSIS — Z9889 Other specified postprocedural states: Secondary | ICD-10-CM | POA: Insufficient documentation

## 2016-08-27 DIAGNOSIS — Z91013 Allergy to seafood: Secondary | ICD-10-CM | POA: Insufficient documentation

## 2016-08-27 DIAGNOSIS — Z87828 Personal history of other (healed) physical injury and trauma: Secondary | ICD-10-CM | POA: Insufficient documentation

## 2016-08-27 MED ORDER — GABAPENTIN 600 MG PO TABS: 600.0000 mg | ORAL_TABLET | Freq: Three times a day (TID) | ORAL | 3 refills | Status: DC

## 2016-08-27 MED ORDER — MELOXICAM 7.5 MG PO TABS: 7.5000 mg | ORAL_TABLET | Freq: Every day | ORAL | 3 refills | Status: DC
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
## 2016-09-05 ENCOUNTER — Ambulatory Visit: Payer: Self-pay

## 2016-09-11 ENCOUNTER — Emergency Department (HOSPITAL_BASED_OUTPATIENT_CLINIC_OR_DEPARTMENT_OTHER): Admit: 2016-09-11 | Discharge: 2016-09-11 | Disposition: A | Discharge status: Home / Self Care | Payer: Self-pay

## 2016-09-11 ENCOUNTER — Emergency Department (HOSPITAL_COMMUNITY)
Admission: EM | Admit: 2016-09-11 | Discharge: 2016-09-11 | Disposition: A | Discharge status: Home / Self Care | Payer: Self-pay | Attending: Emergency Medicine | Admitting: Emergency Medicine

## 2016-09-11 DIAGNOSIS — Z79899 Other long term (current) drug therapy: Secondary | ICD-10-CM | POA: Insufficient documentation

## 2016-09-11 DIAGNOSIS — Z87891 Personal history of nicotine dependence: Secondary | ICD-10-CM | POA: Insufficient documentation

## 2016-09-11 DIAGNOSIS — M7989 Other specified soft tissue disorders: Secondary | ICD-10-CM | POA: Insufficient documentation

## 2016-09-11 DIAGNOSIS — M79609 Pain in unspecified limb: Secondary | ICD-10-CM

## 2016-09-11 LAB — BASIC METABOLIC PANEL
Anion gap: 8 (ref 5–15)
BUN: 18 mg/dL (ref 6–20)
CO2: 20 mmol/L — ABNORMAL LOW (ref 22–32)
Calcium: 9.6 mg/dL (ref 8.9–10.3)
Chloride: 109 mmol/L (ref 101–111)
Creatinine, Ser: 1.49 mg/dL — ABNORMAL HIGH (ref 0.61–1.24)
GFR calc Af Amer: >60 mL/min (ref >60)
GFR calc non Af Amer: 58 mL/min — ABNORMAL LOW (ref >60)
Glucose, Bld: 87 mg/dL (ref 65–99)
Potassium: 4.4 mmol/L (ref 3.5–5.1)
Sodium: 137 mmol/L (ref 135–145)

## 2016-09-11 LAB — CBC WITH DIFFERENTIAL/PLATELET
Basophils Absolute: 0 10*3/uL (ref 0.0–0.1)
Basophils Relative: 0 %
Eosinophils Absolute: 0.2 10*3/uL (ref 0.0–0.7)
Eosinophils Relative: 2 %
HCT: 38.8 % — ABNORMAL LOW (ref 39.0–52.0)
Hemoglobin: 12.8 g/dL — ABNORMAL LOW (ref 13.0–17.0)
Lymphocytes Relative: 16 %
Lymphs Abs: 2.1 10*3/uL (ref 0.7–4.0)
MCH: 31.2 pg (ref 26.0–34.0)
MCHC: 33 g/dL (ref 30.0–36.0)
MCV: 94.6 fL (ref 78.0–100.0)
Monocytes Absolute: 1.2 10*3/uL — ABNORMAL HIGH (ref 0.1–1.0)
Monocytes Relative: 9 %
Neutro Abs: 9.9 10*3/uL — ABNORMAL HIGH (ref 1.7–7.7)
Neutrophils Relative %: 73 %
Platelets: 235 10*3/uL (ref 150–400)
RBC: 4.1 MIL/uL — ABNORMAL LOW (ref 4.22–5.81)
RDW: 14.1 % (ref 11.5–15.5)
WBC: 13.4 10*3/uL — ABNORMAL HIGH (ref 4.0–10.5)

## 2016-09-11 MED ORDER — ONDANSETRON HCL 4 MG/2ML IJ SOLN
4.0000 mg | Freq: Once | INTRAMUSCULAR | Status: AC
Start: 2016-09-11 — End: 2016-09-11
  Administered 2016-09-11: 4 mg via INTRAVENOUS
  Filled 2016-09-11: qty 2

## 2016-09-11 MED ORDER — RIVAROXABAN (XARELTO) VTE STARTER PACK (15 & 20 MG): ORAL_TABLET | ORAL | 0 refills | Status: DC

## 2016-09-11 MED ORDER — MORPHINE SULFATE (PF) 4 MG/ML IV SOLN
4.0000 mg | Freq: Once | INTRAVENOUS | Status: AC
  Administered 2016-09-11: 4 mg via INTRAVENOUS
  Filled 2016-09-11: qty 1

## 2016-09-11 MED ORDER — OXYCODONE-ACETAMINOPHEN 5-325 MG PO TABS: 1.0000 | ORAL_TABLET | Freq: Four times a day (QID) | ORAL | 0 refills | Status: DC | PRN

## 2016-09-11 NOTE — Progress Notes (Signed)
*  PRELIMINARY RESULTS* Vascular Ultrasound Right lower extremity venous duplex has been completed.  Preliminary findings: no evidence of acute DVT. 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    Farrel DemarkJill Eunice, RDMS, RVT  09/11/2016, 2:23 PM

## 2016-09-11 NOTE — ED Notes (Signed)
Pt ambulated to room from waiting room, tolerated well. Pt now undressing.

## 2016-09-11 NOTE — Discharge Instructions (Signed)
You need to have a repeat ultrasound in 1 week.  Please call your primary care doctor to get this set up.  You need to take Xarelto as directed on the prescription.

## 2016-09-11 NOTE — ED Notes (Signed)
Attempted twice for IV placement IVT consult placed due to unsuccessful IV placement
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
## 2016-09-11 NOTE — ED Notes (Signed)
Patient transported to Ultrasound 
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
## 2016-09-16 ENCOUNTER — Inpatient Hospital Stay: Payer: Self-pay | Admitting: Family Medicine

## 2016-10-22 ENCOUNTER — Emergency Department (HOSPITAL_COMMUNITY)
Admission: EM | Admit: 2016-10-22 | Discharge: 2016-10-22 | Disposition: A | Discharge status: Home / Self Care | Payer: PRIVATE HEALTH INSURANCE | Attending: Emergency Medicine | Admitting: Emergency Medicine

## 2016-10-22 ENCOUNTER — Emergency Department (HOSPITAL_BASED_OUTPATIENT_CLINIC_OR_DEPARTMENT_OTHER)
Admit: 2016-10-22 | Discharge: 2016-10-22 | Disposition: A | Discharge status: Home / Self Care | Payer: PRIVATE HEALTH INSURANCE | Attending: Emergency Medicine | Admitting: Emergency Medicine

## 2016-10-22 ENCOUNTER — Emergency Department (HOSPITAL_COMMUNITY): Payer: PRIVATE HEALTH INSURANCE

## 2016-10-22 ENCOUNTER — Encounter (HOSPITAL_COMMUNITY): Payer: Self-pay

## 2016-10-22 DIAGNOSIS — Z87891 Personal history of nicotine dependence: Secondary | ICD-10-CM | POA: Insufficient documentation

## 2016-10-22 DIAGNOSIS — Z79899 Other long term (current) drug therapy: Secondary | ICD-10-CM | POA: Diagnosis not present

## 2016-10-22 DIAGNOSIS — Z7901 Long term (current) use of anticoagulants: Secondary | ICD-10-CM | POA: Diagnosis not present

## 2016-10-22 DIAGNOSIS — M7989 Other specified soft tissue disorders: Secondary | ICD-10-CM

## 2016-10-22 DIAGNOSIS — M79671 Pain in right foot: Secondary | ICD-10-CM | POA: Diagnosis present

## 2016-10-22 DIAGNOSIS — M79609 Pain in unspecified limb: Secondary | ICD-10-CM

## 2016-10-22 DIAGNOSIS — M25474 Effusion, right foot: Secondary | ICD-10-CM

## 2016-10-22 MED ORDER — HYDROCODONE-ACETAMINOPHEN 5-325 MG PO TABS
2.0000 | ORAL_TABLET | Freq: Once | ORAL | Status: AC
  Administered 2016-10-22: 2 via ORAL
  Filled 2016-10-22: qty 2

## 2016-10-22 MED ORDER — OXYCODONE-ACETAMINOPHEN 5-325 MG PO TABS: 1.0000 | ORAL_TABLET | Freq: Four times a day (QID) | ORAL | 0 refills | Status: DC | PRN

## 2016-10-22 NOTE — ED Triage Notes (Signed)
Pt c/o of sudden on set foot pain on the right Sunday morning.

## 2016-10-22 NOTE — Progress Notes (Addendum)
Preliminary results by tech - Right Lower Ext. Venous Completed. Chronic appearing thrombus is still present in the femoral vein, popliteal vein and calf veins with some improvement since last study done on 09/11/16.  Marilynne Halsted, BS, RDMS, RVT

## 2016-10-22 NOTE — ED Notes (Signed)
Patient transported to vascular. 
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
## 2016-10-23 ENCOUNTER — Inpatient Hospital Stay (HOSPITAL_COMMUNITY)
Admission: EM | Admit: 2016-10-23 | Discharge: 2016-10-25 | DRG: 580 | Disposition: A | Discharge status: Home / Self Care | Payer: Medicaid Other | Attending: Internal Medicine | Admitting: Internal Medicine

## 2016-10-23 ENCOUNTER — Encounter (HOSPITAL_COMMUNITY): Payer: Self-pay | Admitting: *Deleted

## 2016-10-23 DIAGNOSIS — L089 Local infection of the skin and subcutaneous tissue, unspecified: Secondary | ICD-10-CM

## 2016-10-23 DIAGNOSIS — L02611 Cutaneous abscess of right foot: Secondary | ICD-10-CM | POA: Diagnosis present

## 2016-10-23 DIAGNOSIS — I82501 Chronic embolism and thrombosis of unspecified deep veins of right lower extremity: Secondary | ICD-10-CM | POA: Diagnosis present

## 2016-10-23 DIAGNOSIS — Z87891 Personal history of nicotine dependence: Secondary | ICD-10-CM

## 2016-10-23 DIAGNOSIS — M21371 Foot drop, right foot: Secondary | ICD-10-CM | POA: Diagnosis present

## 2016-10-23 DIAGNOSIS — Z881 Allergy status to other antibiotic agents status: Secondary | ICD-10-CM

## 2016-10-23 DIAGNOSIS — Z79899 Other long term (current) drug therapy: Secondary | ICD-10-CM

## 2016-10-23 DIAGNOSIS — F4321 Adjustment disorder with depressed mood: Secondary | ICD-10-CM | POA: Diagnosis present

## 2016-10-23 DIAGNOSIS — Z8249 Family history of ischemic heart disease and other diseases of the circulatory system: Secondary | ICD-10-CM

## 2016-10-23 DIAGNOSIS — Z91013 Allergy to seafood: Secondary | ICD-10-CM | POA: Diagnosis not present

## 2016-10-23 DIAGNOSIS — G8921 Chronic pain due to trauma: Secondary | ICD-10-CM | POA: Diagnosis present

## 2016-10-23 DIAGNOSIS — N182 Chronic kidney disease, stage 2 (mild): Secondary | ICD-10-CM | POA: Diagnosis present

## 2016-10-23 DIAGNOSIS — W3400XS Accidental discharge from unspecified firearms or gun, sequela: Secondary | ICD-10-CM

## 2016-10-23 DIAGNOSIS — Z932 Ileostomy status: Secondary | ICD-10-CM | POA: Diagnosis not present

## 2016-10-23 DIAGNOSIS — L03115 Cellulitis of right lower limb: Secondary | ICD-10-CM | POA: Diagnosis present

## 2016-10-23 DIAGNOSIS — I82509 Chronic embolism and thrombosis of unspecified deep veins of unspecified lower extremity: Secondary | ICD-10-CM

## 2016-10-23 DIAGNOSIS — Z9889 Other specified postprocedural states: Secondary | ICD-10-CM

## 2016-10-23 DIAGNOSIS — L0390 Cellulitis, unspecified: Secondary | ICD-10-CM | POA: Insufficient documentation

## 2016-10-23 LAB — URINALYSIS, ROUTINE W REFLEX MICROSCOPIC
Bacteria, UA: NONE SEEN
Bilirubin Urine: NEGATIVE
Glucose, UA: NEGATIVE mg/dL
Ketones, ur: 5 mg/dL — AB
Leukocytes, UA: NEGATIVE
Nitrite: NEGATIVE
Protein, ur: 100 mg/dL — AB
Specific Gravity, Urine: 1.024 (ref 1.005–1.030)
pH: 6 (ref 5.0–8.0)

## 2016-10-23 LAB — CBC WITH DIFFERENTIAL/PLATELET
Basophils Absolute: 0 10*3/uL (ref 0.0–0.1)
Basophils Relative: 0 %
Eosinophils Absolute: 0.1 10*3/uL (ref 0.0–0.7)
Eosinophils Relative: 0 %
HCT: 39.7 % (ref 39.0–52.0)
Hemoglobin: 13.5 g/dL (ref 13.0–17.0)
Lymphocytes Relative: 17 %
Lymphs Abs: 2.8 10*3/uL (ref 0.7–4.0)
MCH: 30.8 pg (ref 26.0–34.0)
MCHC: 34 g/dL (ref 30.0–36.0)
MCV: 90.4 fL (ref 78.0–100.0)
Monocytes Absolute: 1.4 10*3/uL — ABNORMAL HIGH (ref 0.1–1.0)
Monocytes Relative: 9 %
Neutro Abs: 12.1 10*3/uL — ABNORMAL HIGH (ref 1.7–7.7)
Neutrophils Relative %: 74 %
Platelets: 316 10*3/uL (ref 150–400)
RBC: 4.39 MIL/uL (ref 4.22–5.81)
RDW: 14 % (ref 11.5–15.5)
WBC: 16.4 10*3/uL — ABNORMAL HIGH (ref 4.0–10.5)

## 2016-10-23 LAB — BASIC METABOLIC PANEL
Anion gap: 11 (ref 5–15)
BUN: 26 mg/dL — ABNORMAL HIGH (ref 6–20)
CO2: 16 mmol/L — ABNORMAL LOW (ref 22–32)
Calcium: 9.5 mg/dL (ref 8.9–10.3)
Chloride: 106 mmol/L (ref 101–111)
Creatinine, Ser: 1.6 mg/dL — ABNORMAL HIGH (ref 0.61–1.24)
GFR calc Af Amer: >60 mL/min (ref >60)
GFR calc non Af Amer: 53 mL/min — ABNORMAL LOW (ref >60)
Glucose, Bld: 84 mg/dL (ref 65–99)
Potassium: 4.7 mmol/L (ref 3.5–5.1)
Sodium: 133 mmol/L — ABNORMAL LOW (ref 135–145)

## 2016-10-23 LAB — I-STAT CG4 LACTIC ACID, ED: Lactic Acid, Venous: 0.99 mmol/L (ref 0.5–1.9)

## 2016-10-23 MED ORDER — ACETAMINOPHEN 650 MG RE SUPP: 650.0000 mg | Freq: Four times a day (QID) | RECTAL | Status: DC | PRN

## 2016-10-23 MED ORDER — PIPERACILLIN-TAZOBACTAM 3.375 G IVPB
3.3750 g | Freq: Three times a day (TID) | INTRAVENOUS | Status: DC
  Administered 2016-10-24 – 2016-10-25 (×4): 3.375 g via INTRAVENOUS
  Filled 2016-10-23 (×6): qty 50

## 2016-10-23 MED ORDER — DOXYCYCLINE HYCLATE 100 MG IV SOLR
100.0000 mg | Freq: Two times a day (BID) | INTRAVENOUS | Status: DC
  Administered 2016-10-24: 100 mg via INTRAVENOUS
  Filled 2016-10-23 (×2): qty 100

## 2016-10-23 MED ORDER — SODIUM CHLORIDE 0.9 % IV BOLUS (SEPSIS)
1000.0000 mL | Freq: Once | INTRAVENOUS | Status: AC
  Administered 2016-10-23: 1000 mL via INTRAVENOUS

## 2016-10-23 MED ORDER — PIPERACILLIN-TAZOBACTAM 3.375 G IVPB 30 MIN
3.3750 g | Freq: Once | INTRAVENOUS | Status: AC
  Administered 2016-10-23: 3.375 g via INTRAVENOUS

## 2016-10-23 MED ORDER — OXYCODONE-ACETAMINOPHEN 5-325 MG PO TABS
2.0000 | ORAL_TABLET | Freq: Once | ORAL | Status: AC
Start: 2016-10-23 — End: 2016-10-23
  Administered 2016-10-23: 2 via ORAL
  Filled 2016-10-23: qty 2

## 2016-10-23 MED ORDER — DIPHENHYDRAMINE HCL 50 MG/ML IJ SOLN
25.0000 mg | Freq: Once | INTRAMUSCULAR | Status: AC
  Administered 2016-10-23: 25 mg via INTRAVENOUS
  Filled 2016-10-23: qty 1

## 2016-10-23 MED ORDER — VANCOMYCIN HCL 10 G IV SOLR
1500.0000 mg | Freq: Once | INTRAVENOUS | Status: AC
  Administered 2016-10-23: 1500 mg via INTRAVENOUS
  Filled 2016-10-23: qty 1500

## 2016-10-23 MED ORDER — HYDROMORPHONE HCL 1 MG/ML IJ SOLN
1.0000 mg | Freq: Once | INTRAMUSCULAR | Status: AC
  Administered 2016-10-23: 1 mg via INTRAVENOUS
  Filled 2016-10-23: qty 1

## 2016-10-23 MED ORDER — ACETAMINOPHEN 325 MG PO TABS: 650.0000 mg | ORAL_TABLET | Freq: Four times a day (QID) | ORAL | Status: DC | PRN

## 2016-10-23 MED ORDER — VANCOMYCIN HCL IN DEXTROSE 1-5 GM/200ML-% IV SOLN: 1000.0000 mg | Freq: Two times a day (BID) | INTRAVENOUS | Status: DC

## 2016-10-23 NOTE — ED Notes (Signed)
Pt unable to sign due to multiple signature pad failure but verbally consents to going to [facility].

## 2016-10-23 NOTE — ED Triage Notes (Signed)
Pt complains of right foot pain and swelling since Sunday. Pt has foot brace that he has been unable to wear due to pain. Pt also complains of burning and itching around colostomy site.

## 2016-10-23 NOTE — ED Notes (Signed)
ED Provider at bedside. 
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
## 2016-10-23 NOTE — Progress Notes (Signed)
  Pharmacy Antibiotic Note  Raymond Bowen is a 41 y.o. male admitted on 10/23/2016 with toe wound infection.  Pharmacy has been consulted for Zosyn dosing.  Plan:  Vancomycin started in ED, but pt had reaction, so changing Zosyn/doxcycline  Zosyn 3.375 gr IV q8h EI.   Doxycycline 100 mg IV q12h ( MD)   Monitor clinical course, renal function, cultures as available  Daily SCr x 5 days     Temp (24hrs), Avg:98.2 F (36.8 C), Min:98.2 F (36.8 C), Max:98.2 F (36.8 C)   Recent Labs Lab 10/23/16 1848 10/23/16 1859  WBC 16.4*  --   CREATININE 1.60*  --   LATICACIDVEN  --  0.99    Estimated Creatinine Clearance: 68 mL/min (A) (by C-G formula based on SCr of 1.6 mg/dL (H)).    Allergies  Allergen Reactions  . Shellfish Allergy Anaphylaxis and Swelling    Crab legs, seafood   . Vancomycin Hives and Rash    Antimicrobials this admission: 4/4 vancomycin >>  4/4 Zosyn >> 4/4 doxycycline >>    Dose adjustments this admission:   Microbiology results:   Thank you for allowing pharmacy to be a part of this patient's care.  Adalberto Cole, PharmD, BCPS Pager [phone] 10/23/2016 10:10 PM

## 2016-10-23 NOTE — Progress Notes (Signed)
  Pharmacy Antibiotic Note  Raymond Bowen is a 41 y.o. male admitted on 10/23/2016 with toe wound infection.  Pharmacy has been consulted for vancomycin dosing.  Plan:  Vancomycin 1500 mg IV x1, then 1000 mg IV q12h   Monitor clinical course, renal function, cultures as available  Daily SCr x 5 days     Temp (24hrs), Avg:98.2 F (36.8 C), Min:98.2 F (36.8 C), Max:98.2 F (36.8 C)   Recent Labs Lab 10/23/16 1848 10/23/16 1859  WBC 16.4*  --   CREATININE 1.60*  --   LATICACIDVEN  --  0.99    Estimated Creatinine Clearance: 68 mL/min (A) (by C-G formula based on SCr of 1.6 mg/dL (H)).    Allergies  Allergen Reactions  . Shellfish Allergy Anaphylaxis and Swelling    Crab legs, seafood     Antimicrobials this admission: 4/4 vancomycin >>    Dose adjustments this admission:   Microbiology results:   Thank you for allowing pharmacy to be a part of this patient's care.  Adalberto Cole, PharmD, BCPS Pager [phone] 10/23/2016 7:30 PM
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
## 2016-10-24 ENCOUNTER — Encounter (HOSPITAL_COMMUNITY)
Admission: EM | Disposition: A | Discharge status: Home / Self Care | Payer: Self-pay | Source: Home / Self Care | Attending: Internal Medicine

## 2016-10-24 ENCOUNTER — Encounter (HOSPITAL_COMMUNITY): Payer: Self-pay | Admitting: Anesthesiology

## 2016-10-24 ENCOUNTER — Inpatient Hospital Stay (HOSPITAL_COMMUNITY): Payer: Medicaid Other | Admitting: Anesthesiology

## 2016-10-24 DIAGNOSIS — L03031 Cellulitis of right toe: Secondary | ICD-10-CM

## 2016-10-24 HISTORY — PX: I & D EXTREMITY: SHX5045

## 2016-10-24 LAB — CBC
HCT: 35.5 % — ABNORMAL LOW (ref 39.0–52.0)
Hemoglobin: 11.8 g/dL — ABNORMAL LOW (ref 13.0–17.0)
MCH: 31 pg (ref 26.0–34.0)
MCHC: 33.2 g/dL (ref 30.0–36.0)
MCV: 93.2 fL (ref 78.0–100.0)
Platelets: 311 10*3/uL (ref 150–400)
RBC: 3.81 MIL/uL — ABNORMAL LOW (ref 4.22–5.81)
RDW: 14.2 % (ref 11.5–15.5)
WBC: 13.1 10*3/uL — ABNORMAL HIGH (ref 4.0–10.5)

## 2016-10-24 LAB — BASIC METABOLIC PANEL
Anion gap: 8 (ref 5–15)
BUN: 23 mg/dL — ABNORMAL HIGH (ref 6–20)
CO2: 20 mmol/L — ABNORMAL LOW (ref 22–32)
Calcium: 8.9 mg/dL (ref 8.9–10.3)
Chloride: 105 mmol/L (ref 101–111)
Creatinine, Ser: 1.6 mg/dL — ABNORMAL HIGH (ref 0.61–1.24)
GFR calc Af Amer: >60 mL/min (ref >60)
GFR calc non Af Amer: 53 mL/min — ABNORMAL LOW (ref >60)
Glucose, Bld: 92 mg/dL (ref 65–99)
Potassium: 4.2 mmol/L (ref 3.5–5.1)
Sodium: 133 mmol/L — ABNORMAL LOW (ref 135–145)

## 2016-10-24 LAB — SURGICAL PCR SCREEN
MRSA, PCR: NEGATIVE
Staphylococcus aureus: POSITIVE — AB

## 2016-10-24 LAB — HIV ANTIBODY (ROUTINE TESTING W REFLEX): HIV Screen 4th Generation wRfx: NONREACTIVE

## 2016-10-24 SURGERY — IRRIGATION AND DEBRIDEMENT EXTREMITY
Anesthesia: General | Site: First Toe | Laterality: Right

## 2016-10-24 MED ORDER — MIDAZOLAM HCL 5 MG/5ML IJ SOLN
INTRAMUSCULAR | Status: DC | PRN
  Administered 2016-10-24: 2 mg via INTRAVENOUS

## 2016-10-24 MED ORDER — OXYCODONE HCL 5 MG PO TABS
10.0000 mg | ORAL_TABLET | ORAL | Status: DC | PRN
  Administered 2016-10-24 – 2016-10-25 (×3): 10 mg via ORAL
  Filled 2016-10-24 (×3): qty 2

## 2016-10-24 MED ORDER — CHLORHEXIDINE GLUCONATE 4 % EX LIQD: 60.0000 mL | Freq: Once | CUTANEOUS | Status: DC

## 2016-10-24 MED ORDER — HYDROMORPHONE HCL 1 MG/ML IJ SOLN
0.2500 mg | INTRAMUSCULAR | Status: DC | PRN
  Administered 2016-10-24: 0.5 mg via INTRAVENOUS

## 2016-10-24 MED ORDER — MIDAZOLAM HCL 2 MG/2ML IJ SOLN
INTRAMUSCULAR | Status: AC
  Filled 2016-10-24: qty 2

## 2016-10-24 MED ORDER — FENTANYL CITRATE (PF) 250 MCG/5ML IJ SOLN
INTRAMUSCULAR | Status: AC
  Filled 2016-10-24: qty 5

## 2016-10-24 MED ORDER — SODIUM CHLORIDE 0.9 % IR SOLN
Status: DC | PRN
Start: 2016-10-24 — End: 2016-10-24
  Administered 2016-10-24: 1000 mL

## 2016-10-24 MED ORDER — FENTANYL CITRATE (PF) 100 MCG/2ML IJ SOLN
INTRAMUSCULAR | Status: DC | PRN
  Administered 2016-10-24: 100 ug via INTRAVENOUS

## 2016-10-24 MED ORDER — ACETAMINOPHEN 325 MG PO TABS: 650.0000 mg | ORAL_TABLET | ORAL | Status: DC | PRN

## 2016-10-24 MED ORDER — SODIUM CHLORIDE 0.9% FLUSH
3.0000 mL | Freq: Two times a day (BID) | INTRAVENOUS | Status: DC
  Administered 2016-10-25: 3 mL via INTRAVENOUS

## 2016-10-24 MED ORDER — BUPIVACAINE HCL (PF) 0.25 % IJ SOLN
INTRAMUSCULAR | Status: AC
  Filled 2016-10-24: qty 30

## 2016-10-24 MED ORDER — PROPOFOL 10 MG/ML IV BOLUS
INTRAVENOUS | Status: AC
  Filled 2016-10-24: qty 20

## 2016-10-24 MED ORDER — PROMETHAZINE HCL 25 MG/ML IJ SOLN: 6.2500 mg | INTRAMUSCULAR | Status: DC | PRN

## 2016-10-24 MED ORDER — OXYCODONE-ACETAMINOPHEN 5-325 MG PO TABS
1.0000 | ORAL_TABLET | ORAL | Status: DC | PRN
  Administered 2016-10-24 – 2016-10-25 (×4): 2 via ORAL
  Filled 2016-10-24 (×4): qty 2

## 2016-10-24 MED ORDER — BUPIVACAINE HCL (PF) 0.5 % IJ SOLN
INTRAMUSCULAR | Status: DC | PRN
  Administered 2016-10-24: 5 mL

## 2016-10-24 MED ORDER — CLINDAMYCIN PHOSPHATE 600 MG/50ML IV SOLN
600.0000 mg | Freq: Three times a day (TID) | INTRAVENOUS | Status: DC
  Administered 2016-10-24 – 2016-10-25 (×3): 600 mg via INTRAVENOUS
  Filled 2016-10-24 (×6): qty 50

## 2016-10-24 MED ORDER — LIDOCAINE HCL 2 % IJ SOLN
INTRAMUSCULAR | Status: DC | PRN
Start: 2016-10-24 — End: 2016-10-24
  Administered 2016-10-24: 5 mL

## 2016-10-24 MED ORDER — SODIUM CHLORIDE 0.9% FLUSH: 3.0000 mL | INTRAVENOUS | Status: DC | PRN

## 2016-10-24 MED ORDER — LIDOCAINE HCL (CARDIAC) 20 MG/ML IV SOLN
INTRAVENOUS | Status: DC | PRN
  Administered 2016-10-24: 30 mg via INTRAVENOUS

## 2016-10-24 MED ORDER — LACTATED RINGERS IV SOLN
INTRAVENOUS | Status: DC
  Administered 2016-10-24: 18:00:00 via INTRAVENOUS

## 2016-10-24 MED ORDER — PROPOFOL 10 MG/ML IV BOLUS
INTRAVENOUS | Status: DC | PRN
  Administered 2016-10-24: 200 mg via INTRAVENOUS

## 2016-10-24 MED ORDER — HYDROMORPHONE HCL 1 MG/ML IJ SOLN
INTRAMUSCULAR | Status: AC
  Filled 2016-10-24: qty 0.5

## 2016-10-24 MED ORDER — SODIUM CHLORIDE 0.9 % IV SOLN
INTRAVENOUS | Status: DC
  Administered 2016-10-24: 12:00:00 via INTRAVENOUS

## 2016-10-24 MED ORDER — SODIUM CHLORIDE 0.9 % IV SOLN: 250.0000 mL | INTRAVENOUS | Status: DC | PRN

## 2016-10-24 MED ORDER — LACTATED RINGERS IV SOLN
INTRAVENOUS | Status: DC | PRN
  Administered 2016-10-24: 18:00:00 via INTRAVENOUS

## 2016-10-24 MED ORDER — LIDOCAINE HCL 2 % IJ SOLN
INTRAMUSCULAR | Status: AC
  Filled 2016-10-24: qty 20

## 2016-10-24 MED ORDER — MORPHINE SULFATE (PF) 4 MG/ML IV SOLN
4.0000 mg | Freq: Once | INTRAVENOUS | Status: AC
  Administered 2016-10-24: 4 mg via INTRAVENOUS
  Filled 2016-10-24: qty 1

## 2016-10-24 SURGICAL SUPPLY — 55 items
BANDAGE ACE 4X5 VEL STRL LF (GAUZE/BANDAGES/DRESSINGS) ×1 IMPLANT
BLADE SAW SGTL 83.5X18.5 (BLADE) ×1 IMPLANT
BLADE SURG 15 STRL LF DISP TIS (BLADE) ×3 IMPLANT
BLADE SURG 15 STRL SS (BLADE) ×2
BNDG CMPR 9X4 STRL LF SNTH (GAUZE/BANDAGES/DRESSINGS)
BNDG COHESIVE 1X5 TAN STRL LF (GAUZE/BANDAGES/DRESSINGS) ×1 IMPLANT
BNDG COHESIVE 6X5 TAN STRL LF (GAUZE/BANDAGES/DRESSINGS) IMPLANT
BNDG CONFORM 3 STRL LF (GAUZE/BANDAGES/DRESSINGS) ×1 IMPLANT
BNDG ESMARK 4X9 LF (GAUZE/BANDAGES/DRESSINGS) ×1 IMPLANT
BNDG GAUZE ELAST 4 BULKY (GAUZE/BANDAGES/DRESSINGS) ×3 IMPLANT
BOWL SMART MIX CTS (DISPOSABLE) IMPLANT
CHLORAPREP W/TINT 26ML (MISCELLANEOUS) ×2 IMPLANT
COVER SURGICAL LIGHT HANDLE (MISCELLANEOUS) ×2 IMPLANT
CUFF TOURNIQUET SINGLE 18IN (TOURNIQUET CUFF) ×1 IMPLANT
CUFF TOURNIQUET SINGLE 34IN LL (TOURNIQUET CUFF) ×1 IMPLANT
DRAPE C-ARM MINI 42X72 WSTRAPS (DRAPES) ×1 IMPLANT
DRAPE U-SHAPE 47X51 STRL (DRAPES) ×2 IMPLANT
DRSG PAD ABDOMINAL 8X10 ST (GAUZE/BANDAGES/DRESSINGS) ×1 IMPLANT
ELECT CAUTERY BLADE 6.4 (BLADE) ×2 IMPLANT
ELECT REM PT RETURN 9FT ADLT (ELECTROSURGICAL)
ELECTRODE REM PT RTRN 9FT ADLT (ELECTROSURGICAL) IMPLANT
GAUZE SPONGE 4X4 12PLY STRL (GAUZE/BANDAGES/DRESSINGS) ×3 IMPLANT
GAUZE XEROFORM 1X8 LF (GAUZE/BANDAGES/DRESSINGS) ×2 IMPLANT
GLOVE BIO SURGEON STRL SZ8 (GLOVE) ×2 IMPLANT
GLOVE BIOGEL PI IND STRL 8 (GLOVE) ×1 IMPLANT
GLOVE BIOGEL PI INDICATOR 8 (GLOVE) ×1
GOWN STRL REUS W/ TWL LRG LVL3 (GOWN DISPOSABLE) ×2 IMPLANT
GOWN STRL REUS W/TWL LRG LVL3 (GOWN DISPOSABLE) ×4
HANDPIECE INTERPULSE COAX TIP (DISPOSABLE)
KIT BASIN OR (CUSTOM PROCEDURE TRAY) ×2 IMPLANT
KIT ROOM TURNOVER OR (KITS) ×2 IMPLANT
MANIFOLD NEPTUNE II (INSTRUMENTS) ×2 IMPLANT
NDL 18GX1X1/2 (RX/OR ONLY) (NEEDLE) IMPLANT
NDL HYPO 25GX1X1/2 BEV (NEEDLE) ×1 IMPLANT
NEEDLE 18GX1X1/2 (RX/OR ONLY) (NEEDLE) ×2 IMPLANT
NEEDLE HYPO 25GX1X1/2 BEV (NEEDLE) ×2 IMPLANT
NS IRRIG 1000ML POUR BTL (IV SOLUTION) ×2 IMPLANT
PACK ORTHO EXTREMITY (CUSTOM PROCEDURE TRAY) ×2 IMPLANT
PAD ARMBOARD 7.5X6 YLW CONV (MISCELLANEOUS) ×3 IMPLANT
PAD CAST 4YDX4 CTTN HI CHSV (CAST SUPPLIES) ×1 IMPLANT
PADDING CAST COTTON 4X4 STRL (CAST SUPPLIES)
PADDING CAST COTTON 6X4 STRL (CAST SUPPLIES) ×1 IMPLANT
SCRUB BETADINE 4OZ XXX (MISCELLANEOUS) ×1 IMPLANT
SET HNDPC FAN SPRY TIP SCT (DISPOSABLE) IMPLANT
SOLUTION BETADINE 4OZ (MISCELLANEOUS) ×2 IMPLANT
STAPLER VISISTAT 35W (STAPLE) ×1 IMPLANT
STOCKINETTE IMPERVIOUS 9X36 MD (GAUZE/BANDAGES/DRESSINGS) ×2 IMPLANT
SUT PROLENE 3 0 PS 2 (SUTURE) ×1 IMPLANT
SWAB CULTURE LIQ STUART DBL (MISCELLANEOUS) ×2 IMPLANT
SWAB CULTURE LIQUID MINI MALE (MISCELLANEOUS) ×2 IMPLANT
SYR CONTROL 10ML LL (SYRINGE) ×2 IMPLANT
TOWEL OR 17X24 6PK STRL BLUE (TOWEL DISPOSABLE) ×1 IMPLANT
TOWEL OR 17X26 10 PK STRL BLUE (TOWEL DISPOSABLE) ×2 IMPLANT
TUBE CONNECTING 12X1/4 (SUCTIONS) ×2 IMPLANT
YANKAUER SUCT BULB TIP NO VENT (SUCTIONS) ×2 IMPLANT

## 2016-10-24 NOTE — Interval H&P Note (Signed)
History and Physical Interval Note:  10/24/2016 6:06 PM  Raymond Bowen  has presented today for surgery, with the diagnosis of Right foot wound  The various methods of treatment have been discussed with the patient and family. After consideration of risks, benefits and other options for treatment, the patient has consented to  Procedure(s): IRRIGATION AND DEBRIDEMENT FOOT (Right) as a surgical intervention .  The patient's history has been reviewed, patient examined, no change in status, stable for surgery.  I have reviewed the patient's chart and labs.  Questions were answered to the patient's satisfaction.     Felecia Shelling

## 2016-10-24 NOTE — Progress Notes (Addendum)
Report called to short stay. NO questions/complaints. Pt. Transferred to short stay via transport.
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
## 2016-10-24 NOTE — Anesthesia Preprocedure Evaluation (Addendum)
Anesthesia Evaluation  Patient identified by MRN, date of birth, ID band Patient awake    Reviewed: Allergy & Precautions, NPO status , Patient's Chart, lab work & pertinent test results  History of Anesthesia Complications Negative for: history of anesthetic complications  Airway Mallampati: II  TM Distance: >3 FB     Dental no notable dental hx. (+) Dental Advidsory Given   Pulmonary neg pulmonary ROS, Current Smoker, former smoker,    Pulmonary exam normal        Cardiovascular negative cardio ROS   Rhythm:Regular Rate:Normal     Neuro/Psych  Neuromuscular disease negative neurological ROS  negative psych ROS   GI/Hepatic negative GI ROS, Neg liver ROS,   Endo/Other  negative endocrine ROS  Renal/GU CRFRenal disease  negative genitourinary   Musculoskeletal negative musculoskeletal ROS (+)   Abdominal   Peds negative pediatric ROS (+)  Hematology  (+) anemia ,   Anesthesia Other Findings   Reproductive/Obstetrics negative OB ROS                            Lab Results  Component Value Date   WBC 13.1 (H) 10/24/2016   HGB 11.8 (L) 10/24/2016   HCT 35.5 (L) 10/24/2016   MCV 93.2 10/24/2016   PLT 311 10/24/2016   Lab Results  Component Value Date   CREATININE 1.60 (H) 10/24/2016   BUN 23 (H) 10/24/2016   NA 133 (L) 10/24/2016   K 4.2 10/24/2016   CL 105 10/24/2016   CO2 20 (L) 10/24/2016   Lab Results  Component Value Date   INR 1.21 06/24/2015   INR 1.26 06/23/2015   INR 1.28 06/22/2015     Anesthesia Physical  Anesthesia Plan  ASA: [name]  Anesthesia Plan: General   Post-op Pain Management:    Induction: Intravenous  Airway Management Planned: LMA  Additional Equipment:   Intra-op Plan:   Post-operative Plan: Post-operative intubation/ventilation  Informed Consent: I have reviewed the patients History and Physical, chart, labs and discussed the  procedure including the risks, benefits and alternatives for the proposed anesthesia with the patient or authorized representative who has indicated his/her understanding and acceptance.   Dental Advisory Given  Plan Discussed with: CRNA, Surgeon and Anesthesiologist  Anesthesia Plan Comments:         Anesthesia Quick Evaluation

## 2016-10-24 NOTE — Consult Note (Addendum)
WOC Nurse ostomy consult note Pt is familiar to [facility] team from multiple previous notes; refer to last consult note on 06/03/16 for social history and supply barriers. Pt has a difficult pouching situation and was assessed at [facility] for a possible ostomy reversal; he shares that he did not pass the lab test to assure he was free from using nicotine and was denied the surgery. Pt states he is purchasing his own pouches at this time but this is a hardship, since he does not have enough money, and has "about a 3 day wear time when it lasts that long." Stoma type/location: High output ileostomy to right abd is located low and near creases.   Stomal assessment/size: Stoma is flush with the skin level, red and viable, 3/4 inch Peristomal assessment:  Pt has previously reported problems with frequent peristomal skin breakdown, but this time skin is intact. Treatment options for stomal/peristomal skin: Pt usually crusts with skin prep and powder, this is not indicated today and powder is available at the bedside. Output: Large amt liquid green stool  Ostomy pouching: Pt prefers to wear a 2pc.system and use paste.  He needs to use convex but we do not have a 2-piece convex pouch in the [facility] formulary, and we do not have paste;  he declines offer of the one piece convex for use while in the hospital or a belt.  Education provided:  Demonstrated use of a barrier ring in place of ostomy paste, and applied two piece flat pouching system.  He requests an adhesive tape boarder be placed around the pouching system also in a window-pane fashion.   Pt is independent with pouch application and emptying when at home but is not able to see the stomal area well when in bed. He is able to assist and describe steps to the bedside nurses and denies need for further WOC assistance. Encouraged pt to stop smoking and pursue the colostomy reversal surgery, since he has had difficult with his ostomy for almost 2  years. 5 extra one-piece convex pouches given to the patient to use at home when he runs out of supplies.  Ordered five 2-piece pouching systems for use in the hospital with barrier rings. Pt denies further questions at this time.  Please re-consult if further assistance is needed.  Thank-you,  Cammie Mcgee MSN, RN, CWOCN, [place], CNS [phone]
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
## 2016-10-24 NOTE — Anesthesia Procedure Notes (Signed)
Procedure Name: LMA Insertion Date/Time: 10/24/2016 6:26 PM Performed by: Gwenyth Allegra Pre-anesthesia Checklist: Patient identified, Emergency Drugs available, Suction available, Patient being monitored and Timeout performed Patient Re-evaluated:Patient Re-evaluated prior to inductionOxygen Delivery Method: Circle system utilized Preoxygenation: Pre-oxygenation with 100% oxygen Intubation Type: IV induction LMA: LMA inserted LMA Size: 4.0 Number of attempts: 1 Placement Confirmation: breath sounds checked- equal and bilateral Tube secured with: Tape

## 2016-10-24 NOTE — Anesthesia Postprocedure Evaluation (Addendum)
Anesthesia Post Note  Patient: Raymond Bowen  Procedure(s) Performed: Procedure(s) (LRB): IRRIGATION AND DEBRIDEMENT FOOT (Right)  Patient location during evaluation: PACU Anesthesia Type: General Level of consciousness: sedated Pain management: pain level controlled Vital Signs Assessment: post-procedure vital signs reviewed and stable Respiratory status: spontaneous breathing and respiratory function stable Cardiovascular status: stable Anesthetic complications: no       Last Vitals:  Vitals:   10/24/16 1942 10/24/16 2000  BP: 103/66 112/69  Pulse: 73 74  Resp: 13 16  Temp: 36.5 C 36.4 C    Last Pain:  Vitals:   10/24/16 2000  TempSrc: Oral  PainSc:                  Kataleya Zaugg DANIEL

## 2016-10-24 NOTE — Care Management Note (Signed)
Case Management Note  Patient Details  Name: Raymond Bowen MRN: [id number] Date of Birth: 01-Jan-1976  Subjective/Objective:  From home ,presents with right foot abscess, for I and D today. He is established at the [facility] clinic , will need to follow up with the clinic after discharge for med ast and apt.  NCM will cont to follow for dc needs.                    Action/Plan:   Expected Discharge Date:   (unknown)               Expected Discharge Plan:  Home/Self Care  In-House Referral:     Discharge planning Services  CM Consult, Medication Assistance, Indigent Health Clinic  Post Acute Care Choice:    Choice offered to:     DME Arranged:    DME Agency:     HH Arranged:    HH Agency:     Status of Service:  In process, will continue to follow  If discussed at Long Length of Stay Meetings, dates discussed:    Additional Comments:  Leone Haven, RN 10/24/2016, 3:54 PM

## 2016-10-24 NOTE — Transfer of Care (Signed)
Immediate Anesthesia Transfer of Care Note  Patient: Raymond Bowen  Procedure(s) Performed: Procedure(s): IRRIGATION AND DEBRIDEMENT FOOT (Right)  Patient Location: PACU  Anesthesia Type:General  Level of Consciousness: awake, alert  and oriented  Airway & Oxygen Therapy: Patient Spontanous Breathing  Post-op Assessment: Report given to RN and Post -op Vital signs reviewed and stable  Post vital signs: Reviewed and stable  Last Vitals:  Vitals:   10/24/16 1429 10/24/16 1854  BP: 116/67 (!) (P) 125/96  Pulse: 77 83  Resp: 18 16  Temp: 36.7 C 36.7 C    Last Pain:  Vitals:   10/24/16 1429  TempSrc: Oral  PainSc: 6       Patients Stated Pain Goal: 0 (10/24/16 1429)  Complications: No apparent anesthesia complications

## 2016-10-25 ENCOUNTER — Inpatient Hospital Stay (HOSPITAL_COMMUNITY): Payer: Medicaid Other

## 2016-10-25 ENCOUNTER — Encounter (HOSPITAL_COMMUNITY): Payer: Self-pay | Admitting: Podiatry

## 2016-10-25 DIAGNOSIS — L03115 Cellulitis of right lower limb: Principal | ICD-10-CM

## 2016-10-25 DIAGNOSIS — Z86718 Personal history of other venous thrombosis and embolism: Secondary | ICD-10-CM

## 2016-10-25 DIAGNOSIS — L02611 Cutaneous abscess of right foot: Secondary | ICD-10-CM

## 2016-10-25 MED ORDER — MORPHINE SULFATE (PF) 2 MG/ML IV SOLN
2.0000 mg | Freq: Once | INTRAVENOUS | Status: AC
  Administered 2016-10-25: 2 mg via INTRAVENOUS
  Filled 2016-10-25: qty 1

## 2016-10-25 MED ORDER — AMOXICILLIN-POT CLAVULANATE 875-125 MG PO TABS: 1.0000 | ORAL_TABLET | Freq: Two times a day (BID) | ORAL | 0 refills | Status: DC

## 2016-10-25 MED ORDER — OXYCODONE-ACETAMINOPHEN 5-325 MG PO TABS: 2.0000 | ORAL_TABLET | Freq: Four times a day (QID) | ORAL | 0 refills | Status: DC | PRN

## 2016-10-25 MED ORDER — OXYCODONE HCL 5 MG PO TABS
5.0000 mg | ORAL_TABLET | Freq: Once | ORAL | Status: AC
Start: 2016-10-25 — End: 2016-10-25
  Administered 2016-10-25: 5 mg via ORAL
  Filled 2016-10-25: qty 1
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
## 2016-10-25 NOTE — Brief Op Note (Signed)
10/23/2016 - 10/24/2016  7:23 AM  PATIENT:  Raymond Bowen  41 y.o. male  PRE-OPERATIVE DIAGNOSIS:  Right foot wound  POST-OPERATIVE DIAGNOSIS:  Right foot wound  PROCEDURE:  Procedure(s): IRRIGATION AND DEBRIDEMENT FOOT (Right) INCISION AND DRAINAGE RIGHT FOOT  SURGEON:  Surgeon(s) and Role:    * Edrick Kins, DPM - Primary  PHYSICIAN ASSISTANT:   ASSISTANTS: none   ANESTHESIA:   local and MAC  EBL:  No intake/output data recorded.  BLOOD ADMINISTERED:none  DRAINS: none   LOCAL MEDICATIONS USED:  MARCAINE    and LIDOCAINE   SPECIMEN:  Source of Specimen:  CULTURE ABSCESS RIGHT FOOT  DISPOSITION OF SPECIMEN:  PATHOLOGY  COUNTS:  YES  TOURNIQUET:  * No tourniquets in log *  DICTATION: .Dragon Dictation  PLAN OF CARE: CURRENTLY INPATIENT  PATIENT DISPOSITION:  PACU - hemodynamically stable.   Delay start of Pharmacological VTE agent (>24hrs) due to surgical blood loss or risk of bleeding: yes  Edrick Kins, DPM [facility]  Dr. Edrick Kins, [place]                                        [place], [place] [address]                Office [phone]  Fax [phone]

## 2016-10-25 NOTE — Progress Notes (Signed)
Tech offered pt a bath. Pt stated not right now and he will do it later. Tech gave all bath supplies to Pt. And asked him to notify her for assistance with bath. Pt then stated, he did not need any assistance and he will take care of bath hisself.

## 2016-10-25 NOTE — Progress Notes (Signed)
Pt c/o of right foot surgical pain. PRN oxycodone  and percocet given with minimal relief. Pt requesting pain meds. Merdis Delay NP made aware. X1 Morphine and 5 mg oxycodone ordered and given. Will continue to monitor pt.

## 2016-10-25 NOTE — Discharge Instructions (Signed)
Apply clean dressing to the rt foot.

## 2016-10-25 NOTE — Progress Notes (Signed)
[name] to be D/C'd  per MD order. Discussed with the patient and all questions fully answered.  VSS, Skin clean, dry and intact without evidence of skin break down, no evidence of skin tears noted.  IV catheter discontinued intact. Site without signs and symptoms of complications. Dressing and pressure applied.  An After Visit Summary was printed and given to the patient. Patient received prescription.  D/c education completed with patient/family including follow up instructions, medication list, d/c activities limitations if indicated, with other d/c instructions as indicated by MD - patient able to verbalize understanding, all questions fully answered.   Patient instructed to return to ED, call 911, or call MD for any changes in condition.   Patient to be escorted via WC, and D/C home via private auto.
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
## 2016-10-25 NOTE — Progress Notes (Signed)
Orthopedic Tech Progress Note Patient Details:  Raymond Bowen 1976/04/10 [id number]  Ortho Devices Type of Ortho Device: Postop shoe/boot Ortho Device/Splint Location: rle Ortho Device/Splint Interventions: Application   Raymond Bowen 10/25/2016, 8:06 AM

## 2016-10-25 NOTE — Progress Notes (Signed)
MD Logan Bores wrote his d/c prescriptions on a small prescription pad paper and placed it in the chart. MD Dhungel wrote d/c prescriptions that were different that what MD [facility] wrote. I clarified with MD Dhungel which ones I should give him and he said the ones he wrote which are in the AVS. The other prescription I will place in the shred bin.

## 2016-10-26 LAB — AEROBIC CULTURE W GRAM STAIN (SUPERFICIAL SPECIMEN)
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
## 2016-10-29 LAB — AEROBIC/ANAEROBIC CULTURE W GRAM STAIN (SURGICAL/DEEP WOUND)

## 2016-11-01 ENCOUNTER — Emergency Department (HOSPITAL_COMMUNITY)
Admission: EM | Admit: 2016-11-01 | Discharge: 2016-11-01 | Disposition: A | Discharge status: Home / Self Care | Payer: PRIVATE HEALTH INSURANCE | Attending: Emergency Medicine | Admitting: Emergency Medicine

## 2016-11-01 ENCOUNTER — Encounter (HOSPITAL_COMMUNITY): Payer: Self-pay

## 2016-11-01 ENCOUNTER — Emergency Department (HOSPITAL_COMMUNITY): Payer: PRIVATE HEALTH INSURANCE

## 2016-11-01 DIAGNOSIS — Z87891 Personal history of nicotine dependence: Secondary | ICD-10-CM | POA: Insufficient documentation

## 2016-11-01 DIAGNOSIS — K9403 Colostomy malfunction: Secondary | ICD-10-CM | POA: Diagnosis present

## 2016-11-01 DIAGNOSIS — Z7901 Long term (current) use of anticoagulants: Secondary | ICD-10-CM | POA: Diagnosis not present

## 2016-11-01 DIAGNOSIS — Z433 Encounter for attention to colostomy: Secondary | ICD-10-CM

## 2016-11-01 DIAGNOSIS — R109 Unspecified abdominal pain: Secondary | ICD-10-CM | POA: Diagnosis not present

## 2016-11-01 LAB — CBC WITH DIFFERENTIAL/PLATELET
Basophils Absolute: 0 10*3/uL (ref 0.0–0.1)
Basophils Relative: 0 %
Eosinophils Absolute: 0.3 10*3/uL (ref 0.0–0.7)
Eosinophils Relative: 2 %
HCT: 41.9 % (ref 39.0–52.0)
Hemoglobin: 14 g/dL (ref 13.0–17.0)
Lymphocytes Relative: 24 %
Lymphs Abs: 3.8 10*3/uL (ref 0.7–4.0)
MCH: 31.4 pg (ref 26.0–34.0)
MCHC: 33.4 g/dL (ref 30.0–36.0)
MCV: 93.9 fL (ref 78.0–100.0)
Monocytes Absolute: 0.7 10*3/uL (ref 0.1–1.0)
Monocytes Relative: 5 %
Neutro Abs: 10.6 10*3/uL — ABNORMAL HIGH (ref 1.7–7.7)
Neutrophils Relative %: 69 %
Platelets: 337 10*3/uL (ref 150–400)
RBC: 4.46 MIL/uL (ref 4.22–5.81)
RDW: 15.1 % (ref 11.5–15.5)
WBC: 15.5 10*3/uL — ABNORMAL HIGH (ref 4.0–10.5)

## 2016-11-01 LAB — URINALYSIS, ROUTINE W REFLEX MICROSCOPIC
Bilirubin Urine: NEGATIVE
Glucose, UA: NEGATIVE mg/dL
Hgb urine dipstick: NEGATIVE
Ketones, ur: NEGATIVE mg/dL
Leukocytes, UA: NEGATIVE
Nitrite: NEGATIVE
Protein, ur: 30 mg/dL — AB
Specific Gravity, Urine: 1.012 (ref 1.005–1.030)
Squamous Epithelial / LPF: NONE SEEN
pH: 6 (ref 5.0–8.0)

## 2016-11-01 LAB — COMPREHENSIVE METABOLIC PANEL
ALT: 28 U/L (ref 17–63)
AST: 30 U/L (ref 15–41)
Albumin: 4.3 g/dL (ref 3.5–5.0)
Alkaline Phosphatase: 92 U/L (ref 38–126)
Anion gap: 11 (ref 5–15)
BUN: 13 mg/dL (ref 6–20)
CO2: 16 mmol/L — ABNORMAL LOW (ref 22–32)
Calcium: 9.5 mg/dL (ref 8.9–10.3)
Chloride: 109 mmol/L (ref 101–111)
Creatinine, Ser: 1.44 mg/dL — ABNORMAL HIGH (ref 0.61–1.24)
GFR calc Af Amer: >60 mL/min (ref >60)
GFR calc non Af Amer: >60 mL/min (ref >60)
Glucose, Bld: 79 mg/dL (ref 65–99)
Potassium: 3.6 mmol/L (ref 3.5–5.1)
Sodium: 136 mmol/L (ref 135–145)
Total Bilirubin: 0.1 mg/dL — ABNORMAL LOW (ref 0.3–1.2)
Total Protein: 8.6 g/dL — ABNORMAL HIGH (ref 6.5–8.1)

## 2016-11-01 LAB — I-STAT CG4 LACTIC ACID, ED
Lactic Acid, Venous: 1.99 mmol/L (ref 0.5–1.9)
Lactic Acid, Venous: 2.24 mmol/L (ref 0.5–1.9)
Lactic Acid, Venous: 1.82 mmol/L (ref 0.5–1.9)

## 2016-11-01 MED ORDER — IOPAMIDOL (ISOVUE-300) INJECTION 61%
INTRAVENOUS | Status: AC
  Administered 2016-11-01: 100 mL
  Filled 2016-11-01: qty 100

## 2016-11-01 MED ORDER — HYDROCODONE-ACETAMINOPHEN 5-325 MG PO TABS
1.0000 | ORAL_TABLET | Freq: Once | ORAL | Status: AC
  Administered 2016-11-01: 1 via ORAL
  Filled 2016-11-01: qty 1

## 2016-11-01 MED ORDER — SODIUM CHLORIDE 0.9 % IV BOLUS (SEPSIS)
500.0000 mL | Freq: Once | INTRAVENOUS | Status: AC
  Administered 2016-11-01: 500 mL via INTRAVENOUS

## 2016-11-01 MED ORDER — SODIUM CHLORIDE 0.9 % IV BOLUS (SEPSIS)
1000.0000 mL | Freq: Once | INTRAVENOUS | Status: AC
  Administered 2016-11-01: 1000 mL via INTRAVENOUS

## 2016-11-01 NOTE — ED Notes (Signed)
Colostomy bag changed

## 2016-11-01 NOTE — Discharge Instructions (Signed)
Your imaging was normal. All of your labs have normalized. Motrin and Tylenol for pain. Follow up with her primary care doctor. Use the colostomy supplies as directed. Return if symptoms worsen.

## 2016-11-01 NOTE — ED Notes (Signed)
Wound care nurse in room changing colostomy at this time

## 2016-11-01 NOTE — ED Triage Notes (Signed)
Per [facility], pt complains of colostomy bag leaking and pain around the site. VS 130/98, HR 106, 99% on RA, RR 18.
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
## 2016-11-01 NOTE — ED Notes (Signed)
WOCN called for ostomy assessment

## 2016-11-01 NOTE — Consult Note (Addendum)
WOC Nurse ostomy consult note Pt is familiar to [facility] team from multiple previous notes; refer to last consult note on 4/5. Pt has a difficult pouching situation and was assessed at [facility] for a possible ostomy reversal; he shares that he did not pass the lab test to assure he was free from using nicotine and was denied the surgery. Pt states he is out of pouches at this time, since he does not have enough money, and has "about a 3 day or less wear time." Previous pouch was leaking. He states he has pain inside his abd, which is a new development, and has just returned from CT scan to assess. Stoma type/location: High output ileostomy to right abd is located low and near creases.   Stomal assessment/size: Stoma is flush with the skin level, red and viable, 3/4 inch Peristomal assessment:  Pt has previously reported problems with frequent peristomal skin breakdown, skin red, macerated, and painful to touch to 4 cm surrounding ostomy and wafer. Treatment options for stomal/peristomal skin: Crusted with powder Output: Large amt liquid green stool  Ostomy pouching: Pt prefers to wear a 2pc.system and use paste.  He needs to use convex but we do not have a 2-piece convex pouch in the [facility]  System formulary; he declines offer of the one piece convex for use while in the hospital or a belt.  Education provided:  Demonstrated use of a barrier ring in place of ostomy paste, and applied two piece flat pouching system. He requests an adhesive tape boarder be placed around the pouching system, although this is covering the macerated area also, in a window-pane fashion.   Pt is independent with pouch application and emptying when at home but is not able to see the stomal area well when in bed. He is able to assist and describe steps to the bedside nurses and denies need for further WOC assistance. Encouraged pt to stop smoking and pursue the colostomy reversal surgery, since he has had difficult with his  ostomy for almost 2 years. 7 extra two-piece pouches, powder, paste, and tape given to the patient to use at home when he runs out of supplies. Pt denies further questions at this time.  Please re-consult if further assistance is needed.  Thank-you,  Cammie Mcgee MSN, RN, CWOCN, [place], CNS [phone]

## 2016-11-01 NOTE — ED Provider Notes (Signed)
Patient was receiving care hand off from [facility]. Please see prior note for complete history and physical. Patient was noted to have a trending lactic acid. He was given fluids. Plan was to discharge if lactic acid improved. Patient was given 1.5 L of fluids with improvement in his lactic acidosis. Last lactic acid was 1.8. On reassessment patient states that no one addressed his abdominal pain and he is continuing to have pain and leaking from his colostomy site. He does have mild tenderness around the abdominal colostomy site. He does have leaking around the site even after new change. He asked for a wound care consult. Though this is necessary and wound care was consulted. They gave patient supplies and re fixed the colostomy site without any leaking. Patient was asking that we image his abdomen because he is having abdominal pain and is asking for pain medication. To give him an oral Vicodin with improvement in his pain. CT shows no acute findings. Repeat abdominal exam was benign after pain medicine. Patient is able to tolerate by mouth fluids. Vital signs are stable. He is afebrile. No tachycardia or hypotension. Labs are at baseline. He does have a leukocytosis which seems to be baseline for patient. Patient be discharged home with follow-up with PCP and outpatient setting. He is hemodynamically stable. Nontoxic appearing. In no acute distress. Repeat abdominal exam shows no peritoneal signs. Patient verbalized understanding the plan of care. All questions were answered prior to discharge.   Rise Mu, PA-C 11/01/16 1058    Tilden Fossa, MD 11/02/16 [phone]

## 2016-11-20 ENCOUNTER — Ambulatory Visit: Payer: PRIVATE HEALTH INSURANCE | Attending: Family Medicine | Admitting: Family Medicine

## 2016-11-20 ENCOUNTER — Encounter: Payer: Self-pay | Admitting: Family Medicine

## 2016-11-20 ENCOUNTER — Other Ambulatory Visit: Payer: Self-pay

## 2016-11-20 VITALS — BP 126/79 | HR 84 | Temp 98.2°F | Resp 16 | Ht 68.0 in | Wt 193.0 lb

## 2016-11-20 DIAGNOSIS — Z09 Encounter for follow-up examination after completed treatment for conditions other than malignant neoplasm: Secondary | ICD-10-CM | POA: Diagnosis present

## 2016-11-20 DIAGNOSIS — M792 Neuralgia and neuritis, unspecified: Secondary | ICD-10-CM

## 2016-11-20 DIAGNOSIS — K219 Gastro-esophageal reflux disease without esophagitis: Secondary | ICD-10-CM | POA: Diagnosis not present

## 2016-11-20 DIAGNOSIS — I82511 Chronic embolism and thrombosis of right femoral vein: Secondary | ICD-10-CM | POA: Insufficient documentation

## 2016-11-20 DIAGNOSIS — M21371 Foot drop, right foot: Secondary | ICD-10-CM

## 2016-11-20 DIAGNOSIS — Z7901 Long term (current) use of anticoagulants: Secondary | ICD-10-CM | POA: Diagnosis not present

## 2016-11-20 DIAGNOSIS — L03115 Cellulitis of right lower limb: Secondary | ICD-10-CM

## 2016-11-20 DIAGNOSIS — Z932 Ileostomy status: Secondary | ICD-10-CM

## 2016-11-20 MED ORDER — CEPHALEXIN 500 MG PO CAPS
500.0000 mg | ORAL_CAPSULE | Freq: Two times a day (BID) | ORAL | 0 refills | Status: DC
Start: 2016-11-20 — End: 2017-01-05

## 2016-11-20 MED ORDER — GABAPENTIN 600 MG PO TABS: 600.0000 mg | ORAL_TABLET | Freq: Three times a day (TID) | ORAL | 3 refills | Status: DC

## 2016-11-20 MED ORDER — OMEPRAZOLE 20 MG PO CPDR
20.0000 mg | DELAYED_RELEASE_CAPSULE | Freq: Every day | ORAL | 3 refills | Status: DC
Start: 2016-11-20 — End: 2017-01-05

## 2016-11-20 MED ORDER — RIVAROXABAN 20 MG PO TABS: 20.0000 mg | ORAL_TABLET | Freq: Every day | ORAL | 3 refills | Status: DC

## 2016-11-20 MED FILL — CEPHALEXIN 500 MG CAPSULE: 500 | 10 days supply | Qty: 20 | Fill #0

## 2016-11-20 MED FILL — **XARELTO 20 MG TABLET: 20 MG | 3 days supply | Qty: 3 | Fill #0

## 2016-11-20 MED FILL — GABAPENTIN 600 MG TABLET: 600 | 30 days supply | Qty: 90 | Fill #0

## 2016-11-20 MED FILL — OMEPRAZOLE DR 20 MG CAPSULE: 20 | 30 days supply | Qty: 30 | Fill #0

## 2016-11-20 NOTE — Patient Instructions (Signed)
Cellulitis, Adult Cellulitis is a skin infection. The infected area is usually red and sore. This condition occurs most often in the arms and lower legs. It is very important to get treated for this condition. Follow these instructions at home:  Take over-the-counter and prescription medicines only as told by your doctor.  If you were prescribed an antibiotic medicine, take it as told by your doctor. Do not stop taking the antibiotic even if you start to feel better.  Drink enough fluid to keep your pee (urine) clear or pale yellow.  Do not touch or rub the infected area.  Raise (elevate) the infected area above the level of your heart while you are sitting or lying down.  Place warm or cold wet cloths (warm or cold compresses) on the infected area. Do this as told by your doctor.  Keep all follow-up visits as told by your doctor. This is important. These visits let your doctor make sure your infection is not getting worse. Contact a doctor if:  You have a fever.  Your symptoms do not get better after 1-2 days of treatment.  Your bone or joint under the infected area starts to hurt after the skin has healed.  Your infection comes back. This can happen in the same area or another area.  You have a swollen bump in the infected area.  You have new symptoms.  You feel ill and also have muscle aches and pains. Get help right away if:  Your symptoms get worse.  You feel very sleepy.  You throw up (vomit) or have watery poop (diarrhea) for a long time.  There are red streaks coming from the infected area.  Your red area gets larger.  Your red area turns darker. This information is not intended to replace advice given to you by your health care provider. Make sure you discuss any questions you have with your health care provider. Document Released: 12/25/2007 Document Revised: 12/14/2015 Document Reviewed: 05/17/2015 Elsevier Interactive Patient Education  2017 [employer]  
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
## 2016-11-22 ENCOUNTER — Ambulatory Visit: Payer: Self-pay

## 2016-11-27 ENCOUNTER — Ambulatory Visit: Payer: Self-pay | Attending: Family Medicine

## 2016-12-02 ENCOUNTER — Encounter (HOSPITAL_COMMUNITY): Payer: Self-pay

## 2016-12-02 ENCOUNTER — Emergency Department (HOSPITAL_COMMUNITY)
Admission: EM | Admit: 2016-12-02 | Discharge: 2016-12-02 | Disposition: A | Discharge status: Home / Self Care | Payer: PRIVATE HEALTH INSURANCE | Attending: Dermatology | Admitting: Dermatology

## 2016-12-02 DIAGNOSIS — Z433 Encounter for attention to colostomy: Secondary | ICD-10-CM | POA: Insufficient documentation

## 2016-12-02 DIAGNOSIS — Z87891 Personal history of nicotine dependence: Secondary | ICD-10-CM | POA: Insufficient documentation

## 2016-12-02 DIAGNOSIS — Z7902 Long term (current) use of antithrombotics/antiplatelets: Secondary | ICD-10-CM | POA: Insufficient documentation

## 2016-12-02 DIAGNOSIS — Z79899 Other long term (current) drug therapy: Secondary | ICD-10-CM | POA: Diagnosis not present

## 2016-12-02 DIAGNOSIS — Z5321 Procedure and treatment not carried out due to patient leaving prior to being seen by health care provider: Secondary | ICD-10-CM | POA: Insufficient documentation

## 2016-12-02 NOTE — ED Notes (Signed)
Gave pt colostomy supplies to change colostomy and pt states he is going to leave and doesn't need to see the doctor.

## 2016-12-02 NOTE — ED Triage Notes (Signed)
States ran out of colostomy bags and needs replacements till Tuesday.

## 2016-12-09 ENCOUNTER — Telehealth: Payer: Self-pay | Admitting: Family Medicine

## 2016-12-09 NOTE — Telephone Encounter (Signed)
Patient called the office to inform PCP that pt's lawyers Gery PraySusan O' Malley will be faxing documents for PCP to fill out. Pt stated that his lawyer needs these documents returned nubiwithin the next 20 days. Please follow up.  Thank you.

## 2016-12-10 NOTE — Telephone Encounter (Signed)
MA will look out for paperwork

## 2016-12-18 ENCOUNTER — Emergency Department (HOSPITAL_COMMUNITY)
Admission: EM | Admit: 2016-12-18 | Discharge: 2016-12-18 | Disposition: A | Discharge status: Home / Self Care | Payer: PRIVATE HEALTH INSURANCE | Attending: Emergency Medicine | Admitting: Emergency Medicine

## 2016-12-18 ENCOUNTER — Encounter (HOSPITAL_COMMUNITY): Payer: Self-pay | Admitting: Emergency Medicine

## 2016-12-18 DIAGNOSIS — Z87891 Personal history of nicotine dependence: Secondary | ICD-10-CM | POA: Diagnosis not present

## 2016-12-18 DIAGNOSIS — M79671 Pain in right foot: Secondary | ICD-10-CM | POA: Insufficient documentation

## 2016-12-18 DIAGNOSIS — Z7901 Long term (current) use of anticoagulants: Secondary | ICD-10-CM | POA: Diagnosis not present

## 2016-12-18 DIAGNOSIS — Z79899 Other long term (current) drug therapy: Secondary | ICD-10-CM | POA: Insufficient documentation

## 2016-12-18 DIAGNOSIS — R1031 Right lower quadrant pain: Secondary | ICD-10-CM | POA: Diagnosis not present

## 2016-12-18 MED ORDER — HYDROCODONE-ACETAMINOPHEN 5-325 MG PO TABS
1.0000 | ORAL_TABLET | Freq: Once | ORAL | Status: DC
  Filled 2016-12-18: qty 1

## 2016-12-18 MED ORDER — OXYCODONE-ACETAMINOPHEN 5-325 MG PO TABS
1.0000 | ORAL_TABLET | Freq: Once | ORAL | Status: AC
  Administered 2016-12-18: 1 via ORAL
  Filled 2016-12-18: qty 1

## 2016-12-18 NOTE — Consult Note (Addendum)
[facility] Nurse ostomy [facility]note [facility]; refer to last consult note on 4/13. Pt has a difficult pouching situation and [facility]; he states he will be going for another appointment in a few weeks. Pt states he is out of pouches at this time, since he does not have enough money, and has "about a 2 day or less wear time." Previous pouch was leaking. He states skin is burning and painful. Stoma type/location: High output ileostomy to right abd is located low and near creases.  Stomal assessment/size: Stoma is flush with the skin level, red and viable, 3/4 inch Peristomal assessment: Pt has previously reported problems with frequent peristomal skin breakdown, skin red, macerated, and painful to touch to 2 cm surrounding ostomy and wafer. Treatment options for stomal/peristomal skin: Crusted with powder Output: Large amt liquid green stool Ostomy pouching: Pt prefers to wear a 2pc.system and use paste and a belt.  Education provided: Skin crusted with powder and paste and applied two piece flat pouching system. He requests an adhesive tape boarder be placed around the pouching system in a window-pane fashion.  Pt is independent with pouch application and emptying when at home. 12 extra two-piece pouches, powder, paste, and tape given to the patient to use at home when he runs out of supplies, also 10 closed end one piece pouches. Pt denies further questions at this time.  Please re-consult if further assistance is needed. Thank-you,  Cammie Mcgeeawn Aryaa Bunting MSN, RN, CWOCN, [facility], CNS [phone]

## 2016-12-18 NOTE — ED Notes (Signed)
In to give vicodin, pt reports would rather have percocet. Dr. Jeraldine LootsLockwood made aware.

## 2016-12-18 NOTE — ED Notes (Signed)
Is requesting to see the [facility] nurse.

## 2016-12-18 NOTE — ED Notes (Addendum)
Pt has all supplies from [facility]. Requesting rx for percocet. Dr. Jeraldine LootsLockwood made aware.

## 2016-12-18 NOTE — ED Triage Notes (Signed)
Pt sts burning at ostomy bag site and right foot pain chronic in nature; pt was here 2 days ago asking for case manager to get him colostomy supplies and told that he needed to contact his supplier by Burna MortimerWanda case Production designer, theatre/television/filmmanager

## 2016-12-18 NOTE — Discharge Instructions (Signed)
As discussed, your evaluation today has been largely reassuring.  But, it is important that you monitor your condition carefully, and do not hesitate to return to the ED if you develop new, or concerning changes in your condition. ? ?Otherwise, please follow-up with your physician for appropriate ongoing care. ? ?

## 2016-12-18 NOTE — Discharge Planning (Signed)
[facility]  [facility].
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
## 2016-12-21 NOTE — Addendum Note (Signed)
Addendum  created 12/21/16 0815 by Nanea Jared, MD   Sign clinical note    

## 2016-12-24 ENCOUNTER — Telehealth: Payer: Self-pay | Admitting: Family Medicine

## 2016-12-24 NOTE — Telephone Encounter (Signed)
Patient called requesting status of disability paperwork, patient states his lawyer has not received it. Please f/up

## 2016-12-25 NOTE — Telephone Encounter (Signed)
Pt. Called requesting the status of his disability paperwork that was faxed by his lawyer. Please f/u with pt.

## 2016-12-30 NOTE — Telephone Encounter (Signed)
PT called to find out about the disability paper, I was trying to informed him that I will call him back with an answer since is not respond from the last called he made, PT said that he will coming to the office and hung up

## 2016-12-31 NOTE — Telephone Encounter (Signed)
Paperwork has been faxed to attorney.  Called patient and left vm asking to cal back..Marland Kitchen

## 2016-12-31 NOTE — Telephone Encounter (Signed)
PT called again today and demand to speak with a Software engineersupervisor or manager. Will forward the info. To carmen

## 2017-01-01 NOTE — Telephone Encounter (Signed)
PT called again and he was informed of the msg, that Carmen left on his VM, it see he never got it now he said that he will called his lawyer to confirm that the fax was sent,

## 2017-01-05 ENCOUNTER — Emergency Department (HOSPITAL_COMMUNITY)
Admission: EM | Admit: 2017-01-05 | Discharge: 2017-01-05 | Disposition: A | Discharge status: Home / Self Care | Payer: Medicaid Other | Attending: Emergency Medicine | Admitting: Emergency Medicine

## 2017-01-05 ENCOUNTER — Emergency Department (HOSPITAL_COMMUNITY): Payer: Medicaid Other

## 2017-01-05 ENCOUNTER — Encounter (HOSPITAL_COMMUNITY): Payer: Self-pay | Admitting: Nurse Practitioner

## 2017-01-05 DIAGNOSIS — Z7902 Long term (current) use of antithrombotics/antiplatelets: Secondary | ICD-10-CM | POA: Diagnosis not present

## 2017-01-05 DIAGNOSIS — Z79899 Other long term (current) drug therapy: Secondary | ICD-10-CM | POA: Diagnosis not present

## 2017-01-05 DIAGNOSIS — Z87891 Personal history of nicotine dependence: Secondary | ICD-10-CM | POA: Diagnosis not present

## 2017-01-05 DIAGNOSIS — R1030 Lower abdominal pain, unspecified: Secondary | ICD-10-CM | POA: Insufficient documentation

## 2017-01-05 DIAGNOSIS — R109 Unspecified abdominal pain: Secondary | ICD-10-CM

## 2017-01-05 LAB — COMPREHENSIVE METABOLIC PANEL
ALT: 26 U/L (ref 17–63)
AST: 29 U/L (ref 15–41)
Albumin: 4.1 g/dL (ref 3.5–5.0)
Alkaline Phosphatase: 91 U/L (ref 38–126)
Anion gap: 10 (ref 5–15)
BUN: 27 mg/dL — ABNORMAL HIGH (ref 6–20)
CO2: 17 mmol/L — ABNORMAL LOW (ref 22–32)
Calcium: 9.7 mg/dL (ref 8.9–10.3)
Chloride: 111 mmol/L (ref 101–111)
Creatinine, Ser: 1.77 mg/dL — ABNORMAL HIGH (ref 0.61–1.24)
GFR calc Af Amer: 55 mL/min — ABNORMAL LOW (ref >60)
GFR calc non Af Amer: 47 mL/min — ABNORMAL LOW (ref >60)
Glucose, Bld: 100 mg/dL — ABNORMAL HIGH (ref 65–99)
Potassium: 3.6 mmol/L (ref 3.5–5.1)
Sodium: 138 mmol/L (ref 135–145)
Total Bilirubin: 0.3 mg/dL (ref 0.3–1.2)
Total Protein: 8.3 g/dL — ABNORMAL HIGH (ref 6.5–8.1)

## 2017-01-05 LAB — URINALYSIS, ROUTINE W REFLEX MICROSCOPIC
Glucose, UA: NEGATIVE mg/dL
Ketones, ur: NEGATIVE mg/dL
Leukocytes, UA: NEGATIVE
Nitrite: NEGATIVE
Protein, ur: 100 mg/dL — AB
Specific Gravity, Urine: >1.03 — ABNORMAL HIGH (ref 1.005–1.030)
pH: 6 (ref 5.0–8.0)

## 2017-01-05 LAB — CBC
HCT: 40.6 % (ref 39.0–52.0)
Hemoglobin: 14.2 g/dL (ref 13.0–17.0)
MCH: 32.9 pg (ref 26.0–34.0)
MCHC: 35 g/dL (ref 30.0–36.0)
MCV: 94.2 fL (ref 78.0–100.0)
Platelets: 236 10*3/uL (ref 150–400)
RBC: 4.31 MIL/uL (ref 4.22–5.81)
RDW: 13.6 % (ref 11.5–15.5)
WBC: 14.6 10*3/uL — ABNORMAL HIGH (ref 4.0–10.5)

## 2017-01-05 LAB — URINALYSIS, MICROSCOPIC (REFLEX)

## 2017-01-05 LAB — LIPASE, BLOOD: Lipase: 26 U/L (ref 11–51)

## 2017-01-05 MED ORDER — FENTANYL CITRATE (PF) 100 MCG/2ML IJ SOLN
50.0000 ug | Freq: Once | INTRAMUSCULAR | Status: AC
  Administered 2017-01-05: 50 ug via INTRAVENOUS
  Filled 2017-01-05: qty 2

## 2017-01-05 MED ORDER — SODIUM CHLORIDE 0.9 % IV BOLUS (SEPSIS)
1000.0000 mL | Freq: Once | INTRAVENOUS | Status: AC
  Administered 2017-01-05: 1000 mL via INTRAVENOUS

## 2017-01-05 MED ORDER — IOPAMIDOL (ISOVUE-300) INJECTION 61%
100.0000 mL | Freq: Once | INTRAVENOUS | Status: AC | PRN
  Administered 2017-01-05: 100 mL via INTRAVENOUS

## 2017-01-05 MED ORDER — IOPAMIDOL (ISOVUE-300) INJECTION 61%
INTRAVENOUS | Status: AC
  Filled 2017-01-05: qty 100
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
## 2017-01-05 NOTE — ED Notes (Signed)
Bed: [facility]PT1 Expected date:  Expected time:  Means of arrival:  Comments: 

## 2017-01-05 NOTE — ED Notes (Signed)
Patient transported to CT 

## 2017-01-05 NOTE — Discharge Instructions (Signed)
Your CT scan today was normal. I recommend that you follow-up with your primary care doctor. Return to the ED for new or worsening symptoms.
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
## 2017-01-15 IMAGING — CR DG CHEST 1V PORT
1 series · 1 of 1 positions shown · non-contrast
Comparison: None.

CLINICAL DATA: Intubation.

EXAM:
PORTABLE CHEST 1 VIEW

[AP]
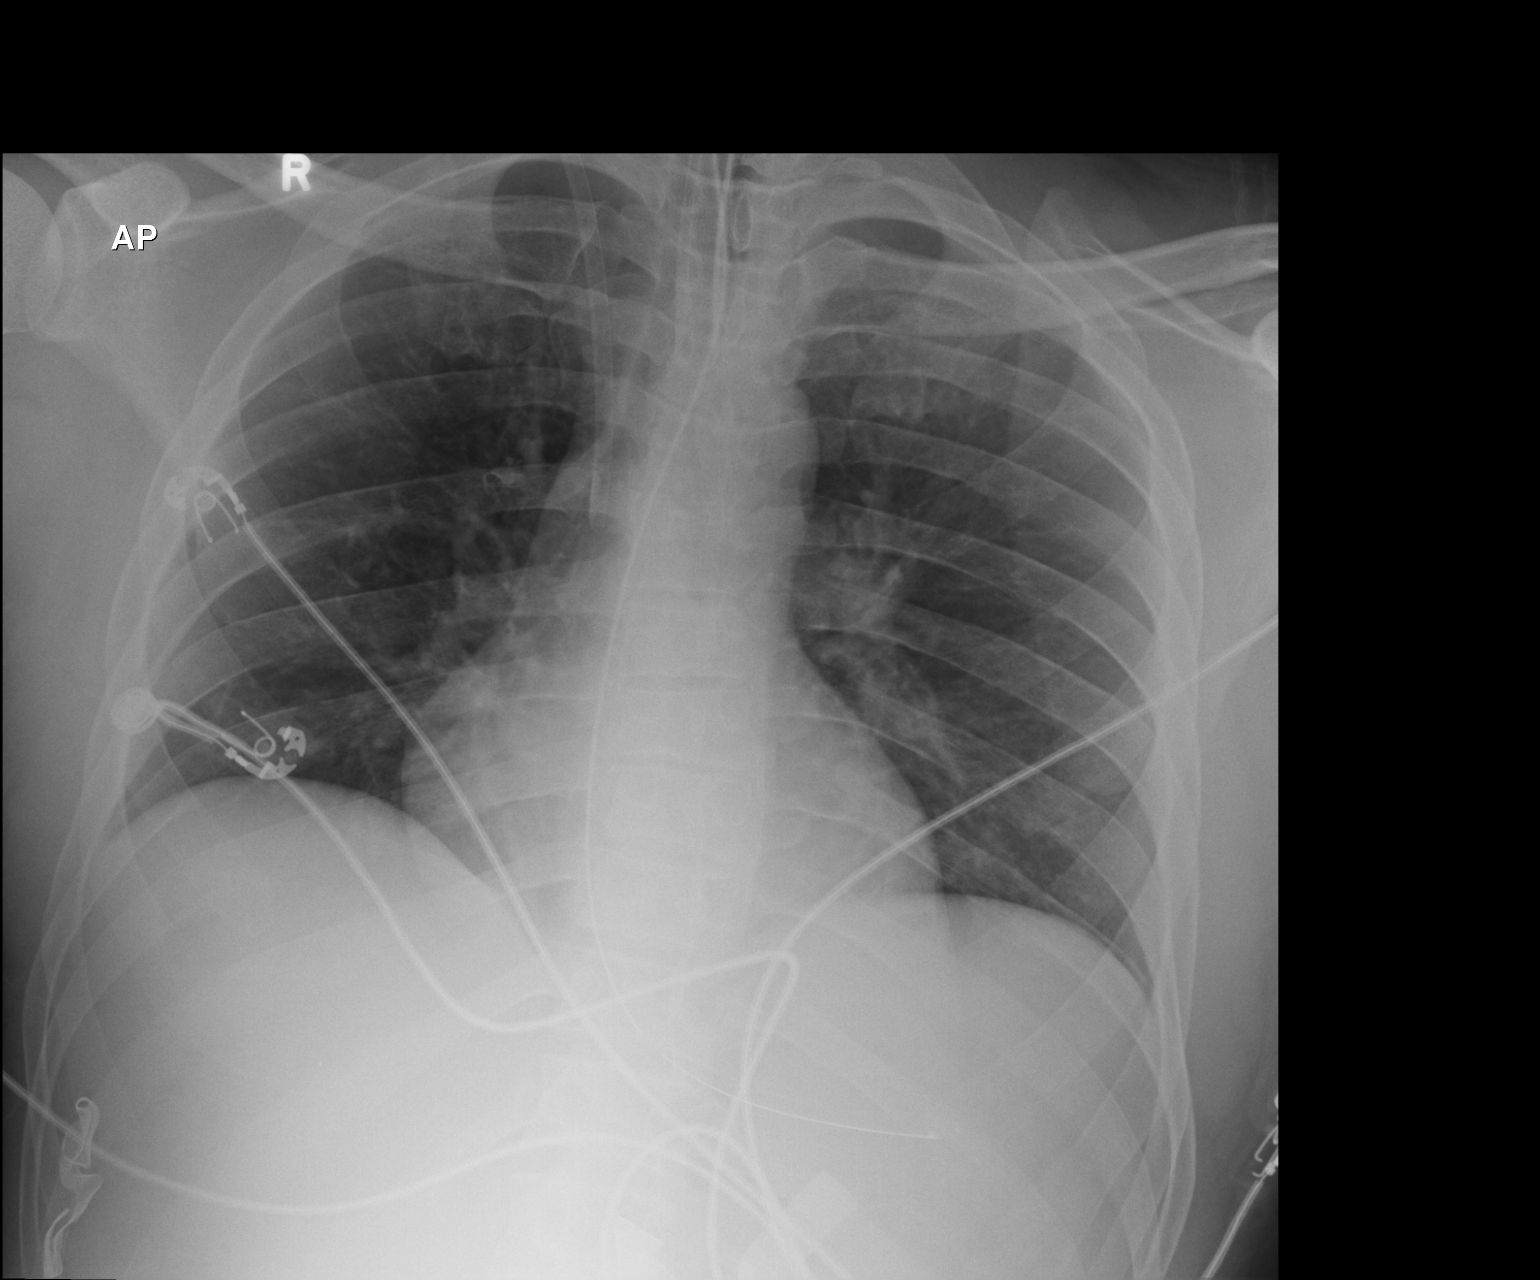

[1 of 1 positions shown; findings below may reference images not displayed]

FINDINGS: Endotracheal tube NG tube right IJ sheath in good anatomic position.
Low lung volumes mild atheromatous. No pleural effusion
pneumothorax. Heart size normal. No acute bony abnormality .
IMPRESSION: 1. Endotracheal tube, NG tube, right IJ sheath in good anatomic
position.
2. Lung volumes with mild bibasilar subsegmental atelectasis.

## 2017-01-17 IMAGING — CR DG CHEST 1V PORT
1 series · 1 of 1 positions shown · non-contrast
Comparison: 05/08/2015

CLINICAL DATA: Respiratory failure.

EXAM:
PORTABLE CHEST 1 VIEW

[AP]
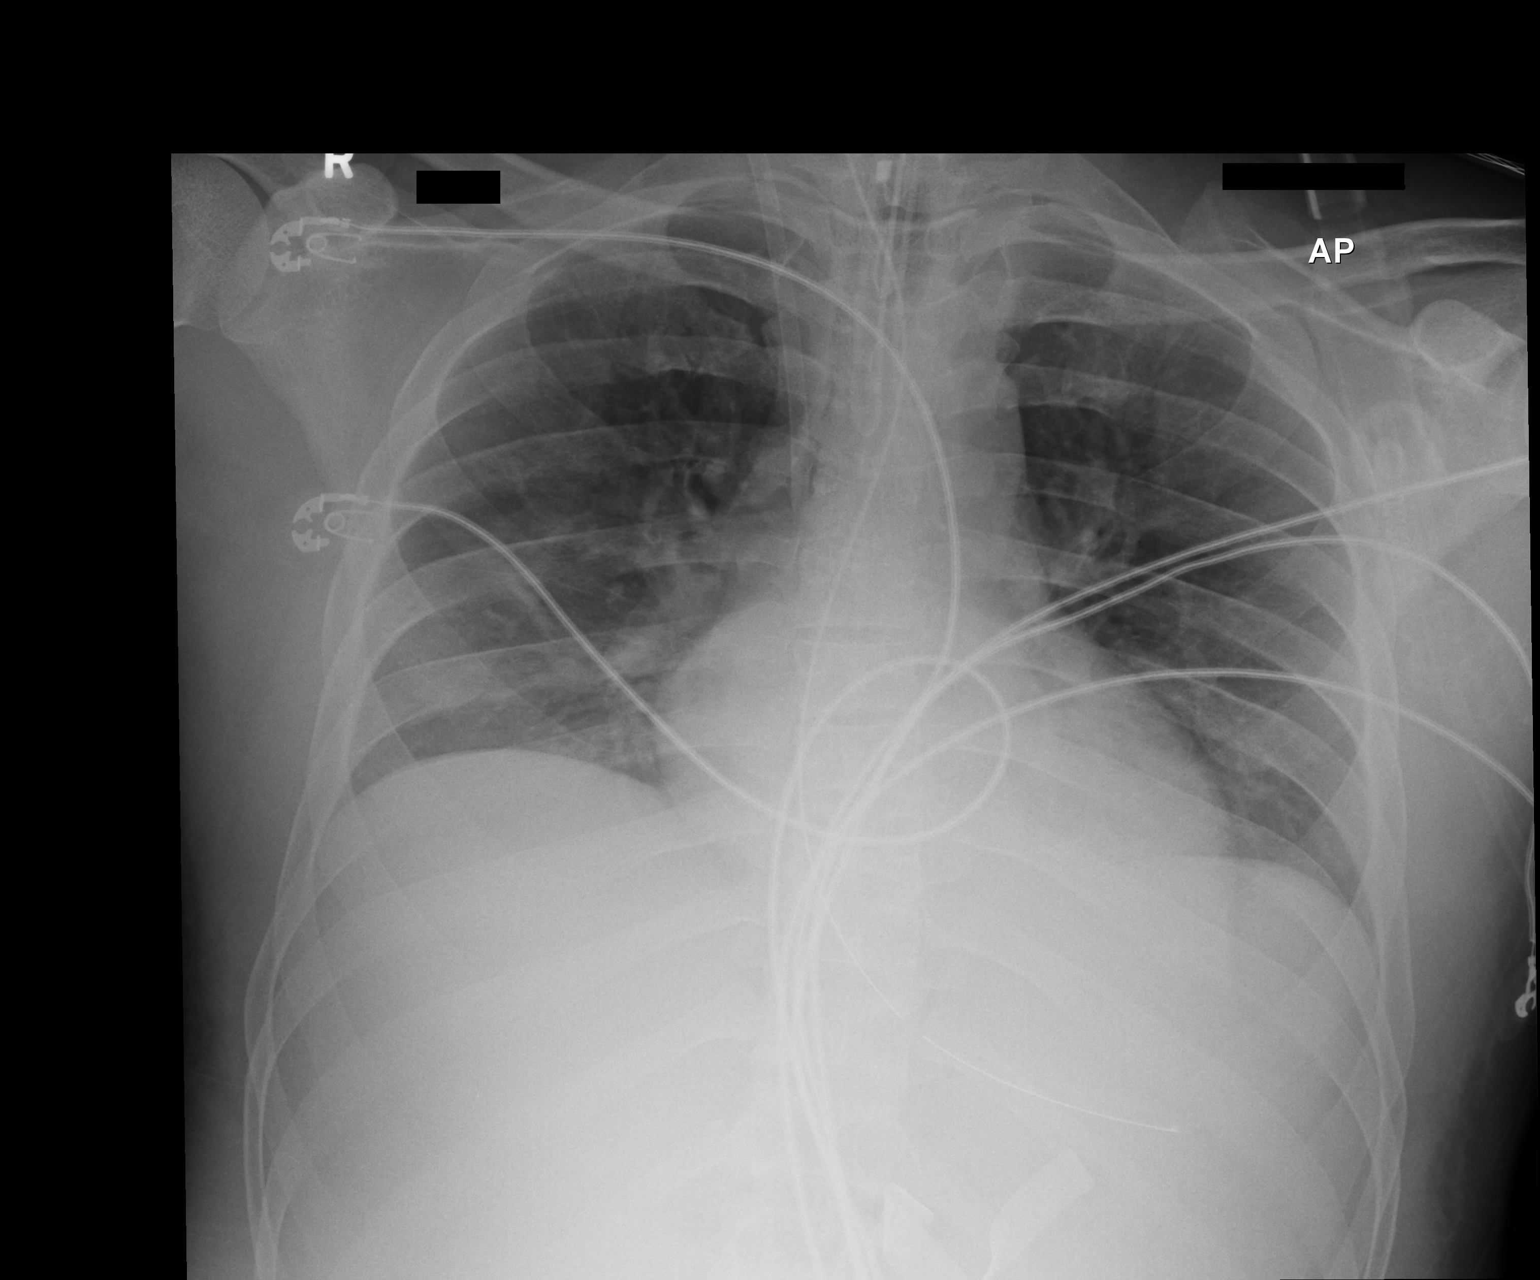

[1 of 1 positions shown; findings below may reference images not displayed]

FINDINGS: Endotracheal tube, right IJ sheath, and enteric tube remain in
place. Cardiomediastinal silhouette is within normal limits. Lung
volumes are slightly diminished compared to the prior study with
minimal bibasilar atelectasis. No pleural effusion or pneumothorax
is identified.
IMPRESSION: Hypoinflation with minimal bibasilar atelectasis.

## 2017-01-19 IMAGING — CR DG CHEST 1V PORT
1 series · 1 of 1 positions shown · non-contrast
Comparison: 05/10/2015 chest radiograph

CLINICAL DATA: Respiratory failure

EXAM:
PORTABLE CHEST 1 VIEW

[AP]
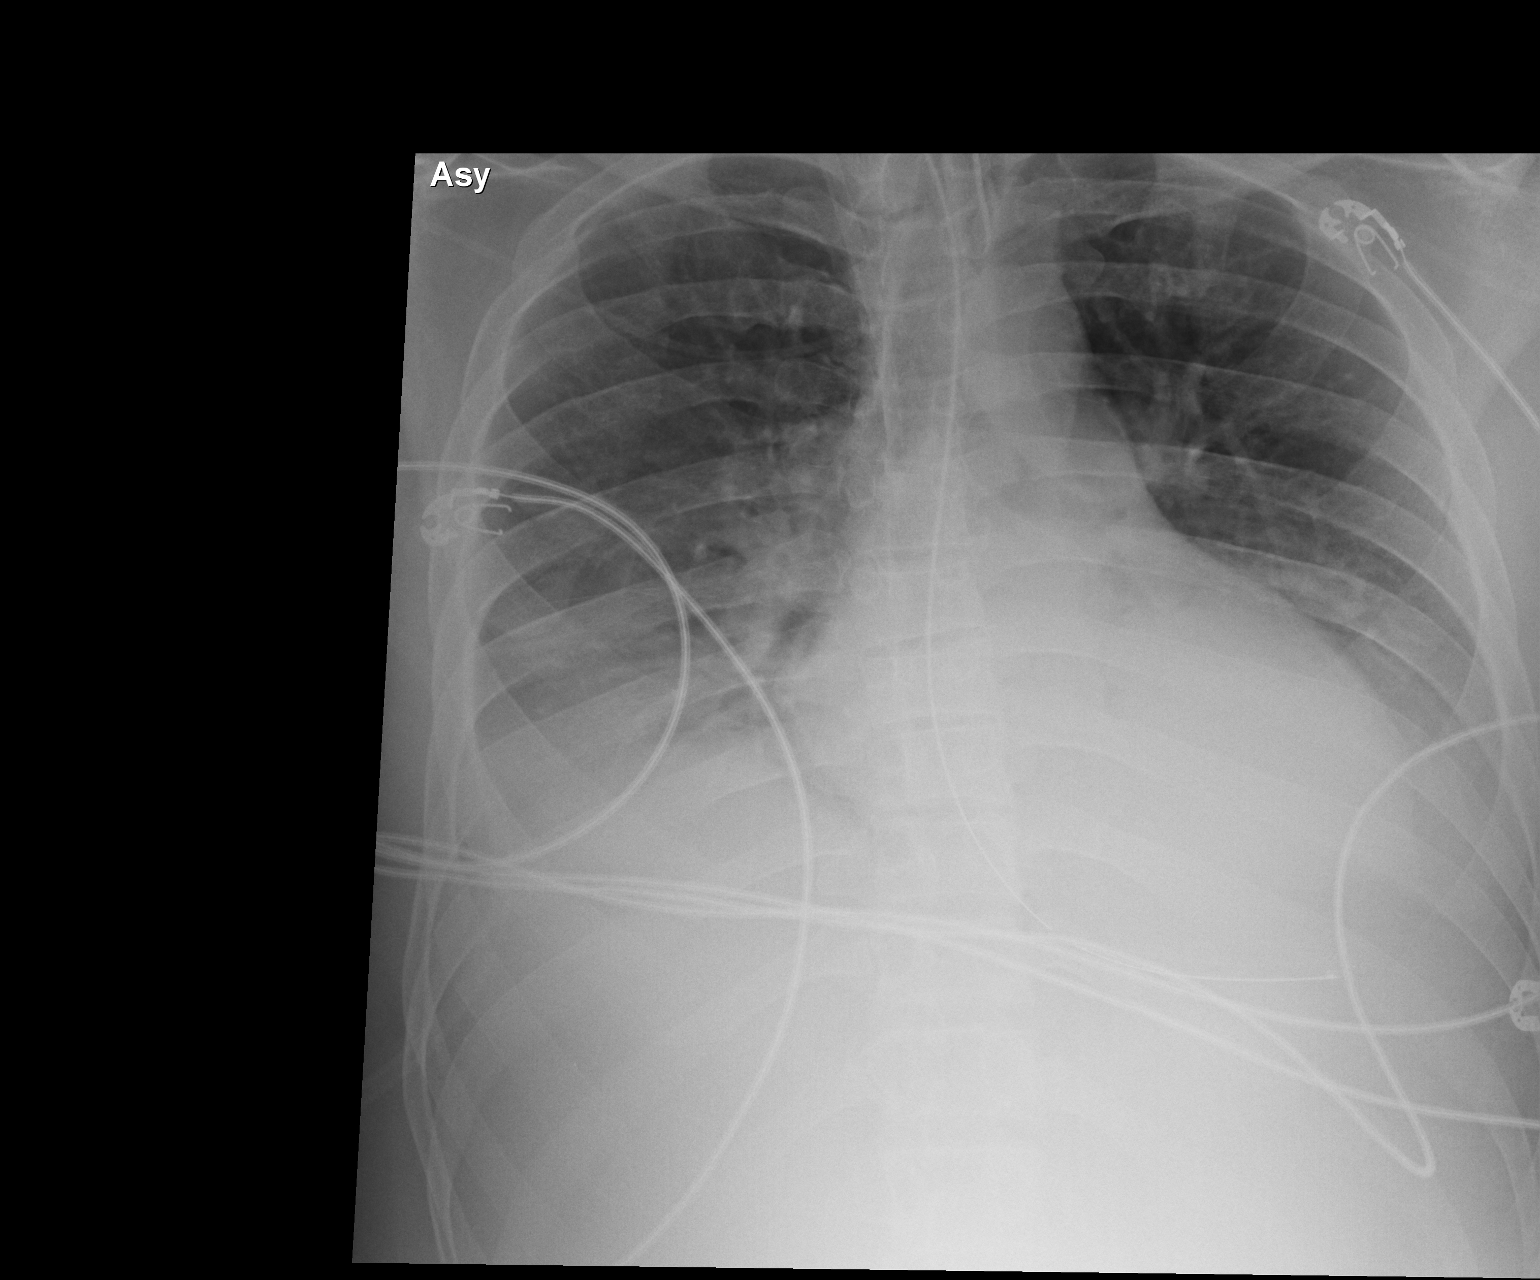

[1 of 1 positions shown; findings below may reference images not displayed]

FINDINGS: Endotracheal tube tip is 4.7 cm above the carina. Right internal
jugular sheath terminates in the middle third of the superior vena
cava. Enteric tube terminates in the proximal stomach. Stable
cardiomediastinal silhouette with top-normal heart size. No
pneumothorax. Increased small bilateral pleural effusions. No overt
pulmonary edema. Worsened patchy bibasilar lung opacities.
IMPRESSION: 1. Well-positioned lines and tubes.
2. Increased small bilateral pleural effusions.
3. Worsened patchy bibasilar lung opacities, likely atelectasis,
cannot exclude pneumonia.

## 2017-01-21 ENCOUNTER — Emergency Department (HOSPITAL_COMMUNITY)
Admission: EM | Admit: 2017-01-21 | Discharge: 2017-01-21 | Disposition: A | Discharge status: Home / Self Care | Payer: PRIVATE HEALTH INSURANCE | Attending: Emergency Medicine | Admitting: Emergency Medicine

## 2017-01-21 ENCOUNTER — Encounter (HOSPITAL_COMMUNITY): Payer: Self-pay | Admitting: Emergency Medicine

## 2017-01-21 DIAGNOSIS — R1031 Right lower quadrant pain: Secondary | ICD-10-CM | POA: Diagnosis not present

## 2017-01-21 DIAGNOSIS — F17210 Nicotine dependence, cigarettes, uncomplicated: Secondary | ICD-10-CM | POA: Insufficient documentation

## 2017-01-21 DIAGNOSIS — K9400 Colostomy complication, unspecified: Secondary | ICD-10-CM | POA: Insufficient documentation

## 2017-01-21 DIAGNOSIS — Z433 Encounter for attention to colostomy: Secondary | ICD-10-CM
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
## 2017-01-21 NOTE — Consult Note (Signed)
WOC consulted for stoma pain.  Talked with patient by phone.  Patient complains of internal pain, stoma pain. When I question the patient about his peristomal skin he reports his skin is intact and not painful. The pain "is like in the stoma".  The common pattern for this patient is to come into the ED when he runs out of supplies.  As I speak to him he reports he is completley out of ostomy pouches with the pouch he currently has on.    [facility] the [facility] 2 3/4" 2pc pouch and wafers, patient reports he has best wear time with this. 3 days which is adequate for patient with ileostomy.  [facility] central supply will only send patient 4 wafers and 4 pouches.  I have explained this to the patient.   I discussed stoma pain with ED physician if further workup needed for this type of pain, not something that [facility] nurse would be able to address.   Supplies to be ordered per bedside nurse for patient use.  Patient has been paying out of pocket for supplies despite my multiple attempts to provide him with indigent services.  I will follow up with [facility] indigent program to see if there is anything additional they may be able to provide as far as supplies go.  Ulyssa Walthour [facility] MSN,RN,CWOCN, CNS, [employer] [phone]

## 2017-01-21 NOTE — ED Notes (Signed)
Patient eating lunch.

## 2017-01-21 NOTE — ED Triage Notes (Signed)
Patient presents today for pain at the clostomy site. Patient reports pain started last night rates 10/10. Patient reports he has had colostomy  for 2 years and reports some bleeding last night. Patient denies any other complaints. Patient alert and orinted.

## 2017-01-21 NOTE — Discharge Instructions (Signed)
Return to the ED with any concerns including vomiting and not able to keep down liquids or your medications, worsening abdominal pain, decreased output from ostomy, fever or chills, and decreased urine output, decreased level of alertness or lethargy, or any other alarming symptoms.

## 2017-01-23 ENCOUNTER — Emergency Department (HOSPITAL_COMMUNITY): Payer: Medicaid Other

## 2017-01-23 ENCOUNTER — Encounter (HOSPITAL_COMMUNITY): Payer: Self-pay | Admitting: Emergency Medicine

## 2017-01-23 ENCOUNTER — Emergency Department (HOSPITAL_COMMUNITY)
Admission: EM | Admit: 2017-01-23 | Discharge: 2017-01-23 | Disposition: A | Discharge status: Home / Self Care | Payer: Medicaid Other | Attending: Emergency Medicine | Admitting: Emergency Medicine

## 2017-01-23 DIAGNOSIS — Z79899 Other long term (current) drug therapy: Secondary | ICD-10-CM | POA: Insufficient documentation

## 2017-01-23 DIAGNOSIS — Z7901 Long term (current) use of anticoagulants: Secondary | ICD-10-CM | POA: Diagnosis not present

## 2017-01-23 DIAGNOSIS — S59912A Unspecified injury of left forearm, initial encounter: Secondary | ICD-10-CM | POA: Insufficient documentation

## 2017-01-23 DIAGNOSIS — F10929 Alcohol use, unspecified with intoxication, unspecified: Secondary | ICD-10-CM | POA: Insufficient documentation

## 2017-01-23 DIAGNOSIS — Z87891 Personal history of nicotine dependence: Secondary | ICD-10-CM | POA: Diagnosis not present

## 2017-01-23 DIAGNOSIS — Y929 Unspecified place or not applicable: Secondary | ICD-10-CM | POA: Diagnosis not present

## 2017-01-23 DIAGNOSIS — Y9389 Activity, other specified: Secondary | ICD-10-CM | POA: Diagnosis not present

## 2017-01-23 DIAGNOSIS — Y999 Unspecified external cause status: Secondary | ICD-10-CM | POA: Diagnosis not present

## 2017-01-23 MED ORDER — DICLOFENAC SODIUM 1 % TD GEL: 4.0000 g | Freq: Four times a day (QID) | TRANSDERMAL | 0 refills | Status: DC

## 2017-01-23 MED ORDER — HALOPERIDOL LACTATE 5 MG/ML IJ SOLN
2.0000 mg | Freq: Once | INTRAMUSCULAR | Status: AC
  Administered 2017-01-23: 2 mg via INTRAMUSCULAR
  Filled 2017-01-23: qty 1

## 2017-01-23 NOTE — ED Notes (Signed)
Patient verbalizes understanding of discharge instructions, prescriptions, home care and follow up care. Patient out of department at this time. 

## 2017-01-23 NOTE — ED Notes (Addendum)
When patient was brought back from x-ray, colostomy bag began to leak more.
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
## 2017-01-29 ENCOUNTER — Inpatient Hospital Stay (HOSPITAL_COMMUNITY)
Admission: EM | Admit: 2017-01-29 | Discharge: 2017-01-31 | DRG: 683 | Disposition: A | Discharge status: Home / Self Care | Payer: Medicaid Other | Attending: Internal Medicine | Admitting: Internal Medicine

## 2017-01-29 ENCOUNTER — Encounter (HOSPITAL_COMMUNITY): Payer: Self-pay | Admitting: Emergency Medicine

## 2017-01-29 DIAGNOSIS — S7401XS Injury of sciatic nerve at hip and thigh level, right leg, sequela: Secondary | ICD-10-CM

## 2017-01-29 DIAGNOSIS — S7400XA Injury of sciatic nerve at hip and thigh level, unspecified leg, initial encounter: Secondary | ICD-10-CM | POA: Diagnosis not present

## 2017-01-29 DIAGNOSIS — G8921 Chronic pain due to trauma: Secondary | ICD-10-CM | POA: Diagnosis present

## 2017-01-29 DIAGNOSIS — G629 Polyneuropathy, unspecified: Secondary | ICD-10-CM | POA: Diagnosis present

## 2017-01-29 DIAGNOSIS — M21371 Foot drop, right foot: Secondary | ICD-10-CM | POA: Diagnosis present

## 2017-01-29 DIAGNOSIS — X58XXXA Exposure to other specified factors, initial encounter: Secondary | ICD-10-CM | POA: Diagnosis not present

## 2017-01-29 DIAGNOSIS — Z87891 Personal history of nicotine dependence: Secondary | ICD-10-CM | POA: Diagnosis not present

## 2017-01-29 DIAGNOSIS — I82509 Chronic embolism and thrombosis of unspecified deep veins of unspecified lower extremity: Secondary | ICD-10-CM | POA: Diagnosis present

## 2017-01-29 DIAGNOSIS — Z79899 Other long term (current) drug therapy: Secondary | ICD-10-CM | POA: Diagnosis not present

## 2017-01-29 DIAGNOSIS — I82511 Chronic embolism and thrombosis of right femoral vein: Secondary | ICD-10-CM | POA: Diagnosis present

## 2017-01-29 DIAGNOSIS — N183 Chronic kidney disease, stage 3 (moderate): Secondary | ICD-10-CM | POA: Diagnosis present

## 2017-01-29 DIAGNOSIS — Z7901 Long term (current) use of anticoagulants: Secondary | ICD-10-CM | POA: Diagnosis not present

## 2017-01-29 DIAGNOSIS — I82531 Chronic embolism and thrombosis of right popliteal vein: Secondary | ICD-10-CM | POA: Diagnosis present

## 2017-01-29 DIAGNOSIS — M792 Neuralgia and neuritis, unspecified: Secondary | ICD-10-CM | POA: Diagnosis present

## 2017-01-29 DIAGNOSIS — M5431 Sciatica, right side: Secondary | ICD-10-CM | POA: Diagnosis present

## 2017-01-29 DIAGNOSIS — Z9114 Patient's other noncompliance with medication regimen: Secondary | ICD-10-CM

## 2017-01-29 DIAGNOSIS — Z91013 Allergy to seafood: Secondary | ICD-10-CM

## 2017-01-29 DIAGNOSIS — M79609 Pain in unspecified limb: Secondary | ICD-10-CM | POA: Diagnosis not present

## 2017-01-29 DIAGNOSIS — W3400XS Accidental discharge from unspecified firearms or gun, sequela: Secondary | ICD-10-CM | POA: Diagnosis not present

## 2017-01-29 DIAGNOSIS — Z932 Ileostomy status: Secondary | ICD-10-CM | POA: Diagnosis not present

## 2017-01-29 DIAGNOSIS — T45516A Underdosing of anticoagulants, initial encounter: Secondary | ICD-10-CM | POA: Diagnosis not present

## 2017-01-29 DIAGNOSIS — Z881 Allergy status to other antibiotic agents status: Secondary | ICD-10-CM | POA: Diagnosis not present

## 2017-01-29 DIAGNOSIS — N179 Acute kidney failure, unspecified: Secondary | ICD-10-CM | POA: Diagnosis present

## 2017-01-29 DIAGNOSIS — Z432 Encounter for attention to ileostomy: Secondary | ICD-10-CM | POA: Diagnosis not present

## 2017-01-29 DIAGNOSIS — E872 Acidosis: Secondary | ICD-10-CM | POA: Diagnosis present

## 2017-01-29 DIAGNOSIS — Z91128 Patient's intentional underdosing of medication regimen for other reason: Secondary | ICD-10-CM | POA: Diagnosis not present

## 2017-01-29 DIAGNOSIS — R197 Diarrhea, unspecified: Secondary | ICD-10-CM | POA: Diagnosis present

## 2017-01-29 DIAGNOSIS — Z86718 Personal history of other venous thrombosis and embolism: Secondary | ICD-10-CM | POA: Diagnosis not present

## 2017-01-29 DIAGNOSIS — E871 Hypo-osmolality and hyponatremia: Secondary | ICD-10-CM | POA: Diagnosis present

## 2017-01-29 DIAGNOSIS — R109 Unspecified abdominal pain: Secondary | ICD-10-CM | POA: Diagnosis present

## 2017-01-29 LAB — URINALYSIS, ROUTINE W REFLEX MICROSCOPIC
Bilirubin Urine: NEGATIVE
Glucose, UA: NEGATIVE mg/dL
Ketones, ur: NEGATIVE mg/dL
Leukocytes, UA: NEGATIVE
Nitrite: NEGATIVE
Protein, ur: 100 mg/dL — AB
Specific Gravity, Urine: 1.021 (ref 1.005–1.030)
pH: 6 (ref 5.0–8.0)

## 2017-01-29 LAB — COMPREHENSIVE METABOLIC PANEL
ALT: 35 U/L (ref 17–63)
AST: 41 U/L (ref 15–41)
Albumin: 4.7 g/dL (ref 3.5–5.0)
Alkaline Phosphatase: 93 U/L (ref 38–126)
Anion gap: 11 (ref 5–15)
BUN: 50 mg/dL — ABNORMAL HIGH (ref 6–20)
CO2: 14 mmol/L — ABNORMAL LOW (ref 22–32)
Calcium: 9.9 mg/dL (ref 8.9–10.3)
Chloride: 101 mmol/L (ref 101–111)
Creatinine, Ser: 2.94 mg/dL — ABNORMAL HIGH (ref 0.61–1.24)
GFR calc Af Amer: 30 mL/min — ABNORMAL LOW (ref >60)
GFR calc non Af Amer: 25 mL/min — ABNORMAL LOW (ref >60)
Glucose, Bld: 96 mg/dL (ref 65–99)
Potassium: 4.3 mmol/L (ref 3.5–5.1)
Sodium: 126 mmol/L — ABNORMAL LOW (ref 135–145)
Total Bilirubin: 0.9 mg/dL (ref 0.3–1.2)
Total Protein: 8.8 g/dL — ABNORMAL HIGH (ref 6.5–8.1)

## 2017-01-29 LAB — CBC
HCT: 44.9 % (ref 39.0–52.0)
Hemoglobin: 15 g/dL (ref 13.0–17.0)
MCH: 31.9 pg (ref 26.0–34.0)
MCHC: 33.4 g/dL (ref 30.0–36.0)
MCV: 95.5 fL (ref 78.0–100.0)
Platelets: 265 10*3/uL (ref 150–400)
RBC: 4.7 MIL/uL (ref 4.22–5.81)
RDW: 13.5 % (ref 11.5–15.5)
WBC: 13.4 10*3/uL — ABNORMAL HIGH (ref 4.0–10.5)

## 2017-01-29 LAB — BASIC METABOLIC PANEL
Anion gap: 10 (ref 5–15)
BUN: 45 mg/dL — ABNORMAL HIGH (ref 6–20)
CO2: 18 mmol/L — ABNORMAL LOW (ref 22–32)
Calcium: 9.1 mg/dL (ref 8.9–10.3)
Chloride: 102 mmol/L (ref 101–111)
Creatinine, Ser: 2.48 mg/dL — ABNORMAL HIGH (ref 0.61–1.24)
GFR calc Af Amer: 36 mL/min — ABNORMAL LOW (ref >60)
GFR calc non Af Amer: 31 mL/min — ABNORMAL LOW (ref >60)
Glucose, Bld: 114 mg/dL — ABNORMAL HIGH (ref 65–99)
Potassium: 3.6 mmol/L (ref 3.5–5.1)
Sodium: 130 mmol/L — ABNORMAL LOW (ref 135–145)

## 2017-01-29 LAB — CREATININE, URINE, RANDOM: Creatinine, Urine: 450.25 mg/dL

## 2017-01-29 LAB — OSMOLALITY: Osmolality: 294 mOsm/kg (ref 275–295)

## 2017-01-29 LAB — OSMOLALITY, URINE: Osmolality, Ur: 647 mOsm/kg (ref 300–900)

## 2017-01-29 LAB — LIPASE, BLOOD: Lipase: 34 U/L (ref 11–51)

## 2017-01-29 LAB — SODIUM, URINE, RANDOM: Sodium, Ur: <10 mmol/L

## 2017-01-29 MED ORDER — SODIUM CHLORIDE 0.9 % IV SOLN
INTRAVENOUS | Status: DC
  Administered 2017-01-29: 12:00:00 via INTRAVENOUS

## 2017-01-29 MED ORDER — TRAMADOL HCL 50 MG PO TABS
50.0000 mg | ORAL_TABLET | Freq: Four times a day (QID) | ORAL | Status: DC | PRN
  Administered 2017-01-29 – 2017-01-30 (×3): 50 mg via ORAL
  Filled 2017-01-29 (×3): qty 1

## 2017-01-29 MED ORDER — SODIUM BICARBONATE 8.4 % IV SOLN
INTRAVENOUS | Status: DC
  Administered 2017-01-29: 17:00:00 via INTRAVENOUS
  Filled 2017-01-29 (×3): qty 150

## 2017-01-29 MED ORDER — ACETAMINOPHEN 650 MG RE SUPP: 650.0000 mg | Freq: Four times a day (QID) | RECTAL | Status: DC | PRN

## 2017-01-29 MED ORDER — DULOXETINE HCL 30 MG PO CPEP
30.0000 mg | ORAL_CAPSULE | Freq: Every day | ORAL | Status: DC
  Administered 2017-01-29 – 2017-01-31 (×3): 30 mg via ORAL
  Filled 2017-01-29 (×3): qty 1

## 2017-01-29 MED ORDER — GABAPENTIN 600 MG PO TABS
600.0000 mg | ORAL_TABLET | Freq: Three times a day (TID) | ORAL | Status: DC
  Administered 2017-01-29 – 2017-01-31 (×6): 600 mg via ORAL
  Filled 2017-01-29 (×6): qty 1

## 2017-01-29 MED ORDER — RIVAROXABAN 20 MG PO TABS
20.0000 mg | ORAL_TABLET | Freq: Every day | ORAL | Status: DC
  Administered 2017-01-29 – 2017-01-30 (×2): 20 mg via ORAL
  Filled 2017-01-29 (×2): qty 1

## 2017-01-29 MED ORDER — ACETAMINOPHEN 325 MG PO TABS
650.0000 mg | ORAL_TABLET | Freq: Four times a day (QID) | ORAL | Status: DC | PRN
  Administered 2017-01-29: 650 mg via ORAL
  Filled 2017-01-29: qty 2

## 2017-01-29 MED ORDER — DEXTROSE 5 % IV SOLN
INTRAVENOUS | Status: DC
  Filled 2017-01-29: qty 1000

## 2017-01-29 MED ORDER — SODIUM BICARBONATE 8.4 % IV SOLN
INTRAVENOUS | Status: DC
  Administered 2017-01-29 – 2017-01-30 (×2): via INTRAVENOUS
  Filled 2017-01-29 (×4): qty 100

## 2017-01-29 MED ORDER — DICYCLOMINE HCL 10 MG PO CAPS
10.0000 mg | ORAL_CAPSULE | Freq: Once | ORAL | Status: AC
  Administered 2017-01-29: 10 mg via ORAL
  Filled 2017-01-29: qty 1

## 2017-01-29 NOTE — ED Notes (Signed)
Breakfast tray ordered 
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
## 2017-01-29 NOTE — Consult Note (Addendum)
[facility] Nurse ostomy [facility]note [facility]; refer to last consult note on 7/3.Pt has a difficult pouching situation and [facility]; he states he missed the appointment since he didn't have a ride. Pt states he is "out of pouchesat this time, since he does not have enough money, and has about a 2 day or less wear time." Stoma type/location: High output ileostomy to right abd is located low and near creases.  Stomal assessment/size: Stoma is flush with the skin level, red and viable, 3/4 inch Current pouch is intact with good seal Output: Large amt liquid green stool Ostomy pouching: Pt prefers to wear a 2pc.system and use paste and a belt.  Education provided: He applies an adhesive tape boarder around the pouching systemin a window-pane fashion and is using powder to crust the skin.  Pt is independent with pouch application and emptying when at home. 5extra 2 3/4 inch two-piece pouches and tapegiven to the patient to use at home. Encouraged pt to seek reversal since he has a difficult pouching situation.  Provided phone number for him to call to be [facility]. This information has been given several previous times and pt has been told to call and make the arrangements. Pt denies further questions at this time.  Please re-consult if further assistance is needed. Thank-you,  Cammie Mcgeeawn Jaloni Sorber MSN, RN, CWOCN, EthanWCN-AP, CNS [phone]

## 2017-01-29 NOTE — Consult Note (Signed)
WOC nurse contacted bedside ED nurse by phone to let her know that I left VM on Mr. Watrous's cell phone with [employer] indigent program information.  He just needs to follow up with them per the instructions I left for re-enrollment for assistance with pouches for next 6 months.  Sonya Gunnoe [facility] MSN,RN,CWOCN, CNS, [employer] [phone]
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
## 2017-01-29 NOTE — ED Notes (Signed)
Pt given sandwich bag and water per Wendy(RN)

## 2017-01-30 ENCOUNTER — Inpatient Hospital Stay (HOSPITAL_COMMUNITY): Payer: Medicaid Other

## 2017-01-30 DIAGNOSIS — M79609 Pain in unspecified limb: Secondary | ICD-10-CM

## 2017-01-30 DIAGNOSIS — Z86718 Personal history of other venous thrombosis and embolism: Secondary | ICD-10-CM

## 2017-01-30 LAB — BASIC METABOLIC PANEL
Anion gap: 8 (ref 5–15)
Anion gap: 8 (ref 5–15)
Anion gap: 8 (ref 5–15)
Anion gap: 5 (ref 5–15)
BUN: 36 mg/dL — ABNORMAL HIGH (ref 6–20)
BUN: 30 mg/dL — ABNORMAL HIGH (ref 6–20)
BUN: 27 mg/dL — ABNORMAL HIGH (ref 6–20)
BUN: 24 mg/dL — ABNORMAL HIGH (ref 6–20)
CO2: 23 mmol/L (ref 22–32)
CO2: 24 mmol/L (ref 22–32)
CO2: 25 mmol/L (ref 22–32)
CO2: 24 mmol/L (ref 22–32)
Calcium: 8.6 mg/dL — ABNORMAL LOW (ref 8.9–10.3)
Calcium: 8.8 mg/dL — ABNORMAL LOW (ref 8.9–10.3)
Calcium: 8.8 mg/dL — ABNORMAL LOW (ref 8.9–10.3)
Calcium: 8.8 mg/dL — ABNORMAL LOW (ref 8.9–10.3)
Chloride: 99 mmol/L — ABNORMAL LOW (ref 101–111)
Chloride: 100 mmol/L — ABNORMAL LOW (ref 101–111)
Chloride: 100 mmol/L — ABNORMAL LOW (ref 101–111)
Chloride: 104 mmol/L (ref 101–111)
Creatinine, Ser: 1.9 mg/dL — ABNORMAL HIGH (ref 0.61–1.24)
Creatinine, Ser: 1.77 mg/dL — ABNORMAL HIGH (ref 0.61–1.24)
Creatinine, Ser: 1.75 mg/dL — ABNORMAL HIGH (ref 0.61–1.24)
Creatinine, Ser: 1.72 mg/dL — ABNORMAL HIGH (ref 0.61–1.24)
GFR calc Af Amer: 50 mL/min — ABNORMAL LOW (ref >60)
GFR calc Af Amer: 55 mL/min — ABNORMAL LOW (ref >60)
GFR calc Af Amer: 55 mL/min — ABNORMAL LOW (ref >60)
GFR calc Af Amer: 56 mL/min — ABNORMAL LOW (ref >60)
GFR calc non Af Amer: 43 mL/min — ABNORMAL LOW (ref >60)
GFR calc non Af Amer: 47 mL/min — ABNORMAL LOW (ref >60)
GFR calc non Af Amer: 48 mL/min — ABNORMAL LOW (ref >60)
GFR calc non Af Amer: 49 mL/min — ABNORMAL LOW (ref >60)
Glucose, Bld: 98 mg/dL (ref 65–99)
Glucose, Bld: 91 mg/dL (ref 65–99)
Glucose, Bld: 120 mg/dL — ABNORMAL HIGH (ref 65–99)
Glucose, Bld: 97 mg/dL (ref 65–99)
Potassium: 3.3 mmol/L — ABNORMAL LOW (ref 3.5–5.1)
Potassium: 3.4 mmol/L — ABNORMAL LOW (ref 3.5–5.1)
Potassium: 3.4 mmol/L — ABNORMAL LOW (ref 3.5–5.1)
Potassium: 3.7 mmol/L (ref 3.5–5.1)
Sodium: 130 mmol/L — ABNORMAL LOW (ref 135–145)
Sodium: 132 mmol/L — ABNORMAL LOW (ref 135–145)
Sodium: 133 mmol/L — ABNORMAL LOW (ref 135–145)
Sodium: 133 mmol/L — ABNORMAL LOW (ref 135–145)

## 2017-01-30 LAB — CBC
HCT: 37.2 % — ABNORMAL LOW (ref 39.0–52.0)
Hemoglobin: 12.5 g/dL — ABNORMAL LOW (ref 13.0–17.0)
MCH: 32.1 pg (ref 26.0–34.0)
MCHC: 33.6 g/dL (ref 30.0–36.0)
MCV: 95.4 fL (ref 78.0–100.0)
Platelets: 246 10*3/uL (ref 150–400)
RBC: 3.9 MIL/uL — ABNORMAL LOW (ref 4.22–5.81)
RDW: 13.7 % (ref 11.5–15.5)
WBC: 12.1 10*3/uL — ABNORMAL HIGH (ref 4.0–10.5)

## 2017-01-30 MED ORDER — DICYCLOMINE HCL 20 MG PO TABS
20.0000 mg | ORAL_TABLET | Freq: Three times a day (TID) | ORAL | Status: DC
  Administered 2017-01-30 – 2017-01-31 (×5): 20 mg via ORAL
  Filled 2017-01-30 (×5): qty 1

## 2017-01-30 MED ORDER — PANTOPRAZOLE SODIUM 40 MG PO TBEC
40.0000 mg | DELAYED_RELEASE_TABLET | Freq: Every day | ORAL | Status: DC
  Administered 2017-01-30 – 2017-01-31 (×2): 40 mg via ORAL
  Filled 2017-01-30 (×2): qty 1

## 2017-01-30 MED ORDER — DEXTROSE-NACL 5-0.45 % IV SOLN
INTRAVENOUS | Status: AC
  Administered 2017-01-30 (×2): via INTRAVENOUS

## 2017-01-30 MED ORDER — POTASSIUM CHLORIDE CRYS ER 20 MEQ PO TBCR
40.0000 meq | EXTENDED_RELEASE_TABLET | Freq: Once | ORAL | Status: AC
  Administered 2017-01-30: 40 meq via ORAL
  Filled 2017-01-30: qty 2

## 2017-01-30 NOTE — Progress Notes (Signed)
   Subjective: [name] was seen laying in his room this morning. He states that he has been having abdominal pain that he describes as a Physiological scientist"Charlie Horse". This morning he denied any nausea or vomiting. He stated that his stool has been more runny than usual, his output has increased, and that he has not noticed any significant changes in color of the stool.  Objective:  Vital signs in last 24 hours: Vitals:   01/29/17 1427 01/29/17 2209 01/30/17 0606 01/30/17 1427  BP: 118/63 108/65 112/74 116/68  Pulse: 68 69 64 63  Resp: (!) 21 20 18 18   Temp: 97.9 F (36.6 C) 98 F (36.7 C) 97.9 F (36.6 C) 97.8 F (36.6 C)  TempSrc: Oral Oral Oral Oral  SpO2: 100% 100% 100% 100%   Physical Exam  Constitutional: He is oriented to person, place, and time and well-developed, well-nourished, and in no distress.  HENT:  Head: Normocephalic and atraumatic.  Cardiovascular: Normal rate, regular rhythm, normal heart sounds and intact distal pulses.   Pulmonary/Chest: Effort normal and breath sounds normal. No respiratory distress. He has no wheezes.  Abdominal: Soft. Bowel sounds are normal. There is no tenderness (no tenderness to palpation).  Ostomy bag was 1/4 of the way full with yellow, soft stool  Neurological: He is alert and oriented to person, place, and time.  Psychiatric: Mood, memory, affect and judgment normal.    Assessment/Plan: Abdominal Pain The patient's abdominal pain was described to be a charley horse type abdominal pain that he has had for the past 1-2 weeks. The pt is afebrile. The patient had mild leukocytosis (wbc=13.4) on admission, his stool is less formed stool, his ostomy output has changed color and increased in quantity all of which can indicate a possible infection.  -consider assessing for c. dificile -Gave dicyclomine 20mg  tid to control spams -Protonix 40mg  qd   AKI with non anion gap metabolic acidosis Cr increased from 1.77 in 01/05/17 to 2.94 on admission  (7/11). The anion gap was normal at 11 and bicarbonate was low at 14 indicative of metabolic acidosis. Pt's cr today (7/12) corrected to 1.75. The aki was likely pre-renal in etiology and due to volume loss.   Hyponatremia:  On admission pt's na=125, serum osmolality was 294, urine cr was 450.25, ur osmo=647, urine na<10. The patient has hypovolemic hyponatremia. The Urine na<10 and FEna<1% is explained by extra renal losses, likely GI from his diarrhea. -The pt's sodium was able to be rapidly corrected within the first 2-3hrs of admission from 126 to 130. We are subsequently trying to decrease by at least 0.5 meq/l/hr. Overall since discharge we have been able to increase by 7 meq since admission.  -Frequent lab draws (q6hrs) with goal of normalizing sodium by tomorrow (7/13) -IVF with dextrose 5%-0.45% nacl infusion at 15025ml/hr  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[facility]) -LE doppler ordered and found the chronic dvt but no acute dvt's -Anticoagulation continued with xarelto 20mg    Sciatic Nerve pain causing right foot drop -Continue Gabapentin 600mg  tid -cymbalta 30mg  qd added  -Tramadol 50mg  q6h prn   Dispo: Anticipated discharge in approximately 1 day.   Lorenso Courierhundi, Rani Sisney, MD 01/30/2017, 5:55 PM Pager: Pager: [phone]

## 2017-01-30 NOTE — Progress Notes (Signed)
*  PRELIMINARY RESULTS* Vascular Ultrasound Right lower extremity venous duplex has been completed.  Preliminary findings: No evidence of acute DVT. Chronic stranding noted in the right common femoral, proximal femoral, and popliteal veins.    Farrel DemarkJill Eunice, RDMS, RVT  01/30/2017, 10:23 AM

## 2017-01-30 NOTE — Progress Notes (Signed)
OT Cancellation Note and Discharge  Patient Details Name: Raymond Bowen MRN: [id number] DOB: 03-28-1976   Cancelled Treatment:    Reason Eval/Treat Not Completed: After consulting with PT, OT screened, no needs identified, will sign off.  Raymond Bowen 01/30/2017, 1:19 PM  Sherryl MangesLaura Lakoda Mcanany OTR/L [phone]
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
## 2017-01-30 NOTE — Care Management Note (Addendum)
Case Management Note  Patient Details  Name: Raymond Bowen MRN: [id number] Date of Birth: [birth year]  Subjective/Objective:                    Action/Plan: CM consulted for assistance with the cost of his medications and for transportation. CM provided him the information and application for [facility].  Pt is already active with [facility]. CM can not assist further with the cost of his medications unless he d/c over the weekend and [facility].  CM provided the patient with 30 day free card for Xarelto and information for calling [name] to see if he qualifies for assistance. CM also encouraged [facility] to [facility] to see what assistance they are able to provide.  Expected Discharge Date:                  Expected Discharge Plan:  Home/Self Care  In-House Referral:     Discharge planning Services  CM Consult  Post Acute Care Choice:    Choice offered to:     DME Arranged:    DME Agency:     HH Arranged:    HH Agency:     Status of Service:  In process, will continue to follow  If discussed at Long Length of Stay Meetings, dates discussed:    Additional Comments:  Raymond BaloKelli F Earlisha Sharples, RN 01/30/2017, 4:24 PM
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
## 2017-01-31 DIAGNOSIS — S7400XA Injury of sciatic nerve at hip and thigh level, unspecified leg, initial encounter: Secondary | ICD-10-CM

## 2017-01-31 DIAGNOSIS — N179 Acute kidney failure, unspecified: Principal | ICD-10-CM

## 2017-01-31 DIAGNOSIS — M792 Neuralgia and neuritis, unspecified: Secondary | ICD-10-CM

## 2017-01-31 DIAGNOSIS — I82531 Chronic embolism and thrombosis of right popliteal vein: Secondary | ICD-10-CM

## 2017-01-31 DIAGNOSIS — M21371 Foot drop, right foot: Secondary | ICD-10-CM

## 2017-01-31 DIAGNOSIS — Z881 Allergy status to other antibiotic agents status: Secondary | ICD-10-CM

## 2017-01-31 DIAGNOSIS — E872 Acidosis: Secondary | ICD-10-CM

## 2017-01-31 DIAGNOSIS — T45516A Underdosing of anticoagulants, initial encounter: Secondary | ICD-10-CM

## 2017-01-31 DIAGNOSIS — Z91128 Patient's intentional underdosing of medication regimen for other reason: Secondary | ICD-10-CM

## 2017-01-31 DIAGNOSIS — X58XXXA Exposure to other specified factors, initial encounter: Secondary | ICD-10-CM

## 2017-01-31 DIAGNOSIS — Z7901 Long term (current) use of anticoagulants: Secondary | ICD-10-CM

## 2017-01-31 DIAGNOSIS — Z932 Ileostomy status: Secondary | ICD-10-CM

## 2017-01-31 DIAGNOSIS — Z91013 Allergy to seafood: Secondary | ICD-10-CM

## 2017-01-31 DIAGNOSIS — I82511 Chronic embolism and thrombosis of right femoral vein: Secondary | ICD-10-CM

## 2017-01-31 DIAGNOSIS — Z79899 Other long term (current) drug therapy: Secondary | ICD-10-CM

## 2017-01-31 LAB — BASIC METABOLIC PANEL
Anion gap: 5 (ref 5–15)
Anion gap: 7 (ref 5–15)
BUN: 22 mg/dL — ABNORMAL HIGH (ref 6–20)
BUN: 20 mg/dL (ref 6–20)
CO2: 22 mmol/L (ref 22–32)
CO2: 19 mmol/L — ABNORMAL LOW (ref 22–32)
Calcium: 8.6 mg/dL — ABNORMAL LOW (ref 8.9–10.3)
Calcium: 8.9 mg/dL (ref 8.9–10.3)
Chloride: 108 mmol/L (ref 101–111)
Chloride: 105 mmol/L (ref 101–111)
Creatinine, Ser: 1.53 mg/dL — ABNORMAL HIGH (ref 0.61–1.24)
Creatinine, Ser: 1.5 mg/dL — ABNORMAL HIGH (ref 0.61–1.24)
GFR calc Af Amer: >60 mL/min (ref >60)
GFR calc Af Amer: >60 mL/min (ref >60)
GFR calc non Af Amer: 56 mL/min — ABNORMAL LOW (ref >60)
GFR calc non Af Amer: 57 mL/min — ABNORMAL LOW (ref >60)
Glucose, Bld: 102 mg/dL — ABNORMAL HIGH (ref 65–99)
Glucose, Bld: 140 mg/dL — ABNORMAL HIGH (ref 65–99)
Potassium: 3.9 mmol/L (ref 3.5–5.1)
Potassium: 4.1 mmol/L (ref 3.5–5.1)
Sodium: 135 mmol/L (ref 135–145)
Sodium: 131 mmol/L — ABNORMAL LOW (ref 135–145)

## 2017-01-31 MED ORDER — DULOXETINE HCL 30 MG PO CPEP: 30.0000 mg | ORAL_CAPSULE | Freq: Every day | ORAL | 0 refills | Status: DC

## 2017-01-31 MED ORDER — CALCIUM POLYCARBOPHIL 625 MG PO TABS: 625.0000 mg | ORAL_TABLET | Freq: Every day | ORAL | 0 refills | Status: DC

## 2017-01-31 MED ORDER — DEXTROSE-NACL 5-0.45 % IV SOLN: INTRAVENOUS | Status: DC

## 2017-01-31 MED ORDER — PANTOPRAZOLE SODIUM 40 MG PO TBEC: 40.0000 mg | DELAYED_RELEASE_TABLET | Freq: Every day | ORAL | 0 refills | Status: DC

## 2017-01-31 MED ORDER — CALCIUM POLYCARBOPHIL 625 MG PO TABS: 625.0000 mg | ORAL_TABLET | Freq: Every day | ORAL | 0 refills | Status: AC

## 2017-01-31 NOTE — Progress Notes (Signed)
   Subjective:  Ms. Raymond Bowen was seen laying in his room this morning doing well and said that he does not have any abdominal pain. He said that he has been feeling extremely tired and that he has continued to have increased output to his ostomy bag.   Objective:  Vital signs in last 24 hours: Vitals:   01/30/17 0606 01/30/17 1427 01/30/17 2230 01/31/17 0523  BP: 112/74 116/68 109/60 97/61  Pulse: 64 63 67 61  Resp: 18 18 17 18   Temp: 97.9 F (36.6 C) 97.8 F (36.6 C) (!) 97.5 F (36.4 C) 98 F (36.7 C)  TempSrc: Oral Oral Oral Oral  SpO2: 100% 100% 100% 100%   Physical Exam  Constitutional: He appears well-developed and well-nourished. No distress.  HENT:  Head: Normocephalic and atraumatic.  Cardiovascular: Normal rate, regular rhythm, normal heart sounds and intact distal pulses.   Pulmonary/Chest: Effort normal and breath sounds normal. No respiratory distress.  Abdominal: Soft. Bowel sounds are normal. He exhibits no distension. There is no tenderness.  Ileostomy bag filled with fluid stool content  Psychiatric: He has a normal mood and affect. His behavior is normal. Judgment and thought content normal.    Assessment/Plan:  Abdominal Pain The patient states that he no longer has abdominal pain but continues to have increased output into his ostomy bag. We considered short bowel syndrome as a possible cause of his increased output and gave ppi to manage this.  -Pantoprazole 40mg  qd prescribed to continue as outpatient -Will order for Vit B12, Vit D, Ferritin at outpatient follow up. -Patient recommended to change his ileostomy bag with nurse assistance as it has not been changed in 5 days -Reminded to call supply company to get ostomy bags.    AKI with non anion gap metabolic acidosis Cr increased from 1.77 in 01/05/17 to 2.94 on admission (7/11). The anion gap was normal at 11 and bicarbonate was low at 14 indicative of metabolic acidosis. The aki was likely pre-renal in  etiology and due to volume loss. Pt's cr on day of discharge was 1.5.  Hyponatremia:  On admission pt's na=125, serum osmolality was 294, urine cr was 450.25, ur osmo=647, urine na<10. The patient has hypovolemic hyponatremia. The Urine na<10 and FEna<1% is explained by extra renal losses, likely GI from his diarrhea. -The pt's sodium was able to be rapidly corrected within the first 2-3hrs of admission from 126 to 130. We are subsequently trying to decrease by at least 0.5 meq/l/hr. Overall since discharge we have been able to increase by 7 meq since admission. Na on day of discharge was 135. 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   Raymond CourierVahini Shaunee Mulkern, MD Internal Medicine PGY1 Pager:[phone] 01/31/2017, 5:30 PM

## 2017-01-31 NOTE — Progress Notes (Addendum)
  Date: 01/31/2017  Patient name: Raymond Bowen  Medical record number: [id number]  Date of birth: 1976/05/23   I have seen and evaluated this patient and I have discussed the plan of care with the house staff. Please see their note for complete details. I concur with their findings with the following additions/corrections: Raymond Bowen was seen on morning rounds. He was walking independently this morning. Today, he states he has not had the charley horse in his abdomen since admission although yesterday he stated he was having it while we were in the room. He continues to endorse increased output from his ostomy. He will need an outpatient workup for malabsorption including a vitamin B12 level, vitamin D level, and ferritin. To try to decrease the ileostomy output, we have started a PPI and have recommended increased fiber intake. If this does not work, he may then try ann antidiarrheal /antimotility agent. We have also encouraged him to stay hydrated while he has the increased output. We have found no etiology for the increased output which is reversible.  He was again encouraged to call the supply company to get ostomy bags. He states his PCPs office is not able to help him with that. He also states his PCPs office is not able to [facility]  His hyponatremia has resolved. 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 This has been present since 2016. When he sees his PCP, he will need urine microscopy to look at the RBCs morphology to determine further workup.  He is stable to be discharged home.   Burns SpainButcher, Elizabeth A, MD 01/31/2017, 2:47 PM

## 2017-01-31 NOTE — Progress Notes (Signed)
Raymond LemmingsBrian K Bowen to be D/C'd  per MD order. Discussed with the patient and all questions fully answered.  VSS, Skin clean, dry and intact without evidence of skin break down, no evidence of skin tears noted.  IV catheter discontinued intact. Site without signs and symptoms of complications. Dressing and pressure applied.  An After Visit Summary was printed and given to the patient. Patient received prescription.  D/c education completed with patient/family including follow up instructions, medication list, d/c activities limitations if indicated, with other d/c instructions as indicated by MD - patient able to verbalize understanding, all questions fully answered.   Patient instructed to return to ED, call 911, or call MD for any changes in condition.   [facility] where he said he will wait for ride in ED waiting room, and D/C home via private auto.

## 2017-01-31 NOTE — Discharge Summary (Signed)
Name: Raymond Bowen MRN: [id number] DOB: 1976-02-14 41 y.o. PCP: Jaclyn Shaggy, MD  Date of Admission: 01/29/2017  8:31 AM Date of Discharge: 02/01/2017 Attending Physician: No att. providers found  Discharge Diagnosis: Hyponatremia  Non-Anion Gap Metabolic Acidosis 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HCC) Ileostomy care (HCC) Foot drop, right Sciatic nerve injury Neuropathic pain Ileostomy in place [facility]) AKI (acute kidney injury) (HCC)   Discharge Medications: Allergies as of 01/31/2017      Reactions   Shellfish Allergy Anaphylaxis, Swelling   Crab legs, seafood    Vancomycin Hives, Rash      Medication List    TAKE these medications   diclofenac sodium 1 % Gel Commonly known as:  VOLTAREN Apply 4 g topically 4 (four) times daily.   DULoxetine 30 MG capsule Commonly known as:  CYMBALTA Take 1 capsule (30 mg total) by mouth daily.   gabapentin 600 MG tablet Commonly known as:  NEURONTIN Take 1 tablet (600 mg total) by mouth 3 (three) times daily.   pantoprazole 40 MG tablet Commonly known as:  PROTONIX Take 1 tablet (40 mg total) by mouth daily.   polycarbophil 625 MG tablet Commonly known as:  FIBERCON Take 1 tablet (625 mg total) by mouth daily.   rivaroxaban 20 MG Tabs tablet Commonly known as:  XARELTO Take 1 tablet (20 mg total) by mouth daily with supper.       Disposition and follow-up:   Mr.Irvine K Heo was discharged from [facility] in good condition.  At the hospital follow up visit please address:  1. Supplies to care for his ileostomy, bmet to follow na level, follow vit b12 ferritin and vit d for possible malabsorption  2.  Labs / imaging needed at time of follow-up: bmet to follow na level  3.  Pending labs/ test needing follow-up: none   Follow-up Appointments:   [facility] 02/21/17   Hospital Course by problem list:  Acute Hyponatremia Abdominal Pain The patient presented with abdominal pain  that was described as a "charley horse" in nature. On admission the patient's na=126, serum osmolality was 294, urine cr was 450.25, ur osmo=647, urine na<10. The patient had hypovolemic hyponatremia. The Urine na<10 and FEna<1% is explained by extra renal losses, likely GI from his diarrhea. The pt's sodium was able to be rapidly corrected within the first 2-3hrs of admission from 126 to 130 with dextrose 5%-0.45% nacl infusion. We are subsequently trying to decrease by at least 0.5 meq/l/hr. We considered short bowel syndrome as a possible cause of his increased output and thus gave pantoprazole and fiber. Would like to get vit b12, vit d, and ferritin to check for possible malabsorption as an outpatient. The patients's sodium on day of discharge corrected and was 135.   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HCC) The patient reported worsening pain in his right leg on admission. He mentioned that he had not been taking xarelto for 3 weeks. LE dopplers were done to check for any presence of new dvts. Doppler showed no evidence for acute dvt, but did show chronic stranding in his right common and proximal femoral and popliteal veins consistent with his chronic dvt. Continued treatment with xarelto per pharmacy dosing.   Ileostomy care [facility]) The patient is often seen in the ed post running out of his ostomy pouches. The patient was given phone number of medical supply company to get ostomy supplies. ED also provided the patient with some extra pouches.  Foot drop, right Sciatic nerve injury Neuropathic pain Patient has longstanding pain for which he takes gabapentin outpatient. Gabapentin was continued along with cymbalta, and tramadol if needed.   AKI (acute kidney injury) (HCC) The patient's cr on admission was 2.9 which increased from 1.77 in 01/05/17. The anion gap was normal at 11 and bicarbonate was low at 14 indicative of metabolic acidosis. The kidney injury was likely to be pre-renal in nature  due to volume loss from increased ostomy output. The patient had also been having decreased po intake. We managed by initiating iv fluids and following bmet. The patient's cr on day of discharge was 1.5.   Discharge Vitals:   BP 97/61 (BP Location: Right Arm)   Pulse 61   Temp 98 F (36.7 C) (Oral)   Resp 18   SpO2 100%   Pertinent Labs, Studies, and Procedures:   Vas US Lower Extremity Venous Right:  - No evidence of acute deep vein thrombosis. Chronic stranding   noted in the right common femoral, proximal femoral, and   popliteal veins. - No evidence of Baker's cyst on the right.   Discharge Instructions: Discharge Instructions    Call MD for:  extreme fatigue    Complete by:  As directed    Call MD for:  persistant dizziness or light-headedness    Complete by:  As directed    Call MD for:  persistant nausea and vomiting    Complete by:  As directed    Call MD for:  temperature >100.4    Complete by:  As directed    Diet - low sodium heart healthy    Complete by:  As directed    Discharge instructions    Complete by:  As directed    Thank you for allowing us to be a part of your care. [facility] and low sodium. Your sodium level returned to normal during your hospitalization with the use of IV fluids. You were given medication to treat your abdominal pain.We also provided you with bags to change your ileostomy bag. Please continue to use all your medication as instructed. Thank you!   Increase activity slowly    Complete by:  As directed       Signed: Lorenso CourierVahini Londin Antone, MD Internal Medicine PGY1 Pager:[phone] 02/01/2017, 10:33 PM

## 2017-01-31 NOTE — Discharge Instructions (Signed)
Information on my medicine - XARELTO® (rivaroxaban) ° °This medication education was reviewed with me or my healthcare representative as part of my discharge preparation.  The pharmacist that spoke with me during my hospital stay was:  Anaija Wissink Dien, RPH ° °WHY WAS XARELTO® PRESCRIBED FOR YOU? °Xarelto® was prescribed to treat blood clots that may have been found in the veins of your legs (deep vein thrombosis) or in your lungs (pulmonary embolism) and to reduce the risk of them occurring again. ° °What do you need to know about Xarelto®? °The dose is one 20 mg tablet taken ONCE A DAY with your evening meal. ° °DO NOT stop taking Xarelto® without talking to the health care provider who prescribed the medication.  Refill your prescription for 20 mg tablets before you run out. ° °After discharge, you should have regular check-up appointments with your healthcare provider that is prescribing your Xarelto®.  In the future your dose may need to be changed if your kidney function changes by a significant amount. ° °What do you do if you miss a dose? °If you are taking Xarelto® TWICE DAILY and you miss a dose, take it as soon as you remember. You may take two 15 mg tablets (total 30 mg) at the same time then resume your regularly scheduled 15 mg twice daily the next day. ° °If you are taking Xarelto® ONCE DAILY and you miss a dose, take it as soon as you remember on the same day then continue your regularly scheduled once daily regimen the next day. Do not take two doses of Xarelto® at the same time.  ° °Important Safety Information °Xarelto® is a blood thinner medicine that can cause bleeding. You should call your healthcare provider right away if you experience any of the following: °? Bleeding from an injury or your nose that does not stop. °? Unusual colored urine (red or dark brown) or unusual colored stools (red or black). °? Unusual bruising for unknown reasons. °? A serious fall or if you hit your head (even if  there is no bleeding). ° °Some medicines may interact with Xarelto® and might increase your risk of bleeding while on Xarelto®. To help avoid this, consult your healthcare provider or pharmacist prior to using any new prescription or non-prescription medications, including herbals, vitamins, non-steroidal anti-inflammatory drugs (NSAIDs) and supplements. ° °This website has more information on Xarelto®: www.xarelto.com. °

## 2017-01-31 NOTE — Care Management Note (Signed)
Case Management Note  Patient Details  Name: Alvis LemmingsBrian K Munn MRN: [id number] Date of Birth: [birth year]  Subjective/Objective:                    Action/Plan: CM asked to have patients [facility] which [facility] with. CM informed patient and he is going to pick them up when he leaves the hospital.  Patient states he has transportation home.   Expected Discharge Date:  01/31/17               Expected Discharge Plan:  Home/Self Care  In-House Referral:     Discharge planning Services  CM Consult, [facility], Other - See comment (transportation assistance)  Post Acute Care Choice:    Choice offered to:     DME Arranged:    DME Agency:     HH Arranged:    HH Agency:     Status of Service:  Completed, signed off  If discussed at [employer] Meetings, dates discussed:    Additional Comments:  Kermit BaloKelli F Jac Romulus, RN 01/31/2017, 3:08 PM

## 2017-02-01 ENCOUNTER — Encounter (HOSPITAL_COMMUNITY): Payer: Self-pay | Admitting: Emergency Medicine

## 2017-02-01 ENCOUNTER — Emergency Department (HOSPITAL_COMMUNITY)
Admission: EM | Admit: 2017-02-01 | Discharge: 2017-02-01 | Disposition: A | Discharge status: Home / Self Care | Payer: Medicaid Other | Attending: Emergency Medicine | Admitting: Emergency Medicine

## 2017-02-01 DIAGNOSIS — Z433 Encounter for attention to colostomy: Secondary | ICD-10-CM | POA: Insufficient documentation

## 2017-02-01 DIAGNOSIS — Z87891 Personal history of nicotine dependence: Secondary | ICD-10-CM | POA: Insufficient documentation

## 2017-02-01 DIAGNOSIS — R109 Unspecified abdominal pain: Secondary | ICD-10-CM | POA: Diagnosis present

## 2017-02-01 DIAGNOSIS — G8929 Other chronic pain: Secondary | ICD-10-CM | POA: Insufficient documentation
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
## 2017-02-01 NOTE — ED Notes (Signed)
Patient colostomy site clean and new bag places on the patient at this time. Pt had skin breakdown around around colostomy area but continue to put tape around the new colostomy bag that was just place regarding of telling him not too.

## 2017-02-01 NOTE — Discharge Instructions (Signed)
You were evaluated in the ED for ostomy care after urostomy bag busted this morning. Unfortunately, we do not have the wound or ostomy nurses in the ED over the weekend however urostomy bag was changed today without complications. Please call the phone number you were given at your last hospital discharge to receive monthly supplies for your ostomy care. Return to the ED for fevers, worsening abdominal pain, nausea, vomiting, changes in ostomy output, redness or warmth around ostomy site.
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
## 2017-02-01 NOTE — ED Notes (Signed)
Colostomy bag change again after pt mess with it and got it leaking again. Bag replace.

## 2017-02-08 ENCOUNTER — Emergency Department (HOSPITAL_COMMUNITY)
Admission: EM | Admit: 2017-02-08 | Discharge: 2017-02-08 | Disposition: A | Discharge status: Home / Self Care | Payer: Medicaid Other | Attending: Emergency Medicine | Admitting: Emergency Medicine

## 2017-02-08 ENCOUNTER — Encounter (HOSPITAL_COMMUNITY): Payer: Self-pay

## 2017-02-08 DIAGNOSIS — Z433 Encounter for attention to colostomy: Secondary | ICD-10-CM | POA: Insufficient documentation

## 2017-02-08 DIAGNOSIS — Z7901 Long term (current) use of anticoagulants: Secondary | ICD-10-CM | POA: Diagnosis not present

## 2017-02-08 DIAGNOSIS — Z91013 Allergy to seafood: Secondary | ICD-10-CM | POA: Insufficient documentation

## 2017-02-08 DIAGNOSIS — Z7189 Other specified counseling: Secondary | ICD-10-CM | POA: Insufficient documentation

## 2017-02-08 DIAGNOSIS — Z79899 Other long term (current) drug therapy: Secondary | ICD-10-CM | POA: Diagnosis not present

## 2017-02-08 NOTE — ED Notes (Signed)
Ostomy cleaned and new wafre and bag reapplied as per pt indiviual practice.skin prep and stoma powder applied by pt. Wafer applied by pt and reinforced with tape by pt.

## 2017-02-08 NOTE — ED Notes (Signed)
Pt home stable after understands instructions.

## 2017-02-08 NOTE — ED Notes (Signed)
Pt given supplies to clean site. Materials again contacted to p[rovide correct supplies.
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
## 2017-02-08 NOTE — ED Triage Notes (Signed)
Pt arrives from home with c/o ostomy bag rupturing and no supplies to fix. Pt c/o burning around stoma.

## 2017-02-08 NOTE — ED Notes (Signed)
Materials management again contacted for correct supplies. Pt aware of contact.

## 2017-02-14 ENCOUNTER — Encounter (HOSPITAL_COMMUNITY): Payer: Self-pay

## 2017-02-14 ENCOUNTER — Emergency Department (HOSPITAL_COMMUNITY)
Admission: EM | Admit: 2017-02-14 | Discharge: 2017-02-14 | Disposition: A | Discharge status: Home / Self Care | Payer: Medicaid Other | Attending: Emergency Medicine | Admitting: Emergency Medicine

## 2017-02-14 DIAGNOSIS — Z87828 Personal history of other (healed) physical injury and trauma: Secondary | ICD-10-CM | POA: Diagnosis not present

## 2017-02-14 DIAGNOSIS — Z433 Encounter for attention to colostomy: Secondary | ICD-10-CM | POA: Insufficient documentation

## 2017-02-14 DIAGNOSIS — Z590 Homelessness: Secondary | ICD-10-CM | POA: Diagnosis not present

## 2017-02-14 DIAGNOSIS — Z87891 Personal history of nicotine dependence: Secondary | ICD-10-CM | POA: Diagnosis not present

## 2017-02-14 NOTE — ED Notes (Signed)
Bed: [id number] Expected date:  Expected time:  Means of arrival:  Comments: 41 yo colostomy bag leaking
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
## 2017-02-14 NOTE — ED Triage Notes (Signed)
Pt presents from home via EMS with c/o colostomy bag leaking. Pt reports he feels a burning sensation at the site. Pt denies any pain. Pt is ambulatory and A & O x 4.

## 2017-02-14 NOTE — ED Notes (Signed)
Ostomy care provided, patient assisted in applying new ostomy bag. A/o x4, denies pain, AVS reviewed with patient and all questions answered.

## 2017-02-14 NOTE — Discharge Instructions (Signed)
Follow up as needed

## 2017-02-17 ENCOUNTER — Encounter (HOSPITAL_COMMUNITY): Payer: Self-pay

## 2017-02-17 ENCOUNTER — Emergency Department (HOSPITAL_COMMUNITY)
Admission: EM | Admit: 2017-02-17 | Discharge: 2017-02-17 | Disposition: A | Discharge status: Home / Self Care | Payer: Medicaid Other | Attending: Emergency Medicine | Admitting: Emergency Medicine

## 2017-02-17 DIAGNOSIS — G8929 Other chronic pain: Secondary | ICD-10-CM | POA: Diagnosis not present

## 2017-02-17 DIAGNOSIS — Z87891 Personal history of nicotine dependence: Secondary | ICD-10-CM | POA: Diagnosis not present

## 2017-02-17 DIAGNOSIS — M79671 Pain in right foot: Secondary | ICD-10-CM | POA: Diagnosis not present

## 2017-02-17 DIAGNOSIS — Z433 Encounter for attention to colostomy: Secondary | ICD-10-CM | POA: Diagnosis not present

## 2017-02-17 DIAGNOSIS — Z7189 Other specified counseling: Secondary | ICD-10-CM | POA: Insufficient documentation

## 2017-02-17 DIAGNOSIS — M545 Low back pain: Secondary | ICD-10-CM | POA: Diagnosis not present

## 2017-02-17 DIAGNOSIS — M79604 Pain in right leg: Secondary | ICD-10-CM | POA: Diagnosis present

## 2017-02-17 DIAGNOSIS — Z79899 Other long term (current) drug therapy: Secondary | ICD-10-CM | POA: Insufficient documentation

## 2017-02-17 LAB — BASIC METABOLIC PANEL
Anion gap: 11 (ref 5–15)
BUN: 22 mg/dL — ABNORMAL HIGH (ref 6–20)
CO2: 17 mmol/L — ABNORMAL LOW (ref 22–32)
Calcium: 9.6 mg/dL (ref 8.9–10.3)
Chloride: 106 mmol/L (ref 101–111)
Creatinine, Ser: 1.84 mg/dL — ABNORMAL HIGH (ref 0.61–1.24)
GFR calc Af Amer: 52 mL/min — ABNORMAL LOW (ref >60)
GFR calc non Af Amer: 45 mL/min — ABNORMAL LOW (ref >60)
Glucose, Bld: 129 mg/dL — ABNORMAL HIGH (ref 65–99)
Potassium: 3.8 mmol/L (ref 3.5–5.1)
Sodium: 134 mmol/L — ABNORMAL LOW (ref 135–145)

## 2017-02-17 LAB — CBC WITH DIFFERENTIAL/PLATELET
Basophils Absolute: 0.1 10*3/uL (ref 0.0–0.1)
Basophils Relative: 1 %
Eosinophils Absolute: 0.4 10*3/uL (ref 0.0–0.7)
Eosinophils Relative: 3 %
HCT: 41 % (ref 39.0–52.0)
Hemoglobin: 14.2 g/dL (ref 13.0–17.0)
Lymphocytes Relative: 12 %
Lymphs Abs: 1.5 10*3/uL (ref 0.7–4.0)
MCH: 33.3 pg (ref 26.0–34.0)
MCHC: 34.6 g/dL (ref 30.0–36.0)
MCV: 96 fL (ref 78.0–100.0)
Monocytes Absolute: 1.2 10*3/uL — ABNORMAL HIGH (ref 0.1–1.0)
Monocytes Relative: 10 %
Neutro Abs: 9.3 10*3/uL — ABNORMAL HIGH (ref 1.7–7.7)
Neutrophils Relative %: 74 %
Platelets: 246 10*3/uL (ref 150–400)
RBC: 4.27 MIL/uL (ref 4.22–5.81)
RDW: 14.4 % (ref 11.5–15.5)
WBC: 12.5 10*3/uL — ABNORMAL HIGH (ref 4.0–10.5)

## 2017-02-17 MED ORDER — SODIUM CHLORIDE 0.9 % IV BOLUS (SEPSIS)
1000.0000 mL | Freq: Once | INTRAVENOUS | Status: AC
  Administered 2017-02-17: 1000 mL via INTRAVENOUS

## 2017-02-17 MED ORDER — METHOCARBAMOL 500 MG PO TABS: 500.0000 mg | ORAL_TABLET | Freq: Two times a day (BID) | ORAL | 0 refills | Status: DC

## 2017-02-17 MED ORDER — ONDANSETRON HCL 4 MG/2ML IJ SOLN
4.0000 mg | Freq: Once | INTRAMUSCULAR | Status: AC
  Administered 2017-02-17: 4 mg via INTRAVENOUS
  Filled 2017-02-17: qty 2

## 2017-02-17 MED ORDER — MORPHINE SULFATE (PF) 4 MG/ML IV SOLN
4.0000 mg | Freq: Once | INTRAVENOUS | Status: AC
  Administered 2017-02-17: 4 mg via INTRAVENOUS
  Filled 2017-02-17: qty 1

## 2017-02-17 NOTE — Discharge Instructions (Signed)
Make sure you go to your appointment on August 3rd.

## 2017-02-17 NOTE — Consult Note (Signed)
WOC contacted by MD from the ED for assistance with patient. Patient well known to this WOC nurse and team. We have assisted him with supplies multiple times and unfortunately he has not followed up with our instructions for renewing his indigent assistance program with [name].  Today I have provided the bedside nurse with the [facility] for ostomy pouch, skin barrier and barrier rings.  [facility] # 2 -ostomy barrier [facility] # [phone] ostomy pouch [place] # [phone] ostomy barrier ring.  I have emailed [employer] to verify that [name] has provided updated information to them for continuation of services but have not yet received response.    Aneta Hendershott [facility] MSN,RN,CWOCN, CNS, [employer] [phone]
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
## 2017-02-17 NOTE — ED Triage Notes (Signed)
Pt reports multiple complaints. He states back pain radiation into his leg. Leg cramping bilaterally as well as his colostomy bag is leaking. Pt states he is waiting for more supplies.

## 2017-02-17 NOTE — Progress Notes (Signed)
CM consulted for evaluation of resources already provided to pt.  Pt has returned to the ED for the 4th time since admission D/C on 01/29/17 for ostomy supplies.  Spoke with pt who states he has filled out the paperwork with the wound care nurse just last week but does not know when he will start receiving supplies again.  Pt also states he has called the [facility] for supplies but they are unable to help him.  Reminded pt to follow up with is appointment scheduled for Aug 3rd.  Pt states he was aware of it and planned to go.  Called Wound Care RN and left VM for return call to verify she is aware of pt and pt needs. Notified Dr. Particia NearingHaviland of information.  No further CM needs noted at this time.

## 2017-02-21 ENCOUNTER — Ambulatory Visit: Payer: Self-pay | Admitting: Family Medicine

## 2017-02-22 ENCOUNTER — Encounter (HOSPITAL_COMMUNITY): Payer: Self-pay | Admitting: Emergency Medicine

## 2017-02-22 ENCOUNTER — Emergency Department (HOSPITAL_COMMUNITY)
Admission: EM | Admit: 2017-02-22 | Discharge: 2017-02-23 | Disposition: A | Discharge status: Home / Self Care | Payer: Medicaid Other | Attending: Emergency Medicine | Admitting: Emergency Medicine

## 2017-02-22 DIAGNOSIS — M79601 Pain in right arm: Secondary | ICD-10-CM | POA: Diagnosis present

## 2017-02-22 DIAGNOSIS — M62838 Other muscle spasm: Secondary | ICD-10-CM | POA: Insufficient documentation

## 2017-02-22 DIAGNOSIS — Z79899 Other long term (current) drug therapy: Secondary | ICD-10-CM | POA: Diagnosis not present

## 2017-02-22 DIAGNOSIS — M79602 Pain in left arm: Secondary | ICD-10-CM | POA: Insufficient documentation

## 2017-02-22 DIAGNOSIS — E860 Dehydration: Secondary | ICD-10-CM

## 2017-02-22 DIAGNOSIS — Z87891 Personal history of nicotine dependence: Secondary | ICD-10-CM | POA: Insufficient documentation
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
## 2017-02-23 LAB — BASIC METABOLIC PANEL
Anion gap: 11 (ref 5–15)
BUN: 32 mg/dL — ABNORMAL HIGH (ref 6–20)
CO2: 15 mmol/L — ABNORMAL LOW (ref 22–32)
Calcium: 9.8 mg/dL (ref 8.9–10.3)
Chloride: 105 mmol/L (ref 101–111)
Creatinine, Ser: 2.04 mg/dL — ABNORMAL HIGH (ref 0.61–1.24)
GFR calc Af Amer: 46 mL/min — ABNORMAL LOW (ref >60)
GFR calc non Af Amer: 40 mL/min — ABNORMAL LOW (ref >60)
Glucose, Bld: 92 mg/dL (ref 65–99)
Potassium: 4 mmol/L (ref 3.5–5.1)
Sodium: 131 mmol/L — ABNORMAL LOW (ref 135–145)

## 2017-02-23 LAB — CBC WITH DIFFERENTIAL/PLATELET
Basophils Absolute: 0 10*3/uL (ref 0.0–0.1)
Basophils Relative: 0 %
Eosinophils Absolute: 0.4 10*3/uL (ref 0.0–0.7)
Eosinophils Relative: 3 %
HCT: 41.6 % (ref 39.0–52.0)
Hemoglobin: 14.5 g/dL (ref 13.0–17.0)
Lymphocytes Relative: 21 %
Lymphs Abs: 2.9 10*3/uL (ref 0.7–4.0)
MCH: 32.7 pg (ref 26.0–34.0)
MCHC: 34.9 g/dL (ref 30.0–36.0)
MCV: 93.9 fL (ref 78.0–100.0)
Monocytes Absolute: 1.3 10*3/uL — ABNORMAL HIGH (ref 0.1–1.0)
Monocytes Relative: 10 %
Neutro Abs: 9.2 10*3/uL — ABNORMAL HIGH (ref 1.7–7.7)
Neutrophils Relative %: 66 %
Platelets: 251 10*3/uL (ref 150–400)
RBC: 4.43 MIL/uL (ref 4.22–5.81)
RDW: 14.1 % (ref 11.5–15.5)
WBC: 13.9 10*3/uL — ABNORMAL HIGH (ref 4.0–10.5)

## 2017-02-23 LAB — CK: Total CK: 636 U/L — ABNORMAL HIGH (ref 49–397)

## 2017-02-23 MED ORDER — SODIUM CHLORIDE 0.9 % IV BOLUS (SEPSIS)
1000.0000 mL | Freq: Once | INTRAVENOUS | Status: AC
  Administered 2017-02-23: 1000 mL via INTRAVENOUS

## 2017-02-23 MED ORDER — HYDROMORPHONE HCL 1 MG/ML IJ SOLN
1.0000 mg | Freq: Once | INTRAMUSCULAR | Status: AC
  Administered 2017-02-23: 1 mg via INTRAVENOUS
  Filled 2017-02-23: qty 1

## 2017-02-23 MED ORDER — ONDANSETRON HCL 4 MG/2ML IJ SOLN
4.0000 mg | Freq: Once | INTRAMUSCULAR | Status: AC
  Administered 2017-02-23: 4 mg via INTRAVENOUS
  Filled 2017-02-23: qty 2
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
## 2017-02-23 NOTE — ED Notes (Signed)
Pt given Mal[place]key sandwich, coke, and canister to empty ostomy

## 2017-02-24 ENCOUNTER — Telehealth: Payer: Self-pay

## 2017-02-24 ENCOUNTER — Emergency Department (HOSPITAL_COMMUNITY)
Admission: EM | Admit: 2017-02-24 | Discharge: 2017-02-24 | Disposition: A | Discharge status: Home / Self Care | Payer: Medicaid Other | Attending: Emergency Medicine | Admitting: Emergency Medicine

## 2017-02-24 ENCOUNTER — Telehealth: Payer: Self-pay | Admitting: Emergency Medicine

## 2017-02-24 DIAGNOSIS — Z432 Encounter for attention to ileostomy: Secondary | ICD-10-CM | POA: Diagnosis present

## 2017-02-24 DIAGNOSIS — Z87891 Personal history of nicotine dependence: Secondary | ICD-10-CM | POA: Diagnosis not present

## 2017-02-24 NOTE — Progress Notes (Signed)
CSW provided patient with [facility] to the [facility] and additional resources for the community.   Stacy GardnerErin Kani Chauvin, [facility] Hospital[facility]WA Emergency Room Clinical Social Worker [phone]

## 2017-02-24 NOTE — Telephone Encounter (Signed)
This CM spoke to Andria Meuse, RN CM in ED who met with the patient. He has reported that he did not have transportation to his appointment at [facility] last week, so he was a no show  Explained that if he has the [employer], he is eligible for bus passes to medical appointments. If he is homeless, he can use the [facility] address.   As Levada Dy explained, he stated that he is homeless but would not provide a location where he plans to stay. He also explained that he provided the required information to [facility] to obtain his ostomy supplies. He can provide the address of the [facility] if needed to receive mail.   This CM explained that he can put medications on account at [facility] but will be asked to make a payment, even a very small payment, when his bill reaches $100.  A possible option for a referral for community support is [facility] if the uninsured program is available for him and he is in agreement.

## 2017-02-24 NOTE — Progress Notes (Signed)
CM consulted due to pt's return to the ED for ostomy supplies,a total of 13 visits in the last 6 months and other unmet medical needs. CM spoke at length with pt about the [facility]'s resources and his continued missed appointments, including one from last Friday 02/21/17.  Pt reports he has no phone to call and cancel or change his appointment times and he has no transportation or family/friends who can help.  Pt additionally reports that he could have his ostomy reversed but he is [facility] to get [facility].  CM consulted with [facility] CM and pt, together pt made the decision to follow up with Raymond [facility]ellingarla [facility]  Pt has agreed to go straight there on D/C.  Pt given bus pass. CSW consulted for homeless needs. CM will follow up with pt if he returns to the ED for further needs.

## 2017-02-24 NOTE — Telephone Encounter (Signed)
CM received a return call from [place], [facility].  Updated her with pt's info, per request of pt, and let her know pt was [facility].  Albin FellingCarla will be expecting the pt on arrival.
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
## 2017-02-24 NOTE — ED Notes (Signed)
Bed: WTR9 Expected date:  Expected time:  Means of arrival:  Comments: 

## 2017-02-24 NOTE — Telephone Encounter (Signed)
Albin Fellingarla, CM with [facility] called.  [facility] up [facility].  She had further questions about the situation, Mr Laureen OchsCurrie provided ok again to speak with her about his situation.  Advised pt does not have have insurance and he did not follow up with the [facility] for the orange card after being given the paperwork.

## 2017-02-24 NOTE — Discharge Instructions (Signed)
[facility]  You need to be there before 3pm.

## 2017-02-24 NOTE — Telephone Encounter (Signed)
Call placed to Derrek MonacoAngela Wheeler, RN CM to inform her that as per Theadora Ramaeisy Botello, [facility] Financial Counselor, the patient has not submitted any information for [employer] application.  As per Marylene LandAngela, the patient is being instructed to [facility] to inquire about housing and supportive services.

## 2017-02-24 NOTE — ED Triage Notes (Signed)
Pt c/o leaking colostomy bag, no other complaints.

## 2017-02-25 ENCOUNTER — Emergency Department (HOSPITAL_COMMUNITY)
Admission: EM | Admit: 2017-02-25 | Discharge: 2017-02-25 | Disposition: A | Discharge status: Home / Self Care | Payer: Medicaid Other | Attending: Emergency Medicine | Admitting: Emergency Medicine

## 2017-02-25 ENCOUNTER — Encounter (HOSPITAL_COMMUNITY): Payer: Self-pay | Admitting: Emergency Medicine

## 2017-02-25 DIAGNOSIS — Z932 Ileostomy status: Secondary | ICD-10-CM | POA: Insufficient documentation

## 2017-02-25 DIAGNOSIS — Z87891 Personal history of nicotine dependence: Secondary | ICD-10-CM | POA: Diagnosis not present

## 2017-02-25 DIAGNOSIS — R1031 Right lower quadrant pain: Secondary | ICD-10-CM | POA: Insufficient documentation

## 2017-02-25 LAB — CBC
HCT: 39.1 % (ref 39.0–52.0)
Hemoglobin: 13.2 g/dL (ref 13.0–17.0)
MCH: 32.6 pg (ref 26.0–34.0)
MCHC: 33.8 g/dL (ref 30.0–36.0)
MCV: 96.5 fL (ref 78.0–100.0)
Platelets: 238 10*3/uL (ref 150–400)
RBC: 4.05 MIL/uL — ABNORMAL LOW (ref 4.22–5.81)
RDW: 14.4 % (ref 11.5–15.5)
WBC: 11 10*3/uL — ABNORMAL HIGH (ref 4.0–10.5)

## 2017-02-25 LAB — COMPREHENSIVE METABOLIC PANEL
ALT: 38 U/L (ref 17–63)
AST: 41 U/L (ref 15–41)
Albumin: 4 g/dL (ref 3.5–5.0)
Alkaline Phosphatase: 81 U/L (ref 38–126)
Anion gap: 8 (ref 5–15)
BUN: 20 mg/dL (ref 6–20)
CO2: 18 mmol/L — ABNORMAL LOW (ref 22–32)
Calcium: 9.6 mg/dL (ref 8.9–10.3)
Chloride: 110 mmol/L (ref 101–111)
Creatinine, Ser: 1.41 mg/dL — ABNORMAL HIGH (ref 0.61–1.24)
GFR calc Af Amer: >60 mL/min (ref >60)
GFR calc non Af Amer: >60 mL/min (ref >60)
Glucose, Bld: 92 mg/dL (ref 65–99)
Potassium: 3.8 mmol/L (ref 3.5–5.1)
Sodium: 136 mmol/L (ref 135–145)
Total Bilirubin: 0.5 mg/dL (ref 0.3–1.2)
Total Protein: 7.8 g/dL (ref 6.5–8.1)

## 2017-02-25 LAB — LIPASE, BLOOD: Lipase: 31 U/L (ref 11–51)
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
## 2017-02-25 NOTE — ED Triage Notes (Signed)
Pt reports pain at site of colostomy that began last night. Denies vomiting or fevers.

## 2017-02-25 NOTE — ED Notes (Signed)
Patient states his colostomy bag is leaking , states [facility] and was given a new bag. Here today wants bag checked. States he would like to see a wound nurse.

## 2017-02-25 NOTE — ED Notes (Signed)
Wound care nurse returned call will see patient , ginger ale given to drink.

## 2017-02-25 NOTE — Discharge Instructions (Signed)
You were seen in the ED today with abdominal pain. The wound care nurse has arranged for supplies to be delivered to your house.

## 2017-02-25 NOTE — Consult Note (Addendum)
WOC consulted for ostomy patient, familiar to the team. Multiple ED visits related to supply issues. Today I have provided him with 4 ostomy skin barriers, 4 ostomy pouchs, ostomy powder for skin irritation, hypafix tape (per his request) and ostomy barrier rings.  When I discuss with him the need to change the pouch today, he reports "it doesn't need to be changed today, but I'm out of supplies".  I have contacted Raymond Bowen 3 times specifically when he has not been in an ED to request that he contact [employer](ostomy manufacturer) to renew his patient assistance program information which must be updated every 6 months and he is in the last 6 months of coverage for supplies.  I received an email this am upon my request to [facility] that verified he has updated his information with them but they have not been able to reach him to arrange delivery for supplies.  It is not clear to me if he is homeless right now or not, he reports he does not have a phone.  I explained to him that the company can not ship expensive ostomy supplies without verifying that someone will be accountable to receive the supplies.  After further discussion he offered his sister's address, which I have placed in a sticky note under treatment notes, and his sister's phone number.  [facility] and they have responded that they will reach out to his sister Raymond Bowen to arrange for delivery of supplies.   Unfortunately, unless Raymond Bowen is able to get an appointment with a surgeon to perform a takedown of his ostomy this will continue to be a huge issue for him. Most ostomy suppliers will only provide about 6 months to a year of free indigent supplies before they will deny him further   Raymond Bowen [facility] MSN,RN,CWOCN, CNS, CWON-AP [phone]

## 2017-02-25 NOTE — ED Notes (Signed)
Pt ambulated to front desk to inquire about his wait, delay explained to patient. Pt very understanding and ambulated back into within area. Pt in no distress at this time.

## 2017-03-07 ENCOUNTER — Emergency Department (HOSPITAL_COMMUNITY)
Admission: EM | Admit: 2017-03-07 | Discharge: 2017-03-07 | Disposition: A | Discharge status: Home / Self Care | Payer: PRIVATE HEALTH INSURANCE | Attending: Emergency Medicine | Admitting: Emergency Medicine

## 2017-03-07 ENCOUNTER — Encounter (HOSPITAL_COMMUNITY): Payer: Self-pay | Admitting: Emergency Medicine

## 2017-03-07 DIAGNOSIS — M79671 Pain in right foot: Secondary | ICD-10-CM | POA: Diagnosis present

## 2017-03-07 DIAGNOSIS — Z87891 Personal history of nicotine dependence: Secondary | ICD-10-CM | POA: Diagnosis not present

## 2017-03-07 NOTE — ED Triage Notes (Signed)
Patient reports chronic right foot pain worse this week unrelieved by prescription Gabapentin , denies injury / ambulatory .

## 2017-03-07 NOTE — Discharge Instructions (Signed)
Can increase your gabapentin to 900mg  per dose (1.5 tablets). Follow-up with the wellness clinic as scheduled on the 28th. Return here for new concerns.
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
## 2017-03-18 ENCOUNTER — Encounter: Payer: Self-pay | Admitting: Family Medicine

## 2017-03-18 ENCOUNTER — Ambulatory Visit: Payer: No Typology Code available for payment source | Attending: Family Medicine | Admitting: Family Medicine

## 2017-03-18 VITALS — BP 117/71 | HR 66 | Temp 97.6°F | Ht 68.0 in | Wt 188.4 lb

## 2017-03-18 DIAGNOSIS — M21371 Foot drop, right foot: Secondary | ICD-10-CM

## 2017-03-18 DIAGNOSIS — M792 Neuralgia and neuritis, unspecified: Secondary | ICD-10-CM

## 2017-03-18 DIAGNOSIS — Z9889 Other specified postprocedural states: Secondary | ICD-10-CM | POA: Insufficient documentation

## 2017-03-18 DIAGNOSIS — I82511 Chronic embolism and thrombosis of right femoral vein: Secondary | ICD-10-CM | POA: Insufficient documentation

## 2017-03-18 DIAGNOSIS — Z933 Colostomy status: Secondary | ICD-10-CM | POA: Insufficient documentation

## 2017-03-18 DIAGNOSIS — Z79899 Other long term (current) drug therapy: Secondary | ICD-10-CM | POA: Diagnosis not present

## 2017-03-18 DIAGNOSIS — Z932 Ileostomy status: Secondary | ICD-10-CM | POA: Diagnosis not present

## 2017-03-18 DIAGNOSIS — M79604 Pain in right leg: Secondary | ICD-10-CM | POA: Insufficient documentation

## 2017-03-18 MED ORDER — DULOXETINE HCL 60 MG PO CPEP: 60.0000 mg | ORAL_CAPSULE | Freq: Every day | ORAL | 3 refills | Status: DC

## 2017-03-18 MED ORDER — GABAPENTIN 600 MG PO TABS: 600.0000 mg | ORAL_TABLET | Freq: Three times a day (TID) | ORAL | 3 refills | Status: DC

## 2017-03-18 MED ORDER — RIVAROXABAN 20 MG PO TABS: 20.0000 mg | ORAL_TABLET | Freq: Every day | ORAL | 3 refills | Status: DC
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
## 2017-03-19 ENCOUNTER — Encounter: Payer: Self-pay | Admitting: Family Medicine

## 2017-03-19 ENCOUNTER — Telehealth: Payer: Self-pay

## 2017-03-19 NOTE — Telephone Encounter (Signed)
Pt was called and informed that paperwork is to be filled out by him.

## 2017-04-10 ENCOUNTER — Encounter (HOSPITAL_COMMUNITY): Payer: Self-pay | Admitting: Emergency Medicine

## 2017-04-10 ENCOUNTER — Emergency Department (HOSPITAL_COMMUNITY)
Admission: EM | Admit: 2017-04-10 | Discharge: 2017-04-10 | Disposition: A | Discharge status: Home / Self Care | Payer: PRIVATE HEALTH INSURANCE | Attending: Emergency Medicine | Admitting: Emergency Medicine

## 2017-04-10 DIAGNOSIS — Z79899 Other long term (current) drug therapy: Secondary | ICD-10-CM | POA: Diagnosis not present

## 2017-04-10 DIAGNOSIS — Z7901 Long term (current) use of anticoagulants: Secondary | ICD-10-CM | POA: Insufficient documentation

## 2017-04-10 DIAGNOSIS — K9400 Colostomy complication, unspecified: Secondary | ICD-10-CM | POA: Insufficient documentation

## 2017-04-10 DIAGNOSIS — Z87891 Personal history of nicotine dependence: Secondary | ICD-10-CM | POA: Diagnosis not present

## 2017-04-10 NOTE — ED Triage Notes (Signed)
Pt requesting colostomy bag refill; denies other.
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
## 2017-04-10 NOTE — Care Management Note (Signed)
Case Management Note  CM noted pt's return to the ED for ostomy supplies.  Spoke with pt and advised him he needed to choose between the [facility], NP and the [facility] but that he could not use both after noting he'd seen both recently.  Pt states since he was at the [facility] last, he made a financial counseling appointment; they are working for him to get he orange card and he has the papers for [employer] transportation which will be able to get him to his appointment at [facility] for ostomy reversal.  Updated Keenan Bachelor, [place]. No further CM needs noted at this time.

## 2017-04-13 ENCOUNTER — Encounter (HOSPITAL_COMMUNITY): Payer: Self-pay | Admitting: Emergency Medicine

## 2017-04-13 ENCOUNTER — Emergency Department (HOSPITAL_COMMUNITY)
Admission: EM | Admit: 2017-04-13 | Discharge: 2017-04-13 | Disposition: A | Discharge status: Home / Self Care | Payer: Medicaid Other | Attending: Emergency Medicine | Admitting: Emergency Medicine

## 2017-04-13 DIAGNOSIS — Z7901 Long term (current) use of anticoagulants: Secondary | ICD-10-CM | POA: Insufficient documentation

## 2017-04-13 DIAGNOSIS — Z87891 Personal history of nicotine dependence: Secondary | ICD-10-CM | POA: Insufficient documentation

## 2017-04-13 DIAGNOSIS — M79671 Pain in right foot: Secondary | ICD-10-CM | POA: Diagnosis not present

## 2017-04-13 DIAGNOSIS — K9403 Colostomy malfunction: Secondary | ICD-10-CM | POA: Diagnosis not present

## 2017-04-13 DIAGNOSIS — Z79899 Other long term (current) drug therapy: Secondary | ICD-10-CM | POA: Diagnosis not present

## 2017-04-13 DIAGNOSIS — K9400 Colostomy complication, unspecified: Secondary | ICD-10-CM

## 2017-04-13 MED ORDER — IBUPROFEN 800 MG PO TABS
800.0000 mg | ORAL_TABLET | Freq: Once | ORAL | Status: AC
  Administered 2017-04-13: 800 mg via ORAL
  Filled 2017-04-13: qty 1

## 2017-04-13 MED ORDER — ACETAMINOPHEN 500 MG PO TABS
1000.0000 mg | ORAL_TABLET | Freq: Once | ORAL | Status: AC
  Administered 2017-04-13: 1000 mg via ORAL
  Filled 2017-04-13: qty 2

## 2017-04-13 MED ORDER — OXYCODONE HCL 5 MG PO TABS
5.0000 mg | ORAL_TABLET | Freq: Once | ORAL | Status: AC
  Administered 2017-04-13: 5 mg via ORAL
  Filled 2017-04-13: qty 1

## 2017-04-13 NOTE — ED Notes (Addendum)
Pt states he changes his colostomy bag at home. Gave pt all items needed and asked him to call if he needed assistance.
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
## 2017-04-13 NOTE — Discharge Instructions (Signed)
See a podiatrist so they can help you with the pressure on your feet with your loss of sensation. You may need special shoes or inserts.

## 2017-04-16 ENCOUNTER — Encounter (HOSPITAL_COMMUNITY): Payer: Self-pay | Admitting: Emergency Medicine

## 2017-04-16 DIAGNOSIS — Z87891 Personal history of nicotine dependence: Secondary | ICD-10-CM | POA: Diagnosis not present

## 2017-04-16 DIAGNOSIS — M79671 Pain in right foot: Secondary | ICD-10-CM | POA: Insufficient documentation

## 2017-04-16 DIAGNOSIS — Z91013 Allergy to seafood: Secondary | ICD-10-CM | POA: Insufficient documentation

## 2017-04-16 DIAGNOSIS — Z79899 Other long term (current) drug therapy: Secondary | ICD-10-CM | POA: Diagnosis not present

## 2017-04-16 DIAGNOSIS — Z590 Homelessness: Secondary | ICD-10-CM | POA: Diagnosis not present

## 2017-04-16 DIAGNOSIS — Z7901 Long term (current) use of anticoagulants: Secondary | ICD-10-CM | POA: Insufficient documentation

## 2017-04-16 DIAGNOSIS — R109 Unspecified abdominal pain: Secondary | ICD-10-CM | POA: Diagnosis not present

## 2017-04-16 DIAGNOSIS — R319 Hematuria, unspecified: Secondary | ICD-10-CM | POA: Insufficient documentation

## 2017-04-16 DIAGNOSIS — G8929 Other chronic pain: Secondary | ICD-10-CM | POA: Insufficient documentation

## 2017-04-16 LAB — URINALYSIS, ROUTINE W REFLEX MICROSCOPIC
Bilirubin Urine: NEGATIVE
Glucose, UA: NEGATIVE mg/dL
Ketones, ur: 20 mg/dL — AB
Leukocytes, UA: NEGATIVE
Nitrite: NEGATIVE
Protein, ur: 100 mg/dL — AB
Specific Gravity, Urine: 1.021 (ref 1.005–1.030)
pH: 6 (ref 5.0–8.0)

## 2017-04-16 LAB — COMPREHENSIVE METABOLIC PANEL
ALT: 27 U/L (ref 17–63)
AST: 37 U/L (ref 15–41)
Albumin: 4 g/dL (ref 3.5–5.0)
Alkaline Phosphatase: 82 U/L (ref 38–126)
Anion gap: 6 (ref 5–15)
BUN: 16 mg/dL (ref 6–20)
CO2: 20 mmol/L — ABNORMAL LOW (ref 22–32)
Calcium: 9.3 mg/dL (ref 8.9–10.3)
Chloride: 107 mmol/L (ref 101–111)
Creatinine, Ser: 1.72 mg/dL — ABNORMAL HIGH (ref 0.61–1.24)
GFR calc Af Amer: 56 mL/min — ABNORMAL LOW (ref >60)
GFR calc non Af Amer: 48 mL/min — ABNORMAL LOW (ref >60)
Glucose, Bld: 72 mg/dL (ref 65–99)
Potassium: 3.7 mmol/L (ref 3.5–5.1)
Sodium: 133 mmol/L — ABNORMAL LOW (ref 135–145)
Total Bilirubin: 0.9 mg/dL (ref 0.3–1.2)
Total Protein: 8.2 g/dL — ABNORMAL HIGH (ref 6.5–8.1)

## 2017-04-16 LAB — CBC
HCT: 39.6 % (ref 39.0–52.0)
Hemoglobin: 13.2 g/dL (ref 13.0–17.0)
MCH: 32.7 pg (ref 26.0–34.0)
MCHC: 33.3 g/dL (ref 30.0–36.0)
MCV: 98 fL (ref 78.0–100.0)
Platelets: 255 10*3/uL (ref 150–400)
RBC: 4.04 MIL/uL — ABNORMAL LOW (ref 4.22–5.81)
RDW: 14 % (ref 11.5–15.5)
WBC: 12.4 10*3/uL — ABNORMAL HIGH (ref 4.0–10.5)

## 2017-04-16 LAB — LIPASE, BLOOD: Lipase: 25 U/L (ref 11–51)

## 2017-04-16 NOTE — ED Triage Notes (Signed)
Patient reports mid abdominal pain onset this afternoon , denies emesis or diarrhea , pt. added skin irritation around his colostomy. Denies fever or chills .

## 2017-04-17 ENCOUNTER — Emergency Department (HOSPITAL_COMMUNITY): Payer: Medicaid Other

## 2017-04-17 ENCOUNTER — Emergency Department (HOSPITAL_COMMUNITY)
Admission: EM | Admit: 2017-04-17 | Discharge: 2017-04-17 | Disposition: A | Discharge status: Home / Self Care | Payer: Medicaid Other | Attending: Emergency Medicine | Admitting: Emergency Medicine

## 2017-04-17 DIAGNOSIS — R319 Hematuria, unspecified: Secondary | ICD-10-CM

## 2017-04-17 DIAGNOSIS — G8929 Other chronic pain: Secondary | ICD-10-CM

## 2017-04-17 DIAGNOSIS — R109 Unspecified abdominal pain: Secondary | ICD-10-CM

## 2017-04-17 MED ORDER — IOPAMIDOL (ISOVUE-300) INJECTION 61%
INTRAVENOUS | Status: AC
  Administered 2017-04-17: 100 mL
  Filled 2017-04-17: qty 100

## 2017-04-17 MED ORDER — ONDANSETRON HCL 4 MG/2ML IJ SOLN
4.0000 mg | Freq: Once | INTRAMUSCULAR | Status: AC
  Administered 2017-04-17: 4 mg via INTRAVENOUS
  Filled 2017-04-17: qty 2

## 2017-04-17 MED ORDER — MORPHINE SULFATE (PF) 4 MG/ML IV SOLN
4.0000 mg | Freq: Once | INTRAVENOUS | Status: AC
  Administered 2017-04-17: 4 mg via INTRAVENOUS
  Filled 2017-04-17: qty 1

## 2017-04-17 MED ORDER — SODIUM CHLORIDE 0.9 % IV BOLUS (SEPSIS)
1000.0000 mL | Freq: Once | INTRAVENOUS | Status: AC
  Administered 2017-04-17: 1000 mL via INTRAVENOUS
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
## 2017-04-17 NOTE — ED Notes (Signed)
A11 sent to materials for ostomy supplies

## 2017-04-17 NOTE — ED Notes (Signed)
Pt getting dressed and ready to go. States he will take the bus home.

## 2017-04-17 NOTE — ED Notes (Signed)
Pt given 2 sets of ostomy supplies, pt applying new pouch at this time; pt also given [place] sandwich and ginger ale

## 2017-04-17 NOTE — ED Notes (Signed)
Patient transported to CT 

## 2017-04-18 LAB — URINE CULTURE: Culture: NO GROWTH

## 2017-04-24 ENCOUNTER — Emergency Department (HOSPITAL_COMMUNITY)
Admission: EM | Admit: 2017-04-24 | Discharge: 2017-04-24 | Disposition: A | Discharge status: Home / Self Care | Payer: PRIVATE HEALTH INSURANCE | Attending: Emergency Medicine | Admitting: Emergency Medicine

## 2017-04-24 ENCOUNTER — Encounter (HOSPITAL_COMMUNITY): Payer: Self-pay | Admitting: Emergency Medicine

## 2017-04-24 DIAGNOSIS — Z79899 Other long term (current) drug therapy: Secondary | ICD-10-CM | POA: Insufficient documentation

## 2017-04-24 DIAGNOSIS — R252 Cramp and spasm: Secondary | ICD-10-CM | POA: Diagnosis present

## 2017-04-24 DIAGNOSIS — Z87891 Personal history of nicotine dependence: Secondary | ICD-10-CM | POA: Diagnosis not present

## 2017-04-24 LAB — CBC WITH DIFFERENTIAL/PLATELET
Basophils Absolute: 0 10*3/uL (ref 0.0–0.1)
Basophils Relative: 0 %
Eosinophils Absolute: 0.5 10*3/uL (ref 0.0–0.7)
Eosinophils Relative: 4 %
HCT: 37.5 % — ABNORMAL LOW (ref 39.0–52.0)
Hemoglobin: 12.7 g/dL — ABNORMAL LOW (ref 13.0–17.0)
Lymphocytes Relative: 14 %
Lymphs Abs: 1.6 10*3/uL (ref 0.7–4.0)
MCH: 33 pg (ref 26.0–34.0)
MCHC: 33.9 g/dL (ref 30.0–36.0)
MCV: 97.4 fL (ref 78.0–100.0)
Monocytes Absolute: 1.3 10*3/uL — ABNORMAL HIGH (ref 0.1–1.0)
Monocytes Relative: 12 %
Neutro Abs: 7.9 10*3/uL — ABNORMAL HIGH (ref 1.7–7.7)
Neutrophils Relative %: 70 %
Platelets: 260 10*3/uL (ref 150–400)
RBC: 3.85 MIL/uL — ABNORMAL LOW (ref 4.22–5.81)
RDW: 14.2 % (ref 11.5–15.5)
WBC: 11.3 10*3/uL — ABNORMAL HIGH (ref 4.0–10.5)

## 2017-04-24 LAB — BASIC METABOLIC PANEL
Anion gap: 8 (ref 5–15)
BUN: 21 mg/dL — ABNORMAL HIGH (ref 6–20)
CO2: 19 mmol/L — ABNORMAL LOW (ref 22–32)
Calcium: 9.1 mg/dL (ref 8.9–10.3)
Chloride: 106 mmol/L (ref 101–111)
Creatinine, Ser: 1.72 mg/dL — ABNORMAL HIGH (ref 0.61–1.24)
GFR calc Af Amer: 56 mL/min — ABNORMAL LOW (ref >60)
GFR calc non Af Amer: 48 mL/min — ABNORMAL LOW (ref >60)
Glucose, Bld: 86 mg/dL (ref 65–99)
Potassium: 6.1 mmol/L — ABNORMAL HIGH (ref 3.5–5.1)
Sodium: 133 mmol/L — ABNORMAL LOW (ref 135–145)

## 2017-04-24 LAB — I-STAT CHEM 8, ED
BUN: 22 mg/dL — ABNORMAL HIGH (ref 6–20)
Calcium, Ion: 1.27 mmol/L (ref 1.15–1.40)
Chloride: 109 mmol/L (ref 101–111)
Creatinine, Ser: 1.5 mg/dL — ABNORMAL HIGH (ref 0.61–1.24)
Glucose, Bld: 79 mg/dL (ref 65–99)
HCT: 38 % — ABNORMAL LOW (ref 39.0–52.0)
Hemoglobin: 12.9 g/dL — ABNORMAL LOW (ref 13.0–17.0)
Potassium: 3.9 mmol/L (ref 3.5–5.1)
Sodium: 138 mmol/L (ref 135–145)
TCO2: 21 mmol/L — ABNORMAL LOW (ref 22–32)

## 2017-04-24 LAB — CK: Total CK: 787 U/L — ABNORMAL HIGH (ref 49–397)

## 2017-04-24 MED ORDER — ACETAMINOPHEN 500 MG PO TABS
1000.0000 mg | ORAL_TABLET | Freq: Once | ORAL | Status: AC
  Administered 2017-04-24: 1000 mg via ORAL
  Filled 2017-04-24: qty 2

## 2017-04-24 NOTE — Discharge Instructions (Signed)
Please follow up with [facility] or PCP for further aid with your colostomy bags. Drink plenty of fluids to rehydrate.

## 2017-04-24 NOTE — ED Notes (Signed)
Patient reports that ostomy isn't leaking at this time, he just needs bag to be emptied.  Assisted patient with emptying stool contents.
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
## 2017-04-24 NOTE — ED Triage Notes (Signed)
Pt c/o bilateral lower extremity cramping. Pt states symptoms are similar to when he was diagnosed with hypokalemia. Pt also states colostomy bag is not fitted appropriately, leaking at times.

## 2017-04-30 ENCOUNTER — Emergency Department (HOSPITAL_COMMUNITY)
Admission: EM | Admit: 2017-04-30 | Discharge: 2017-04-30 | Disposition: A | Discharge status: Home / Self Care | Payer: PRIVATE HEALTH INSURANCE | Attending: Emergency Medicine | Admitting: Emergency Medicine

## 2017-04-30 ENCOUNTER — Encounter (HOSPITAL_COMMUNITY): Payer: Self-pay

## 2017-04-30 ENCOUNTER — Emergency Department (HOSPITAL_COMMUNITY): Admit: 2017-04-30 | Payer: PRIVATE HEALTH INSURANCE

## 2017-04-30 ENCOUNTER — Emergency Department (HOSPITAL_BASED_OUTPATIENT_CLINIC_OR_DEPARTMENT_OTHER)
Admit: 2017-04-30 | Discharge: 2017-04-30 | Disposition: A | Discharge status: Home / Self Care | Payer: PRIVATE HEALTH INSURANCE | Attending: Emergency Medicine | Admitting: Emergency Medicine

## 2017-04-30 DIAGNOSIS — Z87891 Personal history of nicotine dependence: Secondary | ICD-10-CM | POA: Insufficient documentation

## 2017-04-30 DIAGNOSIS — Z9114 Patient's other noncompliance with medication regimen: Secondary | ICD-10-CM | POA: Diagnosis not present

## 2017-04-30 DIAGNOSIS — Z79899 Other long term (current) drug therapy: Secondary | ICD-10-CM | POA: Insufficient documentation

## 2017-04-30 DIAGNOSIS — Z91013 Allergy to seafood: Secondary | ICD-10-CM | POA: Insufficient documentation

## 2017-04-30 DIAGNOSIS — Z7189 Other specified counseling: Secondary | ICD-10-CM | POA: Diagnosis not present

## 2017-04-30 DIAGNOSIS — Z7901 Long term (current) use of anticoagulants: Secondary | ICD-10-CM | POA: Diagnosis not present

## 2017-04-30 DIAGNOSIS — Z432 Encounter for attention to ileostomy: Secondary | ICD-10-CM | POA: Diagnosis present

## 2017-04-30 DIAGNOSIS — M79609 Pain in unspecified limb: Secondary | ICD-10-CM

## 2017-04-30 DIAGNOSIS — R600 Localized edema: Secondary | ICD-10-CM | POA: Diagnosis not present

## 2017-04-30 MED ORDER — OXYCODONE-ACETAMINOPHEN 5-325 MG PO TABS: 1.0000 | ORAL_TABLET | Freq: Once | ORAL | Status: DC

## 2017-04-30 MED ORDER — LORAZEPAM 1 MG PO TABS: 1.0000 mg | ORAL_TABLET | Freq: Once | ORAL | Status: DC

## 2017-04-30 NOTE — ED Notes (Signed)
Pt taken to Vascular

## 2017-04-30 NOTE — ED Triage Notes (Signed)
Patient here requesting new colostomy bags, states his curent bag is leaking and making his skin raw, NAD

## 2017-04-30 NOTE — Discharge Instructions (Signed)
You do not have an acute blood clot in your R leg. It is still important ot take your medications as prescribed.   Neuropathic pain can be hard to treat and chronic pain is not ideally managed through the emergency room. Advised to follow-up with your PCP for further management. You need to try to obtain ostomy supplies as an outpatient as recommended to you on previous evaluations.

## 2017-04-30 NOTE — ED Notes (Signed)
Waiting for Ostomy Materials. Ostomy Nurse never returned call.

## 2017-04-30 NOTE — Progress Notes (Signed)
*  PRELIMINARY RESULTS* Vascular Ultrasound Right lower extremity venous duplex has been completed.  Preliminary findings: No evidence of acute deep vein thrombosis in the visualized veins.  Chronic changes again noted in the right common femoral, femoral and popliteal veins with continuous flow noted throughout the extremity.  Negative for baker's cyst on the right.  Chauncey Fischer 04/30/2017, 12:21 PM
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
## 2017-04-30 NOTE — ED Notes (Signed)
Pt returned from Vascular. Resting Soundly

## 2017-04-30 NOTE — ED Notes (Signed)
Called Ostomy Nurse to change bag

## 2017-05-01 ENCOUNTER — Encounter (HOSPITAL_COMMUNITY): Payer: Self-pay | Admitting: *Deleted

## 2017-05-01 ENCOUNTER — Emergency Department (HOSPITAL_COMMUNITY)
Admission: EM | Admit: 2017-05-01 | Discharge: 2017-05-01 | Disposition: A | Discharge status: Home / Self Care | Payer: Medicaid Other | Attending: Emergency Medicine | Admitting: Emergency Medicine

## 2017-05-01 DIAGNOSIS — Z79899 Other long term (current) drug therapy: Secondary | ICD-10-CM | POA: Diagnosis not present

## 2017-05-01 DIAGNOSIS — Z433 Encounter for attention to colostomy: Secondary | ICD-10-CM

## 2017-05-01 DIAGNOSIS — K9409 Other complications of colostomy: Secondary | ICD-10-CM | POA: Insufficient documentation

## 2017-05-01 DIAGNOSIS — Z87891 Personal history of nicotine dependence: Secondary | ICD-10-CM | POA: Insufficient documentation

## 2017-05-01 DIAGNOSIS — Z7901 Long term (current) use of anticoagulants: Secondary | ICD-10-CM | POA: Diagnosis not present
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
## 2017-05-01 NOTE — ED Triage Notes (Signed)
The pt is here to get a colostomy bag  He comes here instead of buyinh them  He was here yesterday for the same.  He reports that a nurse put the bag on wrong causing  The bag to leak

## 2017-05-01 NOTE — ED Notes (Signed)
Ostomy nurse called for eval of correct colostomy bag

## 2017-05-01 NOTE — Discharge Instructions (Signed)
You have been given resources for colostomy care supplies by case management during your previous visits. It is important to call [employer], the maker of your colostomy bags as you have been approved for assistance with obtaining your supplies. It is also important that you contact the [facility], to coordinate this.

## 2017-05-01 NOTE — Consult Note (Signed)
WOC nurse contacted by [facility] ED regaurding this patient and his supplies.  WOC nurse has assisted patient multiple times for this same issue.  I contacted [employer] DC program, they have provided Kayo with indigent supplies and he is eligible to receive these supplies for 6 months at a time before he is required to re-enroll in the program.   Per an email from [facility], [place] with [employer] was sent his order of supplies on 8/7 and the shipment was signed for by C. ALLEN on 8/9. It was shipped to [address] [place], [place] [address].    If he did not receive these supplies he must contact [employer] at [phone], option 6. To notify them.  Or if he needs to update his shipping address he must also contact them.  I spoke with a CM from [facility] just yesterday about this same patient.  If he list the [facility] as his home address he can receive the supplies there at  [address], [place] [address].   I also suggested that upon evaluation in the ED, consider an evaluation by he surgeon within [facility].  The cost of takedown surgery for his ostomy would benefit the entire health care system.  Additionally with the length of time Deyvi has been out of work and ill it would seem that [facility] would be an option for this patient.  [facility] covers the cost of ostomy supplies through particular DME companies, specifically [employer]  [phone].   Havyn Ramo [facility], CNS, [employer] [phone]

## 2017-05-01 NOTE — ED Notes (Signed)
Discharged by Mitzi Hansen with bus pass, meal and drinks

## 2017-05-01 NOTE — ED Notes (Signed)
GAVE PATIENT A HAPPY MEAL PATIENT IS RESTING WITH CALL BELL IN REACH

## 2017-05-01 NOTE — Progress Notes (Signed)
CSW spoke with pt. Pt is already set up [employer] [facility] for services. Pt reports that they are  not helping with his needs. CSW spoke with RN CM and was instructed for pt to go to [facility] if not utilizing the [employer] [facility] [employer]. NO further CSW needs at this time. CSW signing off.       Raymond Bowen, MSW, LCSW-A Emergency Department Clinical Social Worker [phone]

## 2017-05-08 ENCOUNTER — Emergency Department (HOSPITAL_COMMUNITY)
Admission: EM | Admit: 2017-05-08 | Discharge: 2017-05-08 | Disposition: A | Discharge status: Home / Self Care | Payer: Medicaid Other | Attending: Emergency Medicine | Admitting: Emergency Medicine

## 2017-05-08 ENCOUNTER — Encounter (HOSPITAL_COMMUNITY): Payer: Self-pay | Admitting: Emergency Medicine

## 2017-05-08 DIAGNOSIS — Z87891 Personal history of nicotine dependence: Secondary | ICD-10-CM | POA: Insufficient documentation

## 2017-05-08 DIAGNOSIS — Z79899 Other long term (current) drug therapy: Secondary | ICD-10-CM | POA: Insufficient documentation

## 2017-05-08 DIAGNOSIS — Z432 Encounter for attention to ileostomy: Secondary | ICD-10-CM | POA: Diagnosis present

## 2017-05-08 NOTE — ED Notes (Signed)
Declined W/C at D/C and was escorted to lobby by RN. 
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
## 2017-05-08 NOTE — Consult Note (Addendum)
WOC nurse has taken supplies to this patient today, but we have now exhausted everything we have.   Please consider having CM/SW contact [facility] to request that this patient be evaluated by a surgeon for takedown surgery, there is no other solution to this long term. The cost benefit for the hospital much outweighs the cost of a takedown surgery.  I am not sure if [facility] has declined to see patient due to outstanding bills with their practice, but this is what the patient has reported.  Additionally if patient will contact [employer] at the number below and verify if he did or did not receive the supplies and provide them with his [facility] they may be able to assist him.  The WOC nurse can not contact [employer] indigent program for patients it must be the patient's responsibility.   [facility] regaurding this patient and his supplies.  WOC nurse has assisted patient multiple times for this same issue.  I contacted [employer] DC program, they have provided Raymond Bowen with indigent supplies and he is eligible to receive these supplies for 6 months at a time before he is required to re-enroll in the program.   Per an email from [facility] with [facility] [employer]   [name] was sent his order of supplies on 8/7 and the shipment was signed for by C. ALLEN on 8/9. It was shipped to [address], [place], [place] [address].    If he did not receive these supplies he must contact [employer] at [phone], option 6. To notify them.  Or if he needs to update his shipping address he must also contact them.  I spoke with a CM from [facility] just yesterday about this same patient.  If he list [facility] as [facility] home address he can receive the supplies there at  [address] [place], [place] [address].   I also suggested that upon evaluation in the ED, consider an evaluation by he surgeon within [facility].  The cost of takedown surgery for his ostomy would benefit the entire health care  system.  Additionally with the length of time Raymond Bowen has been out of work and ill it would seem that [facility] would be an option for this patient.  [facility] covers the cost of ostomy supplies through particular DME companies, specifically [employer]  [phone].   Viviano Bir [facility] MSN,RN,CWOCN, CNS, [employer] [phone]

## 2017-05-08 NOTE — ED Triage Notes (Signed)
Pt reports he received the ostomy supply on 8/7 . Pt reports he was instructed  To change the complete 2 piece ostomy set up every 3 days and he ran out of supplies. The patient reports he ran out of supplies .

## 2017-05-08 NOTE — Discharge Planning (Signed)
Per WOC nurse 05/01/17:   [facility] nurse [facility] by [facility] regaurding this patient and his supplies.  WOC nurse has assisted patient multiple times for this same issue.  WOC nurse [employer] DC program, they have provided [name] with indigent supplies and he is eligible to receive these supplies for 6 months at a time before he is required to re-enroll in the program.   Per an email from [facility] De[facility]yke with [employer]   Issai was sent his order of supplies on 8/7 and the shipment was signed for by C. ALLEN on 8/9. It was shipped to [address], [place], [place] [address].    If he did not receive these supplies he must contact [employer] at [phone], option 6. To notify them.  Or if he needs to update his shipping address he must also contact them.

## 2017-05-08 NOTE — ED Triage Notes (Signed)
PT requesting illeostomy supplies because the referrals given on last visit  can not help him with supplies.

## 2017-05-08 NOTE — ED Triage Notes (Signed)
PT consulted by ostomy RN and care manager on this visit. Pt given  Replacement ostomy bags for home.

## 2017-05-08 NOTE — ED Triage Notes (Signed)
The surrounding skin is red and painful.

## 2017-05-08 NOTE — ED Triage Notes (Signed)
Pt reports leaking colostomy bag and pain on surface of abdomen due to skin breakdown.

## 2017-05-20 ENCOUNTER — Ambulatory Visit (HOSPITAL_COMMUNITY)
Admission: EM | Admit: 2017-05-20 | Discharge: 2017-05-21 | Disposition: A | Discharge status: Home / Self Care | Payer: Medicaid Other | Attending: Physician Assistant | Admitting: Physician Assistant

## 2017-05-20 ENCOUNTER — Encounter (HOSPITAL_COMMUNITY)
Admission: EM | Disposition: A | Discharge status: Home / Self Care | Payer: Self-pay | Source: Home / Self Care | Attending: Physician Assistant

## 2017-05-20 ENCOUNTER — Emergency Department (HOSPITAL_COMMUNITY): Payer: Medicaid Other

## 2017-05-20 ENCOUNTER — Encounter (HOSPITAL_COMMUNITY): Payer: Self-pay | Admitting: Nurse Practitioner

## 2017-05-20 ENCOUNTER — Emergency Department (HOSPITAL_COMMUNITY): Payer: Medicaid Other | Admitting: Certified Registered"

## 2017-05-20 DIAGNOSIS — S68120A Partial traumatic metacarpophalangeal amputation of right index finger, initial encounter: Secondary | ICD-10-CM | POA: Diagnosis present

## 2017-05-20 DIAGNOSIS — W25XXXA Contact with sharp glass, initial encounter: Secondary | ICD-10-CM | POA: Insufficient documentation

## 2017-05-20 DIAGNOSIS — Z87891 Personal history of nicotine dependence: Secondary | ICD-10-CM | POA: Diagnosis not present

## 2017-05-20 DIAGNOSIS — Z881 Allergy status to other antibiotic agents status: Secondary | ICD-10-CM | POA: Insufficient documentation

## 2017-05-20 DIAGNOSIS — M21371 Foot drop, right foot: Secondary | ICD-10-CM | POA: Diagnosis not present

## 2017-05-20 DIAGNOSIS — Z91013 Allergy to seafood: Secondary | ICD-10-CM | POA: Insufficient documentation

## 2017-05-20 DIAGNOSIS — Y9372 Activity, wrestling: Secondary | ICD-10-CM | POA: Diagnosis not present

## 2017-05-20 DIAGNOSIS — S68110A Complete traumatic metacarpophalangeal amputation of right index finger, initial encounter: Secondary | ICD-10-CM | POA: Diagnosis present

## 2017-05-20 HISTORY — PX: NERVE, TENDON AND ARTERY REPAIR: SHX5695

## 2017-05-20 HISTORY — PX: CLOSED REDUCTION FINGER WITH PERCUTANEOUS PINNING: SHX5612

## 2017-05-20 LAB — TYPE AND SCREEN
ABO/RH(D): O POS
Antibody Screen: NEGATIVE

## 2017-05-20 LAB — COMPREHENSIVE METABOLIC PANEL
ALT: 32 U/L (ref 17–63)
AST: 40 U/L (ref 15–41)
Albumin: 4.3 g/dL (ref 3.5–5.0)
Alkaline Phosphatase: 101 U/L (ref 38–126)
Anion gap: 12 (ref 5–15)
BUN: 16 mg/dL (ref 6–20)
CO2: 15 mmol/L — ABNORMAL LOW (ref 22–32)
Calcium: 9.6 mg/dL (ref 8.9–10.3)
Chloride: 106 mmol/L (ref 101–111)
Creatinine, Ser: 2.06 mg/dL — ABNORMAL HIGH (ref 0.61–1.24)
GFR calc Af Amer: 45 mL/min — ABNORMAL LOW (ref >60)
GFR calc non Af Amer: 39 mL/min — ABNORMAL LOW (ref >60)
Glucose, Bld: 84 mg/dL (ref 65–99)
Potassium: 3.5 mmol/L (ref 3.5–5.1)
Sodium: 133 mmol/L — ABNORMAL LOW (ref 135–145)
Total Bilirubin: 0.2 mg/dL — ABNORMAL LOW (ref 0.3–1.2)
Total Protein: 8.8 g/dL — ABNORMAL HIGH (ref 6.5–8.1)

## 2017-05-20 LAB — CBC
HCT: 43.1 % (ref 39.0–52.0)
Hemoglobin: 14.7 g/dL (ref 13.0–17.0)
MCH: 33.3 pg (ref 26.0–34.0)
MCHC: 34.1 g/dL (ref 30.0–36.0)
MCV: 97.5 fL (ref 78.0–100.0)
Platelets: 267 10*3/uL (ref 150–400)
RBC: 4.42 MIL/uL (ref 4.22–5.81)
RDW: 13.4 % (ref 11.5–15.5)
WBC: 13.5 10*3/uL — ABNORMAL HIGH (ref 4.0–10.5)

## 2017-05-20 SURGERY — CLOSED REDUCTION, FINGER, WITH PERCUTANEOUS PINNING
Anesthesia: General | Site: Finger | Laterality: Right

## 2017-05-20 MED ORDER — LACTATED RINGERS IV SOLN
INTRAVENOUS | Status: DC | PRN
  Administered 2017-05-20 – 2017-05-21 (×2): via INTRAVENOUS

## 2017-05-20 MED ORDER — PROPOFOL 10 MG/ML IV BOLUS
INTRAVENOUS | Status: AC
  Filled 2017-05-20: qty 20

## 2017-05-20 MED ORDER — MORPHINE SULFATE (PF) 4 MG/ML IV SOLN
4.0000 mg | Freq: Once | INTRAVENOUS | Status: AC
  Administered 2017-05-20: 4 mg via INTRAVENOUS
  Filled 2017-05-20: qty 1

## 2017-05-20 MED ORDER — SUFENTANIL CITRATE 50 MCG/ML IV SOLN
INTRAVENOUS | Status: AC
  Filled 2017-05-20: qty 1

## 2017-05-20 MED ORDER — ONDANSETRON HCL 4 MG/2ML IJ SOLN
4.0000 mg | Freq: Once | INTRAMUSCULAR | Status: AC
  Administered 2017-05-20: 4 mg via INTRAVENOUS
  Filled 2017-05-20: qty 2

## 2017-05-20 MED ORDER — TETANUS-DIPHTH-ACELL PERTUSSIS 5-2.5-18.5 LF-MCG/0.5 IM SUSP
0.5000 mL | Freq: Once | INTRAMUSCULAR | Status: AC
  Administered 2017-05-20: 0.5 mL via INTRAMUSCULAR
  Filled 2017-05-20: qty 0.5

## 2017-05-20 MED ORDER — MIDAZOLAM HCL 2 MG/2ML IJ SOLN
INTRAMUSCULAR | Status: AC
  Filled 2017-05-20: qty 2

## 2017-05-20 SURGICAL SUPPLY — 83 items
APL SKNCLS STERI-STRIP NONHPOA (GAUZE/BANDAGES/DRESSINGS) ×1
BAG DECANTER FOR FLEXI CONT (MISCELLANEOUS) IMPLANT
BANDAGE ACE 3X5.8 VEL STRL LF (GAUZE/BANDAGES/DRESSINGS) ×3 IMPLANT
BANDAGE ACE 4X5 VEL STRL LF (GAUZE/BANDAGES/DRESSINGS) ×2 IMPLANT
BANDAGE COBAN STERILE 2 (GAUZE/BANDAGES/DRESSINGS) IMPLANT
BENZOIN TINCTURE PRP APPL 2/3 (GAUZE/BANDAGES/DRESSINGS) ×2 IMPLANT
BLADE CLIPPER SURG (BLADE) ×2 IMPLANT
BLADE MINI RND TIP GREEN BEAV (BLADE) IMPLANT
BLADE SURG 15 STRL LF DISP TIS (BLADE) ×2 IMPLANT
BLADE SURG 15 STRL SS (BLADE) ×4
BNDG CMPR 9X4 STRL LF SNTH (GAUZE/BANDAGES/DRESSINGS) ×1
BNDG COHESIVE 1X5 TAN STRL LF (GAUZE/BANDAGES/DRESSINGS) ×1 IMPLANT
BNDG ESMARK 4X9 LF (GAUZE/BANDAGES/DRESSINGS) ×2 IMPLANT
BNDG GAUZE ELAST 4 BULKY (GAUZE/BANDAGES/DRESSINGS) ×2 IMPLANT
CANISTER SUCT 3000ML (MISCELLANEOUS) ×2 IMPLANT
CHLORAPREP W/TINT 26ML (MISCELLANEOUS) ×1 IMPLANT
CORDS BIPOLAR (ELECTRODE) ×2 IMPLANT
COVER MAYO STAND STRL (DRAPES) ×2 IMPLANT
COVER SURGICAL LIGHT HANDLE (MISCELLANEOUS) ×2 IMPLANT
CUFF TOURNIQUET SINGLE 18IN (TOURNIQUET CUFF) ×1 IMPLANT
CUFF TOURNIQUET SINGLE 24IN (TOURNIQUET CUFF) IMPLANT
DECANTER SPIKE VIAL GLASS SM (MISCELLANEOUS) ×2 IMPLANT
DRAPE C-ARM MINI 42X72 WSTRAPS (DRAPES) ×2 IMPLANT
DRAPE MICROSCOPE LEICA (MISCELLANEOUS) ×1 IMPLANT
DRAPE OEC MINIVIEW 54X84 (DRAPES) ×2 IMPLANT
DRAPE SURG 17X23 STRL (DRAPES) ×2 IMPLANT
DRSG EMULSION OIL 3X3 NADH (GAUZE/BANDAGES/DRESSINGS) ×2 IMPLANT
GAUZE SPONGE 4X4 12PLY STRL (GAUZE/BANDAGES/DRESSINGS) ×2 IMPLANT
GAUZE XEROFORM 1X8 LF (GAUZE/BANDAGES/DRESSINGS) ×2 IMPLANT
GLOVE BIO SURGEON STRL SZ7.5 (GLOVE) ×2 IMPLANT
GLOVE BIOGEL PI IND STRL 8 (GLOVE) ×1 IMPLANT
GLOVE BIOGEL PI INDICATOR 8 (GLOVE) ×1
GOWN STRL REUS W/ TWL LRG LVL3 (GOWN DISPOSABLE) ×3 IMPLANT
GOWN STRL REUS W/ TWL XL LVL3 (GOWN DISPOSABLE) ×1 IMPLANT
GOWN STRL REUS W/TWL LRG LVL3 (GOWN DISPOSABLE) ×6
GOWN STRL REUS W/TWL XL LVL3 (GOWN DISPOSABLE) ×2
KIT BASIN OR (CUSTOM PROCEDURE TRAY) ×2 IMPLANT
KIT ROOM TURNOVER OR (KITS) ×2 IMPLANT
LOOP VESSEL MAXI BLUE (MISCELLANEOUS) IMPLANT
MANIFOLD NEPTUNE II (INSTRUMENTS) ×2 IMPLANT
NDL HYPO 25GX1X1/2 BEV (NEEDLE) ×1 IMPLANT
NDL HYPO 25X1 1.5 SAFETY (NEEDLE) IMPLANT
NDL SAFETY ECLIPSE 18X1.5 (NEEDLE) IMPLANT
NEEDLE HYPO 18GX1.5 SHARP (NEEDLE)
NEEDLE HYPO 25GX1X1/2 BEV (NEEDLE) ×2 IMPLANT
NEEDLE HYPO 25X1 1.5 SAFETY (NEEDLE) IMPLANT
NS IRRIG 1000ML POUR BTL (IV SOLUTION) ×2 IMPLANT
PACK ORTHO EXTREMITY (CUSTOM PROCEDURE TRAY) ×2 IMPLANT
PAD ARMBOARD 7.5X6 YLW CONV (MISCELLANEOUS) ×4 IMPLANT
PAD CAST 3X4 CTTN HI CHSV (CAST SUPPLIES) ×1 IMPLANT
PAD CAST 4YDX4 CTTN HI CHSV (CAST SUPPLIES) IMPLANT
PADDING CAST ABS 4INX4YD NS (CAST SUPPLIES) ×1
PADDING CAST ABS COTTON 4X4 ST (CAST SUPPLIES) ×1 IMPLANT
PADDING CAST COTTON 3X4 STRL (CAST SUPPLIES) ×2
PADDING CAST COTTON 4X4 STRL (CAST SUPPLIES)
SPEAR EYE SURG WECK-CEL (MISCELLANEOUS) ×2 IMPLANT
SPLINT PLASTER CAST XFAST 3X15 (CAST SUPPLIES) IMPLANT
SPLINT PLASTER XTRA FASTSET 3X (CAST SUPPLIES)
STOCKINETTE 4X48 STRL (DRAPES) ×2 IMPLANT
STRIP CLOSURE SKIN 1/2X4 (GAUZE/BANDAGES/DRESSINGS) ×2 IMPLANT
SUCTION FRAZIER HANDLE 10FR (MISCELLANEOUS) ×1
SUCTION TUBE FRAZIER 10FR DISP (MISCELLANEOUS) ×1 IMPLANT
SUT CHROMIC 5 0 P 3 (SUTURE) ×2 IMPLANT
SUT ETHIBOND 3-0 V-5 (SUTURE) IMPLANT
SUT ETHILON 4 0 P 3 18 (SUTURE) IMPLANT
SUT ETHILON 4 0 PS 2 18 (SUTURE) ×1 IMPLANT
SUT FIBERWIRE 4-0 18 TAPR NDL (SUTURE)
SUT MERSILENE 6 0 P 1 (SUTURE) IMPLANT
SUT MON AB 5-0 P3 18 (SUTURE) ×2 IMPLANT
SUT NYLON 9 0 VRM6 (SUTURE) IMPLANT
SUT PROLENE 4 0 P 3 18 (SUTURE) IMPLANT
SUT PROLENE 6 0 P 1 18 (SUTURE) IMPLANT
SUT SILK 4 0 PS 2 (SUTURE) IMPLANT
SUT VICRYL 4-0 PS2 18IN ABS (SUTURE) IMPLANT
SUTURE FIBERWR 4-0 18 TAPR NDL (SUTURE) IMPLANT
SYR BULB 3OZ (MISCELLANEOUS) ×2 IMPLANT
SYR CONTROL 10ML LL (SYRINGE) ×2 IMPLANT
TOWEL OR 17X24 6PK STRL BLUE (TOWEL DISPOSABLE) ×4 IMPLANT
TOWEL OR 17X26 10 PK STRL BLUE (TOWEL DISPOSABLE) ×2 IMPLANT
TUBE CONNECTING 12X1/4 (SUCTIONS) ×2 IMPLANT
TUBE FEEDING ENTERAL 5FR 16IN (TUBING) IMPLANT
UNDERPAD 30X30 (UNDERPADS AND DIAPERS) ×2 IMPLANT
WATER STERILE IRR 1000ML POUR (IV SOLUTION) ×2 IMPLANT
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
## 2017-05-20 NOTE — ED Notes (Signed)
Hand Surg at bedside

## 2017-05-20 NOTE — Anesthesia Preprocedure Evaluation (Addendum)
Anesthesia Evaluation  Patient identified by MRN, date of birth, ID band Patient awake    Reviewed: Allergy & Precautions, H&P , NPO status , Patient's Chart, lab work & pertinent test results  Airway Mallampati: II  TM Distance: >3 FB Neck ROM: Full    Dental no notable dental hx. (+) Teeth Intact, Dental Advisory Given   Pulmonary neg pulmonary ROS, former smoker,    Pulmonary exam normal breath sounds clear to auscultation       Cardiovascular negative cardio ROS   Rhythm:Regular Rate:Normal     Neuro/Psych negative neurological ROS  negative psych ROS   GI/Hepatic negative GI ROS, Neg liver ROS,   Endo/Other  negative endocrine ROS  Renal/GU Renal InsufficiencyRenal disease  negative genitourinary   Musculoskeletal   Abdominal   Peds  Hematology negative hematology ROS (+)   Anesthesia Other Findings   Reproductive/Obstetrics negative OB ROS                            Anesthesia Physical Anesthesia Plan  ASA: II and emergent  Anesthesia Plan: General   Post-op Pain Management:    Induction: Intravenous, Rapid sequence and Cricoid pressure planned  PONV Risk Score and Plan: 3 and Ondansetron, Dexamethasone and Midazolam  Airway Management Planned: Oral ETT  Additional Equipment:   Intra-op Plan:   Post-operative Plan: Extubation in OR  Informed Consent: I have reviewed the patients History and Physical, chart, labs and discussed the procedure including the risks, benefits and alternatives for the proposed anesthesia with the patient or authorized representative who has indicated his/her understanding and acceptance.   Dental advisory given  Plan Discussed with: CRNA  Anesthesia Plan Comments:         Anesthesia Quick Evaluation

## 2017-05-20 NOTE — ED Notes (Signed)
Report given to Sampson GoonFitzgerald, MD Anesthesia
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
## 2017-05-20 NOTE — ED Notes (Signed)
Pt to OR at this time; belongings removed and given to patients childrens mother

## 2017-05-20 NOTE — ED Notes (Signed)
Portable at bedside 

## 2017-05-21 ENCOUNTER — Encounter (HOSPITAL_COMMUNITY): Payer: Self-pay | Admitting: Orthopedic Surgery

## 2017-05-21 DIAGNOSIS — S68110A Complete traumatic metacarpophalangeal amputation of right index finger, initial encounter: Secondary | ICD-10-CM | POA: Diagnosis present

## 2017-05-21 MED ORDER — LACTATED RINGERS IV SOLN
INTRAVENOUS | Status: DC
  Administered 2017-05-21: 03:00:00 via INTRAVENOUS

## 2017-05-21 MED ORDER — CEFAZOLIN SODIUM 1 G IJ SOLR
INTRAMUSCULAR | Status: AC
  Filled 2017-05-21: qty 20

## 2017-05-21 MED ORDER — CEFAZOLIN SODIUM-DEXTROSE 1-4 GM/50ML-% IV SOLN
1.0000 g | Freq: Three times a day (TID) | INTRAVENOUS | Status: DC
  Administered 2017-05-21: 1 g via INTRAVENOUS
  Filled 2017-05-21 (×4): qty 50

## 2017-05-21 MED ORDER — OXYCODONE-ACETAMINOPHEN 10-325 MG PO TABS: ORAL_TABLET | ORAL | 0 refills | Status: DC

## 2017-05-21 MED ORDER — ONDANSETRON HCL 4 MG/2ML IJ SOLN: 4.0000 mg | Freq: Four times a day (QID) | INTRAMUSCULAR | Status: DC | PRN

## 2017-05-21 MED ORDER — ONDANSETRON HCL 4 MG/2ML IJ SOLN
INTRAMUSCULAR | Status: AC
  Filled 2017-05-21: qty 2

## 2017-05-21 MED ORDER — 0.9 % SODIUM CHLORIDE (POUR BTL) OPTIME
TOPICAL | Status: DC | PRN
  Administered 2017-05-21: 1000 mL

## 2017-05-21 MED ORDER — MIDAZOLAM HCL 5 MG/5ML IJ SOLN
INTRAMUSCULAR | Status: DC | PRN
  Administered 2017-05-20: 2 mg via INTRAVENOUS

## 2017-05-21 MED ORDER — VITAMIN C 500 MG PO TABS
1000.0000 mg | ORAL_TABLET | Freq: Every day | ORAL | Status: DC
  Administered 2017-05-21: 1000 mg via ORAL
  Filled 2017-05-21: qty 2

## 2017-05-21 MED ORDER — CEFAZOLIN SODIUM-DEXTROSE 1-4 GM/50ML-% IV SOLN
INTRAVENOUS | Status: AC
  Filled 2017-05-21: qty 50

## 2017-05-21 MED ORDER — LIDOCAINE HCL (CARDIAC) 20 MG/ML IV SOLN
INTRAVENOUS | Status: DC | PRN
  Administered 2017-05-20: 100 mg via INTRATRACHEAL

## 2017-05-21 MED ORDER — CEFAZOLIN SODIUM-DEXTROSE 2-3 GM-%(50ML) IV SOLR
INTRAVENOUS | Status: DC | PRN
  Administered 2017-05-21: 2 g via INTRAVENOUS

## 2017-05-21 MED ORDER — SUFENTANIL CITRATE 50 MCG/ML IV SOLN
INTRAVENOUS | Status: DC | PRN
  Administered 2017-05-21: 10 ug via INTRAVENOUS

## 2017-05-21 MED ORDER — METHOCARBAMOL 500 MG PO TABS
500.0000 mg | ORAL_TABLET | Freq: Four times a day (QID) | ORAL | Status: DC | PRN
  Administered 2017-05-21: 500 mg via ORAL
  Filled 2017-05-21 (×2): qty 1

## 2017-05-21 MED ORDER — METHOCARBAMOL 500 MG PO TABS
ORAL_TABLET | ORAL | Status: AC
  Filled 2017-05-21: qty 1

## 2017-05-21 MED ORDER — LIDOCAINE 2% (20 MG/ML) 5 ML SYRINGE
INTRAMUSCULAR | Status: AC
  Filled 2017-05-21: qty 5

## 2017-05-21 MED ORDER — BUPIVACAINE HCL (PF) 0.25 % IJ SOLN
INTRAMUSCULAR | Status: DC | PRN
  Administered 2017-05-21: 20 mL

## 2017-05-21 MED ORDER — SULFAMETHOXAZOLE-TRIMETHOPRIM 800-160 MG PO TABS: 1.0000 | ORAL_TABLET | Freq: Two times a day (BID) | ORAL | 0 refills | Status: DC

## 2017-05-21 MED ORDER — SUCCINYLCHOLINE CHLORIDE 20 MG/ML IJ SOLN
INTRAMUSCULAR | Status: DC | PRN
  Administered 2017-05-20: 100 mg via INTRAVENOUS

## 2017-05-21 MED ORDER — CEFAZOLIN SODIUM-DEXTROSE 1-4 GM/50ML-% IV SOLN
1.0000 g | INTRAVENOUS | Status: AC
  Administered 2017-05-21: 1 g via INTRAVENOUS
  Filled 2017-05-21: qty 50

## 2017-05-21 MED ORDER — DULOXETINE HCL 60 MG PO CPEP: 60.0000 mg | ORAL_CAPSULE | Freq: Every day | ORAL | Status: DC

## 2017-05-21 MED ORDER — BUPIVACAINE HCL (PF) 0.25 % IJ SOLN
INTRAMUSCULAR | Status: AC
  Filled 2017-05-21: qty 30

## 2017-05-21 MED ORDER — GABAPENTIN 600 MG PO TABS
600.0000 mg | ORAL_TABLET | Freq: Three times a day (TID) | ORAL | Status: DC
Start: 2017-05-21 — End: 2017-05-21
  Administered 2017-05-21 (×2): 600 mg via ORAL
  Filled 2017-05-21 (×2): qty 1

## 2017-05-21 MED ORDER — OXYCODONE HCL 5 MG PO TABS
5.0000 mg | ORAL_TABLET | Freq: Four times a day (QID) | ORAL | Status: DC | PRN
  Administered 2017-05-21 (×2): 5 mg via ORAL
  Filled 2017-05-21 (×2): qty 1

## 2017-05-21 MED ORDER — DIPHENHYDRAMINE HCL 25 MG PO CAPS: 25.0000 mg | ORAL_CAPSULE | Freq: Four times a day (QID) | ORAL | Status: DC | PRN

## 2017-05-21 MED ORDER — DEXAMETHASONE SODIUM PHOSPHATE 10 MG/ML IJ SOLN
INTRAMUSCULAR | Status: AC
  Filled 2017-05-21: qty 1

## 2017-05-21 MED ORDER — ONDANSETRON HCL 4 MG/2ML IJ SOLN
INTRAMUSCULAR | Status: DC | PRN
  Administered 2017-05-21: 4 mg via INTRAVENOUS

## 2017-05-21 MED ORDER — HYDROMORPHONE HCL 1 MG/ML IJ SOLN
0.2500 mg | INTRAMUSCULAR | Status: DC | PRN
  Administered 2017-05-21 (×2): 0.5 mg via INTRAVENOUS

## 2017-05-21 MED ORDER — GLYCOPYRROLATE 0.2 MG/ML IJ SOLN
INTRAMUSCULAR | Status: DC | PRN
  Administered 2017-05-20: 0.2 mg via INTRAVENOUS

## 2017-05-21 MED ORDER — SODIUM CHLORIDE 0.9 % IJ SOLN
INTRAMUSCULAR | Status: AC
  Filled 2017-05-21: qty 10

## 2017-05-21 MED ORDER — RIVAROXABAN 20 MG PO TABS
20.0000 mg | ORAL_TABLET | Freq: Every day | ORAL | Status: DC
Start: 2017-05-21 — End: 2017-05-21
  Administered 2017-05-21: 20 mg via ORAL
  Filled 2017-05-21: qty 1

## 2017-05-21 MED ORDER — HYDROMORPHONE HCL 1 MG/ML IJ SOLN
INTRAMUSCULAR | Status: AC
  Administered 2017-05-21: 0.5 mg via INTRAVENOUS
  Filled 2017-05-21: qty 1

## 2017-05-21 MED ORDER — DEXAMETHASONE SODIUM PHOSPHATE 10 MG/ML IJ SOLN
INTRAMUSCULAR | Status: DC | PRN
  Administered 2017-05-21: 10 mg via INTRAVENOUS

## 2017-05-21 MED ORDER — SUCCINYLCHOLINE CHLORIDE 200 MG/10ML IV SOSY
PREFILLED_SYRINGE | INTRAVENOUS | Status: AC
  Filled 2017-05-21: qty 10

## 2017-05-21 MED ORDER — METHOCARBAMOL 1000 MG/10ML IJ SOLN
500.0000 mg | Freq: Four times a day (QID) | INTRAVENOUS | Status: DC | PRN
  Filled 2017-05-21: qty 5

## 2017-05-21 MED ORDER — ONDANSETRON HCL 4 MG PO TABS: 4.0000 mg | ORAL_TABLET | Freq: Four times a day (QID) | ORAL | Status: DC | PRN

## 2017-05-21 MED ORDER — PROPOFOL 10 MG/ML IV BOLUS
INTRAVENOUS | Status: DC | PRN
  Administered 2017-05-20: 140 mg via INTRAVENOUS

## 2017-05-21 MED ORDER — OXYCODONE-ACETAMINOPHEN 5-325 MG PO TABS
1.0000 | ORAL_TABLET | Freq: Four times a day (QID) | ORAL | Status: DC | PRN
  Administered 2017-05-21 (×2): 1 via ORAL
  Filled 2017-05-21 (×2): qty 1

## 2017-05-21 MED ORDER — TEMAZEPAM 15 MG PO CAPS: 15.0000 mg | ORAL_CAPSULE | Freq: Every evening | ORAL | Status: DC | PRN

## 2017-05-21 NOTE — Progress Notes (Signed)
  Social work was contacted and a Conservation officer, naturetaxi voucher was obtained; transport is on it's way.

## 2017-05-21 NOTE — Transfer of Care (Signed)
Immediate Anesthesia Transfer of Care Note  Patient: Raymond Bowen  Procedure(s) Performed: Revision Amputation Right Index Finger (Right Finger) Repair Simple Laceration of Long Finger and Thumb (Right Hand) (Right Finger)  Patient Location: PACU  Anesthesia Type:General  Level of Consciousness: oriented, sedated, drowsy, patient cooperative and responds to stimulation  Airway & Oxygen Therapy: Patient Spontanous Breathing and Patient connected to nasal cannula oxygen  Post-op Assessment: Report given to RN, Post -op Vital signs reviewed and stable and Patient moving all extremities X 4  Post vital signs: Reviewed and stable  Last Vitals:  Vitals:   05/20/17 2300 05/20/17 2315  BP: 120/90 119/84  Pulse: (!) 117 93  Resp:    Temp:    SpO2: 99% 99%    Last Pain:  Vitals:   05/20/17 2244  PainSc: 10-Worst pain ever         Complications: No apparent anesthesia complications

## 2017-05-21 NOTE — Op Note (Signed)
[id number] 

## 2017-05-21 NOTE — Progress Notes (Signed)
Attempt to call report.

## 2017-05-21 NOTE — Anesthesia Procedure Notes (Signed)
Procedure Name: Intubation Date/Time: 05/20/2017 11:54 PM Performed by: Claris Che Pre-anesthesia Checklist: Patient identified, Emergency Drugs available, Suction available, Patient being monitored and Timeout performed Patient Re-evaluated:Patient Re-evaluated prior to induction Oxygen Delivery Method: Circle system utilized Preoxygenation: Pre-oxygenation with 100% oxygen Induction Type: IV induction, Rapid sequence and Cricoid Pressure applied Laryngoscope Size: Mac and 3 Grade View: Grade II Tube type: Oral Tube size: 7.5 mm Number of attempts: 1 Airway Equipment and Method: Stylet Placement Confirmation: ETT inserted through vocal cords under direct vision and positive ETCO2 Secured at: 24 cm Tube secured with: Tape Dental Injury: Teeth and Oropharynx as per pre-operative assessment

## 2017-05-21 NOTE — Anesthesia Postprocedure Evaluation (Signed)
Anesthesia Post Note  Patient: Alvis LemmingsBrian K Onofrio  Procedure(s) Performed: Revision Amputation Right Index Finger (Right Finger) Repair Simple Laceration of Long Finger and Thumb (Right Hand) (Right Finger)     Patient location during evaluation: PACU Anesthesia Type: General Level of consciousness: awake and alert Pain management: pain level controlled Vital Signs Assessment: post-procedure vital signs reviewed and stable Respiratory status: spontaneous breathing, nonlabored ventilation and respiratory function stable Cardiovascular status: blood pressure returned to baseline and stable Postop Assessment: no apparent nausea or vomiting Anesthetic complications: no    Last Vitals:  Vitals:   05/21/17 0247 05/21/17 0316  BP: 120/86 131/82  Pulse: 81 78  Resp: 14 20  Temp: 36.7 C 36.8 C  SpO2: 100% 100%    Last Pain:  Vitals:   05/21/17 0333  TempSrc:   PainSc: 10-Worst pain ever                 Sonu Kruckenberg,W. EDMOND

## 2017-05-21 NOTE — Progress Notes (Signed)
CSW received consult regarding transportation needs. CSW offered bus pass, however patient reports that he has a ride coming to get him after 5pm.   CSW signing off. Please re-consult if needed.  Osborne Cascoadia Shahan Starks LCSWA [phone]

## 2017-05-21 NOTE — Progress Notes (Signed)
Patient stated he had a ride coming at 5 pm and he needed his friend to bring his leg brace, house key, and his cane. At 5:30pm patient stated he no longer had a ride, he stated he needs a taxi voucher for a ride to his friend's house. On call Social worker was paged and now waiting for a taxi voucher.

## 2017-05-21 NOTE — Progress Notes (Signed)
Patient arrived to the unit via bed from PACU.  Patient is alert and oriented x 4. Complaints of surgical pain. Will administer PRN pain meds. Vital signs:  Temp 98.3; BP: 131/82; Pulse: 78; Resp: 20; and 100% on room air.  Skin assessment complete.  IV intact to the left antecubital.  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 Old surgical scars to the abdomen.   Partial right index finger amputated.   Educated the patient on how to reach the staff on the unit.  Lowered the bed and placed the call light within reach.

## 2017-05-21 NOTE — Progress Notes (Signed)
Multiple attempts made to contact ride home -no  message left/voice mail full -unable to locate - no other ride available
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
## 2017-05-21 NOTE — Brief Op Note (Signed)
05/20/2017 - 05/21/2017  1:07 AM  PATIENT:  Raymond Bowen  41 y.o. male  PRE-OPERATIVE DIAGNOSIS:  right index finger open middle phalanx fracture, tendon/artery/nerve lacerations  POST-OPERATIVE DIAGNOSIS:  right index finger amputation except dermal bridge; long finger 1 cm simple laceration; thumb 1 cm simple laceration  PROCEDURE:  Procedure(s): Revision Amputation Right Index Finger (Right) Repair Simple Laceration of Long Finger and Thumb (Right Hand) (Right)  SURGEON:  Surgeon(s) and Role:    Betha Loa* Brenlyn Beshara, MD - Primary  PHYSICIAN ASSISTANT:   ASSISTANTS: none   ANESTHESIA:   general  EBL:  minimal  BLOOD ADMINISTERED:none  DRAINS: none   LOCAL MEDICATIONS USED:  MARCAINE     SPECIMEN:  No Specimen  DISPOSITION OF SPECIMEN:  N/A  COUNTS:  YES  TOURNIQUET:   Total Tourniquet Time Documented: Upper Arm (Right) - 51 minutes Total: Upper Arm (Right) - 51 minutes   DICTATION: .Other Dictation: Dictation Number [id number]  PLAN OF CARE: Discharge to home after PACU  PATIENT DISPOSITION:  PACU - hemodynamically stable.

## 2017-05-21 NOTE — Discharge Instructions (Signed)

## 2017-05-22 LAB — ABO/RH: ABO/RH(D): O POS

## 2017-05-22 NOTE — Progress Notes (Signed)
Pt left floor via wheelchair to ED; from there discharged to home via taxi.  Pt was stable, vital signs stable, discharge teaching given prior to this nurse's shift.  All questions were answered.  Prescriptions given.

## 2017-05-28 ENCOUNTER — Encounter: Payer: Self-pay | Admitting: *Deleted

## 2017-05-28 ENCOUNTER — Emergency Department (HOSPITAL_COMMUNITY)
Admission: EM | Admit: 2017-05-28 | Discharge: 2017-05-28 | Disposition: A | Discharge status: Home / Self Care | Payer: Medicaid Other | Attending: Emergency Medicine | Admitting: Emergency Medicine

## 2017-05-28 ENCOUNTER — Encounter (HOSPITAL_COMMUNITY): Payer: Self-pay | Admitting: Emergency Medicine

## 2017-05-28 DIAGNOSIS — Z86718 Personal history of other venous thrombosis and embolism: Secondary | ICD-10-CM | POA: Diagnosis not present

## 2017-05-28 DIAGNOSIS — Z79899 Other long term (current) drug therapy: Secondary | ICD-10-CM | POA: Diagnosis not present

## 2017-05-28 DIAGNOSIS — Z87891 Personal history of nicotine dependence: Secondary | ICD-10-CM | POA: Diagnosis not present

## 2017-05-28 DIAGNOSIS — K9403 Colostomy malfunction: Secondary | ICD-10-CM | POA: Insufficient documentation

## 2017-05-28 DIAGNOSIS — K9400 Colostomy complication, unspecified: Secondary | ICD-10-CM

## 2017-05-28 MED ORDER — SULFAMETHOXAZOLE-TRIMETHOPRIM 800-160 MG PO TABS
1.0000 | ORAL_TABLET | Freq: Once | ORAL | Status: AC
  Administered 2017-05-28: 1 via ORAL
  Filled 2017-05-28: qty 1

## 2017-05-28 MED ORDER — OXYCODONE-ACETAMINOPHEN 5-325 MG PO TABS
1.0000 | ORAL_TABLET | Freq: Once | ORAL | Status: AC
  Administered 2017-05-28: 1 via ORAL
  Filled 2017-05-28: qty 1

## 2017-05-28 NOTE — ED Notes (Signed)
Pt given discounts for prescriptions from [facility]; [facility] [address] and is unsure how to get prescriptions from there to be filled by [facility]. Case manager to follow up with patient regarding medications
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
## 2017-05-28 NOTE — Progress Notes (Unsigned)
EDCM spoke with pt in ED lobby regarding medication assistance.  [facility]  Pt [facility] call back with [facility]    Tajai Ihde J. Lucretia RoersWood, RN, BSN, [place] [phone]

## 2017-05-28 NOTE — ED Notes (Signed)
Ostomy supplies at bedside.

## 2017-05-28 NOTE — ED Notes (Signed)
Pt provided discharge instructions, two extra colostomy bags, and two drinks. Security made aware that pt will be sitting in the lobby until able to follow up with case manager. Bus pass given at discharge

## 2017-05-28 NOTE — Discharge Instructions (Signed)
Return here as needed.  I will give the case manager your information and they will see if they can give you assistance with your prescriptions.

## 2017-05-28 NOTE — ED Notes (Signed)
Pt expressing concerns for antibiotics for amputated finger. Pt unable to afford $60 for bactrim and percocet medications. Requesting help with prescriptions and a dose of both medications; provider made aware.

## 2017-05-28 NOTE — Progress Notes (Unsigned)
[facility]  Pt states he is unable to purchase them.  EDCM advised that MATCH will cover abx ONLY and not narcotic.  Pt verbalized understanding.

## 2017-05-28 NOTE — ED Triage Notes (Signed)
BIB [facility] from home for leaking colostomy bag, seen here multiple times for same.

## 2017-05-29 ENCOUNTER — Encounter (HOSPITAL_COMMUNITY): Payer: Self-pay | Admitting: Emergency Medicine

## 2017-05-29 ENCOUNTER — Other Ambulatory Visit: Payer: Self-pay

## 2017-05-29 ENCOUNTER — Emergency Department (HOSPITAL_COMMUNITY)
Admission: EM | Admit: 2017-05-29 | Discharge: 2017-05-29 | Disposition: A | Discharge status: Home / Self Care | Payer: Medicaid Other | Attending: Emergency Medicine | Admitting: Emergency Medicine

## 2017-05-29 DIAGNOSIS — Z433 Encounter for attention to colostomy: Secondary | ICD-10-CM | POA: Diagnosis not present

## 2017-05-29 DIAGNOSIS — Z89021 Acquired absence of right finger(s): Secondary | ICD-10-CM | POA: Diagnosis not present

## 2017-05-29 DIAGNOSIS — Z87891 Personal history of nicotine dependence: Secondary | ICD-10-CM | POA: Diagnosis not present

## 2017-05-29 DIAGNOSIS — Z4801 Encounter for change or removal of surgical wound dressing: Secondary | ICD-10-CM | POA: Insufficient documentation

## 2017-05-29 DIAGNOSIS — Z79899 Other long term (current) drug therapy: Secondary | ICD-10-CM | POA: Diagnosis not present

## 2017-05-29 DIAGNOSIS — Z5189 Encounter for other specified aftercare: Secondary | ICD-10-CM

## 2017-05-29 MED ORDER — ACETAMINOPHEN 500 MG PO TABS: 1000.0000 mg | ORAL_TABLET | Freq: Three times a day (TID) | ORAL | 0 refills | Status: AC

## 2017-05-29 MED ORDER — HYDROCODONE-ACETAMINOPHEN 5-325 MG PO TABS: 1.0000 | ORAL_TABLET | Freq: Three times a day (TID) | ORAL | 0 refills | Status: AC | PRN

## 2017-05-29 NOTE — ED Triage Notes (Signed)
Pt verbalizes pain and drainage from right index finger; previous ambulation. Pt continues to verbalizes waiting colostomy bags from supply person and have not come; bag leaking.

## 2017-05-29 NOTE — ED Notes (Signed)
Bed: [facility]PT1 Expected date:  Expected time:  Means of arrival:  Comments: 

## 2017-05-29 NOTE — ED Notes (Signed)
Attempted lab draw unsuccessful  

## 2017-05-29 NOTE — Care Management Note (Signed)
Case Management Note  CM consulted for medication assistance.  Pt is very well known to this CM.  CM [facility] which [facility] pt received help with his medications from the CM there.  [facility] the [facility] for [facility] assistance and all other needed outpatient medical resources.  CM spoke with pt and advised him to go to the [facility].  Updated primary RN and Dr. Eudelia Bunchardama.  No further CM needs noted at this time.

## 2017-05-30 ENCOUNTER — Encounter (HOSPITAL_COMMUNITY): Payer: Self-pay

## 2017-05-30 ENCOUNTER — Emergency Department (HOSPITAL_COMMUNITY)
Admission: EM | Admit: 2017-05-30 | Discharge: 2017-05-30 | Disposition: A | Discharge status: Home / Self Care | Payer: No Typology Code available for payment source | Attending: Emergency Medicine | Admitting: Emergency Medicine

## 2017-05-30 DIAGNOSIS — G8929 Other chronic pain: Secondary | ICD-10-CM | POA: Diagnosis not present

## 2017-05-30 DIAGNOSIS — Z87828 Personal history of other (healed) physical injury and trauma: Secondary | ICD-10-CM | POA: Diagnosis not present

## 2017-05-30 DIAGNOSIS — Z433 Encounter for attention to colostomy: Secondary | ICD-10-CM | POA: Diagnosis not present

## 2017-05-30 DIAGNOSIS — R109 Unspecified abdominal pain: Secondary | ICD-10-CM | POA: Diagnosis not present

## 2017-05-30 DIAGNOSIS — Z87891 Personal history of nicotine dependence: Secondary | ICD-10-CM | POA: Insufficient documentation

## 2017-05-30 DIAGNOSIS — Z79899 Other long term (current) drug therapy: Secondary | ICD-10-CM | POA: Diagnosis not present

## 2017-05-30 DIAGNOSIS — Z7901 Long term (current) use of anticoagulants: Secondary | ICD-10-CM | POA: Insufficient documentation

## 2017-05-30 NOTE — ED Notes (Signed)
Bed: [id number] Expected date:  Expected time:  Means of arrival:  Comments: 41 yo M  abd pain/colostomy

## 2017-05-30 NOTE — ED Triage Notes (Signed)
Patient brought in by EMS with complaints of abdominal pain with no nausea/vomiting. Patient was here this morning. Patient states his colostomy bags keep leaking and will not stay on.
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
## 2017-05-30 NOTE — ED Notes (Signed)
Patient assisted with changing colostomy bag. Discussed pt speaking with pcp about supplies, pt verbalized understanding. Given bus pass per pt request.

## 2017-05-30 NOTE — Discharge Instructions (Signed)
You need to follow-up with your primary care doctor if there is an issue with your colostomy supplies being delivered. Reserve the ED for EMERGENCIES.

## 2017-06-02 ENCOUNTER — Ambulatory Visit: Payer: No Typology Code available for payment source | Admitting: Family Medicine

## 2017-06-02 ENCOUNTER — Telehealth: Payer: Self-pay | Admitting: *Deleted

## 2017-06-02 NOTE — Telephone Encounter (Signed)
Late Entry: several attempts to reach patient. Left message on voicemail to return call.
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
## 2017-06-06 ENCOUNTER — Emergency Department (HOSPITAL_COMMUNITY)
Admission: EM | Admit: 2017-06-06 | Discharge: 2017-06-07 | Discharge status: Against Medical Advice | Payer: No Typology Code available for payment source | Source: Home / Self Care

## 2017-06-06 ENCOUNTER — Other Ambulatory Visit: Payer: Self-pay

## 2017-06-06 ENCOUNTER — Encounter (HOSPITAL_COMMUNITY): Payer: Self-pay

## 2017-06-06 ENCOUNTER — Emergency Department (HOSPITAL_COMMUNITY)
Admission: EM | Admit: 2017-06-06 | Discharge: 2017-06-06 | Disposition: A | Discharge status: Home / Self Care | Payer: No Typology Code available for payment source | Attending: Emergency Medicine | Admitting: Emergency Medicine

## 2017-06-06 ENCOUNTER — Encounter (HOSPITAL_COMMUNITY): Payer: Self-pay | Admitting: Nurse Practitioner

## 2017-06-06 DIAGNOSIS — Z433 Encounter for attention to colostomy: Secondary | ICD-10-CM | POA: Insufficient documentation

## 2017-06-06 DIAGNOSIS — Z5321 Procedure and treatment not carried out due to patient leaving prior to being seen by health care provider: Secondary | ICD-10-CM | POA: Diagnosis not present

## 2017-06-06 DIAGNOSIS — M79644 Pain in right finger(s): Secondary | ICD-10-CM | POA: Insufficient documentation

## 2017-06-06 DIAGNOSIS — Z87891 Personal history of nicotine dependence: Secondary | ICD-10-CM | POA: Diagnosis not present

## 2017-06-06 DIAGNOSIS — Z79899 Other long term (current) drug therapy: Secondary | ICD-10-CM | POA: Insufficient documentation

## 2017-06-06 DIAGNOSIS — Z7901 Long term (current) use of anticoagulants: Secondary | ICD-10-CM | POA: Diagnosis not present

## 2017-06-06 NOTE — ED Notes (Signed)
Pt is alert and oriented x 4 and verbally responsive. Pt is anxious to leave, and requesting his ostomy appliance to be changed. Nurse to perform ostomy change and pt insisted on changing it himself.

## 2017-06-06 NOTE — Discharge Instructions (Signed)
It is important that you follow up as discussed by the case manager for your regular care. You should follow up with Dr. Merlyn LotKuzma for any problems with your finger.

## 2017-06-06 NOTE — ED Triage Notes (Signed)
Pt is requesting colostomy bag supplies.
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
## 2017-06-06 NOTE — Care Management Note (Signed)
Case Management Note  CM consulted for multiple ED visits.  This pt is very well known to CM.  Pt reports he has had to re-certify with [facility] and is now waiting for them to get back to him.  Pt additionally states that he has an [facility] today at 3 pm but does not have a date scheduled for colostomy reversal surgery.  Pt reports he will not have any trouble [facility] these [facility], "I can't keep dealing with this."  Updated Damian LeavellNeese, NP.  No further CM needs noted at this time.

## 2017-06-06 NOTE — ED Triage Notes (Signed)
Patient brought in by EMS with complaints of right index finger pain, and needs a change of his colostomy bag. Patient reports his supplies "still havent come in." Patient ambulatory in triage.

## 2017-06-07 NOTE — ED Notes (Signed)
Pt was given supplies to fix his colostomy bag, pt then sts he dosent need to be seen and would like to leave so he can catch the bus. Pt given bus pass. Rn aware

## 2017-06-09 ENCOUNTER — Other Ambulatory Visit: Payer: Self-pay

## 2017-06-09 ENCOUNTER — Encounter (HOSPITAL_COMMUNITY): Payer: Self-pay

## 2017-06-09 ENCOUNTER — Emergency Department (HOSPITAL_COMMUNITY)
Admission: EM | Admit: 2017-06-09 | Discharge: 2017-06-09 | Disposition: A | Discharge status: Home / Self Care | Payer: No Typology Code available for payment source | Source: Home / Self Care | Attending: Emergency Medicine | Admitting: Emergency Medicine

## 2017-06-09 ENCOUNTER — Emergency Department (HOSPITAL_COMMUNITY)
Admission: EM | Admit: 2017-06-09 | Discharge: 2017-06-09 | Disposition: A | Discharge status: Home / Self Care | Payer: No Typology Code available for payment source | Attending: Emergency Medicine | Admitting: Emergency Medicine

## 2017-06-09 DIAGNOSIS — Z79899 Other long term (current) drug therapy: Secondary | ICD-10-CM | POA: Insufficient documentation

## 2017-06-09 DIAGNOSIS — L989 Disorder of the skin and subcutaneous tissue, unspecified: Secondary | ICD-10-CM

## 2017-06-09 DIAGNOSIS — Z5321 Procedure and treatment not carried out due to patient leaving prior to being seen by health care provider: Secondary | ICD-10-CM | POA: Diagnosis not present

## 2017-06-09 DIAGNOSIS — M79646 Pain in unspecified finger(s): Secondary | ICD-10-CM

## 2017-06-09 DIAGNOSIS — M79644 Pain in right finger(s): Secondary | ICD-10-CM | POA: Diagnosis not present

## 2017-06-09 DIAGNOSIS — Z939 Artificial opening status, unspecified: Secondary | ICD-10-CM

## 2017-06-09 DIAGNOSIS — Z87891 Personal history of nicotine dependence: Secondary | ICD-10-CM | POA: Diagnosis not present

## 2017-06-09 DIAGNOSIS — G8929 Other chronic pain: Secondary | ICD-10-CM | POA: Diagnosis not present

## 2017-06-09 DIAGNOSIS — Z7901 Long term (current) use of anticoagulants: Secondary | ICD-10-CM | POA: Diagnosis not present

## 2017-06-09 DIAGNOSIS — K9419 Other complications of enterostomy: Secondary | ICD-10-CM | POA: Insufficient documentation

## 2017-06-09 NOTE — ED Notes (Signed)
Provided pt with new colostomy bag and supplies to help him change bag.

## 2017-06-09 NOTE — ED Triage Notes (Addendum)
Patient here requesting colostomy supplies, states that his bag is busted. Also complains of right hand index finger pain from old lac in October, NAD. Patient given dressing material to apply to skin at triage

## 2017-06-09 NOTE — ED Notes (Signed)
RN asked secretary to order colostomy supplies for pt

## 2017-06-09 NOTE — Consult Note (Signed)
WOC nurse has placed [place] #s in treatment team sticky note. See previous consultation notes for any required information related to [facility] and the indigent program.   Davina PokeM. Carlyann Placide MSN, RN,CWOCN, CNS, CWON-AP [phone]

## 2017-06-09 NOTE — ED Notes (Signed)
Pt unwilling to sign for discharge paperwork.
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
## 2017-06-09 NOTE — ED Notes (Signed)
Pt called x 2 for room. 

## 2017-06-09 NOTE — ED Notes (Signed)
Patient given peanut butter and crackers, also given roll of 1 inch coban tape for finger per PA request.

## 2017-06-09 NOTE — Discharge Instructions (Signed)
Ostomy supplies are best managed and obtained through your primary care doctor, or ostomy team.  We are unable to provide extra ostomy bags in the emergency room.    This home guide that I have attached placed to both colostomies and ileostomies.  Please follow-up with the surgeon who performed your finger surgery regarding ear pain and concerns.  Please take Ibuprofen (Advil, motrin) and Tylenol (acetaminophen) to relieve your pain.  You may take up to 600 MG (3 pills) of normal strength ibuprofen every 8 hours as needed.  In between doses of ibuprofen you make take tylenol, up to 1,000 mg (two extra strength pills).  Do not take more than 3,000 mg tylenol in a 24 hour period.  Please check all medication labels as many medications such as pain and cold medications may contain tylenol.  Do not drink alcohol while taking these medications.  Do not take other NSAID'S while taking ibuprofen (such as aleve or naproxen).  Please take ibuprofen with food to decrease stomach upset.

## 2017-06-09 NOTE — ED Provider Notes (Signed)
Patient was roomed.  However, I was unable to initiate my evaluation due to patient leaving before being seen.  I did not evaluate the patient.   Dietrich PatesKhatri, Jyren Cerasoli, PA-C 06/09/17 1522    Wynetta FinesMessick, Peter C, MD 06/09/17 [phone]

## 2017-06-09 NOTE — ED Triage Notes (Signed)
Patient states he is leaking from around his colostomy and is not receiving any bags until Friday at home. Patient c/o skin being irritated where drainage has stayed on his skin.

## 2017-06-13 ENCOUNTER — Encounter (HOSPITAL_COMMUNITY): Payer: Self-pay | Admitting: Emergency Medicine

## 2017-06-13 ENCOUNTER — Emergency Department (HOSPITAL_COMMUNITY)
Admission: EM | Admit: 2017-06-13 | Discharge: 2017-06-13 | Disposition: A | Discharge status: Home / Self Care | Payer: No Typology Code available for payment source | Attending: Emergency Medicine | Admitting: Emergency Medicine

## 2017-06-13 ENCOUNTER — Other Ambulatory Visit: Payer: Self-pay

## 2017-06-13 DIAGNOSIS — Z7901 Long term (current) use of anticoagulants: Secondary | ICD-10-CM | POA: Insufficient documentation

## 2017-06-13 DIAGNOSIS — Z590 Homelessness: Secondary | ICD-10-CM | POA: Diagnosis not present

## 2017-06-13 DIAGNOSIS — G8929 Other chronic pain: Secondary | ICD-10-CM | POA: Diagnosis not present

## 2017-06-13 DIAGNOSIS — Z87891 Personal history of nicotine dependence: Secondary | ICD-10-CM | POA: Diagnosis not present

## 2017-06-13 DIAGNOSIS — Z89021 Acquired absence of right finger(s): Secondary | ICD-10-CM | POA: Insufficient documentation

## 2017-06-13 DIAGNOSIS — Z432 Encounter for attention to ileostomy: Secondary | ICD-10-CM | POA: Diagnosis present

## 2017-06-13 DIAGNOSIS — Z7189 Other specified counseling: Secondary | ICD-10-CM | POA: Insufficient documentation

## 2017-06-13 DIAGNOSIS — Z932 Ileostomy status: Secondary | ICD-10-CM | POA: Insufficient documentation

## 2017-06-13 DIAGNOSIS — Z91013 Allergy to seafood: Secondary | ICD-10-CM | POA: Insufficient documentation

## 2017-06-13 DIAGNOSIS — Z79899 Other long term (current) drug therapy: Secondary | ICD-10-CM | POA: Diagnosis not present

## 2017-06-13 NOTE — Consult Note (Signed)
WOC provided supplies [facility] numbers in previous notes. Please note. Order on A11 to materials they will send up to ED.   [facility]   2 3/4" barrier [facility] # [phone]    2 3/4" pouch [place] # [phone]    2" barrier ring.   Order patient 4 of each.  No WOC nursing needs, this is a patient who can not manage supplies as an outpatient despite multiple attempts to assist him.  Davanna He [facility] MSN,RN,CWOCN, CNS, [employer]AP [phone]

## 2017-06-13 NOTE — ED Triage Notes (Signed)
Pt here for "colostomy issues" states is supposed to get supplies today in the mail. Also requesting "iron check" - "I think my iron is low" with cramping in legs.

## 2017-06-13 NOTE — ED Notes (Signed)
Ostomy supplies arrived, pt self-placing new ostomy pouch.

## 2017-06-13 NOTE — Discharge Instructions (Signed)
Please see your medical doctor for help with your medical needs.  It is important to care for your colostomy, and change the bags when needed.
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
## 2017-06-19 ENCOUNTER — Encounter (HOSPITAL_COMMUNITY): Payer: Self-pay | Admitting: Family Medicine

## 2017-06-19 ENCOUNTER — Emergency Department (HOSPITAL_COMMUNITY)
Admission: EM | Admit: 2017-06-19 | Discharge: 2017-06-19 | Disposition: A | Discharge status: Home / Self Care | Payer: PRIVATE HEALTH INSURANCE | Attending: Emergency Medicine | Admitting: Emergency Medicine

## 2017-06-19 DIAGNOSIS — Z5321 Procedure and treatment not carried out due to patient leaving prior to being seen by health care provider: Secondary | ICD-10-CM | POA: Diagnosis not present

## 2017-06-19 DIAGNOSIS — R109 Unspecified abdominal pain: Secondary | ICD-10-CM | POA: Diagnosis present

## 2017-06-19 NOTE — ED Triage Notes (Signed)
[place] [facility]. Per EMS, he is complaining of right sided abd pain and needs colostomy supplies. [facility] Since being at [facility], he does not want to be seen but requesting colostomy supplies.

## 2017-06-19 NOTE — ED Notes (Signed)
Pt asked for colostomy supplies. Per charge RN terri, pt given supplies. Pt stated he did not wish to see a doctor because all he needed was supplies. Pt given bus pass and soda. Pt sts he will wait in lobby for bus.

## 2017-06-19 NOTE — ED Notes (Signed)
Patient continued to refuse a medical screening by a provider. I was informed by Marchelle FolksAmanda, NT that he was given colostomy supplies by Terri RN Engineer, manufacturing systems(Charge nurse) and he wanted to stay.

## 2017-06-24 ENCOUNTER — Telehealth: Payer: Self-pay | Admitting: Family Medicine

## 2017-06-24 NOTE — Telephone Encounter (Signed)
Call placed to patient #[phone] to remind him of appt and give him information about our facility. No answer. Left message asking patient to return my call at [phone].

## 2017-06-26 ENCOUNTER — Telehealth: Payer: Self-pay | Admitting: Family Medicine

## 2017-06-26 NOTE — Telephone Encounter (Signed)
Call placed to patient #[id number] to remind him of his appointment tomorrow and also to give him information about facility that helps patient with ostomy supplies. No answer. Left patient a message about appointment tomorrow.

## 2017-06-27 ENCOUNTER — Ambulatory Visit: Payer: No Typology Code available for payment source | Admitting: Family Medicine

## 2017-07-28 ENCOUNTER — Encounter: Payer: Self-pay | Admitting: Family Medicine

## 2017-07-28 ENCOUNTER — Ambulatory Visit: Payer: PRIVATE HEALTH INSURANCE | Attending: Family Medicine | Admitting: Family Medicine

## 2017-07-28 VITALS — BP 99/62 | HR 68 | Temp 97.6°F | Ht 68.0 in | Wt 177.8 lb

## 2017-07-28 DIAGNOSIS — Z881 Allergy status to other antibiotic agents status: Secondary | ICD-10-CM | POA: Insufficient documentation

## 2017-07-28 DIAGNOSIS — T148XXS Other injury of unspecified body region, sequela: Secondary | ICD-10-CM | POA: Diagnosis not present

## 2017-07-28 DIAGNOSIS — M79671 Pain in right foot: Secondary | ICD-10-CM | POA: Diagnosis present

## 2017-07-28 DIAGNOSIS — Z86718 Personal history of other venous thrombosis and embolism: Secondary | ICD-10-CM | POA: Insufficient documentation

## 2017-07-28 DIAGNOSIS — Z932 Ileostomy status: Secondary | ICD-10-CM

## 2017-07-28 DIAGNOSIS — G8921 Chronic pain due to trauma: Secondary | ICD-10-CM | POA: Insufficient documentation

## 2017-07-28 DIAGNOSIS — Z79899 Other long term (current) drug therapy: Secondary | ICD-10-CM | POA: Insufficient documentation

## 2017-07-28 DIAGNOSIS — Z7901 Long term (current) use of anticoagulants: Secondary | ICD-10-CM | POA: Diagnosis not present

## 2017-07-28 DIAGNOSIS — M21371 Foot drop, right foot: Secondary | ICD-10-CM | POA: Insufficient documentation

## 2017-07-28 DIAGNOSIS — Z933 Colostomy status: Secondary | ICD-10-CM | POA: Diagnosis not present

## 2017-07-28 DIAGNOSIS — I82511 Chronic embolism and thrombosis of right femoral vein: Secondary | ICD-10-CM

## 2017-07-28 DIAGNOSIS — M792 Neuralgia and neuritis, unspecified: Secondary | ICD-10-CM | POA: Diagnosis not present

## 2017-07-28 MED ORDER — RIVAROXABAN 20 MG PO TABS: 20.0000 mg | ORAL_TABLET | Freq: Every day | ORAL | 3 refills | Status: DC

## 2017-07-28 MED ORDER — GABAPENTIN 600 MG PO TABS: 600.0000 mg | ORAL_TABLET | Freq: Three times a day (TID) | ORAL | 3 refills | Status: DC

## 2017-07-28 MED ORDER — DULOXETINE HCL 60 MG PO CPEP: 60.0000 mg | ORAL_CAPSULE | Freq: Every day | ORAL | 3 refills | Status: DC
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
## 2017-07-29 ENCOUNTER — Encounter: Payer: Self-pay | Admitting: Family Medicine

## 2017-08-30 ENCOUNTER — Emergency Department (HOSPITAL_COMMUNITY)
Admission: EM | Admit: 2017-08-30 | Discharge: 2017-08-30 | Disposition: A | Discharge status: Home / Self Care | Payer: Medicaid Other

## 2017-08-30 NOTE — ED Notes (Signed)
Pt did not need seen.  Just needed help with colostomy device

## 2017-09-04 ENCOUNTER — Emergency Department (HOSPITAL_COMMUNITY)
Admission: EM | Admit: 2017-09-04 | Discharge: 2017-09-04 | Discharge status: Absent without leave | Payer: Medicaid Other

## 2017-09-04 ENCOUNTER — Telehealth: Payer: Self-pay | Admitting: *Deleted

## 2017-09-04 NOTE — Telephone Encounter (Signed)
[name] [facility].  EDCM contacted wound care RN for number [phone] option [phone] and relayed information to [place].  No further EDCM needs identified at this time.  Marvie Calender J. Lucretia RoersWood, RN, BSN, [place] [phone]

## 2017-09-10 ENCOUNTER — Telehealth: Payer: Self-pay | Admitting: Family Medicine

## 2017-09-10 NOTE — Telephone Encounter (Signed)
[facility], called patient and requested for a refill on colonoscopy bags. [facility] CB# [phone]. Patient couldn't remember [facility] workers name.

## 2017-09-11 ENCOUNTER — Other Ambulatory Visit: Payer: Self-pay

## 2017-09-11 NOTE — Telephone Encounter (Signed)
Okay to refill? 

## 2017-09-11 NOTE — Telephone Encounter (Signed)
Will route to PCP 

## 2017-09-11 NOTE — Telephone Encounter (Signed)
[facility] was called and given the verbal order to refill patients supplies. [facility] will fax over script for PCP to sign.

## 2017-10-04 ENCOUNTER — Emergency Department (HOSPITAL_COMMUNITY)
Admission: EM | Admit: 2017-10-04 | Discharge: 2017-10-04 | Discharge status: Against Medical Advice | Payer: PRIVATE HEALTH INSURANCE

## 2017-10-15 ENCOUNTER — Emergency Department (HOSPITAL_COMMUNITY)
Admission: EM | Admit: 2017-10-15 | Discharge: 2017-10-15 | Disposition: A | Discharge status: Home / Self Care | Payer: PRIVATE HEALTH INSURANCE | Attending: Emergency Medicine | Admitting: Emergency Medicine

## 2017-10-15 ENCOUNTER — Other Ambulatory Visit: Payer: Self-pay

## 2017-10-15 ENCOUNTER — Encounter (HOSPITAL_COMMUNITY): Payer: Self-pay | Admitting: Emergency Medicine

## 2017-10-15 DIAGNOSIS — Y828 Other medical devices associated with adverse incidents: Secondary | ICD-10-CM | POA: Diagnosis not present

## 2017-10-15 DIAGNOSIS — Z9049 Acquired absence of other specified parts of digestive tract: Secondary | ICD-10-CM | POA: Diagnosis not present

## 2017-10-15 DIAGNOSIS — Z87891 Personal history of nicotine dependence: Secondary | ICD-10-CM | POA: Insufficient documentation

## 2017-10-15 DIAGNOSIS — T83038A Leakage of other indwelling urethral catheter, initial encounter: Secondary | ICD-10-CM | POA: Diagnosis present

## 2017-10-15 DIAGNOSIS — Z7189 Other specified counseling: Secondary | ICD-10-CM | POA: Diagnosis not present

## 2017-10-15 DIAGNOSIS — Z932 Ileostomy status: Secondary | ICD-10-CM | POA: Insufficient documentation

## 2017-10-15 DIAGNOSIS — Z79899 Other long term (current) drug therapy: Secondary | ICD-10-CM | POA: Insufficient documentation

## 2017-10-15 DIAGNOSIS — Z432 Encounter for attention to ileostomy: Secondary | ICD-10-CM | POA: Diagnosis not present

## 2017-10-15 NOTE — Discharge Instructions (Signed)
Please return for any abdominal pain, decreased output from the ostomy, or redness around the ostomy site.  It is very important that you follow-up with your primary care provider to make sure there are no lapses in your supplies.  The most optimal place to get her supplies would be a primary care provider's office, who completes the recertification's.
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
## 2017-10-15 NOTE — ED Triage Notes (Signed)
Pt requesting ostomy supplies. Was given a bag with morning from Gillis EndsPatty Dowd charge RN "but it came off." Pt verbalizes out of supply until Friday.

## 2017-10-15 NOTE — ED Notes (Signed)
Patient's ostomy dressed with 2-piece ostomy set. Patient provided with 1 extra ostomy bag. Patient requested water, a sandwich, benzoin tincture, and mepore tape. Patient was provided with all of the above.

## 2017-10-22 ENCOUNTER — Telehealth: Payer: Self-pay | Admitting: Family Medicine

## 2017-10-22 NOTE — Telephone Encounter (Signed)
[facility] Raymond Sheldon([name]) called to get verbal orders for Ostomy supplies please call her back at [phone] ext [phone] (Raymond Bowen), please follow up

## 2017-10-22 NOTE — Telephone Encounter (Signed)
Raymond Bowen was called and she informed me that she used the verbal order that was given in feb.

## 2017-10-25 ENCOUNTER — Encounter (HOSPITAL_COMMUNITY): Payer: Self-pay | Admitting: *Deleted

## 2017-10-25 ENCOUNTER — Other Ambulatory Visit: Payer: Self-pay

## 2017-10-25 DIAGNOSIS — Z7189 Other specified counseling: Secondary | ICD-10-CM | POA: Insufficient documentation

## 2017-10-25 DIAGNOSIS — Z7901 Long term (current) use of anticoagulants: Secondary | ICD-10-CM | POA: Insufficient documentation

## 2017-10-25 DIAGNOSIS — M79671 Pain in right foot: Secondary | ICD-10-CM | POA: Diagnosis not present

## 2017-10-25 DIAGNOSIS — Z87891 Personal history of nicotine dependence: Secondary | ICD-10-CM | POA: Insufficient documentation

## 2017-10-25 DIAGNOSIS — Z933 Colostomy status: Secondary | ICD-10-CM | POA: Diagnosis not present

## 2017-10-25 DIAGNOSIS — G8929 Other chronic pain: Secondary | ICD-10-CM | POA: Diagnosis not present

## 2017-10-25 DIAGNOSIS — Z439 Encounter for attention to unspecified artificial opening: Secondary | ICD-10-CM | POA: Diagnosis not present

## 2017-10-25 DIAGNOSIS — Z79899 Other long term (current) drug therapy: Secondary | ICD-10-CM | POA: Insufficient documentation

## 2017-10-25 NOTE — ED Triage Notes (Signed)
The pt is here for colostomy bags and he has a rt foot pain since 2016

## 2017-10-26 ENCOUNTER — Emergency Department (HOSPITAL_COMMUNITY)
Admission: EM | Admit: 2017-10-26 | Discharge: 2017-10-26 | Disposition: A | Discharge status: Home / Self Care | Payer: PRIVATE HEALTH INSURANCE | Attending: Emergency Medicine | Admitting: Emergency Medicine

## 2017-10-26 ENCOUNTER — Emergency Department (HOSPITAL_COMMUNITY)
Admission: EM | Admit: 2017-10-26 | Discharge: 2017-10-27 | Disposition: A | Discharge status: Home / Self Care | Payer: PRIVATE HEALTH INSURANCE | Source: Home / Self Care | Attending: Emergency Medicine | Admitting: Emergency Medicine

## 2017-10-26 ENCOUNTER — Encounter (HOSPITAL_COMMUNITY): Payer: Self-pay

## 2017-10-26 ENCOUNTER — Other Ambulatory Visit: Payer: Self-pay

## 2017-10-26 DIAGNOSIS — M79671 Pain in right foot: Secondary | ICD-10-CM

## 2017-10-26 DIAGNOSIS — Z7189 Other specified counseling: Secondary | ICD-10-CM

## 2017-10-26 DIAGNOSIS — G8929 Other chronic pain: Secondary | ICD-10-CM

## 2017-10-26 DIAGNOSIS — Z433 Encounter for attention to colostomy: Secondary | ICD-10-CM

## 2017-10-26 DIAGNOSIS — Z87891 Personal history of nicotine dependence: Secondary | ICD-10-CM | POA: Insufficient documentation

## 2017-10-26 DIAGNOSIS — Z7901 Long term (current) use of anticoagulants: Secondary | ICD-10-CM

## 2017-10-26 DIAGNOSIS — Z79899 Other long term (current) drug therapy: Secondary | ICD-10-CM

## 2017-10-26 DIAGNOSIS — Z933 Colostomy status: Secondary | ICD-10-CM | POA: Diagnosis not present

## 2017-10-26 MED ORDER — ACETAMINOPHEN 325 MG PO TABS
650.0000 mg | ORAL_TABLET | Freq: Once | ORAL | Status: AC
  Administered 2017-10-26: 650 mg via ORAL
  Filled 2017-10-26: qty 2

## 2017-10-26 NOTE — ED Notes (Signed)
A-11 for colostomy bags submitted.
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
## 2017-10-26 NOTE — ED Notes (Signed)
Pt given supplies for colostomy bag change.

## 2017-10-26 NOTE — ED Triage Notes (Signed)
[facility] for same colostomy leaking, pain in right foot.

## 2017-10-27 ENCOUNTER — Encounter: Payer: Self-pay | Admitting: Family Medicine

## 2017-10-27 ENCOUNTER — Telehealth: Payer: Self-pay

## 2017-10-27 ENCOUNTER — Ambulatory Visit: Payer: Medicare Other | Attending: Family Medicine | Admitting: Family Medicine

## 2017-10-27 VITALS — BP 101/67 | HR 115 | Temp 98.4°F | Ht 68.0 in | Wt 177.8 lb

## 2017-10-27 DIAGNOSIS — M792 Neuralgia and neuritis, unspecified: Secondary | ICD-10-CM | POA: Diagnosis not present

## 2017-10-27 DIAGNOSIS — M79671 Pain in right foot: Secondary | ICD-10-CM | POA: Diagnosis not present

## 2017-10-27 DIAGNOSIS — G629 Polyneuropathy, unspecified: Secondary | ICD-10-CM | POA: Insufficient documentation

## 2017-10-27 DIAGNOSIS — M21371 Foot drop, right foot: Secondary | ICD-10-CM | POA: Diagnosis not present

## 2017-10-27 DIAGNOSIS — Z932 Ileostomy status: Secondary | ICD-10-CM | POA: Diagnosis not present

## 2017-10-27 DIAGNOSIS — I82511 Chronic embolism and thrombosis of right femoral vein: Secondary | ICD-10-CM

## 2017-10-27 DIAGNOSIS — Z79899 Other long term (current) drug therapy: Secondary | ICD-10-CM | POA: Diagnosis not present

## 2017-10-27 DIAGNOSIS — Z7901 Long term (current) use of anticoagulants: Secondary | ICD-10-CM | POA: Diagnosis not present

## 2017-10-27 MED ORDER — IBUPROFEN 800 MG PO TABS
800.0000 mg | ORAL_TABLET | Freq: Once | ORAL | Status: AC
  Administered 2017-10-27: 800 mg via ORAL
  Filled 2017-10-27: qty 1

## 2017-10-27 MED ORDER — RIVAROXABAN 20 MG PO TABS: 20.0000 mg | ORAL_TABLET | Freq: Every day | ORAL | 3 refills | Status: DC

## 2017-10-27 MED ORDER — DULOXETINE HCL 60 MG PO CPEP: 60.0000 mg | ORAL_CAPSULE | Freq: Every day | ORAL | 3 refills | Status: DC

## 2017-10-27 MED ORDER — METHOCARBAMOL 500 MG PO TABS: 500.0000 mg | ORAL_TABLET | Freq: Three times a day (TID) | ORAL | 3 refills | Status: DC | PRN

## 2017-10-27 MED ORDER — GABAPENTIN 800 MG PO TABS: 800.0000 mg | ORAL_TABLET | Freq: Three times a day (TID) | ORAL | 1 refills | Status: DC

## 2017-10-27 NOTE — Telephone Encounter (Signed)
Met with the patient when he was in the clinic today. He stated that he has an appointment at [facility] with the surgeon on 11/03/17 to schedule an appointment for an ostomy reversal. He now has medicaid transportation. Reminded him that medicaid will provide transportation to [facility].  He stated that he was aware that they provide transportation but he already has a ride to take him to the appointment.
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
## 2017-10-27 NOTE — Discharge Instructions (Signed)
Follow-up with the wellness clinic tomorrow for your supplies. Talk to them about your medications for your pain. Return here for new concerns.

## 2017-10-27 NOTE — Telephone Encounter (Signed)
Addendum to prior note regarding meeting with the patient today. He was inquiring about obtaining ostomy supplies at the clinic. Informed him that this clinic does not have ostomy bags/supplies in stock and has not ordered supplies for him. He was given dressing materials to re-enforce the current ostomy bag wafer. He then explained that he has ordered 3 months of ostomy bags from [facility] that are to be delivered tonight by 2000. He said that he just confirmed this with [facility] while in the clinic. He stated that he is no longer working with [name] and he now has medicaid to cover the cost of the supplies.
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
## 2017-10-28 ENCOUNTER — Telehealth: Payer: Self-pay

## 2017-10-28 ENCOUNTER — Other Ambulatory Visit: Payer: Self-pay | Admitting: Family Medicine

## 2017-10-28 ENCOUNTER — Other Ambulatory Visit: Payer: Self-pay

## 2017-10-28 DIAGNOSIS — N183 Chronic kidney disease, stage 3 unspecified: Secondary | ICD-10-CM

## 2017-10-28 DIAGNOSIS — I82511 Chronic embolism and thrombosis of right femoral vein: Secondary | ICD-10-CM

## 2017-10-28 DIAGNOSIS — M792 Neuralgia and neuritis, unspecified: Secondary | ICD-10-CM

## 2017-10-28 LAB — CMP14+EGFR
ALT: 38 IU/L (ref 0–44)
AST: 30 IU/L (ref 0–40)
Albumin/Globulin Ratio: 1.3 (ref 1.2–2.2)
Albumin: 4.4 g/dL (ref 3.5–5.5)
Alkaline Phosphatase: 84 IU/L (ref 39–117)
BUN/Creatinine Ratio: 12 (ref 9–20)
BUN: 24 mg/dL — ABNORMAL HIGH (ref 6–20)
Bilirubin Total: <0.2 mg/dL (ref 0.0–1.2)
CO2: 17 mmol/L — ABNORMAL LOW (ref 20–29)
Calcium: 9.8 mg/dL (ref 8.7–10.2)
Chloride: 104 mmol/L (ref 96–106)
Creatinine, Ser: 2.05 mg/dL — ABNORMAL HIGH (ref 0.76–1.27)
GFR calc Af Amer: 46 mL/min/{1.73_m2} — ABNORMAL LOW (ref >59)
GFR calc non Af Amer: 40 mL/min/{1.73_m2} — ABNORMAL LOW (ref >59)
Globulin, Total: 3.3 g/dL (ref 1.5–4.5)
Glucose: 73 mg/dL (ref 65–99)
Potassium: 3.8 mmol/L (ref 3.5–5.2)
Sodium: 141 mmol/L (ref 134–144)
Total Protein: 7.7 g/dL (ref 6.0–8.5)

## 2017-10-28 MED ORDER — GABAPENTIN 800 MG PO TABS: 800.0000 mg | ORAL_TABLET | Freq: Three times a day (TID) | ORAL | 1 refills | Status: DC

## 2017-10-28 MED ORDER — RIVAROXABAN 15 MG PO TABS: 15.0000 mg | ORAL_TABLET | Freq: Every day | ORAL | 3 refills | Status: DC

## 2017-10-28 MED ORDER — METHOCARBAMOL 500 MG PO TABS: 500.0000 mg | ORAL_TABLET | Freq: Three times a day (TID) | ORAL | 3 refills | Status: DC | PRN

## 2017-10-28 MED ORDER — DULOXETINE HCL 60 MG PO CPEP: 60.0000 mg | ORAL_CAPSULE | Freq: Every day | ORAL | 3 refills | Status: DC

## 2017-10-28 NOTE — Telephone Encounter (Signed)
Patient was called and informed of lab results. Patient was also informed to decrease Xarelto to 15mg .

## 2017-11-12 MED FILL — GABAPENTIN 800 MG TABLET: 800 | 30 days supply | Qty: 90 | Fill #0

## 2017-11-12 MED FILL — DULoxetine HCL 60 MG CPEP: 60 | 30 days supply | Qty: 30 | Fill #0

## 2017-11-12 MED FILL — METHOCARBAMOL 500 MG TABS: 500 | 30 days supply | Qty: 90 | Fill #0

## 2017-11-18 ENCOUNTER — Other Ambulatory Visit: Payer: Self-pay

## 2017-11-18 ENCOUNTER — Emergency Department (HOSPITAL_COMMUNITY)
Admission: EM | Admit: 2017-11-18 | Discharge: 2017-11-18 | Disposition: A | Discharge status: Home / Self Care | Payer: Medicare Other | Attending: Emergency Medicine | Admitting: Emergency Medicine

## 2017-11-18 ENCOUNTER — Encounter (HOSPITAL_COMMUNITY): Payer: Self-pay | Admitting: Emergency Medicine

## 2017-11-18 DIAGNOSIS — Z433 Encounter for attention to colostomy: Secondary | ICD-10-CM | POA: Diagnosis not present

## 2017-11-18 DIAGNOSIS — Z932 Ileostomy status: Secondary | ICD-10-CM | POA: Diagnosis not present

## 2017-11-18 DIAGNOSIS — K439 Ventral hernia without obstruction or gangrene: Secondary | ICD-10-CM | POA: Diagnosis not present

## 2017-11-18 DIAGNOSIS — K9403 Colostomy malfunction: Secondary | ICD-10-CM | POA: Diagnosis not present

## 2017-11-18 NOTE — ED Notes (Signed)
A11 form sent to materials for a new ostomy bag
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
## 2017-11-18 NOTE — ED Triage Notes (Signed)
Pt states he just saw Dr. Lindie Spruce today and had his colostomy check. After seeing him, his bag continued to leak. Pt states he is here for new colostomy bag. His replacement bags will not be here until Wednesday per pt. Requesting to be seen in fast track

## 2017-11-18 NOTE — ED Notes (Signed)
Walked into patient's room to give him a sandwich and patient was going through supply drawer.  Patient states he was "getting something to help his bag stick"

## 2017-11-18 NOTE — Discharge Instructions (Addendum)
It is important that you are changing your colostomy bag at home.  You cannot keep coming to the ER for colostomy bag change. Follow-up with your primary care doctor for any further needs for your colostomy bag. Return to the emergency room if you develop fevers, abdominal pain, vomiting, or any new or concerning symptoms.

## 2017-11-19 ENCOUNTER — Encounter (HOSPITAL_COMMUNITY): Payer: Self-pay

## 2017-11-19 ENCOUNTER — Emergency Department (HOSPITAL_COMMUNITY)
Admission: EM | Admit: 2017-11-19 | Discharge: 2017-11-19 | Disposition: A | Discharge status: Home / Self Care | Payer: Medicare Other | Attending: Emergency Medicine | Admitting: Emergency Medicine

## 2017-11-19 ENCOUNTER — Other Ambulatory Visit: Payer: Self-pay | Admitting: General Surgery

## 2017-11-19 ENCOUNTER — Emergency Department (HOSPITAL_COMMUNITY)
Admission: EM | Admit: 2017-11-19 | Discharge: 2017-11-19 | Disposition: A | Discharge status: Home / Self Care | Payer: Medicare Other | Source: Home / Self Care | Attending: Emergency Medicine | Admitting: Emergency Medicine

## 2017-11-19 ENCOUNTER — Other Ambulatory Visit: Payer: Self-pay

## 2017-11-19 DIAGNOSIS — R21 Rash and other nonspecific skin eruption: Secondary | ICD-10-CM | POA: Insufficient documentation

## 2017-11-19 DIAGNOSIS — Z433 Encounter for attention to colostomy: Secondary | ICD-10-CM | POA: Diagnosis not present

## 2017-11-19 DIAGNOSIS — Z932 Ileostomy status: Secondary | ICD-10-CM

## 2017-11-19 DIAGNOSIS — Z5321 Procedure and treatment not carried out due to patient leaving prior to being seen by health care provider: Secondary | ICD-10-CM | POA: Diagnosis not present

## 2017-11-19 DIAGNOSIS — Z7189 Other specified counseling: Secondary | ICD-10-CM
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
## 2017-11-19 NOTE — ED Notes (Signed)
Patient left without being seen.

## 2017-11-19 NOTE — ED Triage Notes (Addendum)
Pt endorses colostomy bag leakage x 2 days with irritation/redness around colostomy site. Pt went to [facility] this morning and new bag was placed and given, then pt came here. VSS

## 2017-11-19 NOTE — ED Notes (Signed)
Took off old colostomy & placed new one. Patient states this is his only need.

## 2017-11-19 NOTE — ED Notes (Signed)
Wound/Ostomy RN at bedside

## 2017-11-19 NOTE — ED Provider Notes (Signed)
No one in room   Devoria Albe, MD 11/19/17 0425

## 2017-11-19 NOTE — Discharge Instructions (Signed)
Please follow up with your primary care provider within 5-7 days for re-evaluation of your symptoms. If you do not have a primary care provider, information for a healthcare clinic has been provided for you to make arrangements for follow up care.  Please return to the emergency room immediately if you experience any new or worsening symptoms or any symptoms that indicate worsening infection such as fevers, increased redness/swelling/pain, warmth, or drainage from the affected area.    

## 2017-11-19 NOTE — Consult Note (Signed)
WOC Nurse ostomy consult note  Patient well known to this WOC nurse.  Patient reports he is out of ostomy supplies and that his current two pc. System has been working for 2 days for the last while. However he went in to see surgeon last week (Dr. Lindie Spruce) for evaluation for take down surgery and repair of his hernia. When Dr. Lindie Spruce was manipulating his hernia it caused his pouch to start leaking.  He did not have any pouches to replace this and has been in and out of the ED since to get pouches, but his skin was damaged in the interim of this.  Today he reports his supplies will not be here until tomorrow.  Stoma type/location: R lateral abdomen almost on patients flank area Stomal assessment/size: 1 " flat stoma Peristomal assessment: large hernia midline with new peristomal hernia. He has denudation noted  From 2cm-6 oclock that would correspond to the leakage he reports after the attempted reductions of his hernia in Dr. Dixon Boos office.  Skin is weeping and raw Treatment options for stomal/peristomal skin: crusted area with ostomy powder and tap water Output liquid green/yellow.  Discuss again with patient the use of Imodium, but he can not afford this. This would decrease his high output from his stoma.  Ostomy pouching: 2pc. 2 1/4" with barrier ring for added convexity  In discussions today with patient he is aware that he will not be able to have reversal surgery at [facility] now, he will not stop smoking for this surgery and they will not perform with blood levels of nicotine. He has recently been approved for [facility] [place] [facility] and now has recently been see by his trauma surgeon that original treated him in 2016.  He reports Dr. Lindie Spruce wants to get a CT of abdomen to make the decision about his reversal surgery and hernia repair.  I have verified in [facility] that he has had a CT scan ordered by Dr. Lindie Spruce and the patient reports that he was given the oral contrast to take.  He reports he has not heard back on the CT scan.    I have contacted [facility] today and assisted the patient with scheduling the CT scan on Friday 11/21/17 ar 5:20 pm.  I have provided the patient with written instructions that he read back to me.  He is in agreement to proceed with CT scan.  I have offered encouragement today for this patient to make sure and follow up on this appointment. This will be the only way to have surgical takedown and repair of his hernia can could change his life.  His ostomy has been a substantial burden to the patient and to the health care system.   I have provided the patient with a bag of supplies, some of which are not the normal supplies he uses but it should get him through a few days if he is really able to pick up is routine supplies tomorrow as he reports.   Reported above to PA and bedside nurse.  Kourtnie Sachs [facility], CNS, [employer] [phone]

## 2017-11-19 NOTE — ED Triage Notes (Signed)
Pt checks in the ED for colostomy supplies, it was just changed yesterday but it still leaking and not adhering to him Pt's supplies do not come until Thursday

## 2017-11-21 ENCOUNTER — Ambulatory Visit
Admit: 2017-11-21 | Discharge: 2017-11-21 | Disposition: A | Discharge status: Home / Self Care | Payer: PRIVATE HEALTH INSURANCE | Attending: General Surgery | Admitting: General Surgery

## 2017-11-21 DIAGNOSIS — Z932 Ileostomy status: Secondary | ICD-10-CM

## 2017-11-21 DIAGNOSIS — N200 Calculus of kidney: Secondary | ICD-10-CM | POA: Diagnosis not present

## 2017-12-09 ENCOUNTER — Emergency Department (HOSPITAL_COMMUNITY): Payer: Medicare Other

## 2017-12-09 ENCOUNTER — Other Ambulatory Visit (HOSPITAL_COMMUNITY): Payer: Medicare Other

## 2017-12-09 ENCOUNTER — Emergency Department (HOSPITAL_COMMUNITY)
Admission: EM | Admit: 2017-12-09 | Discharge: 2017-12-09 | Disposition: A | Discharge status: Home / Self Care | Payer: Medicare Other | Attending: Emergency Medicine | Admitting: Emergency Medicine

## 2017-12-09 ENCOUNTER — Encounter (HOSPITAL_COMMUNITY): Payer: Self-pay | Admitting: Emergency Medicine

## 2017-12-09 DIAGNOSIS — Z87891 Personal history of nicotine dependence: Secondary | ICD-10-CM | POA: Insufficient documentation

## 2017-12-09 DIAGNOSIS — Z79899 Other long term (current) drug therapy: Secondary | ICD-10-CM | POA: Diagnosis not present

## 2017-12-09 DIAGNOSIS — R1032 Left lower quadrant pain: Secondary | ICD-10-CM | POA: Diagnosis present

## 2017-12-09 DIAGNOSIS — Z932 Ileostomy status: Secondary | ICD-10-CM | POA: Diagnosis not present

## 2017-12-09 DIAGNOSIS — N451 Epididymitis: Secondary | ICD-10-CM | POA: Diagnosis not present

## 2017-12-09 LAB — URINALYSIS, ROUTINE W REFLEX MICROSCOPIC
Glucose, UA: NEGATIVE mg/dL
Ketones, ur: 5 mg/dL — AB
Nitrite: NEGATIVE
Protein, ur: 100 mg/dL — AB
Specific Gravity, Urine: 1.027 (ref 1.005–1.030)
WBC, UA: >50 WBC/hpf — ABNORMAL HIGH (ref 0–5)
pH: 6 (ref 5.0–8.0)

## 2017-12-09 LAB — COMPREHENSIVE METABOLIC PANEL
ALT: 26 U/L (ref 17–63)
AST: 28 U/L (ref 15–41)
Albumin: 3.8 g/dL (ref 3.5–5.0)
Alkaline Phosphatase: 96 U/L (ref 38–126)
Anion gap: 13 (ref 5–15)
BUN: 20 mg/dL (ref 6–20)
CO2: 19 mmol/L — ABNORMAL LOW (ref 22–32)
Calcium: 10.1 mg/dL (ref 8.9–10.3)
Chloride: 102 mmol/L (ref 101–111)
Creatinine, Ser: 1.97 mg/dL — ABNORMAL HIGH (ref 0.61–1.24)
GFR calc Af Amer: 48 mL/min — ABNORMAL LOW (ref >60)
GFR calc non Af Amer: 41 mL/min — ABNORMAL LOW (ref >60)
Glucose, Bld: 110 mg/dL — ABNORMAL HIGH (ref 65–99)
Potassium: 3.8 mmol/L (ref 3.5–5.1)
Sodium: 134 mmol/L — ABNORMAL LOW (ref 135–145)
Total Bilirubin: 0.6 mg/dL (ref 0.3–1.2)
Total Protein: 9.3 g/dL — ABNORMAL HIGH (ref 6.5–8.1)

## 2017-12-09 LAB — I-STAT CG4 LACTIC ACID, ED: Lactic Acid, Venous: 0.68 mmol/L (ref 0.5–1.9)

## 2017-12-09 LAB — CBC
HCT: 44.7 % (ref 39.0–52.0)
Hemoglobin: 14.7 g/dL (ref 13.0–17.0)
MCH: 32.6 pg (ref 26.0–34.0)
MCHC: 32.9 g/dL (ref 30.0–36.0)
MCV: 99.1 fL (ref 78.0–100.0)
Platelets: 300 10*3/uL (ref 150–400)
RBC: 4.51 MIL/uL (ref 4.22–5.81)
RDW: 12.9 % (ref 11.5–15.5)
WBC: 22.7 10*3/uL — ABNORMAL HIGH (ref 4.0–10.5)

## 2017-12-09 LAB — LIPASE, BLOOD: Lipase: 23 U/L (ref 11–51)

## 2017-12-09 MED ORDER — DOXYCYCLINE HYCLATE 100 MG PO CAPS: 100.0000 mg | ORAL_CAPSULE | Freq: Two times a day (BID) | ORAL | 0 refills | Status: DC

## 2017-12-09 MED ORDER — DOXYCYCLINE HYCLATE 100 MG PO TABS
100.0000 mg | ORAL_TABLET | Freq: Once | ORAL | Status: AC
  Administered 2017-12-09: 100 mg via ORAL
  Filled 2017-12-09: qty 1

## 2017-12-09 MED ORDER — CIPROFLOXACIN HCL 500 MG PO TABS: 500.0000 mg | ORAL_TABLET | Freq: Two times a day (BID) | ORAL | 0 refills | Status: DC

## 2017-12-09 MED ORDER — CIPROFLOXACIN HCL 500 MG PO TABS
500.0000 mg | ORAL_TABLET | Freq: Once | ORAL | Status: AC
  Administered 2017-12-09: 500 mg via ORAL
  Filled 2017-12-09: qty 1

## 2017-12-09 MED ORDER — CEFTRIAXONE SODIUM 250 MG IJ SOLR
250.0000 mg | Freq: Once | INTRAMUSCULAR | Status: AC
  Administered 2017-12-09: 250 mg via INTRAMUSCULAR
  Filled 2017-12-09: qty 250

## 2017-12-09 MED ORDER — HYDROMORPHONE HCL 2 MG/ML IJ SOLN
1.0000 mg | Freq: Once | INTRAMUSCULAR | Status: AC
  Administered 2017-12-09: 1 mg via INTRAVENOUS
  Filled 2017-12-09: qty 1

## 2017-12-09 MED ORDER — SODIUM CHLORIDE 0.9 % IV BOLUS: 1000.0000 mL | Freq: Once | INTRAVENOUS | Status: DC

## 2017-12-09 MED ORDER — HYDROMORPHONE HCL 2 MG/ML IJ SOLN: 1.0000 mg | Freq: Once | INTRAMUSCULAR | Status: DC

## 2017-12-09 MED ORDER — LIDOCAINE HCL (PF) 1 % IJ SOLN
INTRAMUSCULAR | Status: AC
  Filled 2017-12-09: qty 5
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
## 2017-12-09 NOTE — ED Notes (Signed)
RN attempt IV x 2, unsuccessful. Will have another RN attempt.
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
## 2017-12-09 NOTE — Discharge Instructions (Addendum)
Take both antibiotics as prescribed until all gone.  Please follow-up with family doctor for recheck, follow-up with urology if not improving.  Return if worsening symptoms

## 2017-12-09 NOTE — ED Notes (Signed)
Patient given discharge instructions and verbalized understanding.  Patient stable to discharge at this time.  Patient is alert and oriented to baseline.  No distressed noted at this time.  All belongings taken with the patient at discharge.   

## 2017-12-09 NOTE — ED Notes (Signed)
Pt called to have vitals reassessed. No answer. Will call pt again.

## 2017-12-09 NOTE — ED Notes (Signed)
PA made aware of IV difficulty, reports can give medication IM and hold off on IV for time-being. Will re-evaluate if IV fluids are needed. Still nothing to drink. HR has improved.

## 2017-12-10 LAB — GC/CHLAMYDIA PROBE AMP (~~LOC~~) NOT AT ARMC
Chlamydia: NEGATIVE
Neisseria Gonorrhea: POSITIVE — AB

## 2017-12-16 DIAGNOSIS — K439 Ventral hernia without obstruction or gangrene: Secondary | ICD-10-CM | POA: Diagnosis not present

## 2017-12-16 DIAGNOSIS — Z932 Ileostomy status: Secondary | ICD-10-CM | POA: Diagnosis not present

## 2018-01-26 ENCOUNTER — Ambulatory Visit: Payer: PRIVATE HEALTH INSURANCE | Admitting: Family Medicine

## 2018-02-25 ENCOUNTER — Ambulatory Visit: Payer: Medicare Other | Admitting: Family Medicine

## 2018-03-01 ENCOUNTER — Encounter (HOSPITAL_COMMUNITY): Payer: Self-pay | Admitting: *Deleted

## 2018-03-01 ENCOUNTER — Other Ambulatory Visit: Payer: Self-pay

## 2018-03-01 ENCOUNTER — Emergency Department (HOSPITAL_COMMUNITY)
Admission: EM | Admit: 2018-03-01 | Discharge: 2018-03-01 | Disposition: A | Discharge status: Home / Self Care | Payer: Medicare HMO | Attending: Emergency Medicine | Admitting: Emergency Medicine

## 2018-03-01 DIAGNOSIS — Z87891 Personal history of nicotine dependence: Secondary | ICD-10-CM | POA: Diagnosis not present

## 2018-03-01 DIAGNOSIS — Z939 Artificial opening status, unspecified: Secondary | ICD-10-CM

## 2018-03-01 DIAGNOSIS — Z79899 Other long term (current) drug therapy: Secondary | ICD-10-CM | POA: Diagnosis not present

## 2018-03-01 DIAGNOSIS — Z933 Colostomy status: Secondary | ICD-10-CM | POA: Diagnosis not present

## 2018-03-01 DIAGNOSIS — K9419 Other complications of enterostomy: Secondary | ICD-10-CM | POA: Diagnosis not present
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
## 2018-03-01 NOTE — ED Triage Notes (Signed)
Pt reports he just changed his ostomy bag 2 hours ago but it is leaking around the bag.

## 2018-03-02 DIAGNOSIS — Z932 Ileostomy status: Secondary | ICD-10-CM | POA: Diagnosis not present

## 2018-03-10 DIAGNOSIS — K439 Ventral hernia without obstruction or gangrene: Secondary | ICD-10-CM | POA: Diagnosis not present

## 2018-03-10 DIAGNOSIS — F17200 Nicotine dependence, unspecified, uncomplicated: Secondary | ICD-10-CM | POA: Diagnosis not present

## 2018-03-10 DIAGNOSIS — Z932 Ileostomy status: Secondary | ICD-10-CM | POA: Diagnosis not present

## 2018-03-24 ENCOUNTER — Ambulatory Visit: Payer: Medicare HMO | Attending: Family Medicine | Admitting: Family Medicine

## 2018-03-24 ENCOUNTER — Encounter: Payer: Self-pay | Admitting: Family Medicine

## 2018-03-24 VITALS — BP 107/67 | HR 84 | Temp 97.7°F | Ht 68.0 in | Wt 177.6 lb

## 2018-03-24 DIAGNOSIS — Z79899 Other long term (current) drug therapy: Secondary | ICD-10-CM | POA: Diagnosis not present

## 2018-03-24 DIAGNOSIS — I82511 Chronic embolism and thrombosis of right femoral vein: Secondary | ICD-10-CM | POA: Diagnosis not present

## 2018-03-24 DIAGNOSIS — Z9889 Other specified postprocedural states: Secondary | ICD-10-CM | POA: Insufficient documentation

## 2018-03-24 DIAGNOSIS — Z881 Allergy status to other antibiotic agents status: Secondary | ICD-10-CM | POA: Insufficient documentation

## 2018-03-24 DIAGNOSIS — M792 Neuralgia and neuritis, unspecified: Secondary | ICD-10-CM | POA: Diagnosis not present

## 2018-03-24 DIAGNOSIS — M21371 Foot drop, right foot: Secondary | ICD-10-CM | POA: Diagnosis not present

## 2018-03-24 DIAGNOSIS — Z932 Ileostomy status: Secondary | ICD-10-CM | POA: Insufficient documentation

## 2018-03-24 DIAGNOSIS — Z91013 Allergy to seafood: Secondary | ICD-10-CM | POA: Diagnosis not present

## 2018-03-24 MED ORDER — METHOCARBAMOL 500 MG PO TABS: 500.0000 mg | ORAL_TABLET | Freq: Three times a day (TID) | ORAL | 3 refills | Status: DC | PRN

## 2018-03-24 MED ORDER — RIVAROXABAN 15 MG PO TABS: 15.0000 mg | ORAL_TABLET | Freq: Every day | ORAL | 3 refills | Status: DC

## 2018-03-24 MED ORDER — PREGABALIN 100 MG PO CAPS: 100.0000 mg | ORAL_CAPSULE | Freq: Two times a day (BID) | ORAL | 3 refills | Status: DC
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
## 2018-03-27 DIAGNOSIS — Z932 Ileostomy status: Secondary | ICD-10-CM | POA: Diagnosis not present

## 2018-05-24 ENCOUNTER — Emergency Department (HOSPITAL_COMMUNITY)
Admission: EM | Admit: 2018-05-24 | Discharge: 2018-05-24 | Disposition: A | Discharge status: Other Acute Inpatient Hospital | Payer: Medicare HMO

## 2018-05-25 DIAGNOSIS — Z932 Ileostomy status: Secondary | ICD-10-CM | POA: Diagnosis not present

## 2018-06-09 DIAGNOSIS — F17200 Nicotine dependence, unspecified, uncomplicated: Secondary | ICD-10-CM | POA: Diagnosis not present

## 2018-06-09 DIAGNOSIS — K439 Ventral hernia without obstruction or gangrene: Secondary | ICD-10-CM | POA: Diagnosis not present

## 2018-06-09 DIAGNOSIS — Z932 Ileostomy status: Secondary | ICD-10-CM | POA: Diagnosis not present

## 2018-06-18 ENCOUNTER — Emergency Department (HOSPITAL_COMMUNITY)
Admission: EM | Admit: 2018-06-18 | Discharge: 2018-06-18 | Disposition: A | Discharge status: Home / Self Care | Payer: Medicare HMO | Attending: Emergency Medicine | Admitting: Emergency Medicine

## 2018-06-18 ENCOUNTER — Other Ambulatory Visit: Payer: Self-pay

## 2018-06-18 ENCOUNTER — Encounter (HOSPITAL_COMMUNITY): Payer: Self-pay | Admitting: Emergency Medicine

## 2018-06-18 DIAGNOSIS — Z89021 Acquired absence of right finger(s): Secondary | ICD-10-CM | POA: Diagnosis not present

## 2018-06-18 DIAGNOSIS — Z87891 Personal history of nicotine dependence: Secondary | ICD-10-CM | POA: Insufficient documentation

## 2018-06-18 DIAGNOSIS — Z7901 Long term (current) use of anticoagulants: Secondary | ICD-10-CM | POA: Insufficient documentation

## 2018-06-18 DIAGNOSIS — Z933 Colostomy status: Secondary | ICD-10-CM | POA: Insufficient documentation

## 2018-06-18 DIAGNOSIS — Z432 Encounter for attention to ileostomy: Secondary | ICD-10-CM | POA: Diagnosis not present

## 2018-06-18 DIAGNOSIS — Z939 Artificial opening status, unspecified: Secondary | ICD-10-CM

## 2018-06-18 DIAGNOSIS — Z79899 Other long term (current) drug therapy: Secondary | ICD-10-CM | POA: Insufficient documentation

## 2018-06-18 NOTE — ED Triage Notes (Signed)
Pt reports that his ostomy bag started leaking about an hour ago. Reports due to Thanksgiving his supplies wont be in until tomorrow. Reports burning at ostomy site.

## 2018-06-18 NOTE — ED Notes (Signed)
RN gave patient a new bag and supplies to change his ostomy at this time. Pt reports he can change it himself.
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
## 2018-06-18 NOTE — ED Notes (Signed)
Patient was disrespectful with registration due to Ileostomy bag leaking.

## 2018-06-19 ENCOUNTER — Emergency Department (HOSPITAL_COMMUNITY)
Admission: EM | Admit: 2018-06-19 | Discharge: 2018-06-19 | Disposition: A | Discharge status: Home / Self Care | Payer: Medicare HMO

## 2018-06-23 ENCOUNTER — Ambulatory Visit: Payer: Medicare HMO | Admitting: Family Medicine

## 2018-06-23 DIAGNOSIS — Z932 Ileostomy status: Secondary | ICD-10-CM | POA: Diagnosis not present

## 2018-07-16 ENCOUNTER — Ambulatory Visit: Payer: Medicare HMO | Admitting: Family Medicine

## 2018-07-27 DIAGNOSIS — Z932 Ileostomy status: Secondary | ICD-10-CM | POA: Diagnosis not present

## 2018-08-04 ENCOUNTER — Emergency Department (HOSPITAL_COMMUNITY)
Admission: EM | Admit: 2018-08-04 | Discharge: 2018-08-04 | Disposition: A | Discharge status: Home / Self Care | Payer: Medicare HMO | Attending: Emergency Medicine | Admitting: Emergency Medicine

## 2018-08-04 ENCOUNTER — Emergency Department (HOSPITAL_COMMUNITY): Payer: Medicare HMO

## 2018-08-04 DIAGNOSIS — Z87891 Personal history of nicotine dependence: Secondary | ICD-10-CM | POA: Diagnosis not present

## 2018-08-04 DIAGNOSIS — R0789 Other chest pain: Secondary | ICD-10-CM | POA: Diagnosis not present

## 2018-08-04 DIAGNOSIS — R42 Dizziness and giddiness: Secondary | ICD-10-CM | POA: Diagnosis not present

## 2018-08-04 DIAGNOSIS — Z79899 Other long term (current) drug therapy: Secondary | ICD-10-CM | POA: Insufficient documentation

## 2018-08-04 DIAGNOSIS — Z7901 Long term (current) use of anticoagulants: Secondary | ICD-10-CM | POA: Diagnosis not present

## 2018-08-04 DIAGNOSIS — I10 Essential (primary) hypertension: Secondary | ICD-10-CM | POA: Diagnosis not present

## 2018-08-04 DIAGNOSIS — R079 Chest pain, unspecified: Secondary | ICD-10-CM | POA: Diagnosis not present

## 2018-08-04 DIAGNOSIS — R0602 Shortness of breath: Secondary | ICD-10-CM | POA: Diagnosis not present

## 2018-08-04 LAB — BASIC METABOLIC PANEL
Anion gap: 12 (ref 5–15)
BUN: 20 mg/dL (ref 6–20)
CO2: 17 mmol/L — ABNORMAL LOW (ref 22–32)
Calcium: 8.8 mg/dL — ABNORMAL LOW (ref 8.9–10.3)
Chloride: 106 mmol/L (ref 98–111)
Creatinine, Ser: 2.09 mg/dL — ABNORMAL HIGH (ref 0.61–1.24)
GFR calc Af Amer: 45 mL/min — ABNORMAL LOW (ref >60)
GFR calc non Af Amer: 38 mL/min — ABNORMAL LOW (ref >60)
Glucose, Bld: 93 mg/dL (ref 70–99)
Potassium: 3.1 mmol/L — ABNORMAL LOW (ref 3.5–5.1)
Sodium: 135 mmol/L (ref 135–145)

## 2018-08-04 LAB — CBC
HCT: 37.9 % — ABNORMAL LOW (ref 39.0–52.0)
Hemoglobin: 12 g/dL — ABNORMAL LOW (ref 13.0–17.0)
MCH: 32.6 pg (ref 26.0–34.0)
MCHC: 31.7 g/dL (ref 30.0–36.0)
MCV: 103 fL — ABNORMAL HIGH (ref 80.0–100.0)
Platelets: 236 10*3/uL (ref 150–400)
RBC: 3.68 MIL/uL — ABNORMAL LOW (ref 4.22–5.81)
RDW: 13.7 % (ref 11.5–15.5)
WBC: 16.8 10*3/uL — ABNORMAL HIGH (ref 4.0–10.5)
nRBC: 0 % (ref 0.0–0.2)

## 2018-08-04 LAB — I-STAT TROPONIN, ED: Troponin i, poc: 0.01 ng/mL (ref 0.00–0.08)

## 2018-08-04 MED ORDER — NAPROXEN 500 MG PO TABS: 500.0000 mg | ORAL_TABLET | Freq: Two times a day (BID) | ORAL | 0 refills | Status: DC

## 2018-08-04 MED ORDER — KETOROLAC TROMETHAMINE 30 MG/ML IJ SOLN
60.0000 mg | Freq: Once | INTRAMUSCULAR | Status: AC
  Administered 2018-08-04: 60 mg via INTRAMUSCULAR
  Filled 2018-08-04: qty 2
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
## 2018-08-04 NOTE — ED Triage Notes (Signed)
BIB EMS from streets, pt presents with multiple complaints. Reports CP, dizziness, SOB X 1-2 hrs.

## 2018-08-04 NOTE — ED Notes (Signed)
Hard to arouse in triage, pt admits to ETOH use.

## 2018-08-04 NOTE — ED Notes (Signed)
Patient verbalizes understanding of discharge instructions. Opportunity for questioning and answers were provided. Armband removed by staff, pt discharged from ED in wheelchair.  

## 2018-08-18 ENCOUNTER — Ambulatory Visit: Payer: Medicare HMO | Admitting: Family Medicine

## 2018-08-25 DIAGNOSIS — Z932 Ileostomy status: Secondary | ICD-10-CM | POA: Diagnosis not present

## 2018-09-17 ENCOUNTER — Ambulatory Visit: Payer: Medicare HMO

## 2018-09-22 ENCOUNTER — Emergency Department (HOSPITAL_COMMUNITY): Admission: EM | Admit: 2018-09-22 | Discharge: 2018-09-22 | Discharge status: Absent without leave | Payer: Medicare HMO

## 2018-09-23 ENCOUNTER — Emergency Department (HOSPITAL_COMMUNITY)
Admission: EM | Admit: 2018-09-23 | Discharge: 2018-09-23 | Disposition: A | Discharge status: Home / Self Care | Payer: Medicare HMO

## 2018-09-23 ENCOUNTER — Other Ambulatory Visit: Payer: Self-pay

## 2018-09-29 ENCOUNTER — Emergency Department (HOSPITAL_COMMUNITY)
Admission: EM | Admit: 2018-09-29 | Discharge: 2018-09-29 | Disposition: A | Discharge status: Home / Self Care | Payer: Self-pay

## 2018-09-29 ENCOUNTER — Other Ambulatory Visit: Payer: Self-pay

## 2018-09-29 ENCOUNTER — Emergency Department (HOSPITAL_COMMUNITY)
Admission: EM | Admit: 2018-09-29 | Discharge: 2018-09-29 | Disposition: A | Discharge status: Home / Self Care | Payer: Medicare Other | Attending: Emergency Medicine | Admitting: Emergency Medicine

## 2018-09-29 ENCOUNTER — Encounter (HOSPITAL_COMMUNITY): Payer: Self-pay | Admitting: Emergency Medicine

## 2018-09-29 DIAGNOSIS — Z79899 Other long term (current) drug therapy: Secondary | ICD-10-CM | POA: Diagnosis not present

## 2018-09-29 DIAGNOSIS — R1033 Periumbilical pain: Secondary | ICD-10-CM | POA: Diagnosis not present

## 2018-09-29 DIAGNOSIS — R1084 Generalized abdominal pain: Secondary | ICD-10-CM | POA: Diagnosis not present

## 2018-09-29 DIAGNOSIS — Z87891 Personal history of nicotine dependence: Secondary | ICD-10-CM | POA: Diagnosis not present

## 2018-09-29 DIAGNOSIS — R238 Other skin changes: Secondary | ICD-10-CM | POA: Diagnosis not present

## 2018-09-29 DIAGNOSIS — R52 Pain, unspecified: Secondary | ICD-10-CM | POA: Diagnosis not present

## 2018-09-29 DIAGNOSIS — R110 Nausea: Secondary | ICD-10-CM | POA: Diagnosis not present

## 2018-09-29 LAB — CBC WITH DIFFERENTIAL/PLATELET
Abs Immature Granulocytes: 0.07 10*3/uL (ref 0.00–0.07)
Basophils Absolute: 0.1 10*3/uL (ref 0.0–0.1)
Basophils Relative: 1 %
Eosinophils Absolute: 1.4 10*3/uL — ABNORMAL HIGH (ref 0.0–0.5)
Eosinophils Relative: 9 %
HCT: 44.3 % (ref 39.0–52.0)
Hemoglobin: 14.1 g/dL (ref 13.0–17.0)
Immature Granulocytes: 1 %
Lymphocytes Relative: 13 %
Lymphs Abs: 2 10*3/uL (ref 0.7–4.0)
MCH: 32.2 pg (ref 26.0–34.0)
MCHC: 31.8 g/dL (ref 30.0–36.0)
MCV: 101.1 fL — ABNORMAL HIGH (ref 80.0–100.0)
Monocytes Absolute: 1.2 10*3/uL — ABNORMAL HIGH (ref 0.1–1.0)
Monocytes Relative: 8 %
Neutro Abs: 10.1 10*3/uL — ABNORMAL HIGH (ref 1.7–7.7)
Neutrophils Relative %: 68 %
Platelets: 241 10*3/uL (ref 150–400)
RBC: 4.38 MIL/uL (ref 4.22–5.81)
RDW: 13.4 % (ref 11.5–15.5)
WBC: 14.8 10*3/uL — ABNORMAL HIGH (ref 4.0–10.5)
nRBC: 0 % (ref 0.0–0.2)

## 2018-09-29 LAB — COMPREHENSIVE METABOLIC PANEL
ALT: 20 U/L (ref 0–44)
AST: 26 U/L (ref 15–41)
Albumin: 4 g/dL (ref 3.5–5.0)
Alkaline Phosphatase: 74 U/L (ref 38–126)
Anion gap: 9 (ref 5–15)
BUN: 23 mg/dL — ABNORMAL HIGH (ref 6–20)
CO2: 19 mmol/L — ABNORMAL LOW (ref 22–32)
Calcium: 9.4 mg/dL (ref 8.9–10.3)
Chloride: 109 mmol/L (ref 98–111)
Creatinine, Ser: 2.31 mg/dL — ABNORMAL HIGH (ref 0.61–1.24)
GFR calc Af Amer: 39 mL/min — ABNORMAL LOW (ref >60)
GFR calc non Af Amer: 34 mL/min — ABNORMAL LOW (ref >60)
Glucose, Bld: 95 mg/dL (ref 70–99)
Potassium: 3.6 mmol/L (ref 3.5–5.1)
Sodium: 137 mmol/L (ref 135–145)
Total Bilirubin: 0.7 mg/dL (ref 0.3–1.2)
Total Protein: 8.1 g/dL (ref 6.5–8.1)

## 2018-09-29 LAB — LIPASE, BLOOD: Lipase: 34 U/L (ref 11–51)

## 2018-09-29 MED ORDER — ONDANSETRON HCL 4 MG/2ML IJ SOLN
4.0000 mg | Freq: Once | INTRAMUSCULAR | Status: AC
  Administered 2018-09-29: 4 mg via INTRAVENOUS
  Filled 2018-09-29: qty 2

## 2018-09-29 MED ORDER — SODIUM CHLORIDE 0.9 % IV BOLUS
1000.0000 mL | Freq: Once | INTRAVENOUS | Status: AC
  Administered 2018-09-29: 1000 mL via INTRAVENOUS

## 2018-09-29 MED ORDER — ALUM & MAG HYDROXIDE-SIMETH 200-200-20 MG/5ML PO SUSP
30.0000 mL | Freq: Once | ORAL | Status: AC
  Administered 2018-09-29: 30 mL via ORAL
  Filled 2018-09-29: qty 30

## 2018-09-29 MED ORDER — DICYCLOMINE HCL 10 MG/ML IM SOLN
20.0000 mg | Freq: Once | INTRAMUSCULAR | Status: AC
  Administered 2018-09-29: 20 mg via INTRAMUSCULAR
  Filled 2018-09-29: qty 2

## 2018-09-29 NOTE — ED Triage Notes (Addendum)
Pt arriving via EMS from lower right abdominal pain. Pt has ileostomy but reports no complications with it. No fever. Abdomen is not distended.

## 2018-09-29 NOTE — ED Notes (Signed)
Bed: VP71 Expected date:  Expected time:  Means of arrival:  Comments: 43 yo M/abd pain
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
## 2018-09-29 NOTE — ED Notes (Signed)
Pt was given MSE by Dr Eudelia Bunch. Pt had same complaint as before about his colostomy bag. Pt informed by MD to go to [facility]. Pt escorted off property by security.

## 2018-09-29 NOTE — ED Notes (Signed)
Pt intentionally pulling the tape off his ileostomy bag until it began to leak. Pt then began to complain about the bag leaking and burning. Nurse told pt to stop pulling the tape and it wouldn't leak. Pt then began to demand nurse bring him more ileostomy bags.

## 2018-09-29 NOTE — ED Notes (Signed)
Fixed tape on ileostomy. Pt still demanding nurse provide new bags stating "That's your fucking job". Explained to pt that if he had not been pulling the tape, the bag would not be leaking. Also explained to pt that per physician nurse was told to replace to tape because there is nothing wrong with the bag itself. Pt continued to cuss at nurse.

## 2018-09-29 NOTE — ED Notes (Signed)
Pt removed all monitoring equipment. 

## 2018-09-29 NOTE — ED Notes (Addendum)
Replaced ostomy bag, and provided tape and replacement supplies.  Pt unhappy with process wants to to do the bag himself/ verbalizes  he is rechecking in.
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
## 2018-10-19 ENCOUNTER — Encounter (HOSPITAL_COMMUNITY): Payer: Self-pay | Admitting: Emergency Medicine

## 2018-10-19 ENCOUNTER — Other Ambulatory Visit: Payer: Self-pay

## 2018-10-19 ENCOUNTER — Emergency Department (HOSPITAL_COMMUNITY)
Admission: EM | Admit: 2018-10-19 | Discharge: 2018-10-19 | Disposition: A | Discharge status: Home / Self Care | Payer: Medicare Other | Attending: Emergency Medicine | Admitting: Emergency Medicine

## 2018-10-19 DIAGNOSIS — Z433 Encounter for attention to colostomy: Secondary | ICD-10-CM | POA: Insufficient documentation

## 2018-10-19 DIAGNOSIS — Z79899 Other long term (current) drug therapy: Secondary | ICD-10-CM | POA: Insufficient documentation

## 2018-10-19 DIAGNOSIS — Z7901 Long term (current) use of anticoagulants: Secondary | ICD-10-CM | POA: Insufficient documentation

## 2018-10-19 DIAGNOSIS — Z87891 Personal history of nicotine dependence: Secondary | ICD-10-CM | POA: Diagnosis not present

## 2018-10-19 NOTE — ED Notes (Signed)
Bed: [facility] Expected date:  Expected time:  Means of arrival:  Comments: 
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
## 2018-10-19 NOTE — ED Notes (Signed)
Went to give patient discharge instructions but patient left without getting them.
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
## 2018-10-19 NOTE — ED Notes (Signed)
Pt given ostomy supplies by [employer].

## 2018-10-19 NOTE — Discharge Instructions (Addendum)
Obtain your supplies in an outpatient setting. The emergency department is not the appropriate place to obtain supplies.

## 2018-10-19 NOTE — ED Notes (Signed)
Pt upset due to normally he will be given supplies when comes up here without having to be seen by provider but today having to see a doctor. This RN explained to patient that unfortunaltely we cant keep handing out supplies.

## 2018-10-20 ENCOUNTER — Emergency Department (HOSPITAL_COMMUNITY)
Admission: EM | Admit: 2018-10-20 | Discharge: 2018-10-20 | Disposition: A | Discharge status: Home / Self Care | Payer: Medicare Other | Attending: Emergency Medicine | Admitting: Emergency Medicine

## 2018-10-20 ENCOUNTER — Encounter (HOSPITAL_COMMUNITY): Payer: Self-pay

## 2018-10-20 ENCOUNTER — Other Ambulatory Visit: Payer: Self-pay

## 2018-10-20 DIAGNOSIS — Z7901 Long term (current) use of anticoagulants: Secondary | ICD-10-CM | POA: Insufficient documentation

## 2018-10-20 DIAGNOSIS — Z87891 Personal history of nicotine dependence: Secondary | ICD-10-CM | POA: Insufficient documentation

## 2018-10-20 DIAGNOSIS — Z433 Encounter for attention to colostomy: Secondary | ICD-10-CM | POA: Insufficient documentation

## 2018-10-20 DIAGNOSIS — Z7189 Other specified counseling: Secondary | ICD-10-CM

## 2018-10-20 DIAGNOSIS — Z79899 Other long term (current) drug therapy: Secondary | ICD-10-CM | POA: Insufficient documentation

## 2018-10-20 DIAGNOSIS — Z933 Colostomy status: Secondary | ICD-10-CM | POA: Diagnosis not present

## 2018-10-20 NOTE — Discharge Instructions (Signed)
[email or web address]  Marland Kitchen this is the [facility] link for colostomy bags.  It is your responsibility to take care of this.  Do not come back to the ED, you will not be given any more supplies here.

## 2018-10-20 NOTE — ED Triage Notes (Signed)
Pt here after his colostomy bag broke last night and sts his supplier is unable to deliver him any d/t the coronavirus. Sts he would also like to see a wound nurse.
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
## 2018-10-20 NOTE — ED Notes (Signed)
Patient verbalizes understanding of discharge instructions. Opportunity for questioning and answers were provided. Armband removed by staff, pt discharged from ED.  

## 2018-10-20 NOTE — ED Triage Notes (Signed)
Pt states that he needs colostomy supplies and that the ones he got yesterday don't stick. He was seen at [facility] for same earlier today.
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
## 2018-10-20 NOTE — Discharge Instructions (Addendum)
I understand you are in a difficult position, but the ER is not the place to go for medical supplies. You have been told this numerous times previously. I understand you have financial constraints but, out of all of your options, an emergency room visit is likely the most expensive one. Ostomy supplies are essential for you but not an emergency. You need to plan accordingly.

## 2018-10-21 ENCOUNTER — Encounter: Payer: Self-pay | Admitting: Primary Care

## 2018-10-21 ENCOUNTER — Ambulatory Visit: Payer: Medicare Other | Attending: Primary Care | Admitting: Primary Care

## 2018-10-21 ENCOUNTER — Telehealth: Payer: Self-pay

## 2018-10-21 DIAGNOSIS — Z932 Ileostomy status: Secondary | ICD-10-CM
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
## 2018-10-21 NOTE — Telephone Encounter (Signed)
As per [facility], CMA, patient requesting ostomy bags and Adapt paste. Provided her with 3 hollister ostomy bags, 2 1/4 inch and 3 flanges for him to pick up this afternoon.  Informed her that patient can obtain the [employer] paste from [name]  Supply on [place]. Cost - $12.93, no prescription needed.

## 2018-10-21 NOTE — Telephone Encounter (Signed)
Patient spoke to [place] and his OV turned into a Tele Visit. We informed patient that we our clinic do not normally have ostomy bags.  Pt. Stated he need ostomy bags and the kit. Pt. Stated he had an order from [place] coming in on next Thursday and cannot wait for it. Pt. Stated his bag is leaking and he purchased a 120$ worth of ostomy and mistakenly chose a wrong one that irritated his skin. Our clinic was able to find some Ostomy bag that came with a base, and was able to provide patient couple of the Ostomy bag that were donated to [place], but we normally do not have ostomy bag in our clinic to give to patient daily.   Patient request adapt paste and we do not carry ostomy supplies in our clinic. Pt was given a place and the price where he can purchase a adapt paste, and stated he can't purchase it until tomorrow.  Pt. Understood that we do not supply ostomy bags or the kit. Pt was give some ostomy bag that were donated to [place] and was able to pick it up

## 2018-11-16 ENCOUNTER — Other Ambulatory Visit: Payer: Self-pay | Admitting: Family Medicine

## 2018-11-16 DIAGNOSIS — M792 Neuralgia and neuritis, unspecified: Secondary | ICD-10-CM

## 2018-11-16 DIAGNOSIS — M21371 Foot drop, right foot: Secondary | ICD-10-CM

## 2018-12-02 ENCOUNTER — Other Ambulatory Visit: Payer: Self-pay

## 2018-12-02 ENCOUNTER — Ambulatory Visit: Payer: Medicare Other | Attending: Family Medicine | Admitting: Family Medicine

## 2018-12-02 DIAGNOSIS — M792 Neuralgia and neuritis, unspecified: Secondary | ICD-10-CM | POA: Diagnosis not present

## 2018-12-02 DIAGNOSIS — M21371 Foot drop, right foot: Secondary | ICD-10-CM

## 2018-12-02 DIAGNOSIS — I82511 Chronic embolism and thrombosis of right femoral vein: Secondary | ICD-10-CM

## 2018-12-02 DIAGNOSIS — Z91199 Patient's noncompliance with other medical treatment and regimen due to unspecified reason: Secondary | ICD-10-CM

## 2018-12-02 DIAGNOSIS — Z9119 Patient's noncompliance with other medical treatment and regimen: Secondary | ICD-10-CM

## 2018-12-02 MED ORDER — PREGABALIN 100 MG PO CAPS: 100.0000 mg | ORAL_CAPSULE | Freq: Two times a day (BID) | ORAL | 3 refills | Status: DC

## 2018-12-02 MED ORDER — RIVAROXABAN 15 MG PO TABS: 15.0000 mg | ORAL_TABLET | Freq: Every day | ORAL | 3 refills | Status: DC

## 2018-12-02 MED ORDER — METHOCARBAMOL 500 MG PO TABS: 500.0000 mg | ORAL_TABLET | Freq: Three times a day (TID) | ORAL | 3 refills | Status: DC | PRN
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
## 2018-12-02 NOTE — Progress Notes (Signed)
Patient has been called and DOB has been verified. Patient has been screened and transferred to PCP to start phone visit.     

## 2019-01-23 ENCOUNTER — Emergency Department (HOSPITAL_COMMUNITY)
Admission: EM | Admit: 2019-01-23 | Discharge: 2019-01-23 | Discharge status: Absent without leave | Payer: Medicare Other

## 2019-01-24 ENCOUNTER — Emergency Department (HOSPITAL_BASED_OUTPATIENT_CLINIC_OR_DEPARTMENT_OTHER): Payer: Medicare Other

## 2019-01-24 ENCOUNTER — Other Ambulatory Visit: Payer: Self-pay

## 2019-01-24 ENCOUNTER — Encounter (HOSPITAL_COMMUNITY): Payer: Self-pay | Admitting: *Deleted

## 2019-01-24 ENCOUNTER — Emergency Department (HOSPITAL_COMMUNITY)
Admission: EM | Admit: 2019-01-24 | Discharge: 2019-01-24 | Disposition: A | Discharge status: Home / Self Care | Payer: Medicare Other | Attending: Emergency Medicine | Admitting: Emergency Medicine

## 2019-01-24 DIAGNOSIS — Z79899 Other long term (current) drug therapy: Secondary | ICD-10-CM | POA: Diagnosis not present

## 2019-01-24 DIAGNOSIS — Z433 Encounter for attention to colostomy: Secondary | ICD-10-CM | POA: Insufficient documentation

## 2019-01-24 DIAGNOSIS — Z87891 Personal history of nicotine dependence: Secondary | ICD-10-CM | POA: Diagnosis not present

## 2019-01-24 DIAGNOSIS — M79604 Pain in right leg: Secondary | ICD-10-CM | POA: Diagnosis present

## 2019-01-24 DIAGNOSIS — R609 Edema, unspecified: Secondary | ICD-10-CM

## 2019-01-24 DIAGNOSIS — I82501 Chronic embolism and thrombosis of unspecified deep veins of right lower extremity: Secondary | ICD-10-CM | POA: Insufficient documentation

## 2019-01-24 DIAGNOSIS — Z933 Colostomy status: Secondary | ICD-10-CM | POA: Diagnosis not present

## 2019-01-24 DIAGNOSIS — I82511 Chronic embolism and thrombosis of right femoral vein: Secondary | ICD-10-CM

## 2019-01-24 MED ORDER — RIVAROXABAN 15 MG PO TABS: 15.0000 mg | ORAL_TABLET | Freq: Every day | ORAL | 3 refills | Status: DC

## 2019-01-24 NOTE — ED Notes (Signed)
Pt sleeping at this time.

## 2019-01-24 NOTE — Progress Notes (Signed)
VASCULAR LAB PRELIMINARY  PRELIMINARY  PRELIMINARY  PRELIMINARY  Right lower extremity venous duplex completed.    Preliminary report:  See CV proc for preliminary results.   Gave report to Suella Broad, PA-C  Judithann Villamar, RVT 01/24/2019, 3:27 PM

## 2019-01-24 NOTE — ED Triage Notes (Addendum)
Pt states he needs colostomy supplies.  He was supposed to have new supplies on Fri, but they said it was a holiday.  Pt c/o R lower leg swelling x 1 month.  Pt was supposed to be on blood thinners, but has not been taking them.

## 2019-01-24 NOTE — ED Notes (Signed)
Happy Meal given per request.

## 2019-01-24 NOTE — ED Notes (Signed)
[place] at bedside.  Ostomy supplies arrived and are at bedside as well.

## 2019-01-24 NOTE — ED Notes (Signed)
Colostomy bags ordered.
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
## 2019-01-26 ENCOUNTER — Emergency Department (HOSPITAL_COMMUNITY)
Admission: EM | Admit: 2019-01-26 | Discharge: 2019-01-26 | Disposition: A | Discharge status: Home / Self Care | Payer: Medicare Other | Attending: Emergency Medicine | Admitting: Emergency Medicine

## 2019-01-26 ENCOUNTER — Encounter (HOSPITAL_COMMUNITY): Payer: Self-pay

## 2019-01-26 ENCOUNTER — Other Ambulatory Visit: Payer: Self-pay

## 2019-01-26 DIAGNOSIS — Z7901 Long term (current) use of anticoagulants: Secondary | ICD-10-CM | POA: Insufficient documentation

## 2019-01-26 DIAGNOSIS — Z432 Encounter for attention to ileostomy: Secondary | ICD-10-CM | POA: Insufficient documentation

## 2019-01-26 DIAGNOSIS — Z87891 Personal history of nicotine dependence: Secondary | ICD-10-CM | POA: Insufficient documentation

## 2019-01-26 NOTE — ED Notes (Signed)
Pt advised he needs to check in for supplies or go to a medical supply company

## 2019-01-26 NOTE — ED Triage Notes (Signed)
Patient states he is here for colostomy bags.
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
## 2019-01-26 NOTE — ED Notes (Signed)
Patient given ostomy supply kit from ED stock room.

## 2019-02-27 ENCOUNTER — Emergency Department (HOSPITAL_COMMUNITY)
Admission: EM | Admit: 2019-02-27 | Discharge: 2019-02-27 | Disposition: A | Discharge status: Home / Self Care | Payer: Medicare Other | Attending: Emergency Medicine | Admitting: Emergency Medicine

## 2019-02-27 ENCOUNTER — Encounter (HOSPITAL_COMMUNITY): Payer: Self-pay

## 2019-02-27 ENCOUNTER — Other Ambulatory Visit: Payer: Self-pay

## 2019-02-27 DIAGNOSIS — R5381 Other malaise: Secondary | ICD-10-CM | POA: Diagnosis not present

## 2019-02-27 DIAGNOSIS — B356 Tinea cruris: Secondary | ICD-10-CM | POA: Diagnosis not present

## 2019-02-27 DIAGNOSIS — I82501 Chronic embolism and thrombosis of unspecified deep veins of right lower extremity: Secondary | ICD-10-CM | POA: Diagnosis not present

## 2019-02-27 DIAGNOSIS — B353 Tinea pedis: Secondary | ICD-10-CM | POA: Diagnosis not present

## 2019-02-27 DIAGNOSIS — M79606 Pain in leg, unspecified: Secondary | ICD-10-CM | POA: Diagnosis not present

## 2019-02-27 DIAGNOSIS — Z79899 Other long term (current) drug therapy: Secondary | ICD-10-CM | POA: Insufficient documentation

## 2019-02-27 DIAGNOSIS — Z932 Ileostomy status: Secondary | ICD-10-CM | POA: Insufficient documentation

## 2019-02-27 DIAGNOSIS — I825Z1 Chronic embolism and thrombosis of unspecified deep veins of right distal lower extremity: Secondary | ICD-10-CM

## 2019-02-27 DIAGNOSIS — I82591 Chronic embolism and thrombosis of other specified deep vein of right lower extremity: Secondary | ICD-10-CM | POA: Insufficient documentation

## 2019-02-27 DIAGNOSIS — R2241 Localized swelling, mass and lump, right lower limb: Secondary | ICD-10-CM | POA: Diagnosis present

## 2019-02-27 DIAGNOSIS — Z87891 Personal history of nicotine dependence: Secondary | ICD-10-CM | POA: Insufficient documentation

## 2019-02-27 MED ORDER — CLOTRIMAZOLE 1 % EX CREA: TOPICAL_CREAM | CUTANEOUS | 0 refills | Status: DC
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
## 2019-02-27 NOTE — ED Notes (Signed)
Patients ileostomy emptied and new clip provided.
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
## 2019-02-27 NOTE — Discharge Instructions (Signed)
You should follow up with your doctor next week for recheck of right leg pain from chronic DVT (clots). Use the cream prescribed on your left 5th toe for skin infection.   Continue your ileostomy care at home as per your usual instructions.

## 2019-03-03 ENCOUNTER — Encounter (HOSPITAL_COMMUNITY): Payer: Self-pay | Admitting: Emergency Medicine

## 2019-03-03 ENCOUNTER — Other Ambulatory Visit: Payer: Self-pay

## 2019-03-03 ENCOUNTER — Emergency Department (HOSPITAL_COMMUNITY)
Admission: EM | Admit: 2019-03-03 | Discharge: 2019-03-03 | Disposition: A | Discharge status: Home / Self Care | Payer: Medicare Other | Attending: Emergency Medicine | Admitting: Emergency Medicine

## 2019-03-03 DIAGNOSIS — Z87891 Personal history of nicotine dependence: Secondary | ICD-10-CM | POA: Insufficient documentation

## 2019-03-03 DIAGNOSIS — Z89021 Acquired absence of right finger(s): Secondary | ICD-10-CM | POA: Diagnosis not present

## 2019-03-03 DIAGNOSIS — Z7901 Long term (current) use of anticoagulants: Secondary | ICD-10-CM | POA: Diagnosis not present

## 2019-03-03 DIAGNOSIS — Z79899 Other long term (current) drug therapy: Secondary | ICD-10-CM | POA: Diagnosis not present

## 2019-03-03 DIAGNOSIS — Z432 Encounter for attention to ileostomy: Secondary | ICD-10-CM

## 2019-03-03 DIAGNOSIS — R52 Pain, unspecified: Secondary | ICD-10-CM | POA: Diagnosis not present

## 2019-03-03 DIAGNOSIS — R58 Hemorrhage, not elsewhere classified: Secondary | ICD-10-CM | POA: Diagnosis not present

## 2019-03-03 NOTE — ED Triage Notes (Signed)
Patient complaining of colostomy bag is malfunction and leaking. Patient did not have any at home. Patient needs it change and some colostomy bags to take home.

## 2019-03-03 NOTE — ED Notes (Signed)
Supplies provided to patient to change colostomy bag. Patient denies any other needs at this time.
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
## 2019-03-03 NOTE — ED Notes (Signed)
Pt reported to PA that his supplies deliver "in the morning."

## 2019-03-03 NOTE — Discharge Instructions (Signed)
Please call your doctor for further ostomy care.

## 2019-03-23 ENCOUNTER — Encounter: Payer: Self-pay | Admitting: Family Medicine

## 2019-03-23 ENCOUNTER — Other Ambulatory Visit: Payer: Self-pay

## 2019-03-23 ENCOUNTER — Ambulatory Visit: Payer: Medicare Other | Attending: Family Medicine | Admitting: Family Medicine

## 2019-03-23 VITALS — BP 124/84 | HR 81 | Temp 98.6°F | Ht 68.0 in | Wt 185.6 lb

## 2019-03-23 DIAGNOSIS — I872 Venous insufficiency (chronic) (peripheral): Secondary | ICD-10-CM | POA: Diagnosis not present

## 2019-03-23 DIAGNOSIS — I82511 Chronic embolism and thrombosis of right femoral vein: Secondary | ICD-10-CM | POA: Diagnosis not present

## 2019-03-23 DIAGNOSIS — B353 Tinea pedis: Secondary | ICD-10-CM | POA: Diagnosis not present

## 2019-03-23 DIAGNOSIS — M21371 Foot drop, right foot: Secondary | ICD-10-CM | POA: Diagnosis not present

## 2019-03-23 DIAGNOSIS — N183 Chronic kidney disease, stage 3 unspecified: Secondary | ICD-10-CM

## 2019-03-23 DIAGNOSIS — M792 Neuralgia and neuritis, unspecified: Secondary | ICD-10-CM | POA: Diagnosis not present

## 2019-03-23 MED ORDER — METHOCARBAMOL 500 MG PO TABS: 500.0000 mg | ORAL_TABLET | Freq: Three times a day (TID) | ORAL | 3 refills | Status: DC | PRN

## 2019-03-23 MED ORDER — PREGABALIN 100 MG PO CAPS: 100.0000 mg | ORAL_CAPSULE | Freq: Two times a day (BID) | ORAL | 3 refills | Status: DC

## 2019-03-23 MED ORDER — FUROSEMIDE 20 MG PO TABS: 20.0000 mg | ORAL_TABLET | Freq: Every day | ORAL | 0 refills | Status: DC

## 2019-03-23 MED ORDER — DULOXETINE HCL 60 MG PO CPEP: 60.0000 mg | ORAL_CAPSULE | Freq: Every day | ORAL | 3 refills | Status: DC

## 2019-03-23 MED ORDER — RIVAROXABAN 15 MG PO TABS: 15.0000 mg | ORAL_TABLET | Freq: Every day | ORAL | 3 refills | Status: DC
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
## 2019-03-23 NOTE — Patient Instructions (Signed)
Edema  Edema is when you have too much fluid in your body or under your skin. Edema may make your legs, feet, and ankles swell up. Swelling is also common in looser tissues, like around your eyes. This is a common condition. It gets more common as you get older. There are many possible causes of edema. Eating too much salt (sodium) and being on your feet or sitting for a long time can cause edema in your legs, feet, and ankles. Hot weather may make edema worse. Edema is usually painless. Your skin may look swollen or shiny. Follow these instructions at home:  Keep the swollen body part raised (elevated) above the level of your heart when you are sitting or lying down.  Do not sit still or stand for a long time.  Do not wear tight clothes. Do not wear garters on your upper legs.  Exercise your legs. This can help the swelling go down.  Wear elastic bandages or support stockings as told by your doctor.  Eat a low-salt (low-sodium) diet to reduce fluid as told by your doctor.  Depending on the cause of your swelling, you may need to limit how much fluid you drink (fluid restriction).  Take over-the-counter and prescription medicines only as told by your doctor. Contact a doctor if:  Treatment is not working.  You have heart, liver, or kidney disease and have symptoms of edema.  You have sudden and unexplained weight gain. Get help right away if:  You have shortness of breath or chest pain.  You cannot breathe when you lie down.  You have pain, redness, or warmth in the swollen areas.  You have heart, liver, or kidney disease and get edema all of a sudden.  You have a fever and your symptoms get worse all of a sudden. Summary  Edema is when you have too much fluid in your body or under your skin.  Edema may make your legs, feet, and ankles swell up. Swelling is also common in looser tissues, like around your eyes.  Raise (elevate) the swollen body part above the level of your  heart when you are sitting or lying down.  Follow your doctor's instructions about diet and how much fluid you can drink (fluid restriction). This information is not intended to replace advice given to you by your health care provider. Make sure you discuss any questions you have with your health care provider. Document Released: 12/25/2007 Document Revised: 07/11/2017 Document Reviewed: 07/26/2016 Elsevier Patient Education  2020 [employer]  

## 2019-03-23 NOTE — Progress Notes (Signed)
Patient states that his right leg has swelling at time and pan.

## 2019-03-24 ENCOUNTER — Telehealth: Payer: Self-pay

## 2019-03-24 LAB — BASIC METABOLIC PANEL
BUN/Creatinine Ratio: 9 (ref 9–20)
BUN: 19 mg/dL (ref 6–24)
CO2: 20 mmol/L (ref 20–29)
Calcium: 9.6 mg/dL (ref 8.7–10.2)
Chloride: 107 mmol/L — ABNORMAL HIGH (ref 96–106)
Creatinine, Ser: 2.18 mg/dL — ABNORMAL HIGH (ref 0.76–1.27)
GFR calc Af Amer: 42 mL/min/{1.73_m2} — ABNORMAL LOW (ref >59)
GFR calc non Af Amer: 37 mL/min/{1.73_m2} — ABNORMAL LOW (ref >59)
Glucose: 77 mg/dL (ref 65–99)
Potassium: 3.9 mmol/L (ref 3.5–5.2)
Sodium: 142 mmol/L (ref 134–144)

## 2019-03-24 NOTE — Telephone Encounter (Signed)
-----   Message from Charlott Rakes, MD sent at 03/24/2019  1:29 PM EDT ----- Labs reveal abnormal kidney function for which I had referred him to nephrology at his last office visit.  Please advise to abstain from NSAIDs

## 2019-03-24 NOTE — Telephone Encounter (Signed)
Patient name and DOB has been verified Patient was informed of lab results. Patient had no questions.  

## 2019-03-27 ENCOUNTER — Emergency Department (HOSPITAL_COMMUNITY)
Admission: EM | Admit: 2019-03-27 | Discharge: 2019-03-27 | Disposition: A | Discharge status: Home / Self Care | Payer: Medicare Other | Attending: Emergency Medicine | Admitting: Emergency Medicine

## 2019-03-27 DIAGNOSIS — K9400 Colostomy complication, unspecified: Secondary | ICD-10-CM

## 2019-03-27 DIAGNOSIS — Z433 Encounter for attention to colostomy: Secondary | ICD-10-CM | POA: Diagnosis not present

## 2019-03-27 DIAGNOSIS — Z5321 Procedure and treatment not carried out due to patient leaving prior to being seen by health care provider: Secondary | ICD-10-CM | POA: Insufficient documentation

## 2019-03-27 NOTE — ED Triage Notes (Signed)
Pt to ER requesting ostomy supplies. States the bags he has do not fit, contents leaking around stoma noted.

## 2019-03-27 NOTE — ED Notes (Signed)
Patient not in room when EDP went to assess.

## 2019-03-27 NOTE — ED Notes (Signed)
Patient still not in room - checked waiting room - no answer.

## 2019-03-28 ENCOUNTER — Emergency Department (HOSPITAL_COMMUNITY)
Admission: EM | Admit: 2019-03-28 | Discharge: 2019-03-28 | Disposition: A | Discharge status: Home / Self Care | Payer: Medicare Other | Attending: Emergency Medicine | Admitting: Emergency Medicine

## 2019-03-28 ENCOUNTER — Other Ambulatory Visit: Payer: Self-pay

## 2019-03-28 ENCOUNTER — Encounter (HOSPITAL_COMMUNITY): Payer: Self-pay | Admitting: Emergency Medicine

## 2019-03-28 DIAGNOSIS — Z89021 Acquired absence of right finger(s): Secondary | ICD-10-CM | POA: Insufficient documentation

## 2019-03-28 DIAGNOSIS — Z87891 Personal history of nicotine dependence: Secondary | ICD-10-CM | POA: Insufficient documentation

## 2019-03-28 DIAGNOSIS — Z432 Encounter for attention to ileostomy: Secondary | ICD-10-CM | POA: Insufficient documentation

## 2019-03-28 DIAGNOSIS — Z433 Encounter for attention to colostomy: Secondary | ICD-10-CM | POA: Diagnosis present

## 2019-03-28 NOTE — Discharge Instructions (Signed)
Thank you for allowing me to care for you today in the Emergency Department.   If you continue to have pain, follow-up with your ostomy team.  You may need to be evaluated by the ostomy nurse specialist.  Return to the emergency department if you have high fevers, chills, significantly increased ostomy output, or other new, concerning symptoms.

## 2019-03-28 NOTE — ED Triage Notes (Signed)
Patient requesting colostomy bag /supplies.

## 2019-03-28 NOTE — ED Notes (Signed)
Pt provided with ostomy supplies and [place] sandwich
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
## 2019-03-29 ENCOUNTER — Encounter (HOSPITAL_COMMUNITY): Payer: Self-pay | Admitting: Emergency Medicine

## 2019-03-29 ENCOUNTER — Other Ambulatory Visit: Payer: Self-pay

## 2019-03-29 ENCOUNTER — Emergency Department (HOSPITAL_COMMUNITY)
Admission: EM | Admit: 2019-03-29 | Discharge: 2019-03-30 | Disposition: A | Discharge status: Home / Self Care | Payer: Medicare Other | Attending: Emergency Medicine | Admitting: Emergency Medicine

## 2019-03-29 DIAGNOSIS — Z433 Encounter for attention to colostomy: Secondary | ICD-10-CM | POA: Insufficient documentation

## 2019-03-29 DIAGNOSIS — Z933 Colostomy status: Secondary | ICD-10-CM | POA: Diagnosis not present

## 2019-03-29 DIAGNOSIS — Z89021 Acquired absence of right finger(s): Secondary | ICD-10-CM | POA: Diagnosis not present

## 2019-03-29 DIAGNOSIS — Z87891 Personal history of nicotine dependence: Secondary | ICD-10-CM | POA: Diagnosis not present

## 2019-03-29 DIAGNOSIS — M79671 Pain in right foot: Secondary | ICD-10-CM | POA: Insufficient documentation

## 2019-03-29 DIAGNOSIS — G8929 Other chronic pain: Secondary | ICD-10-CM | POA: Diagnosis not present

## 2019-03-29 NOTE — Discharge Instructions (Addendum)
Please continue to follow-up with your doctor.  The emergency department is not the best place for colostomy care.  Please discuss alternatives with your doctor.    Pleas discuss your chronic foot/leg pain with your doctor and continue taking your blood thinner.

## 2019-03-29 NOTE — ED Triage Notes (Signed)
Patient requesting colostomy bag replacement . Patient added chronic right foot swelling , no injury.
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
## 2019-03-30 ENCOUNTER — Telehealth: Payer: Self-pay | Admitting: Family Medicine

## 2019-03-30 NOTE — Telephone Encounter (Signed)
I Don't see a referral for General Surgery in place

## 2019-03-30 NOTE — Telephone Encounter (Signed)
Patient called to inform [place] [facility] states they have not received their paperwork for the referral please follow up

## 2019-03-30 NOTE — ED Notes (Signed)
Replacement Colostomy bag provided.Marland Kitchen discharge paperwork given

## 2019-04-21 ENCOUNTER — Emergency Department (HOSPITAL_COMMUNITY)
Admission: EM | Admit: 2019-04-21 | Discharge: 2019-04-21 | Discharge status: Against Medical Advice | Payer: Medicare Other | Attending: Emergency Medicine | Admitting: Emergency Medicine

## 2019-04-21 ENCOUNTER — Encounter (HOSPITAL_COMMUNITY): Payer: Self-pay

## 2019-04-21 ENCOUNTER — Emergency Department (HOSPITAL_COMMUNITY)
Admission: EM | Admit: 2019-04-21 | Discharge: 2019-04-21 | Discharge status: Against Medical Advice | Payer: Medicare Other | Source: Home / Self Care

## 2019-04-21 ENCOUNTER — Other Ambulatory Visit: Payer: Self-pay

## 2019-04-21 ENCOUNTER — Encounter (HOSPITAL_COMMUNITY): Payer: Self-pay | Admitting: Emergency Medicine

## 2019-04-21 DIAGNOSIS — Z433 Encounter for attention to colostomy: Secondary | ICD-10-CM | POA: Diagnosis present

## 2019-04-21 DIAGNOSIS — Z5321 Procedure and treatment not carried out due to patient leaving prior to being seen by health care provider: Secondary | ICD-10-CM | POA: Diagnosis not present

## 2019-04-21 NOTE — ED Notes (Signed)
Pt during triage stating he is in a rush to catch the bus, that he needs something now. This nurse provided with a bag to change at home. Pt states he will go to the bathroom and change his bag, no longer rushing to leave.

## 2019-04-21 NOTE — ED Triage Notes (Signed)
Pt arrived with complaints of a leaking colostomy bag. States he has been told to use other resources but his order does not come in until tomorrow. Pt states due to being unable to change his bag his skin is irritated.

## 2019-04-21 NOTE — ED Triage Notes (Signed)
Patient ran out of his colostomy bag , requesting colostomy bag .

## 2019-04-26 ENCOUNTER — Ambulatory Visit: Payer: Medicaid Other | Admitting: Family Medicine

## 2019-05-07 ENCOUNTER — Encounter: Payer: Medicaid Other | Admitting: Vascular Surgery

## 2019-05-15 ENCOUNTER — Encounter (HOSPITAL_COMMUNITY): Payer: Self-pay | Admitting: Emergency Medicine

## 2019-05-15 ENCOUNTER — Other Ambulatory Visit: Payer: Self-pay

## 2019-05-15 ENCOUNTER — Emergency Department (HOSPITAL_COMMUNITY)
Admission: EM | Admit: 2019-05-15 | Discharge: 2019-05-16 | Disposition: A | Discharge status: Home / Self Care | Payer: Medicare Other | Attending: Emergency Medicine | Admitting: Emergency Medicine

## 2019-05-15 DIAGNOSIS — M79675 Pain in left toe(s): Secondary | ICD-10-CM | POA: Diagnosis not present

## 2019-05-15 DIAGNOSIS — I82409 Acute embolism and thrombosis of unspecified deep veins of unspecified lower extremity: Secondary | ICD-10-CM | POA: Insufficient documentation

## 2019-05-15 DIAGNOSIS — N200 Calculus of kidney: Secondary | ICD-10-CM | POA: Diagnosis not present

## 2019-05-15 DIAGNOSIS — Z87891 Personal history of nicotine dependence: Secondary | ICD-10-CM | POA: Diagnosis not present

## 2019-05-15 DIAGNOSIS — Z7901 Long term (current) use of anticoagulants: Secondary | ICD-10-CM | POA: Insufficient documentation

## 2019-05-15 DIAGNOSIS — R319 Hematuria, unspecified: Secondary | ICD-10-CM | POA: Diagnosis not present

## 2019-05-15 DIAGNOSIS — M19072 Primary osteoarthritis, left ankle and foot: Secondary | ICD-10-CM | POA: Diagnosis not present

## 2019-05-15 DIAGNOSIS — M7989 Other specified soft tissue disorders: Secondary | ICD-10-CM | POA: Diagnosis not present

## 2019-05-15 LAB — URINALYSIS, ROUTINE W REFLEX MICROSCOPIC
Bacteria, UA: NONE SEEN
Bilirubin Urine: NEGATIVE
Glucose, UA: NEGATIVE mg/dL
Ketones, ur: 5 mg/dL — AB
Nitrite: NEGATIVE
Protein, ur: 100 mg/dL — AB
RBC / HPF: >50 RBC/hpf — ABNORMAL HIGH (ref 0–5)
Specific Gravity, Urine: 1.025 (ref 1.005–1.030)
pH: 6 (ref 5.0–8.0)
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
## 2019-05-16 ENCOUNTER — Emergency Department (HOSPITAL_COMMUNITY): Payer: Medicare Other

## 2019-05-16 DIAGNOSIS — R319 Hematuria, unspecified: Secondary | ICD-10-CM | POA: Diagnosis not present

## 2019-05-16 DIAGNOSIS — N200 Calculus of kidney: Secondary | ICD-10-CM | POA: Diagnosis not present

## 2019-05-16 DIAGNOSIS — M7989 Other specified soft tissue disorders: Secondary | ICD-10-CM | POA: Diagnosis not present

## 2019-05-16 DIAGNOSIS — M19072 Primary osteoarthritis, left ankle and foot: Secondary | ICD-10-CM | POA: Diagnosis not present

## 2019-05-16 LAB — CBC
HCT: 46.9 % (ref 39.0–52.0)
Hemoglobin: 15.6 g/dL (ref 13.0–17.0)
MCH: 33.3 pg (ref 26.0–34.0)
MCHC: 33.3 g/dL (ref 30.0–36.0)
MCV: 100 fL (ref 80.0–100.0)
Platelets: 210 10*3/uL (ref 150–400)
RBC: 4.69 MIL/uL (ref 4.22–5.81)
RDW: 13.5 % (ref 11.5–15.5)
WBC: 11.7 10*3/uL — ABNORMAL HIGH (ref 4.0–10.5)
nRBC: 0 % (ref 0.0–0.2)

## 2019-05-16 LAB — COMPREHENSIVE METABOLIC PANEL
ALT: 29 U/L (ref 0–44)
AST: 38 U/L (ref 15–41)
Albumin: 4.4 g/dL (ref 3.5–5.0)
Alkaline Phosphatase: 88 U/L (ref 38–126)
Anion gap: 13 (ref 5–15)
BUN: 25 mg/dL — ABNORMAL HIGH (ref 6–20)
CO2: 19 mmol/L — ABNORMAL LOW (ref 22–32)
Calcium: 9.7 mg/dL (ref 8.9–10.3)
Chloride: 101 mmol/L (ref 98–111)
Creatinine, Ser: 2.76 mg/dL — ABNORMAL HIGH (ref 0.61–1.24)
GFR calc Af Amer: 32 mL/min — ABNORMAL LOW (ref >60)
GFR calc non Af Amer: 27 mL/min — ABNORMAL LOW (ref >60)
Glucose, Bld: 81 mg/dL (ref 70–99)
Potassium: 3.9 mmol/L (ref 3.5–5.1)
Sodium: 133 mmol/L — ABNORMAL LOW (ref 135–145)
Total Bilirubin: 1.2 mg/dL (ref 0.3–1.2)
Total Protein: 9 g/dL — ABNORMAL HIGH (ref 6.5–8.1)

## 2019-05-16 NOTE — Discharge Instructions (Addendum)
You were evaluated in the Emergency Department and after careful evaluation, we did not find any emergent condition requiring admission or further testing in the hospital.  Your exam/testing today is overall reassuring.  Please follow-up with urology for further testing of the blood in your urine.  You can also follow-up with the orthopedic specialists for your pain in your foot.  Please return to the Emergency Department if you experience any worsening of your condition.  We encourage you to follow up with a primary care provider.  Thank you for allowing [place] to be a part of your care.

## 2019-05-16 NOTE — ED Notes (Signed)
Pt requesting scissors to change wafer and ostomy drainage bag.
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
## 2019-05-16 NOTE — ED Notes (Signed)
Patient verbalizes understanding of discharge instructions. Opportunity for questioning and answers were provided. Armband removed by staff, pt discharged from ED to home via POV. Pt given meal and allowed to change ostomy bag at d/c per pt request.

## 2019-05-17 LAB — URINE CULTURE: Culture: <10000 — AB

## 2019-05-20 ENCOUNTER — Other Ambulatory Visit: Payer: Self-pay

## 2019-05-20 ENCOUNTER — Encounter (HOSPITAL_COMMUNITY): Payer: Self-pay | Admitting: Emergency Medicine

## 2019-05-20 DIAGNOSIS — Z79899 Other long term (current) drug therapy: Secondary | ICD-10-CM | POA: Insufficient documentation

## 2019-05-20 DIAGNOSIS — Z432 Encounter for attention to ileostomy: Secondary | ICD-10-CM | POA: Diagnosis not present

## 2019-05-20 DIAGNOSIS — Z7901 Long term (current) use of anticoagulants: Secondary | ICD-10-CM | POA: Diagnosis not present

## 2019-05-20 NOTE — ED Triage Notes (Signed)
Pt states needs colostomy supplies and his supplies will not arrival until 1500 tomorrow but is in need today

## 2019-05-21 ENCOUNTER — Emergency Department (HOSPITAL_COMMUNITY)
Admission: EM | Admit: 2019-05-21 | Discharge: 2019-05-21 | Disposition: A | Discharge status: Home / Self Care | Payer: Medicare Other | Attending: Emergency Medicine | Admitting: Emergency Medicine

## 2019-05-21 DIAGNOSIS — Z432 Encounter for attention to ileostomy: Secondary | ICD-10-CM

## 2019-05-21 NOTE — ED Notes (Signed)
Following pts care plan pt was encouraged to seek supplies for his colostomy bag outside of the ED. Pt states that he still has all the resources provided to him.
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
## 2019-05-25 DIAGNOSIS — Z932 Ileostomy status: Secondary | ICD-10-CM | POA: Diagnosis not present

## 2019-05-25 DIAGNOSIS — K439 Ventral hernia without obstruction or gangrene: Secondary | ICD-10-CM | POA: Diagnosis not present

## 2019-05-25 DIAGNOSIS — Z87891 Personal history of nicotine dependence: Secondary | ICD-10-CM | POA: Diagnosis not present

## 2019-06-09 ENCOUNTER — Other Ambulatory Visit: Payer: Self-pay | Admitting: Family Medicine

## 2019-06-09 DIAGNOSIS — I872 Venous insufficiency (chronic) (peripheral): Secondary | ICD-10-CM

## 2019-06-09 NOTE — Telephone Encounter (Signed)
Patient with planned appointment on 04/26/19 but did not show up. Recent hospital labs show further decline in renal function. Will defer to PCP.

## 2019-06-13 ENCOUNTER — Encounter (HOSPITAL_COMMUNITY): Payer: Self-pay

## 2019-06-13 ENCOUNTER — Emergency Department (HOSPITAL_COMMUNITY)
Admission: EM | Admit: 2019-06-13 | Discharge: 2019-06-13 | Disposition: A | Discharge status: Home / Self Care | Payer: Medicare Other | Attending: Emergency Medicine | Admitting: Emergency Medicine

## 2019-06-13 ENCOUNTER — Other Ambulatory Visit: Payer: Self-pay

## 2019-06-13 DIAGNOSIS — Z432 Encounter for attention to ileostomy: Secondary | ICD-10-CM | POA: Insufficient documentation

## 2019-06-13 DIAGNOSIS — Z7901 Long term (current) use of anticoagulants: Secondary | ICD-10-CM | POA: Insufficient documentation

## 2019-06-13 DIAGNOSIS — Z433 Encounter for attention to colostomy: Secondary | ICD-10-CM | POA: Diagnosis not present

## 2019-06-13 DIAGNOSIS — Z79899 Other long term (current) drug therapy: Secondary | ICD-10-CM | POA: Insufficient documentation

## 2019-06-13 DIAGNOSIS — Z87891 Personal history of nicotine dependence: Secondary | ICD-10-CM | POA: Diagnosis not present

## 2019-06-13 NOTE — ED Notes (Signed)
This RN assisted in securing new ostomy bag on pt and reviewed  DC instructions as well as need to consult PCP for long term management and supply

## 2019-06-13 NOTE — ED Notes (Signed)
Pt verbalizes understanding of DC instructions. Pt belongings returned and is ambulatory out of ED.  

## 2019-06-13 NOTE — ED Notes (Signed)
Pt self emptied colostomy bag 100 ml

## 2019-06-13 NOTE — Discharge Instructions (Signed)
Return to the emergency department for any new or worsening symptoms.  Watch for signs of infection including fever, chills, redness, rash or pus at ostomy site.  Please continue to take home medications as prescribed

## 2019-06-13 NOTE — ED Triage Notes (Signed)
Pt states that his colostomy bag broke yesterday and that he does not get new supplies until Wednesday.
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
## 2019-06-16 ENCOUNTER — Ambulatory Visit: Payer: Medicaid Other | Admitting: Family Medicine

## 2019-06-20 ENCOUNTER — Emergency Department (HOSPITAL_COMMUNITY)
Admission: EM | Admit: 2019-06-20 | Discharge: 2019-06-21 | Discharge status: Absent without leave | Payer: Medicare Other

## 2019-06-20 ENCOUNTER — Other Ambulatory Visit: Payer: Self-pay

## 2019-06-20 DIAGNOSIS — R52 Pain, unspecified: Secondary | ICD-10-CM | POA: Diagnosis not present

## 2019-06-20 DIAGNOSIS — W19XXXA Unspecified fall, initial encounter: Secondary | ICD-10-CM | POA: Diagnosis not present

## 2019-06-20 DIAGNOSIS — M25571 Pain in right ankle and joints of right foot: Secondary | ICD-10-CM | POA: Diagnosis not present

## 2019-06-24 ENCOUNTER — Encounter: Payer: Self-pay | Admitting: Family Medicine

## 2019-06-24 ENCOUNTER — Ambulatory Visit: Payer: Medicare Other | Attending: Family Medicine | Admitting: Family Medicine

## 2019-06-24 ENCOUNTER — Other Ambulatory Visit: Payer: Self-pay

## 2019-06-24 DIAGNOSIS — M7989 Other specified soft tissue disorders: Secondary | ICD-10-CM

## 2019-06-24 DIAGNOSIS — I82511 Chronic embolism and thrombosis of right femoral vein: Secondary | ICD-10-CM | POA: Diagnosis not present

## 2019-06-24 DIAGNOSIS — I872 Venous insufficiency (chronic) (peripheral): Secondary | ICD-10-CM | POA: Diagnosis not present

## 2019-06-24 NOTE — Progress Notes (Signed)
Patient verified DOB Patient has taken medication Patient has eaten today. Patient denies pain at this time. Patient states the the right leg swelled 3 days ago. Patient states he was out of medication for a week being at work and not picking up. Patient states swelling is from the foot up to the thigh. Patient picked up yesterday.
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
## 2019-06-28 ENCOUNTER — Ambulatory Visit: Payer: Medicaid Other | Admitting: Family Medicine

## 2019-06-29 ENCOUNTER — Ambulatory Visit: Payer: Medicaid Other | Admitting: Pharmacist

## 2019-07-13 ENCOUNTER — Other Ambulatory Visit: Payer: Self-pay

## 2019-07-13 ENCOUNTER — Encounter (HOSPITAL_COMMUNITY): Payer: Self-pay | Admitting: Emergency Medicine

## 2019-07-13 ENCOUNTER — Emergency Department (HOSPITAL_COMMUNITY)
Admission: EM | Admit: 2019-07-13 | Discharge: 2019-07-13 | Disposition: A | Discharge status: Home / Self Care | Payer: Medicare Other | Attending: Emergency Medicine | Admitting: Emergency Medicine

## 2019-07-13 DIAGNOSIS — L03115 Cellulitis of right lower limb: Secondary | ICD-10-CM | POA: Insufficient documentation

## 2019-07-13 DIAGNOSIS — I82599 Chronic embolism and thrombosis of other specified deep vein of unspecified lower extremity: Secondary | ICD-10-CM | POA: Insufficient documentation

## 2019-07-13 DIAGNOSIS — Z87891 Personal history of nicotine dependence: Secondary | ICD-10-CM | POA: Diagnosis not present

## 2019-07-13 DIAGNOSIS — M79671 Pain in right foot: Secondary | ICD-10-CM | POA: Diagnosis present

## 2019-07-13 DIAGNOSIS — Z7901 Long term (current) use of anticoagulants: Secondary | ICD-10-CM | POA: Diagnosis not present

## 2019-07-13 LAB — CBC WITH DIFFERENTIAL/PLATELET
Abs Immature Granulocytes: 0.05 10*3/uL (ref 0.00–0.07)
Basophils Absolute: 0.1 10*3/uL (ref 0.0–0.1)
Basophils Relative: 1 %
Eosinophils Absolute: 1.1 10*3/uL — ABNORMAL HIGH (ref 0.0–0.5)
Eosinophils Relative: 8 %
HCT: 37.7 % — ABNORMAL LOW (ref 39.0–52.0)
Hemoglobin: 12.3 g/dL — ABNORMAL LOW (ref 13.0–17.0)
Immature Granulocytes: 0 %
Lymphocytes Relative: 21 %
Lymphs Abs: 2.6 10*3/uL (ref 0.7–4.0)
MCH: 31.7 pg (ref 26.0–34.0)
MCHC: 32.6 g/dL (ref 30.0–36.0)
MCV: 97.2 fL (ref 80.0–100.0)
Monocytes Absolute: 1 10*3/uL (ref 0.1–1.0)
Monocytes Relative: 8 %
Neutro Abs: 7.9 10*3/uL — ABNORMAL HIGH (ref 1.7–7.7)
Neutrophils Relative %: 62 %
Platelets: 210 10*3/uL (ref 150–400)
RBC: 3.88 MIL/uL — ABNORMAL LOW (ref 4.22–5.81)
RDW: 13.7 % (ref 11.5–15.5)
WBC: 12.7 10*3/uL — ABNORMAL HIGH (ref 4.0–10.5)
nRBC: 0 % (ref 0.0–0.2)

## 2019-07-13 LAB — BASIC METABOLIC PANEL
Anion gap: 12 (ref 5–15)
BUN: 27 mg/dL — ABNORMAL HIGH (ref 6–20)
CO2: 17 mmol/L — ABNORMAL LOW (ref 22–32)
Calcium: 9.6 mg/dL (ref 8.9–10.3)
Chloride: 108 mmol/L (ref 98–111)
Creatinine, Ser: 1.93 mg/dL — ABNORMAL HIGH (ref 0.61–1.24)
GFR calc Af Amer: 49 mL/min — ABNORMAL LOW (ref >60)
GFR calc non Af Amer: 42 mL/min — ABNORMAL LOW (ref >60)
Glucose, Bld: 87 mg/dL (ref 70–99)
Potassium: 3.9 mmol/L (ref 3.5–5.1)
Sodium: 137 mmol/L (ref 135–145)

## 2019-07-13 MED ORDER — DOXYCYCLINE HYCLATE 100 MG PO TABS
100.0000 mg | ORAL_TABLET | Freq: Once | ORAL | Status: AC
  Administered 2019-07-13: 100 mg via ORAL
  Filled 2019-07-13: qty 1

## 2019-07-13 MED ORDER — DOXYCYCLINE HYCLATE 100 MG PO CAPS: 100.0000 mg | ORAL_CAPSULE | Freq: Two times a day (BID) | ORAL | 0 refills | Status: DC

## 2019-07-13 NOTE — ED Triage Notes (Signed)
Patient reports chronic right foot pain with swelling worse today , denies recent injury/ambulatory .
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
## 2019-07-13 NOTE — Discharge Instructions (Addendum)
Please take the antibiotic as prescribed for the infection in your right leg.   It is strongly recommended that you see your doctor in 2 days for recheck.

## 2019-07-13 NOTE — ED Provider Notes (Signed)
Medical screening examination/treatment/procedure(s) were conducted as a shared visit with non-physician practitioner(s) and myself.  I personally evaluated the patient during the encounter.  Patient multiple medical problems documented below and is well-known to emergency room.  Has history of chronic right leg issues but over the last 12 to 16 hours is at significant erythema, redness and and slightly worsening edema of his right lower extremity.  States is always a little bit swollen but is much worse and much worse pain than usual. On exam here his vital signs are within normal limits however he does have significant erythema all the way from his foot to a few inches proximal to his knee on the right leg.  This whole area is slightly indurated and significantly tender to palpation. Patient states compliance with his Xarelto and this appears to be more consistent with cellulitis anyway.  With death advised start antibiotics checking labs reassess in for disposition.  With all his comorbidities and the extent of the cellulitis along with how rapidly is progressed it would not be unreasonable to observe him in the hospital to make sure he had improvement however if stable in ED can likely be discharged with close outpatient PCP followup and strict return precautions.   Lilyanah Celestin, Corene Cornea, MD 07/13/19 [phone]

## 2019-07-20 ENCOUNTER — Encounter (HOSPITAL_COMMUNITY): Payer: Self-pay | Admitting: Emergency Medicine

## 2019-07-20 ENCOUNTER — Other Ambulatory Visit: Payer: Self-pay

## 2019-07-20 ENCOUNTER — Emergency Department (HOSPITAL_COMMUNITY)
Admission: EM | Admit: 2019-07-20 | Discharge: 2019-07-20 | Disposition: A | Discharge status: Home / Self Care | Payer: Medicare Other | Attending: Emergency Medicine | Admitting: Emergency Medicine

## 2019-07-20 DIAGNOSIS — Z87891 Personal history of nicotine dependence: Secondary | ICD-10-CM | POA: Insufficient documentation

## 2019-07-20 DIAGNOSIS — Z433 Encounter for attention to colostomy: Secondary | ICD-10-CM | POA: Diagnosis not present

## 2019-07-20 DIAGNOSIS — Z79899 Other long term (current) drug therapy: Secondary | ICD-10-CM | POA: Insufficient documentation
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
## 2019-07-20 NOTE — Discharge Instructions (Signed)
Return if any problems.

## 2019-07-20 NOTE — ED Notes (Signed)
Patient Alert and oriented to baseline. Stable and ambulatory to baseline. Patient verbalized understanding of the discharge instructions.  Patient belongings were taken by the patient.   
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
## 2019-07-21 ENCOUNTER — Emergency Department (HOSPITAL_COMMUNITY)
Admission: EM | Admit: 2019-07-21 | Discharge: 2019-07-21 | Disposition: A | Discharge status: Home / Self Care | Payer: Medicare Other | Attending: Emergency Medicine | Admitting: Emergency Medicine

## 2019-07-21 ENCOUNTER — Encounter: Payer: Self-pay | Admitting: Family Medicine

## 2019-07-21 ENCOUNTER — Ambulatory Visit (HOSPITAL_BASED_OUTPATIENT_CLINIC_OR_DEPARTMENT_OTHER): Payer: Medicare Other | Admitting: Family Medicine

## 2019-07-21 ENCOUNTER — Encounter (HOSPITAL_COMMUNITY): Payer: Self-pay | Admitting: Obstetrics and Gynecology

## 2019-07-21 ENCOUNTER — Other Ambulatory Visit: Payer: Self-pay

## 2019-07-21 DIAGNOSIS — M21371 Foot drop, right foot: Secondary | ICD-10-CM | POA: Diagnosis not present

## 2019-07-21 DIAGNOSIS — M79604 Pain in right leg: Secondary | ICD-10-CM | POA: Insufficient documentation

## 2019-07-21 DIAGNOSIS — I82511 Chronic embolism and thrombosis of right femoral vein: Secondary | ICD-10-CM | POA: Diagnosis not present

## 2019-07-21 DIAGNOSIS — Z433 Encounter for attention to colostomy: Secondary | ICD-10-CM | POA: Diagnosis present

## 2019-07-21 DIAGNOSIS — I872 Venous insufficiency (chronic) (peripheral): Secondary | ICD-10-CM | POA: Diagnosis not present

## 2019-07-21 DIAGNOSIS — M792 Neuralgia and neuritis, unspecified: Secondary | ICD-10-CM | POA: Diagnosis not present

## 2019-07-21 DIAGNOSIS — I82599 Chronic embolism and thrombosis of other specified deep vein of unspecified lower extremity: Secondary | ICD-10-CM | POA: Diagnosis not present

## 2019-07-21 DIAGNOSIS — Z87891 Personal history of nicotine dependence: Secondary | ICD-10-CM | POA: Insufficient documentation

## 2019-07-21 DIAGNOSIS — K9403 Colostomy malfunction: Secondary | ICD-10-CM | POA: Diagnosis not present

## 2019-07-21 DIAGNOSIS — Z7901 Long term (current) use of anticoagulants: Secondary | ICD-10-CM | POA: Diagnosis not present

## 2019-07-21 DIAGNOSIS — K9409 Other complications of colostomy: Secondary | ICD-10-CM | POA: Diagnosis not present

## 2019-07-21 MED ORDER — FUROSEMIDE 20 MG PO TABS: 20.0000 mg | ORAL_TABLET | Freq: Every day | ORAL | 0 refills | Status: DC

## 2019-07-21 MED ORDER — METHOCARBAMOL 500 MG PO TABS: 500.0000 mg | ORAL_TABLET | Freq: Three times a day (TID) | ORAL | 3 refills | Status: DC | PRN

## 2019-07-21 MED ORDER — PREGABALIN 100 MG PO CAPS: 100.0000 mg | ORAL_CAPSULE | Freq: Two times a day (BID) | ORAL | 3 refills | Status: DC

## 2019-07-21 MED ORDER — DULOXETINE HCL 60 MG PO CPEP: 60.0000 mg | ORAL_CAPSULE | Freq: Every day | ORAL | 3 refills | Status: DC

## 2019-07-21 MED ORDER — RIVAROXABAN 15 MG PO TABS: 15.0000 mg | ORAL_TABLET | Freq: Every day | ORAL | 3 refills | Status: DC

## 2019-07-21 NOTE — Discharge Instructions (Addendum)
1.  Go to your appointment today with Dr.Enobong. 2.  You have had a chronic blood clot in your right leg.  This causes swelling due to poor blood return to the veins.  Is been a very chronic problem.  Read the instructions regarding education on chronic venous insufficiency.  This is best managed with compression socks and elevation.  You must always take your scribed Xarelto to prevent newer clot from developing.  You said you were not familiar with this diagnosis, discuss this with your doctor during your appointment today.  The management of this condition is elevation and compression.  It will never go away completely.  Talk to your doctor about possible referral to a vein specialist if you have not already been seen by a vein specialist (aka Vascular surgeon).  Chronic venous insufficiency (also known as chronic venous stasis) must be managed otherwise you will develop wounds/ulcers of the leg that are very hard to heal and deteriorating condition of the skin with risk of repeat infections called cellulitis (you were prescribed Doxycycline (an antibiotic) on your 07/13/2019 visit to the ED. This prescription is still available to be filled (I have reprinted those discharge instructions for you). 3. Your colostomy bag is functioning at this time. Managing your colostomy bag is not a problem that is appropriate for the emergency department on a routine basis. Because you have come to the emergency department so many times for colostomy supplies, you have a care plan which indicates many arrangements have been made for you to get the care you need. It is your responsibility to to get your supplies and care for your colostomy.
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
## 2019-07-21 NOTE — Patient Instructions (Signed)
Chronic Venous Insufficiency Chronic venous insufficiency is a condition where the leg veins cannot effectively pump blood from the legs to the heart. This happens when the vein walls are either stretched, weakened, or damaged, or when the valves inside the vein are damaged. With the right treatment, you should be able to continue with an active life. This condition is also called venous stasis. What are the causes? Common causes of this condition include:  High blood pressure inside the veins (venous hypertension).  Sitting or standing too long, causing increased blood pressure in the leg veins.  A blood clot that blocks blood flow in a vein (deep vein thrombosis, DVT).  Inflammation of a vein (phlebitis) that causes a blood clot to form.  Tumors in the pelvis that cause blood to back up. What increases the risk? The following factors may make you more likely to develop this condition:  Having a family history of this condition.  Obesity.  Pregnancy.  Living without enough regular physical activity or exercise (sedentary lifestyle).  Smoking.  Having a job that requires long periods of standing or sitting in one place.  Being a certain age. Women in their 40s and 50s and men in their 70s are more likely to develop this condition. What are the signs or symptoms? Symptoms of this condition include:  Veins that are enlarged, bulging, or twisted (varicose veins).  Skin breakdown or ulcers.  Reddened skin or dark discoloration of skin on the leg between the knee and ankle.  Brown, smooth, tight, and painful skin just above the ankle, usually on the inside of the leg (lipodermatosclerosis).  Swelling of the legs. How is this diagnosed? This condition may be diagnosed based on:  Your medical history.  A physical exam.  Tests, such as: ? A procedure that creates an image of a blood vessel and nearby organs and provides information about blood flow through the blood vessel  (duplex ultrasound). ? A procedure that tests blood flow (plethysmography). ? A procedure that looks at the veins using X-ray and dye (venogram). How is this treated? The goals of treatment are to help you return to an active life and to minimize pain or disability. Treatment depends on the severity of your condition, and it may include:  Wearing compression stockings. These can help relieve symptoms and help prevent your condition from getting worse. However, they do not cure the condition.  Sclerotherapy. This procedure involves an injection of a solution that shrinks damaged veins.  Surgery. This may involve: ? Removing a diseased vein (vein stripping). ? Cutting off blood flow through the vein (laser ablation surgery). ? Repairing or reconstructing a valve within the affected vein. Follow these instructions at home:      Wear compression stockings as told by your health care provider. These stockings help to prevent blood clots and reduce swelling in your legs.  Take over-the-counter and prescription medicines only as told by your health care provider.  Stay active by exercising, walking, or doing different activities. Ask your health care provider what activities are safe for you and how much exercise you need.  Drink enough fluid to keep your urine pale yellow.  Do not use any products that contain nicotine or tobacco, such as cigarettes, e-cigarettes, and chewing tobacco. If you need help quitting, ask your health care provider.  Keep all follow-up visits as told by your health care provider. This is important. Contact a health care provider if you:  Have redness, swelling, or more pain   in the affected area.  See a red streak or line that goes up or down from the affected area.  Have skin breakdown or skin loss in the affected area, even if the breakdown is small.  Get an injury in the affected area. Get help right away if:  You get an injury and an open wound in the  affected area.  You have: ? Severe pain that does not get better with medicine. ? Sudden numbness or weakness in the foot or ankle below the affected area. ? Trouble moving your foot or ankle. ? A fever. ? Worse or persistent symptoms. ? Chest pain. ? Shortness of breath. Summary  Chronic venous insufficiency is a condition where the leg veins cannot effectively pump blood from the legs to the heart.  Chronic venous insufficiency occurs when the vein walls become stretched, weakened, or damaged, or when valves within the vein are damaged.  Treatment depends on how severe your condition is. It often involves wearing compression stockings and may involve having a procedure.  Make sure you stay active by exercising, walking, or doing different activities. Ask your health care provider what activities are safe for you and how much exercise you need. This information is not intended to replace advice given to you by your health care provider. Make sure you discuss any questions you have with your health care provider. Document Released: 11/11/2006 Document Revised: 03/31/2018 Document Reviewed: 03/31/2018 Elsevier Patient Education  2020 [employer]  
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
## 2019-07-21 NOTE — Progress Notes (Signed)
Pain in right leg down to foot.

## 2019-07-29 ENCOUNTER — Other Ambulatory Visit: Payer: Self-pay

## 2019-07-29 ENCOUNTER — Encounter (HOSPITAL_COMMUNITY): Payer: Self-pay

## 2019-07-29 ENCOUNTER — Emergency Department (HOSPITAL_COMMUNITY)
Admission: EM | Admit: 2019-07-29 | Discharge: 2019-07-29 | Disposition: A | Discharge status: Home / Self Care | Payer: Medicare Other | Attending: Emergency Medicine | Admitting: Emergency Medicine

## 2019-07-29 DIAGNOSIS — R1031 Right lower quadrant pain: Secondary | ICD-10-CM | POA: Diagnosis present

## 2019-07-29 DIAGNOSIS — Z87891 Personal history of nicotine dependence: Secondary | ICD-10-CM | POA: Diagnosis not present

## 2019-07-29 DIAGNOSIS — Z79899 Other long term (current) drug therapy: Secondary | ICD-10-CM | POA: Diagnosis not present

## 2019-07-29 DIAGNOSIS — F1010 Alcohol abuse, uncomplicated: Secondary | ICD-10-CM | POA: Diagnosis not present

## 2019-07-29 DIAGNOSIS — Z433 Encounter for attention to colostomy: Secondary | ICD-10-CM

## 2019-07-29 NOTE — ED Triage Notes (Addendum)
Pt states R side pain, pt states he needs a doctor to look at his ileostomy. Pt requesting detox resources. Pt denies fever, SOB, cough.

## 2019-07-29 NOTE — ED Notes (Signed)
An After Visit Summary was printed and given to the patient. Discharge instructions and detox resources given to patient as requested, no further questions at this time.

## 2019-07-29 NOTE — ED Notes (Signed)
Patient has new ileostomy bag per request. Pt given sandwich and PO fluids as requested.
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
## 2019-07-29 NOTE — ED Notes (Signed)
Pt given bus pass as requested 

## 2019-07-30 DIAGNOSIS — F1410 Cocaine abuse, uncomplicated: Secondary | ICD-10-CM | POA: Insufficient documentation

## 2019-07-30 DIAGNOSIS — I82511 Chronic embolism and thrombosis of right femoral vein: Secondary | ICD-10-CM | POA: Diagnosis not present

## 2019-08-09 ENCOUNTER — Encounter: Payer: Self-pay | Admitting: Family Medicine

## 2019-08-20 ENCOUNTER — Other Ambulatory Visit: Payer: Self-pay | Admitting: Family Medicine

## 2019-08-20 DIAGNOSIS — I872 Venous insufficiency (chronic) (peripheral): Secondary | ICD-10-CM

## 2019-08-20 NOTE — Telephone Encounter (Signed)
1) Medication(s) Requested (by name): furosemide (LASIX) 20 MG tablet    2) Pharmacy of Choice:[facility] - [place], [place] - [address]   3) Special Requests:   Approved medications will be sent to the pharmacy, we will reach out if there is an issue.  Requests made after 3pm may not be addressed until the following business day!  If a patient is unsure of the name of the medication(s) please note and ask patient to call back when they are able to provide all info, do not send to responsible party until all information is available!

## 2019-08-27 ENCOUNTER — Other Ambulatory Visit: Payer: Self-pay

## 2019-08-27 DIAGNOSIS — Z20822 Contact with and (suspected) exposure to covid-19: Secondary | ICD-10-CM

## 2019-08-29 LAB — NOVEL CORONAVIRUS, NAA: SARS-CoV-2, NAA: NOT DETECTED

## 2019-08-30 ENCOUNTER — Other Ambulatory Visit: Payer: Self-pay

## 2019-08-30 ENCOUNTER — Emergency Department (HOSPITAL_COMMUNITY)
Admission: EM | Admit: 2019-08-30 | Discharge: 2019-08-30 | Discharge status: Absent without leave | Payer: Medicare Other

## 2019-09-22 ENCOUNTER — Emergency Department (HOSPITAL_COMMUNITY)
Admission: EM | Admit: 2019-09-22 | Discharge: 2019-09-22 | Disposition: A | Discharge status: Home / Self Care | Payer: Medicare Other | Attending: Emergency Medicine | Admitting: Emergency Medicine

## 2019-09-22 ENCOUNTER — Other Ambulatory Visit: Payer: Self-pay

## 2019-09-22 ENCOUNTER — Encounter (HOSPITAL_COMMUNITY): Payer: Self-pay

## 2019-09-22 DIAGNOSIS — Z87891 Personal history of nicotine dependence: Secondary | ICD-10-CM | POA: Diagnosis not present

## 2019-09-22 DIAGNOSIS — Z433 Encounter for attention to colostomy: Secondary | ICD-10-CM | POA: Diagnosis not present

## 2019-09-22 DIAGNOSIS — Z5900 Homelessness unspecified: Secondary | ICD-10-CM

## 2019-09-22 NOTE — Discharge Instructions (Signed)
Please follow with your primary care doctor for additional ostomy supplies.

## 2019-09-22 NOTE — ED Notes (Signed)
Pt provided with sandwich, colostomy supplies and soda on discharge.
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
## 2019-09-22 NOTE — ED Triage Notes (Signed)
Pt presents requesting a new colostomy bag and a sandwich. Pt denies pain is A&Ox4 and ambulatory.

## 2019-09-24 ENCOUNTER — Encounter: Payer: Medicare Other | Admitting: Vascular Surgery

## 2019-09-28 ENCOUNTER — Telehealth: Payer: Self-pay | Admitting: Family Medicine

## 2019-09-28 NOTE — Telephone Encounter (Signed)
FYI

## 2019-09-28 NOTE — Telephone Encounter (Signed)
Patient No showed twice with them. Can't be rescheduled due to wrong number. [facility] is closing the referral

## 2019-11-15 ENCOUNTER — Telehealth: Payer: Self-pay | Admitting: Family Medicine

## 2019-11-15 DIAGNOSIS — M792 Neuralgia and neuritis, unspecified: Secondary | ICD-10-CM

## 2019-11-15 MED ORDER — DULOXETINE HCL 60 MG PO CPEP: 60.0000 mg | ORAL_CAPSULE | Freq: Every day | ORAL | 3 refills | Status: DC

## 2019-11-15 NOTE — Telephone Encounter (Signed)
Will route to PCP for review. 

## 2019-11-15 NOTE — Telephone Encounter (Signed)
Patient called and requested for pcp to refill DULoxetine (CYMBALTA) 60 MG capsule [[id number]] ENDED . Patient was informed script ended. Patient stated if pcp will not refill until his appointment Thursday, what could he take for pain medications. Please follow up at your earliest convenience.

## 2019-11-15 NOTE — Telephone Encounter (Signed)
Refilled

## 2019-11-16 ENCOUNTER — Other Ambulatory Visit: Payer: Self-pay

## 2019-11-16 ENCOUNTER — Emergency Department (HOSPITAL_COMMUNITY)
Admission: EM | Admit: 2019-11-16 | Discharge: 2019-11-16 | Disposition: A | Discharge status: Home / Self Care | Payer: Medicare Other | Attending: Emergency Medicine | Admitting: Emergency Medicine

## 2019-11-16 DIAGNOSIS — Z5321 Procedure and treatment not carried out due to patient leaving prior to being seen by health care provider: Secondary | ICD-10-CM | POA: Insufficient documentation

## 2019-11-16 DIAGNOSIS — Z433 Encounter for attention to colostomy: Secondary | ICD-10-CM | POA: Diagnosis not present
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
## 2019-11-17 ENCOUNTER — Telehealth: Payer: Self-pay

## 2019-11-17 NOTE — Telephone Encounter (Signed)
PRIOR AUTHORIZATION FOR PREGABALIN/LYRICA APPROVED THRU 11/16/20

## 2019-11-18 ENCOUNTER — Encounter (HOSPITAL_COMMUNITY): Payer: Self-pay | Admitting: Emergency Medicine

## 2019-11-18 ENCOUNTER — Other Ambulatory Visit: Payer: Self-pay

## 2019-11-18 ENCOUNTER — Emergency Department (HOSPITAL_COMMUNITY): Payer: Medicare Other

## 2019-11-18 ENCOUNTER — Observation Stay (HOSPITAL_COMMUNITY)
Admission: EM | Admit: 2019-11-18 | Discharge: 2019-11-19 | Disposition: A | Discharge status: Home / Self Care | Payer: Medicare Other | Attending: Urology | Admitting: Urology

## 2019-11-18 ENCOUNTER — Ambulatory Visit (HOSPITAL_BASED_OUTPATIENT_CLINIC_OR_DEPARTMENT_OTHER): Payer: Medicare Other | Admitting: Physician Assistant

## 2019-11-18 DIAGNOSIS — M792 Neuralgia and neuritis, unspecified: Secondary | ICD-10-CM

## 2019-11-18 DIAGNOSIS — G8921 Chronic pain due to trauma: Secondary | ICD-10-CM | POA: Insufficient documentation

## 2019-11-18 DIAGNOSIS — R109 Unspecified abdominal pain: Secondary | ICD-10-CM | POA: Diagnosis not present

## 2019-11-18 DIAGNOSIS — Z932 Ileostomy status: Secondary | ICD-10-CM

## 2019-11-18 DIAGNOSIS — M21371 Foot drop, right foot: Secondary | ICD-10-CM

## 2019-11-18 DIAGNOSIS — Z03818 Encounter for observation for suspected exposure to other biological agents ruled out: Secondary | ICD-10-CM | POA: Diagnosis not present

## 2019-11-18 DIAGNOSIS — F4321 Adjustment disorder with depressed mood: Secondary | ICD-10-CM | POA: Insufficient documentation

## 2019-11-18 DIAGNOSIS — Z79899 Other long term (current) drug therapy: Secondary | ICD-10-CM | POA: Insufficient documentation

## 2019-11-18 DIAGNOSIS — I82511 Chronic embolism and thrombosis of right femoral vein: Secondary | ICD-10-CM | POA: Diagnosis not present

## 2019-11-18 DIAGNOSIS — Z7901 Long term (current) use of anticoagulants: Secondary | ICD-10-CM | POA: Insufficient documentation

## 2019-11-18 DIAGNOSIS — I872 Venous insufficiency (chronic) (peripheral): Secondary | ICD-10-CM

## 2019-11-18 DIAGNOSIS — N183 Chronic kidney disease, stage 3 unspecified: Secondary | ICD-10-CM | POA: Diagnosis not present

## 2019-11-18 DIAGNOSIS — M7989 Other specified soft tissue disorders: Secondary | ICD-10-CM

## 2019-11-18 DIAGNOSIS — N132 Hydronephrosis with renal and ureteral calculous obstruction: Secondary | ICD-10-CM | POA: Diagnosis not present

## 2019-11-18 DIAGNOSIS — Z09 Encounter for follow-up examination after completed treatment for conditions other than malignant neoplasm: Secondary | ICD-10-CM | POA: Diagnosis not present

## 2019-11-18 DIAGNOSIS — Z20822 Contact with and (suspected) exposure to covid-19: Secondary | ICD-10-CM | POA: Insufficient documentation

## 2019-11-18 DIAGNOSIS — N1330 Unspecified hydronephrosis: Secondary | ICD-10-CM | POA: Diagnosis not present

## 2019-11-18 DIAGNOSIS — N201 Calculus of ureter: Secondary | ICD-10-CM | POA: Diagnosis present

## 2019-11-18 DIAGNOSIS — R1110 Vomiting, unspecified: Secondary | ICD-10-CM | POA: Diagnosis not present

## 2019-11-18 LAB — COMPREHENSIVE METABOLIC PANEL
ALT: 34 U/L (ref 0–44)
AST: 39 U/L (ref 15–41)
Albumin: 4.4 g/dL (ref 3.5–5.0)
Alkaline Phosphatase: 80 U/L (ref 38–126)
Anion gap: 10 (ref 5–15)
BUN: 25 mg/dL — ABNORMAL HIGH (ref 6–20)
CO2: 23 mmol/L (ref 22–32)
Calcium: 9.3 mg/dL (ref 8.9–10.3)
Chloride: 103 mmol/L (ref 98–111)
Creatinine, Ser: 2.11 mg/dL — ABNORMAL HIGH (ref 0.61–1.24)
GFR calc Af Amer: 44 mL/min — ABNORMAL LOW (ref >60)
GFR calc non Af Amer: 38 mL/min — ABNORMAL LOW (ref >60)
Glucose, Bld: 124 mg/dL — ABNORMAL HIGH (ref 70–99)
Potassium: 4 mmol/L (ref 3.5–5.1)
Sodium: 136 mmol/L (ref 135–145)
Total Bilirubin: 0.6 mg/dL (ref 0.3–1.2)
Total Protein: 9.1 g/dL — ABNORMAL HIGH (ref 6.5–8.1)

## 2019-11-18 LAB — CBC
HCT: 42.3 % (ref 39.0–52.0)
Hemoglobin: 14.3 g/dL (ref 13.0–17.0)
MCH: 32.9 pg (ref 26.0–34.0)
MCHC: 33.8 g/dL (ref 30.0–36.0)
MCV: 97.5 fL (ref 80.0–100.0)
Platelets: 217 10*3/uL (ref 150–400)
RBC: 4.34 MIL/uL (ref 4.22–5.81)
RDW: 13.7 % (ref 11.5–15.5)
WBC: 17.1 10*3/uL — ABNORMAL HIGH (ref 4.0–10.5)
nRBC: 0 % (ref 0.0–0.2)

## 2019-11-18 LAB — LIPASE, BLOOD: Lipase: 22 U/L (ref 11–51)

## 2019-11-18 LAB — URINALYSIS, ROUTINE W REFLEX MICROSCOPIC
Bacteria, UA: NONE SEEN
Bilirubin Urine: NEGATIVE
Glucose, UA: NEGATIVE mg/dL
Ketones, ur: NEGATIVE mg/dL
Nitrite: NEGATIVE
Protein, ur: NEGATIVE mg/dL
RBC / HPF: >50 RBC/hpf — ABNORMAL HIGH (ref 0–5)
Specific Gravity, Urine: 1.012 (ref 1.005–1.030)
pH: 5 (ref 5.0–8.0)

## 2019-11-18 MED ORDER — ONDANSETRON HCL 4 MG/2ML IJ SOLN
INTRAMUSCULAR | Status: AC
  Administered 2019-11-18: 4 mg
  Filled 2019-11-18: qty 2

## 2019-11-18 MED ORDER — HYDROMORPHONE HCL 1 MG/ML IJ SOLN
1.0000 mg | Freq: Once | INTRAMUSCULAR | Status: AC
  Administered 2019-11-18: 1 mg via INTRAVENOUS
  Filled 2019-11-18: qty 1

## 2019-11-18 MED ORDER — PREGABALIN 100 MG PO CAPS: 100.0000 mg | ORAL_CAPSULE | Freq: Two times a day (BID) | ORAL | 3 refills | Status: DC

## 2019-11-18 MED ORDER — ONDANSETRON 4 MG PO TBDP
4.0000 mg | ORAL_TABLET | Freq: Once | ORAL | Status: AC | PRN
  Administered 2019-11-18: 18:00:00 4 mg via ORAL
  Filled 2019-11-18: qty 1

## 2019-11-18 MED ORDER — DULOXETINE HCL 60 MG PO CPEP: 60.0000 mg | ORAL_CAPSULE | Freq: Every day | ORAL | 3 refills | Status: DC

## 2019-11-18 MED ORDER — FUROSEMIDE 20 MG PO TABS: 20.0000 mg | ORAL_TABLET | Freq: Every day | ORAL | 2 refills | Status: DC

## 2019-11-18 MED ORDER — METHOCARBAMOL 500 MG PO TABS: 500.0000 mg | ORAL_TABLET | Freq: Three times a day (TID) | ORAL | 3 refills | Status: DC | PRN

## 2019-11-18 MED ORDER — RIVAROXABAN 15 MG PO TABS: 15.0000 mg | ORAL_TABLET | Freq: Every day | ORAL | 3 refills | Status: DC

## 2019-11-18 MED ORDER — FENTANYL CITRATE (PF) 100 MCG/2ML IJ SOLN
100.0000 ug | Freq: Once | INTRAMUSCULAR | Status: AC
  Administered 2019-11-18: 100 ug via INTRAVENOUS
  Filled 2019-11-18: qty 2
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
## 2019-11-18 NOTE — ED Triage Notes (Signed)
Patient reports emesis and right flank pain x1 hour.

## 2019-11-19 ENCOUNTER — Observation Stay (HOSPITAL_COMMUNITY): Payer: Medicare Other | Admitting: Registered Nurse

## 2019-11-19 ENCOUNTER — Observation Stay (HOSPITAL_COMMUNITY): Payer: Medicare Other

## 2019-11-19 ENCOUNTER — Encounter (HOSPITAL_COMMUNITY)
Admission: EM | Disposition: A | Discharge status: Home / Self Care | Payer: Self-pay | Source: Home / Self Care | Attending: Emergency Medicine

## 2019-11-19 ENCOUNTER — Encounter (HOSPITAL_COMMUNITY): Payer: Self-pay | Admitting: Urology

## 2019-11-19 DIAGNOSIS — N132 Hydronephrosis with renal and ureteral calculous obstruction: Secondary | ICD-10-CM | POA: Diagnosis not present

## 2019-11-19 DIAGNOSIS — N179 Acute kidney failure, unspecified: Secondary | ICD-10-CM | POA: Diagnosis not present

## 2019-11-19 DIAGNOSIS — Z20822 Contact with and (suspected) exposure to covid-19: Secondary | ICD-10-CM | POA: Diagnosis not present

## 2019-11-19 DIAGNOSIS — N201 Calculus of ureter: Secondary | ICD-10-CM | POA: Diagnosis present

## 2019-11-19 DIAGNOSIS — G8921 Chronic pain due to trauma: Secondary | ICD-10-CM | POA: Diagnosis not present

## 2019-11-19 DIAGNOSIS — I82511 Chronic embolism and thrombosis of right femoral vein: Secondary | ICD-10-CM | POA: Diagnosis not present

## 2019-11-19 DIAGNOSIS — Z7901 Long term (current) use of anticoagulants: Secondary | ICD-10-CM | POA: Diagnosis not present

## 2019-11-19 DIAGNOSIS — R112 Nausea with vomiting, unspecified: Secondary | ICD-10-CM | POA: Diagnosis not present

## 2019-11-19 DIAGNOSIS — F4321 Adjustment disorder with depressed mood: Secondary | ICD-10-CM | POA: Diagnosis not present

## 2019-11-19 DIAGNOSIS — R1110 Vomiting, unspecified: Secondary | ICD-10-CM | POA: Diagnosis not present

## 2019-11-19 DIAGNOSIS — R300 Dysuria: Secondary | ICD-10-CM | POA: Diagnosis not present

## 2019-11-19 DIAGNOSIS — Z79899 Other long term (current) drug therapy: Secondary | ICD-10-CM | POA: Diagnosis not present

## 2019-11-19 DIAGNOSIS — I82509 Chronic embolism and thrombosis of unspecified deep veins of unspecified lower extremity: Secondary | ICD-10-CM | POA: Diagnosis not present

## 2019-11-19 DIAGNOSIS — R109 Unspecified abdominal pain: Secondary | ICD-10-CM | POA: Diagnosis not present

## 2019-11-19 DIAGNOSIS — Z932 Ileostomy status: Secondary | ICD-10-CM | POA: Diagnosis not present

## 2019-11-19 DIAGNOSIS — E871 Hypo-osmolality and hyponatremia: Secondary | ICD-10-CM | POA: Diagnosis not present

## 2019-11-19 HISTORY — PX: CYSTOSCOPY WITH STENT PLACEMENT: SHX5790

## 2019-11-19 LAB — CBC
HCT: 41.9 % (ref 39.0–52.0)
Hemoglobin: 13.6 g/dL (ref 13.0–17.0)
MCH: 31.9 pg (ref 26.0–34.0)
MCHC: 32.5 g/dL (ref 30.0–36.0)
MCV: 98.1 fL (ref 80.0–100.0)
Platelets: 207 10*3/uL (ref 150–400)
RBC: 4.27 MIL/uL (ref 4.22–5.81)
RDW: 13.9 % (ref 11.5–15.5)
WBC: 13.3 10*3/uL — ABNORMAL HIGH (ref 4.0–10.5)
nRBC: 0 % (ref 0.0–0.2)

## 2019-11-19 LAB — RESPIRATORY PANEL BY RT PCR (FLU A&B, COVID)
Influenza A by PCR: NEGATIVE
Influenza B by PCR: NEGATIVE
SARS Coronavirus 2 by RT PCR: NEGATIVE

## 2019-11-19 LAB — BASIC METABOLIC PANEL
Anion gap: 10 (ref 5–15)
BUN: 26 mg/dL — ABNORMAL HIGH (ref 6–20)
CO2: 24 mmol/L (ref 22–32)
Calcium: 9.2 mg/dL (ref 8.9–10.3)
Chloride: 105 mmol/L (ref 98–111)
Creatinine, Ser: 2.1 mg/dL — ABNORMAL HIGH (ref 0.61–1.24)
GFR calc Af Amer: 44 mL/min — ABNORMAL LOW (ref >60)
GFR calc non Af Amer: 38 mL/min — ABNORMAL LOW (ref >60)
Glucose, Bld: 111 mg/dL — ABNORMAL HIGH (ref 70–99)
Potassium: 4.1 mmol/L (ref 3.5–5.1)
Sodium: 139 mmol/L (ref 135–145)

## 2019-11-19 LAB — HIV ANTIBODY (ROUTINE TESTING W REFLEX): HIV Screen 4th Generation wRfx: NONREACTIVE

## 2019-11-19 SURGERY — CYSTOSCOPY, WITH STENT INSERTION
Anesthesia: General | Laterality: Right

## 2019-11-19 MED ORDER — SUCCINYLCHOLINE CHLORIDE 20 MG/ML IJ SOLN
INTRAMUSCULAR | Status: DC | PRN
Start: 2019-11-19 — End: 2019-11-19
  Administered 2019-11-19: 140 mg via INTRAVENOUS

## 2019-11-19 MED ORDER — HYDROMORPHONE HCL 1 MG/ML IJ SOLN
0.5000 mg | Freq: Once | INTRAMUSCULAR | Status: AC
  Administered 2019-11-19: 0.5 mg via INTRAVENOUS
  Filled 2019-11-19: qty 1

## 2019-11-19 MED ORDER — PROPOFOL 10 MG/ML IV BOLUS
INTRAVENOUS | Status: DC | PRN
  Administered 2019-11-19: 110 mg via INTRAVENOUS

## 2019-11-19 MED ORDER — PROMETHAZINE HCL 25 MG/ML IJ SOLN: 6.2500 mg | INTRAMUSCULAR | Status: DC | PRN

## 2019-11-19 MED ORDER — PROPOFOL 10 MG/ML IV BOLUS
INTRAVENOUS | Status: AC
  Filled 2019-11-19: qty 20

## 2019-11-19 MED ORDER — HYDROCODONE-ACETAMINOPHEN 5-325 MG PO TABS: 1.0000 | ORAL_TABLET | ORAL | 0 refills | Status: DC | PRN

## 2019-11-19 MED ORDER — HYDROMORPHONE HCL 1 MG/ML IJ SOLN
0.5000 mg | INTRAMUSCULAR | Status: DC | PRN
  Administered 2019-11-19 (×3): 1 mg via INTRAVENOUS
  Filled 2019-11-19 (×3): qty 1

## 2019-11-19 MED ORDER — CEFAZOLIN SODIUM-DEXTROSE 2-4 GM/100ML-% IV SOLN
2.0000 g | Freq: Once | INTRAVENOUS | Status: AC
  Administered 2019-11-19: 2 g via INTRAVENOUS

## 2019-11-19 MED ORDER — MIDAZOLAM HCL 5 MG/5ML IJ SOLN
INTRAMUSCULAR | Status: DC | PRN
  Administered 2019-11-19: 2 mg via INTRAVENOUS

## 2019-11-19 MED ORDER — LACTATED RINGERS IV SOLN: INTRAVENOUS | Status: DC

## 2019-11-19 MED ORDER — DEXAMETHASONE SODIUM PHOSPHATE 10 MG/ML IJ SOLN
INTRAMUSCULAR | Status: AC
  Filled 2019-11-19: qty 1

## 2019-11-19 MED ORDER — SODIUM CHLORIDE 0.9 % IR SOLN
Status: DC | PRN
  Administered 2019-11-19: 3000 mL

## 2019-11-19 MED ORDER — DIPHENHYDRAMINE HCL 50 MG/ML IJ SOLN
INTRAMUSCULAR | Status: DC | PRN
Start: 2019-11-19 — End: 2019-11-19
  Administered 2019-11-19 (×2): 25 mg via INTRAVENOUS

## 2019-11-19 MED ORDER — FENTANYL CITRATE (PF) 250 MCG/5ML IJ SOLN
INTRAMUSCULAR | Status: AC
  Filled 2019-11-19: qty 5

## 2019-11-19 MED ORDER — MIDAZOLAM HCL 2 MG/2ML IJ SOLN
INTRAMUSCULAR | Status: AC
  Filled 2019-11-19: qty 2

## 2019-11-19 MED ORDER — CEFAZOLIN SODIUM-DEXTROSE 2-4 GM/100ML-% IV SOLN
INTRAVENOUS | Status: AC
  Filled 2019-11-19: qty 100

## 2019-11-19 MED ORDER — IOHEXOL 300 MG/ML  SOLN
INTRAMUSCULAR | Status: DC | PRN
  Administered 2019-11-19: 8 mL

## 2019-11-19 MED ORDER — DEXAMETHASONE SODIUM PHOSPHATE 10 MG/ML IJ SOLN
INTRAMUSCULAR | Status: DC | PRN
Start: 2019-11-19 — End: 2019-11-19
  Administered 2019-11-19: 10 mg via INTRAVENOUS

## 2019-11-19 MED ORDER — FENTANYL CITRATE (PF) 100 MCG/2ML IJ SOLN
INTRAMUSCULAR | Status: DC | PRN
  Administered 2019-11-19 (×5): 50 ug via INTRAVENOUS

## 2019-11-19 MED ORDER — ONDANSETRON HCL 4 MG/2ML IJ SOLN
INTRAMUSCULAR | Status: AC
  Filled 2019-11-19: qty 2

## 2019-11-19 MED ORDER — HYDROMORPHONE HCL 1 MG/ML IJ SOLN: 0.2500 mg | INTRAMUSCULAR | Status: DC | PRN

## 2019-11-19 MED ORDER — MIDAZOLAM HCL 2 MG/2ML IJ SOLN: 0.5000 mg | Freq: Once | INTRAMUSCULAR | Status: DC | PRN

## 2019-11-19 MED ORDER — OXYCODONE HCL 5 MG PO TABS: 5.0000 mg | ORAL_TABLET | Freq: Once | ORAL | Status: DC | PRN

## 2019-11-19 MED ORDER — SODIUM CHLORIDE 0.9 % IV SOLN: INTRAVENOUS | Status: DC

## 2019-11-19 MED ORDER — LIDOCAINE HCL (CARDIAC) PF 50 MG/5ML IV SOSY
PREFILLED_SYRINGE | INTRAVENOUS | Status: DC | PRN
  Administered 2019-11-19: 50 mg via INTRAVENOUS

## 2019-11-19 MED ORDER — OXYCODONE HCL 5 MG/5ML PO SOLN: 5.0000 mg | Freq: Once | ORAL | Status: DC | PRN

## 2019-11-19 MED ORDER — METHOCARBAMOL 500 MG PO TABS: 500.0000 mg | ORAL_TABLET | Freq: Three times a day (TID) | ORAL | Status: DC | PRN

## 2019-11-19 MED ORDER — ONDANSETRON HCL 4 MG/2ML IJ SOLN
INTRAMUSCULAR | Status: DC | PRN
  Administered 2019-11-19: 4 mg via INTRAVENOUS

## 2019-11-19 MED ORDER — ONDANSETRON HCL 4 MG/2ML IJ SOLN: 4.0000 mg | INTRAMUSCULAR | Status: DC | PRN

## 2019-11-19 MED ORDER — MEPERIDINE HCL 50 MG/ML IJ SOLN: 6.2500 mg | INTRAMUSCULAR | Status: DC | PRN

## 2019-11-19 MED ORDER — LIDOCAINE 2% (20 MG/ML) 5 ML SYRINGE
INTRAMUSCULAR | Status: AC
  Filled 2019-11-19: qty 5

## 2019-11-19 MED ORDER — FUROSEMIDE 40 MG PO TABS: 20.0000 mg | ORAL_TABLET | Freq: Every day | ORAL | Status: DC

## 2019-11-19 MED ORDER — 0.9 % SODIUM CHLORIDE (POUR BTL) OPTIME
TOPICAL | Status: DC | PRN
  Administered 2019-11-19: 1000 mL

## 2019-11-19 MED ORDER — SCOPOLAMINE 1 MG/3DAYS TD PT72
MEDICATED_PATCH | TRANSDERMAL | Status: AC
  Filled 2019-11-19: qty 1

## 2019-11-19 MED ORDER — SUCCINYLCHOLINE CHLORIDE 200 MG/10ML IV SOSY
PREFILLED_SYRINGE | INTRAVENOUS | Status: AC
  Filled 2019-11-19: qty 10

## 2019-11-19 SURGICAL SUPPLY — 15 items
BAG URO CATCHER STRL LF (MISCELLANEOUS) ×2 IMPLANT
CATH INTERMIT  6FR 70CM (CATHETERS) ×2 IMPLANT
CLOTH BEACON ORANGE TIMEOUT ST (SAFETY) ×2 IMPLANT
FIBER LASER TRAC TIP (UROLOGICAL SUPPLIES) ×1 IMPLANT
GLOVE BIO SURGEON STRL SZ7.5 (GLOVE) ×2 IMPLANT
GOWN STRL REUS W/TWL LRG LVL3 (GOWN DISPOSABLE) ×4 IMPLANT
GOWN STRL REUS W/TWL XL LVL3 (GOWN DISPOSABLE) ×2 IMPLANT
GUIDEWIRE STR DUAL SENSOR (WIRE) ×3 IMPLANT
KIT TURNOVER KIT A (KITS) IMPLANT
MANIFOLD NEPTUNE II (INSTRUMENTS) ×2 IMPLANT
SHEATH URETERAL 12FRX35CM (MISCELLANEOUS) ×1 IMPLANT
STENT URET 6FRX26 CONTOUR (STENTS) ×1 IMPLANT
TRAY CYSTO PACK (CUSTOM PROCEDURE TRAY) ×2 IMPLANT
TUBING CONNECTING 10 (TUBING) ×2 IMPLANT
TUBING UROLOGY SET (TUBING) ×1 IMPLANT

## 2019-11-19 NOTE — Discharge Summary (Signed)
Physician Discharge Summary  Patient ID: Raymond Bowen MRN: [id number] DOB/AGE: 03-27-1976 44 y.o.  Admit date: 11/18/2019 Discharge date: 11/19/2019  Admission Diagnoses:  Discharge Diagnoses:  Active Problems:   Ureteral calculus   Discharged Condition: good  Hospital Course: Patient presented with an obstructing right ureteral stone.  Pain was uncontrolled.  He was admitted to the hospital and the next day underwent right ureteroscopy with laser lithotripsy and ureteral stent placement.  He was discharged home in stable condition.  Consults: None  Significant Diagnostic Studies: None  Treatments: surgery: As above  Discharge Exam: Blood pressure (!) 128/106, pulse (!) 55, temperature 98.8 F (37.1 C), temperature source Oral, resp. rate 18, SpO2 99 %. General appearance: alert no acute distress Adequate perfusion of extremities Nonlabored respiration Abdomen soft nontender nondistended  Disposition: Discharge disposition: 01-Home or Self Care        Allergies as of 11/19/2019      Reactions   Shellfish Allergy Anaphylaxis, Swelling, Other (See Comments)   Crab legs, seafood    Vancomycin Hives, Rash      Medication List    TAKE these medications   doxycycline 100 MG capsule Commonly known as: VIBRAMYCIN Take 1 capsule (100 mg total) by mouth 2 (two) times daily.   DULoxetine 60 MG capsule Commonly known as: CYMBALTA Take 1 capsule (60 mg total) by mouth daily.   furosemide 20 MG tablet Commonly known as: LASIX Take 1 tablet (20 mg total) by mouth daily. X 5 days Prn leg swelling   HYDROcodone-acetaminophen 5-325 MG tablet Commonly known as: Norco Take 1 tablet by mouth every 4 (four) hours as needed for moderate pain.   methocarbamol 500 MG tablet Commonly known as: Robaxin Take 1 tablet (500 mg total) by mouth every 8 (eight) hours as needed for muscle spasms.   pregabalin 100 MG capsule Commonly known as: LYRICA Take 1 capsule (100 mg total)  by mouth 2 (two) times daily.   Rivaroxaban 15 MG Tabs tablet Commonly known as: XARELTO Take 1 tablet (15 mg total) by mouth daily with supper.      Follow-up will be arranged.  If you have not heard from [facility] by Tuesday please call [phone]  Signed: Ray Church, [name] 11/19/2019, 3:07 PM

## 2019-11-19 NOTE — Op Note (Signed)
Operative Note  Preoperative diagnosis:  1.  Right ureteral calculus  Postoperative diagnosis: 1.  Right ureteral calculus  Procedure(s): 1.  Cystoscopy with right retrograde pyelogram, right ureteroscopy with laser lithotripsy, ureteral stent placement  Surgeon: Modena Slater, MD  Assistants: None  Anesthesia: General  Complications: None immediate  EBL: Minimal  Specimens: 1.  None  Drains/Catheters: 1.  6 x 26 double-J ureteral stent  Intraoperative findings: 1.  Normal urethra 2.  Normal bladder mucosa 3.  Right retrograde pyelogram revealed moderate hydronephrosis. 4.  Ureteroscopy revealed an approximately 7 mm calculus that was fragmented to tiny fragments.  Indication: 44 year old male with pain refractory to pain medication and a right ureteral calculus presents for the previously mentioned operation  Description of procedure:  The patient was identified and consent was obtained.  The patient was taken to the operating room and placed in the supine position.  The patient was placed under general anesthesia.  Perioperative antibiotics were administered.  The patient was placed in dorsal lithotomy.  Patient was prepped and draped in a standard sterile fashion and a timeout was performed.  A 21 French rigid cystoscope was advanced into the urethra and into the bladder.  Complete cystoscopy was performed with no abnormal findings.  The right ureter was cannulated with a sensor wire which was advanced up to the kidney under fluoroscopic guidance.  I advanced a semirigid ureteroscope alongside this up to the renal pelvis and no stone was seen.  I shot a retrograde pyelogram through the scope with the findings noted above.  I passed a second wire through the scope and into the kidney under fluoroscopic guidance and withdrew the scope.  I then secured one of the wires to the drape as a safety wire.  The other wire was used to advance the inner portion of a 12 x 14 access sheath  over the wire under continuous fluoroscopic guidance up the ureter.  I then passed the entire sheath over the wire under continuous fluoroscopic guidance with minimal resistance.  I withdrew the inner sheath along with the wire and performed flexible ureteroscopy.  The stone was encountered which was fragmented to tiny fragments.  I inspected the entire kidney and no other clinically significant stone fragments were seen.  I withdrew the scope along with the access sheath visualizing the entire ureter upon removal.  There was no obvious ureteral injury and no ureteral calculi.  I backloaded the wire onto a rigid cystoscope and advanced that into the bladder followed by routine placement of a 6 x 26 double-J ureteral stent.  Fluoroscopy confirmed proximal placement and direct visualization confirmed a good coil within the bladder.  I drained the bladder withdrew the scope.  Patient tolerated the procedure well and was stable postoperative.  Plan: Return in 1 week for stent removal.

## 2019-11-19 NOTE — Anesthesia Postprocedure Evaluation (Signed)
Anesthesia Post Note  Patient: Raymond Bowen  Procedure(s) Performed: Cystoscopy/Retrograde/Right Uretroscopy With Laser Lithotripsy/Ureteral Stent Placement (Right )     Patient location during evaluation: PACU Anesthesia Type: General Level of consciousness: awake and alert, oriented and patient cooperative Pain management: pain level controlled Vital Signs Assessment: post-procedure vital signs reviewed and stable Respiratory status: spontaneous breathing, nonlabored ventilation and respiratory function stable Cardiovascular status: blood pressure returned to baseline and stable Postop Assessment: no apparent nausea or vomiting and adequate PO intake Anesthetic complications: no Comments: Pt has hard nodule at jaw line, 1.5cm diam. He agrees to follow-up with his PCP if it still present in 2 weeks    Last Vitals:  Vitals:   11/19/19 1515 11/19/19 1530  BP: (!) 129/94 (!) 128/95  Pulse: 71 (!) 57  Resp: 14 13  Temp:    SpO2: 100% 100%    Last Pain:  Vitals:   11/19/19 1530  TempSrc:   PainSc: 0-No pain                 Kevonte Vanecek,E. Daily Crate

## 2019-11-19 NOTE — Anesthesia Procedure Notes (Signed)
Procedure Name: Intubation Date/Time: 11/19/2019 2:06 PM Performed by: Lissa Morales, CRNA Pre-anesthesia Checklist: Patient identified, Emergency Drugs available, Suction available and Patient being monitored Patient Re-evaluated:Patient Re-evaluated prior to induction Oxygen Delivery Method: Circle system utilized Preoxygenation: Pre-oxygenation with 100% oxygen Induction Type: IV induction, Rapid sequence and Cricoid Pressure applied Laryngoscope Size: Mac and 4 Grade View: Grade II Tube type: Oral Tube size: 7.5 mm Number of attempts: 1 Airway Equipment and Method: Stylet and Oral airway Placement Confirmation: ETT inserted through vocal cords under direct vision,  positive ETCO2 and breath sounds checked- equal and bilateral Secured at: 22 cm Tube secured with: Tape Dental Injury: Teeth and Oropharynx as per pre-operative assessment

## 2019-11-19 NOTE — Interval H&P Note (Signed)
History and Physical Interval Note:  11/19/2019 1:44 PM  Raymond Bowen  has presented today for surgery, with the diagnosis of Right Ureteral Stone.  The various methods of treatment have been discussed with the patient and family. After consideration of risks, benefits and other options for treatment, the patient has consented to  Procedure(s): Right Uretroscopy With Laser Lithotripsy With Ureteroscopy Ureteral Stent Placement (Right) as a surgical intervention.  The patient's history has been reviewed, patient examined, no change in status, stable for surgery.  I have reviewed the patient's chart and labs.  Questions were answered to the patient's satisfaction.     Ray Church, [name]

## 2019-11-19 NOTE — ED Notes (Signed)
Per pt, states he wants to see the surgeon, does not want to have surgery-wants to go home

## 2019-11-19 NOTE — ED Notes (Signed)
Pt inquiring when he will be going to surgery. Pt informed that the OR will call when they are ready for him.
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
## 2019-11-19 NOTE — Discharge Instructions (Signed)

## 2019-11-19 NOTE — ED Notes (Signed)
Pt provided CHG cloths
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
## 2019-11-19 NOTE — ED Notes (Signed)
Pt changed into gown at this time. Pt brushing teeth at this time. Pts earrings placed in cup and placed in belongings bag

## 2019-11-19 NOTE — Transfer of Care (Signed)
Immediate Anesthesia Transfer of Care Note  Patient: Raymond Bowen  Procedure(s) Performed: Cystoscopy/Retrograde/Right Uretroscopy With Laser Lithotripsy/Ureteral Stent Placement (Right )  Patient Location: PACU  Anesthesia Type:General  Level of Consciousness: awake, alert , oriented and patient cooperative  Airway & Oxygen Therapy: Patient Spontanous Breathing and Patient connected to face mask oxygen  Post-op Assessment: Report given to RN, Post -op Vital signs reviewed and stable and Patient moving all extremities X 4  Post vital signs: stable  Last Vitals:  Vitals Value Taken Time  BP 122/91 11/19/19 1501  Temp    Pulse 59 11/19/19 1508  Resp 11 11/19/19 1508  SpO2 100 % 11/19/19 1508  Vitals shown include unvalidated device data.  Last Pain:  Vitals:   11/19/19 1236  TempSrc: Oral  PainSc:          Complications: No apparent anesthesia complications

## 2019-11-23 ENCOUNTER — Other Ambulatory Visit: Payer: Medicare Other

## 2019-11-30 ENCOUNTER — Other Ambulatory Visit: Payer: Medicare Other

## 2020-01-01 ENCOUNTER — Other Ambulatory Visit: Payer: Self-pay

## 2020-01-01 ENCOUNTER — Emergency Department (HOSPITAL_COMMUNITY)
Admission: EM | Admit: 2020-01-01 | Discharge: 2020-01-01 | Disposition: A | Discharge status: Home / Self Care | Payer: Medicare Other | Attending: Emergency Medicine | Admitting: Emergency Medicine

## 2020-01-01 ENCOUNTER — Encounter (HOSPITAL_COMMUNITY): Payer: Self-pay | Admitting: *Deleted

## 2020-01-01 DIAGNOSIS — Z433 Encounter for attention to colostomy: Secondary | ICD-10-CM

## 2020-01-01 DIAGNOSIS — Z87891 Personal history of nicotine dependence: Secondary | ICD-10-CM | POA: Insufficient documentation
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
## 2020-01-01 NOTE — Discharge Instructions (Addendum)
Please follow-up with your primary care doctor for colostomy supplies.  Please inform the medical supplier of your new address.

## 2020-01-01 NOTE — ED Triage Notes (Signed)
Wants replacement of leaking colostomy bag, states his supplies are not yet in to replace it himself.

## 2020-01-01 NOTE — ED Notes (Signed)
Colostomy bag replaced.

## 2020-01-06 ENCOUNTER — Encounter: Payer: Self-pay | Admitting: Surgery

## 2020-01-08 ENCOUNTER — Other Ambulatory Visit: Payer: Self-pay

## 2020-01-08 ENCOUNTER — Encounter (HOSPITAL_COMMUNITY): Payer: Self-pay

## 2020-01-08 ENCOUNTER — Emergency Department (HOSPITAL_COMMUNITY)
Admission: EM | Admit: 2020-01-08 | Discharge: 2020-01-09 | Disposition: A | Discharge status: Home / Self Care | Payer: Medicare Other | Attending: Emergency Medicine | Admitting: Emergency Medicine

## 2020-01-08 DIAGNOSIS — K9422 Gastrostomy infection: Secondary | ICD-10-CM | POA: Diagnosis not present

## 2020-01-08 DIAGNOSIS — Z5321 Procedure and treatment not carried out due to patient leaving prior to being seen by health care provider: Secondary | ICD-10-CM | POA: Insufficient documentation

## 2020-01-08 DIAGNOSIS — Z933 Colostomy status: Secondary | ICD-10-CM | POA: Diagnosis not present

## 2020-01-08 DIAGNOSIS — Z433 Encounter for attention to colostomy: Secondary | ICD-10-CM | POA: Insufficient documentation

## 2020-01-08 NOTE — ED Triage Notes (Signed)
Arrived POV. Patient arrives to ED for replacement of colostomy supplies.

## 2020-01-09 ENCOUNTER — Other Ambulatory Visit: Payer: Self-pay

## 2020-01-09 ENCOUNTER — Emergency Department (HOSPITAL_COMMUNITY)
Admission: EM | Admit: 2020-01-09 | Discharge: 2020-01-09 | Disposition: A | Discharge status: Home / Self Care | Payer: Medicare Other | Source: Home / Self Care | Attending: Emergency Medicine | Admitting: Emergency Medicine

## 2020-01-09 ENCOUNTER — Encounter (HOSPITAL_COMMUNITY): Payer: Self-pay | Admitting: Emergency Medicine

## 2020-01-09 DIAGNOSIS — R5381 Other malaise: Secondary | ICD-10-CM | POA: Diagnosis not present

## 2020-01-09 DIAGNOSIS — K9422 Gastrostomy infection: Secondary | ICD-10-CM | POA: Diagnosis not present

## 2020-01-09 DIAGNOSIS — Z433 Encounter for attention to colostomy: Secondary | ICD-10-CM | POA: Diagnosis not present

## 2020-01-09 DIAGNOSIS — R52 Pain, unspecified: Secondary | ICD-10-CM | POA: Diagnosis not present

## 2020-01-09 DIAGNOSIS — Z933 Colostomy status: Secondary | ICD-10-CM | POA: Diagnosis not present

## 2020-01-09 DIAGNOSIS — R238 Other skin changes: Secondary | ICD-10-CM

## 2020-01-09 LAB — CBC WITH DIFFERENTIAL/PLATELET
Abs Immature Granulocytes: 0.08 10*3/uL — ABNORMAL HIGH (ref 0.00–0.07)
Basophils Absolute: 0.1 10*3/uL (ref 0.0–0.1)
Basophils Relative: 1 %
Eosinophils Absolute: 0.7 10*3/uL — ABNORMAL HIGH (ref 0.0–0.5)
Eosinophils Relative: 5 %
HCT: 39.3 % (ref 39.0–52.0)
Hemoglobin: 12.3 g/dL — ABNORMAL LOW (ref 13.0–17.0)
Immature Granulocytes: 1 %
Lymphocytes Relative: 14 %
Lymphs Abs: 1.9 10*3/uL (ref 0.7–4.0)
MCH: 32.4 pg (ref 26.0–34.0)
MCHC: 31.3 g/dL (ref 30.0–36.0)
MCV: 103.4 fL — ABNORMAL HIGH (ref 80.0–100.0)
Monocytes Absolute: 1.4 10*3/uL — ABNORMAL HIGH (ref 0.1–1.0)
Monocytes Relative: 10 %
Neutro Abs: 9.5 10*3/uL — ABNORMAL HIGH (ref 1.7–7.7)
Neutrophils Relative %: 69 %
Platelets: 263 10*3/uL (ref 150–400)
RBC: 3.8 MIL/uL — ABNORMAL LOW (ref 4.22–5.81)
RDW: 14.5 % (ref 11.5–15.5)
WBC: 13.6 10*3/uL — ABNORMAL HIGH (ref 4.0–10.5)
nRBC: 0 % (ref 0.0–0.2)

## 2020-01-09 LAB — BASIC METABOLIC PANEL
Anion gap: 8 (ref 5–15)
BUN: 21 mg/dL — ABNORMAL HIGH (ref 6–20)
CO2: 19 mmol/L — ABNORMAL LOW (ref 22–32)
Calcium: 9 mg/dL (ref 8.9–10.3)
Chloride: 109 mmol/L (ref 98–111)
Creatinine, Ser: 2.42 mg/dL — ABNORMAL HIGH (ref 0.61–1.24)
GFR calc Af Amer: 37 mL/min — ABNORMAL LOW (ref >60)
GFR calc non Af Amer: 32 mL/min — ABNORMAL LOW (ref >60)
Glucose, Bld: 98 mg/dL (ref 70–99)
Potassium: 3.8 mmol/L (ref 3.5–5.1)
Sodium: 136 mmol/L (ref 135–145)

## 2020-01-09 LAB — LACTIC ACID, PLASMA: Lactic Acid, Venous: 0.6 mmol/L (ref 0.5–1.9)

## 2020-01-09 MED ORDER — DOXYCYCLINE HYCLATE 100 MG PO CAPS
100.0000 mg | ORAL_CAPSULE | Freq: Two times a day (BID) | ORAL | 0 refills | Status: DC
Start: 2020-01-09 — End: 2020-02-11

## 2020-01-09 MED ORDER — MORPHINE SULFATE (PF) 4 MG/ML IV SOLN: 4.0000 mg | Freq: Once | INTRAVENOUS | Status: DC

## 2020-01-09 MED ORDER — HYDROMORPHONE HCL 1 MG/ML IJ SOLN
1.0000 mg | Freq: Once | INTRAMUSCULAR | Status: AC
  Administered 2020-01-09: 1 mg via INTRAMUSCULAR
  Filled 2020-01-09: qty 1

## 2020-01-09 NOTE — ED Triage Notes (Signed)
Pt arrives via [facility] with complaints of pain at colostomy site and states colostomy bag busted this morning.  Pt went to [facility] last night for colostomy supplies and states he LWBS due to wait.  States his supplies come tomorrow.

## 2020-01-09 NOTE — Consult Note (Signed)
WOC Nurse ostomy consult note  Patient known to the [facility] Nursing service. Last seen on 11/20/19. Today WOC Nurse has provided [facility] numbers in the orders and an urgent request for the Bedside RN to order and obtain supplies to the bedside. Five (5) of each item below is requested:  [facility] # 2   2 3/4" barrier  [facility] [place] # [id number]    2 3/4" pouch [place] # [phone]    2" barrier ring.  Also 1 bottle of ostomy powder [facility] #6)  The patient is able to manage pouching when he has necessary supplies.   He cannot manage supplies as an outpatient despite multiple attempts to assist him.   Thanks, Ladona Mow, MSN, RN, GNP, [place]  Pager# [phone]
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
## 2020-01-09 NOTE — Discharge Instructions (Addendum)
Take antibiotic for possible infection around skin  Contact your medical supplier for future ostomy supplies  Return to ER for fever, worsening skin redness swelling warmth pus, vomiting, abdominal pain, ostomy output changes

## 2020-01-10 ENCOUNTER — Encounter (HOSPITAL_COMMUNITY): Payer: Self-pay

## 2020-01-10 ENCOUNTER — Emergency Department (HOSPITAL_COMMUNITY)
Admission: EM | Admit: 2020-01-10 | Discharge: 2020-01-10 | Disposition: A | Discharge status: Home / Self Care | Payer: Medicare Other | Attending: Emergency Medicine | Admitting: Emergency Medicine

## 2020-01-10 ENCOUNTER — Other Ambulatory Visit: Payer: Self-pay

## 2020-01-10 DIAGNOSIS — Z433 Encounter for attention to colostomy: Secondary | ICD-10-CM | POA: Diagnosis not present

## 2020-01-10 DIAGNOSIS — Z87891 Personal history of nicotine dependence: Secondary | ICD-10-CM | POA: Diagnosis not present

## 2020-01-10 DIAGNOSIS — Z432 Encounter for attention to ileostomy: Secondary | ICD-10-CM | POA: Insufficient documentation

## 2020-01-10 DIAGNOSIS — Z7189 Other specified counseling: Secondary | ICD-10-CM

## 2020-01-10 NOTE — ED Triage Notes (Signed)
Pt states he is here to receive ostomy supplies. Pt states he is supposed to receive supplies from a [facility] today but not until 5 pm and he needs a new ostomy bag now. Pt has no medical complaints at this time. Pt denies pain.

## 2020-01-10 NOTE — Discharge Instructions (Addendum)
Please follow-up with your medical supplier to have your ostomy bags sent directly to your house. Please begin taking the antibiotics that you are prescribed.
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
## 2020-01-10 NOTE — ED Notes (Signed)
An After Visit Summary was printed and given to the patient. Discharge instructions given and no further questions at this time.  

## 2020-01-11 ENCOUNTER — Telehealth: Payer: Self-pay | Admitting: *Deleted

## 2020-01-11 NOTE — Progress Notes (Signed)
TOC CM received call from pt stating he did not receive a pouch for colostomy bag. Spoke to [employer] ED RN and will leave one at the Security office for his girlfriend to pick up. Pt states he has bags coming with [employer] on tomorrow. Isidoro Donning RN CCM, [facility] ED TOC CM [phone]

## 2020-01-26 ENCOUNTER — Telehealth: Payer: Self-pay | Admitting: Family Medicine

## 2020-01-26 ENCOUNTER — Emergency Department (HOSPITAL_COMMUNITY)
Admission: EM | Admit: 2020-01-26 | Discharge: 2020-01-26 | Disposition: A | Discharge status: Home / Self Care | Payer: Medicare Other | Attending: Emergency Medicine | Admitting: Emergency Medicine

## 2020-01-26 DIAGNOSIS — Z87891 Personal history of nicotine dependence: Secondary | ICD-10-CM | POA: Insufficient documentation

## 2020-01-26 DIAGNOSIS — R5381 Other malaise: Secondary | ICD-10-CM | POA: Diagnosis not present

## 2020-01-26 DIAGNOSIS — R52 Pain, unspecified: Secondary | ICD-10-CM | POA: Diagnosis not present

## 2020-01-26 DIAGNOSIS — Z433 Encounter for attention to colostomy: Secondary | ICD-10-CM | POA: Insufficient documentation

## 2020-01-26 DIAGNOSIS — Z7189 Other specified counseling: Secondary | ICD-10-CM
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
## 2020-01-26 NOTE — Telephone Encounter (Signed)
Patient called in and requested to speak with care management coordinator. Please follow up at your earliest convenience.
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
## 2020-01-26 NOTE — ED Notes (Signed)
Patient provided with bus pass and sandwich at time of discharge.

## 2020-01-26 NOTE — ED Notes (Signed)
Patient provided with supplies to change ostomy bag.

## 2020-01-26 NOTE — Telephone Encounter (Signed)
This patient called back again.  He was looking for colostomy bags and the front office informed him that this clinic did not have any samples for him

## 2020-01-29 ENCOUNTER — Other Ambulatory Visit: Payer: Self-pay

## 2020-01-29 ENCOUNTER — Emergency Department (HOSPITAL_COMMUNITY)
Admission: EM | Admit: 2020-01-29 | Discharge: 2020-01-29 | Disposition: A | Discharge status: Home / Self Care | Payer: Medicare Other | Attending: Emergency Medicine | Admitting: Emergency Medicine

## 2020-01-29 ENCOUNTER — Emergency Department (HOSPITAL_BASED_OUTPATIENT_CLINIC_OR_DEPARTMENT_OTHER): Payer: Medicare Other

## 2020-01-29 ENCOUNTER — Encounter (HOSPITAL_COMMUNITY): Payer: Self-pay | Admitting: Emergency Medicine

## 2020-01-29 DIAGNOSIS — R2241 Localized swelling, mass and lump, right lower limb: Secondary | ICD-10-CM | POA: Diagnosis present

## 2020-01-29 DIAGNOSIS — Z87891 Personal history of nicotine dependence: Secondary | ICD-10-CM | POA: Insufficient documentation

## 2020-01-29 DIAGNOSIS — R52 Pain, unspecified: Secondary | ICD-10-CM | POA: Diagnosis not present

## 2020-01-29 DIAGNOSIS — I825Z1 Chronic embolism and thrombosis of unspecified deep veins of right distal lower extremity: Secondary | ICD-10-CM | POA: Insufficient documentation

## 2020-01-29 DIAGNOSIS — M7989 Other specified soft tissue disorders: Secondary | ICD-10-CM

## 2020-01-29 DIAGNOSIS — Z79899 Other long term (current) drug therapy: Secondary | ICD-10-CM | POA: Insufficient documentation

## 2020-01-29 DIAGNOSIS — M792 Neuralgia and neuritis, unspecified: Secondary | ICD-10-CM

## 2020-01-29 DIAGNOSIS — Z89021 Acquired absence of right finger(s): Secondary | ICD-10-CM | POA: Diagnosis not present

## 2020-01-29 DIAGNOSIS — R609 Edema, unspecified: Secondary | ICD-10-CM | POA: Diagnosis not present

## 2020-01-29 DIAGNOSIS — Z932 Ileostomy status: Secondary | ICD-10-CM | POA: Diagnosis not present

## 2020-01-29 DIAGNOSIS — I82511 Chronic embolism and thrombosis of right femoral vein: Secondary | ICD-10-CM

## 2020-01-29 DIAGNOSIS — M79609 Pain in unspecified limb: Secondary | ICD-10-CM

## 2020-01-29 DIAGNOSIS — M21371 Foot drop, right foot: Secondary | ICD-10-CM

## 2020-01-29 LAB — BASIC METABOLIC PANEL
Anion gap: 13 (ref 5–15)
BUN: 15 mg/dL (ref 6–20)
CO2: 23 mmol/L (ref 22–32)
Calcium: 8.7 mg/dL — ABNORMAL LOW (ref 8.9–10.3)
Chloride: 101 mmol/L (ref 98–111)
Creatinine, Ser: 2.41 mg/dL — ABNORMAL HIGH (ref 0.61–1.24)
GFR calc Af Amer: 37 mL/min — ABNORMAL LOW (ref >60)
GFR calc non Af Amer: 32 mL/min — ABNORMAL LOW (ref >60)
Glucose, Bld: 104 mg/dL — ABNORMAL HIGH (ref 70–99)
Potassium: 3.3 mmol/L — ABNORMAL LOW (ref 3.5–5.1)
Sodium: 137 mmol/L (ref 135–145)

## 2020-01-29 LAB — CBC
HCT: 40.8 % (ref 39.0–52.0)
Hemoglobin: 13.5 g/dL (ref 13.0–17.0)
MCH: 33.1 pg (ref 26.0–34.0)
MCHC: 33.1 g/dL (ref 30.0–36.0)
MCV: 100 fL (ref 80.0–100.0)
Platelets: 253 10*3/uL (ref 150–400)
RBC: 4.08 MIL/uL — ABNORMAL LOW (ref 4.22–5.81)
RDW: 14.1 % (ref 11.5–15.5)
WBC: 13.2 10*3/uL — ABNORMAL HIGH (ref 4.0–10.5)
nRBC: 0 % (ref 0.0–0.2)

## 2020-01-29 MED ORDER — RIVAROXABAN 15 MG PO TABS
15.0000 mg | ORAL_TABLET | Freq: Once | ORAL | Status: AC
  Administered 2020-01-29: 15 mg via ORAL
  Filled 2020-01-29: qty 1

## 2020-01-29 MED ORDER — RIVAROXABAN 15 MG PO TABS: 15.0000 mg | ORAL_TABLET | Freq: Every day | ORAL | 0 refills | Status: DC
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
## 2020-01-29 NOTE — Progress Notes (Addendum)
VASCULAR LAB    Right lower extremity venous duplex completed.    Preliminary report:  See CV proc for preliminary results.  Called report to Dr. Trude Mcburney, [facility], RVT 01/29/2020, 8:10 AM

## 2020-02-10 ENCOUNTER — Emergency Department (HOSPITAL_COMMUNITY)
Admission: EM | Admit: 2020-02-10 | Discharge: 2020-02-11 | Disposition: A | Discharge status: Home / Self Care | Payer: Medicare Other | Attending: Emergency Medicine | Admitting: Emergency Medicine

## 2020-02-10 ENCOUNTER — Other Ambulatory Visit: Payer: Self-pay

## 2020-02-10 DIAGNOSIS — R319 Hematuria, unspecified: Secondary | ICD-10-CM | POA: Diagnosis present

## 2020-02-10 DIAGNOSIS — L299 Pruritus, unspecified: Secondary | ICD-10-CM | POA: Insufficient documentation

## 2020-02-10 DIAGNOSIS — N390 Urinary tract infection, site not specified: Secondary | ICD-10-CM | POA: Insufficient documentation

## 2020-02-10 DIAGNOSIS — M7989 Other specified soft tissue disorders: Secondary | ICD-10-CM | POA: Diagnosis not present

## 2020-02-10 DIAGNOSIS — Z87891 Personal history of nicotine dependence: Secondary | ICD-10-CM | POA: Insufficient documentation

## 2020-02-10 DIAGNOSIS — Z79899 Other long term (current) drug therapy: Secondary | ICD-10-CM | POA: Diagnosis not present

## 2020-02-10 LAB — URINALYSIS, ROUTINE W REFLEX MICROSCOPIC
Bilirubin Urine: NEGATIVE
Glucose, UA: NEGATIVE mg/dL
Ketones, ur: NEGATIVE mg/dL
Nitrite: NEGATIVE
Protein, ur: 100 mg/dL — AB
RBC / HPF: >50 RBC/hpf — ABNORMAL HIGH (ref 0–5)
Specific Gravity, Urine: 1.01 (ref 1.005–1.030)
WBC, UA: >50 WBC/hpf — ABNORMAL HIGH (ref 0–5)
pH: 6 (ref 5.0–8.0)

## 2020-02-10 NOTE — ED Triage Notes (Signed)
Pt endorses hematuria, penile pain, and urinary frequency x 2 days. Also endorses itching all over, thinks it is from a medication he is taking.

## 2020-02-11 DIAGNOSIS — N390 Urinary tract infection, site not specified: Secondary | ICD-10-CM | POA: Diagnosis not present

## 2020-02-11 DIAGNOSIS — M7989 Other specified soft tissue disorders: Secondary | ICD-10-CM | POA: Diagnosis not present

## 2020-02-11 MED ORDER — CEPHALEXIN 500 MG PO CAPS: ORAL_CAPSULE | ORAL | 0 refills | Status: DC

## 2020-02-11 MED ORDER — CEPHALEXIN 250 MG PO CAPS
500.0000 mg | ORAL_CAPSULE | Freq: Once | ORAL | Status: AC
  Administered 2020-02-11: 500 mg via ORAL
  Filled 2020-02-11: qty 2

## 2020-02-11 MED ORDER — HYDROXYZINE HCL 25 MG PO TABS
25.0000 mg | ORAL_TABLET | Freq: Once | ORAL | Status: AC
  Administered 2020-02-11: 25 mg via ORAL
  Filled 2020-02-11: qty 1

## 2020-02-11 MED ORDER — HYDROXYZINE HCL 25 MG PO TABS
25.0000 mg | ORAL_TABLET | Freq: Four times a day (QID) | ORAL | 0 refills | Status: DC | PRN
Start: 2020-02-11 — End: 2020-07-15

## 2020-02-11 NOTE — ED Notes (Signed)
Provided pt with a bagged lunch and can of Sprite

## 2020-02-11 NOTE — ED Notes (Signed)
No answer for VS x1 

## 2020-02-11 NOTE — Discharge Instructions (Addendum)
No blood in your urine appears to be from an infection.  We are prescribing cephalexin, antibiotic to help that.  Try to drink lots of water every day to improve the symptoms.  The itching is nonspecific and should be improved with the medicine, hydroxyzine which was prescribed.  See your doctor for checkup in a week or so to make sure that is getting better.  If not she will need to do some additional testing and treatment.  The swelling in your right leg is from the prior DVT.  At this point there is nothing that can be done to help it get better quickly.  Continue taking your blood thinner medications.  Try to get some regular daily exercise to improve your leg strength, and flexibility.
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
## 2020-02-12 LAB — URINE CULTURE
Culture: NO GROWTH
Special Requests: NORMAL

## 2020-02-14 ENCOUNTER — Other Ambulatory Visit: Payer: Self-pay

## 2020-02-14 ENCOUNTER — Emergency Department (HOSPITAL_COMMUNITY)
Admission: EM | Admit: 2020-02-14 | Discharge: 2020-02-14 | Disposition: A | Discharge status: Home / Self Care | Payer: Medicare Other | Attending: Emergency Medicine | Admitting: Emergency Medicine

## 2020-02-14 ENCOUNTER — Encounter (HOSPITAL_COMMUNITY): Payer: Self-pay | Admitting: Emergency Medicine

## 2020-02-14 DIAGNOSIS — Z87891 Personal history of nicotine dependence: Secondary | ICD-10-CM | POA: Diagnosis not present

## 2020-02-14 DIAGNOSIS — Z433 Encounter for attention to colostomy: Secondary | ICD-10-CM | POA: Diagnosis not present

## 2020-02-14 DIAGNOSIS — Z7901 Long term (current) use of anticoagulants: Secondary | ICD-10-CM | POA: Insufficient documentation

## 2020-02-14 DIAGNOSIS — R21 Rash and other nonspecific skin eruption: Secondary | ICD-10-CM | POA: Insufficient documentation

## 2020-02-14 DIAGNOSIS — Z7189 Other specified counseling: Secondary | ICD-10-CM
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
## 2020-02-14 NOTE — Discharge Instructions (Signed)
Please use the outpatient services provided for further ostomy care and replacement.  Please make sure to keep your appointment with [facility].  Return to the ER if your symptoms worsen.
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
## 2020-02-17 ENCOUNTER — Other Ambulatory Visit: Payer: Self-pay

## 2020-02-17 ENCOUNTER — Emergency Department (HOSPITAL_COMMUNITY)
Admission: EM | Admit: 2020-02-17 | Discharge: 2020-02-17 | Disposition: A | Discharge status: Home / Self Care | Payer: Medicare Other | Attending: Emergency Medicine | Admitting: Emergency Medicine

## 2020-02-17 ENCOUNTER — Encounter (HOSPITAL_COMMUNITY): Payer: Self-pay | Admitting: Emergency Medicine

## 2020-02-17 DIAGNOSIS — I825Z1 Chronic embolism and thrombosis of unspecified deep veins of right distal lower extremity: Secondary | ICD-10-CM | POA: Insufficient documentation

## 2020-02-17 DIAGNOSIS — R319 Hematuria, unspecified: Secondary | ICD-10-CM | POA: Insufficient documentation

## 2020-02-17 DIAGNOSIS — Z789 Other specified health status: Secondary | ICD-10-CM

## 2020-02-17 DIAGNOSIS — I824Z1 Acute embolism and thrombosis of unspecified deep veins of right distal lower extremity: Secondary | ICD-10-CM | POA: Diagnosis not present

## 2020-02-17 DIAGNOSIS — R52 Pain, unspecified: Secondary | ICD-10-CM | POA: Diagnosis not present

## 2020-02-17 DIAGNOSIS — Z9119 Patient's noncompliance with other medical treatment and regimen: Secondary | ICD-10-CM | POA: Diagnosis not present

## 2020-02-17 DIAGNOSIS — Z87891 Personal history of nicotine dependence: Secondary | ICD-10-CM | POA: Insufficient documentation

## 2020-02-17 DIAGNOSIS — Z433 Encounter for attention to colostomy: Secondary | ICD-10-CM | POA: Diagnosis not present

## 2020-02-17 DIAGNOSIS — R609 Edema, unspecified: Secondary | ICD-10-CM | POA: Diagnosis not present

## 2020-02-17 DIAGNOSIS — R2241 Localized swelling, mass and lump, right lower limb: Secondary | ICD-10-CM | POA: Diagnosis present

## 2020-02-17 DIAGNOSIS — Z79899 Other long term (current) drug therapy: Secondary | ICD-10-CM | POA: Diagnosis not present

## 2020-02-17 LAB — URINALYSIS, ROUTINE W REFLEX MICROSCOPIC

## 2020-02-17 LAB — CBC WITH DIFFERENTIAL/PLATELET
Abs Immature Granulocytes: 0.11 10*3/uL — ABNORMAL HIGH (ref 0.00–0.07)
Basophils Absolute: 0.1 10*3/uL (ref 0.0–0.1)
Basophils Relative: 1 %
Eosinophils Absolute: 0.9 10*3/uL — ABNORMAL HIGH (ref 0.0–0.5)
Eosinophils Relative: 6 %
HCT: 37.9 % — ABNORMAL LOW (ref 39.0–52.0)
Hemoglobin: 12.3 g/dL — ABNORMAL LOW (ref 13.0–17.0)
Immature Granulocytes: 1 %
Lymphocytes Relative: 17 %
Lymphs Abs: 2.5 10*3/uL (ref 0.7–4.0)
MCH: 33 pg (ref 26.0–34.0)
MCHC: 32.5 g/dL (ref 30.0–36.0)
MCV: 101.6 fL — ABNORMAL HIGH (ref 80.0–100.0)
Monocytes Absolute: 1.5 10*3/uL — ABNORMAL HIGH (ref 0.1–1.0)
Monocytes Relative: 10 %
Neutro Abs: 9.7 10*3/uL — ABNORMAL HIGH (ref 1.7–7.7)
Neutrophils Relative %: 65 %
Platelets: 248 10*3/uL (ref 150–400)
RBC: 3.73 MIL/uL — ABNORMAL LOW (ref 4.22–5.81)
RDW: 13.4 % (ref 11.5–15.5)
WBC: 14.8 10*3/uL — ABNORMAL HIGH (ref 4.0–10.5)
nRBC: 0 % (ref 0.0–0.2)

## 2020-02-17 LAB — URINALYSIS, MICROSCOPIC (REFLEX)
RBC / HPF: >50 RBC/hpf (ref 0–5)
WBC, UA: >50 WBC/hpf (ref 0–5)

## 2020-02-17 LAB — COMPREHENSIVE METABOLIC PANEL
ALT: 26 U/L (ref 0–44)
AST: 32 U/L (ref 15–41)
Albumin: 3.6 g/dL (ref 3.5–5.0)
Alkaline Phosphatase: 70 U/L (ref 38–126)
Anion gap: 11 (ref 5–15)
BUN: 28 mg/dL — ABNORMAL HIGH (ref 6–20)
CO2: 23 mmol/L (ref 22–32)
Calcium: 9.1 mg/dL (ref 8.9–10.3)
Chloride: 104 mmol/L (ref 98–111)
Creatinine, Ser: 2.7 mg/dL — ABNORMAL HIGH (ref 0.61–1.24)
GFR calc Af Amer: 32 mL/min — ABNORMAL LOW (ref >60)
GFR calc non Af Amer: 28 mL/min — ABNORMAL LOW (ref >60)
Glucose, Bld: 103 mg/dL — ABNORMAL HIGH (ref 70–99)
Potassium: 3.2 mmol/L — ABNORMAL LOW (ref 3.5–5.1)
Sodium: 138 mmol/L (ref 135–145)
Total Bilirubin: 0.6 mg/dL (ref 0.3–1.2)
Total Protein: 7.4 g/dL (ref 6.5–8.1)

## 2020-02-17 MED ORDER — OXYCODONE-ACETAMINOPHEN 5-325 MG PO TABS
1.0000 | ORAL_TABLET | Freq: Once | ORAL | Status: AC
  Administered 2020-02-17: 1 via ORAL
  Filled 2020-02-17: qty 1
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
## 2020-02-17 NOTE — ED Notes (Signed)
Patient verbalizes understanding of discharge instructions. Opportunity for questioning and answers were provided. Pt discharged from ED. 

## 2020-02-17 NOTE — ED Notes (Signed)
Pt aware that urine sample is needed, but is unable to provide one at this time 

## 2020-02-17 NOTE — ED Notes (Signed)
Ostomy RN at bedside

## 2020-02-17 NOTE — ED Notes (Signed)
Pt ambulatory with steady gait 
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
## 2020-02-17 NOTE — ED Triage Notes (Addendum)
Pt presents to ED BIB [facility]. Pt c/o bilateral pedal swelling x2d. Pt cannot put shoes on. Pt also c/o torn ostomy bag, seen 3 days ago at [facility] for same.  EMS VS: 120/90 Hr - 100

## 2020-02-17 NOTE — ED Notes (Signed)
[facility] nurse gave pt supplies for ostomy. New ostomy bag placed. Pt due for appt at [facility] on Tuesday.

## 2020-02-17 NOTE — Discharge Instructions (Signed)
Please continue to do routine ostomy care at home the best of your ability.  Follow-up with your primary care doctor regarding any concerns.  In regards to your leg, continue to take Xarelto as previously prescribed.  You may take furosemide as prescribed by your doctor if needed.  Your kidneys today are slightly weaker than usual, so your kidney function will need to be rechecked in the next several days by your primary care doctor.

## 2020-02-19 ENCOUNTER — Other Ambulatory Visit: Payer: Self-pay

## 2020-02-19 ENCOUNTER — Encounter (HOSPITAL_COMMUNITY): Payer: Self-pay | Admitting: Emergency Medicine

## 2020-02-19 DIAGNOSIS — M79661 Pain in right lower leg: Secondary | ICD-10-CM | POA: Diagnosis not present

## 2020-02-19 DIAGNOSIS — Y939 Activity, unspecified: Secondary | ICD-10-CM | POA: Diagnosis not present

## 2020-02-19 DIAGNOSIS — Y999 Unspecified external cause status: Secondary | ICD-10-CM | POA: Diagnosis not present

## 2020-02-19 DIAGNOSIS — Z79899 Other long term (current) drug therapy: Secondary | ICD-10-CM | POA: Insufficient documentation

## 2020-02-19 DIAGNOSIS — Y929 Unspecified place or not applicable: Secondary | ICD-10-CM | POA: Diagnosis not present

## 2020-02-19 DIAGNOSIS — M79671 Pain in right foot: Secondary | ICD-10-CM | POA: Diagnosis not present

## 2020-02-19 DIAGNOSIS — R1084 Generalized abdominal pain: Secondary | ICD-10-CM | POA: Diagnosis not present

## 2020-02-19 DIAGNOSIS — N189 Chronic kidney disease, unspecified: Secondary | ICD-10-CM | POA: Diagnosis not present

## 2020-02-19 DIAGNOSIS — F17210 Nicotine dependence, cigarettes, uncomplicated: Secondary | ICD-10-CM | POA: Insufficient documentation

## 2020-02-19 DIAGNOSIS — S0990XA Unspecified injury of head, initial encounter: Secondary | ICD-10-CM | POA: Insufficient documentation

## 2020-02-19 DIAGNOSIS — S3991XA Unspecified injury of abdomen, initial encounter: Secondary | ICD-10-CM | POA: Diagnosis not present

## 2020-02-19 DIAGNOSIS — Z7901 Long term (current) use of anticoagulants: Secondary | ICD-10-CM | POA: Diagnosis not present

## 2020-02-19 DIAGNOSIS — I129 Hypertensive chronic kidney disease with stage 1 through stage 4 chronic kidney disease, or unspecified chronic kidney disease: Secondary | ICD-10-CM | POA: Diagnosis not present

## 2020-02-19 DIAGNOSIS — M25571 Pain in right ankle and joints of right foot: Secondary | ICD-10-CM | POA: Diagnosis not present

## 2020-02-19 DIAGNOSIS — R52 Pain, unspecified: Secondary | ICD-10-CM | POA: Diagnosis not present

## 2020-02-19 DIAGNOSIS — S31109A Unspecified open wound of abdominal wall, unspecified quadrant without penetration into peritoneal cavity, initial encounter: Secondary | ICD-10-CM | POA: Diagnosis not present
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
## 2020-02-20 ENCOUNTER — Emergency Department (HOSPITAL_COMMUNITY)
Admission: EM | Admit: 2020-02-20 | Discharge: 2020-02-20 | Disposition: A | Discharge status: Home / Self Care | Payer: Medicare Other | Attending: Emergency Medicine | Admitting: Emergency Medicine

## 2020-02-20 ENCOUNTER — Emergency Department (HOSPITAL_COMMUNITY): Payer: Medicare Other

## 2020-02-20 DIAGNOSIS — S0990XA Unspecified injury of head, initial encounter: Secondary | ICD-10-CM | POA: Diagnosis not present

## 2020-02-20 DIAGNOSIS — R1084 Generalized abdominal pain: Secondary | ICD-10-CM | POA: Diagnosis not present

## 2020-02-20 DIAGNOSIS — N189 Chronic kidney disease, unspecified: Secondary | ICD-10-CM | POA: Diagnosis not present

## 2020-02-20 DIAGNOSIS — M79604 Pain in right leg: Secondary | ICD-10-CM

## 2020-02-20 DIAGNOSIS — M25571 Pain in right ankle and joints of right foot: Secondary | ICD-10-CM | POA: Diagnosis not present

## 2020-02-20 DIAGNOSIS — S3991XA Unspecified injury of abdomen, initial encounter: Secondary | ICD-10-CM | POA: Diagnosis not present

## 2020-02-20 DIAGNOSIS — M79671 Pain in right foot: Secondary | ICD-10-CM | POA: Diagnosis not present

## 2020-02-20 DIAGNOSIS — I129 Hypertensive chronic kidney disease with stage 1 through stage 4 chronic kidney disease, or unspecified chronic kidney disease: Secondary | ICD-10-CM | POA: Diagnosis not present

## 2020-02-20 DIAGNOSIS — S31109A Unspecified open wound of abdominal wall, unspecified quadrant without penetration into peritoneal cavity, initial encounter: Secondary | ICD-10-CM | POA: Diagnosis not present

## 2020-02-20 LAB — COMPREHENSIVE METABOLIC PANEL
ALT: 23 U/L (ref 0–44)
AST: 29 U/L (ref 15–41)
Albumin: 3.5 g/dL (ref 3.5–5.0)
Alkaline Phosphatase: 58 U/L (ref 38–126)
Anion gap: 7 (ref 5–15)
BUN: 39 mg/dL — ABNORMAL HIGH (ref 6–20)
CO2: 18 mmol/L — ABNORMAL LOW (ref 22–32)
Calcium: 9 mg/dL (ref 8.9–10.3)
Chloride: 111 mmol/L (ref 98–111)
Creatinine, Ser: 2.32 mg/dL — ABNORMAL HIGH (ref 0.61–1.24)
GFR calc Af Amer: 39 mL/min — ABNORMAL LOW (ref >60)
GFR calc non Af Amer: 34 mL/min — ABNORMAL LOW (ref >60)
Glucose, Bld: 101 mg/dL — ABNORMAL HIGH (ref 70–99)
Potassium: 3.7 mmol/L (ref 3.5–5.1)
Sodium: 136 mmol/L (ref 135–145)
Total Bilirubin: 0.6 mg/dL (ref 0.3–1.2)
Total Protein: 7.4 g/dL (ref 6.5–8.1)

## 2020-02-20 LAB — CBC WITH DIFFERENTIAL/PLATELET
Abs Immature Granulocytes: 0.06 10*3/uL (ref 0.00–0.07)
Basophils Absolute: 0.1 10*3/uL (ref 0.0–0.1)
Basophils Relative: 0 %
Eosinophils Absolute: 0.7 10*3/uL — ABNORMAL HIGH (ref 0.0–0.5)
Eosinophils Relative: 5 %
HCT: 35.6 % — ABNORMAL LOW (ref 39.0–52.0)
Hemoglobin: 11.3 g/dL — ABNORMAL LOW (ref 13.0–17.0)
Immature Granulocytes: 0 %
Lymphocytes Relative: 18 %
Lymphs Abs: 2.4 10*3/uL (ref 0.7–4.0)
MCH: 32.7 pg (ref 26.0–34.0)
MCHC: 31.7 g/dL (ref 30.0–36.0)
MCV: 102.9 fL — ABNORMAL HIGH (ref 80.0–100.0)
Monocytes Absolute: 1.4 10*3/uL — ABNORMAL HIGH (ref 0.1–1.0)
Monocytes Relative: 11 %
Neutro Abs: 8.9 10*3/uL — ABNORMAL HIGH (ref 1.7–7.7)
Neutrophils Relative %: 66 %
Platelets: 189 10*3/uL (ref 150–400)
RBC: 3.46 MIL/uL — ABNORMAL LOW (ref 4.22–5.81)
RDW: 13.5 % (ref 11.5–15.5)
WBC: 13.6 10*3/uL — ABNORMAL HIGH (ref 4.0–10.5)
nRBC: 0 % (ref 0.0–0.2)

## 2020-02-20 LAB — LIPASE, BLOOD: Lipase: 43 U/L (ref 11–51)

## 2020-02-20 MED ORDER — ACETAMINOPHEN 500 MG PO TABS
1000.0000 mg | ORAL_TABLET | Freq: Once | ORAL | Status: AC
  Administered 2020-02-20: 1000 mg via ORAL
  Filled 2020-02-20: qty 2

## 2020-02-20 MED ORDER — SODIUM CHLORIDE (PF) 0.9 % IJ SOLN
INTRAMUSCULAR | Status: AC
  Filled 2020-02-20: qty 50

## 2020-02-20 MED ORDER — SODIUM CHLORIDE 0.9 % IV BOLUS
1000.0000 mL | Freq: Once | INTRAVENOUS | Status: AC
  Administered 2020-02-20: 1000 mL via INTRAVENOUS

## 2020-02-20 MED ORDER — IOHEXOL 300 MG/ML  SOLN
80.0000 mL | Freq: Once | INTRAMUSCULAR | Status: AC | PRN
  Administered 2020-02-20: 80 mL via INTRAVENOUS

## 2020-02-20 NOTE — Discharge Instructions (Addendum)
Your imaging today was reassuring.   Your labs showed some elevation in your kidney function. This appears to be chronic in nature. You need to follow up with your primary care doctor regarding this. If you do not have one, I have referred you to the [facility]   You should avoid ibuprofen.   With any head injury, you should engage in brain rest. This means limiting the amount of physical activity and screen time (phone, computer, TV).   Return to the Emergency Dept for any vomiting, difficulty breathing, worsening abdominal pain, or any other worsening or concerning symptoms.
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
## 2020-02-20 NOTE — ED Notes (Signed)
Patient walked to room without assistance

## 2020-02-20 NOTE — ED Notes (Signed)
He is soundly sleeping.

## 2020-02-28 ENCOUNTER — Ambulatory Visit: Payer: Medicare Other | Admitting: Family Medicine

## 2020-03-01 ENCOUNTER — Emergency Department (HOSPITAL_COMMUNITY): Payer: Medicare Other

## 2020-03-01 ENCOUNTER — Emergency Department (HOSPITAL_COMMUNITY)
Admission: EM | Admit: 2020-03-01 | Discharge: 2020-03-02 | Disposition: A | Discharge status: Home / Self Care | Payer: Medicare Other | Attending: Emergency Medicine | Admitting: Emergency Medicine

## 2020-03-01 ENCOUNTER — Encounter (HOSPITAL_COMMUNITY): Payer: Self-pay | Admitting: Emergency Medicine

## 2020-03-01 DIAGNOSIS — R350 Frequency of micturition: Secondary | ICD-10-CM | POA: Diagnosis not present

## 2020-03-01 DIAGNOSIS — G894 Chronic pain syndrome: Secondary | ICD-10-CM | POA: Diagnosis not present

## 2020-03-01 DIAGNOSIS — R1110 Vomiting, unspecified: Secondary | ICD-10-CM | POA: Diagnosis present

## 2020-03-01 DIAGNOSIS — N200 Calculus of kidney: Secondary | ICD-10-CM | POA: Diagnosis not present

## 2020-03-01 DIAGNOSIS — R319 Hematuria, unspecified: Secondary | ICD-10-CM | POA: Diagnosis not present

## 2020-03-01 DIAGNOSIS — R300 Dysuria: Secondary | ICD-10-CM | POA: Diagnosis not present

## 2020-03-01 DIAGNOSIS — F17200 Nicotine dependence, unspecified, uncomplicated: Secondary | ICD-10-CM | POA: Insufficient documentation

## 2020-03-01 DIAGNOSIS — N3001 Acute cystitis with hematuria: Secondary | ICD-10-CM | POA: Insufficient documentation

## 2020-03-01 DIAGNOSIS — Z7901 Long term (current) use of anticoagulants: Secondary | ICD-10-CM | POA: Insufficient documentation

## 2020-03-01 DIAGNOSIS — R310 Gross hematuria: Secondary | ICD-10-CM

## 2020-03-01 DIAGNOSIS — R1111 Vomiting without nausea: Secondary | ICD-10-CM | POA: Diagnosis not present

## 2020-03-01 DIAGNOSIS — K6389 Other specified diseases of intestine: Secondary | ICD-10-CM | POA: Diagnosis not present

## 2020-03-01 DIAGNOSIS — R309 Painful micturition, unspecified: Secondary | ICD-10-CM | POA: Diagnosis not present

## 2020-03-01 DIAGNOSIS — R112 Nausea with vomiting, unspecified: Secondary | ICD-10-CM

## 2020-03-01 DIAGNOSIS — M795 Residual foreign body in soft tissue: Secondary | ICD-10-CM | POA: Diagnosis not present

## 2020-03-01 LAB — URINALYSIS, ROUTINE W REFLEX MICROSCOPIC: RBC / HPF: >50 RBC/hpf — ABNORMAL HIGH (ref 0–5)

## 2020-03-01 LAB — COMPREHENSIVE METABOLIC PANEL
ALT: 29 U/L (ref 0–44)
AST: 38 U/L (ref 15–41)
Albumin: 4.3 g/dL (ref 3.5–5.0)
Alkaline Phosphatase: 79 U/L (ref 38–126)
Anion gap: 13 (ref 5–15)
BUN: 31 mg/dL — ABNORMAL HIGH (ref 6–20)
CO2: 24 mmol/L (ref 22–32)
Calcium: 9.6 mg/dL (ref 8.9–10.3)
Chloride: 102 mmol/L (ref 98–111)
Creatinine, Ser: 2.4 mg/dL — ABNORMAL HIGH (ref 0.61–1.24)
GFR calc Af Amer: 37 mL/min — ABNORMAL LOW (ref >60)
GFR calc non Af Amer: 32 mL/min — ABNORMAL LOW (ref >60)
Glucose, Bld: 121 mg/dL — ABNORMAL HIGH (ref 70–99)
Potassium: 3.3 mmol/L — ABNORMAL LOW (ref 3.5–5.1)
Sodium: 139 mmol/L (ref 135–145)
Total Bilirubin: 0.4 mg/dL (ref 0.3–1.2)
Total Protein: 8.8 g/dL — ABNORMAL HIGH (ref 6.5–8.1)

## 2020-03-01 LAB — CBC
HCT: 43.6 % (ref 39.0–52.0)
Hemoglobin: 14.1 g/dL (ref 13.0–17.0)
MCH: 32.8 pg (ref 26.0–34.0)
MCHC: 32.3 g/dL (ref 30.0–36.0)
MCV: 101.4 fL — ABNORMAL HIGH (ref 80.0–100.0)
Platelets: 279 10*3/uL (ref 150–400)
RBC: 4.3 MIL/uL (ref 4.22–5.81)
RDW: 13.5 % (ref 11.5–15.5)
WBC: 6.6 10*3/uL (ref 4.0–10.5)
nRBC: 0 % (ref 0.0–0.2)

## 2020-03-01 LAB — LIPASE, BLOOD: Lipase: 29 U/L (ref 11–51)

## 2020-03-01 MED ORDER — MORPHINE SULFATE (PF) 4 MG/ML IV SOLN
4.0000 mg | Freq: Once | INTRAVENOUS | Status: AC
  Administered 2020-03-01: 4 mg via INTRAVENOUS
  Filled 2020-03-01: qty 1

## 2020-03-01 MED ORDER — ONDANSETRON HCL 4 MG/2ML IJ SOLN
4.0000 mg | Freq: Once | INTRAMUSCULAR | Status: AC
  Administered 2020-03-01: 4 mg via INTRAVENOUS
  Filled 2020-03-01: qty 2

## 2020-03-01 MED ORDER — SODIUM CHLORIDE 0.9 % IV SOLN
1.0000 g | Freq: Once | INTRAVENOUS | Status: AC
  Administered 2020-03-01: 1 g via INTRAVENOUS
  Filled 2020-03-01: qty 10

## 2020-03-01 MED ORDER — SODIUM CHLORIDE 0.9 % IV BOLUS
1000.0000 mL | Freq: Once | INTRAVENOUS | Status: AC
  Administered 2020-03-01: 1000 mL via INTRAVENOUS

## 2020-03-01 NOTE — ED Notes (Signed)
Patient saturated in stool as colostomy bag was leaking. New bag provided and applied by patient. Patient placed in gown and assisted into bed.

## 2020-03-01 NOTE — ED Notes (Signed)
Patient transported to CT 

## 2020-03-01 NOTE — ED Triage Notes (Signed)
Per [facility] pt coming in for hematuria, pain with urination, colostomy supply issues and vomiting that started today.  Vitals 120/82, 90HR, 98% on RA.
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
## 2020-03-01 NOTE — ED Notes (Signed)
Attempt IV placement x2 in LAC without success.  Charge RN to come place US guided IV

## 2020-03-01 NOTE — ED Notes (Signed)
Assumed care of patient at this time, nad noted, sr up x2, bed locked and low, call bell w/I reach.  Will continue to monitor.  Ice water has been provided.

## 2020-03-01 NOTE — ED Notes (Signed)
Patient provided with urinal to drain ostomy bag.

## 2020-03-02 MED ORDER — ONDANSETRON 4 MG PO TBDP: 4.0000 mg | ORAL_TABLET | Freq: Three times a day (TID) | ORAL | 0 refills | Status: DC | PRN

## 2020-03-02 MED ORDER — CEPHALEXIN 500 MG PO CAPS: 500.0000 mg | ORAL_CAPSULE | Freq: Four times a day (QID) | ORAL | 0 refills | Status: AC

## 2020-03-02 NOTE — ED Notes (Signed)
Paper scrubs has been provided to the patient.

## 2020-03-02 NOTE — ED Notes (Signed)
Cups of ice water has been provided and a sandwich.

## 2020-03-02 NOTE — ED Notes (Signed)
Ostomy supplies has been provided to the patient.

## 2020-03-02 NOTE — Discharge Instructions (Signed)
Thank you for allowing me to care for you today in the Emergency Department.   You were seen today for blood in your urine and vomiting.  Your urine was concerning for an infection.  You were treated with a dose of Rocephin in the ER.  You should take 1 tablet of Keflex every 6 hours for the next 5 days.  You should also call and schedule a follow-up appointment with [facility] since you are having blood in your urine and you on a blood thinner.  You can also follow-up to have your stent removed from your last kidney stone.  Let 1 tablet of Zofran dissolve in your tongue every 8 hours as needed for nausea or vomiting.  You can take 650 mg of Tylenol once every 6 hours for pain control.  Avoid taking NSAIDs because of your kidney function.  Return to the emergency department if you begin passing large clots of blood in your urine, if you develop shortness of breath with worsening bleeding, if you pass out, develop high fevers, uncontrollable vomiting, or other new, concerning symptoms.

## 2020-03-03 ENCOUNTER — Encounter: Payer: Medicare Other | Admitting: Vascular Surgery

## 2020-03-03 LAB — URINE CULTURE: Culture: <10000 — AB

## 2020-03-09 ENCOUNTER — Encounter (HOSPITAL_COMMUNITY): Payer: Self-pay | Admitting: Emergency Medicine

## 2020-03-09 ENCOUNTER — Other Ambulatory Visit: Payer: Self-pay

## 2020-03-09 ENCOUNTER — Emergency Department (HOSPITAL_COMMUNITY)
Admission: EM | Admit: 2020-03-09 | Discharge: 2020-03-10 | Disposition: A | Discharge status: Home / Self Care | Payer: Medicare Other | Attending: Emergency Medicine | Admitting: Emergency Medicine

## 2020-03-09 DIAGNOSIS — Z433 Encounter for attention to colostomy: Secondary | ICD-10-CM | POA: Diagnosis not present

## 2020-03-09 DIAGNOSIS — Z789 Other specified health status: Secondary | ICD-10-CM

## 2020-03-09 DIAGNOSIS — Z87891 Personal history of nicotine dependence: Secondary | ICD-10-CM | POA: Diagnosis not present

## 2020-03-09 DIAGNOSIS — Z9119 Patient's noncompliance with other medical treatment and regimen: Secondary | ICD-10-CM | POA: Diagnosis not present

## 2020-03-09 DIAGNOSIS — L03115 Cellulitis of right lower limb: Secondary | ICD-10-CM | POA: Diagnosis not present

## 2020-03-09 DIAGNOSIS — M79604 Pain in right leg: Secondary | ICD-10-CM | POA: Insufficient documentation

## 2020-03-09 NOTE — ED Triage Notes (Signed)
Pt c/o colostomy bag leaking that just began today. Also c/o right leg pain with swelling. Reports that hes taking the meds as suggested but needs help with pain.

## 2020-03-10 MED ORDER — ACETAMINOPHEN 500 MG PO TABS
1000.0000 mg | ORAL_TABLET | Freq: Once | ORAL | Status: AC
  Administered 2020-03-10: 1000 mg via ORAL
  Filled 2020-03-10: qty 2

## 2020-03-10 MED ORDER — ACETAMINOPHEN 500 MG PO TABS: 1000.0000 mg | ORAL_TABLET | Freq: Three times a day (TID) | ORAL | 0 refills | Status: DC | PRN

## 2020-03-10 NOTE — Discharge Instructions (Addendum)
Continue taking home medications as prescribed. Use Tylenol as needed for pain. Call your primary care doctor as needed for refills of your medication and ostomy supplies. Return to the emergency room with any new, worsening, concerning symptoms.
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
## 2020-03-20 ENCOUNTER — Encounter (HOSPITAL_COMMUNITY): Payer: Self-pay

## 2020-03-20 ENCOUNTER — Other Ambulatory Visit: Payer: Self-pay

## 2020-03-20 ENCOUNTER — Emergency Department (HOSPITAL_COMMUNITY): Payer: Medicare Other

## 2020-03-20 ENCOUNTER — Emergency Department (HOSPITAL_COMMUNITY)
Admission: EM | Admit: 2020-03-20 | Discharge: 2020-03-20 | Disposition: A | Discharge status: Home / Self Care | Payer: Medicare Other | Attending: Emergency Medicine | Admitting: Emergency Medicine

## 2020-03-20 DIAGNOSIS — K7689 Other specified diseases of liver: Secondary | ICD-10-CM | POA: Diagnosis not present

## 2020-03-20 DIAGNOSIS — Z79899 Other long term (current) drug therapy: Secondary | ICD-10-CM | POA: Diagnosis not present

## 2020-03-20 DIAGNOSIS — R2241 Localized swelling, mass and lump, right lower limb: Secondary | ICD-10-CM | POA: Insufficient documentation

## 2020-03-20 DIAGNOSIS — M7989 Other specified soft tissue disorders: Secondary | ICD-10-CM | POA: Diagnosis not present

## 2020-03-20 DIAGNOSIS — S3210XA Unspecified fracture of sacrum, initial encounter for closed fracture: Secondary | ICD-10-CM | POA: Diagnosis not present

## 2020-03-20 DIAGNOSIS — Z7901 Long term (current) use of anticoagulants: Secondary | ICD-10-CM | POA: Insufficient documentation

## 2020-03-20 DIAGNOSIS — Z86718 Personal history of other venous thrombosis and embolism: Secondary | ICD-10-CM | POA: Diagnosis not present

## 2020-03-20 DIAGNOSIS — Z87891 Personal history of nicotine dependence: Secondary | ICD-10-CM | POA: Insufficient documentation

## 2020-03-20 DIAGNOSIS — N390 Urinary tract infection, site not specified: Secondary | ICD-10-CM

## 2020-03-20 DIAGNOSIS — N2889 Other specified disorders of kidney and ureter: Secondary | ICD-10-CM | POA: Diagnosis not present

## 2020-03-20 DIAGNOSIS — N132 Hydronephrosis with renal and ureteral calculous obstruction: Secondary | ICD-10-CM | POA: Diagnosis not present

## 2020-03-20 DIAGNOSIS — R319 Hematuria, unspecified: Secondary | ICD-10-CM | POA: Diagnosis not present

## 2020-03-20 LAB — CBC WITH DIFFERENTIAL/PLATELET
Abs Immature Granulocytes: 0.05 10*3/uL (ref 0.00–0.07)
Basophils Absolute: 0.1 10*3/uL (ref 0.0–0.1)
Basophils Relative: 0 %
Eosinophils Absolute: 0.4 10*3/uL (ref 0.0–0.5)
Eosinophils Relative: 3 %
HCT: 42.1 % (ref 39.0–52.0)
Hemoglobin: 13.8 g/dL (ref 13.0–17.0)
Immature Granulocytes: 0 %
Lymphocytes Relative: 15 %
Lymphs Abs: 2.1 10*3/uL (ref 0.7–4.0)
MCH: 31.7 pg (ref 26.0–34.0)
MCHC: 32.8 g/dL (ref 30.0–36.0)
MCV: 96.8 fL (ref 80.0–100.0)
Monocytes Absolute: 1.4 10*3/uL — ABNORMAL HIGH (ref 0.1–1.0)
Monocytes Relative: 10 %
Neutro Abs: 9.9 10*3/uL — ABNORMAL HIGH (ref 1.7–7.7)
Neutrophils Relative %: 72 %
Platelets: 319 10*3/uL (ref 150–400)
RBC: 4.35 MIL/uL (ref 4.22–5.81)
RDW: 12.7 % (ref 11.5–15.5)
WBC: 13.9 10*3/uL — ABNORMAL HIGH (ref 4.0–10.5)
nRBC: 0 % (ref 0.0–0.2)

## 2020-03-20 LAB — URINALYSIS, ROUTINE W REFLEX MICROSCOPIC
Bacteria, UA: NONE SEEN
Bilirubin Urine: NEGATIVE
Glucose, UA: NEGATIVE mg/dL
Ketones, ur: NEGATIVE mg/dL
Nitrite: NEGATIVE
Protein, ur: 100 mg/dL — AB
RBC / HPF: >50 RBC/hpf — ABNORMAL HIGH (ref 0–5)
Specific Gravity, Urine: 1.021 (ref 1.005–1.030)
WBC, UA: >50 WBC/hpf — ABNORMAL HIGH (ref 0–5)
pH: 6 (ref 5.0–8.0)

## 2020-03-20 LAB — BASIC METABOLIC PANEL
Anion gap: 14 (ref 5–15)
BUN: 36 mg/dL — ABNORMAL HIGH (ref 6–20)
CO2: 24 mmol/L (ref 22–32)
Calcium: 9.9 mg/dL (ref 8.9–10.3)
Chloride: 100 mmol/L (ref 98–111)
Creatinine, Ser: 2.22 mg/dL — ABNORMAL HIGH (ref 0.61–1.24)
GFR calc Af Amer: 41 mL/min — ABNORMAL LOW (ref >60)
GFR calc non Af Amer: 35 mL/min — ABNORMAL LOW (ref >60)
Glucose, Bld: 95 mg/dL (ref 70–99)
Potassium: 3.4 mmol/L — ABNORMAL LOW (ref 3.5–5.1)
Sodium: 138 mmol/L (ref 135–145)

## 2020-03-20 MED ORDER — SODIUM CHLORIDE 0.9 % IV SOLN
1.0000 g | Freq: Once | INTRAVENOUS | Status: AC
  Administered 2020-03-20: 1 g via INTRAVENOUS
  Filled 2020-03-20: qty 10

## 2020-03-20 MED ORDER — SULFAMETHOXAZOLE-TRIMETHOPRIM 800-160 MG PO TABS: 1.0000 | ORAL_TABLET | Freq: Two times a day (BID) | ORAL | 0 refills | Status: AC

## 2020-03-20 NOTE — Discharge Instructions (Addendum)
Please call Dr. Shannan Harper office and schedule an appointment to discuss getting your stent removed - they can also further evaluate you in terms of the blood in your urine Pick up antibiotics and take as prescribed Continue taking your other medications as prescribed Follow up with your PCP regarding your chronic leg swelling Return to the ED for any worsening symptoms

## 2020-03-20 NOTE — ED Triage Notes (Signed)
Patient c/o hematuria since yesterday. Patient also reports swelling of his lower extremities x 2 days.
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
## 2020-03-20 NOTE — ED Notes (Signed)
ED Provider at bedside. 

## 2020-03-20 NOTE — ED Notes (Signed)
Pt verbalized dc instructions and follow up care. Alert and ambulatory. No iv. 

## 2020-03-21 LAB — URINE CULTURE: Culture: <10000 — AB

## 2020-03-29 ENCOUNTER — Ambulatory Visit: Payer: Medicare Other | Attending: Physician Assistant | Admitting: Physician Assistant

## 2020-03-31 ENCOUNTER — Other Ambulatory Visit: Payer: Self-pay

## 2020-03-31 ENCOUNTER — Emergency Department (HOSPITAL_COMMUNITY)
Admission: EM | Admit: 2020-03-31 | Discharge: 2020-03-31 | Disposition: A | Discharge status: Home / Self Care | Payer: Medicare Other | Attending: Emergency Medicine | Admitting: Emergency Medicine

## 2020-03-31 ENCOUNTER — Encounter (HOSPITAL_COMMUNITY): Payer: Self-pay | Admitting: *Deleted

## 2020-03-31 DIAGNOSIS — Z5321 Procedure and treatment not carried out due to patient leaving prior to being seen by health care provider: Secondary | ICD-10-CM | POA: Insufficient documentation

## 2020-03-31 DIAGNOSIS — M79672 Pain in left foot: Secondary | ICD-10-CM | POA: Insufficient documentation

## 2020-03-31 DIAGNOSIS — R200 Anesthesia of skin: Secondary | ICD-10-CM | POA: Insufficient documentation

## 2020-03-31 NOTE — ED Notes (Signed)
No response for vital signs x1

## 2020-03-31 NOTE — ED Notes (Signed)
No response for vitals x3  

## 2020-03-31 NOTE — ED Triage Notes (Signed)
To ED for eval of left foot pain 'for a few weeks'. No injury. States his left small toe is numb. Hx of same per pt. Ambulatory wnl.

## 2020-04-04 ENCOUNTER — Encounter (HOSPITAL_COMMUNITY): Payer: Self-pay

## 2020-04-04 ENCOUNTER — Emergency Department (HOSPITAL_COMMUNITY)
Admission: EM | Admit: 2020-04-04 | Discharge: 2020-04-04 | Disposition: A | Discharge status: Home / Self Care | Payer: Medicare Other | Attending: Emergency Medicine | Admitting: Emergency Medicine

## 2020-04-04 DIAGNOSIS — R5381 Other malaise: Secondary | ICD-10-CM | POA: Diagnosis not present

## 2020-04-04 DIAGNOSIS — R52 Pain, unspecified: Secondary | ICD-10-CM | POA: Diagnosis not present

## 2020-04-04 DIAGNOSIS — Z87891 Personal history of nicotine dependence: Secondary | ICD-10-CM | POA: Insufficient documentation

## 2020-04-04 DIAGNOSIS — Z79899 Other long term (current) drug therapy: Secondary | ICD-10-CM | POA: Diagnosis not present

## 2020-04-04 DIAGNOSIS — Z433 Encounter for attention to colostomy: Secondary | ICD-10-CM | POA: Diagnosis not present

## 2020-04-04 NOTE — Discharge Instructions (Addendum)
At this time there does not appear to be the presence of an emergent medical condition, however there is always the potential for conditions to change. Please read and follow the below instructions.  Please return to the Emergency Department immediately for any new or worsening symptoms. Please be sure to follow up with your Primary Care Provider within one week regarding your visit today; please call their office to schedule an appointment even if you are feeling better for a follow-up visit.  Follow-up through your primary care provider for future colostomy supplies and care.  You may call the specialist at [facility] health community health and wellness to establish a primary care doctor. Try to keep the skin around your ostomy site clean and dry to avoid irritation.  You may apply over-the-counter barrier cream to help in the macerated skin with healing.  If you develop signs of infection including fever, increasing pain, drainage, swelling return to the emergency department.  Get help right away if: Your stool is bloody. You have nausea or you vomit. You have trouble breathing. You have a fever or chills You have any new/concerning or worsening of symptoms  Please read the additional information packets attached to your discharge summary.  Do not take your medicine if  develop an itchy rash, swelling in your mouth or lips, or difficulty breathing; call 911 and seek immediate emergency medical attention if this occurs.  You may review your lab tests and imaging results in their entirety on your MyChart account.  Please discuss all results of fully with your primary care provider and other specialist at your follow-up visit.  Note: Portions of this text may have been transcribed using voice recognition software. Every effort was made to ensure accuracy; however, inadvertent computerized transcription errors may still be present.

## 2020-04-04 NOTE — ED Triage Notes (Signed)
Pt states that he has run out of colostomy bags. Pt states that he has been having pain by colostomy site.

## 2020-04-04 NOTE — ED Notes (Signed)
Patient given colostomy supplies by this RN
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
## 2020-04-07 ENCOUNTER — Encounter (HOSPITAL_COMMUNITY): Payer: Self-pay | Admitting: Emergency Medicine

## 2020-04-07 ENCOUNTER — Other Ambulatory Visit: Payer: Self-pay

## 2020-04-07 ENCOUNTER — Emergency Department (HOSPITAL_COMMUNITY)
Admission: EM | Admit: 2020-04-07 | Discharge: 2020-04-07 | Disposition: A | Discharge status: Home / Self Care | Payer: Medicare Other | Attending: Emergency Medicine | Admitting: Emergency Medicine

## 2020-04-07 DIAGNOSIS — I82511 Chronic embolism and thrombosis of right femoral vein: Secondary | ICD-10-CM

## 2020-04-07 DIAGNOSIS — R140 Abdominal distension (gaseous): Secondary | ICD-10-CM | POA: Diagnosis not present

## 2020-04-07 DIAGNOSIS — M79604 Pain in right leg: Secondary | ICD-10-CM | POA: Diagnosis present

## 2020-04-07 DIAGNOSIS — Z789 Other specified health status: Secondary | ICD-10-CM

## 2020-04-07 DIAGNOSIS — R52 Pain, unspecified: Secondary | ICD-10-CM | POA: Diagnosis not present

## 2020-04-07 DIAGNOSIS — Z87891 Personal history of nicotine dependence: Secondary | ICD-10-CM | POA: Diagnosis not present

## 2020-04-07 DIAGNOSIS — Z433 Encounter for attention to colostomy: Secondary | ICD-10-CM | POA: Diagnosis not present

## 2020-04-07 DIAGNOSIS — I825Y1 Chronic embolism and thrombosis of unspecified deep veins of right proximal lower extremity: Secondary | ICD-10-CM | POA: Diagnosis not present

## 2020-04-07 DIAGNOSIS — I82501 Chronic embolism and thrombosis of unspecified deep veins of right lower extremity: Secondary | ICD-10-CM

## 2020-04-07 DIAGNOSIS — Z79899 Other long term (current) drug therapy: Secondary | ICD-10-CM | POA: Insufficient documentation

## 2020-04-07 DIAGNOSIS — R609 Edema, unspecified: Secondary | ICD-10-CM | POA: Diagnosis not present

## 2020-04-07 LAB — CBC WITH DIFFERENTIAL/PLATELET
Abs Immature Granulocytes: 0.04 10*3/uL (ref 0.00–0.07)
Basophils Absolute: 0.1 10*3/uL (ref 0.0–0.1)
Basophils Relative: 1 %
Eosinophils Absolute: 0.7 10*3/uL — ABNORMAL HIGH (ref 0.0–0.5)
Eosinophils Relative: 6 %
HCT: 37.2 % — ABNORMAL LOW (ref 39.0–52.0)
Hemoglobin: 11.8 g/dL — ABNORMAL LOW (ref 13.0–17.0)
Immature Granulocytes: 0 %
Lymphocytes Relative: 17 %
Lymphs Abs: 2.1 10*3/uL (ref 0.7–4.0)
MCH: 30.4 pg (ref 26.0–34.0)
MCHC: 31.7 g/dL (ref 30.0–36.0)
MCV: 95.9 fL (ref 80.0–100.0)
Monocytes Absolute: 1 10*3/uL (ref 0.1–1.0)
Monocytes Relative: 8 %
Neutro Abs: 8.3 10*3/uL — ABNORMAL HIGH (ref 1.7–7.7)
Neutrophils Relative %: 68 %
Platelets: 231 10*3/uL (ref 150–400)
RBC: 3.88 MIL/uL — ABNORMAL LOW (ref 4.22–5.81)
RDW: 13.6 % (ref 11.5–15.5)
WBC: 12.1 10*3/uL — ABNORMAL HIGH (ref 4.0–10.5)
nRBC: 0 % (ref 0.0–0.2)

## 2020-04-07 LAB — COMPREHENSIVE METABOLIC PANEL
ALT: 29 U/L (ref 0–44)
AST: 39 U/L (ref 15–41)
Albumin: 3.2 g/dL — ABNORMAL LOW (ref 3.5–5.0)
Alkaline Phosphatase: 74 U/L (ref 38–126)
Anion gap: 10 (ref 5–15)
BUN: 20 mg/dL (ref 6–20)
CO2: 17 mmol/L — ABNORMAL LOW (ref 22–32)
Calcium: 8.6 mg/dL — ABNORMAL LOW (ref 8.9–10.3)
Chloride: 109 mmol/L (ref 98–111)
Creatinine, Ser: 1.96 mg/dL — ABNORMAL HIGH (ref 0.61–1.24)
GFR calc Af Amer: 48 mL/min — ABNORMAL LOW (ref >60)
GFR calc non Af Amer: 41 mL/min — ABNORMAL LOW (ref >60)
Glucose, Bld: 86 mg/dL (ref 70–99)
Potassium: 3.9 mmol/L (ref 3.5–5.1)
Sodium: 136 mmol/L (ref 135–145)
Total Bilirubin: 0.4 mg/dL (ref 0.3–1.2)
Total Protein: 6.7 g/dL (ref 6.5–8.1)

## 2020-04-07 MED ORDER — RIVAROXABAN 15 MG PO TABS: 15.0000 mg | ORAL_TABLET | Freq: Every day | ORAL | 0 refills | Status: DC

## 2020-04-07 MED ORDER — RIVAROXABAN 15 MG PO TABS
15.0000 mg | ORAL_TABLET | Freq: Once | ORAL | Status: AC
  Administered 2020-04-07: 15 mg via ORAL
  Filled 2020-04-07 (×2): qty 1

## 2020-04-07 NOTE — ED Notes (Signed)
Colostomy bag and waifer ordered and changed. Pt now has extra bag and waifer

## 2020-04-07 NOTE — ED Notes (Signed)
Patient verbalizes understanding of discharge instructions. Opportunity for questioning and answers were provided. Armband removed by staff, pt discharged from ED ambulatory.   

## 2020-04-07 NOTE — ED Notes (Signed)
Pt needs colostomy bag changed. Bags ordered and with pt but pt needs waifers to be able to put new bag on. Pt cleaned up in the lobby

## 2020-04-07 NOTE — ED Triage Notes (Signed)
Patient from outside, has run out of colostomy bags and having right leg pain and swelling.  Patient thinks he has a blood clot in right leg.
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
## 2020-04-07 NOTE — Discharge Instructions (Signed)
Please make sure to take your Xarelto daily to decrease risk of complication from your chronic deep venous thrombosis in your right leg.  Please reach out to your care coordinator for further managements of your ostomy.  Keep your feet dry to decrease dry skin which can irritate your foot.

## 2020-04-11 ENCOUNTER — Encounter (HOSPITAL_COMMUNITY): Payer: Self-pay

## 2020-04-11 ENCOUNTER — Other Ambulatory Visit: Payer: Self-pay

## 2020-04-11 ENCOUNTER — Other Ambulatory Visit: Payer: Self-pay | Admitting: Physician Assistant

## 2020-04-11 ENCOUNTER — Emergency Department (HOSPITAL_BASED_OUTPATIENT_CLINIC_OR_DEPARTMENT_OTHER): Payer: Medicare Other

## 2020-04-11 ENCOUNTER — Emergency Department (HOSPITAL_COMMUNITY)
Admission: EM | Admit: 2020-04-11 | Discharge: 2020-04-11 | Disposition: A | Discharge status: Home / Self Care | Payer: Medicare Other | Attending: Emergency Medicine | Admitting: Emergency Medicine

## 2020-04-11 DIAGNOSIS — Z7901 Long term (current) use of anticoagulants: Secondary | ICD-10-CM | POA: Insufficient documentation

## 2020-04-11 DIAGNOSIS — Z87891 Personal history of nicotine dependence: Secondary | ICD-10-CM | POA: Diagnosis not present

## 2020-04-11 DIAGNOSIS — M79604 Pain in right leg: Secondary | ICD-10-CM | POA: Diagnosis not present

## 2020-04-11 DIAGNOSIS — R202 Paresthesia of skin: Secondary | ICD-10-CM | POA: Diagnosis not present

## 2020-04-11 DIAGNOSIS — Z86718 Personal history of other venous thrombosis and embolism: Secondary | ICD-10-CM

## 2020-04-11 DIAGNOSIS — I82511 Chronic embolism and thrombosis of right femoral vein: Secondary | ICD-10-CM | POA: Diagnosis not present

## 2020-04-11 DIAGNOSIS — M79609 Pain in unspecified limb: Secondary | ICD-10-CM

## 2020-04-11 DIAGNOSIS — M7989 Other specified soft tissue disorders: Secondary | ICD-10-CM | POA: Diagnosis not present

## 2020-04-11 DIAGNOSIS — I872 Venous insufficiency (chronic) (peripheral): Secondary | ICD-10-CM

## 2020-04-11 MED ORDER — APIXABAN (ELIQUIS) EDUCATION KIT FOR DVT/PE PATIENTS
PACK | Freq: Once | Status: DC
  Filled 2020-04-11: qty 1

## 2020-04-11 MED ORDER — APIXABAN 5 MG PO TABS
10.0000 mg | ORAL_TABLET | Freq: Two times a day (BID) | ORAL | Status: DC
  Filled 2020-04-11: qty 2

## 2020-04-11 MED ORDER — ACETAMINOPHEN 500 MG PO TABS
1000.0000 mg | ORAL_TABLET | Freq: Once | ORAL | Status: AC
  Administered 2020-04-11: 1000 mg via ORAL
  Filled 2020-04-11: qty 2

## 2020-04-11 MED ORDER — APIXABAN (ELIQUIS) VTE STARTER PACK (10MG AND 5MG): ORAL_TABLET | ORAL | 0 refills | Status: DC

## 2020-04-11 MED ORDER — FUROSEMIDE 20 MG PO TABS: 20.0000 mg | ORAL_TABLET | Freq: Every day | ORAL | 0 refills | Status: DC

## 2020-04-11 MED ORDER — IBUPROFEN 200 MG PO TABS
600.0000 mg | ORAL_TABLET | Freq: Once | ORAL | Status: AC
  Administered 2020-04-11: 600 mg via ORAL
  Filled 2020-04-11: qty 3

## 2020-04-11 MED FILL — FUROSEMIDE 20 MG TABS: 20 | 3 days supply | Qty: 3 | Fill #0

## 2020-04-11 MED FILL — ELIQUIS 5 MG TABLET: 5 | 30 days supply | Qty: 74 | Fill #0

## 2020-04-11 NOTE — ED Triage Notes (Signed)
Pt arrived via walk in, c/o right leg swelling. Seen recently, dx with DVT. Told to follow up with PCP, states he was unable to get appt for about 5 days and states he couldn't wait.

## 2020-04-11 NOTE — Discharge Instructions (Signed)
We started you on a blood thinner called Eliquis, which you need to take as directed for the next few months.  Your primary care doctor can guide this therapy.  Follow the directions on the package carefully.  Your daily dosing drops down after 7 days.  Your medications were sent to the [facility], 1 block away from the hospital, on [address]  Go there after you are discharged from the ER.    We were able to give you a coupon for a free 30 day supply.  You and your doctor need to check with your insurance company about getting an extension beyond that.    I also prescribed you 3 days of lasix, a water pill, to help you urinate some fluid out of your legs.  Try to keep your right leg lifted up when you are resting - prop it up on the couch or a chair.

## 2020-04-11 NOTE — Progress Notes (Signed)
TOC CM spoke to pt and bedside. Pt with multiple ED admissions. Pt states he is homeless. Contacted [facility]. Waiting call back. Pt states he has applied for Section 9 housing. States he uses his disability money to get a room but it only last him 2 weeks at a hotel. He does receive food stamps. Gave permission to speak to friend, Emelia Loron. States his colostomy bags goes to her home. He is expecting a shipment today. Colostomy bags are supplied by [facility]. Pt states he may need transportation. Attempted to contact shelters in [place]. Pt prefers to stay in [place]. States he has been to [facility] and [facility]. Contacted [facility] and they do not have the Eliquis starter packs. They will check with [facility] pharmacy. Provided information to use the 30 day free trial card with Eliquis. Dametrius states he contacted [facility] and he has Lasix and Xarelto RX pending. Contacted ED provider to see if pt can resume Xarelto. He will have Lasix rx switched to their pharmacy. CM will pick up meds so pt can leave with them in hand due to bad weather and no transportation. Isidoro Donning RN CCM, [facility] ED TOC CM [phone]
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
## 2020-04-16 ENCOUNTER — Other Ambulatory Visit: Payer: Self-pay

## 2020-04-16 ENCOUNTER — Emergency Department (HOSPITAL_COMMUNITY)
Admission: EM | Admit: 2020-04-16 | Discharge: 2020-04-16 | Disposition: A | Discharge status: Home / Self Care | Payer: Medicare Other | Attending: Emergency Medicine | Admitting: Emergency Medicine

## 2020-04-16 ENCOUNTER — Encounter (HOSPITAL_COMMUNITY): Payer: Self-pay | Admitting: Emergency Medicine

## 2020-04-16 DIAGNOSIS — Z79899 Other long term (current) drug therapy: Secondary | ICD-10-CM | POA: Diagnosis not present

## 2020-04-16 DIAGNOSIS — Z87891 Personal history of nicotine dependence: Secondary | ICD-10-CM | POA: Diagnosis not present

## 2020-04-16 DIAGNOSIS — K9403 Colostomy malfunction: Secondary | ICD-10-CM | POA: Insufficient documentation

## 2020-04-16 DIAGNOSIS — Z7901 Long term (current) use of anticoagulants: Secondary | ICD-10-CM | POA: Diagnosis not present

## 2020-04-16 DIAGNOSIS — K9400 Colostomy complication, unspecified: Secondary | ICD-10-CM
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
## 2020-04-16 NOTE — Discharge Instructions (Signed)
Return if any problems.

## 2020-04-16 NOTE — ED Triage Notes (Signed)
Needs colostomy supplies

## 2020-04-16 NOTE — ED Notes (Signed)
Pt. Discharged no concerns at this time.

## 2020-04-18 ENCOUNTER — Emergency Department (HOSPITAL_COMMUNITY)
Admission: EM | Admit: 2020-04-18 | Discharge: 2020-04-18 | Disposition: A | Discharge status: Home / Self Care | Payer: Medicare Other | Attending: Emergency Medicine | Admitting: Emergency Medicine

## 2020-04-18 ENCOUNTER — Other Ambulatory Visit: Payer: Self-pay

## 2020-04-18 ENCOUNTER — Encounter (HOSPITAL_COMMUNITY): Payer: Self-pay | Admitting: Emergency Medicine

## 2020-04-18 ENCOUNTER — Emergency Department (HOSPITAL_COMMUNITY)
Admission: EM | Admit: 2020-04-18 | Discharge: 2020-04-18 | Disposition: A | Discharge status: Home / Self Care | Payer: Medicare Other | Source: Home / Self Care | Attending: Emergency Medicine | Admitting: Emergency Medicine

## 2020-04-18 DIAGNOSIS — Z933 Colostomy status: Secondary | ICD-10-CM | POA: Insufficient documentation

## 2020-04-18 DIAGNOSIS — Z87891 Personal history of nicotine dependence: Secondary | ICD-10-CM | POA: Insufficient documentation

## 2020-04-18 DIAGNOSIS — Z5321 Procedure and treatment not carried out due to patient leaving prior to being seen by health care provider: Secondary | ICD-10-CM | POA: Diagnosis not present

## 2020-04-18 DIAGNOSIS — Z433 Encounter for attention to colostomy: Secondary | ICD-10-CM | POA: Diagnosis not present

## 2020-04-18 DIAGNOSIS — Z7189 Other specified counseling: Secondary | ICD-10-CM

## 2020-04-18 DIAGNOSIS — R5381 Other malaise: Secondary | ICD-10-CM | POA: Diagnosis not present

## 2020-04-18 DIAGNOSIS — R69 Illness, unspecified: Secondary | ICD-10-CM | POA: Diagnosis not present

## 2020-04-18 NOTE — ED Notes (Signed)
Pt refused vitals 
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
## 2020-04-18 NOTE — ED Notes (Signed)
Pt name called for vitals, no response 

## 2020-04-18 NOTE — ED Notes (Signed)
Patient refused vitals.

## 2020-04-18 NOTE — ED Triage Notes (Signed)
Pt states needs new colostomy bag and has ostomy skin irritation.  Pt was at [facility] and left to come here.  Pt states he has been calling the ostomy care providers.

## 2020-04-18 NOTE — ED Triage Notes (Signed)
States ostomy site is bleeding too.

## 2020-04-18 NOTE — ED Triage Notes (Signed)
Patient wants colostomy bags. Patient states he does not have any at home.

## 2020-04-18 NOTE — ED Notes (Signed)
Ostomy supplies have been given to pt per MD order.

## 2020-04-23 ENCOUNTER — Telehealth: Payer: Self-pay

## 2020-04-23 NOTE — Telephone Encounter (Signed)
Patient calling in 2-3 times ( not realizing he is getting the same person) stating he cannot stand the pain and he needs just a bag for his stoma until he can call tomorrow. He states he has been trying to call the supplier and unable to a hold of them. Looking at his history, he has been to the ED multiple times for osomy issues and supplies, but not following up with a PCP ( [facility]). Patient has Medicare- needs referal to wound ostomy outpatient. Patient needs to establish and stay in tough with community health.   I discussed with him, he states the stoma is killing him, but he has his children for the weekend.  He states his case worker has tried calling this company as well.   Unable to get  A hold of [employer] at this time .

## 2020-04-23 NOTE — Telephone Encounter (Signed)
Called [employer] , they will be able to provide a pouch and wafer for this patient. Called him back and he will have someone go pick it up

## 2020-04-24 ENCOUNTER — Telehealth: Payer: Self-pay | Admitting: Family Medicine

## 2020-04-25 ENCOUNTER — Inpatient Hospital Stay: Payer: Medicare Other | Admitting: Family Medicine

## 2020-04-25 NOTE — Telephone Encounter (Signed)
Patient no showed his appointment today.

## 2020-05-01 ENCOUNTER — Emergency Department (HOSPITAL_COMMUNITY)

## 2020-05-01 ENCOUNTER — Encounter (HOSPITAL_COMMUNITY): Payer: Self-pay

## 2020-05-01 ENCOUNTER — Emergency Department (HOSPITAL_COMMUNITY)
Admission: EM | Admit: 2020-05-01 | Discharge: 2020-05-01 | Disposition: A | Discharge status: Home / Self Care | Attending: Emergency Medicine | Admitting: Emergency Medicine

## 2020-05-01 DIAGNOSIS — Y9289 Other specified places as the place of occurrence of the external cause: Secondary | ICD-10-CM | POA: Diagnosis not present

## 2020-05-01 DIAGNOSIS — Y9389 Activity, other specified: Secondary | ICD-10-CM | POA: Insufficient documentation

## 2020-05-01 DIAGNOSIS — R369 Urethral discharge, unspecified: Secondary | ICD-10-CM

## 2020-05-01 DIAGNOSIS — S42002A Fracture of unspecified part of left clavicle, initial encounter for closed fracture: Secondary | ICD-10-CM | POA: Diagnosis not present

## 2020-05-01 DIAGNOSIS — S42032A Displaced fracture of lateral end of left clavicle, initial encounter for closed fracture: Secondary | ICD-10-CM | POA: Diagnosis not present

## 2020-05-01 DIAGNOSIS — Z87891 Personal history of nicotine dependence: Secondary | ICD-10-CM | POA: Insufficient documentation

## 2020-05-01 DIAGNOSIS — S4292XA Fracture of left shoulder girdle, part unspecified, initial encounter for closed fracture: Secondary | ICD-10-CM | POA: Diagnosis present

## 2020-05-01 DIAGNOSIS — R519 Headache, unspecified: Secondary | ICD-10-CM | POA: Diagnosis not present

## 2020-05-01 LAB — URINALYSIS, ROUTINE W REFLEX MICROSCOPIC
Bacteria, UA: NONE SEEN
RBC / HPF: >50 RBC/hpf — ABNORMAL HIGH (ref 0–5)

## 2020-05-01 MED ORDER — FENTANYL CITRATE (PF) 100 MCG/2ML IJ SOLN
100.0000 ug | Freq: Once | INTRAMUSCULAR | Status: AC
  Administered 2020-05-01: 100 ug via INTRAMUSCULAR
  Filled 2020-05-01: qty 2

## 2020-05-01 MED ORDER — LIDOCAINE HCL 1 % IJ SOLN
INTRAMUSCULAR | Status: AC
  Administered 2020-05-01: 1 mL
  Filled 2020-05-01: qty 20

## 2020-05-01 MED ORDER — CEFTRIAXONE SODIUM 1 G IJ SOLR
500.0000 mg | Freq: Once | INTRAMUSCULAR | Status: AC
  Administered 2020-05-01: 500 mg via INTRAMUSCULAR
  Filled 2020-05-01: qty 10

## 2020-05-01 MED ORDER — DOXYCYCLINE HYCLATE 100 MG PO CAPS: 100.0000 mg | ORAL_CAPSULE | Freq: Two times a day (BID) | ORAL | 0 refills | Status: AC

## 2020-05-01 NOTE — ED Notes (Signed)
Patient transported to X-ray 

## 2020-05-01 NOTE — Progress Notes (Signed)
Orthopedic Tech Progress Note Patient Details:  Raymond Bowen 1975-09-24 [id number]  Ortho Devices Type of Ortho Device: Shoulder immobilizer Ortho Device/Splint Location: left Ortho Device/Splint Interventions: Application   Post Interventions Patient Tolerated: Well Instructions Provided: Care of device   [name] 05/01/2020, 3:18 PM

## 2020-05-01 NOTE — ED Provider Notes (Signed)
Accepted handoff at shift change from S. Patel PA-C. Please see prior provider note for more detail.   Briefly: Patient is 44 y.o. please see prior provider note for more detail.  Briefly patient came in for shoulder pain was found to have a fractured clavicle. Incidentally noted to have some hematuria and informed PA of this.  He has no other urinary symptoms recently had ureteral stent placed for kidney stone.  DDX: concern for UTI  Plan: Follow-up on urine and disposition appropriately.    Physical Exam  BP 119/80   Pulse 63   Temp 99 F (37.2 C) (Oral)   Resp 16   SpO2 100%   CONSTITUTIONAL:  well-appearing, NAD NEURO:  Alert and oriented x 3, no focal deficits EYES:  pupils equal and reactive ENT/NECK:  trachea midline, no JVD CARDIO:  reg rate, reg rhythm, well-perfused PULM:  None labored breathing GI/GU:  Abdomen non-distended, nontender, no flank tenderness, no CVA tenderness. MSK/SPINE:  No gross deformities, no edema SKIN:  no rash obvious, atraumatic, no ecchymosis  PSYCH:  Appropriate speech and behavior  ED Course/Procedures     Procedures  MDM   Patient has no significant urinary symptoms hematuria likely secondary to stent.  Hematuria is obstructing urinalysis from a particularly useful.  Because of this we will have him follow-up with urology for reevaluation.  He understands discharge instructions given to him by prior provider for his clavicular fracture.  Will discharge at this time.     Solon Augusta Brashear, [place] 05/01/20 1644    Benjiman Core, MD 05/02/20 0011
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
## 2020-05-01 NOTE — ED Triage Notes (Signed)
Patient arrived via [facility] escorted by jail staff.   Patient c/o left shoulder pain  Pt had xrays yesterday and told he had a fractured shoulder.   A/Ox4

## 2020-05-01 NOTE — ED Notes (Signed)
Patient back from x-ray 

## 2020-05-01 NOTE — Discharge Instructions (Signed)
Your x-ray showed a clavicle fracture, you see attached instructions.  You can take Tylenol as needed for the pain.  Please follow-up with Dr. Shon Baton, schedule appointment with him when you leave.  You should see him in at least a week.  In regards to your STD symptoms, you will get these results in 2 days.  We did treat you for gonorrhea here today, you can pick up your antibiotics for chlamydia.  Please do not have intercourse for the next 2 weeks.  You can come get this rechecked at the health department or come back here.

## 2020-05-02 LAB — GC/CHLAMYDIA PROBE AMP (~~LOC~~) NOT AT ARMC
Chlamydia: NEGATIVE
Comment: NEGATIVE
Comment: NORMAL
Neisseria Gonorrhea: POSITIVE — AB

## 2020-05-03 ENCOUNTER — Other Ambulatory Visit: Payer: Self-pay | Admitting: Physician Assistant

## 2020-05-03 DIAGNOSIS — M7989 Other specified soft tissue disorders: Secondary | ICD-10-CM

## 2020-05-03 DIAGNOSIS — I872 Venous insufficiency (chronic) (peripheral): Secondary | ICD-10-CM

## 2020-05-06 ENCOUNTER — Other Ambulatory Visit: Payer: Self-pay

## 2020-05-06 ENCOUNTER — Emergency Department (HOSPITAL_COMMUNITY): Payer: Medicare Other

## 2020-05-06 ENCOUNTER — Encounter (HOSPITAL_COMMUNITY): Payer: Self-pay | Admitting: Emergency Medicine

## 2020-05-06 ENCOUNTER — Emergency Department (HOSPITAL_COMMUNITY)
Admission: EM | Admit: 2020-05-06 | Discharge: 2020-05-06 | Disposition: A | Discharge status: Home / Self Care | Payer: Medicare Other | Attending: Emergency Medicine | Admitting: Emergency Medicine

## 2020-05-06 DIAGNOSIS — S42002D Fracture of unspecified part of left clavicle, subsequent encounter for fracture with routine healing: Secondary | ICD-10-CM | POA: Diagnosis not present

## 2020-05-06 DIAGNOSIS — Z87891 Personal history of nicotine dependence: Secondary | ICD-10-CM | POA: Diagnosis not present

## 2020-05-06 DIAGNOSIS — X58XXXD Exposure to other specified factors, subsequent encounter: Secondary | ICD-10-CM | POA: Insufficient documentation

## 2020-05-06 DIAGNOSIS — Z7901 Long term (current) use of anticoagulants: Secondary | ICD-10-CM | POA: Diagnosis not present

## 2020-05-06 DIAGNOSIS — S42002A Fracture of unspecified part of left clavicle, initial encounter for closed fracture: Secondary | ICD-10-CM

## 2020-05-06 DIAGNOSIS — Z79899 Other long term (current) drug therapy: Secondary | ICD-10-CM | POA: Insufficient documentation

## 2020-05-06 DIAGNOSIS — M25512 Pain in left shoulder: Secondary | ICD-10-CM | POA: Insufficient documentation

## 2020-05-06 MED ORDER — HYDROCODONE-ACETAMINOPHEN 5-325 MG PO TABS: 1.0000 | ORAL_TABLET | Freq: Four times a day (QID) | ORAL | 0 refills | Status: DC | PRN
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
## 2020-05-06 NOTE — ED Notes (Signed)
Pt requesting ostomy bag. I informed pt that care plan instructs not to give supplies, as he gets them out patient. Pt expresses some frustration with this, but agrees. AVS reviewed with pt and he is ambulatory out of dept.

## 2020-05-06 NOTE — ED Triage Notes (Signed)
Patient reports left shoulder pain onset yesterday , denies recent injury , wearing left shoulder immobilizer.

## 2020-05-06 NOTE — Discharge Instructions (Addendum)
Please use Tylenol or ibuprofen for pain.  You may use 600 mg ibuprofen every 6 hours or 1000 mg of Tylenol every 6 hours.  You may choose to alternate between the 2.  This would be most effective.  Not to exceed 4 g of Tylenol within 24 hours.  Not to exceed 3200 mg ibuprofen 24 hours.  I am also providing you with Norco which she may use as needed for breakthrough pain.  Of note this medicine does have Tylenol in it.  If you use this please hold off on 1 dose of Tylenol.  Drink plenty of water.  It is important follow-up with your primary care doctor.

## 2020-05-06 NOTE — ED Notes (Signed)
No response for vitals x1 ?

## 2020-05-07 ENCOUNTER — Telehealth: Payer: Self-pay

## 2020-05-07 NOTE — Telephone Encounter (Signed)
Patient has called [facility] to get ostomy supplies. Patient called last weekend also and the weekend before. He states that he cannot get through to the company that provides these resources. Patient has Medicare, and has been set up with primary care services, He did not show up to the appointment last week.  [facility] routinely gives him supplies due to him stating the above. I called [facility] and they agreed with the below plan:  We will no longer be able to provide Raymond Bowen with routine ostomy supplies Raymond Bowen  will have to change suppliers/work with his outpatient providers on this process. If does not feel the current suppliers meet his needs If Raymond. Bowen indeed feels he requires emergency intervention,of course  he is always free to come to any ED.
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
## 2020-05-09 ENCOUNTER — Other Ambulatory Visit: Payer: Self-pay | Admitting: Family Medicine

## 2020-05-09 ENCOUNTER — Telehealth: Payer: Self-pay | Admitting: Family Medicine

## 2020-05-09 DIAGNOSIS — M7989 Other specified soft tissue disorders: Secondary | ICD-10-CM

## 2020-05-09 DIAGNOSIS — M21371 Foot drop, right foot: Secondary | ICD-10-CM

## 2020-05-09 DIAGNOSIS — I872 Venous insufficiency (chronic) (peripheral): Secondary | ICD-10-CM

## 2020-05-09 DIAGNOSIS — M792 Neuralgia and neuritis, unspecified: Secondary | ICD-10-CM

## 2020-05-09 MED ORDER — APIXABAN 5 MG PO TABS: 5.0000 mg | ORAL_TABLET | Freq: Two times a day (BID) | ORAL | 2 refills | Status: DC

## 2020-05-09 MED ORDER — FUROSEMIDE 20 MG PO TABS: ORAL_TABLET | ORAL | 1 refills | Status: DC

## 2020-05-09 NOTE — Telephone Encounter (Signed)
Pt needs to be contacted about the ongoing notes and results of his colostomy bags. States no one is contacting and he just wants an answer. Noted info in file but pt is soo distressed that clinical may need to call him with a better understanding to what options are. Contact [phone]

## 2020-05-09 NOTE — Telephone Encounter (Signed)
Copied from CRM [id number]. Topic: General - Other >> May 09, 2020 11:35 AM Jaquita Rector A wrote: Reason for CRM: Patient called to inform Dr [name] that he is completely out of his colostomy bags 18103 55MG  21 1/2 4 inch. Stated that he was told not to come back to the hospital anymore to be seen that he need to contact his Dr. Please advise Ph# [phone]

## 2020-05-09 NOTE — Telephone Encounter (Signed)
Requested medication (s) are due for refill today: Lasix - no  Requested medication (s) are on the active medication list: Yes  Last refill:  Lasix - 04/11/20  Eliquis - 04/11/20  Future visit scheduled: Yes  Notes to clinic:  See requests.    Requested Prescriptions  Pending Prescriptions Disp Refills   furosemide (LASIX) 20 MG tablet 30 tablet 0    Sig: TAKE 1 TABLET BY MOUTH ONCE DAILY FOR 5 DAYS AS NEEDED LEG  SWELLING      Cardiovascular:  Diuretics - Loop Failed - 05/09/2020 11:54 AM      Failed - Ca in normal range and within 360 days    Calcium  Date Value Ref Range Status  04/07/2020 8.6 (L) 8.9 - 10.3 mg/dL Final   Calcium, Ion  Date Value Ref Range Status  04/24/2017 1.27 1.15 - 1.40 mmol/L Final          Failed - Cr in normal range and within 360 days    Creatinine, Ser  Date Value Ref Range Status  04/07/2020 1.96 (H) 0.61 - 1.24 mg/dL Final   Creatinine, Urine  Date Value Ref Range Status  01/29/2017 450.25 mg/dL Final    Comment:    RESULTS CONFIRMED BY MANUAL DILUTION          Failed - Last BP in normal range    BP Readings from Last 1 Encounters:  05/06/20 (!) 118/93          Passed - K in normal range and within 360 days    Potassium  Date Value Ref Range Status  04/07/2020 3.9 3.5 - 5.1 mmol/L Final          Passed - Na in normal range and within 360 days    Sodium  Date Value Ref Range Status  04/07/2020 136 135 - 145 mmol/L Final  03/23/2019 142 134 - 144 mmol/L Final          Passed - Valid encounter within last 6 months    Recent Outpatient Visits           5 months ago Venous insufficiency of right leg   [facility] [place], [place], [place]   9 months ago Neuropathic pain   [facility] [place], [place], [place]   10 months ago Chronic deep vein thrombosis (DVT) of femoral vein of right lower extremity (HCC)   [facility] [facility] [place], [place], [place]    1 year ago Stage 3 chronic kidney disease (HCC)   [facility] [place], Odette Horns, MD   1 year ago Non-compliance   [facility] Hoy Register, MD       Future Appointments             In 2 weeks Anders Simmonds, [place] [facility]              APIXABAN Everlene Balls) VTE STARTER PACK (10MG  AND 5MG ) 1 each 0    Sig: Take as directed on package: start with two-5mg  tablets twice daily for 7 days. On day 8, switch to one-5mg  tablet twice daily.      Hematology:  Anticoagulants Failed - 05/09/2020 11:54 AM      Failed - HGB in normal range and within 360 days    Hemoglobin  Date Value Ref Range Status  04/07/2020 11.8 (L) 13.0 - 17.0 g/dL Final  Failed - HCT in normal range and within 360 days    HCT  Date Value Ref Range Status  04/07/2020 37.2 (L) 39 - 52 % Final          Failed - Cr in normal range and within 360 days    Creatinine, Ser  Date Value Ref Range Status  04/07/2020 1.96 (H) 0.61 - 1.24 mg/dL Final   Creatinine, Urine  Date Value Ref Range Status  01/29/2017 450.25 mg/dL Final    Comment:    RESULTS CONFIRMED BY MANUAL DILUTION          Passed - PLT in normal range and within 360 days    Platelets  Date Value Ref Range Status  04/07/2020 231 150 - 400 K/uL Final          Passed - Valid encounter within last 12 months    Recent Outpatient Visits           5 months ago Venous insufficiency of right leg   [facility] [place], [place], [place]   9 months ago Neuropathic pain   [facility] [place], [place], [place]   10 months ago Chronic deep vein thrombosis (DVT) of femoral vein of right lower extremity (HCC)   [facility] [facility] [place], Starr School, [place]   1 year ago Stage 3 chronic kidney disease [facility])   [facility] [place], Odette Horns, MD   1 year ago  Non-compliance   [facility] Hoy Register, MD       Future Appointments             In 2 weeks [place], Marzella Schlein, PA-C [facility]

## 2020-05-09 NOTE — Telephone Encounter (Signed)
Pt call, Pt needs to be contacted about the ongoing notes and results of his colostomy bags. States no one is contacting and he just wants an answer. Noted info in file but pt is soo distressed that clinical may need to call him with a better understanding to what options are. Contact [phone]. He want to be call today

## 2020-05-09 NOTE — Telephone Encounter (Signed)
Copied from CRM [id number]. Topic: General - Inquiry >> May 09, 2020 11:14 AM [name] wrote: Reason for CRM: Patient stated that his social worker had him to call and see if the office had any donated colostomy bags, He can be reached at [phone]. Please advise

## 2020-05-09 NOTE — Telephone Encounter (Signed)
Copied from CRM [id number]. Topic: Quick Communication - Rx Refill/Question >> May 09, 2020 11:28 AM Jaquita Rector A wrote: Medication: furosemide (LASIX) 20 MG tablet, APIXABAN (ELIQUIS) (5MG )   Has the patient contacted their pharmacy? Yes.   (Agent: If no, request that the patient contact the pharmacy for the refill.) (Agent: If yes, when and what did the pharmacy advise?)  Preferred Pharmacy (with phone number or street name): [facility] - [place], [place] - [address]  Phone:  [phone] Fax:  [phone]     Agent: Please be advised that RX refills may take up to 3 business days. We ask that you follow-up with your pharmacy.

## 2020-05-09 NOTE — Telephone Encounter (Signed)
Refills have been sent for Eliquis and Lasix.  Where would he like a prescription for his colostomy bag sent so I can send the prescription?

## 2020-05-09 NOTE — Telephone Encounter (Signed)
Duplicate message. 

## 2020-05-09 NOTE — Telephone Encounter (Signed)
Patient is needing a refill on Eliquis, lasix

## 2020-05-10 MED ORDER — MISC. DEVICES MISC: 12 refills | Status: AC

## 2020-05-10 MED ORDER — PREGABALIN 100 MG PO CAPS: 100.0000 mg | ORAL_CAPSULE | Freq: Two times a day (BID) | ORAL | 0 refills | Status: DC

## 2020-05-10 NOTE — Telephone Encounter (Signed)
Done

## 2020-05-10 NOTE — Telephone Encounter (Signed)
Called pt unable to reach or leave VM /RX was sent to pharmacy

## 2020-05-10 NOTE — Telephone Encounter (Signed)
Patient was called and informed of all medication refilled by PCP.  Patient states that he has received his shipment from [place].
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
## 2020-05-24 ENCOUNTER — Ambulatory Visit: Payer: Medicare Other | Attending: Physician Assistant | Admitting: Physician Assistant

## 2020-05-24 ENCOUNTER — Encounter: Payer: Self-pay | Admitting: Physician Assistant

## 2020-05-24 ENCOUNTER — Other Ambulatory Visit: Payer: Self-pay

## 2020-05-24 DIAGNOSIS — M792 Neuralgia and neuritis, unspecified: Secondary | ICD-10-CM | POA: Diagnosis not present

## 2020-05-24 DIAGNOSIS — Z09 Encounter for follow-up examination after completed treatment for conditions other than malignant neoplasm: Secondary | ICD-10-CM

## 2020-05-24 DIAGNOSIS — M21371 Foot drop, right foot: Secondary | ICD-10-CM | POA: Diagnosis not present

## 2020-05-24 DIAGNOSIS — S42009S Fracture of unspecified part of unspecified clavicle, sequela: Secondary | ICD-10-CM

## 2020-05-24 DIAGNOSIS — Z8619 Personal history of other infectious and parasitic diseases: Secondary | ICD-10-CM | POA: Diagnosis not present

## 2020-05-24 MED ORDER — METHOCARBAMOL 500 MG PO TABS: 500.0000 mg | ORAL_TABLET | Freq: Three times a day (TID) | ORAL | 1 refills | Status: DC | PRN

## 2020-05-24 MED ORDER — PREDNISONE 10 MG PO TABS: ORAL_TABLET | ORAL | 0 refills | Status: DC

## 2020-05-24 MED FILL — predniSONE 10 MG TABS: 10 | 6 days supply | Qty: 21 | Fill #0

## 2020-05-24 MED FILL — METHOCARBAMOL 500 MG TABS: 500 | 30 days supply | Qty: 90 | Fill #0

## 2020-05-24 NOTE — Progress Notes (Signed)
Pt states that he needs something for pain

## 2020-06-19 ENCOUNTER — Encounter (HOSPITAL_COMMUNITY): Payer: Self-pay | Admitting: Emergency Medicine

## 2020-06-19 ENCOUNTER — Emergency Department (HOSPITAL_COMMUNITY)
Admission: EM | Admit: 2020-06-19 | Discharge: 2020-06-20 | Disposition: A | Discharge status: Home / Self Care | Payer: Medicare Other | Attending: Emergency Medicine | Admitting: Emergency Medicine

## 2020-06-19 ENCOUNTER — Other Ambulatory Visit: Payer: Self-pay

## 2020-06-19 DIAGNOSIS — Z5321 Procedure and treatment not carried out due to patient leaving prior to being seen by health care provider: Secondary | ICD-10-CM | POA: Insufficient documentation

## 2020-06-19 DIAGNOSIS — R070 Pain in throat: Secondary | ICD-10-CM | POA: Diagnosis not present

## 2020-06-19 LAB — GROUP A STREP BY PCR: Group A Strep by PCR: NOT DETECTED

## 2020-06-19 NOTE — ED Triage Notes (Signed)
Pt c/o sore throat for the past 2 days, no fever or chills.

## 2020-06-21 ENCOUNTER — Other Ambulatory Visit: Payer: Self-pay | Admitting: Family Medicine

## 2020-06-21 DIAGNOSIS — M792 Neuralgia and neuritis, unspecified: Secondary | ICD-10-CM

## 2020-06-21 DIAGNOSIS — M21371 Foot drop, right foot: Secondary | ICD-10-CM

## 2020-06-21 NOTE — Telephone Encounter (Signed)
Requested medication (s) are due for refill today - no  Requested medication (s) are on the active medication list -no  Future visit scheduled -no  Last refill: 05/10/20  Notes to clinic: Request medication- non delegated and not current on list  Requested Prescriptions  Pending Prescriptions Disp Refills   pregabalin (LYRICA) 100 MG capsule [Pharmacy Med Name: Pregabalin 100 MG Oral Capsule] 60 capsule 0    Sig: Take 1 capsule by mouth twice daily      Not Delegated - Neurology:  Anticonvulsants - Controlled Failed - 06/21/2020 11:37 AM      Failed - This refill cannot be delegated      Passed - Valid encounter within last 12 months    Recent Outpatient Visits           4 weeks ago H/O gonorrhea   [facility] [address] [facility] [place], [place], [place]   7 months ago Venous insufficiency of right leg   [facility] [place], Mary[place], [place]   11 months ago Neuropathic pain   [facility] [place], [place], [place]   12 months ago Chronic deep vein thrombosis (DVT) of femoral vein of right lower extremity (HCC)   [facility] [place], [place], [place]   1 year ago Stage 3 chronic kidney disease (HCC)   [facility] [place], Odette [place], [place]                  Requested Prescriptions  Pending Prescriptions Disp Refills   pregabalin (LYRICA) 100 MG capsule [Pharmacy Med Name: Pregabalin 100 MG Oral Capsule] 60 capsule 0    Sig: Take 1 capsule by mouth twice daily      Not Delegated - Neurology:  Anticonvulsants - Controlled Failed - 06/21/2020 11:37 AM      Failed - This refill cannot be delegated      Passed - Valid encounter within last 12 months    Recent Outpatient Visits           4 weeks ago H/O gonorrhea   [facility] [address] [facility] [place], [place], [place]   7 months ago Venous insufficiency of right leg   [facility] [place], [place], [place]   11 months ago Neuropathic pain   [facility] [place], [place], [place]   12 months ago Chronic deep vein thrombosis (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

## 2020-06-22 ENCOUNTER — Telehealth: Payer: Self-pay | Admitting: Pharmacist

## 2020-06-22 MED ORDER — CYCLOBENZAPRINE HCL 10 MG PO TABS: 10.0000 mg | ORAL_TABLET | Freq: Three times a day (TID) | ORAL | 2 refills | Status: DC | PRN

## 2020-06-22 MED FILL — predniSONE 10 MG TABS: 10 | 6 days supply | Qty: 21 | Fill #0

## 2020-06-22 NOTE — Addendum Note (Signed)
Addended by: Hoy Register on: 06/22/2020 01:34 PM   Modules accepted: Orders

## 2020-06-22 NOTE — Telephone Encounter (Signed)
Pt's insurance will not cover methocarbamol. Will only cover cyclobenzaprine. Will route information to PCP to see if we can change to cyclobenzaprine.

## 2020-06-22 NOTE — Telephone Encounter (Signed)
Done

## 2020-07-15 ENCOUNTER — Other Ambulatory Visit: Payer: Self-pay

## 2020-07-15 ENCOUNTER — Emergency Department (HOSPITAL_COMMUNITY)
Admission: EM | Admit: 2020-07-15 | Discharge: 2020-07-15 | Discharge status: Against Medical Advice | Payer: Medicare Other

## 2020-07-15 ENCOUNTER — Emergency Department (HOSPITAL_COMMUNITY): Payer: Medicare Other

## 2020-07-15 ENCOUNTER — Emergency Department (HOSPITAL_COMMUNITY)
Admission: EM | Admit: 2020-07-15 | Discharge: 2020-07-15 | Disposition: A | Discharge status: Home / Self Care | Payer: Medicare Other | Attending: Emergency Medicine | Admitting: Emergency Medicine

## 2020-07-15 DIAGNOSIS — R52 Pain, unspecified: Secondary | ICD-10-CM | POA: Diagnosis not present

## 2020-07-15 DIAGNOSIS — R1031 Right lower quadrant pain: Secondary | ICD-10-CM | POA: Diagnosis present

## 2020-07-15 DIAGNOSIS — M7989 Other specified soft tissue disorders: Secondary | ICD-10-CM | POA: Insufficient documentation

## 2020-07-15 DIAGNOSIS — R5381 Other malaise: Secondary | ICD-10-CM | POA: Diagnosis not present

## 2020-07-15 DIAGNOSIS — N200 Calculus of kidney: Secondary | ICD-10-CM | POA: Insufficient documentation

## 2020-07-15 DIAGNOSIS — M439 Deforming dorsopathy, unspecified: Secondary | ICD-10-CM | POA: Diagnosis not present

## 2020-07-15 DIAGNOSIS — R062 Wheezing: Secondary | ICD-10-CM | POA: Diagnosis not present

## 2020-07-15 DIAGNOSIS — Z87891 Personal history of nicotine dependence: Secondary | ICD-10-CM | POA: Insufficient documentation

## 2020-07-15 DIAGNOSIS — Z7901 Long term (current) use of anticoagulants: Secondary | ICD-10-CM | POA: Diagnosis not present

## 2020-07-15 DIAGNOSIS — I10 Essential (primary) hypertension: Secondary | ICD-10-CM | POA: Diagnosis not present

## 2020-07-15 DIAGNOSIS — Z20822 Contact with and (suspected) exposure to covid-19: Secondary | ICD-10-CM | POA: Diagnosis not present

## 2020-07-15 DIAGNOSIS — Z9889 Other specified postprocedural states: Secondary | ICD-10-CM | POA: Diagnosis not present

## 2020-07-15 DIAGNOSIS — R5383 Other fatigue: Secondary | ICD-10-CM | POA: Diagnosis not present

## 2020-07-15 DIAGNOSIS — R600 Localized edema: Secondary | ICD-10-CM | POA: Insufficient documentation

## 2020-07-15 DIAGNOSIS — R1084 Generalized abdominal pain: Secondary | ICD-10-CM

## 2020-07-15 DIAGNOSIS — R319 Hematuria, unspecified: Secondary | ICD-10-CM | POA: Diagnosis not present

## 2020-07-15 DIAGNOSIS — Z7189 Other specified counseling: Secondary | ICD-10-CM

## 2020-07-15 DIAGNOSIS — K6389 Other specified diseases of intestine: Secondary | ICD-10-CM | POA: Diagnosis not present

## 2020-07-15 DIAGNOSIS — Z466 Encounter for fitting and adjustment of urinary device: Secondary | ICD-10-CM | POA: Diagnosis not present

## 2020-07-15 DIAGNOSIS — Z433 Encounter for attention to colostomy: Secondary | ICD-10-CM | POA: Diagnosis not present

## 2020-07-15 LAB — COMPREHENSIVE METABOLIC PANEL
ALT: 23 U/L (ref 0–44)
AST: 27 U/L (ref 15–41)
Albumin: 3.4 g/dL — ABNORMAL LOW (ref 3.5–5.0)
Alkaline Phosphatase: 80 U/L (ref 38–126)
Anion gap: 9 (ref 5–15)
BUN: 22 mg/dL — ABNORMAL HIGH (ref 6–20)
CO2: 20 mmol/L — ABNORMAL LOW (ref 22–32)
Calcium: 8.8 mg/dL — ABNORMAL LOW (ref 8.9–10.3)
Chloride: 108 mmol/L (ref 98–111)
Creatinine, Ser: 2.14 mg/dL — ABNORMAL HIGH (ref 0.61–1.24)
GFR, Estimated: 39 mL/min — ABNORMAL LOW (ref >60)
Glucose, Bld: 78 mg/dL (ref 70–99)
Potassium: 3.8 mmol/L (ref 3.5–5.1)
Sodium: 137 mmol/L (ref 135–145)
Total Bilirubin: 0.4 mg/dL (ref 0.3–1.2)
Total Protein: 7.3 g/dL (ref 6.5–8.1)

## 2020-07-15 LAB — CBC WITH DIFFERENTIAL/PLATELET
Abs Immature Granulocytes: 0.05 10*3/uL (ref 0.00–0.07)
Basophils Absolute: 0.1 10*3/uL (ref 0.0–0.1)
Basophils Relative: 1 %
Eosinophils Absolute: 0.8 10*3/uL — ABNORMAL HIGH (ref 0.0–0.5)
Eosinophils Relative: 6 %
HCT: 37.7 % — ABNORMAL LOW (ref 39.0–52.0)
Hemoglobin: 11.6 g/dL — ABNORMAL LOW (ref 13.0–17.0)
Immature Granulocytes: 0 %
Lymphocytes Relative: 18 %
Lymphs Abs: 2.2 10*3/uL (ref 0.7–4.0)
MCH: 29.3 pg (ref 26.0–34.0)
MCHC: 30.8 g/dL (ref 30.0–36.0)
MCV: 95.2 fL (ref 80.0–100.0)
Monocytes Absolute: 1.1 10*3/uL — ABNORMAL HIGH (ref 0.1–1.0)
Monocytes Relative: 9 %
Neutro Abs: 8.1 10*3/uL — ABNORMAL HIGH (ref 1.7–7.7)
Neutrophils Relative %: 66 %
Platelets: 277 10*3/uL (ref 150–400)
RBC: 3.96 MIL/uL — ABNORMAL LOW (ref 4.22–5.81)
RDW: 14.6 % (ref 11.5–15.5)
WBC: 12.2 10*3/uL — ABNORMAL HIGH (ref 4.0–10.5)
nRBC: 0 % (ref 0.0–0.2)

## 2020-07-15 LAB — LIPASE, BLOOD: Lipase: 24 U/L (ref 11–51)

## 2020-07-15 LAB — URINALYSIS, ROUTINE W REFLEX MICROSCOPIC
Bacteria, UA: NONE SEEN
Bilirubin Urine: NEGATIVE
Glucose, UA: NEGATIVE mg/dL
Ketones, ur: NEGATIVE mg/dL
Nitrite: NEGATIVE
Protein, ur: 100 mg/dL — AB
RBC / HPF: >50 RBC/hpf — ABNORMAL HIGH (ref 0–5)
Specific Gravity, Urine: 1.025 (ref 1.005–1.030)
WBC, UA: >50 WBC/hpf — ABNORMAL HIGH (ref 0–5)
pH: 6 (ref 5.0–8.0)

## 2020-07-15 LAB — RESP PANEL BY RT-PCR (FLU A&B, COVID) ARPGX2
Influenza A by PCR: NEGATIVE
Influenza B by PCR: NEGATIVE
SARS Coronavirus 2 by RT PCR: NEGATIVE

## 2020-07-15 MED ORDER — HYDROCODONE-ACETAMINOPHEN 5-325 MG PO TABS
1.0000 | ORAL_TABLET | Freq: Once | ORAL | Status: AC
  Administered 2020-07-15: 1 via ORAL
  Filled 2020-07-15: qty 1

## 2020-07-15 NOTE — ED Triage Notes (Signed)
PT BIBA for colostomy supplies, abnormal drainage from colostomy site, and lower quadrant abdominal pain with radiation to the pelvic area. Pt has towel wrapped around colostomy site.
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
## 2020-07-15 NOTE — ED Notes (Addendum)
Pt d/c by MD and is provided w/ d.c instructions and follow up care, Pt is out of the ED ambulatory by self.   This RN was unable to obtain last set of VS due to pt being uncooperative

## 2020-07-15 NOTE — Discharge Instructions (Signed)
You were evaluated emergency department for your abdominal pain.  Your blood work, CT scan, urine for reassuring that there are no signs of infection or acute problem in your abdomen.  We attempted to provide you ostomy supplies, however when you are unable to secure supplies consistent with what you need.  Please call your primary care doctor and ask for supplies in the interim while waiting for your new supplies in the new year.  Additionally you have been seen multiple times for ostomy supplies, and social worker's asked that you please follow-up with your primary care doctor instead. Unfortunately, we will no longer be able to provide you ostomy supplies from the emergency department.  Return the emergency department if any new abdominal pain, vomiting /nausea that does not stop any other new severe symptoms

## 2020-07-16 ENCOUNTER — Emergency Department (HOSPITAL_COMMUNITY)
Admission: EM | Admit: 2020-07-16 | Discharge: 2020-07-16 | Discharge status: Against Medical Advice | Payer: Medicare Other

## 2020-07-16 ENCOUNTER — Other Ambulatory Visit: Payer: Self-pay

## 2020-07-17 ENCOUNTER — Encounter (HOSPITAL_COMMUNITY): Payer: Self-pay

## 2020-07-17 ENCOUNTER — Emergency Department (HOSPITAL_COMMUNITY)
Admission: EM | Admit: 2020-07-17 | Discharge: 2020-07-18 | Disposition: A | Discharge status: Home / Self Care | Payer: Medicare Other | Source: Home / Self Care

## 2020-07-17 ENCOUNTER — Other Ambulatory Visit: Payer: Self-pay

## 2020-07-17 ENCOUNTER — Emergency Department (HOSPITAL_COMMUNITY)
Admission: EM | Admit: 2020-07-17 | Discharge: 2020-07-17 | Disposition: A | Discharge status: Home / Self Care | Payer: Medicare Other | Attending: Emergency Medicine | Admitting: Emergency Medicine

## 2020-07-17 DIAGNOSIS — Z433 Encounter for attention to colostomy: Secondary | ICD-10-CM | POA: Diagnosis not present

## 2020-07-17 DIAGNOSIS — Z5321 Procedure and treatment not carried out due to patient leaving prior to being seen by health care provider: Secondary | ICD-10-CM | POA: Insufficient documentation

## 2020-07-17 DIAGNOSIS — Z7901 Long term (current) use of anticoagulants: Secondary | ICD-10-CM | POA: Diagnosis not present

## 2020-07-17 DIAGNOSIS — Z7189 Other specified counseling: Secondary | ICD-10-CM

## 2020-07-17 DIAGNOSIS — Z87891 Personal history of nicotine dependence: Secondary | ICD-10-CM | POA: Insufficient documentation

## 2020-07-17 DIAGNOSIS — M7989 Other specified soft tissue disorders: Secondary | ICD-10-CM | POA: Insufficient documentation

## 2020-07-17 DIAGNOSIS — R69 Illness, unspecified: Secondary | ICD-10-CM | POA: Diagnosis not present

## 2020-07-17 DIAGNOSIS — R5381 Other malaise: Secondary | ICD-10-CM | POA: Diagnosis not present

## 2020-07-17 NOTE — ED Triage Notes (Signed)
Pt arrives EMS for ostomy bag supplies.

## 2020-07-17 NOTE — ED Notes (Signed)
Pt ambulatory to restroom to empty colostomy bag

## 2020-07-17 NOTE — ED Notes (Signed)
Ostomy supplies provided to patient.
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
## 2020-07-17 NOTE — Discharge Instructions (Addendum)
Please follow-up with your regular doctor regarding ostomy supplies in the future.

## 2020-07-18 ENCOUNTER — Other Ambulatory Visit: Payer: Self-pay

## 2020-07-18 NOTE — ED Triage Notes (Signed)
Pt presents to ED POV. Pt c.o R leg swelling pt states that he has been taking "his pills." but leg is still swollen.

## 2020-07-18 NOTE — ED Notes (Signed)
Called for room no answer

## 2020-07-28 ENCOUNTER — Other Ambulatory Visit: Payer: Self-pay | Admitting: Family Medicine

## 2020-07-28 DIAGNOSIS — M7989 Other specified soft tissue disorders: Secondary | ICD-10-CM

## 2020-07-28 DIAGNOSIS — I872 Venous insufficiency (chronic) (peripheral): Secondary | ICD-10-CM

## 2020-08-17 ENCOUNTER — Other Ambulatory Visit: Payer: Self-pay

## 2020-08-17 ENCOUNTER — Encounter (HOSPITAL_COMMUNITY): Payer: Self-pay

## 2020-08-17 ENCOUNTER — Emergency Department (HOSPITAL_COMMUNITY)
Admission: EM | Admit: 2020-08-17 | Discharge: 2020-08-17 | Disposition: A | Discharge status: Home / Self Care | Payer: Medicare Other | Attending: Emergency Medicine | Admitting: Emergency Medicine

## 2020-08-17 DIAGNOSIS — I10 Essential (primary) hypertension: Secondary | ICD-10-CM | POA: Diagnosis not present

## 2020-08-17 DIAGNOSIS — R1110 Vomiting, unspecified: Secondary | ICD-10-CM | POA: Diagnosis not present

## 2020-08-17 DIAGNOSIS — R52 Pain, unspecified: Secondary | ICD-10-CM | POA: Diagnosis not present

## 2020-08-17 DIAGNOSIS — R1031 Right lower quadrant pain: Secondary | ICD-10-CM | POA: Diagnosis not present

## 2020-08-17 DIAGNOSIS — Z7901 Long term (current) use of anticoagulants: Secondary | ICD-10-CM | POA: Diagnosis not present

## 2020-08-17 DIAGNOSIS — R1084 Generalized abdominal pain: Secondary | ICD-10-CM | POA: Diagnosis not present

## 2020-08-17 DIAGNOSIS — Z87891 Personal history of nicotine dependence: Secondary | ICD-10-CM | POA: Diagnosis not present

## 2020-08-17 NOTE — ED Triage Notes (Signed)
Per EMs- Patient c/o N/v x 1 with EMS. Zofran 4 mg IV given prior to arrival to the ED. Patient also reports no stool in the colostomy for the past 2 hours. EMS reports a small amount of soft stool present in the bag. Patient c/o mid abdominal pain.
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
## 2020-08-22 ENCOUNTER — Ambulatory Visit: Payer: Medicare Other | Admitting: Family Medicine

## 2020-08-28 ENCOUNTER — Other Ambulatory Visit: Payer: Self-pay | Admitting: Family Medicine

## 2020-08-28 DIAGNOSIS — I872 Venous insufficiency (chronic) (peripheral): Secondary | ICD-10-CM

## 2020-08-28 DIAGNOSIS — M7989 Other specified soft tissue disorders: Secondary | ICD-10-CM

## 2020-09-12 ENCOUNTER — Emergency Department (HOSPITAL_COMMUNITY)
Admission: EM | Admit: 2020-09-12 | Discharge: 2020-09-13 | Disposition: A | Discharge status: Home / Self Care | Payer: Medicaid Other | Attending: Emergency Medicine | Admitting: Emergency Medicine

## 2020-09-12 ENCOUNTER — Encounter (HOSPITAL_COMMUNITY): Payer: Self-pay | Admitting: Emergency Medicine

## 2020-09-12 ENCOUNTER — Other Ambulatory Visit: Payer: Self-pay

## 2020-09-12 DIAGNOSIS — Z89021 Acquired absence of right finger(s): Secondary | ICD-10-CM | POA: Insufficient documentation

## 2020-09-12 DIAGNOSIS — M79604 Pain in right leg: Secondary | ICD-10-CM | POA: Diagnosis present

## 2020-09-12 DIAGNOSIS — Z86718 Personal history of other venous thrombosis and embolism: Secondary | ICD-10-CM | POA: Diagnosis not present

## 2020-09-12 DIAGNOSIS — Z7901 Long term (current) use of anticoagulants: Secondary | ICD-10-CM | POA: Insufficient documentation

## 2020-09-12 DIAGNOSIS — N179 Acute kidney failure, unspecified: Secondary | ICD-10-CM | POA: Insufficient documentation

## 2020-09-12 DIAGNOSIS — Z7189 Other specified counseling: Secondary | ICD-10-CM

## 2020-09-12 DIAGNOSIS — R000 Tachycardia, unspecified: Secondary | ICD-10-CM | POA: Diagnosis not present

## 2020-09-12 DIAGNOSIS — Z79899 Other long term (current) drug therapy: Secondary | ICD-10-CM | POA: Insufficient documentation

## 2020-09-12 DIAGNOSIS — Z87891 Personal history of nicotine dependence: Secondary | ICD-10-CM | POA: Insufficient documentation

## 2020-09-12 DIAGNOSIS — R609 Edema, unspecified: Secondary | ICD-10-CM | POA: Insufficient documentation

## 2020-09-12 DIAGNOSIS — Z95828 Presence of other vascular implants and grafts: Secondary | ICD-10-CM | POA: Diagnosis not present

## 2020-09-12 DIAGNOSIS — M79661 Pain in right lower leg: Secondary | ICD-10-CM | POA: Diagnosis not present

## 2020-09-12 DIAGNOSIS — Z433 Encounter for attention to colostomy: Secondary | ICD-10-CM | POA: Insufficient documentation

## 2020-09-12 NOTE — ED Triage Notes (Addendum)
Patient ran out of colostomy bag requesting bag replacement , patient added right lower leg pain , no injury /ambulatory .

## 2020-09-13 ENCOUNTER — Ambulatory Visit (HOSPITAL_COMMUNITY): Admission: RE | Admit: 2020-09-13 | Payer: Medicaid Other | Source: Ambulatory Visit

## 2020-09-13 NOTE — ED Notes (Addendum)
Attempted to provide patient with ostomy kit, he reports that 4" is too large for him. Unit secretary made aware, we will attempt to order him a size down.

## 2020-09-13 NOTE — Discharge Instructions (Signed)
Return tomorrow for your [place]. You will not need to check back into the ED unless your [place] is positive for a new clot.  Follow up outpatient with PCP for reevaluation.

## 2020-09-13 NOTE — ED Notes (Signed)
Patient provided with ostomy kit.
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
## 2020-09-13 NOTE — ED Notes (Signed)
Unit secretary to call materials regarding re-ordered supplies.

## 2020-09-13 NOTE — ED Notes (Signed)
Unit secretary to order ostomy kit

## 2020-09-14 ENCOUNTER — Telehealth: Payer: Self-pay | Admitting: *Deleted

## 2020-09-14 NOTE — Telephone Encounter (Signed)
Transition Care Management Unsuccessful Follow-up Telephone Call  Date of discharge and from where:  09/13/2020 [facility]  Attempts:  1st Attempt  Reason for unsuccessful TCM follow-up call:  No answer/busy

## 2020-09-15 ENCOUNTER — Telehealth: Payer: Self-pay | Admitting: Family Medicine

## 2020-09-15 NOTE — Telephone Encounter (Signed)
Transition Care Management Unsuccessful Follow-up Telephone Call  Date of discharge and from where:  09/13/2020 [facility]  Attempts:  2nd Attempt  Reason for unsuccessful TCM follow-up call:  No answer/busy

## 2020-09-15 NOTE — Telephone Encounter (Signed)
Patient called and is wondering about getting help with his colostomy bags. He stated that his medicaid was not active and that is what is preventing him from getting his colostomy bags. [facility] is showing that his medicaid is active. I advised him to reach out to his supply company and let him know that his PCP office is showing his medicaid is active. He stated the resources that he has been given from [place], I believe [place], is not helpful. He states he cannot pay out of pocket for his colostomy bags and is upset as the hospital is becoming more unwilling to help him out. I advised him I would route a message to our case manager to see if there was anything further we could do. Please follow up if appropriate.

## 2020-09-18 NOTE — Telephone Encounter (Signed)
Call returned to the patient.  He said that his medicaid is not active, he checked with DSS after he spoke to a representative from [facility] last week.   Informed him that Cheryll Dessert, [facility]  Referral specialist checked the medicaid status this afternoon and is shows that it is active.  Instructed the patient to call his caseworker at DSS to verify.  Also reminded him that [facility] does not carry ostomy supplies and he said he understood

## 2020-09-18 NOTE — Telephone Encounter (Signed)
Patient is returning a call to the office.  Please call patient back to discuss at [phone]

## 2020-09-18 NOTE — Telephone Encounter (Signed)
Attempted to contact patient about ostomy supplies.  Call placed to # [phone], message stated that the voicemail is not set up.

## 2020-09-18 NOTE — Telephone Encounter (Signed)
Transition Care Management Unsuccessful Follow-up Telephone Call  Date of discharge and from where:  09/13/2020 [facility]  Attempts:  3rd Attempt  Reason for unsuccessful TCM follow-up call:  No answer/busy

## 2020-11-12 ENCOUNTER — Encounter (HOSPITAL_COMMUNITY): Payer: Self-pay | Admitting: Emergency Medicine

## 2020-11-12 ENCOUNTER — Other Ambulatory Visit: Payer: Self-pay

## 2020-11-12 ENCOUNTER — Emergency Department (HOSPITAL_COMMUNITY)
Admission: EM | Admit: 2020-11-12 | Discharge: 2020-11-13 | Disposition: A | Discharge status: Home / Self Care | Payer: Medicare Other | Attending: Emergency Medicine | Admitting: Emergency Medicine

## 2020-11-12 DIAGNOSIS — M79671 Pain in right foot: Secondary | ICD-10-CM | POA: Insufficient documentation

## 2020-11-12 DIAGNOSIS — K9409 Other complications of colostomy: Secondary | ICD-10-CM | POA: Diagnosis not present

## 2020-11-12 DIAGNOSIS — R5381 Other malaise: Secondary | ICD-10-CM | POA: Diagnosis not present

## 2020-11-12 DIAGNOSIS — Z86718 Personal history of other venous thrombosis and embolism: Secondary | ICD-10-CM | POA: Insufficient documentation

## 2020-11-12 DIAGNOSIS — Z433 Encounter for attention to colostomy: Secondary | ICD-10-CM | POA: Diagnosis not present

## 2020-11-12 DIAGNOSIS — Z87891 Personal history of nicotine dependence: Secondary | ICD-10-CM | POA: Insufficient documentation

## 2020-11-12 DIAGNOSIS — Z7901 Long term (current) use of anticoagulants: Secondary | ICD-10-CM | POA: Insufficient documentation

## 2020-11-12 DIAGNOSIS — K9400 Colostomy complication, unspecified: Secondary | ICD-10-CM | POA: Diagnosis not present

## 2020-11-12 DIAGNOSIS — Z7189 Other specified counseling: Secondary | ICD-10-CM

## 2020-11-12 NOTE — ED Triage Notes (Signed)
Pt rolled over on his colostomy bag and it burst. States that he is out of his supplies. Denies pain, bleeding or change in bowel habits.
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
## 2020-11-12 NOTE — ED Notes (Signed)
Ostomy supplies provided to patient.

## 2020-11-13 ENCOUNTER — Other Ambulatory Visit: Payer: Self-pay

## 2020-11-13 ENCOUNTER — Emergency Department (HOSPITAL_BASED_OUTPATIENT_CLINIC_OR_DEPARTMENT_OTHER): Payer: Medicare Other

## 2020-11-13 ENCOUNTER — Emergency Department (HOSPITAL_COMMUNITY)
Admission: EM | Admit: 2020-11-13 | Discharge: 2020-11-13 | Disposition: A | Discharge status: Home / Self Care | Payer: Medicare Other | Source: Home / Self Care | Attending: Emergency Medicine | Admitting: Emergency Medicine

## 2020-11-13 ENCOUNTER — Encounter (HOSPITAL_COMMUNITY): Payer: Self-pay

## 2020-11-13 DIAGNOSIS — M7989 Other specified soft tissue disorders: Secondary | ICD-10-CM | POA: Diagnosis not present

## 2020-11-13 DIAGNOSIS — Z87891 Personal history of nicotine dependence: Secondary | ICD-10-CM | POA: Insufficient documentation

## 2020-11-13 DIAGNOSIS — Z7901 Long term (current) use of anticoagulants: Secondary | ICD-10-CM | POA: Insufficient documentation

## 2020-11-13 DIAGNOSIS — M79671 Pain in right foot: Secondary | ICD-10-CM

## 2020-11-13 DIAGNOSIS — M79604 Pain in right leg: Secondary | ICD-10-CM | POA: Diagnosis not present

## 2020-11-13 DIAGNOSIS — Z433 Encounter for attention to colostomy: Secondary | ICD-10-CM

## 2020-11-13 DIAGNOSIS — K9409 Other complications of colostomy: Secondary | ICD-10-CM | POA: Diagnosis not present

## 2020-11-13 NOTE — Consult Note (Signed)
WOC Nurse ostomy consult note Stoma type/location: RLQ ileostomy  Last seen 9 months ago when he ran out of supplies. Known to [facility] team.  Patient is independent with ostomy care.  Stomal assessment/size: 1 1/2" pink and moist Peristomal assessment: SCarring to midline abdomen.  Treatment options for stomal/peristomal skin: barrier ring, prefers flat pouches Output soft brown stool Ostomy pouching: 2pc.  2 3/4" pouch with barrier ring.  Please order 5 each of the following:   LAWSON # 2 - 2 3/4" barrier LAWSON # [id number]  2 3/4" pouch LAWSON # [id number]  Barrier rings   Will not follow at this time.  Please re-consult if needed.  [place]le [place] MSN, RN, FNP-BC CWON Wound, Ostomy, Continence Nurse Pager [phone]

## 2020-11-13 NOTE — Discharge Instructions (Addendum)
You have been given 5 sets of supplies for your colostomy.  Follow-up with your doctor as planned to help keep her supply up-to-date.  No new clot was found in the leg.

## 2020-11-13 NOTE — ED Triage Notes (Signed)
Pt states he is out of colostomy bags and is here for another one. Pt also reports chronic right foot pain and states he needs something for pain.

## 2020-11-13 NOTE — ED Notes (Signed)
Pt transported to [facility]
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
## 2020-11-13 NOTE — Progress Notes (Signed)
VASCULAR LAB    Right lower extremity venous duplex has been performed.  See CV proc for preliminary results.   Called Dr. Rubin Payor with results.   Micah Barnier, RVT 11/13/2020, 8:38 AM
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
## 2020-11-23 ENCOUNTER — Emergency Department (HOSPITAL_COMMUNITY)
Admission: EM | Admit: 2020-11-23 | Discharge: 2020-11-23 | Disposition: A | Discharge status: Home / Self Care | Payer: Medicare Other | Attending: Emergency Medicine | Admitting: Emergency Medicine

## 2020-11-23 ENCOUNTER — Emergency Department (HOSPITAL_COMMUNITY): Payer: Medicare Other

## 2020-11-23 ENCOUNTER — Other Ambulatory Visit: Payer: Self-pay

## 2020-11-23 DIAGNOSIS — R1084 Generalized abdominal pain: Secondary | ICD-10-CM | POA: Insufficient documentation

## 2020-11-23 DIAGNOSIS — R059 Cough, unspecified: Secondary | ICD-10-CM | POA: Diagnosis not present

## 2020-11-23 DIAGNOSIS — Z7901 Long term (current) use of anticoagulants: Secondary | ICD-10-CM | POA: Diagnosis not present

## 2020-11-23 DIAGNOSIS — M7918 Myalgia, other site: Secondary | ICD-10-CM | POA: Diagnosis not present

## 2020-11-23 DIAGNOSIS — R110 Nausea: Secondary | ICD-10-CM | POA: Insufficient documentation

## 2020-11-23 DIAGNOSIS — R6883 Chills (without fever): Secondary | ICD-10-CM | POA: Diagnosis not present

## 2020-11-23 DIAGNOSIS — M791 Myalgia, unspecified site: Secondary | ICD-10-CM | POA: Diagnosis not present

## 2020-11-23 DIAGNOSIS — Z20822 Contact with and (suspected) exposure to covid-19: Secondary | ICD-10-CM | POA: Insufficient documentation

## 2020-11-23 DIAGNOSIS — R079 Chest pain, unspecified: Secondary | ICD-10-CM | POA: Insufficient documentation

## 2020-11-23 DIAGNOSIS — R0789 Other chest pain: Secondary | ICD-10-CM | POA: Diagnosis not present

## 2020-11-23 DIAGNOSIS — R0902 Hypoxemia: Secondary | ICD-10-CM | POA: Diagnosis not present

## 2020-11-23 DIAGNOSIS — G4700 Insomnia, unspecified: Secondary | ICD-10-CM | POA: Diagnosis not present

## 2020-11-23 DIAGNOSIS — R5383 Other fatigue: Secondary | ICD-10-CM | POA: Diagnosis not present

## 2020-11-23 DIAGNOSIS — Z87891 Personal history of nicotine dependence: Secondary | ICD-10-CM | POA: Diagnosis not present

## 2020-11-23 LAB — COMPREHENSIVE METABOLIC PANEL
ALT: 31 U/L (ref 0–44)
AST: 27 U/L (ref 15–41)
Albumin: 4.1 g/dL (ref 3.5–5.0)
Alkaline Phosphatase: 116 U/L (ref 38–126)
Anion gap: 7 (ref 5–15)
BUN: 23 mg/dL — ABNORMAL HIGH (ref 6–20)
CO2: 23 mmol/L (ref 22–32)
Calcium: 9.8 mg/dL (ref 8.9–10.3)
Chloride: 104 mmol/L (ref 98–111)
Creatinine, Ser: 2.27 mg/dL — ABNORMAL HIGH (ref 0.61–1.24)
GFR, Estimated: 36 mL/min — ABNORMAL LOW (ref >60)
Glucose, Bld: 105 mg/dL — ABNORMAL HIGH (ref 70–99)
Potassium: 3.7 mmol/L (ref 3.5–5.1)
Sodium: 134 mmol/L — ABNORMAL LOW (ref 135–145)
Total Bilirubin: 0.1 mg/dL — ABNORMAL LOW (ref 0.3–1.2)
Total Protein: 8.7 g/dL — ABNORMAL HIGH (ref 6.5–8.1)

## 2020-11-23 LAB — CBC WITH DIFFERENTIAL/PLATELET
Abs Immature Granulocytes: 0.07 10*3/uL (ref 0.00–0.07)
Basophils Absolute: 0.1 10*3/uL (ref 0.0–0.1)
Basophils Relative: 1 %
Eosinophils Absolute: 0.5 10*3/uL (ref 0.0–0.5)
Eosinophils Relative: 4 %
HCT: 48.2 % (ref 39.0–52.0)
Hemoglobin: 15.3 g/dL (ref 13.0–17.0)
Immature Granulocytes: 1 %
Lymphocytes Relative: 14 %
Lymphs Abs: 1.7 10*3/uL (ref 0.7–4.0)
MCH: 29.2 pg (ref 26.0–34.0)
MCHC: 31.7 g/dL (ref 30.0–36.0)
MCV: 92 fL (ref 80.0–100.0)
Monocytes Absolute: 1.3 10*3/uL — ABNORMAL HIGH (ref 0.1–1.0)
Monocytes Relative: 10 %
Neutro Abs: 8.9 10*3/uL — ABNORMAL HIGH (ref 1.7–7.7)
Neutrophils Relative %: 70 %
Platelets: 323 10*3/uL (ref 150–400)
RBC: 5.24 MIL/uL (ref 4.22–5.81)
RDW: 14.6 % (ref 11.5–15.5)
WBC: 12.6 10*3/uL — ABNORMAL HIGH (ref 4.0–10.5)
nRBC: 0 % (ref 0.0–0.2)

## 2020-11-23 LAB — RESP PANEL BY RT-PCR (FLU A&B, COVID) ARPGX2
Influenza A by PCR: NEGATIVE
Influenza B by PCR: NEGATIVE
SARS Coronavirus 2 by RT PCR: NEGATIVE

## 2020-11-23 LAB — CK: Total CK: 294 U/L (ref 49–397)

## 2020-11-23 LAB — TROPONIN I (HIGH SENSITIVITY): Troponin I (High Sensitivity): 6 ng/L (ref <18)

## 2020-11-23 MED ORDER — ONDANSETRON HCL 4 MG/2ML IJ SOLN
4.0000 mg | Freq: Once | INTRAMUSCULAR | Status: AC
  Administered 2020-11-23: 4 mg via INTRAVENOUS
  Filled 2020-11-23: qty 2

## 2020-11-23 MED ORDER — ACETAMINOPHEN 500 MG PO TABS
500.0000 mg | ORAL_TABLET | Freq: Once | ORAL | Status: AC
  Administered 2020-11-23: 500 mg via ORAL
  Filled 2020-11-23: qty 1

## 2020-11-23 MED ORDER — FENTANYL CITRATE (PF) 100 MCG/2ML IJ SOLN
50.0000 ug | Freq: Once | INTRAMUSCULAR | Status: AC
  Administered 2020-11-23: 50 ug via INTRAVENOUS
  Filled 2020-11-23: qty 2

## 2020-11-23 NOTE — Discharge Instructions (Signed)
Please follow-up with your primary doctor for outpatient follow-up.  If any symptoms change or worsen, please return to the nearest emergency department. 

## 2020-11-23 NOTE — ED Notes (Signed)
Called material management again to ensure ostomy bags are sent. New inventory just now stocked and ostomy bags to be tubed shortly.

## 2020-11-23 NOTE — ED Notes (Signed)
Patient transported to X-ray 

## 2020-11-23 NOTE — ED Triage Notes (Signed)
Pt via EMS from [facility] 6 for insomnia and feeling unwell x 3 days. Fatigue, chills, and nausea. Pt has a care plan.

## 2020-11-23 NOTE — Consult Note (Addendum)
WOC Nurse ostomy follow up Patient currently in H012.  Patient was last seen by [facility] nurse on 11/13/20 and is independent in ostomy care. Please order 5 each of the following for patient to perform ostomy care:  LAWSON # 2 - 2 3/4" barrier LAWSON # [phone]  2 3/4" pouch LAWSON # [phone]  Barrier rings    Patient performs own ostomy care. WOC nurse will not follow at this time.  Please re-consult the WOC team if needed.  Helmut Muster, RN, MSN, CWOCN, CNS-BC, pager [phone]

## 2020-11-23 NOTE — ED Notes (Signed)
Pt discharged at this time. Awaiting ostomy supplies to be sent.
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
## 2021-02-19 ENCOUNTER — Other Ambulatory Visit: Payer: Self-pay | Admitting: Family Medicine

## 2021-02-19 DIAGNOSIS — M21371 Foot drop, right foot: Secondary | ICD-10-CM

## 2021-02-19 DIAGNOSIS — M7989 Other specified soft tissue disorders: Secondary | ICD-10-CM

## 2021-02-19 DIAGNOSIS — M792 Neuralgia and neuritis, unspecified: Secondary | ICD-10-CM

## 2021-02-19 DIAGNOSIS — I872 Venous insufficiency (chronic) (peripheral): Secondary | ICD-10-CM

## 2021-02-19 NOTE — Telephone Encounter (Signed)
Requested medications are due for refill today.  yes  Requested medications are on the active medications list.  yes  Last refill. 06/22/2020 & 08/28/2020  Future visit scheduled.   no  Notes to clinic.  One medication not delegated. Patient is more than 3 months overdue for an office visit.

## 2021-02-21 ENCOUNTER — Emergency Department (HOSPITAL_COMMUNITY)
Admission: EM | Admit: 2021-02-21 | Discharge: 2021-02-21 | Discharge status: Against Medical Advice | Payer: Medicare Other

## 2021-02-21 NOTE — ED Notes (Signed)
Pt provided with colostomy supplies 

## 2021-02-25 ENCOUNTER — Other Ambulatory Visit: Payer: Self-pay

## 2021-02-25 ENCOUNTER — Other Ambulatory Visit (HOSPITAL_COMMUNITY): Payer: Medicare Other

## 2021-02-25 ENCOUNTER — Observation Stay (HOSPITAL_COMMUNITY): Payer: Medicare Other

## 2021-02-25 ENCOUNTER — Inpatient Hospital Stay (HOSPITAL_COMMUNITY)
Admission: EM | Admit: 2021-02-25 | Discharge: 2021-03-01 | DRG: 698 | Disposition: A | Discharge status: Home / Self Care | Payer: Medicare Other | Attending: Internal Medicine | Admitting: Internal Medicine

## 2021-02-25 ENCOUNTER — Emergency Department (HOSPITAL_COMMUNITY): Payer: Medicare Other

## 2021-02-25 DIAGNOSIS — N179 Acute kidney failure, unspecified: Secondary | ICD-10-CM | POA: Diagnosis present

## 2021-02-25 DIAGNOSIS — I4891 Unspecified atrial fibrillation: Secondary | ICD-10-CM | POA: Diagnosis present

## 2021-02-25 DIAGNOSIS — E872 Acidosis: Secondary | ICD-10-CM | POA: Diagnosis present

## 2021-02-25 DIAGNOSIS — Z79899 Other long term (current) drug therapy: Secondary | ICD-10-CM

## 2021-02-25 DIAGNOSIS — Z932 Ileostomy status: Secondary | ICD-10-CM | POA: Diagnosis not present

## 2021-02-25 DIAGNOSIS — N10 Acute tubulo-interstitial nephritis: Secondary | ICD-10-CM

## 2021-02-25 DIAGNOSIS — Z8249 Family history of ischemic heart disease and other diseases of the circulatory system: Secondary | ICD-10-CM

## 2021-02-25 DIAGNOSIS — T83592A Infection and inflammatory reaction due to indwelling ureteral stent, initial encounter: Principal | ICD-10-CM | POA: Diagnosis present

## 2021-02-25 DIAGNOSIS — I82511 Chronic embolism and thrombosis of right femoral vein: Secondary | ICD-10-CM

## 2021-02-25 DIAGNOSIS — M7989 Other specified soft tissue disorders: Secondary | ICD-10-CM

## 2021-02-25 DIAGNOSIS — R079 Chest pain, unspecified: Secondary | ICD-10-CM

## 2021-02-25 DIAGNOSIS — Z87442 Personal history of urinary calculi: Secondary | ICD-10-CM | POA: Diagnosis not present

## 2021-02-25 DIAGNOSIS — I872 Venous insufficiency (chronic) (peripheral): Secondary | ICD-10-CM

## 2021-02-25 DIAGNOSIS — R319 Hematuria, unspecified: Secondary | ICD-10-CM

## 2021-02-25 DIAGNOSIS — F1910 Other psychoactive substance abuse, uncomplicated: Secondary | ICD-10-CM

## 2021-02-25 DIAGNOSIS — Y738 Miscellaneous gastroenterology and urology devices associated with adverse incidents, not elsewhere classified: Secondary | ICD-10-CM | POA: Diagnosis present

## 2021-02-25 DIAGNOSIS — R778 Other specified abnormalities of plasma proteins: Secondary | ICD-10-CM | POA: Diagnosis not present

## 2021-02-25 DIAGNOSIS — Y929 Unspecified place or not applicable: Secondary | ICD-10-CM

## 2021-02-25 DIAGNOSIS — D631 Anemia in chronic kidney disease: Secondary | ICD-10-CM | POA: Diagnosis not present

## 2021-02-25 DIAGNOSIS — Z20822 Contact with and (suspected) exposure to covid-19: Secondary | ICD-10-CM | POA: Diagnosis present

## 2021-02-25 DIAGNOSIS — N1832 Chronic kidney disease, stage 3b: Secondary | ICD-10-CM | POA: Diagnosis present

## 2021-02-25 DIAGNOSIS — G8929 Other chronic pain: Secondary | ICD-10-CM | POA: Diagnosis present

## 2021-02-25 DIAGNOSIS — Z87891 Personal history of nicotine dependence: Secondary | ICD-10-CM

## 2021-02-25 DIAGNOSIS — R531 Weakness: Secondary | ICD-10-CM | POA: Diagnosis not present

## 2021-02-25 DIAGNOSIS — N136 Pyonephrosis: Secondary | ICD-10-CM | POA: Diagnosis present

## 2021-02-25 DIAGNOSIS — Z823 Family history of stroke: Secondary | ICD-10-CM

## 2021-02-25 DIAGNOSIS — D72829 Elevated white blood cell count, unspecified: Secondary | ICD-10-CM

## 2021-02-25 DIAGNOSIS — I248 Other forms of acute ischemic heart disease: Secondary | ICD-10-CM | POA: Diagnosis present

## 2021-02-25 DIAGNOSIS — F1410 Cocaine abuse, uncomplicated: Secondary | ICD-10-CM | POA: Diagnosis present

## 2021-02-25 DIAGNOSIS — G928 Other toxic encephalopathy: Secondary | ICD-10-CM | POA: Diagnosis present

## 2021-02-25 DIAGNOSIS — I82509 Chronic embolism and thrombosis of unspecified deep veins of unspecified lower extremity: Secondary | ICD-10-CM | POA: Diagnosis present

## 2021-02-25 DIAGNOSIS — R21 Rash and other nonspecific skin eruption: Secondary | ICD-10-CM | POA: Diagnosis present

## 2021-02-25 DIAGNOSIS — R4189 Other symptoms and signs involving cognitive functions and awareness: Secondary | ICD-10-CM | POA: Insufficient documentation

## 2021-02-25 DIAGNOSIS — N189 Chronic kidney disease, unspecified: Secondary | ICD-10-CM | POA: Diagnosis present

## 2021-02-25 DIAGNOSIS — R55 Syncope and collapse: Secondary | ICD-10-CM | POA: Diagnosis not present

## 2021-02-25 DIAGNOSIS — N1330 Unspecified hydronephrosis: Secondary | ICD-10-CM

## 2021-02-25 DIAGNOSIS — G9341 Metabolic encephalopathy: Secondary | ICD-10-CM

## 2021-02-25 DIAGNOSIS — Z832 Family history of diseases of the blood and blood-forming organs and certain disorders involving the immune mechanism: Secondary | ICD-10-CM

## 2021-02-25 DIAGNOSIS — N390 Urinary tract infection, site not specified: Secondary | ICD-10-CM | POA: Diagnosis present

## 2021-02-25 DIAGNOSIS — Z86718 Personal history of other venous thrombosis and embolism: Secondary | ICD-10-CM

## 2021-02-25 DIAGNOSIS — Z91013 Allergy to seafood: Secondary | ICD-10-CM

## 2021-02-25 DIAGNOSIS — M21371 Foot drop, right foot: Secondary | ICD-10-CM | POA: Diagnosis present

## 2021-02-25 DIAGNOSIS — Z7901 Long term (current) use of anticoagulants: Secondary | ICD-10-CM

## 2021-02-25 DIAGNOSIS — T405X5A Adverse effect of cocaine, initial encounter: Secondary | ICD-10-CM | POA: Diagnosis present

## 2021-02-25 DIAGNOSIS — K9413 Enterostomy malfunction: Secondary | ICD-10-CM | POA: Diagnosis present

## 2021-02-25 DIAGNOSIS — Z888 Allergy status to other drugs, medicaments and biological substances status: Secondary | ICD-10-CM

## 2021-02-25 DIAGNOSIS — N183 Chronic kidney disease, stage 3 unspecified: Secondary | ICD-10-CM | POA: Diagnosis present

## 2021-02-25 LAB — CBC WITH DIFFERENTIAL/PLATELET
Abs Immature Granulocytes: 0.07 10*3/uL (ref 0.00–0.07)
Basophils Absolute: 0 10*3/uL (ref 0.0–0.1)
Basophils Relative: 0 %
Eosinophils Absolute: 0 10*3/uL (ref 0.0–0.5)
Eosinophils Relative: 0 %
HCT: 33.7 % — ABNORMAL LOW (ref 39.0–52.0)
Hemoglobin: 10.9 g/dL — ABNORMAL LOW (ref 13.0–17.0)
Immature Granulocytes: 0 %
Lymphocytes Relative: 6 %
Lymphs Abs: 0.9 10*3/uL (ref 0.7–4.0)
MCH: 29.9 pg (ref 26.0–34.0)
MCHC: 32.3 g/dL (ref 30.0–36.0)
MCV: 92.3 fL (ref 80.0–100.0)
Monocytes Absolute: 1.2 10*3/uL — ABNORMAL HIGH (ref 0.1–1.0)
Monocytes Relative: 7 %
Neutro Abs: 13.9 10*3/uL — ABNORMAL HIGH (ref 1.7–7.7)
Neutrophils Relative %: 87 %
Platelets: 275 10*3/uL (ref 150–400)
RBC: 3.65 MIL/uL — ABNORMAL LOW (ref 4.22–5.81)
RDW: 13.6 % (ref 11.5–15.5)
WBC: 16.1 10*3/uL — ABNORMAL HIGH (ref 4.0–10.5)
nRBC: 0 % (ref 0.0–0.2)

## 2021-02-25 LAB — BRAIN NATRIURETIC PEPTIDE: B Natriuretic Peptide: 32.4 pg/mL (ref 0.0–100.0)

## 2021-02-25 LAB — COMPREHENSIVE METABOLIC PANEL
ALT: 42 U/L (ref 0–44)
AST: 50 U/L — ABNORMAL HIGH (ref 15–41)
Albumin: 3.4 g/dL — ABNORMAL LOW (ref 3.5–5.0)
Alkaline Phosphatase: 128 U/L — ABNORMAL HIGH (ref 38–126)
Anion gap: 10 (ref 5–15)
BUN: 26 mg/dL — ABNORMAL HIGH (ref 6–20)
CO2: 18 mmol/L — ABNORMAL LOW (ref 22–32)
Calcium: 8.4 mg/dL — ABNORMAL LOW (ref 8.9–10.3)
Chloride: 111 mmol/L (ref 98–111)
Creatinine, Ser: 2.34 mg/dL — ABNORMAL HIGH (ref 0.61–1.24)
GFR, Estimated: 35 mL/min — ABNORMAL LOW (ref >60)
Glucose, Bld: 110 mg/dL — ABNORMAL HIGH (ref 70–99)
Potassium: 3.8 mmol/L (ref 3.5–5.1)
Sodium: 139 mmol/L (ref 135–145)
Total Bilirubin: 0.4 mg/dL (ref 0.3–1.2)
Total Protein: 7.4 g/dL (ref 6.5–8.1)

## 2021-02-25 LAB — TROPONIN I (HIGH SENSITIVITY)
Troponin I (High Sensitivity): 27 ng/L — ABNORMAL HIGH (ref <18)
Troponin I (High Sensitivity): 32 ng/L — ABNORMAL HIGH (ref <18)

## 2021-02-25 LAB — URINALYSIS, ROUTINE W REFLEX MICROSCOPIC
Bacteria, UA: NONE SEEN
Bilirubin Urine: NEGATIVE
Glucose, UA: NEGATIVE mg/dL
Ketones, ur: NEGATIVE mg/dL
Nitrite: NEGATIVE
Protein, ur: 100 mg/dL — AB
RBC / HPF: >50 RBC/hpf — ABNORMAL HIGH (ref 0–5)
Specific Gravity, Urine: 1.016 (ref 1.005–1.030)
WBC, UA: >50 WBC/hpf — ABNORMAL HIGH (ref 0–5)
pH: 6 (ref 5.0–8.0)

## 2021-02-25 LAB — HEPARIN LEVEL (UNFRACTIONATED)
Heparin Unfractionated: 0.47 IU/mL (ref 0.30–0.70)
Heparin Unfractionated: 0.23 IU/mL — ABNORMAL LOW (ref 0.30–0.70)

## 2021-02-25 LAB — I-STAT CHEM 8, ED
BUN: 27 mg/dL — ABNORMAL HIGH (ref 6–20)
Calcium, Ion: 1.09 mmol/L — ABNORMAL LOW (ref 1.15–1.40)
Chloride: 110 mmol/L (ref 98–111)
Creatinine, Ser: 2.4 mg/dL — ABNORMAL HIGH (ref 0.61–1.24)
Glucose, Bld: 106 mg/dL — ABNORMAL HIGH (ref 70–99)
HCT: 34 % — ABNORMAL LOW (ref 39.0–52.0)
Hemoglobin: 11.6 g/dL — ABNORMAL LOW (ref 13.0–17.0)
Potassium: 3.9 mmol/L (ref 3.5–5.1)
Sodium: 142 mmol/L (ref 135–145)
TCO2: 19 mmol/L — ABNORMAL LOW (ref 22–32)

## 2021-02-25 LAB — RAPID URINE DRUG SCREEN, HOSP PERFORMED
Amphetamines: POSITIVE — AB
Barbiturates: NOT DETECTED
Benzodiazepines: NOT DETECTED
Cocaine: POSITIVE — AB
Opiates: NOT DETECTED
Tetrahydrocannabinol: NOT DETECTED

## 2021-02-25 LAB — CK: Total CK: 498 U/L — ABNORMAL HIGH (ref 49–397)

## 2021-02-25 LAB — SARS CORONAVIRUS 2 (TAT 6-24 HRS): SARS Coronavirus 2: NEGATIVE

## 2021-02-25 LAB — HIV ANTIBODY (ROUTINE TESTING W REFLEX): HIV Screen 4th Generation wRfx: NONREACTIVE

## 2021-02-25 LAB — ETHANOL: Alcohol, Ethyl (B): 19 mg/dL — ABNORMAL HIGH (ref <10)

## 2021-02-25 MED ORDER — SODIUM CHLORIDE 0.9 % IV BOLUS
1000.0000 mL | Freq: Once | INTRAVENOUS | Status: AC
  Administered 2021-02-25: 1000 mL via INTRAVENOUS

## 2021-02-25 MED ORDER — ALBUTEROL SULFATE (2.5 MG/3ML) 0.083% IN NEBU: 2.5000 mg | INHALATION_SOLUTION | Freq: Four times a day (QID) | RESPIRATORY_TRACT | Status: DC | PRN

## 2021-02-25 MED ORDER — HEPARIN (PORCINE) 25000 UT/250ML-% IV SOLN
1600.0000 [IU]/h | INTRAVENOUS | Status: DC
  Administered 2021-02-25 (×2): 1450 [IU]/h via INTRAVENOUS
  Administered 2021-02-26 – 2021-02-27 (×2): 1600 [IU]/h via INTRAVENOUS
  Filled 2021-02-25 (×4): qty 250

## 2021-02-25 MED ORDER — MORPHINE SULFATE (PF) 2 MG/ML IV SOLN
2.0000 mg | INTRAVENOUS | Status: DC | PRN
  Administered 2021-02-25 – 2021-02-28 (×13): 2 mg via INTRAVENOUS
  Filled 2021-02-25 (×13): qty 1

## 2021-02-25 MED ORDER — THIAMINE HCL 100 MG/ML IJ SOLN: 100.0000 mg | Freq: Every day | INTRAMUSCULAR | Status: DC

## 2021-02-25 MED ORDER — ADULT MULTIVITAMIN W/MINERALS CH
1.0000 | ORAL_TABLET | Freq: Every day | ORAL | Status: DC
  Administered 2021-02-25 – 2021-03-01 (×5): 1 via ORAL
  Filled 2021-02-25 (×5): qty 1

## 2021-02-25 MED ORDER — ACETAMINOPHEN 650 MG RE SUPP: 650.0000 mg | Freq: Four times a day (QID) | RECTAL | Status: DC | PRN

## 2021-02-25 MED ORDER — LORAZEPAM 1 MG PO TABS: 1.0000 mg | ORAL_TABLET | ORAL | Status: DC | PRN

## 2021-02-25 MED ORDER — ONDANSETRON HCL 4 MG PO TABS: 4.0000 mg | ORAL_TABLET | Freq: Four times a day (QID) | ORAL | Status: DC | PRN

## 2021-02-25 MED ORDER — ONDANSETRON HCL 4 MG/2ML IJ SOLN: 4.0000 mg | Freq: Four times a day (QID) | INTRAMUSCULAR | Status: DC | PRN

## 2021-02-25 MED ORDER — FOLIC ACID 1 MG PO TABS
1.0000 mg | ORAL_TABLET | Freq: Every day | ORAL | Status: DC
  Administered 2021-02-25 – 2021-03-01 (×5): 1 mg via ORAL
  Filled 2021-02-25 (×5): qty 1

## 2021-02-25 MED ORDER — SODIUM CHLORIDE 0.9 % IV SOLN
1.0000 g | INTRAVENOUS | Status: DC
  Administered 2021-02-25 – 2021-02-28 (×4): 1 g via INTRAVENOUS
  Filled 2021-02-25 (×4): qty 10

## 2021-02-25 MED ORDER — HEPARIN BOLUS VIA INFUSION
5000.0000 [IU] | Freq: Once | INTRAVENOUS | Status: AC
  Administered 2021-02-25: 5000 [IU] via INTRAVENOUS
  Filled 2021-02-25: qty 5000

## 2021-02-25 MED ORDER — LORAZEPAM 2 MG/ML IJ SOLN: 1.0000 mg | INTRAMUSCULAR | Status: DC | PRN

## 2021-02-25 MED ORDER — ACETAMINOPHEN 325 MG PO TABS
650.0000 mg | ORAL_TABLET | Freq: Four times a day (QID) | ORAL | Status: DC | PRN
  Administered 2021-03-01: 650 mg via ORAL
  Filled 2021-02-25: qty 2

## 2021-02-25 MED ORDER — THIAMINE HCL 100 MG PO TABS
100.0000 mg | ORAL_TABLET | Freq: Every day | ORAL | Status: DC
  Administered 2021-02-25 – 2021-03-01 (×5): 100 mg via ORAL
  Filled 2021-02-25 (×5): qty 1

## 2021-02-25 MED ORDER — SODIUM CHLORIDE 0.9% FLUSH
3.0000 mL | Freq: Two times a day (BID) | INTRAVENOUS | Status: DC
  Administered 2021-02-25 – 2021-02-28 (×6): 3 mL via INTRAVENOUS

## 2021-02-25 MED ORDER — ASPIRIN 81 MG PO CHEW
324.0000 mg | CHEWABLE_TABLET | Freq: Once | ORAL | Status: AC
  Administered 2021-02-25: 324 mg via ORAL
  Filled 2021-02-25: qty 4

## 2021-02-25 MED ORDER — SODIUM CHLORIDE 0.9 % IV SOLN: INTRAVENOUS | Status: DC

## 2021-02-25 NOTE — ED Notes (Signed)
Pt is complaining that colostomy bag is leaking and it is a little.

## 2021-02-25 NOTE — ED Notes (Signed)
Came from [address]

## 2021-02-25 NOTE — ED Notes (Signed)
Spoke with [name] about patient belongings. Will call back shortly.

## 2021-02-25 NOTE — ED Notes (Signed)
Colostomy bag ordered and hand delivered to [facility] RN 0830

## 2021-02-25 NOTE — ED Notes (Signed)
Pt was given new colostomy bag and wafer and pt put it on himself.
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
## 2021-02-25 NOTE — ED Notes (Signed)
Admitting MD at bedside.
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
## 2021-02-25 NOTE — ED Notes (Signed)
Pt given new colostomy bag and wafer to put on.

## 2021-02-25 NOTE — Consult Note (Addendum)
WOC Nurse ostomy consult note Patient receiving care in [facility] 3E21 Patient independent in ostomy care. Please order 5 each of the following for patient to perform ostomy care:   LAWSON # 2 - 2 3/4" barrier LAWSON # [id number]  2 3/4" pouch LAWSON # [phone]  Barrier rings   May also apply 53M skin barrier wipes (Lawson# [phone]) around the stoma prior to placing a barrier ring   Patient performs own ostomy care. Please use barrier ring to help prevent leakage. WOC nurse will not follow at this time but are available to this patient if needed.  Renaldo Reel Katrinka Blazing, MSN, RN, CMSRN, Angus Seller, [facility] Wound Treatment Associate Pager [phone]
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
## 2021-02-25 NOTE — ED Notes (Signed)
Called pt for room. No response 
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
## 2021-02-25 NOTE — ED Notes (Signed)
Charge RN notified that pt would like to speak with her.

## 2021-02-25 NOTE — ED Notes (Signed)
Pt keeps requesting Colostomy bag. Educated pt that we do not keep those in the ED. Pt stated he came in with EMS with a bag. No bag up in triage. Will contact Floor for colostomy bags.
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
## 2021-02-26 ENCOUNTER — Observation Stay (HOSPITAL_COMMUNITY): Payer: Medicare Other

## 2021-02-26 ENCOUNTER — Inpatient Hospital Stay (HOSPITAL_COMMUNITY): Payer: Medicare Other

## 2021-02-26 ENCOUNTER — Encounter (HOSPITAL_COMMUNITY): Payer: Self-pay | Admitting: Internal Medicine

## 2021-02-26 DIAGNOSIS — Z86718 Personal history of other venous thrombosis and embolism: Secondary | ICD-10-CM | POA: Diagnosis not present

## 2021-02-26 DIAGNOSIS — Y929 Unspecified place or not applicable: Secondary | ICD-10-CM | POA: Diagnosis not present

## 2021-02-26 DIAGNOSIS — R079 Chest pain, unspecified: Secondary | ICD-10-CM | POA: Diagnosis not present

## 2021-02-26 DIAGNOSIS — E872 Acidosis: Secondary | ICD-10-CM | POA: Diagnosis present

## 2021-02-26 DIAGNOSIS — I4891 Unspecified atrial fibrillation: Secondary | ICD-10-CM | POA: Diagnosis present

## 2021-02-26 DIAGNOSIS — K8020 Calculus of gallbladder without cholecystitis without obstruction: Secondary | ICD-10-CM | POA: Diagnosis not present

## 2021-02-26 DIAGNOSIS — T83592A Infection and inflammatory reaction due to indwelling ureteral stent, initial encounter: Secondary | ICD-10-CM | POA: Diagnosis present

## 2021-02-26 DIAGNOSIS — Z466 Encounter for fitting and adjustment of urinary device: Secondary | ICD-10-CM | POA: Diagnosis not present

## 2021-02-26 DIAGNOSIS — N136 Pyonephrosis: Secondary | ICD-10-CM | POA: Diagnosis present

## 2021-02-26 DIAGNOSIS — Z20822 Contact with and (suspected) exposure to covid-19: Secondary | ICD-10-CM | POA: Diagnosis present

## 2021-02-26 DIAGNOSIS — Z8249 Family history of ischemic heart disease and other diseases of the circulatory system: Secondary | ICD-10-CM | POA: Diagnosis not present

## 2021-02-26 DIAGNOSIS — N179 Acute kidney failure, unspecified: Secondary | ICD-10-CM | POA: Diagnosis present

## 2021-02-26 DIAGNOSIS — R310 Gross hematuria: Secondary | ICD-10-CM | POA: Diagnosis not present

## 2021-02-26 DIAGNOSIS — T405X5A Adverse effect of cocaine, initial encounter: Secondary | ICD-10-CM | POA: Diagnosis present

## 2021-02-26 DIAGNOSIS — Z932 Ileostomy status: Secondary | ICD-10-CM | POA: Diagnosis not present

## 2021-02-26 DIAGNOSIS — R0602 Shortness of breath: Secondary | ICD-10-CM | POA: Diagnosis not present

## 2021-02-26 DIAGNOSIS — N3289 Other specified disorders of bladder: Secondary | ICD-10-CM | POA: Diagnosis not present

## 2021-02-26 DIAGNOSIS — K9413 Enterostomy malfunction: Secondary | ICD-10-CM | POA: Diagnosis present

## 2021-02-26 DIAGNOSIS — R55 Syncope and collapse: Secondary | ICD-10-CM | POA: Diagnosis not present

## 2021-02-26 DIAGNOSIS — Z832 Family history of diseases of the blood and blood-forming organs and certain disorders involving the immune mechanism: Secondary | ICD-10-CM | POA: Diagnosis not present

## 2021-02-26 DIAGNOSIS — N10 Acute tubulo-interstitial nephritis: Secondary | ICD-10-CM | POA: Diagnosis not present

## 2021-02-26 DIAGNOSIS — Z823 Family history of stroke: Secondary | ICD-10-CM | POA: Diagnosis not present

## 2021-02-26 DIAGNOSIS — N390 Urinary tract infection, site not specified: Secondary | ICD-10-CM | POA: Diagnosis present

## 2021-02-26 DIAGNOSIS — N1330 Unspecified hydronephrosis: Secondary | ICD-10-CM | POA: Diagnosis not present

## 2021-02-26 DIAGNOSIS — D72829 Elevated white blood cell count, unspecified: Secondary | ICD-10-CM | POA: Diagnosis not present

## 2021-02-26 DIAGNOSIS — Z87442 Personal history of urinary calculi: Secondary | ICD-10-CM | POA: Diagnosis not present

## 2021-02-26 DIAGNOSIS — T83192A Other mechanical complication of urinary stent, initial encounter: Secondary | ICD-10-CM | POA: Diagnosis not present

## 2021-02-26 DIAGNOSIS — Z888 Allergy status to other drugs, medicaments and biological substances status: Secondary | ICD-10-CM | POA: Diagnosis not present

## 2021-02-26 DIAGNOSIS — F1410 Cocaine abuse, uncomplicated: Secondary | ICD-10-CM | POA: Diagnosis present

## 2021-02-26 DIAGNOSIS — M79604 Pain in right leg: Secondary | ICD-10-CM | POA: Diagnosis not present

## 2021-02-26 DIAGNOSIS — Z87891 Personal history of nicotine dependence: Secondary | ICD-10-CM | POA: Diagnosis not present

## 2021-02-26 DIAGNOSIS — R4189 Other symptoms and signs involving cognitive functions and awareness: Secondary | ICD-10-CM | POA: Diagnosis not present

## 2021-02-26 DIAGNOSIS — G928 Other toxic encephalopathy: Secondary | ICD-10-CM | POA: Diagnosis present

## 2021-02-26 DIAGNOSIS — J90 Pleural effusion, not elsewhere classified: Secondary | ICD-10-CM | POA: Diagnosis not present

## 2021-02-26 DIAGNOSIS — Z91013 Allergy to seafood: Secondary | ICD-10-CM | POA: Diagnosis not present

## 2021-02-26 DIAGNOSIS — N132 Hydronephrosis with renal and ureteral calculous obstruction: Secondary | ICD-10-CM | POA: Diagnosis not present

## 2021-02-26 DIAGNOSIS — Y738 Miscellaneous gastroenterology and urology devices associated with adverse incidents, not elsewhere classified: Secondary | ICD-10-CM | POA: Diagnosis present

## 2021-02-26 DIAGNOSIS — F1910 Other psychoactive substance abuse, uncomplicated: Secondary | ICD-10-CM | POA: Diagnosis present

## 2021-02-26 DIAGNOSIS — D631 Anemia in chronic kidney disease: Secondary | ICD-10-CM | POA: Diagnosis not present

## 2021-02-26 DIAGNOSIS — I248 Other forms of acute ischemic heart disease: Secondary | ICD-10-CM | POA: Diagnosis present

## 2021-02-26 DIAGNOSIS — N1832 Chronic kidney disease, stage 3b: Secondary | ICD-10-CM | POA: Diagnosis not present

## 2021-02-26 DIAGNOSIS — I82511 Chronic embolism and thrombosis of right femoral vein: Secondary | ICD-10-CM | POA: Diagnosis not present

## 2021-02-26 DIAGNOSIS — G8929 Other chronic pain: Secondary | ICD-10-CM | POA: Diagnosis present

## 2021-02-26 LAB — ECHOCARDIOGRAM COMPLETE
Area-P 1/2: 3.85 cm2
Height: 67 in
S' Lateral: 2.8 cm
Weight: 2881.6 oz

## 2021-02-26 LAB — URINE CULTURE

## 2021-02-26 LAB — HEPATITIS PANEL, ACUTE
HCV Ab: NONREACTIVE
Hep A IgM: NONREACTIVE
Hep B C IgM: NONREACTIVE
Hepatitis B Surface Ag: NONREACTIVE

## 2021-02-26 LAB — CBC
HCT: 29.4 % — ABNORMAL LOW (ref 39.0–52.0)
Hemoglobin: 9.2 g/dL — ABNORMAL LOW (ref 13.0–17.0)
MCH: 29.2 pg (ref 26.0–34.0)
MCHC: 31.3 g/dL (ref 30.0–36.0)
MCV: 93.3 fL (ref 80.0–100.0)
Platelets: 256 10*3/uL (ref 150–400)
RBC: 3.15 MIL/uL — ABNORMAL LOW (ref 4.22–5.81)
RDW: 13.6 % (ref 11.5–15.5)
WBC: 11.2 10*3/uL — ABNORMAL HIGH (ref 4.0–10.5)
nRBC: 0 % (ref 0.0–0.2)

## 2021-02-26 LAB — COMPREHENSIVE METABOLIC PANEL
ALT: 25 U/L (ref 0–44)
AST: 21 U/L (ref 15–41)
Albumin: 2.5 g/dL — ABNORMAL LOW (ref 3.5–5.0)
Alkaline Phosphatase: 81 U/L (ref 38–126)
Anion gap: 5 (ref 5–15)
BUN: 28 mg/dL — ABNORMAL HIGH (ref 6–20)
CO2: 20 mmol/L — ABNORMAL LOW (ref 22–32)
Calcium: 8.1 mg/dL — ABNORMAL LOW (ref 8.9–10.3)
Chloride: 113 mmol/L — ABNORMAL HIGH (ref 98–111)
Creatinine, Ser: 1.93 mg/dL — ABNORMAL HIGH (ref 0.61–1.24)
GFR, Estimated: 44 mL/min — ABNORMAL LOW (ref >60)
Glucose, Bld: 108 mg/dL — ABNORMAL HIGH (ref 70–99)
Potassium: 4.1 mmol/L (ref 3.5–5.1)
Sodium: 138 mmol/L (ref 135–145)
Total Bilirubin: 0.5 mg/dL (ref 0.3–1.2)
Total Protein: 5.4 g/dL — ABNORMAL LOW (ref 6.5–8.1)

## 2021-02-26 LAB — RPR
RPR Ser Ql: REACTIVE — AB
RPR Titer: 1:2 {titer}

## 2021-02-26 LAB — PHOSPHORUS: Phosphorus: 3.9 mg/dL (ref 2.5–4.6)

## 2021-02-26 LAB — MAGNESIUM: Magnesium: 1.4 mg/dL — ABNORMAL LOW (ref 1.7–2.4)

## 2021-02-26 LAB — HEPARIN LEVEL (UNFRACTIONATED): Heparin Unfractionated: 0.43 IU/mL (ref 0.30–0.70)

## 2021-02-26 NOTE — Consult Note (Addendum)
WOC Nurse ostomy consult note Patient receiving care in [facility] 3E21 Stoma type/location: RLQ ileostomy Stomal assessment/size: Pink flush with skin Peristomal assessment: Skin irritation surrounding the stoma. Bleeding when cleaned Treatment options for stomal/peristomal skin:  Instruction for "Crusting" with Stoma Powder and Skin Barrier Wipes: After cleaning around the stoma WITH WATER ONLY, sprinkle Stoma Powder on the irritated skin. Spread the powder around with your finger.  The powder will adhere ONLY to wet, raw skin.  Brush any loose powder off. DAB the powder that stuck to the irritated skin with a skin barrier wipe.  Do NOT rub, only dab the powder to moisten it. Allow the powder to air dry.  You can repeat the process of spreading powder and dabbing up to three times, allowing each process to air dry. Place the prepared pouching system around the stoma.  Output 400 cc of brown liquid stool Ostomy pouching: 2pc.2 3/4 used today but have ordered one piece convex [place] # [phone]) He has barrier rings and skin barrier wipes at the bedside. Stoma powder [place] #6) has been ordered.   Changed the pouch however the stool was continuously spirting out making it difficult to get the pouch on in between. Will try a convex pouch if this pouch continues to leak. Will follow up with this patient tomorrow to see how things are going.   Renaldo Reel Katrinka Blazing, MSN, RN, CMSRN, Angus Seller, [facility] Wound Treatment Associate Pager [phone]
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
## 2021-02-26 NOTE — Progress Notes (Signed)
  Echocardiogram 2D Echocardiogram has been performed.  Raymond Bowen 02/26/2021, 12:02 PM

## 2021-02-26 NOTE — Progress Notes (Signed)
Lower extremity venous has been completed.   Preliminary results in CV Proc.   Blanch Media 02/26/2021 3:48 PM

## 2021-02-26 NOTE — Progress Notes (Signed)
Pt's urine bloody. Heparin paused. MD and pharmacy notified.
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
## 2021-02-26 NOTE — Progress Notes (Signed)
MD at bedside. Heparin restarted per MD. Will reevaluate after VQ scan.

## 2021-02-27 ENCOUNTER — Inpatient Hospital Stay (HOSPITAL_COMMUNITY): Payer: Medicare Other

## 2021-02-27 LAB — BASIC METABOLIC PANEL
Anion gap: 3 — ABNORMAL LOW (ref 5–15)
BUN: 19 mg/dL (ref 6–20)
CO2: 18 mmol/L — ABNORMAL LOW (ref 22–32)
Calcium: 8.3 mg/dL — ABNORMAL LOW (ref 8.9–10.3)
Chloride: 115 mmol/L — ABNORMAL HIGH (ref 98–111)
Creatinine, Ser: 1.44 mg/dL — ABNORMAL HIGH (ref 0.61–1.24)
GFR, Estimated: >60 mL/min (ref >60)
Glucose, Bld: 91 mg/dL (ref 70–99)
Potassium: 4.1 mmol/L (ref 3.5–5.1)
Sodium: 136 mmol/L (ref 135–145)

## 2021-02-27 LAB — CBC
HCT: 30.7 % — ABNORMAL LOW (ref 39.0–52.0)
Hemoglobin: 9.6 g/dL — ABNORMAL LOW (ref 13.0–17.0)
MCH: 29.3 pg (ref 26.0–34.0)
MCHC: 31.3 g/dL (ref 30.0–36.0)
MCV: 93.6 fL (ref 80.0–100.0)
Platelets: 253 10*3/uL (ref 150–400)
RBC: 3.28 MIL/uL — ABNORMAL LOW (ref 4.22–5.81)
RDW: 13.8 % (ref 11.5–15.5)
WBC: 10 10*3/uL (ref 4.0–10.5)
nRBC: 0 % (ref 0.0–0.2)

## 2021-02-27 LAB — PHOSPHORUS: Phosphorus: 3.2 mg/dL (ref 2.5–4.6)

## 2021-02-27 LAB — MAGNESIUM: Magnesium: 1.2 mg/dL — ABNORMAL LOW (ref 1.7–2.4)

## 2021-02-27 LAB — T.PALLIDUM AB, TOTAL: T Pallidum Abs: REACTIVE — AB

## 2021-02-27 LAB — HEPARIN LEVEL (UNFRACTIONATED): Heparin Unfractionated: 0.33 IU/mL (ref 0.30–0.70)

## 2021-02-27 MED ORDER — TECHNETIUM TO 99M ALBUMIN AGGREGATED
4.3000 | Freq: Once | INTRAVENOUS | Status: AC | PRN
  Administered 2021-02-27: 4.3 via INTRAVENOUS

## 2021-02-27 MED ORDER — MAGNESIUM SULFATE 4 GM/100ML IV SOLN
4.0000 g | Freq: Once | INTRAVENOUS | Status: AC
  Administered 2021-02-27: 4 g via INTRAVENOUS
  Filled 2021-02-27 (×3): qty 100
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
## 2021-02-27 NOTE — Plan of Care (Signed)
  Problem: Clinical Measurements: Goal: Cardiovascular complication will be avoided Outcome: Progressing   Problem: Coping: Goal: Level of anxiety will decrease Outcome: Progressing   

## 2021-02-28 ENCOUNTER — Telehealth (HOSPITAL_COMMUNITY): Payer: Self-pay

## 2021-02-28 LAB — CBC
HCT: 30.7 % — ABNORMAL LOW (ref 39.0–52.0)
Hemoglobin: 9.5 g/dL — ABNORMAL LOW (ref 13.0–17.0)
MCH: 29.2 pg (ref 26.0–34.0)
MCHC: 30.9 g/dL (ref 30.0–36.0)
MCV: 94.5 fL (ref 80.0–100.0)
Platelets: 250 10*3/uL (ref 150–400)
RBC: 3.25 MIL/uL — ABNORMAL LOW (ref 4.22–5.81)
RDW: 13.9 % (ref 11.5–15.5)
WBC: 10.1 10*3/uL (ref 4.0–10.5)
nRBC: 0 % (ref 0.0–0.2)

## 2021-02-28 LAB — BASIC METABOLIC PANEL
Anion gap: 4 — ABNORMAL LOW (ref 5–15)
BUN: 17 mg/dL (ref 6–20)
CO2: 19 mmol/L — ABNORMAL LOW (ref 22–32)
Calcium: 8.5 mg/dL — ABNORMAL LOW (ref 8.9–10.3)
Chloride: 113 mmol/L — ABNORMAL HIGH (ref 98–111)
Creatinine, Ser: 1.45 mg/dL — ABNORMAL HIGH (ref 0.61–1.24)
GFR, Estimated: >60 mL/min (ref >60)
Glucose, Bld: 103 mg/dL — ABNORMAL HIGH (ref 70–99)
Potassium: 4.2 mmol/L (ref 3.5–5.1)
Sodium: 136 mmol/L (ref 135–145)

## 2021-02-28 LAB — MAGNESIUM: Magnesium: 1.8 mg/dL (ref 1.7–2.4)

## 2021-02-28 MED ORDER — MAGNESIUM SULFATE 4 GM/100ML IV SOLN: 4.0000 g | Freq: Once | INTRAVENOUS | Status: DC

## 2021-02-28 NOTE — Progress Notes (Signed)
Patient says he takes certain home meds that he hasn't been getting here. He is requesting them tonight, provider paged to verify.

## 2021-02-28 NOTE — Social Work (Signed)
CSW attempted to see pt for substance consult, RN was in the room and asked CSW to come back. CSW will follow up with pt.
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
## 2021-03-01 ENCOUNTER — Other Ambulatory Visit: Payer: Self-pay | Admitting: Urology

## 2021-03-01 DIAGNOSIS — G9341 Metabolic encephalopathy: Secondary | ICD-10-CM

## 2021-03-01 DIAGNOSIS — N390 Urinary tract infection, site not specified: Secondary | ICD-10-CM | POA: Insufficient documentation

## 2021-03-01 DIAGNOSIS — N1330 Unspecified hydronephrosis: Secondary | ICD-10-CM

## 2021-03-01 DIAGNOSIS — N10 Acute tubulo-interstitial nephritis: Secondary | ICD-10-CM

## 2021-03-01 DIAGNOSIS — A419 Sepsis, unspecified organism: Secondary | ICD-10-CM | POA: Insufficient documentation

## 2021-03-01 LAB — BASIC METABOLIC PANEL
Anion gap: 5 (ref 5–15)
BUN: 16 mg/dL (ref 6–20)
CO2: 20 mmol/L — ABNORMAL LOW (ref 22–32)
Calcium: 8.8 mg/dL — ABNORMAL LOW (ref 8.9–10.3)
Chloride: 112 mmol/L — ABNORMAL HIGH (ref 98–111)
Creatinine, Ser: 1.43 mg/dL — ABNORMAL HIGH (ref 0.61–1.24)
GFR, Estimated: >60 mL/min (ref >60)
Glucose, Bld: 105 mg/dL — ABNORMAL HIGH (ref 70–99)
Potassium: 4.3 mmol/L (ref 3.5–5.1)
Sodium: 137 mmol/L (ref 135–145)

## 2021-03-01 LAB — MAGNESIUM: Magnesium: 1.4 mg/dL — ABNORMAL LOW (ref 1.7–2.4)

## 2021-03-01 MED ORDER — CEPHALEXIN 250 MG PO CAPS
250.0000 mg | ORAL_CAPSULE | Freq: Three times a day (TID) | ORAL | Status: DC
  Administered 2021-03-01: 250 mg via ORAL
  Filled 2021-03-01: qty 1

## 2021-03-01 MED ORDER — PREGABALIN 100 MG PO CAPS
100.0000 mg | ORAL_CAPSULE | Freq: Two times a day (BID) | ORAL | Status: DC
  Administered 2021-03-01: 100 mg via ORAL
  Filled 2021-03-01: qty 1

## 2021-03-01 MED ORDER — FUROSEMIDE 20 MG PO TABS
20.0000 mg | ORAL_TABLET | Freq: Every day | ORAL | Status: DC
Start: 2021-03-01 — End: 2021-03-29

## 2021-03-01 MED ORDER — MAGNESIUM SULFATE 2 GM/50ML IV SOLN
2.0000 g | Freq: Once | INTRAVENOUS | Status: AC
  Administered 2021-03-01: 2 g via INTRAVENOUS
  Filled 2021-03-01: qty 50

## 2021-03-01 MED ORDER — CEPHALEXIN 250 MG PO CAPS: 250.0000 mg | ORAL_CAPSULE | Freq: Three times a day (TID) | ORAL | 0 refills | Status: AC

## 2021-03-01 NOTE — Discharge Summary (Signed)
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   PHURINE 6.0 02/25/2021 0850   GLUCOSEU NEGATIVE 02/25/2021 0850   HGBUR LARGE (A) 02/25/2021 0850   BILIRUBINUR NEGATIVE 02/25/2021 0850   KETONESUR NEGATIVE 02/25/2021 0850   PROTEINUR 100 (A) 02/25/2021 0850   UROBILINOGEN 0.2 06/02/2015 1100   NITRITE NEGATIVE 02/25/2021 0850   LEUKOCYTESUR LARGE (A) 02/25/2021 0850   Sepsis Labs Invalid input(s): PROCALCITONIN,  WBC,  LACTICIDVEN Microbiology Recent Results (from the past 240 hour(s))  SARS CORONAVIRUS 2 (TAT 6-24 HRS) Nasopharyngeal Nasopharyngeal Swab     Status: None   Collection Time: 02/25/21  6:16 AM   Specimen: Nasopharyngeal Swab  Result Value Ref Range Status   SARS Coronavirus 2 NEGATIVE NEGATIVE Final    Comment: (NOTE) SARS-CoV-2 target nucleic acids are NOT DETECTED.  The SARS-CoV-2 RNA is generally detectable in upper and  lower respiratory specimens during the acute phase of infection. Negative results do not preclude SARS-CoV-2 infection, do not rule out co-infections with other pathogens, and should not be used as the sole basis for treatment or other patient management decisions. Negative results must be combined with clinical observations, patient history, and epidemiological information. The expected result is Negative.  Fact Sheet for Patients: HairSlick.no  Fact Sheet for Healthcare Providers: quierodirigir.com  This test is not yet approved or cleared by the [place] FDA and  has been authorized for detection and/or diagnosis of SARS-CoV-2 by FDA under an Emergency Use Authorization (EUA). This EUA will remain  in effect (meaning this test can be used) for the duration of the COVID-19 declaration under Se ction 564(b)(1) of the Act, 21 U.S.C. section 360bbb-3(b)(1), unless the authorization is terminated or revoked sooner.  Performed at [facility], [address], [place], [place] [address]   Urine Culture     Status: Abnormal   Collection Time: 02/25/21  9:43 AM   Specimen: Urine, Clean Catch  Result Value Ref Range Status   Specimen Description URINE, CLEAN CATCH  Final   Special Requests   Final    NONE Performed at [facility], [address] [place], [place] [address]    Culture MULTIPLE SPECIES PRESENT, SUGGEST RECOLLECTION (A)  Final   Report Status 02/26/2021 FINAL  Final     Time coordinating discharge: 45 minutes  SIGNED:   Coralie KeensMauricio Daniel Jim Lundin, MD  [facility] 03/01/2021, 8:23 AM

## 2021-03-01 NOTE — Care Management Important Message (Signed)
Important Message  Patient Details  Name: Raymond Bowen MRN: [id number] Date of Birth: January 12, 1976   Medicare Important Message Given:  Yes     Renie Ora 03/01/2021, 10:06 AM

## 2021-03-01 NOTE — Social Work (Signed)
CSW provided pt with clothes and bus passes. CSW no longer following.
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
## 2021-03-01 NOTE — Progress Notes (Signed)
Patient requests pain medication, provider notified.

## 2021-03-02 ENCOUNTER — Telehealth: Payer: Self-pay

## 2021-03-02 NOTE — Telephone Encounter (Signed)
Transition Care Management Unsuccessful Follow-up Telephone Call  Date of discharge and from where:  [facility] 08/111/2022  Attempts:  1st Attempt  Reason for unsuccessful TCM follow-up call: Called pt at [phone] , unable to have discuss HFU discharge at this time. Pt answered, loud noise background, asked if this a good time for the call to take place , pt hang up the phone.     Unable to reach patient pt again or left vM

## 2021-03-03 ENCOUNTER — Emergency Department (HOSPITAL_COMMUNITY)
Admission: EM | Admit: 2021-03-03 | Discharge: 2021-03-03 | Disposition: A | Discharge status: Home / Self Care | Payer: Medicare Other | Attending: Emergency Medicine | Admitting: Emergency Medicine

## 2021-03-03 ENCOUNTER — Other Ambulatory Visit: Payer: Self-pay

## 2021-03-03 ENCOUNTER — Encounter (HOSPITAL_COMMUNITY): Payer: Self-pay | Admitting: Emergency Medicine

## 2021-03-03 DIAGNOSIS — Z433 Encounter for attention to colostomy: Secondary | ICD-10-CM | POA: Insufficient documentation

## 2021-03-03 DIAGNOSIS — Z87891 Personal history of nicotine dependence: Secondary | ICD-10-CM | POA: Insufficient documentation

## 2021-03-03 DIAGNOSIS — N183 Chronic kidney disease, stage 3 unspecified: Secondary | ICD-10-CM | POA: Insufficient documentation

## 2021-03-03 DIAGNOSIS — R531 Weakness: Secondary | ICD-10-CM | POA: Diagnosis not present

## 2021-03-03 DIAGNOSIS — D631 Anemia in chronic kidney disease: Secondary | ICD-10-CM | POA: Insufficient documentation
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
## 2021-03-03 NOTE — ED Triage Notes (Signed)
Patient is complaining of ostomy bag busted. Patient came by [facility].

## 2021-03-03 NOTE — Discharge Instructions (Addendum)
Thank you for allowing me to care for you today in the Emergency Department.   Follow-up with your PCP or with the ostomy nurse for additional supplies.  Continue to take your home antibiotics from your recent hospitalization.  Return to the emergency department for new or worsening symptoms.

## 2021-03-03 NOTE — ED Notes (Signed)
Changed patient colostomy. Explained to patient he needs to call home health agency to get supplies.

## 2021-03-05 ENCOUNTER — Telehealth: Payer: Self-pay

## 2021-03-05 ENCOUNTER — Telehealth: Payer: Self-pay | Admitting: Family Medicine

## 2021-03-05 NOTE — Telephone Encounter (Signed)
Pt is calling to ask he just got out of the hospital. His colostomy base and bags do not come until Wednesday does that office have colostomy base and bag. Is there any where where he can get 1-2 bags and base. Please advise CB- [phone]

## 2021-03-05 NOTE — Telephone Encounter (Signed)
Transition Care Management Unsuccessful Follow-up Telephone Call  Date of discharge and from where:  03/01/2021, [facility] ED- 03/03/2021.   Attempts:  2nd Attempt  Reason for unsuccessful TCM follow-up call:  Unable to reach patient- calls placed to multiple numbers.  # [phone], voicemail not set up. # [phone], phone just rings, no option to leave a voicemail message. # [phone], number not in service.

## 2021-03-06 ENCOUNTER — Telehealth: Payer: Self-pay

## 2021-03-06 ENCOUNTER — Emergency Department (HOSPITAL_COMMUNITY)
Admission: EM | Admit: 2021-03-06 | Discharge: 2021-03-06 | Disposition: A | Discharge status: Home / Self Care | Payer: Medicare Other | Attending: Emergency Medicine | Admitting: Emergency Medicine

## 2021-03-06 DIAGNOSIS — N183 Chronic kidney disease, stage 3 unspecified: Secondary | ICD-10-CM | POA: Insufficient documentation

## 2021-03-06 DIAGNOSIS — Z433 Encounter for attention to colostomy: Secondary | ICD-10-CM | POA: Insufficient documentation

## 2021-03-06 DIAGNOSIS — Z87891 Personal history of nicotine dependence: Secondary | ICD-10-CM | POA: Diagnosis not present

## 2021-03-06 NOTE — Patient Instructions (Addendum)
DUE TO COVID-19 ONLY ONE VISITOR IS ALLOWED TO COME WITH YOU AND STAY IN THE WAITING ROOM ONLY DURING PRE OP AND PROCEDURE.   **NO VISITORS ARE ALLOWED IN THE SHORT STAY AREA OR RECOVERY ROOM!!**    Your procedure is scheduled on: Monday, 03-12-21   Report to [facility] Main  Entrance    Report to admitting at 6:45 AM   Call this number if you have problems the morning of surgery [phone]   Do not eat food :After Midnight.   May have liquids until 6:00 AM day of surgery  CLEAR LIQUID DIET  Foods Allowed                                                                     Foods Excluded  Water, Black Coffee and tea, regular and decaf              liquids that you cannot  Plain Jell-O in any flavor  (No red)                                    see through such as: Fruit ices (not with fruit pulp)                                      milk, soups, orange juice              Iced Popsicles (No red)                                      All solid food                                   Apple juices Sports drinks like Gatorade (No red) Lightly seasoned clear broth or consume(fat free) Sugar, honey syrup     Oral Hygiene is also important to reduce your risk of infection.                                    Remember - BRUSH YOUR TEETH THE MORNING OF SURGERY WITH YOUR REGULAR TOOTHPASTE   Do NOT smoke after Midnight   Take these medicines the morning of surgery with A SIP OF WATER:  Lyrica                              You may not have any metal on your body including  jewelry, and body piercing             Do not wear  lotions, powders,cologne, or deodorant               Men may shave face and neck.   Do not bring valuables to the hospital. [facility] IS NOT RESPONSIBLE   FOR VALUABLES.   Contacts, dentures or  bridgework may not be worn into surgery.     Patients discharged the day of surgery will not be allowed to drive home.  Please read over the following fact  sheets you were given: IF YOU HAVE QUESTIONS ABOUT YOUR PRE OP INSTRUCTIONS PLEASE CALL [phone] [facility] - Preparing for Surgery Before surgery, you can play an important role.  Because skin is not sterile, your skin needs to be as free of germs as possible.  You can reduce the number of germs on your skin by washing with CHG (chlorahexidine gluconate) soap before surgery.  CHG is an antiseptic cleaner which kills germs and bonds with the skin to continue killing germs even after washing. Please DO NOT use if you have an allergy to CHG or antibacterial soaps.  If your skin becomes reddened/irritated stop using the CHG and inform your nurse when you arrive at Short Stay. Do not shave (including legs and underarms) for at least 48 hours prior to the first CHG shower.  You may shave your face/neck.  Please follow these instructions carefully:  1.  Shower with CHG Soap the night before surgery and the  morning of surgery.  2.  If you choose to wash your hair, wash your hair first as usual with your normal  shampoo.  3.  After you shampoo, rinse your hair and body thoroughly to remove the shampoo.                             4.  Use CHG as you would any other liquid soap.  You can apply chg directly to the skin and wash.  Gently with a scrungie or clean washcloth.  5.  Apply the CHG Soap to your body ONLY FROM THE NECK DOWN.   Do   not use on face/ open                           Wound or open sores. Avoid contact with eyes, ears mouth and   genitals (private parts).                       Wash face,  Genitals (private parts) with your normal soap.             6.  Wash thoroughly, paying special attention to the area where your    surgery  will be performed.  7.  Thoroughly rinse your body with warm water from the neck down.  8.  DO NOT shower/wash with your normal soap after using and rinsing off the CHG Soap.                9.  Pat yourself dry with a clean towel.            10.  Wear  clean pajamas.            11.  Place clean sheets on your bed the night of your first shower and do not  sleep with pets. Day of Surgery : Do not apply any lotions/deodorants the morning of surgery.  Please wear clean clothes to the hospital/surgery center.  FAILURE TO FOLLOW THESE INSTRUCTIONS MAY RESULT IN THE CANCELLATION OF YOUR SURGERY  PATIENT SIGNATURE_________________________________  NURSE SIGNATURE__________________________________  ________________________________________________________________________

## 2021-03-06 NOTE — Discharge Instructions (Addendum)
Please follow up with your primary care provider.  Please have your ostomy supplies sent to your house.  It is not appropriate to visit the ER for ostomy supplies.

## 2021-03-06 NOTE — Progress Notes (Addendum)
COVID Vaccine Completed: No Date COVID Vaccine completed: Has received booster: COVID vaccine manufacturer: Pfizer    [employer]   Date of COVID positive in last 90 days: No  PCP - [facility]. Hoy Register Cardiologist - N/A  Chest x-ray - 02-27-21 Epic EKG - 02-26-21 Epic Stress Test - N/A ECHO - 02-26-21 Epic Cardiac Cath - N/A Pacemaker/ICD device last checked: Spinal Cord Stimulator:  Sleep Study - N/A CPAP -   Fasting Blood Sugar - N/A Checks Blood Sugar _____ times a day  Blood Thinner Instructions:  Xarelto.  Patient continues to take, was not advised to stop Aspirin Instructions: Last Dose:  Activity level:  Can go up a flight of stairs and perform activities of daily living without stopping and without symptoms of chest pain or shortness of breath.       Anesthesia review: Recent syncopal episode with complaints of chest pain.  CKD.  Patient states that he has not had chest pain since syncopal episode  Patient denies shortness of breath, fever, cough and chest pain at PAT appointment   Patient verbalized understanding of instructions that were given to them at the PAT appointment. Patient was also instructed that they will need to review over the PAT instructions again at home before surgery.

## 2021-03-06 NOTE — ED Notes (Signed)
Secretary ordering supplies

## 2021-03-06 NOTE — ED Notes (Signed)
Pt ambulated to the bathroom by self to replace new ostomy bag.
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
## 2021-03-06 NOTE — ED Notes (Signed)
Colostomy bag/ supplies given. Pt also requested for a sandwich bag.
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
## 2021-03-06 NOTE — Telephone Encounter (Signed)
Transition Care Management Unsuccessful Follow-up Telephone Call  Date of discharge and from where:  03/01/2021, [facility]  ED- 03/03/2021 ED - 03/06/2021 - currently in ED  Attempts:  3rd Attempt  Reason for unsuccessful TCM follow-up call:  Unable to leave message - voicemail not set up on # [phone].  Patient has appointment with Dr [name] 03/29/2021 @ [facility]

## 2021-03-06 NOTE — ED Triage Notes (Signed)
Pt states his colostomy bag in busted, states he doesn't get new supplies until tmrw

## 2021-03-07 ENCOUNTER — Encounter (HOSPITAL_COMMUNITY)
Admission: RE | Admit: 2021-03-07 | Discharge: 2021-03-07 | Disposition: A | Discharge status: Home / Self Care | Payer: Medicare Other | Source: Ambulatory Visit | Attending: Urology | Admitting: Urology

## 2021-03-07 ENCOUNTER — Encounter (HOSPITAL_COMMUNITY): Payer: Self-pay | Admitting: Emergency Medicine

## 2021-03-07 ENCOUNTER — Telehealth: Payer: Self-pay

## 2021-03-07 ENCOUNTER — Encounter (HOSPITAL_COMMUNITY): Payer: Self-pay

## 2021-03-07 ENCOUNTER — Encounter (HOSPITAL_COMMUNITY): Payer: Self-pay | Admitting: Anesthesiology

## 2021-03-07 ENCOUNTER — Emergency Department (HOSPITAL_COMMUNITY)
Admission: EM | Admit: 2021-03-07 | Discharge: 2021-03-07 | Disposition: A | Discharge status: Home / Self Care | Payer: Medicare Other | Attending: Emergency Medicine | Admitting: Emergency Medicine

## 2021-03-07 ENCOUNTER — Other Ambulatory Visit: Payer: Self-pay

## 2021-03-07 DIAGNOSIS — N183 Chronic kidney disease, stage 3 unspecified: Secondary | ICD-10-CM | POA: Diagnosis not present

## 2021-03-07 DIAGNOSIS — M79604 Pain in right leg: Secondary | ICD-10-CM | POA: Insufficient documentation

## 2021-03-07 DIAGNOSIS — Z7901 Long term (current) use of anticoagulants: Secondary | ICD-10-CM | POA: Insufficient documentation

## 2021-03-07 DIAGNOSIS — Z433 Encounter for attention to colostomy: Secondary | ICD-10-CM | POA: Diagnosis not present

## 2021-03-07 DIAGNOSIS — Z87891 Personal history of nicotine dependence: Secondary | ICD-10-CM | POA: Insufficient documentation

## 2021-03-07 DIAGNOSIS — R609 Edema, unspecified: Secondary | ICD-10-CM | POA: Diagnosis not present

## 2021-03-07 DIAGNOSIS — Z7189 Other specified counseling: Secondary | ICD-10-CM

## 2021-03-07 HISTORY — DX: Personal history of urinary calculi: Z87.442

## 2021-03-07 HISTORY — DX: Acute embolism and thrombosis of unspecified deep veins of unspecified lower extremity: I82.409

## 2021-03-07 HISTORY — DX: Anemia, unspecified: D64.9

## 2021-03-07 HISTORY — DX: Chronic kidney disease, unspecified: N18.9

## 2021-03-07 NOTE — Telephone Encounter (Signed)
This CM spoke to Maple Hudson, FNP [facility] and explained patient's need for ostomy bags.  She said that he needs to be set up with a DME company to supply his ostomy bags. She has explained this to him and he has not followed through. He will have a copay for these supplies.    [name] said that she will reach out to him again to explain to him what he needs to do to register for ostomy supply delivery with DME company.

## 2021-03-07 NOTE — Telephone Encounter (Signed)
Patient presented to the clinic requesting ostomy bags. He said that he was told in the ED to come to PCP office.   Explained to him that this office only has a few ostomy pouches that were donated, no wafers.  He said that is not what he needed and stated  his order from [place] has not arrived.  Explained to him again that this clinic does not stock ostomy supplies. Urgent Care has no ostomy bags.   This CM spoke to Oletta Cohn, RN CM who confirmed that they have ostomy bags in ED but patient will need to check in.  Explained to patient that he will need to go to ED.
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
## 2021-03-07 NOTE — Anesthesia Preprocedure Evaluation (Deleted)
Anesthesia Evaluation    Airway        Dental   Pulmonary Patient abstained from smoking., former smoker,           Cardiovascular      Neuro/Psych    GI/Hepatic   Endo/Other    Renal/GU      Musculoskeletal   Abdominal   Peds  Hematology   Anesthesia Other Findings   Reproductive/Obstetrics                             Anesthesia Physical Anesthesia Plan  ASA:   Anesthesia Plan:    Post-op Pain Management:    Induction:   PONV Risk Score and Plan:   Airway Management Planned:   Additional Equipment:   Intra-op Plan:   Post-operative Plan:   Informed Consent:   Plan Discussed with:   Anesthesia Plan Comments: (See APP note by Joslyn Hy, FNP )        Anesthesia Quick Evaluation

## 2021-03-07 NOTE — ED Notes (Signed)
Pt ambulatory in triage. 

## 2021-03-08 ENCOUNTER — Encounter (HOSPITAL_COMMUNITY): Payer: Self-pay | Admitting: Nurse Practitioner

## 2021-03-08 ENCOUNTER — Telehealth: Payer: Self-pay

## 2021-03-08 NOTE — Telephone Encounter (Signed)
Message received from Maple Hudson, FNP  CWON:  She gave the patient  the direct number of the rep at [facility].  [facility] [phone])  All he  has to do is call them and they will auto ship ostomy supplies monthly.  [name] has signed the supply order.
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
## 2021-03-12 ENCOUNTER — Encounter (HOSPITAL_COMMUNITY): Payer: Self-pay | Admitting: Urology

## 2021-03-12 ENCOUNTER — Encounter (HOSPITAL_COMMUNITY): Admission: RE | Payer: Self-pay | Source: Home / Self Care

## 2021-03-12 ENCOUNTER — Ambulatory Visit (HOSPITAL_COMMUNITY): Admission: RE | Admit: 2021-03-12 | Payer: Medicare Other | Source: Home / Self Care | Admitting: Urology

## 2021-03-12 SURGERY — REMOVAL, STENT, URETER, CYSTOSCOPIC
Anesthesia: General | Laterality: Right

## 2021-03-14 ENCOUNTER — Telehealth: Payer: Self-pay | Admitting: Family Medicine

## 2021-03-14 NOTE — Telephone Encounter (Signed)
Patient is completely out of colostomy bag and would like to know if PCP has any in office, patient would like a call back today best # [phone]

## 2021-03-20 ENCOUNTER — Emergency Department (HOSPITAL_COMMUNITY)
Admission: EM | Admit: 2021-03-20 | Discharge: 2021-03-20 | Disposition: A | Discharge status: Home / Self Care | Payer: Medicare Other | Attending: Emergency Medicine | Admitting: Emergency Medicine

## 2021-03-20 ENCOUNTER — Encounter (HOSPITAL_COMMUNITY): Payer: Self-pay | Admitting: Emergency Medicine

## 2021-03-20 ENCOUNTER — Other Ambulatory Visit: Payer: Self-pay

## 2021-03-20 DIAGNOSIS — N183 Chronic kidney disease, stage 3 unspecified: Secondary | ICD-10-CM | POA: Insufficient documentation

## 2021-03-20 DIAGNOSIS — Z7901 Long term (current) use of anticoagulants: Secondary | ICD-10-CM | POA: Insufficient documentation

## 2021-03-20 DIAGNOSIS — Z87891 Personal history of nicotine dependence: Secondary | ICD-10-CM | POA: Diagnosis not present

## 2021-03-20 DIAGNOSIS — Z7189 Other specified counseling: Secondary | ICD-10-CM

## 2021-03-20 DIAGNOSIS — Z79899 Other long term (current) drug therapy: Secondary | ICD-10-CM | POA: Insufficient documentation

## 2021-03-20 DIAGNOSIS — Z433 Encounter for attention to colostomy: Secondary | ICD-10-CM | POA: Diagnosis not present

## 2021-03-20 DIAGNOSIS — K9403 Colostomy malfunction: Secondary | ICD-10-CM | POA: Insufficient documentation

## 2021-03-20 NOTE — ED Notes (Signed)
Patient discharge instructions reviewed with the patient. The patient verbalized understanding. Patient discharged. 

## 2021-03-20 NOTE — ED Provider Notes (Signed)
Emergency Medicine Provider Triage Evaluation Note  Raymond Bowen , a 45 y.o. male  was evaluated in triage.  Pt complains of needing a colostomy bag.  Review of Systems  Positive: Out of coloscopy supplies Negative: fever  Physical Exam  BP 113/68 (BP Location: Right Arm)   Pulse 100   Temp 98.2 F (36.8 C) (Oral)   Resp 18   SpO2 97%  Gen:   Awake, no distress   Resp:  Normal effort  MSK:   Moves extremities without difficulty  Other:    Medical Decision Making  Medically screening exam initiated at 4:12 PM.  Appropriate orders placed.  Raymond Bowen was informed that the remainder of the evaluation will be completed by another provider, this initial triage assessment does not replace that evaluation, and the importance of remaining in the ED until their evaluation is complete.     Raymond Bowen 03/20/21 1613    Raymond Savoy, MD 03/20/21 [phone]

## 2021-03-20 NOTE — Telephone Encounter (Signed)
Returned pt call and made aware that we don't have any colostomy bags

## 2021-03-20 NOTE — Discharge Instructions (Addendum)
Please reschedule your cystoscopy with your provider.  Continue to get your supplies delivered.  If things change or worsen return back to the ED as needed.
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
## 2021-03-20 NOTE — ED Notes (Signed)
Called multiple times for triage and unable to locate in lobby.

## 2021-03-20 NOTE — ED Triage Notes (Signed)
Pt requesting colostomy supplies. States his were not delivered.

## 2021-03-29 ENCOUNTER — Encounter: Payer: Self-pay | Admitting: Family Medicine

## 2021-03-29 ENCOUNTER — Other Ambulatory Visit: Payer: Self-pay

## 2021-03-29 ENCOUNTER — Telehealth: Payer: Self-pay

## 2021-03-29 ENCOUNTER — Ambulatory Visit: Payer: Medicaid Other | Attending: Family Medicine | Admitting: Family Medicine

## 2021-03-29 VITALS — BP 98/62 | HR 93 | Ht 67.0 in | Wt 176.0 lb

## 2021-03-29 DIAGNOSIS — I872 Venous insufficiency (chronic) (peripheral): Secondary | ICD-10-CM | POA: Diagnosis not present

## 2021-03-29 DIAGNOSIS — Z932 Ileostomy status: Secondary | ICD-10-CM | POA: Diagnosis not present

## 2021-03-29 DIAGNOSIS — I82403 Acute embolism and thrombosis of unspecified deep veins of lower extremity, bilateral: Secondary | ICD-10-CM | POA: Diagnosis not present

## 2021-03-29 DIAGNOSIS — Z86718 Personal history of other venous thrombosis and embolism: Secondary | ICD-10-CM | POA: Insufficient documentation

## 2021-03-29 DIAGNOSIS — G629 Polyneuropathy, unspecified: Secondary | ICD-10-CM | POA: Diagnosis not present

## 2021-03-29 DIAGNOSIS — M792 Neuralgia and neuritis, unspecified: Secondary | ICD-10-CM

## 2021-03-29 DIAGNOSIS — Z7901 Long term (current) use of anticoagulants: Secondary | ICD-10-CM | POA: Diagnosis not present

## 2021-03-29 DIAGNOSIS — Z933 Colostomy status: Secondary | ICD-10-CM | POA: Diagnosis not present

## 2021-03-29 DIAGNOSIS — M21371 Foot drop, right foot: Secondary | ICD-10-CM | POA: Diagnosis not present

## 2021-03-29 DIAGNOSIS — Z09 Encounter for follow-up examination after completed treatment for conditions other than malignant neoplasm: Secondary | ICD-10-CM | POA: Insufficient documentation

## 2021-03-29 DIAGNOSIS — N189 Chronic kidney disease, unspecified: Secondary | ICD-10-CM | POA: Diagnosis not present

## 2021-03-29 DIAGNOSIS — Z79899 Other long term (current) drug therapy: Secondary | ICD-10-CM | POA: Insufficient documentation

## 2021-03-29 DIAGNOSIS — M7989 Other specified soft tissue disorders: Secondary | ICD-10-CM | POA: Diagnosis not present

## 2021-03-29 DIAGNOSIS — Z881 Allergy status to other antibiotic agents status: Secondary | ICD-10-CM | POA: Insufficient documentation

## 2021-03-29 MED ORDER — FUROSEMIDE 20 MG PO TABS
20.0000 mg | ORAL_TABLET | Freq: Every day | ORAL | 3 refills | Status: DC
  Filled 2021-03-29: qty 30, 30d supply, fill #0

## 2021-03-29 MED ORDER — PREGABALIN 100 MG PO CAPS
100.0000 mg | ORAL_CAPSULE | Freq: Two times a day (BID) | ORAL | 3 refills | Status: DC
  Filled 2021-03-29: qty 60, 30d supply, fill #0

## 2021-03-29 MED ORDER — DULOXETINE HCL 60 MG PO CPEP
60.0000 mg | ORAL_CAPSULE | Freq: Every day | ORAL | 3 refills | Status: DC
  Filled 2021-03-29: qty 30, 30d supply, fill #0

## 2021-03-29 MED ORDER — METHOCARBAMOL 500 MG PO TABS
500.0000 mg | ORAL_TABLET | Freq: Three times a day (TID) | ORAL | 3 refills | Status: DC | PRN
  Filled 2021-03-29: qty 66, 22d supply, fill #0

## 2021-03-29 MED ORDER — RIVAROXABAN 20 MG PO TABS
20.0000 mg | ORAL_TABLET | Freq: Every day | ORAL | 3 refills | Status: DC
  Filled 2021-03-29: qty 30, 30d supply, fill #0

## 2021-03-29 NOTE — Telephone Encounter (Signed)
Met with the patient when he was in the clinic today. He is out of ostomy supplies and said he never spoke to anyone at [facility] about having supplies delivered to him. He stated that he didn't know about [facility] and did not have their phone number.   Discussed supply delivery. He does not have a permanent address and is willing to have the supplies sent to [facility].  We will call him when the shipment arrives. Explained to him again about the importance of receiving these supplies because he should not have to go to the ED for ostomy supplies.  Call placed to Karen Sanders, FNP [facility] and informed her that the patient has not received any supplies yet.  He will call while here in the clinic and we will have supplies delivered to [facility]. Regarding his immediate need for supplies, she stated that she would leave 3 pouch sets at the front desk at [facility] today for him to pick up.   Informed patient of supplies that Karen is leaving for him to pick up at the hospital and he said he understood.  With patient, call placed to [facility] # [phone], left message on managed care services voicemail requesting a call back to this CM and/or patient.  This CM then called [facility] at number noted above and spoke to Wendy at the Managed [facility]swering Service.  She said that [facility] is no longer working there. Regarding the order, she said they are waiting for a signed order from the provider. She said that they had received information from Karen Sanders, FNP but they still need a signed order faxed to # [phone].  Informed Wendy that the patient would like the supplies delivered to [facility] and provided her with the clinic address and contact numbers for this CM.   Voicemail message left for Karen Sanders # [phone] regarding need for signed order     

## 2021-03-29 NOTE — Patient Instructions (Signed)
Edema  Edema is when you have too much fluid in your body or under your skin. Edema may make your legs, feet, and ankles swell up. Swelling is also common in looser tissues, like around your eyes. This is a common condition. It gets more common as you get older. There are many possible causes of edema. Eating too much salt (sodium) and being on your feet or sitting for a long time can cause edema in yourlegs, feet, and ankles. Hot weather may make edema worse. Edema is usually painless. Your skin may look swollen or shiny. Follow these instructions at home: Keep the swollen body part raised (elevated) above the level of your heart when you are sitting or lying down. Do not sit still or stand for a long time. Do not wear tight clothes. Do not wear garters on your upper legs. Exercise your legs. This can help the swelling go down. Wear elastic bandages or support stockings as told by your doctor. Eat a low-salt (low-sodium) diet to reduce fluid as told by your doctor. Depending on the cause of your swelling, you may need to limit how much fluid you drink (fluid restriction). Take over-the-counter and prescription medicines only as told by your doctor. Contact a doctor if: Treatment is not working. You have heart, liver, or kidney disease and have symptoms of edema. You have sudden and unexplained weight gain. Get help right away if: You have shortness of breath or chest pain. You cannot breathe when you lie down. You have pain, redness, or warmth in the swollen areas. You have heart, liver, or kidney disease and get edema all of a sudden. You have a fever and your symptoms get worse all of a sudden. Summary Edema is when you have too much fluid in your body or under your skin. Edema may make your legs, feet, and ankles swell up. Swelling is also common in looser tissues, like around your eyes. Raise (elevate) the swollen body part above the level of your heart when you are sitting or lying  down. Follow your doctor's instructions about diet and how much fluid you can drink (fluid restriction). This information is not intended to replace advice given to you by your health care provider. Make sure you discuss any questions you have with your healthcare provider. Document Revised: 05/03/2020 Document Reviewed: 05/03/2020 Elsevier Patient Education  2022 [employer]  
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
## 2021-03-29 NOTE — Progress Notes (Signed)
Having pain in foot and legs.

## 2021-03-30 ENCOUNTER — Encounter (HOSPITAL_COMMUNITY): Payer: Self-pay

## 2021-03-30 ENCOUNTER — Other Ambulatory Visit (HOSPITAL_COMMUNITY): Payer: Self-pay | Admitting: Nurse Practitioner

## 2021-03-30 ENCOUNTER — Observation Stay (HOSPITAL_COMMUNITY)
Admission: EM | Admit: 2021-03-30 | Discharge: 2021-03-31 | Disposition: A | Discharge status: Home / Self Care | Payer: Medicare Other | Attending: Internal Medicine | Admitting: Internal Medicine

## 2021-03-30 ENCOUNTER — Emergency Department (HOSPITAL_COMMUNITY): Payer: Medicare Other

## 2021-03-30 DIAGNOSIS — N1832 Chronic kidney disease, stage 3b: Secondary | ICD-10-CM

## 2021-03-30 DIAGNOSIS — Z5321 Procedure and treatment not carried out due to patient leaving prior to being seen by health care provider: Secondary | ICD-10-CM | POA: Insufficient documentation

## 2021-03-30 DIAGNOSIS — N201 Calculus of ureter: Secondary | ICD-10-CM | POA: Diagnosis present

## 2021-03-30 DIAGNOSIS — N390 Urinary tract infection, site not specified: Secondary | ICD-10-CM | POA: Diagnosis not present

## 2021-03-30 DIAGNOSIS — F1910 Other psychoactive substance abuse, uncomplicated: Secondary | ICD-10-CM

## 2021-03-30 DIAGNOSIS — Z79899 Other long term (current) drug therapy: Secondary | ICD-10-CM | POA: Insufficient documentation

## 2021-03-30 DIAGNOSIS — Z7901 Long term (current) use of anticoagulants: Secondary | ICD-10-CM | POA: Diagnosis not present

## 2021-03-30 DIAGNOSIS — Z932 Ileostomy status: Secondary | ICD-10-CM

## 2021-03-30 DIAGNOSIS — Z9114 Patient's other noncompliance with medication regimen: Secondary | ICD-10-CM | POA: Insufficient documentation

## 2021-03-30 DIAGNOSIS — R110 Nausea: Secondary | ICD-10-CM | POA: Diagnosis not present

## 2021-03-30 DIAGNOSIS — N1330 Unspecified hydronephrosis: Secondary | ICD-10-CM

## 2021-03-30 DIAGNOSIS — N200 Calculus of kidney: Secondary | ICD-10-CM | POA: Diagnosis not present

## 2021-03-30 DIAGNOSIS — E872 Acidosis, unspecified: Secondary | ICD-10-CM | POA: Diagnosis present

## 2021-03-30 DIAGNOSIS — N3001 Acute cystitis with hematuria: Secondary | ICD-10-CM

## 2021-03-30 DIAGNOSIS — Z86718 Personal history of other venous thrombosis and embolism: Secondary | ICD-10-CM

## 2021-03-30 DIAGNOSIS — R112 Nausea with vomiting, unspecified: Secondary | ICD-10-CM | POA: Diagnosis not present

## 2021-03-30 DIAGNOSIS — R109 Unspecified abdominal pain: Secondary | ICD-10-CM | POA: Diagnosis not present

## 2021-03-30 DIAGNOSIS — R1084 Generalized abdominal pain: Secondary | ICD-10-CM | POA: Diagnosis not present

## 2021-03-30 DIAGNOSIS — K9400 Colostomy complication, unspecified: Secondary | ICD-10-CM

## 2021-03-30 DIAGNOSIS — N132 Hydronephrosis with renal and ureteral calculous obstruction: Secondary | ICD-10-CM | POA: Diagnosis not present

## 2021-03-30 DIAGNOSIS — Z87891 Personal history of nicotine dependence: Secondary | ICD-10-CM | POA: Diagnosis not present

## 2021-03-30 LAB — URINALYSIS, ROUTINE W REFLEX MICROSCOPIC
Glucose, UA: NEGATIVE mg/dL
Ketones, ur: 15 mg/dL — AB
Nitrite: POSITIVE — AB
Protein, ur: >300 mg/dL — AB
Specific Gravity, Urine: >1.03 — ABNORMAL HIGH (ref 1.005–1.030)
pH: 6.5 (ref 5.0–8.0)

## 2021-03-30 LAB — BASIC METABOLIC PANEL
Anion gap: 10 (ref 5–15)
BUN: 33 mg/dL — ABNORMAL HIGH (ref 6–20)
CO2: 18 mmol/L — ABNORMAL LOW (ref 22–32)
Calcium: 9.5 mg/dL (ref 8.9–10.3)
Chloride: 108 mmol/L (ref 98–111)
Creatinine, Ser: 1.76 mg/dL — ABNORMAL HIGH (ref 0.61–1.24)
GFR, Estimated: 49 mL/min — ABNORMAL LOW (ref >60)
Glucose, Bld: 99 mg/dL (ref 70–99)
Potassium: 3.6 mmol/L (ref 3.5–5.1)
Sodium: 136 mmol/L (ref 135–145)

## 2021-03-30 LAB — HEPATIC FUNCTION PANEL
ALT: 29 U/L (ref 0–44)
AST: 28 U/L (ref 15–41)
Albumin: 3.9 g/dL (ref 3.5–5.0)
Alkaline Phosphatase: 107 U/L (ref 38–126)
Bilirubin, Direct: 0.1 mg/dL (ref 0.0–0.2)
Indirect Bilirubin: 0.6 mg/dL (ref 0.3–0.9)
Total Bilirubin: 0.7 mg/dL (ref 0.3–1.2)
Total Protein: 8.1 g/dL (ref 6.5–8.1)

## 2021-03-30 LAB — CBC WITH DIFFERENTIAL/PLATELET
Abs Immature Granulocytes: 0.06 10*3/uL (ref 0.00–0.07)
Basophils Absolute: 0 10*3/uL (ref 0.0–0.1)
Basophils Relative: 0 %
Eosinophils Absolute: 0.1 10*3/uL (ref 0.0–0.5)
Eosinophils Relative: 0 %
HCT: 37.6 % — ABNORMAL LOW (ref 39.0–52.0)
Hemoglobin: 11.5 g/dL — ABNORMAL LOW (ref 13.0–17.0)
Immature Granulocytes: 0 %
Lymphocytes Relative: 8 %
Lymphs Abs: 1.3 10*3/uL (ref 0.7–4.0)
MCH: 28.3 pg (ref 26.0–34.0)
MCHC: 30.6 g/dL (ref 30.0–36.0)
MCV: 92.6 fL (ref 80.0–100.0)
Monocytes Absolute: 1.3 10*3/uL — ABNORMAL HIGH (ref 0.1–1.0)
Monocytes Relative: 8 %
Neutro Abs: 12.5 10*3/uL — ABNORMAL HIGH (ref 1.7–7.7)
Neutrophils Relative %: 84 %
Platelets: 257 10*3/uL (ref 150–400)
RBC: 4.06 MIL/uL — ABNORMAL LOW (ref 4.22–5.81)
RDW: 13.4 % (ref 11.5–15.5)
WBC: 15.2 10*3/uL — ABNORMAL HIGH (ref 4.0–10.5)
nRBC: 0 % (ref 0.0–0.2)

## 2021-03-30 LAB — URINALYSIS, MICROSCOPIC (REFLEX): RBC / HPF: >50 RBC/hpf (ref 0–5)

## 2021-03-30 LAB — LIPASE, BLOOD: Lipase: 37 U/L (ref 11–51)

## 2021-03-30 LAB — LACTIC ACID, PLASMA: Lactic Acid, Venous: 2 mmol/L (ref 0.5–1.9)

## 2021-03-30 MED ORDER — HYDROMORPHONE HCL 1 MG/ML IJ SOLN
1.0000 mg | Freq: Once | INTRAMUSCULAR | Status: AC
  Administered 2021-03-30: 1 mg via INTRAVENOUS
  Filled 2021-03-30: qty 1

## 2021-03-30 MED ORDER — ONDANSETRON 4 MG PO TBDP
4.0000 mg | ORAL_TABLET | Freq: Once | ORAL | Status: DC
  Filled 2021-03-30: qty 1

## 2021-03-30 MED ORDER — HYDROMORPHONE HCL 1 MG/ML IJ SOLN
1.0000 mg | Freq: Once | INTRAMUSCULAR | Status: AC
Start: 2021-03-30 — End: 2021-03-30
  Administered 2021-03-30: 1 mg via INTRAVENOUS
  Filled 2021-03-30: qty 1

## 2021-03-30 MED ORDER — LACTATED RINGERS IV BOLUS
500.0000 mL | Freq: Once | INTRAVENOUS | Status: AC
  Administered 2021-03-30: 500 mL via INTRAVENOUS

## 2021-03-30 MED ORDER — LACTATED RINGERS IV SOLN: INTRAVENOUS | Status: DC

## 2021-03-30 MED ORDER — OXYCODONE-ACETAMINOPHEN 5-325 MG PO TABS
1.0000 | ORAL_TABLET | Freq: Once | ORAL | Status: DC
Start: 2021-03-30 — End: 2021-03-31

## 2021-03-30 MED ORDER — ONDANSETRON HCL 4 MG/2ML IJ SOLN
4.0000 mg | Freq: Once | INTRAMUSCULAR | Status: AC
  Administered 2021-03-30: 4 mg via INTRAVENOUS
  Filled 2021-03-30: qty 2

## 2021-03-30 MED ORDER — SODIUM CHLORIDE 0.9 % IV SOLN
1.0000 g | Freq: Once | INTRAVENOUS | Status: AC
  Administered 2021-03-30: 1 g via INTRAVENOUS
  Filled 2021-03-30: qty 10
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
## 2021-03-30 NOTE — Telephone Encounter (Signed)
New order faxed and sent again for ostomy supplies.  2 3/4" pouches (2 piece) with barrier ring. Due to pain at peristomal site, I am sending powder and skin prep as well so patient can crust his skin to protect.
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
## 2021-03-30 NOTE — ED Triage Notes (Signed)
Pt arrived via EMS, left sided flank pain since this morning. Hx of kidney stones.   Given 50 mcg fentanyl intranasal

## 2021-03-31 ENCOUNTER — Observation Stay (HOSPITAL_COMMUNITY): Payer: Medicare Other | Admitting: Certified Registered Nurse Anesthetist

## 2021-03-31 ENCOUNTER — Other Ambulatory Visit: Payer: Self-pay

## 2021-03-31 ENCOUNTER — Observation Stay (HOSPITAL_COMMUNITY): Payer: Medicare Other

## 2021-03-31 ENCOUNTER — Encounter (HOSPITAL_COMMUNITY): Payer: Self-pay | Admitting: Internal Medicine

## 2021-03-31 ENCOUNTER — Encounter (HOSPITAL_COMMUNITY)
Admission: EM | Disposition: A | Discharge status: Home / Self Care | Payer: Self-pay | Source: Home / Self Care | Attending: Emergency Medicine

## 2021-03-31 DIAGNOSIS — N390 Urinary tract infection, site not specified: Secondary | ICD-10-CM

## 2021-03-31 DIAGNOSIS — N1832 Chronic kidney disease, stage 3b: Secondary | ICD-10-CM | POA: Diagnosis not present

## 2021-03-31 DIAGNOSIS — F1910 Other psychoactive substance abuse, uncomplicated: Secondary | ICD-10-CM | POA: Diagnosis present

## 2021-03-31 DIAGNOSIS — E872 Acidosis, unspecified: Secondary | ICD-10-CM | POA: Diagnosis present

## 2021-03-31 DIAGNOSIS — N200 Calculus of kidney: Secondary | ICD-10-CM | POA: Diagnosis not present

## 2021-03-31 DIAGNOSIS — N132 Hydronephrosis with renal and ureteral calculous obstruction: Secondary | ICD-10-CM | POA: Diagnosis not present

## 2021-03-31 DIAGNOSIS — Z87891 Personal history of nicotine dependence: Secondary | ICD-10-CM | POA: Diagnosis not present

## 2021-03-31 DIAGNOSIS — Z86718 Personal history of other venous thrombosis and embolism: Secondary | ICD-10-CM

## 2021-03-31 HISTORY — PX: CYSTOSCOPY/URETEROSCOPY/HOLMIUM LASER/STENT PLACEMENT: SHX6546

## 2021-03-31 LAB — RAPID URINE DRUG SCREEN, HOSP PERFORMED
Amphetamines: NOT DETECTED
Barbiturates: NOT DETECTED
Benzodiazepines: NOT DETECTED
Cocaine: POSITIVE — AB
Opiates: NOT DETECTED
Tetrahydrocannabinol: NOT DETECTED

## 2021-03-31 LAB — LACTIC ACID, PLASMA: Lactic Acid, Venous: 0.9 mmol/L (ref 0.5–1.9)

## 2021-03-31 SURGERY — CYSTOSCOPY/URETEROSCOPY/HOLMIUM LASER/STENT PLACEMENT
Anesthesia: General | Site: Ureter | Laterality: Left

## 2021-03-31 MED ORDER — LORAZEPAM 1 MG PO TABS: 1.0000 mg | ORAL_TABLET | ORAL | Status: DC | PRN

## 2021-03-31 MED ORDER — ONDANSETRON HCL 4 MG PO TABS: 4.0000 mg | ORAL_TABLET | Freq: Four times a day (QID) | ORAL | Status: DC | PRN

## 2021-03-31 MED ORDER — HYDROMORPHONE HCL 1 MG/ML IJ SOLN
1.0000 mg | INTRAMUSCULAR | Status: DC | PRN
  Administered 2021-03-31 (×3): 1 mg via INTRAVENOUS
  Filled 2021-03-31 (×3): qty 1

## 2021-03-31 MED ORDER — DEXAMETHASONE SODIUM PHOSPHATE 10 MG/ML IJ SOLN
INTRAMUSCULAR | Status: DC | PRN
  Administered 2021-03-31: 10 mg via INTRAVENOUS

## 2021-03-31 MED ORDER — DEXAMETHASONE SODIUM PHOSPHATE 10 MG/ML IJ SOLN
INTRAMUSCULAR | Status: AC
  Filled 2021-03-31: qty 1

## 2021-03-31 MED ORDER — HYDROMORPHONE HCL 1 MG/ML IJ SOLN
INTRAMUSCULAR | Status: AC
  Filled 2021-03-31: qty 1

## 2021-03-31 MED ORDER — MELATONIN 5 MG PO TABS: 10.0000 mg | ORAL_TABLET | Freq: Every evening | ORAL | Status: DC | PRN

## 2021-03-31 MED ORDER — SODIUM CHLORIDE 0.9 % IV SOLN
1.0000 g | INTRAVENOUS | Status: DC
  Filled 2021-03-31: qty 10

## 2021-03-31 MED ORDER — IOHEXOL 300 MG/ML  SOLN
INTRAMUSCULAR | Status: DC | PRN
  Administered 2021-03-31: 10 mL

## 2021-03-31 MED ORDER — HYDROMORPHONE HCL 1 MG/ML IJ SOLN
1.0000 mg | Freq: Once | INTRAMUSCULAR | Status: AC
  Administered 2021-03-31: 1 mg via INTRAVENOUS
  Filled 2021-03-31: qty 1

## 2021-03-31 MED ORDER — HYDROMORPHONE HCL 1 MG/ML IJ SOLN: 0.2500 mg | INTRAMUSCULAR | Status: DC | PRN

## 2021-03-31 MED ORDER — ACETAMINOPHEN 650 MG RE SUPP: 650.0000 mg | Freq: Four times a day (QID) | RECTAL | Status: DC | PRN

## 2021-03-31 MED ORDER — FENTANYL CITRATE (PF) 250 MCG/5ML IJ SOLN
INTRAMUSCULAR | Status: AC
  Filled 2021-03-31: qty 5

## 2021-03-31 MED ORDER — THIAMINE HCL 100 MG PO TABS: 100.0000 mg | ORAL_TABLET | Freq: Every day | ORAL | Status: DC

## 2021-03-31 MED ORDER — LIDOCAINE 2% (20 MG/ML) 5 ML SYRINGE
INTRAMUSCULAR | Status: DC | PRN
  Administered 2021-03-31: 60 mg via INTRAVENOUS

## 2021-03-31 MED ORDER — ONDANSETRON HCL 4 MG/2ML IJ SOLN
INTRAMUSCULAR | Status: AC
  Filled 2021-03-31: qty 2

## 2021-03-31 MED ORDER — PROPOFOL 10 MG/ML IV BOLUS
INTRAVENOUS | Status: DC | PRN
  Administered 2021-03-31: 200 mg via INTRAVENOUS

## 2021-03-31 MED ORDER — DULOXETINE HCL 60 MG PO CPEP: 60.0000 mg | ORAL_CAPSULE | Freq: Every day | ORAL | Status: DC

## 2021-03-31 MED ORDER — LIDOCAINE 2% (20 MG/ML) 5 ML SYRINGE
INTRAMUSCULAR | Status: AC
  Filled 2021-03-31: qty 5

## 2021-03-31 MED ORDER — MIDAZOLAM HCL 2 MG/2ML IJ SOLN
INTRAMUSCULAR | Status: AC
  Filled 2021-03-31: qty 2

## 2021-03-31 MED ORDER — THIAMINE HCL 100 MG/ML IJ SOLN: 100.0000 mg | Freq: Every day | INTRAMUSCULAR | Status: DC

## 2021-03-31 MED ORDER — ONDANSETRON HCL 4 MG/2ML IJ SOLN: 4.0000 mg | Freq: Four times a day (QID) | INTRAMUSCULAR | Status: DC | PRN

## 2021-03-31 MED ORDER — LORAZEPAM 2 MG/ML IJ SOLN: 1.0000 mg | INTRAMUSCULAR | Status: DC | PRN

## 2021-03-31 MED ORDER — PREGABALIN 100 MG PO CAPS: 100.0000 mg | ORAL_CAPSULE | Freq: Two times a day (BID) | ORAL | Status: DC

## 2021-03-31 MED ORDER — HYDROMORPHONE HCL 1 MG/ML IJ SOLN: 0.5000 mg | INTRAMUSCULAR | Status: DC | PRN

## 2021-03-31 MED ORDER — FOLIC ACID 1 MG PO TABS: 1.0000 mg | ORAL_TABLET | Freq: Every day | ORAL | Status: DC

## 2021-03-31 MED ORDER — FENTANYL CITRATE (PF) 100 MCG/2ML IJ SOLN
INTRAMUSCULAR | Status: DC | PRN
  Administered 2021-03-31: 25 ug via INTRAVENOUS
  Administered 2021-03-31 (×2): 50 ug via INTRAVENOUS

## 2021-03-31 MED ORDER — PHENYLEPHRINE 40 MCG/ML (10ML) SYRINGE FOR IV PUSH (FOR BLOOD PRESSURE SUPPORT)
PREFILLED_SYRINGE | INTRAVENOUS | Status: DC | PRN
  Administered 2021-03-31 (×2): 120 ug via INTRAVENOUS

## 2021-03-31 MED ORDER — MIDAZOLAM HCL 5 MG/5ML IJ SOLN
INTRAMUSCULAR | Status: DC | PRN
  Administered 2021-03-31: 2 mg via INTRAVENOUS

## 2021-03-31 MED ORDER — ADULT MULTIVITAMIN W/MINERALS CH: 1.0000 | ORAL_TABLET | Freq: Every day | ORAL | Status: DC

## 2021-03-31 MED ORDER — OXYCODONE HCL 5 MG/5ML PO SOLN
5.0000 mg | Freq: Once | ORAL | Status: DC | PRN
Start: 2021-03-31 — End: 2021-03-31

## 2021-03-31 MED ORDER — HYDROMORPHONE HCL 1 MG/ML IJ SOLN
1.0000 mg | INTRAMUSCULAR | Status: DC | PRN
  Administered 2021-03-31: 1 mg via INTRAVENOUS
  Filled 2021-03-31 (×2): qty 1

## 2021-03-31 MED ORDER — PROMETHAZINE HCL 25 MG/ML IJ SOLN: 6.2500 mg | INTRAMUSCULAR | Status: DC | PRN

## 2021-03-31 MED ORDER — POLYETHYLENE GLYCOL 3350 17 G PO PACK: 17.0000 g | PACK | Freq: Every day | ORAL | Status: DC | PRN

## 2021-03-31 MED ORDER — PROPOFOL 10 MG/ML IV BOLUS
INTRAVENOUS | Status: AC
  Filled 2021-03-31: qty 20

## 2021-03-31 MED ORDER — AMISULPRIDE (ANTIEMETIC) 5 MG/2ML IV SOLN: 10.0000 mg | Freq: Once | INTRAVENOUS | Status: DC | PRN

## 2021-03-31 MED ORDER — ONDANSETRON HCL 4 MG/2ML IJ SOLN
INTRAMUSCULAR | Status: DC | PRN
  Administered 2021-03-31: 4 mg via INTRAVENOUS

## 2021-03-31 MED ORDER — SODIUM CHLORIDE 0.9 % IR SOLN
Status: DC | PRN
  Administered 2021-03-31: 3000 mL via INTRAVESICAL

## 2021-03-31 MED ORDER — OXYCODONE HCL 5 MG PO TABS
5.0000 mg | ORAL_TABLET | Freq: Once | ORAL | Status: DC | PRN
Start: 2021-03-31 — End: 2021-03-31

## 2021-03-31 MED ORDER — PHENYLEPHRINE 40 MCG/ML (10ML) SYRINGE FOR IV PUSH (FOR BLOOD PRESSURE SUPPORT)
PREFILLED_SYRINGE | INTRAVENOUS | Status: AC
  Filled 2021-03-31: qty 10

## 2021-03-31 MED ORDER — ACETAMINOPHEN 325 MG PO TABS: 650.0000 mg | ORAL_TABLET | Freq: Four times a day (QID) | ORAL | Status: DC | PRN

## 2021-03-31 SURGICAL SUPPLY — 23 items
BAG URO CATCHER STRL LF (MISCELLANEOUS) ×2 IMPLANT
BASKET ZERO TIP NITINOL 2.4FR (BASKET) IMPLANT
BSKT STON RTRVL ZERO TP 2.4FR (BASKET)
BULB IRRIG PATHFIND (MISCELLANEOUS) ×2 IMPLANT
CATH URET 5FR 28IN OPEN ENDED (CATHETERS) IMPLANT
CLOTH BEACON ORANGE TIMEOUT ST (SAFETY) ×2 IMPLANT
GLOVE SURG ENC TEXT LTX SZ7.5 (GLOVE) ×2 IMPLANT
GOWN STRL REUS W/TWL XL LVL3 (GOWN DISPOSABLE) ×2 IMPLANT
GUIDEWIRE ANG ZIPWIRE 038X150 (WIRE) IMPLANT
GUIDEWIRE STR DUAL SENSOR (WIRE) ×2 IMPLANT
IV NS 1000ML (IV SOLUTION) ×2
IV NS 1000ML BAXH (IV SOLUTION) ×1 IMPLANT
KIT TURNOVER KIT A (KITS) ×2 IMPLANT
LASER FIB FLEXIVA PULSE ID 365 (Laser) IMPLANT
MANIFOLD NEPTUNE II (INSTRUMENTS) ×2 IMPLANT
PACK CYSTO (CUSTOM PROCEDURE TRAY) ×2 IMPLANT
SHEATH URETERAL 12FRX35CM (MISCELLANEOUS) IMPLANT
STENT URET 6FRX26 CONTOUR (STENTS) ×1 IMPLANT
SYR 20ML LL LF (SYRINGE) ×2 IMPLANT
TRACTIP FLEXIVA PULS ID 200XHI (Laser) IMPLANT
TRACTIP FLEXIVA PULSE ID 200 (Laser)
TUBING CONNECTING 10 (TUBING) ×2 IMPLANT
TUBING UROLOGY SET (TUBING) ×2 IMPLANT
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
## 2021-03-31 NOTE — Progress Notes (Signed)
Pt verbalized " I need to go home, my girlfriend cannot pick me up tomorrow.I don't have a ride." Pt is very insistent that he needs to leave. It was explained that he needs to get his IV ABX  and  risk of him leaving against medical advise.  Again he verbalized that " I need to take care of my son, I don't want social services to be involve ,they are close on sundays,  I can't I need to go home". I spoke with Urology MD, and highly recommends that pt should stay for IV ABX and stent removal. Pt was able to talked with Urology MD on the phone and pt stated I cannot understand, I need to go".
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
## 2021-03-31 NOTE — Op Note (Addendum)
Preoperative diagnosis:  1.  Left proximal ureteral calculus 2.  Right prolonged retained ureteral stent  Postoperative diagnosis: 1.  Left proximal ureteral calculus 2.  Prolonged retained right ureteral stent Procedure(s): 1.  Cystoscopy, left retrograde pyelogram with intraoperative interpretation, insertion left JJ stent.  Attempted removal of right JJ stent (unsuccessful)  Surgeon: Dr. Karoline Caldwell  Anesthesia: General  Complications: None  EBL: Minimal  Specimens: None  Disposition of specimens: Not applicable  Intraoperative findings: 5 mm left proximal ureteral calculus.  Moderate hydronephrosis noted.  6 [place] by 26 cm Percuflex plus soft contour stent placed with hydronephrotic flow of some cloudy urine.  Right JJ stent markedly calcified.  Unable to remove stent due to attachment to the ureter with encrustation  Indication: 45 year old African-American male prior history of stone extraction in April 2021 with indwelling stent placed.  He failed to follow-up as has indwelling right JJ stent prolonged.  Also has left 5 to 6 mm proximal ureteral calculus with acute pain and colic.  Presents this time undergo cystoscopy insertion left JJ stent attempt at removal of right JJ stent  Description of procedure:  After obtaining informed consent of the patient is taken the major cystoscopy suite placed under general anesthesia.  Placed in dorsolithotomy position genitalia prepped and draped in usual sterile fashion.  Proper pause and timeout was performed.  21 [place] was advanced in the bladder without difficulty.  The right indwelling JJ stent was noted emanating from the right ureteral orifice was completely encrusted outside the ureteral orifice.  Left ureteral orifice was identified with minimal E flux of urine.  5 French open tip cath was utilized to cannulate the left ureteral orifice and retrograde pyelogram confirmed proximal filling defect consistent with a stone seen on  preoperative CT scan.  Sensor wire was passed through the open tip catheter up to the renal pelvis where it was coiled.  The operative catheter was removed.  A 6 French by 26 cm Percuflex plus soft Contour stent was placed leaving a proximal coil in the renal pelvis and a distal coil in the bladder.  There was flow of somewhat cloudy urine through and around the stent noted.  Attention then directed towards attempt removal of the right indwelling stent.  I utilized Special educational needs teacher and was able to gather purchase of the stent.  I attempted gentle pulling and initially felt like the stent might be loose enough to remove however there became significant resistance to pulling the stent and therefore procedure was aborted for fear of avulsing the ureteral mucosa with further pulling.  The bladder was emptied and the procedure was terminated.  He was awakened from anesthesia and taken back to the recovery room stable condition.  No immediate complication from the procedure.  The patient is ultimately going to need attempt at ureteroscopy alongside the stent with possible laser to see if we can get the stent free of encrustation.  We will also need definitive ureteroscopy for removal of the left ureteral calculus as well.

## 2021-03-31 NOTE — Anesthesia Procedure Notes (Signed)
Procedure Name: LMA Insertion Date/Time: 03/31/2021 9:40 AM Performed by: Vanessa [place], CRNA Pre-anesthesia Checklist: Emergency Drugs available, Patient identified, Suction available and Patient being monitored Patient Re-evaluated:Patient Re-evaluated prior to induction Oxygen Delivery Method: Circle system utilized Preoxygenation: Pre-oxygenation with 100% oxygen Induction Type: IV induction Ventilation: Mask ventilation without difficulty LMA: LMA with gastric port inserted LMA Size: 4.0 Number of attempts: 1 Placement Confirmation: positive ETCO2 and breath sounds checked- equal and bilateral Tube secured with: Tape Dental Injury: Teeth and Oropharynx as per pre-operative assessment

## 2021-03-31 NOTE — Progress Notes (Signed)
Pt refusing a full skin assessment on admission r/t pain. Interventions implemented for pain management. Will continue to assess patient for appropriate time to assess skin.

## 2021-03-31 NOTE — Transfer of Care (Signed)
Immediate Anesthesia Transfer of Care Note  Patient: Raymond Bowen  Procedure(s) Performed: CYSTOSCOPY; RETROGRADE PYELOGRAM LEFT URETERAL STENT PLACEMENT (Left: Ureter)  Patient Location: PACU  Anesthesia Type:General  Level of Consciousness: awake and patient cooperative  Airway & Oxygen Therapy: Patient Spontanous Breathing and Patient connected to face mask  Post-op Assessment: Report given to RN and Post -op Vital signs reviewed and stable  Post vital signs: Reviewed and stable  Last Vitals:  Vitals Value Taken Time  BP 114/78 03/31/21 1015  Temp    Pulse 76 03/31/21 1018  Resp    SpO2 100 % 03/31/21 1018  Vitals shown include unvalidated device data.  Last Pain:  Vitals:   03/31/21 0832  TempSrc:   PainSc: 9          Complications: No notable events documented.
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
## 2021-03-31 NOTE — Progress Notes (Signed)
Pt left AMA, Pt was informed the risk involve in leaving the hospital against medical advise. Urology MD came and talked to the pt prior to him leaving the room. Primary care was informed by the CN. AMA form signed by pt, girlfriend at bedside.

## 2021-03-31 NOTE — Anesthesia Postprocedure Evaluation (Signed)
Anesthesia Post Note  Patient: Raymond Bowen  Procedure(s) Performed: CYSTOSCOPY; RETROGRADE PYELOGRAM LEFT URETERAL STENT PLACEMENT (Left: Ureter)     Patient location during evaluation: PACU Anesthesia Type: General Level of consciousness: awake and alert, oriented and patient cooperative Pain management: pain level controlled Vital Signs Assessment: post-procedure vital signs reviewed and stable Respiratory status: spontaneous breathing, nonlabored ventilation and respiratory function stable Cardiovascular status: blood pressure returned to baseline and stable Postop Assessment: no apparent nausea or vomiting Anesthetic complications: no   No notable events documented.  Last Vitals:  Vitals:   03/31/21 0713 03/31/21 1015  BP: (!) 118/59 114/78  Pulse: 92 76  Resp: 20 16  Temp: 36.5 C (!) 36.3 C  SpO2: 99% 100%    Last Pain:  Vitals:   03/31/21 1015  TempSrc:   PainSc: Asleep                 Lannie Fields

## 2021-04-01 ENCOUNTER — Encounter (HOSPITAL_COMMUNITY): Payer: Self-pay | Admitting: Urology

## 2021-04-01 LAB — URINE CULTURE: Culture: NO GROWTH

## 2021-04-01 NOTE — Progress Notes (Signed)
Progress note to document patient leaving AMA following cystoscopy and insertion of left JJ stent.  I told him he had encrusted stent on the right and we did not have culture results available and highly recommended he stay for additional antibiotics.  He told me he could not stay due to family issues at home but was going to follow-up with [place] outpatient.  I stressed the importance of needing additional surgical procedure to remove his right JJ stent which could be very complicated and also to remove left ureteral calculus now that he has a left indwelling stent.  Patient is aware of the risks of leaving AMA but left anyway.

## 2021-04-02 ENCOUNTER — Telehealth: Payer: Self-pay

## 2021-04-02 NOTE — Telephone Encounter (Signed)
Transition Care Management Follow-up Telephone Call  Call returned to patient.  Date of discharge and from where: 03/31/2021, [facility]- left AMA How have you been since you were released from the hospital? He said he is doing all right. No complaints at this time.  He said he picked up the ostomy supplies at [facility] that were left for him by Maple Hudson, NP WOC last week.  Any questions or concerns? No  Items Reviewed: Did the pt receive and understand the discharge instructions provided?  Left AMA , no AVS Medications obtained and verified?  He said he has all medications that had been prescribed prior to his hospitalization  Other? No  Any new allergies since your discharge? No  Do you have support at home?  Staying by himself  Home Care and Equipment/Supplies: Were home health services ordered? no If so, what is the name of the agency? N/a  Has the agency set up a time to come to the patient's home? not applicable Were any new equipment or medical supplies ordered?  No What is the name of the medical supply agency? N/a Were you able to get the supplies/equipment? not applicable Do you have any questions related to the use of the equipment or supplies? No  Informed him that Maple Hudson, FNP sent the orders for ostomy supplies to [facility], awaiting delivery to [facility] and patient will be called when the supplies arrive.  Informed him that his voicemail is not set up and it would be in his best to have this set up if he is not able to answer calls.   Functional Questionnaire: (I = Independent and D = Dependent) ADLs: independent    Follow up appointments reviewed:  PCP Hospital f/u appt confirmed? Yes  he wanted to schedule follow up with Dr [name]  - appointment 05/08/2021 Specialist Hospital f/u appt confirmed?  None scheduled at this time    Are transportation arrangements needed? No  If their condition worsens, is the pt aware to call PCP or go to the Emergency  Dept.? Yes Was the patient provided with contact information for the PCP's office or ED? Yes Was to pt encouraged to call back with questions or concerns? Yes

## 2021-04-02 NOTE — Telephone Encounter (Signed)
From the discharge call: Patient left [facility] 03/31/2021.   He said he is doing all right. No complaints at this time.  He said he picked up the ostomy supplies at [facility] that were left for him by Maple Hudson, NP WOC last week.   Informed him that Ms Allyne Gee, FNP sent the orders for ostomy supplies to [facility], awaiting delivery to [facility] and patient will be called when the supplies arrive.  Informed him that his voicemail is not set up and it would be in his best to have this set up if he is not able to answer calls.   he wanted to schedule follow up with Dr [name]  - appointment 05/08/2021

## 2021-04-02 NOTE — Telephone Encounter (Signed)
Patient returned call back. Please call back 

## 2021-04-02 NOTE — Telephone Encounter (Signed)
Transition Care Management Unsuccessful Follow-up Telephone Call  Date of discharge and from where:  03/31/2021, [facility]- left AMA  Attempts:  1st Attempt  Reason for unsuccessful TCM follow-up call:  Unable to reach patient Multiple calls placed.  # [phone], voicemail not set up # [phone], phone just rings, no option for voice mail # [phone], recording stated that the number is not in service

## 2021-04-03 ENCOUNTER — Telehealth: Payer: Self-pay

## 2021-04-03 ENCOUNTER — Telehealth: Payer: Self-pay | Admitting: Family Medicine

## 2021-04-03 ENCOUNTER — Other Ambulatory Visit: Payer: Self-pay | Admitting: Urology

## 2021-04-03 ENCOUNTER — Encounter (HOSPITAL_COMMUNITY): Payer: Self-pay

## 2021-04-03 ENCOUNTER — Other Ambulatory Visit: Payer: Self-pay

## 2021-04-03 ENCOUNTER — Emergency Department (HOSPITAL_COMMUNITY)
Admission: EM | Admit: 2021-04-03 | Discharge: 2021-04-04 | Disposition: A | Discharge status: Home / Self Care | Payer: Medicare Other | Attending: Emergency Medicine | Admitting: Emergency Medicine

## 2021-04-03 DIAGNOSIS — Z9889 Other specified postprocedural states: Secondary | ICD-10-CM

## 2021-04-03 DIAGNOSIS — Z87891 Personal history of nicotine dependence: Secondary | ICD-10-CM | POA: Diagnosis not present

## 2021-04-03 DIAGNOSIS — Z86718 Personal history of other venous thrombosis and embolism: Secondary | ICD-10-CM | POA: Diagnosis not present

## 2021-04-03 DIAGNOSIS — R109 Unspecified abdominal pain: Secondary | ICD-10-CM | POA: Insufficient documentation

## 2021-04-03 DIAGNOSIS — Z933 Colostomy status: Secondary | ICD-10-CM | POA: Insufficient documentation

## 2021-04-03 DIAGNOSIS — R10A2 Flank pain, left side: Secondary | ICD-10-CM

## 2021-04-03 DIAGNOSIS — D631 Anemia in chronic kidney disease: Secondary | ICD-10-CM | POA: Insufficient documentation

## 2021-04-03 DIAGNOSIS — Z7901 Long term (current) use of anticoagulants: Secondary | ICD-10-CM | POA: Insufficient documentation

## 2021-04-03 DIAGNOSIS — N1832 Chronic kidney disease, stage 3b: Secondary | ICD-10-CM | POA: Insufficient documentation

## 2021-04-03 DIAGNOSIS — R1032 Left lower quadrant pain: Secondary | ICD-10-CM | POA: Diagnosis not present

## 2021-04-03 LAB — COMPREHENSIVE METABOLIC PANEL
ALT: 30 U/L (ref 0–44)
AST: 36 U/L (ref 15–41)
Albumin: 4.4 g/dL (ref 3.5–5.0)
Alkaline Phosphatase: 107 U/L (ref 38–126)
Anion gap: 11 (ref 5–15)
BUN: 33 mg/dL — ABNORMAL HIGH (ref 6–20)
CO2: 20 mmol/L — ABNORMAL LOW (ref 22–32)
Calcium: 9.6 mg/dL (ref 8.9–10.3)
Chloride: 104 mmol/L (ref 98–111)
Creatinine, Ser: 2.22 mg/dL — ABNORMAL HIGH (ref 0.61–1.24)
GFR, Estimated: 37 mL/min — ABNORMAL LOW (ref >60)
Glucose, Bld: 111 mg/dL — ABNORMAL HIGH (ref 70–99)
Potassium: 3.2 mmol/L — ABNORMAL LOW (ref 3.5–5.1)
Sodium: 135 mmol/L (ref 135–145)
Total Bilirubin: 0.4 mg/dL (ref 0.3–1.2)
Total Protein: 8.9 g/dL — ABNORMAL HIGH (ref 6.5–8.1)

## 2021-04-03 LAB — CBC WITH DIFFERENTIAL/PLATELET
Abs Immature Granulocytes: 0.08 10*3/uL — ABNORMAL HIGH (ref 0.00–0.07)
Basophils Absolute: 0.1 10*3/uL (ref 0.0–0.1)
Basophils Relative: 0 %
Eosinophils Absolute: 0.3 10*3/uL (ref 0.0–0.5)
Eosinophils Relative: 2 %
HCT: 39.7 % (ref 39.0–52.0)
Hemoglobin: 12.9 g/dL — ABNORMAL LOW (ref 13.0–17.0)
Immature Granulocytes: 1 %
Lymphocytes Relative: 18 %
Lymphs Abs: 3.2 10*3/uL (ref 0.7–4.0)
MCH: 28.6 pg (ref 26.0–34.0)
MCHC: 32.5 g/dL (ref 30.0–36.0)
MCV: 88 fL (ref 80.0–100.0)
Monocytes Absolute: 2.2 10*3/uL — ABNORMAL HIGH (ref 0.1–1.0)
Monocytes Relative: 13 %
Neutro Abs: 11.8 10*3/uL — ABNORMAL HIGH (ref 1.7–7.7)
Neutrophils Relative %: 66 %
Platelets: 300 10*3/uL (ref 150–400)
RBC: 4.51 MIL/uL (ref 4.22–5.81)
RDW: 13.2 % (ref 11.5–15.5)
WBC: 17.7 10*3/uL — ABNORMAL HIGH (ref 4.0–10.5)
nRBC: 0 % (ref 0.0–0.2)

## 2021-04-03 LAB — URINALYSIS, ROUTINE W REFLEX MICROSCOPIC
Glucose, UA: NEGATIVE mg/dL
Nitrite: NEGATIVE
Protein, ur: >300 mg/dL — AB
RBC / HPF: >50 RBC/hpf — ABNORMAL HIGH (ref 0–5)
Specific Gravity, Urine: 1.025 (ref 1.005–1.030)
pH: 6 (ref 5.0–8.0)

## 2021-04-03 NOTE — Telephone Encounter (Addendum)
Call received from patient stating he is out of ostomy supplies and his bag is leaking.  Raymond Hudson, FNP WOC paged to inquire if patient can pick up supplies at the hospital in lieu of going to ED. He has no supplies. Patient requested a call back.   Call received from  [facility] .  She explained that they they need the order signed  from provider.  They will fax the completed order form to Ms Raymond Gee, FNP  fax # [phone]. She said that once they receive the signed order, they should be able to ship the products in a day. Confirmed that the order is being shipped to [facility]- [address] [place], [place] , [place] [address]  Call returned to patient and explained that this CM has no additional  information about supply availability and he may need to go to ED. Informed patient that he will be contacted when supplies arrive at [facility]. Thanked him for calling the [facility] first to check on supplies before going to ED.   This information was shared with Oletta Cohn, RN CM/ED

## 2021-04-03 NOTE — ED Triage Notes (Signed)
Pt reports having left flank pain. Pt had a kidney stent placed on Saturday and had another stent for a while now. Pt states that they are supposed to be removed on the 21 st of this month. Pt reports pain and burning with urination.

## 2021-04-03 NOTE — Telephone Encounter (Signed)
Call placed to [facility] # [phone] to check on status of ostomy supply delivery. Message left with call back requested to this CM.  Call placed to [facility] # [phone], spoke to to [facility] who stated that patient's supply orders are managed by the Specialized Care Team- [facility]. However, she was able to confirm that the order for supplies was received on 03/30/2021 but it needs to include either frequency of change or quantity requested.  She also said that patient's insurance is very particular about the orders and they request the orders to be on a standard form.  She said that the provider should be receiving a fax with a standard order form.  They are unable to process the order until this is received.  She also said that the patient has Medicare in addition to Medicaid. Only Medicaid is listed in Epic  Medicare: [id number]

## 2021-04-03 NOTE — Telephone Encounter (Signed)
Raymond Bowen, from [facility], calling on behalf of pts surgery on 04/11/21. S/he states that they will needing to be take pt off of xarelto for 3 days in order to complete the procedure and just want to make sure this is okay. Please advise.        [phone] ext [phone]

## 2021-04-03 NOTE — Telephone Encounter (Signed)
Prescription was faxed the first day it was requested.  I can re-fax in the AM.  I was out of office this PM.

## 2021-04-04 DIAGNOSIS — R109 Unspecified abdominal pain: Secondary | ICD-10-CM | POA: Diagnosis not present

## 2021-04-04 DIAGNOSIS — R1032 Left lower quadrant pain: Secondary | ICD-10-CM | POA: Diagnosis not present

## 2021-04-04 LAB — CULTURE, BLOOD (ROUTINE X 2)
Culture: NO GROWTH
Culture: NO GROWTH
Special Requests: ADEQUATE
Special Requests: ADEQUATE

## 2021-04-04 MED ORDER — OXYCODONE-ACETAMINOPHEN 5-325 MG PO TABS: 2.0000 | ORAL_TABLET | Freq: Four times a day (QID) | ORAL | 0 refills | Status: DC | PRN

## 2021-04-04 MED ORDER — SODIUM CHLORIDE 0.9 % IV BOLUS
500.0000 mL | Freq: Once | INTRAVENOUS | Status: AC
  Administered 2021-04-04: 500 mL via INTRAVENOUS

## 2021-04-04 MED ORDER — CEPHALEXIN 500 MG PO CAPS: 500.0000 mg | ORAL_CAPSULE | Freq: Four times a day (QID) | ORAL | 0 refills | Status: DC

## 2021-04-04 MED ORDER — MORPHINE SULFATE (PF) 4 MG/ML IV SOLN
4.0000 mg | Freq: Once | INTRAVENOUS | Status: AC
  Administered 2021-04-04: 4 mg via INTRAVENOUS
  Filled 2021-04-04: qty 1

## 2021-04-04 MED ORDER — SODIUM CHLORIDE 0.9 % IV SOLN
1.0000 g | Freq: Once | INTRAVENOUS | Status: AC
  Administered 2021-04-04: 1 g via INTRAVENOUS
  Filled 2021-04-04: qty 10

## 2021-04-04 MED ORDER — ONDANSETRON HCL 4 MG/2ML IJ SOLN
4.0000 mg | Freq: Once | INTRAMUSCULAR | Status: AC
  Administered 2021-04-04: 4 mg via INTRAVENOUS
  Filled 2021-04-04: qty 2

## 2021-04-04 MED ORDER — KETOROLAC TROMETHAMINE 30 MG/ML IJ SOLN
30.0000 mg | Freq: Once | INTRAMUSCULAR | Status: AC
  Administered 2021-04-04: 30 mg via INTRAVENOUS
  Filled 2021-04-04: qty 1

## 2021-04-04 NOTE — Telephone Encounter (Signed)
Pt is needing to hold Xarelto for surgery.

## 2021-04-04 NOTE — Discharge Instructions (Addendum)
Begin taking Keflex as prescribed.  Begin taking Percocet as prescribed as needed for pain.  Follow-up with urology in the next 1 to 2 days.

## 2021-04-04 NOTE — Progress Notes (Signed)
DUE TO COVID-19 ONLY ONE VISITOR IS ALLOWED TO COME WITH YOU AND STAY IN THE WAITING ROOM ONLY DURING PRE OP AND PROCEDURE DAY OF SURGERY. THE 1 VISITOR  MAY VISIT WITH YOU AFTER SURGERY IN YOUR PRIVATE ROOM DURING VISITING HOURS ONLY!  YOU NEED TO HAVE A COVID 19 TEST ON_______ @_______ , THIS TEST MUST BE DONE BEFORE SURGERY,  COVID TESTING SITE IS AT [address] [place]. PLEASE REMAIN IN YOUR CAR THIS IS A DRIVER UP TEST. AFTER YOUR COVID TEST PLEASE WEAR A MASK OUT IN PUBLIC AND SOCIAL DISTANCE AND WASH YOUR HANDS FREQUENTLY. PLEASE ASK ALL YOUR CLOSE CONTACTS TO WEAR A MASK OUT IN PUBLIC AND SOCIAL DISTANCE AND WASH HANDS FREQUENTLY ALSO.               Raymond Bowen  04/04/2021   Your procedure is scheduled on:  04/11/2021   Report to [facility] Main  Entrance   Report to admitting at    [phone]     Call this number if you have problems the morning of surgery [phone]    Remember: Do not eat food , candy gum or mints :After Midnight. You may have clear liquids from midnight until __ 0530am    CLEAR LIQUID DIET   Foods Allowed                                                                       Coffee and tea, regular and decaf                              Plain Jell-O any favor except red or purple                                            Fruit ices (not with fruit pulp)                                      Iced Popsicles                                     Carbonated beverages, regular and diet                                    Cranberry, grape and apple juices Sports drinks like Gatorade Lightly seasoned clear broth or consume(fat free) Sugar   _____________________________________________________________________    BRUSH YOUR TEETH MORNING OF SURGERY AND RINSE YOUR MOUTH OUT, NO CHEWING GUM CANDY OR MINTS.     Take these medicines the morning of surgery with A SIP OF WATER:  cymbalta, lyrica   DO NOT TAKE ANY DIABETIC MEDICATIONS DAY OF  YOUR SURGERY  You may not have any metal on your body including hair pins and              piercings  Do not wear jewelry, make-up, lotions, powders or perfumes, deodorant             Do not wear nail polish on your fingernails.  Do not shave  48 hours prior to surgery.              Men may shave face and neck.   Do not bring valuables to the hospital. [place].  Contacts, dentures or bridgework may not be worn into surgery.  Leave suitcase in the car. After surgery it may be brought to your room.     Patients discharged the day of surgery will not be allowed to drive home. IF YOU ARE HAVING SURGERY AND GOING HOME THE SAME DAY, YOU MUST HAVE AN ADULT TO DRIVE YOU HOME AND BE WITH YOU FOR 24 HOURS. YOU MAY GO HOME BY TAXI OR UBER OR ORTHERWISE, BUT AN ADULT MUST ACCOMPANY YOU HOME AND STAY WITH YOU FOR 24 HOURS.  Name and phone number of your driver:  Special Instructions: N/A              Please read over the following fact sheets you were given: _____________________________________________________________________  [facility] - Preparing for Surgery Before surgery, you can play an important role.  Because skin is not sterile, your skin needs to be as free of germs as possible.  You can reduce the number of germs on your skin by washing with CHG (chlorahexidine gluconate) soap before surgery.  CHG is an antiseptic cleaner which kills germs and bonds with the skin to continue killing germs even after washing. Please DO NOT use if you have an allergy to CHG or antibacterial soaps.  If your skin becomes reddened/irritated stop using the CHG and inform your nurse when you arrive at Short Stay. Do not shave (including legs and underarms) for at least 48 hours prior to the first CHG shower.  You may shave your face/neck. Please follow these instructions carefully:  1.  Shower with CHG Soap the night before surgery and  the  morning of Surgery.  2.  If you choose to wash your hair, wash your hair first as usual with your  normal  shampoo.  3.  After you shampoo, rinse your hair and body thoroughly to remove the  shampoo.                           4.  Use CHG as you would any other liquid soap.  You can apply chg directly  to the skin and wash                       Gently with a scrungie or clean washcloth.  5.  Apply the CHG Soap to your body ONLY FROM THE NECK DOWN.   Do not use on face/ open                           Wound or open sores. Avoid contact with eyes, ears mouth and genitals (private parts).  Wash face,  Genitals (private parts) with your normal soap.             6.  Wash thoroughly, paying special attention to the area where your surgery  will be performed.  7.  Thoroughly rinse your body with warm water from the neck down.  8.  DO NOT shower/wash with your normal soap after using and rinsing off  the CHG Soap.                9.  Pat yourself dry with a clean towel.            10.  Wear clean pajamas.            11.  Place clean sheets on your bed the night of your first shower and do not  sleep with pets. Day of Surgery : Do not apply any lotions/deodorants the morning of surgery.  Please wear clean clothes to the hospital/surgery center.  FAILURE TO FOLLOW THESE INSTRUCTIONS MAY RESULT IN THE CANCELLATION OF YOUR SURGERY PATIENT SIGNATURE_________________________________  NURSE SIGNATURE__________________________________  ________________________________________________________________________

## 2021-04-04 NOTE — Telephone Encounter (Signed)
Yes he can hold for 3 days prior to procedure and resume 24 hours post procedure.

## 2021-04-04 NOTE — Telephone Encounter (Signed)
Call placed to [facility] # [phone] to inquire about order form for ostomy supplies.  Ms Raymond Gee, FNP has not received a fax. Message left with call back requested to this CM.

## 2021-04-04 NOTE — ED Notes (Signed)
Pt given a colostomy bag, a drink and a bus pass
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
## 2021-04-04 NOTE — Telephone Encounter (Signed)
I do not have an additional request fax from them. The fax number is hand written on there.  [phone].  Nothing has been sent

## 2021-04-05 LAB — URINE CULTURE: Culture: <10000 — AB

## 2021-04-05 NOTE — Telephone Encounter (Signed)
Call placed to [facility] Service team, message left with call back requested.  Call placed to [facility] # [phone], spoke to [facility] who was also sending message to the Managed Care Services team requesting an update on the order status. Requested they email the document to this CM to forward to provider

## 2021-04-05 NOTE — Telephone Encounter (Signed)
Spoke to [facility] with [facility] and informed message regarding Xarelto per Dr. [name].   Unable to inform patient. Did not answer phone. No voicemail set up.

## 2021-04-05 NOTE — Telephone Encounter (Signed)
I checked again today, no fax for me to sign.

## 2021-04-06 ENCOUNTER — Encounter (HOSPITAL_COMMUNITY)
Admission: RE | Admit: 2021-04-06 | Discharge: 2021-04-06 | Disposition: A | Discharge status: Home / Self Care | Payer: Medicaid Other | Source: Ambulatory Visit | Attending: Family Medicine | Admitting: Family Medicine

## 2021-04-06 ENCOUNTER — Ambulatory Visit: Payer: Self-pay | Admitting: *Deleted

## 2021-04-06 NOTE — Telephone Encounter (Signed)
Patient called requesting an update on his order. I informed patient that there was not up date and the patient got upset and started yelling. Patient requested to speak with the case manger and I informed him she was not here today. Then he requested to speak with a provider and I informed him that the providers were in clinic and he hanged up the phone.

## 2021-04-06 NOTE — Progress Notes (Addendum)
Anesthesia Review:  PCP: Cardiologist : Chest x-ray : EKG : 02/26/21  Echo : 02/26/21  02/27/21- Pulm Perf  Stress test: Cardiac Cath :  Activity level:  Sleep Study/ CPAP : Fasting Blood Sugar :      / Checks Blood Sugar -- times a day:   Blood Thinner/ Instructions /Last Dose: ASA / Instructions/ Last Dose :   IN Ed 04/03/21 for flank pain  03/31/21- surgery  04/03/21- white count- 17.7 K- 3.2  No covid test- ambulatory surgery  At 0905 am on 04/06/21 pt had not yet ararived for preop appt.   Called [phone] and voice mail not yet set up.  [phone] not in service and [phone] no answer.  PT not in admitting.  Called again at 0915 with no answers and pt not in admitting.    At 1020am attempted to call pt at all 3 numbers to no avail.  LVMM for Pam Giubson at [employer].   Meridee Score, at 1230pm told me she did get in touch with pt at [phone] phone number.  Pt was upset over not having colostomy supplies at home.  Meridee Score stated to the pt that he  could be done via phone and set up for phone call preop appt at 200pm.  AT 200pm attempted to call pt at all 3 phone numbers listed above to no avail.  Meridee Score stated she also LVMM for Lossie Faes in regards to above.

## 2021-04-06 NOTE — Telephone Encounter (Signed)
Pt had called in and Ja-Kwan took his call.   As Ja-Kwan, agent was giving me his information pt hung up.   Ja-Kwan mentioned all he said was asking if Ja-Kwan was a Engineer, civil (consulting).  I called pt back.   He answered by yelling,   "Who is this?"   I let him know who I was and why I was calling him back.   He then talked loudly and agitated with me regarding colostomy bags.   See documentation notes.   I typed out the conversation as it progressed until he hung up on me.  I forwarded the conversation to [facility] since he has been seen there in the past.

## 2021-04-06 NOTE — Telephone Encounter (Signed)
Attempted to contact patient to provide update on ostomy order - the order form for the supplies was received from [facility] this morning and this CM sent to Maple Hudson, FNP WOC for signature.    Call placed to # [phone], voicemail not set up , unable to leave message Called # [phone], phone just rings, no option to leave a message Called # [phone], recording stated that the number is not in service.

## 2021-04-06 NOTE — Telephone Encounter (Signed)
Email with order form for ostomy supplies received from [facility] and forwarded to Maple Hudson, FNP  WOC for signature.

## 2021-04-06 NOTE — Telephone Encounter (Signed)
I called pt. Back because he hung up while the agent was giving me his information which was,  "Are you a nurse?"   "I need to talk to a nurse" per Ja-Kwan the agent.   He did not tell Ja-Kwan what he needed.  "Y'all were supposed to have my colostomy bags from 3 weeks ago".    "I've been waiting 3 weeks".    What am I'm supposed to do?  I don't have any pouches on now!  What am I supposed to do?   Y'all making me look dumb!    I can't go to the hospital!   I don't understand!   I'm waiting for a month now!   They were supposed to order my pouchs.  (Pt was talking loudly and fast). They need to be fired all of them!   I'll be dead waiting on y'all.   No one cares.   I call every day.  You say "you will call me back"   "No one  calls me back".    "No one cares". Pt hung up at this point.     One of you raggidty nurses call [facility] now and tell them I need my pouches now!  He hung up.   Reason for Disposition  [1] Caller has URGENT question AND [2] triager unable to answer question    Needing colostomy pouches  Answer Assessment - Initial Assessment Questions 1. TYPE: "What type of ostomy do you have?" (e.g., ileostomy, colostomy; temporary, long-term [permanent]).     Colostomy.   Pt talking loudly and upset that no one has called him back about ostomy pouches.   See documentation notes for what he said.   He hung up before I was able to assist him.   2. LOCATION: "Where is the ostomy located?"     Not asked   Unable to ask due to pt agitated and talking loudly and fast to me. 3. DURATION: "How long have you had the ostomy?" (e.g., days, weeks, months, years).     Not asked 4. MAIN CONCERN: "What is your main concern or symptom today?" (e.g., skin redness or irritation, bleeding, leaking, change in output or appearance of ostomy).     He does not have any ostomy pouches.   Apparently they are supposed to be ordered but I'm not sure who is doing that or who ordered them.   He would not calm  down where I could ask him for any information.   He called in for [facility].  He mentioned,   "I don't have nothing on it right now". 5. DAILY OSTOMY OUTPUT DIARY: "Do you keep a daily diary of the output from your ostomy"?      N/A 6. OSTOMY OUTPUT: "How much stool output over a 24 hour period are you having?" (e.g.,  ml; consistency of water, milkshake, pudding)     N/A 7. OTHER SYMPTOMS: "Are you having any other symptoms?" (e.g., fever, vomiting, blood in stool, abdominal pain, dizziness)     Unable to ask.   He was inquiring about ostomy pouches.  Protocols used: Ostomy Symptoms and Questions-A-AH

## 2021-04-08 ENCOUNTER — Telehealth: Payer: Self-pay

## 2021-04-08 NOTE — Telephone Encounter (Signed)
Patient called in stating he does not  have any ostomy supplies  He has been using crazy glue and  trash bags for ostomy. Looked at notes from previous, having trouble obtaining ostomy supplies and documentation for ordering such, supposed to be ordering through [facility], He has to have a procedure on Tuesday. Will obtain/ order  2 bags to get him through until Tuesday. Hopefully this will be sorted out, and he will not need further intervention.

## 2021-04-09 NOTE — Telephone Encounter (Signed)
THis has been faxed in now.

## 2021-04-10 ENCOUNTER — Encounter (HOSPITAL_COMMUNITY): Payer: Self-pay | Admitting: Anesthesiology

## 2021-04-10 NOTE — Anesthesia Preprocedure Evaluation (Deleted)
Anesthesia Evaluation    Airway        Dental   Pulmonary Patient abstained from smoking., former smoker,           Cardiovascular      Neuro/Psych    GI/Hepatic   Endo/Other    Renal/GU Renal disease     Musculoskeletal   Abdominal   Peds  Hematology   Anesthesia Other Findings   Reproductive/Obstetrics                             Anesthesia Physical Anesthesia Plan  ASA: 3  Anesthesia Plan: General   Post-op Pain Management:    Induction:   PONV Risk Score and Plan:   Airway Management Planned: LMA  Additional Equipment:   Intra-op Plan:   Post-operative Plan:   Informed Consent:   Plan Discussed with: Anesthesiologist  Anesthesia Plan Comments:         Anesthesia Quick Evaluation

## 2021-04-11 ENCOUNTER — Encounter (HOSPITAL_COMMUNITY)
Admission: AD | Disposition: A | Discharge status: Home / Self Care | Payer: Self-pay | Source: Ambulatory Visit | Attending: Urology

## 2021-04-11 ENCOUNTER — Other Ambulatory Visit: Payer: Self-pay

## 2021-04-11 ENCOUNTER — Inpatient Hospital Stay (HOSPITAL_COMMUNITY): Payer: Medicare Other | Admitting: Certified Registered Nurse Anesthetist

## 2021-04-11 ENCOUNTER — Inpatient Hospital Stay (HOSPITAL_COMMUNITY): Payer: Medicare Other

## 2021-04-11 ENCOUNTER — Ambulatory Visit (HOSPITAL_COMMUNITY): Admission: RE | Admit: 2021-04-11 | Payer: Medicaid Other | Source: Home / Self Care | Admitting: Urology

## 2021-04-11 ENCOUNTER — Encounter (HOSPITAL_COMMUNITY): Payer: Self-pay

## 2021-04-11 ENCOUNTER — Inpatient Hospital Stay (HOSPITAL_COMMUNITY)
Admission: AD | Admit: 2021-04-11 | Discharge: 2021-04-14 | DRG: 854 | Disposition: A | Discharge status: Home / Self Care | Payer: Medicare Other | Source: Ambulatory Visit | Attending: Urology | Admitting: Urology

## 2021-04-11 ENCOUNTER — Emergency Department (HOSPITAL_COMMUNITY)
Admission: EM | Admit: 2021-04-11 | Discharge: 2021-04-11 | Discharge status: Against Medical Advice | Payer: Medicaid Other | Attending: Emergency Medicine | Admitting: Emergency Medicine

## 2021-04-11 ENCOUNTER — Encounter (HOSPITAL_COMMUNITY): Payer: Self-pay | Admitting: Urology

## 2021-04-11 DIAGNOSIS — Z832 Family history of diseases of the blood and blood-forming organs and certain disorders involving the immune mechanism: Secondary | ICD-10-CM

## 2021-04-11 DIAGNOSIS — N202 Calculus of kidney with calculus of ureter: Secondary | ICD-10-CM | POA: Diagnosis not present

## 2021-04-11 DIAGNOSIS — Z807 Family history of other malignant neoplasms of lymphoid, hematopoietic and related tissues: Secondary | ICD-10-CM

## 2021-04-11 DIAGNOSIS — Z91013 Allergy to seafood: Secondary | ICD-10-CM

## 2021-04-11 DIAGNOSIS — K9409 Other complications of colostomy: Secondary | ICD-10-CM | POA: Diagnosis not present

## 2021-04-11 DIAGNOSIS — Z87891 Personal history of nicotine dependence: Secondary | ICD-10-CM

## 2021-04-11 DIAGNOSIS — R338 Other retention of urine: Secondary | ICD-10-CM | POA: Diagnosis not present

## 2021-04-11 DIAGNOSIS — G8929 Other chronic pain: Secondary | ICD-10-CM | POA: Diagnosis not present

## 2021-04-11 DIAGNOSIS — A419 Sepsis, unspecified organism: Principal | ICD-10-CM | POA: Diagnosis present

## 2021-04-11 DIAGNOSIS — N200 Calculus of kidney: Secondary | ICD-10-CM

## 2021-04-11 DIAGNOSIS — Z9114 Patient's other noncompliance with medication regimen: Secondary | ICD-10-CM

## 2021-04-11 DIAGNOSIS — T83192A Other mechanical complication of urinary stent, initial encounter: Secondary | ICD-10-CM | POA: Diagnosis not present

## 2021-04-11 DIAGNOSIS — Z5321 Procedure and treatment not carried out due to patient leaving prior to being seen by health care provider: Secondary | ICD-10-CM | POA: Insufficient documentation

## 2021-04-11 DIAGNOSIS — Z888 Allergy status to other drugs, medicaments and biological substances status: Secondary | ICD-10-CM

## 2021-04-11 DIAGNOSIS — Z8249 Family history of ischemic heart disease and other diseases of the circulatory system: Secondary | ICD-10-CM

## 2021-04-11 DIAGNOSIS — Z79899 Other long term (current) drug therapy: Secondary | ICD-10-CM

## 2021-04-11 DIAGNOSIS — N9989 Other postprocedural complications and disorders of genitourinary system: Secondary | ICD-10-CM | POA: Diagnosis not present

## 2021-04-11 DIAGNOSIS — N1832 Chronic kidney disease, stage 3b: Secondary | ICD-10-CM | POA: Diagnosis present

## 2021-04-11 DIAGNOSIS — Z823 Family history of stroke: Secondary | ICD-10-CM

## 2021-04-11 DIAGNOSIS — Z932 Ileostomy status: Secondary | ICD-10-CM

## 2021-04-11 DIAGNOSIS — Z86718 Personal history of other venous thrombosis and embolism: Secondary | ICD-10-CM

## 2021-04-11 DIAGNOSIS — Z7901 Long term (current) use of anticoagulants: Secondary | ICD-10-CM

## 2021-04-11 DIAGNOSIS — N136 Pyonephrosis: Secondary | ICD-10-CM | POA: Diagnosis present

## 2021-04-11 DIAGNOSIS — F1510 Other stimulant abuse, uncomplicated: Secondary | ICD-10-CM | POA: Diagnosis present

## 2021-04-11 DIAGNOSIS — F1410 Cocaine abuse, uncomplicated: Secondary | ICD-10-CM | POA: Diagnosis present

## 2021-04-11 DIAGNOSIS — M21371 Foot drop, right foot: Secondary | ICD-10-CM | POA: Diagnosis present

## 2021-04-11 HISTORY — PX: CYSTOSCOPY/URETEROSCOPY/HOLMIUM LASER/STENT PLACEMENT: SHX6546

## 2021-04-11 SURGERY — CYSTOSCOPY/URETEROSCOPY/HOLMIUM LASER/STENT PLACEMENT
Anesthesia: General | Laterality: Bilateral

## 2021-04-11 MED ORDER — OXYCODONE HCL 5 MG PO TABS
5.0000 mg | ORAL_TABLET | ORAL | Status: DC | PRN
  Administered 2021-04-12 – 2021-04-14 (×4): 5 mg via ORAL
  Filled 2021-04-11 (×4): qty 1

## 2021-04-11 MED ORDER — PREGABALIN 100 MG PO CAPS
100.0000 mg | ORAL_CAPSULE | Freq: Two times a day (BID) | ORAL | Status: DC
  Administered 2021-04-11 – 2021-04-14 (×6): 100 mg via ORAL
  Filled 2021-04-11 (×6): qty 1

## 2021-04-11 MED ORDER — FENTANYL CITRATE PF 50 MCG/ML IJ SOSY
PREFILLED_SYRINGE | INTRAMUSCULAR | Status: AC
  Administered 2021-04-11: 50 ug via INTRAVENOUS
  Filled 2021-04-11: qty 1

## 2021-04-11 MED ORDER — FENTANYL CITRATE PF 50 MCG/ML IJ SOSY: 25.0000 ug | PREFILLED_SYRINGE | INTRAMUSCULAR | Status: DC | PRN

## 2021-04-11 MED ORDER — BELLADONNA ALKALOIDS-OPIUM 16.2-60 MG RE SUPP
1.0000 | Freq: Four times a day (QID) | RECTAL | Status: DC | PRN
  Filled 2021-04-11: qty 1

## 2021-04-11 MED ORDER — ONDANSETRON HCL 4 MG/2ML IJ SOLN
INTRAMUSCULAR | Status: DC | PRN
  Administered 2021-04-11: 4 mg via INTRAVENOUS

## 2021-04-11 MED ORDER — IOHEXOL 300 MG/ML  SOLN
INTRAMUSCULAR | Status: DC | PRN
  Administered 2021-04-11: 20 mL

## 2021-04-11 MED ORDER — LIDOCAINE 2% (20 MG/ML) 5 ML SYRINGE
INTRAMUSCULAR | Status: DC | PRN
Start: 2021-04-11 — End: 2021-04-11
  Administered 2021-04-11: 100 mg via INTRAVENOUS

## 2021-04-11 MED ORDER — MIDAZOLAM HCL 5 MG/5ML IJ SOLN
INTRAMUSCULAR | Status: DC | PRN
  Administered 2021-04-11 (×2): 1 mg via INTRAVENOUS

## 2021-04-11 MED ORDER — FENTANYL CITRATE PF 50 MCG/ML IJ SOSY
PREFILLED_SYRINGE | INTRAMUSCULAR | Status: AC
  Filled 2021-04-11: qty 1

## 2021-04-11 MED ORDER — DEXAMETHASONE SODIUM PHOSPHATE 10 MG/ML IJ SOLN
INTRAMUSCULAR | Status: DC | PRN
  Administered 2021-04-11: 5 mg via INTRAVENOUS

## 2021-04-11 MED ORDER — 0.9 % SODIUM CHLORIDE (POUR BTL) OPTIME
TOPICAL | Status: DC | PRN
  Administered 2021-04-11: 1000 mL

## 2021-04-11 MED ORDER — FENTANYL CITRATE (PF) 100 MCG/2ML IJ SOLN
INTRAMUSCULAR | Status: DC | PRN
  Administered 2021-04-11: 100 ug via INTRAVENOUS

## 2021-04-11 MED ORDER — CEFAZOLIN SODIUM-DEXTROSE 2-4 GM/100ML-% IV SOLN
2.0000 g | INTRAVENOUS | Status: AC
  Administered 2021-04-11: 2 g via INTRAVENOUS

## 2021-04-11 MED ORDER — MENTHOL 3 MG MT LOZG
1.0000 | LOZENGE | OROMUCOSAL | Status: DC | PRN
  Filled 2021-04-11: qty 9

## 2021-04-11 MED ORDER — ONDANSETRON HCL 4 MG/2ML IJ SOLN: 4.0000 mg | INTRAMUSCULAR | Status: DC | PRN

## 2021-04-11 MED ORDER — LACTATED RINGERS IV SOLN: INTRAVENOUS | Status: DC | PRN

## 2021-04-11 MED ORDER — OXYBUTYNIN CHLORIDE 5 MG PO TABS
5.0000 mg | ORAL_TABLET | Freq: Three times a day (TID) | ORAL | Status: DC | PRN
  Administered 2021-04-12: 5 mg via ORAL
  Filled 2021-04-11: qty 1

## 2021-04-11 MED ORDER — DULOXETINE HCL 60 MG PO CPEP
60.0000 mg | ORAL_CAPSULE | Freq: Every day | ORAL | Status: DC
  Administered 2021-04-11 – 2021-04-14 (×4): 60 mg via ORAL
  Filled 2021-04-11 (×4): qty 1

## 2021-04-11 MED ORDER — SODIUM CHLORIDE 0.9 % IR SOLN
Status: DC | PRN
  Administered 2021-04-11: 12000 mL

## 2021-04-11 MED ORDER — HYDROMORPHONE HCL 1 MG/ML IJ SOLN
0.5000 mg | INTRAMUSCULAR | Status: DC | PRN
  Administered 2021-04-12 – 2021-04-14 (×8): 1 mg via INTRAVENOUS
  Filled 2021-04-11 (×8): qty 1

## 2021-04-11 MED ORDER — DOCUSATE SODIUM 100 MG PO CAPS
100.0000 mg | ORAL_CAPSULE | Freq: Two times a day (BID) | ORAL | Status: DC
  Administered 2021-04-11 – 2021-04-13 (×4): 100 mg via ORAL
  Filled 2021-04-11 (×5): qty 1

## 2021-04-11 MED ORDER — ACETAMINOPHEN 325 MG PO TABS
650.0000 mg | ORAL_TABLET | ORAL | Status: DC | PRN
  Filled 2021-04-11: qty 2

## 2021-04-11 MED ORDER — DIPHENHYDRAMINE HCL 12.5 MG/5ML PO ELIX: 12.5000 mg | ORAL_SOLUTION | Freq: Four times a day (QID) | ORAL | Status: DC | PRN

## 2021-04-11 MED ORDER — ZOLPIDEM TARTRATE 5 MG PO TABS
5.0000 mg | ORAL_TABLET | Freq: Every evening | ORAL | Status: DC | PRN
  Administered 2021-04-12: 5 mg via ORAL
  Filled 2021-04-11: qty 1

## 2021-04-11 MED ORDER — ACETAMINOPHEN 500 MG PO TABS
1000.0000 mg | ORAL_TABLET | Freq: Once | ORAL | Status: AC
  Administered 2021-04-11: 1000 mg via ORAL

## 2021-04-11 MED ORDER — PROPOFOL 10 MG/ML IV BOLUS
INTRAVENOUS | Status: DC | PRN
  Administered 2021-04-11: 200 mg via INTRAVENOUS
  Administered 2021-04-11: 30 mg via INTRAVENOUS

## 2021-04-11 MED ORDER — FUROSEMIDE 20 MG PO TABS
20.0000 mg | ORAL_TABLET | Freq: Every day | ORAL | Status: DC
  Administered 2021-04-11 – 2021-04-14 (×4): 20 mg via ORAL
  Filled 2021-04-11 (×4): qty 1

## 2021-04-11 MED ORDER — DIPHENHYDRAMINE HCL 50 MG/ML IJ SOLN: 12.5000 mg | Freq: Four times a day (QID) | INTRAMUSCULAR | Status: DC | PRN

## 2021-04-11 SURGICAL SUPPLY — 27 items
BAG URO CATCHER STRL LF (MISCELLANEOUS) ×2 IMPLANT
BASKET LASER NITINOL 1.9FR (BASKET) IMPLANT
BASKET ZERO TIP NITINOL 2.4FR (BASKET) ×1 IMPLANT
BSKT STON RTRVL 120 1.9FR (BASKET)
BSKT STON RTRVL ZERO TP 2.4FR (BASKET) ×1
CATH INTERMIT  6FR 70CM (CATHETERS) ×2 IMPLANT
CATH URET DUAL LUMEN 6-10FR 50 (CATHETERS) ×1 IMPLANT
CLOTH BEACON ORANGE TIMEOUT ST (SAFETY) ×1 IMPLANT
DRSG TEGADERM 2-3/8X2-3/4 SM (GAUZE/BANDAGES/DRESSINGS) ×1 IMPLANT
EXTRACTOR STONE 1.7FRX115CM (UROLOGICAL SUPPLIES) IMPLANT
GLOVE SURG ENC MOIS LTX SZ7.5 (GLOVE) ×2 IMPLANT
GOWN STRL REUS W/TWL XL LVL3 (GOWN DISPOSABLE) ×2 IMPLANT
GUIDEWIRE ANG ZIPWIRE 038X150 (WIRE) IMPLANT
GUIDEWIRE STR DUAL SENSOR (WIRE) ×4 IMPLANT
KIT TURNOVER KIT A (KITS) ×2 IMPLANT
LASER FIB FLEXIVA PULSE ID 365 (Laser) IMPLANT
MANIFOLD NEPTUNE II (INSTRUMENTS) ×2 IMPLANT
PACK CYSTO (CUSTOM PROCEDURE TRAY) ×2 IMPLANT
SHEATH URETERAL 12FRX28CM (UROLOGICAL SUPPLIES) IMPLANT
SHEATH URETERAL 12FRX35CM (MISCELLANEOUS) ×1 IMPLANT
STENT URET 6FRX26 CONTOUR (STENTS) ×2 IMPLANT
SYR TOOMEY IRRIG 70ML (MISCELLANEOUS) ×2
SYRINGE TOOMEY IRRIG 70ML (MISCELLANEOUS) IMPLANT
TRACTIP FLEXIVA PULS ID 200XHI (Laser) IMPLANT
TRACTIP FLEXIVA PULSE ID 200 (Laser) ×2
TUBING CONNECTING 10 (TUBING) ×2 IMPLANT
TUBING UROLOGY SET (TUBING) ×2 IMPLANT

## 2021-04-11 NOTE — Anesthesia Preprocedure Evaluation (Addendum)
Anesthesia Evaluation  Patient identified by MRN, date of birth, ID band Patient awake    Reviewed: Allergy & Precautions, NPO status , Patient's Chart, lab work & pertinent test results  Airway Mallampati: II  TM Distance: >3 FB     Dental   Pulmonary Patient abstained from smoking., former smoker,    breath sounds clear to auscultation       Cardiovascular negative cardio ROS   Rhythm:Regular Rate:Normal     Neuro/Psych  Neuromuscular disease    GI/Hepatic negative GI ROS, Neg liver ROS,   Endo/Other    Renal/GU Renal disease     Musculoskeletal   Abdominal   Peds  Hematology   Anesthesia Other Findings   Reproductive/Obstetrics                             Anesthesia Physical Anesthesia Plan  ASA: 3  Anesthesia Plan: General   Post-op Pain Management:    Induction: Intravenous  PONV Risk Score and Plan: 3 and Ondansetron, Dexamethasone and Midazolam  Airway Management Planned: LMA  Additional Equipment:   Intra-op Plan:   Post-operative Plan: Extubation in OR  Informed Consent: I have reviewed the patients History and Physical, chart, labs and discussed the procedure including the risks, benefits and alternatives for the proposed anesthesia with the patient or authorized representative who has indicated his/her understanding and acceptance.     Dental advisory given  Plan Discussed with: CRNA and Anesthesiologist  Anesthesia Plan Comments:        Anesthesia Quick Evaluation

## 2021-04-11 NOTE — Discharge Instructions (Signed)
[facility] [phone] Post Ureteroscopy With or Without Stent Instructions   Remove your stent on next Wednesday morning.  Do not remove them earlier.  Pull them out by gently pulling the strings.  Definitions:  Ureter: The duct that transports urine from the kidney to the bladder. Stent:   A plastic hollow tube that is placed into the ureter, from the kidney to the                 bladder to prevent the ureter from swelling shut.  GENERAL INSTRUCTIONS:  Despite the fact that no skin incisions were used, the area around the ureter and bladder is raw and irritated. The stent is a foreign body which will further irritate the bladder wall. This irritation is manifested by increased frequency of urination, both day and night, and by an increase in the urge to urinate. In some, the urge to urinate is present almost always. Sometimes the urge is strong enough that you may not be able to stop yourself from urinating. The only real cure is to remove the stent and then give time for the bladder wall to heal which can't be done until the danger of the ureter swelling shut has passed, which varies.  You may see some blood in your urine while the stent is in place and a few days afterwards. Do not be alarmed, even if the urine was clear for a while. Get off your feet and drink lots of fluids until clearing occurs. If you start to pass clots or don't improve, call us.  DIET: You may return to your normal diet immediately. Because of the raw surface of your bladder, alcohol, spicy foods, acid type foods and drinks with caffeine may cause irritation or frequency and should be used in moderation. To keep your urine flowing freely and to avoid constipation, drink plenty of fluids during the day ( 8-10 glasses ). Tip: Avoid cranberry juice because it is very acidic.  ACTIVITY: Your physical activity doesn't need to be restricted. However, if you are very active, you may see some blood in  your urine. We suggest that you reduce your activity under these circumstances until the bleeding has stopped.  BOWELS: It is important to keep your bowels regular during the postoperative period. Straining with bowel movements can cause bleeding. A bowel movement every other day is reasonable. Use a mild laxative if needed, such as Milk of Magnesia 2-3 tablespoons, or 2 Dulcolax tablets. Call if you continue to have problems. If you have been taking narcotics for pain, before, during or after your surgery, you may be constipated. Take a laxative if necessary.   MEDICATION: You should resume your pre-surgery medications unless told not to. You may take oxybutynin or flomax if prescribed for bladder spasms or discomfort from the stent Take pain medication as directed for pain refractory to conservative management  PROBLEMS YOU SHOULD REPORT TO [place]: Fevers over 100.5 Fahrenheit. Heavy bleeding, or clots ( See above notes about blood in urine ). Inability to urinate. Drug reactions ( hives, rash, nausea, vomiting, diarrhea ). Severe burning or pain with urination that is not improving.

## 2021-04-11 NOTE — Transfer of Care (Signed)
Immediate Anesthesia Transfer of Care Note  Patient: Raymond Bowen  Procedure(s) Performed: CYSTOSCOPY, URETEROSCOPY, HOLMIUM LASER and STENT PLACEMENT (Bilateral)  Patient Location: PACU  Anesthesia Type:General  Level of Consciousness: sedated, patient cooperative and responds to stimulation  Airway & Oxygen Therapy: Patient Spontanous Breathing and Patient connected to face mask oxygen  Post-op Assessment: Report given to RN and Post -op Vital signs reviewed and stable  Post vital signs: Reviewed and stable  Last Vitals:  Vitals Value Taken Time  BP 135/86 04/11/21 1056  Temp    Pulse 84 04/11/21 1059  Resp 15 04/11/21 1100  SpO2 100 % 04/11/21 1059  Vitals shown include unvalidated device data.  Last Pain:  Vitals:   04/11/21 0726  TempSrc:   PainSc: 0-No pain         Complications: No notable events documented.

## 2021-04-11 NOTE — Anesthesia Postprocedure Evaluation (Signed)
Anesthesia Post Note  Patient: Raymond Bowen  Procedure(s) Performed: CYSTOSCOPY, URETEROSCOPY, HOLMIUM LASER and STENT PLACEMENT (Bilateral)     Patient location during evaluation: PACU Anesthesia Type: General Level of consciousness: awake Pain management: pain level controlled Vital Signs Assessment: post-procedure vital signs reviewed and stable Respiratory status: spontaneous breathing Cardiovascular status: stable Postop Assessment: no apparent nausea or vomiting Anesthetic complications: no   No notable events documented.  Last Vitals:  Vitals:   04/11/21 1100 04/11/21 1115  BP: 136/87 122/90  Pulse: 84 97  Resp: 15 16  Temp: 36.4 C   SpO2: 100% 100%    Last Pain:  Vitals:   04/11/21 1100  TempSrc:   PainSc: Asleep                 Romie Keeble
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
## 2021-04-11 NOTE — Telephone Encounter (Signed)
Call placed to [facility] # [phone] to check on status of supply delivery. Spoke to [place], managed care answering service. She explained that the order was shipped yesterday, 04/10/2021, to patient's address -  [address] , [place], [place]. Explained to her that the request was made to have the order shipped to [facility] because the patient does not have a permanent address. She said they will try to re-route the shipment to [facility] to arrive by Friday- 04/13/2021.  If it can not be re-routed, they will have delivery continue to patient's home. Provided her with the call back # for this CM if questions arise.

## 2021-04-11 NOTE — Interval H&P Note (Signed)
History and Physical Interval Note:  04/11/2021 8:34 AM  Raymond Bowen  has presented today for surgery, with the diagnosis of URETERAL STONE.  The various methods of treatment have been discussed with the patient and family. After consideration of risks, benefits and other options for treatment, the patient has consented to  Procedure(s): CYSTOSCOPY/URETEROSCOPY/HOLMIUM LASER/STENT PLACEMENT (Bilateral) as a surgical intervention.  The patient's history has been reviewed, patient examined, no change in status, stable for surgery.  I have reviewed the patient's chart and labs.  Questions were answered to the patient's satisfaction.     Ray Church, [name]

## 2021-04-11 NOTE — Addendum Note (Signed)
Addendum  created 04/11/21 1134 by Dorris Singh, MD   Clinical Note Signed, Delete clinical note

## 2021-04-11 NOTE — OR Nursing (Signed)
Right retained ureteral stent removed by Dr. Alvester Morin.

## 2021-04-11 NOTE — Op Note (Signed)
Operative Note  Preoperative diagnosis:  1.  Right retained ureteral stent 2.  Left renal/ureteral calculus  Postoperative diagnosis: Same  Procedure(s): 1. Cystoscopy with bilateral retrograde pyelogram, right ureteroscopy with laser lithotripsy with complicated right ureteral stent removal and stent exchange, left ureteroscopy with laser lithotripsy and ureteral stent placement  Surgeon: Link Snuffer, MD  Assistants: None  Anesthesia: General  Complications: None immediate  EBL: Minimal  Specimens: 1.  None  Drains/Catheters: 1.  Bilateral 6 x 26 double-J ureteral stents with strings  Intraoperative findings: 1.  Normal anterior urethra 2.  Normal prostate 3.  Normal bladder mucosa 4.  Firm and encrusted right ureteral stent.  I was not able to extract it.  Ureteroscopy revealed a proximal stone encrustation that was lasered and then the stent was able be removed.  Retrograde pyelogram revealed no evidence of ureteral extravasation.  No hydronephrosis. 5.  Left ureteroscopy revealed 2 stones in the kidney that were laser fragmented to tiny fragments.  No ureteral stones.  Retrograde pyelogram at the end showed no hydronephrosis.  Indication: 45 year old male status post right ureteroscopy with laser lithotripsy and ureteral stent placement in the past never followed up for stent removal despite warnings and several opportunities recently presented to the emergency department with a left obstructing stone.  He underwent left ureteral stent placement and attempted right stent removal.  There was resistance and therefore right ureteral stent removal was aborted and he presents today for the above operations.  Description of procedure:  The patient was identified and consent was obtained.  The patient was taken to the operating room and placed in the supine position.  The patient was placed under general anesthesia.  Perioperative antibiotics were administered.  The patient was placed  in dorsal lithotomy.  Patient was prepped and draped in a standard sterile fashion and a timeout was performed.  A 21 French rigid cystoscope was advanced into the urethra and into the bladder.  Complete cystoscopy was performed with the findings noted above.  I attempted to pull the stent on the right out.  This came about halfway down the ureter and then met resistance.  I therefore passed a wire up the right ureter up to the kidney under fluoroscopic guidance.  I advanced a semirigid ureteroscope up the ureter alongside the stent and there was clear encrustation on the proximal portion of the stent at the curl.  Laser was used to fragment the encrustations.  I then reintroduced the cystoscope and was able to extract the stent.  I advanced the semirigid ureteroscope up the right ureter and performed a diagnostic ureteroscopy.  There was significant inflammation and edema but no evidence of any injury.  I shot a retrograde pyelogram through the scope with no evidence of ureteral extravasation.  I backloaded the wire onto a rigid cystoscope and advanced that into the bladder followed by routine placement of a 6 x 26 double-J ureteral stent.  Fluoroscopy confirmed proximal placement and direct visualization confirmed a good coil within the bladder.  I disassembled the scope keeping the string in proper place.  I then advanced the scope into the bladder and pulled the left-sided ureteral stent just beyond the urethral meatus.  A wire was advanced through the stent and into the kidney under fluoroscopic guidance and the stent was withdrawn.  Semirigid ureteroscopy was performed up to the level of the renal pelvis.  No ureteral calculi were seen.  I passed a second wire through the scope and into the kidney  and withdrew the scope.  This wire was used to advance a 12 x 14 ureteral access sheath over the wire under continuous fluoroscopic guidance up to the renal pelvis.  The inner sheath and wire were withdrawn.   Digital ureteroscopy identified 2 stones which were laser fragmented on dust settings to tiny fragments.  There were no other clinically significant stone fragments.  I shot a retrograde pyelogram through the scope with no abnormal findings.  I withdrew the scope along with the access sheath visualizing the entire ureter upon removal.  Again no ureteral calculi were seen.  No ureteral injury was identified.  I backloaded the wire onto rigid cystoscope and advanced that into the bladder followed by routine placement of a 6 x 26 double-J ureteral stent.  Fluoroscopy confirmed proximal placement and direct visualization confirmed a good coil in the bladder.  I disassembled the scope and carefully removed the scope.  The right-sided ureteral stent had been pulled down.  I attempted to pass a wire through the stent and passed a stent over the wire but there was resistance.  I therefore removed the stent entirely from the right.  I readvanced the scope into the bladder and advanced a wire up to the kidney under fluoroscopic guidance.  I then passed a 6 x 26 double-J ureteral stent over the wire in a routine fashion.  Fluoroscopy confirmed proximal placement and direct visualization confirmed a good coil within the bladder.  I disassembled the scope and again carefully remove the scope keeping the string in place.  I took spot fluoroscopic images of the kidney and bladder and again confirmed proper placement of bilateral stents.  This concluded the operation.  Patient tolerated the procedure well was stable postoperative.  The strings were secured down to his penis.  Plan: He will be instructed to remove his stents in 1 week.  Hopefully he does not remove the sooner.  Given his history, I did not feel like it was safe to leave a stent without a string.

## 2021-04-11 NOTE — Anesthesia Procedure Notes (Signed)
Procedure Name: LMA Insertion Date/Time: 04/11/2021 8:48 AM Performed by: Kizzie Fantasia, CRNA Pre-anesthesia Checklist: Patient identified, Emergency Drugs available, Suction available, Patient being monitored and Timeout performed Patient Re-evaluated:Patient Re-evaluated prior to induction Oxygen Delivery Method: Circle system utilized Preoxygenation: Pre-oxygenation with 100% oxygen Induction Type: IV induction Ventilation: Mask ventilation without difficulty LMA: LMA inserted LMA Size: 4.0 Number of attempts: 1 Placement Confirmation: positive ETCO2 and breath sounds checked- equal and bilateral Tube secured with: Tape Dental Injury: Teeth and Oropharynx as per pre-operative assessment

## 2021-04-11 NOTE — Anesthesia Postprocedure Evaluation (Deleted)
Anesthesia Post Note  Patient: Raymond Bowen  Procedure(s) Performed: CYSTOSCOPY, URETEROSCOPY, HOLMIUM LASER and STENT PLACEMENT (Bilateral)     Patient location during evaluation: PACU Anesthesia Type: General Level of consciousness: awake Pain management: pain level controlled Vital Signs Assessment: post-procedure vital signs reviewed and stable Respiratory status: spontaneous breathing Cardiovascular status: stable Postop Assessment: no apparent nausea or vomiting Anesthetic complications: no   No notable events documented.  Last Vitals:  Vitals:   04/11/21 1100 04/11/21 1115  BP: 136/87 122/90  Pulse: 84 97  Resp: 15 16  Temp: 36.4 C   SpO2: 100% 100%    Last Pain:  Vitals:   04/11/21 1100  TempSrc:   PainSc: Asleep                 Xaria Judon     

## 2021-04-12 ENCOUNTER — Other Ambulatory Visit: Payer: Self-pay

## 2021-04-12 ENCOUNTER — Encounter (HOSPITAL_COMMUNITY): Payer: Self-pay | Admitting: Urology

## 2021-04-12 MED ORDER — OXYCODONE HCL 5 MG PO TABS
5.0000 mg | ORAL_TABLET | Freq: Once | ORAL | Status: AC
  Administered 2021-04-12: 5 mg via ORAL
  Filled 2021-04-12: qty 1

## 2021-04-12 MED ORDER — TAMSULOSIN HCL 0.4 MG PO CAPS
0.4000 mg | ORAL_CAPSULE | Freq: Every day | ORAL | Status: DC
  Administered 2021-04-12 – 2021-04-14 (×3): 0.4 mg via ORAL
  Filled 2021-04-12 (×3): qty 1

## 2021-04-12 MED ORDER — TAMSULOSIN HCL 0.4 MG PO CAPS
0.4000 mg | ORAL_CAPSULE | Freq: Every day | ORAL | 1 refills | Status: DC
Start: 2021-04-12 — End: 2023-03-18

## 2021-04-12 NOTE — Plan of Care (Signed)
  Problem: Skin Integrity: Goal: Demonstration of wound healing without infection will improve Outcome: Progressing   Problem: Clinical Measurements: Goal: Postoperative complications will be avoided or minimized Outcome: Progressing   

## 2021-04-12 NOTE — Progress Notes (Signed)
Lengthy conversation with Dr. Benancio Deeds, on-call urology. Explained to him that patient is 10/10 pain and begging to have catheter placed d/t retention. Bladder scan showed 630. Verbal orders received to place foley. If symptoms completely resolve and patient feels okay with going home, he may do so, with the catheter. If not, he can stay tonight and been seen tomorrow. Also, spoke with on-call Janett Billow, NP and explained the whole situation. She agreed.

## 2021-04-12 NOTE — Telephone Encounter (Signed)
Ostomy supplies received from [facility].  Call placed to patient, who is currently hospitalized, and informed him that he can pick up the supplies at [facility] after discharge and he said okay.  He was moaning and in a lot of pain at the time of this call.   Alycia/Jay'a - the supplies are in a box on my desk.

## 2021-04-12 NOTE — Progress Notes (Signed)
Pt having trouble urinating, bladder scan performed and shows in bladder. MD on call notified, in and out cath performed. urine. Pt is feeling better, MD will see pt this AM.

## 2021-04-12 NOTE — Progress Notes (Signed)
Cking on pt, he c/o's of severe L flank pain. Both stents remain in place, pt states he needs to pee but cannot. Noted 3 different urine occurances listed on pt's I/O record. Bladder scan = 125 ml. MD notified of above, one time dose of Oxycodone 5 mg given.

## 2021-04-13 DIAGNOSIS — Z807 Family history of other malignant neoplasms of lymphoid, hematopoietic and related tissues: Secondary | ICD-10-CM | POA: Diagnosis not present

## 2021-04-13 DIAGNOSIS — N200 Calculus of kidney: Secondary | ICD-10-CM | POA: Diagnosis not present

## 2021-04-13 DIAGNOSIS — Z86718 Personal history of other venous thrombosis and embolism: Secondary | ICD-10-CM | POA: Diagnosis not present

## 2021-04-13 DIAGNOSIS — N390 Urinary tract infection, site not specified: Secondary | ICD-10-CM | POA: Diagnosis not present

## 2021-04-13 DIAGNOSIS — Z823 Family history of stroke: Secondary | ICD-10-CM | POA: Diagnosis not present

## 2021-04-13 DIAGNOSIS — Z79899 Other long term (current) drug therapy: Secondary | ICD-10-CM | POA: Diagnosis not present

## 2021-04-13 DIAGNOSIS — Z9114 Patient's other noncompliance with medication regimen: Secondary | ICD-10-CM | POA: Diagnosis not present

## 2021-04-13 DIAGNOSIS — N9989 Other postprocedural complications and disorders of genitourinary system: Secondary | ICD-10-CM | POA: Diagnosis not present

## 2021-04-13 DIAGNOSIS — N136 Pyonephrosis: Secondary | ICD-10-CM | POA: Diagnosis present

## 2021-04-13 DIAGNOSIS — Z832 Family history of diseases of the blood and blood-forming organs and certain disorders involving the immune mechanism: Secondary | ICD-10-CM | POA: Diagnosis not present

## 2021-04-13 DIAGNOSIS — R300 Dysuria: Secondary | ICD-10-CM | POA: Diagnosis present

## 2021-04-13 DIAGNOSIS — Z7901 Long term (current) use of anticoagulants: Secondary | ICD-10-CM | POA: Diagnosis not present

## 2021-04-13 DIAGNOSIS — G8918 Other acute postprocedural pain: Secondary | ICD-10-CM | POA: Diagnosis not present

## 2021-04-13 DIAGNOSIS — Z87891 Personal history of nicotine dependence: Secondary | ICD-10-CM | POA: Diagnosis not present

## 2021-04-13 DIAGNOSIS — N1832 Chronic kidney disease, stage 3b: Secondary | ICD-10-CM | POA: Diagnosis not present

## 2021-04-13 DIAGNOSIS — Z932 Ileostomy status: Secondary | ICD-10-CM | POA: Diagnosis not present

## 2021-04-13 DIAGNOSIS — N202 Calculus of kidney with calculus of ureter: Secondary | ICD-10-CM | POA: Diagnosis not present

## 2021-04-13 DIAGNOSIS — Z888 Allergy status to other drugs, medicaments and biological substances status: Secondary | ICD-10-CM | POA: Diagnosis not present

## 2021-04-13 DIAGNOSIS — A419 Sepsis, unspecified organism: Secondary | ICD-10-CM | POA: Diagnosis present

## 2021-04-13 DIAGNOSIS — R338 Other retention of urine: Secondary | ICD-10-CM | POA: Diagnosis not present

## 2021-04-13 DIAGNOSIS — M21371 Foot drop, right foot: Secondary | ICD-10-CM | POA: Diagnosis present

## 2021-04-13 DIAGNOSIS — F1510 Other stimulant abuse, uncomplicated: Secondary | ICD-10-CM | POA: Diagnosis present

## 2021-04-13 DIAGNOSIS — F1410 Cocaine abuse, uncomplicated: Secondary | ICD-10-CM | POA: Diagnosis present

## 2021-04-13 DIAGNOSIS — Z8249 Family history of ischemic heart disease and other diseases of the circulatory system: Secondary | ICD-10-CM | POA: Diagnosis not present

## 2021-04-13 DIAGNOSIS — Z91013 Allergy to seafood: Secondary | ICD-10-CM | POA: Diagnosis not present

## 2021-04-13 NOTE — Plan of Care (Signed)
  Problem: Education: Goal: Required Educational Video(s) Outcome: Progressing   Problem: Clinical Measurements: Goal: Postoperative complications will be avoided or minimized Outcome: Progressing   Problem: Skin Integrity: Goal: Demonstration of wound healing without infection will improve Outcome: Progressing   

## 2021-04-13 NOTE — Progress Notes (Signed)
Urology Inpatient Progress Report  Renal calculi [N20.0]  Procedure(s): CYSTOSCOPY, URETEROSCOPY, HOLMIUM LASER and STENT PLACEMENT  2 Days Post-Op   Intv/Subj: Patient unable to void last night and foley placed. Was also having penile pain and discomfort. His tape on the penis holding the strings looked tight so I released this and penis feels better. Nursing had a little difficulty placing foley but draining well now. Flomax started yesterday  Active Problems:   Renal calculi  Current Facility-Administered Medications  Medication Dose Route Frequency Provider Last Rate Last Admin   acetaminophen (TYLENOL) tablet 650 mg  650 mg Oral Q4H PRN Ray Church [name], MD       belladonna-opium (B&O) suppository 16.2-60mg   1 suppository Rectal Q6H PRN Ray Church [name], MD       diphenhydrAMINE (BENADRYL) injection 12.5-25 mg  12.5-25 mg Intravenous Q6H PRN Ray Church [name], MD       Or   diphenhydrAMINE (BENADRYL) 12.5 MG/5ML elixir 12.5-25 mg  12.5-25 mg Oral Q6H PRN Ray Church [name], MD       docusate sodium (COLACE) capsule 100 mg  100 mg Oral BID Ray Church [name], MD   100 mg at 04/12/21 2135   DULoxetine (CYMBALTA) DR capsule 60 mg  60 mg Oral Daily Ray Church [name], MD   60 mg at 04/12/21 0950   furosemide (LASIX) tablet 20 mg  20 mg Oral Daily Ray Church [name], MD   20 mg at 04/12/21 7035   HYDROmorphone (DILAUDID) injection 0.5-1 mg  0.5-1 mg Intravenous Q2H PRN Ray Church [name], MD   1 mg at 04/12/21 0093   menthol-cetylpyridinium (CEPACOL) lozenge 3 mg  1 lozenge Oral PRN Ray Church [name], MD       ondansetron [facility]) injection 4 mg  4 mg Intravenous Q4H PRN Ray Church [name], MD       oxybutynin (DITROPAN) tablet 5 mg  5 mg Oral Q8H PRN Ray Church [name], MD   5 mg at 04/12/21 1851   oxyCODONE (Oxy IR/ROXICODONE) immediate release tablet 5 mg  5 mg Oral Q4H PRN Ray Church [name], MD   5 mg at 04/12/21 2135   pregabalin (LYRICA) capsule 100 mg  100 mg Oral BID  Ray Church [name], MD   100 mg at 04/12/21 2135   tamsulosin (FLOMAX) capsule 0.4 mg  0.4 mg Oral Daily Ray Church [name], MD   0.4 mg at 04/12/21 0951   zolpidem (AMBIEN) tablet 5 mg  5 mg Oral QHS PRN,MR X 1 Ray Church [name], MD   5 mg at 04/12/21 0040     Objective: Vital: Vitals:   04/12/21 0520 04/12/21 1136 04/12/21 2144 04/13/21 0531  BP: (!) 143/95 124/84 123/72 117/69  Pulse: 85 88 65 64  Resp: 17 18 16 16   Temp: (!) 97.4 F (36.3 C) (!) 97.4 F (36.3 C)  (!) 97.5 F (36.4 C)  TempSrc: Oral   Oral  SpO2: 99% 100% 100% 100%  Height:   5\' 7"  (1.702 m)    I/Os: I/O last 3 completed shifts: In: 1680 [P.O.:1680] Out: 4050 [Urine:3150; Stool:900]  Physical Exam:  General: Patient is in no apparent distress Lungs: Normal respiratory effort, chest expands symmetrically. GI: The abdomen is soft and nontender without mass Foley: clear yellow urine  Ext: lower extremities symmetric  Lab Results: No results for input(s): WBC, HGB, HCT in the last 72 hours. No results for  input(s): NA, K, CL, CO2, GLUCOSE, BUN, CREATININE, CALCIUM in the last 72 hours. No results for input(s): LABPT, INR in the last 72 hours. No results for input(s): LABURIN in the last 72 hours. Results for orders placed or performed during the hospital encounter of 04/03/21  Urine Culture     Status: Abnormal   Collection Time: 04/04/21  2:35 AM   Specimen: Urine, Clean Catch  Result Value Ref Range Status   Specimen Description   Final    URINE, CLEAN CATCH Performed at [facility], [address] [place], [place] [address]    Special Requests   Final    NONE Performed at [facility], [address], [place], [place] [address]    Culture (A)  Final    <10,000 COLONIES/mL INSIGNIFICANT GROWTH Performed at [facility], [address], [place], [place] [address]    Report Status 04/05/2021 FINAL  Final    Studies/Results: DG C-Arm 1-60 Min-No  Report  Result Date: 04/11/2021 Fluoroscopy was utilized by the requesting physician.  No radiographic interpretation.    Assessment: Retained right ureteral stent  Left ureteral stone  Procedure(s): CYSTOSCOPY, URETEROSCOPY, HOLMIUM LASER and STENT PLACEMENT, 2 Days Post-Op  doing well.  Plan: Keep another night for observation. Difficult social situation. Will continue flomax and attempt void trial tomorrow am. Likely DC if able to void with instructions to pull stents Monday morning.   Modena Slater, MD Urology 04/13/2021, 8:19 AM

## 2021-04-14 ENCOUNTER — Other Ambulatory Visit: Payer: Self-pay

## 2021-04-14 ENCOUNTER — Encounter (HOSPITAL_COMMUNITY): Payer: Self-pay

## 2021-04-14 ENCOUNTER — Emergency Department (HOSPITAL_COMMUNITY)
Admission: EM | Admit: 2021-04-14 | Discharge: 2021-04-15 | Disposition: A | Discharge status: Home / Self Care | Payer: Medicare Other | Attending: Emergency Medicine | Admitting: Emergency Medicine

## 2021-04-14 DIAGNOSIS — R300 Dysuria: Secondary | ICD-10-CM | POA: Diagnosis present

## 2021-04-14 DIAGNOSIS — N1832 Chronic kidney disease, stage 3b: Secondary | ICD-10-CM | POA: Diagnosis not present

## 2021-04-14 DIAGNOSIS — Z87891 Personal history of nicotine dependence: Secondary | ICD-10-CM | POA: Insufficient documentation

## 2021-04-14 DIAGNOSIS — G8918 Other acute postprocedural pain: Secondary | ICD-10-CM | POA: Diagnosis not present

## 2021-04-14 DIAGNOSIS — N390 Urinary tract infection, site not specified: Secondary | ICD-10-CM | POA: Diagnosis not present

## 2021-04-14 DIAGNOSIS — R319 Hematuria, unspecified: Secondary | ICD-10-CM

## 2021-04-14 MED ORDER — MELOXICAM 15 MG PO TABS: 15.0000 mg | ORAL_TABLET | Freq: Every day | ORAL | 0 refills | Status: DC

## 2021-04-14 MED ORDER — ACETAMINOPHEN 325 MG PO TABS
650.0000 mg | ORAL_TABLET | Freq: Once | ORAL | Status: AC
  Administered 2021-04-14: 650 mg via ORAL
  Filled 2021-04-14: qty 2

## 2021-04-14 MED ORDER — CEPHALEXIN 500 MG PO CAPS: 500.0000 mg | ORAL_CAPSULE | Freq: Three times a day (TID) | ORAL | 0 refills | Status: AC

## 2021-04-14 NOTE — ED Triage Notes (Signed)
Pt had a procedure this past Tuesday where they placed stents in kidney due to kidney stones. Patient states that he's been having pain that's 10/10 in lower abdomen. Pt also has c/o dysuria, but states urine looks normal. Pt has no c/o fever, but endorsing gross pain with soreness.

## 2021-04-14 NOTE — Progress Notes (Signed)
Pt's foley cath removed at 1130 this am. Fluids pushed and pt instructed to save ALL urine in his urinal so that we can measure. Pt has continued to drink po's throughout afternoon but states he is unable to void. Standing outside of pt's door, NT heard pt urinating in the BR, and then flushing the toilet. When pt questioned, he denied that he voided any at all. When bladder scanner brought to pt's room, pt stated that "I have already been peeing . ." And refused bladder scan. Pt stated that he had no ride home - contacted [facility] and will provide transportation for him.
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
## 2021-04-14 NOTE — Progress Notes (Addendum)
[facility]  [employer].  [place] [phone]  DV Shelter   [facility] [address] [place], [place], [place] [address] [phone]  Men/Woman  [facility] [address]  [place] [phone]  DV Shelter  [facility] [phone] [address] [place] [phone]  Families' w/Children   [facility] [address] [place] [phone]  Men/Women/Families   [facility] [address] [place].  [place] [phone] Men and Women  [facility] [place] [place] [phone]    pregnant/parenting girls and women  [facility]  [place] [phone]  ages [address] [place] [phone]  Youth ages 11-17  [facility] [address] [place] [phone]  Men  [facility] [phone]  [place] [phone]  Women  [facility] [address]  [place] [phone]  Single Women and Women w/Children  [employer] [address]  [place] [phone] Men/Women/Families   [facility] [address]  [place] [phone]  DV Shelter  [facility] [address] [place]  [place] [phone] Men and Women  [employer] [address] [place] [place] [phone] Men  [facility] [address]  [place] [phone] Men  [facility] [address]  [place] [phone]  Single Women and Families  [facility]. [place] [phone] or  [phone]  Pregnant Women  [facility] [address]  [place]  [phone]  Men/Women and Families

## 2021-04-14 NOTE — Progress Notes (Signed)
Pt has some ambivalence about foley being removed.  Pt states "I do not want this foley removed until I speak with the Md first".  Will relay message to am nurse/care team.

## 2021-04-14 NOTE — Progress Notes (Signed)
Reviewed written d/c instructions w pt and all questions answered. He verbalized understanding. Provided pt a bus pass, d/c per w/c w all belongings in stable condition.

## 2021-04-14 NOTE — TOC Transition Note (Signed)
Transition of Care [facility]) - CM/SW Discharge Note  Patient Details  Name: Raymond Bowen MRN: [id number] Date of Birth: 03-28-1976  [facility]) CM/SW Contact:  Ewing Schlein, LCSW Phone Number: 04/14/2021, 10:00 AM  Clinical Narrative: [facility] consulted as patient is homeless and medication assistance was requested. However, patient has Medicaid and is not eligible for a MATCH voucher. Homeless shelter listed added to AVS and progress note. TOC signing off.  Final next level of care: Other (comment) (Patient is homeless) Barriers to Discharge: Barriers Unresolved (comment) (Patient is homeless.)  Patient Goals and CMS Choice Choice offered to / list presented to : NA  Discharge Plan and Services        DME Arranged: N/A HH Arranged: NA  Readmission Risk Interventions No flowsheet data found.

## 2021-04-15 LAB — URINALYSIS, ROUTINE W REFLEX MICROSCOPIC
Bilirubin Urine: NEGATIVE
Glucose, UA: NEGATIVE mg/dL
Ketones, ur: NEGATIVE mg/dL
Nitrite: NEGATIVE
Protein, ur: 100 mg/dL — AB
Specific Gravity, Urine: 1.015 (ref 1.005–1.030)
pH: 6 (ref 5.0–8.0)

## 2021-04-15 MED ORDER — KETOROLAC TROMETHAMINE 60 MG/2ML IM SOLN
30.0000 mg | Freq: Once | INTRAMUSCULAR | Status: AC
  Administered 2021-04-15: 30 mg via INTRAMUSCULAR
  Filled 2021-04-15: qty 2

## 2021-04-15 MED ORDER — CEFTRIAXONE SODIUM 1 G IJ SOLR
1.0000 g | Freq: Once | INTRAMUSCULAR | Status: AC
  Administered 2021-04-15: 1 g via INTRAMUSCULAR
  Filled 2021-04-15: qty 10

## 2021-04-15 MED ORDER — LIDOCAINE HCL (PF) 1 % IJ SOLN
INTRAMUSCULAR | Status: AC
  Administered 2021-04-15: 2.1 mL
  Filled 2021-04-15: qty 5

## 2021-04-15 MED ORDER — OXYCODONE-ACETAMINOPHEN 5-325 MG PO TABS
1.0000 | ORAL_TABLET | Freq: Once | ORAL | Status: AC
  Administered 2021-04-15: 1 via ORAL
  Filled 2021-04-15: qty 1

## 2021-04-15 MED ORDER — PHENAZOPYRIDINE HCL 100 MG PO TABS
95.0000 mg | ORAL_TABLET | Freq: Once | ORAL | Status: AC
  Administered 2021-04-15: 100 mg via ORAL
  Filled 2021-04-15: qty 1

## 2021-04-15 NOTE — ED Notes (Signed)
Discharge papers and care reviewed with patient. Patient expresses verbal understanding. Given colostomy bag and bus pas. Assisted to lobby via wheelchair.
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
## 2021-04-15 NOTE — Discharge Instructions (Addendum)
You were seen here today for post-op pain. Please rotate between Tylenol and ibuprofen for pain. Your Keflex, an antibiotic, from Dr. Marlou Porch was sent to [facility] on [address] Please take the antibiotic course as described and to completion. Stay well hydrated. Follow-up with urology. Return for any concern or new or worsening symptoms.

## 2021-04-15 NOTE — Care Management (Signed)
Patient is not eligible for medication assistance due to him having insurance. has had a difficult time procuring ostomy supplies, but they have called him about pickup at [facility].

## 2021-04-16 ENCOUNTER — Telehealth: Payer: Self-pay

## 2021-04-16 NOTE — Telephone Encounter (Signed)
Transition Care Management Follow-up Telephone Call  Met with the patient today when he came to the clinic to pick up ostomy supplies  Date of discharge and from where: 04/14/2021, [facility].  He then returned to [facility] ED after discharge 04/14/2021.  How have you been since you were released from the hospital? He said he is not sure what he is supposed to do about his stents. He did not have his discharge AVS from hospital or ED. Provided him with copies of both AVS. Reviewed the upcoming appointment schedule for stent removal 04/18/2021.  There was no time for the appointment on the AVS.  This CM called [facility] and confirmed that his appointment for stent removal is at 1000. Provided the patient with the appointment time and address of the clinic.  Any questions or concerns? Yes - noted above   Items Reviewed: Did the pt receive and understand the discharge instructions provided?  He did not have them with him.  Provided him with new copies.    Medications obtained and verified?  He said he has all of his medications including the new ones.  And did not have any questions about his med regime.  Other? No  Any new allergies since your discharge? No  Do you have support at home?  Currently homeless, staying with friends when he can. Last night he stayed on the street. He is on a wait list with [facility] and has been in contact with [employer] and [employer]. He does not want to go to [employer] because his medical care is in [place].  He has a monthly income but has not been able to find an affordable apartment.   Home Care and Equipment/Supplies: Were home health services ordered? no If so, what is the name of the agency? N/a  Has the agency set up a time to come to the patient's home? not applicable Were any new equipment or medical supplies ordered?  Yes: ostomy supplies What is the name of the medical supply agency? [facility] Were you able  to get the supplies/equipment? Yes - the supplies were delivered to [facility] and the patient took half of the supplies. The remainder he is leaving at [facility] to pick up when he needs them.  He does not have a permanent residence and it would be difficult for him to keep a month's worth of supplies secure.  Do you have any questions related to the use of the equipment or supplies? No  Functional Questionnaire: (I = Independent and D = Dependent) ADLs: independent   Follow up appointments reviewed:  PCP Hospital f/u appt confirmed? Yes  Scheduled to see Dr Margarita Rana 05/08/2021  [facility] f/u appt confirmed? Yes  Scheduled to see [facility] on 04/18/2021 @ 1000. Are transportation arrangements needed? No , provided him with some bus passes.  If their condition worsens, is the pt aware to call PCP or go to the Emergency Dept.? Yes Was the patient provided with contact information for the PCP's office or ED? Yes Was to pt encouraged to call back with questions or concerns? Yes

## 2021-04-16 NOTE — Telephone Encounter (Signed)
Met with the patient today when he came to the clinic to pick up ostomy supplies   He said he is not sure what he is supposed to do about his stents. He did not have his discharge AVS from hospital or ED. Provided him with copies of both AVS. Reviewed the upcoming appointment schedule for stent removal 04/18/2021.  There was no time for the appointment on the AVS.  This CM called [facility] and confirmed that his appointment for stent removal is at 1000. Provided the patient with the appointment time and address of the clinic. Provided him with bus passes.   He said he has all of his medications including the new ones.  And did not have any questions about his med regime.   Currently homeless, staying with friends when he can. Last night he stayed on the street. He is on a wait list with [facility] and has been in contact with [employer] and [employer]. He does not want to go to [employer] because his medical care is in [place].  He has a monthly income but has not been able to find an affordable apartment.   His ostomy the supplies were delivered to [facility] and the patient took half of the supplies. The remainder he is leaving at [facility] to pick up when he needs them.  He does not have a permanent residence and it would be difficult for him to keep a month's worth of supplies secure.  Scheduled to see Dr Margarita Rana 05/08/2021

## 2021-04-30 ENCOUNTER — Telehealth: Payer: Self-pay

## 2021-04-30 NOTE — Telephone Encounter (Signed)
Met with the patient when he was in the clinic this morning. He picked up the remainder of his ostomy supply order. This CM explained that it has not been 3 weeks since the supply was received and he said that he is not pleased with the current supplies and he has to change them frequently due to leaking.  He was in agreement to meeting with Karen Sanders, FNP WOC.  He said he is available for an appointment anytime except tomorrow morning. Confirmed the phone number where he can be reached # [phone]. Referral then faxed to the Ostomy Clinic. Instructed him to make sure that he returns his calls to the ostomy clinic.  He stated that he will and he is trying to avoid going to ED for ostomy supplies.   Provided him with bus passes. He said that he has to go to [employer] today to sign paperwork regarding his disability. He also noted that he has an appointment for a job interview tomorrow morning.   Call placed to [facility] # [phone] to inquire when patient would be due for next supply delivery. Message left with call back requested to this CM  

## 2021-05-03 NOTE — Telephone Encounter (Signed)
Met with the patient when he came to clinic this morning. He reported problems with his ostomy bag, skin irritation. He was using the last bag that he had from the [facility] delivery and he was not sure if the bag would last until his appointment with Domenic Moras, FNP tomorrow.   Call placed to Ms [facility], [place] with patient present. She explained to him that she will leave some supplies for him at the [employer] [facility] [facility] and he can pick them up today. She also confirmed her appointment with him tomorrow and re-assured him that she will continue to work with him to determine the best ostomy products for him. He said he will meet with her tomorrow - 05/04/2021 @ 1000. Provided him with bus passes.  He also called [facility] and requested an urgent delivery of supplies.  He said that Eldridge Abrahams is going to deliver the supplies to [facility].

## 2021-05-04 ENCOUNTER — Inpatient Hospital Stay (HOSPITAL_COMMUNITY): Admission: RE | Admit: 2021-05-04 | Payer: Medicaid Other | Source: Ambulatory Visit

## 2021-05-07 ENCOUNTER — Telehealth: Payer: Self-pay

## 2021-05-07 NOTE — Telephone Encounter (Signed)
This CM spoke to [facility] [employer].  # [phone] to inquire about patient's next supply delivery.  She said his insurance will not pay for supplies to be shipped until 05/10/2021. She confirmed address for delivery of products : [facility].    Call received from patient.  He said he is on his last ostomy bag. Inquired why he did not keep his appointment with Maple [place], FNP and he said he went to [facility] ED on 05/04/2021 because his leg was bothering him but he left because the wait was too long and then then he had to see his kids.  This CM reminded him that he was not pleased with his current supplies and the appointment with [name] is very important to address his ostomy product concerns. He was in agreement to seeing her again informed him that this CM will contact her to see if he can be re-scheduled. Confirmed his phone number. [place] informed him that this clinic has not received a delivery for him from [place]   This CM spoke to Maple [place], FNP  and explained above conversation with patient.  She said she would contact the patient and schedule an appointment.   Message received from Ms Allyne Gee, FNP stating that she spoke to patient and scheduled an appointment to see him tomorrow - 05/08/2021 @ 1200.  Call placed to patient because he also has appointment with Dr [name] tomorrow - 05/08/2021 @ 0930. He said he wants to keep that appointment and will then go to the appointment with Maple [place], FNP.

## 2021-05-08 ENCOUNTER — Ambulatory Visit: Payer: Medicaid Other | Admitting: Family Medicine

## 2021-05-08 ENCOUNTER — Ambulatory Visit (HOSPITAL_COMMUNITY)
Admission: RE | Admit: 2021-05-08 | Discharge: 2021-05-08 | Disposition: A | Discharge status: Home / Self Care | Payer: Medicare Other | Source: Ambulatory Visit | Attending: Nurse Practitioner | Admitting: Nurse Practitioner

## 2021-05-08 ENCOUNTER — Other Ambulatory Visit: Payer: Self-pay

## 2021-05-08 DIAGNOSIS — Z5900 Homelessness unspecified: Secondary | ICD-10-CM | POA: Insufficient documentation

## 2021-05-08 DIAGNOSIS — Z932 Ileostomy status: Secondary | ICD-10-CM | POA: Diagnosis not present

## 2021-05-08 DIAGNOSIS — K9413 Enterostomy malfunction: Secondary | ICD-10-CM | POA: Diagnosis not present

## 2021-05-08 DIAGNOSIS — L24B1 Irritant contact dermatitis related to digestive stoma or fistula: Secondary | ICD-10-CM

## 2021-05-08 DIAGNOSIS — Z5989 Other problems related to housing and economic circumstances: Secondary | ICD-10-CM | POA: Diagnosis not present

## 2021-05-09 ENCOUNTER — Telehealth: Payer: Self-pay | Admitting: Family Medicine

## 2021-05-09 ENCOUNTER — Ambulatory Visit: Payer: Medicare Other | Admitting: Family Medicine

## 2021-05-09 ENCOUNTER — Other Ambulatory Visit: Payer: Self-pay

## 2021-05-09 NOTE — Telephone Encounter (Signed)
Copied from CRM [id number]. Topic: General - Other >> May 07, 2021 12:33 PM Gaetana Michaelis A wrote: Reason for CRM: The patient would like to be contacted by Durene Cal about their ostomy supplies  The patient would like to be notified if/when the supplies are delivered   Please contact further

## 2021-05-09 NOTE — Telephone Encounter (Signed)
Call was placed to patient this morning informing him that ostomy bags were delivered today however there were no flanges with this order. It is most likely from his request to [facility].He  should still be receiving a supply order from [facility] at the end of this week/beginning of next week.  He said that he met with Domenic Moras, FNP and received some new supplies but he said those are not working very well.  He has a follow up appointment with with Ms Baird Cancer, [place] next week - 05/18/2021.

## 2021-05-09 NOTE — Telephone Encounter (Signed)
Call placed to patient and informed him that additional ostomy supplies have delivered to [facility] from [facility] as well as [facility]. He can come to the clinic at any time during business hours to pick up the supplies. He can take some supplies with him and leave the remainder at [facility] to pick up at a later time.
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
## 2021-05-11 ENCOUNTER — Other Ambulatory Visit: Payer: Self-pay | Admitting: Family Medicine

## 2021-05-11 DIAGNOSIS — M7989 Other specified soft tissue disorders: Secondary | ICD-10-CM

## 2021-05-11 DIAGNOSIS — I872 Venous insufficiency (chronic) (peripheral): Secondary | ICD-10-CM

## 2021-05-12 NOTE — Telephone Encounter (Signed)
Requested medications are due for refill today yes  Requested medications are on the active medication list yes, ? If prn or scheduled  Last refill 03/21/21  Last visit 03/29/21  Future visit scheduled NO, NO SHOW twice recently  Notes to clinic Has already had a curtesy refill and there is no upcoming appointment scheduled.  Requested Prescriptions  Pending Prescriptions Disp Refills   furosemide (LASIX) 20 MG tablet [Pharmacy Med Name: Furosemide 20 MG Oral Tablet] 30 tablet 0    Sig: TAKE 1 TABLET BY MOUTH ONCE DAILY NEEDED FOR LEG SWELLING. APPOINTMENT REQUIRED FOR FUTURE REFILLS     Cardiovascular:  Diuretics - Loop Failed - 05/11/2021  4:03 PM      Failed - K in normal range and within 360 days    Potassium  Date Value Ref Range Status  04/03/2021 3.2 (L) 3.5 - 5.1 mmol/L Final          Failed - Cr in normal range and within 360 days    Creatinine, Ser  Date Value Ref Range Status  04/03/2021 2.22 (H) 0.61 - 1.24 mg/dL Final   Creatinine, Urine  Date Value Ref Range Status  01/29/2017 450.25 mg/dL Final    Comment:    RESULTS CONFIRMED BY MANUAL DILUTION          Passed - Ca in normal range and within 360 days    Calcium  Date Value Ref Range Status  04/03/2021 9.6 8.9 - 10.3 mg/dL Final   Calcium, Ion  Date Value Ref Range Status  02/25/2021 1.09 (L) 1.15 - 1.40 mmol/L Final          Passed - Na in normal range and within 360 days    Sodium  Date Value Ref Range Status  04/03/2021 135 135 - 145 mmol/L Final  03/23/2019 142 134 - 144 mmol/L Final          Passed - Last BP in normal range    BP Readings from Last 1 Encounters:  05/08/21 139/79          Passed - Valid encounter within last 6 months    Recent Outpatient Visits           1 month ago 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

## 2021-05-14 ENCOUNTER — Telehealth: Payer: Self-pay

## 2021-05-14 ENCOUNTER — Ambulatory Visit: Payer: Medicare Other | Admitting: Family Medicine

## 2021-05-14 NOTE — Telephone Encounter (Signed)
Patient called in on 05/14/2021 requesting to pick up ostomy supplies for [facility]. 6 bags, barrier, tape. Patient asked for the supplies to be left up from for him.

## 2021-05-14 NOTE — Discharge Instructions (Signed)
Call clinic as needed

## 2021-05-15 NOTE — Telephone Encounter (Signed)
noted 

## 2021-05-16 ENCOUNTER — Telehealth: Payer: Self-pay

## 2021-05-16 NOTE — Telephone Encounter (Signed)
Call placed to patient because he did not pick up his ostomy supplies.  He said that he was not able to get to the clinic to pick them up but Raymond Bowen will pick them up for him today.

## 2021-05-18 ENCOUNTER — Ambulatory Visit (HOSPITAL_COMMUNITY): Payer: Medicaid Other

## 2021-05-18 ENCOUNTER — Other Ambulatory Visit: Payer: Self-pay | Admitting: Family Medicine

## 2021-05-18 DIAGNOSIS — M7989 Other specified soft tissue disorders: Secondary | ICD-10-CM

## 2021-05-18 DIAGNOSIS — I872 Venous insufficiency (chronic) (peripheral): Secondary | ICD-10-CM

## 2021-05-19 NOTE — Telephone Encounter (Signed)
Pt should have ample supply. LRF 9.8.22  #30  3 refills

## 2021-05-20 NOTE — Telephone Encounter (Signed)
Called pt and pt wants Med sent to [facility] on [address] Requested Prescriptions  Pending Prescriptions Disp Refills  . furosemide (LASIX) 20 MG tablet [Pharmacy Med Name: Furosemide 20 MG Oral Tablet] 90 tablet 0    Sig: TAKE 1 TABLET BY MOUTH ONCE DAILY NEEDED FOR LEG SWELLING. APPOINTMENT REQUIRED FOR FUTURE REFILLS     Cardiovascular:  Diuretics - Loop Failed - 05/18/2021  2:37 PM      Failed - K in normal range and within 360 days    Potassium  Date Value Ref Range Status  04/03/2021 3.2 (L) 3.5 - 5.1 mmol/L Final         Failed - Cr in normal range and within 360 days    Creatinine, Ser  Date Value Ref Range Status  04/03/2021 2.22 (H) 0.61 - 1.24 mg/dL Final   Creatinine, Urine  Date Value Ref Range Status  01/29/2017 450.25 mg/dL Final    Comment:    RESULTS CONFIRMED BY MANUAL DILUTION         Passed - Ca in normal range and within 360 days    Calcium  Date Value Ref Range Status  04/03/2021 9.6 8.9 - 10.3 mg/dL Final   Calcium, Ion  Date Value Ref Range Status  02/25/2021 1.09 (L) 1.15 - 1.40 mmol/L Final         Passed - Na in normal range and within 360 days    Sodium  Date Value Ref Range Status  04/03/2021 135 135 - 145 mmol/L Final  03/23/2019 142 134 - 144 mmol/L Final         Passed - Last BP in normal range    BP Readings from Last 1 Encounters:  05/08/21 139/79         Passed - Valid encounter within last 6 months    Recent Outpatient Visits          1 month ago 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

## 2021-05-21 ENCOUNTER — Telehealth: Payer: Self-pay

## 2021-05-21 NOTE — Telephone Encounter (Signed)
Patient came to the clinic this morning, requesting additional ostomy supplies. He said that the current ostomy pouches are not working for him,  He missed his appointment with Maple Hudson, FNP on 05/18/2021 but would like to schedule another appointment with her.   Provided him with additional ostomy bags, skin barriers and tape. Informed him that this CM will contact him after speaking with Ms Allyne Gee, FNP.   He said he is now living in a rooming house. Address updated in Epic    Attempted to contact patient as Ms Allyne Gee, FNP has appointment available 11/4 2022 @ 1100.  Voicemail not set up on his phone

## 2021-05-21 NOTE — Telephone Encounter (Signed)
Call placed to patient and informed him that Raymond Hudson, NP can meet with him on 05/25/2021 @ 1100 and he said that was fine he would be there.  Message sent to [name] informing her of above call with patient

## 2021-05-25 ENCOUNTER — Ambulatory Visit (HOSPITAL_COMMUNITY): Payer: Medicaid Other

## 2021-05-25 ENCOUNTER — Ambulatory Visit: Payer: Self-pay | Admitting: *Deleted

## 2021-05-25 ENCOUNTER — Other Ambulatory Visit: Payer: Self-pay

## 2021-05-25 NOTE — Telephone Encounter (Signed)
Pt hung up when checking with office/ couldn't reach anyone at [facility]/ pt called about needing additional ostomy bags, skin barriers and tape./ he was suppose to see an RN at the office for an appt today but he was advised that she wasn't in office / pt needs to know what to do to get these supplies asap / please advise       Called patient to review information regarding ostomy supplies. Patient did not answer phone and person on phone reports patient was gone to the store . Person on phone asking if patient picked up supplies. Reviewed with person on the phone I was unable to confirm patient has picked up supplies. Requesting person answering phone to leave message for patient to call clinic back at #[phone].

## 2021-05-25 NOTE — Telephone Encounter (Signed)
2nd attempt to contact patient . No answer, patient does not have voicemail box set up at this time.

## 2021-05-25 NOTE — Telephone Encounter (Signed)
3rd attempt to contact patient. Called [phone] no answer, unable to leave message, no voicemail box set up. Called [phone] as listed in chart, no answer, busy signal . Please advise patient regarding ostomy supplies if call returned.

## 2021-05-28 ENCOUNTER — Emergency Department (HOSPITAL_COMMUNITY): Payer: Medicare Other

## 2021-05-28 ENCOUNTER — Emergency Department (HOSPITAL_COMMUNITY)
Admission: EM | Admit: 2021-05-28 | Discharge: 2021-05-29 | Disposition: A | Discharge status: Home / Self Care | Payer: Medicare Other | Attending: Emergency Medicine | Admitting: Emergency Medicine

## 2021-05-28 ENCOUNTER — Telehealth: Payer: Self-pay

## 2021-05-28 ENCOUNTER — Encounter (HOSPITAL_COMMUNITY): Payer: Self-pay | Admitting: Radiology

## 2021-05-28 DIAGNOSIS — R1111 Vomiting without nausea: Secondary | ICD-10-CM | POA: Diagnosis not present

## 2021-05-28 DIAGNOSIS — R109 Unspecified abdominal pain: Secondary | ICD-10-CM | POA: Diagnosis not present

## 2021-05-28 DIAGNOSIS — Z5321 Procedure and treatment not carried out due to patient leaving prior to being seen by health care provider: Secondary | ICD-10-CM | POA: Insufficient documentation

## 2021-05-28 DIAGNOSIS — R1110 Vomiting, unspecified: Secondary | ICD-10-CM | POA: Insufficient documentation

## 2021-05-28 DIAGNOSIS — R110 Nausea: Secondary | ICD-10-CM | POA: Diagnosis not present

## 2021-05-28 DIAGNOSIS — R300 Dysuria: Secondary | ICD-10-CM | POA: Diagnosis not present

## 2021-05-28 DIAGNOSIS — N200 Calculus of kidney: Secondary | ICD-10-CM | POA: Insufficient documentation

## 2021-05-28 DIAGNOSIS — K6389 Other specified diseases of intestine: Secondary | ICD-10-CM | POA: Diagnosis not present

## 2021-05-28 DIAGNOSIS — R112 Nausea with vomiting, unspecified: Secondary | ICD-10-CM | POA: Diagnosis not present

## 2021-05-28 DIAGNOSIS — R319 Hematuria, unspecified: Secondary | ICD-10-CM | POA: Diagnosis not present

## 2021-05-28 DIAGNOSIS — I700 Atherosclerosis of aorta: Secondary | ICD-10-CM | POA: Diagnosis not present

## 2021-05-28 LAB — CBC WITH DIFFERENTIAL/PLATELET
Abs Immature Granulocytes: 0.09 10*3/uL — ABNORMAL HIGH (ref 0.00–0.07)
Basophils Absolute: 0 10*3/uL (ref 0.0–0.1)
Basophils Relative: 0 %
Eosinophils Absolute: 0.2 10*3/uL (ref 0.0–0.5)
Eosinophils Relative: 1 %
HCT: 42.3 % (ref 39.0–52.0)
Hemoglobin: 13.1 g/dL (ref 13.0–17.0)
Immature Granulocytes: 1 %
Lymphocytes Relative: 14 %
Lymphs Abs: 1.9 10*3/uL (ref 0.7–4.0)
MCH: 25.9 pg — ABNORMAL LOW (ref 26.0–34.0)
MCHC: 31 g/dL (ref 30.0–36.0)
MCV: 83.8 fL (ref 80.0–100.0)
Monocytes Absolute: 1.5 10*3/uL — ABNORMAL HIGH (ref 0.1–1.0)
Monocytes Relative: 11 %
Neutro Abs: 9.8 10*3/uL — ABNORMAL HIGH (ref 1.7–7.7)
Neutrophils Relative %: 73 %
Platelets: 469 10*3/uL — ABNORMAL HIGH (ref 150–400)
RBC: 5.05 MIL/uL (ref 4.22–5.81)
RDW: 14.8 % (ref 11.5–15.5)
WBC: 13.4 10*3/uL — ABNORMAL HIGH (ref 4.0–10.5)
nRBC: 0 % (ref 0.0–0.2)

## 2021-05-28 LAB — COMPREHENSIVE METABOLIC PANEL
ALT: 28 U/L (ref 0–44)
AST: 22 U/L (ref 15–41)
Albumin: 4 g/dL (ref 3.5–5.0)
Alkaline Phosphatase: 110 U/L (ref 38–126)
Anion gap: 9 (ref 5–15)
BUN: 27 mg/dL — ABNORMAL HIGH (ref 6–20)
CO2: 20 mmol/L — ABNORMAL LOW (ref 22–32)
Calcium: 9.8 mg/dL (ref 8.9–10.3)
Chloride: 103 mmol/L (ref 98–111)
Creatinine, Ser: 2 mg/dL — ABNORMAL HIGH (ref 0.61–1.24)
GFR, Estimated: 42 mL/min — ABNORMAL LOW (ref >60)
Glucose, Bld: 104 mg/dL — ABNORMAL HIGH (ref 70–99)
Potassium: 3.3 mmol/L — ABNORMAL LOW (ref 3.5–5.1)
Sodium: 132 mmol/L — ABNORMAL LOW (ref 135–145)
Total Bilirubin: 0.4 mg/dL (ref 0.3–1.2)
Total Protein: 9.3 g/dL — ABNORMAL HIGH (ref 6.5–8.1)

## 2021-05-28 LAB — LIPASE, BLOOD: Lipase: 27 U/L (ref 11–51)

## 2021-05-28 MED ORDER — ONDANSETRON 4 MG PO TBDP
4.0000 mg | ORAL_TABLET | Freq: Once | ORAL | Status: AC
  Administered 2021-05-28: 4 mg via ORAL
  Filled 2021-05-28: qty 1

## 2021-05-28 MED ORDER — OXYCODONE-ACETAMINOPHEN 5-325 MG PO TABS
1.0000 | ORAL_TABLET | Freq: Once | ORAL | Status: AC
  Administered 2021-05-28: 1 via ORAL
  Filled 2021-05-28: qty 1

## 2021-05-28 NOTE — Telephone Encounter (Signed)
Attempted to contact patient about his ostomy supplies.  Call placed to # [phone] and the phone rings fast busy.  Patient picked up ostomy supplies at [facility] on 05/25/2021.  He was a now show that day for his appointment with Maple Hudson, FNP/Ostomy Clinic.

## 2021-05-28 NOTE — ED Triage Notes (Signed)
Ems brings pt in for abdominal pain. Pt reports having a stent in kidney for a kidney stone. Pt supposed to see urology today. Pt reports abdominal pain and vomiting yesterday.

## 2021-05-28 NOTE — ED Provider Notes (Signed)
Emergency Medicine Provider Triage Evaluation Note  Raymond Bowen , a 45 y.o. male  was evaluated in triage.  Pt complains of left flank pain.  Patient reports pain, nausea, and vomiting since yesterday.  Patient endorses vomiting twice in the last 24 hours.  Describes emesis as bilious.  Additionally patient complains of dysuria, hematuria, and urinary urgency.  Patient reports that he had a stent placed for a kidney stone, has follow-up with urology on 11/14.  Review of Systems  Positive: Dysuria, hematuria, urinary urgency, flank pain, nausea, vomiting Negative: Fever, chills  Physical Exam  BP 115/80 (BP Location: Left Arm)   Pulse (!) 101   Temp 98.3 F (36.8 C) (Oral)   Resp 18   Ht 5\' 8"  (1.727 m)   Wt 79.4 kg   SpO2 98%   BMI 26.61 kg/m  Gen:   Awake, no distress   Resp:  Normal effort  MSK:   Moves extremities without difficulty  Other:  Abdomen soft, nondistended, tenderness to left flank.  Positive left CVA tenderness.  No guarding or rebound tenderness.  Ostomy to abdomen.  Medical Decision Making  Medically screening exam initiated at 11:31 AM.  Appropriate orders placed.  Raymond Bowen was informed that the remainder of the evaluation will be completed by another provider, this initial triage assessment does not replace that evaluation, and the importance of remaining in the ED until their evaluation is complete.     [name], PA-C 05/28/21 1133    13/07/22, MD 05/28/21 1246

## 2021-05-31 ENCOUNTER — Telehealth (INDEPENDENT_AMBULATORY_CARE_PROVIDER_SITE_OTHER): Payer: Self-pay

## 2021-05-31 NOTE — Telephone Encounter (Signed)
Copied from CRM [id number]. Topic: General - Other >> May 31, 2021  7:55 AM Jaquita Rector A wrote: Reason for CRM: Patient called in to inform Erskine Squibb that he was coming in to pick up some supplies. Please advise

## 2021-05-31 NOTE — Telephone Encounter (Signed)
Call returned to patient. He picked up ostomy supplies this morning. He would like to meet with Maple Hudson, FNP again and will call this CM to discuss after he has urology appointment next week 06/04/2021.

## 2021-06-04 ENCOUNTER — Telehealth: Payer: Self-pay

## 2021-06-04 DIAGNOSIS — N202 Calculus of kidney with calculus of ureter: Secondary | ICD-10-CM | POA: Diagnosis not present

## 2021-06-04 DIAGNOSIS — N200 Calculus of kidney: Secondary | ICD-10-CM | POA: Diagnosis not present

## 2021-06-04 NOTE — Telephone Encounter (Signed)
Patient came to clinic this morning and picked  up some of his ostomy supplies.   He said he has an appointment with the urologist this afternoon, he has transportation to the appointment and said he was definitely going.  He is requesting to see Maple Hudson, FNP again despite his no shows.  He said he left a message for [name]. This CM also send email to [name] informing her that the patient would like to meet with her again.

## 2021-06-06 NOTE — Telephone Encounter (Signed)
Raymond Hudson, FNP will be out of the ostomy clinic until 06/25/2021.  Will need to discuss scheduling another appointment for patient after she returns

## 2021-06-08 ENCOUNTER — Telehealth: Payer: Self-pay

## 2021-06-08 NOTE — Telephone Encounter (Signed)
Call placed to patient and informed him that Maple Hudson FNP will be out of the ostomy clinic until 12/5 2022 and he will need to schedule a follow up appointment with her when she returns. He said that he understood and also said that Jerrell Belfast was supposed to stop at [facility] today to pick up supplies for him.

## 2021-06-08 NOTE — Telephone Encounter (Signed)
Supplies has been picked up, he was given 1 box of each.

## 2021-06-09 ENCOUNTER — Emergency Department (HOSPITAL_COMMUNITY)
Admission: EM | Admit: 2021-06-09 | Discharge: 2021-06-10 | Discharge status: Against Medical Advice | Payer: Medicaid Other

## 2021-06-10 ENCOUNTER — Emergency Department (HOSPITAL_COMMUNITY): Payer: Medicare Other

## 2021-06-10 ENCOUNTER — Emergency Department (HOSPITAL_COMMUNITY)
Admission: EM | Admit: 2021-06-10 | Discharge: 2021-06-11 | Disposition: A | Discharge status: Home / Self Care | Payer: Medicare Other | Attending: Emergency Medicine | Admitting: Emergency Medicine

## 2021-06-10 DIAGNOSIS — S8991XA Unspecified injury of right lower leg, initial encounter: Secondary | ICD-10-CM | POA: Diagnosis present

## 2021-06-10 DIAGNOSIS — Z79899 Other long term (current) drug therapy: Secondary | ICD-10-CM | POA: Diagnosis not present

## 2021-06-10 DIAGNOSIS — S8011XA Contusion of right lower leg, initial encounter: Secondary | ICD-10-CM | POA: Insufficient documentation

## 2021-06-10 DIAGNOSIS — R030 Elevated blood-pressure reading, without diagnosis of hypertension: Secondary | ICD-10-CM | POA: Diagnosis not present

## 2021-06-10 DIAGNOSIS — Z7901 Long term (current) use of anticoagulants: Secondary | ICD-10-CM | POA: Diagnosis not present

## 2021-06-10 DIAGNOSIS — Y9301 Activity, walking, marching and hiking: Secondary | ICD-10-CM | POA: Insufficient documentation

## 2021-06-10 DIAGNOSIS — W19XXXA Unspecified fall, initial encounter: Secondary | ICD-10-CM | POA: Diagnosis not present

## 2021-06-10 DIAGNOSIS — D631 Anemia in chronic kidney disease: Secondary | ICD-10-CM | POA: Diagnosis not present

## 2021-06-10 DIAGNOSIS — N1832 Chronic kidney disease, stage 3b: Secondary | ICD-10-CM | POA: Diagnosis not present

## 2021-06-10 DIAGNOSIS — Z87891 Personal history of nicotine dependence: Secondary | ICD-10-CM | POA: Diagnosis not present

## 2021-06-10 DIAGNOSIS — M7989 Other specified soft tissue disorders: Secondary | ICD-10-CM | POA: Diagnosis not present

## 2021-06-10 DIAGNOSIS — M79604 Pain in right leg: Secondary | ICD-10-CM | POA: Diagnosis not present

## 2021-06-10 DIAGNOSIS — R609 Edema, unspecified: Secondary | ICD-10-CM | POA: Diagnosis not present

## 2021-06-10 DIAGNOSIS — I10 Essential (primary) hypertension: Secondary | ICD-10-CM | POA: Diagnosis not present

## 2021-06-10 MED ORDER — ACETAMINOPHEN 325 MG PO TABS
650.0000 mg | ORAL_TABLET | Freq: Once | ORAL | Status: AC
  Administered 2021-06-10: 650 mg via ORAL
  Filled 2021-06-10: qty 2

## 2021-06-10 NOTE — ED Triage Notes (Signed)
EMS arrival. Cx right sided leg pain. Mainly knee. Hx of blood clots. Busted colostomy bag. Fell while walking. Effected bag.
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
## 2021-06-10 NOTE — Telephone Encounter (Signed)
noted 

## 2021-06-10 NOTE — ED Triage Notes (Addendum)
Patient states he needs a colostomy bag, states he does not have a medical complaint and does not want to stay to be seen

## 2021-06-11 ENCOUNTER — Telehealth: Payer: Self-pay

## 2021-06-11 NOTE — Discharge Instructions (Signed)
Apply ice for 30 minutes at a time, 4 times a day.  Take acetaminophen as needed for pain.  Do not take ibuprofen, naproxen, or any other nonsteroidal anti-inflammatory agent since those can promote bleeding and someone was taking a blood thinner.  Your blood pressure was high today.  Please have it rechecked several times in the next 2 weeks.  If it is staying consistently high, you will need to be on medication to control it.  Inadequately controlled high blood pressure can lead to heart attacks, strokes, kidney failure.

## 2021-06-11 NOTE — Telephone Encounter (Signed)
Patient came to clinic today stating that he needed more ostomy bags despite being given a box of ostomy bags on 06/08/2021.  He said that they are not staying on and they irritate the peristomal area..  Explained to him that Maple Hudson, FNP is out of the ostomy clinic until 06/25/2021. The patient said he also needed ostomy paste but [facility] does not have any.  He then said that tincture of benzoin has helped to adhere the bag without irritation to his skin.  Explained to him that this clinic does not have any tincture of benzoin at this time but Primary Care at [facility] may have some but we will need to check.  Provided the patient with the last 3 ostomy bags from his delivery.   Call placed to [facility] Medical Supply # [phone],spoke to [facility] who said that the patient is eligible for refill of his supplies, she will send out expedited shipment as patient is out of supplies at this time. She confirmed that the supplies will be shipped to [facility] - [address] [place], [place].  This information was shared with  the patient.    He changed his ostomy bag and left the clinic before this CM could tell him that Primary Care at [facility] has tincture of benzoin that he can pick up  there.   Attempted to contact patient by phone and his voice mail was not set up.

## 2021-06-13 ENCOUNTER — Telehealth: Payer: Self-pay

## 2021-06-13 NOTE — Telephone Encounter (Signed)
Patient called checking on status of ostomy supplies

## 2021-06-13 NOTE — Telephone Encounter (Addendum)
Attempted to contact patient three times  this afternoon  # [phone] to inform him that we have tincture of benzoin swabs in clinic however, the ostomy supply delivery has not yet arrived. Voicemail not set up, unable to leave a message  Call placed to [facility], spoke to [facility] who said that the supply order was shipped yesterday and should be delivered by 1125/2022 however this clinic will be closed until 06/18/2021

## 2021-06-14 ENCOUNTER — Emergency Department (HOSPITAL_COMMUNITY)
Admission: EM | Admit: 2021-06-14 | Discharge: 2021-06-14 | Disposition: A | Discharge status: Home / Self Care | Payer: Medicare Other | Attending: Emergency Medicine | Admitting: Emergency Medicine

## 2021-06-14 ENCOUNTER — Encounter (HOSPITAL_COMMUNITY): Payer: Self-pay

## 2021-06-14 ENCOUNTER — Other Ambulatory Visit: Payer: Self-pay

## 2021-06-14 DIAGNOSIS — Z87891 Personal history of nicotine dependence: Secondary | ICD-10-CM | POA: Diagnosis not present

## 2021-06-14 DIAGNOSIS — Z7901 Long term (current) use of anticoagulants: Secondary | ICD-10-CM | POA: Insufficient documentation

## 2021-06-14 DIAGNOSIS — N1832 Chronic kidney disease, stage 3b: Secondary | ICD-10-CM | POA: Diagnosis not present

## 2021-06-14 DIAGNOSIS — R0902 Hypoxemia: Secondary | ICD-10-CM | POA: Diagnosis not present

## 2021-06-14 DIAGNOSIS — Z432 Encounter for attention to ileostomy: Secondary | ICD-10-CM | POA: Diagnosis not present
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
## 2021-06-14 NOTE — ED Notes (Signed)
Pt provided ostomy supplies.
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
## 2021-06-15 ENCOUNTER — Ambulatory Visit: Payer: Self-pay

## 2021-06-15 NOTE — Telephone Encounter (Addendum)
Pt. Calling , states he is out of ostomy supplies. Went to ED yesterday "and they wouldn't give me any." The practice will be open Monday. States his supplies were mailed to the practice. Pt. States he will contact ostomy nurse at [facility].    Answer Assessment - Initial Assessment Questions 1. TYPE: "What type of ostomy do you have?" (e.g., ileostomy, colostomy; temporary, long-term [permanent]).     ileostomy 2. LOCATION: "Where is the ostomy located?"     N/a 3. DURATION: "How long have you had the ostomy?" (e.g., days, weeks, months, years).     N/a 4. MAIN CONCERN: "What is your main concern or symptom today?" (e.g., skin redness or irritation, bleeding, leaking, change in output or appearance of ostomy).     Does not have supplies 5. DAILY OSTOMY OUTPUT DIARY: "Do you keep a daily diary of the output from your ostomy"?      N/a 6. OSTOMY OUTPUT: "How much stool output over a 24 hour period are you having?" (e.g.,  ml; consistency of water, milkshake, pudding)     N/a 7. OTHER SYMPTOMS: "Are you having any other symptoms?" (e.g., fever, vomiting, blood in stool, abdominal pain, dizziness)     N/a  Protocols used: Ostomy Symptoms and Questions-A-AH

## 2021-06-15 NOTE — Telephone Encounter (Addendum)
Patient called upset about his ostomy supplies saying he's at [facility] now and all he needs is the small bag to go over the base and he's leaking everywhere. He says they have tried to contact the nurse to get the supplies but have not been able to reach them. I advised to stay there for help because the office is closed and the hospital is the best place to get help for supplies due the office is closed. Patient said why am I speaking to you then, he hung up the phone.

## 2021-06-16 ENCOUNTER — Emergency Department (HOSPITAL_COMMUNITY)
Admission: EM | Admit: 2021-06-16 | Discharge: 2021-06-16 | Disposition: A | Discharge status: Home / Self Care | Payer: Medicare Other | Attending: Emergency Medicine | Admitting: Emergency Medicine

## 2021-06-16 ENCOUNTER — Other Ambulatory Visit: Payer: Self-pay

## 2021-06-16 DIAGNOSIS — N1832 Chronic kidney disease, stage 3b: Secondary | ICD-10-CM | POA: Insufficient documentation

## 2021-06-16 DIAGNOSIS — R1084 Generalized abdominal pain: Secondary | ICD-10-CM | POA: Diagnosis not present

## 2021-06-16 DIAGNOSIS — Z7901 Long term (current) use of anticoagulants: Secondary | ICD-10-CM | POA: Insufficient documentation

## 2021-06-16 DIAGNOSIS — Z87891 Personal history of nicotine dependence: Secondary | ICD-10-CM | POA: Insufficient documentation

## 2021-06-16 DIAGNOSIS — Z432 Encounter for attention to ileostomy: Secondary | ICD-10-CM | POA: Diagnosis not present
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
## 2021-06-16 NOTE — ED Notes (Signed)
Pt given colostomy supplies. This RN helped apply new colostomy bag to pt's abdomen. Pt given a 2nd bag to take home. This RN informed PA Misty Stanley, who stated she would put pt up for discharge.

## 2021-06-16 NOTE — Discharge Instructions (Signed)
Continue to follow-up with wellness clinic for supplies.

## 2021-06-16 NOTE — ED Triage Notes (Signed)
Pt c/o right leg pain and drainage from his colostomy bag.

## 2021-06-17 ENCOUNTER — Other Ambulatory Visit: Payer: Self-pay

## 2021-06-17 ENCOUNTER — Encounter (HOSPITAL_COMMUNITY): Payer: Self-pay | Admitting: *Deleted

## 2021-06-17 ENCOUNTER — Emergency Department (HOSPITAL_COMMUNITY)
Admission: EM | Admit: 2021-06-17 | Discharge: 2021-06-18 | Disposition: A | Discharge status: Home / Self Care | Payer: Medicare Other | Attending: Emergency Medicine | Admitting: Emergency Medicine

## 2021-06-17 DIAGNOSIS — Z87891 Personal history of nicotine dependence: Secondary | ICD-10-CM | POA: Diagnosis not present

## 2021-06-17 DIAGNOSIS — Z432 Encounter for attention to ileostomy: Secondary | ICD-10-CM | POA: Insufficient documentation

## 2021-06-17 DIAGNOSIS — Z7901 Long term (current) use of anticoagulants: Secondary | ICD-10-CM | POA: Insufficient documentation

## 2021-06-17 DIAGNOSIS — R109 Unspecified abdominal pain: Secondary | ICD-10-CM | POA: Diagnosis not present

## 2021-06-17 DIAGNOSIS — N1832 Chronic kidney disease, stage 3b: Secondary | ICD-10-CM | POA: Diagnosis not present

## 2021-06-17 NOTE — ED Provider Notes (Signed)
Emergency Medicine Provider Triage Evaluation Note  Raymond Bowen , a 45 y.o. male  was evaluated in triage.  Pt complains of an ostomy bag supplies.  Was seen yesterday for similar.  He is frequently seen for similar complaints.  States his bag is leaking.  No systemic symptoms.  No change in stool.  No redness, warmth, pain.  They did not unable to get his home ostomy supplies due to the holiday initially but was delayed  Review of Systems  Positive: Requesting ostomy supplies Negative:   Physical Exam  There were no vitals taken for this visit. Gen:   Awake, no distress   Resp:  Normal effort  ABD:  Ostomy bag with yellow stool, healed multiple abdominal incisions with midline hernia.  Leaking ostomy bag MSK:   Moves extremities without difficulty  Other:    Medical Decision Making  Medically screening exam initiated at 7:28 PM.  Appropriate orders placed.  Raymond Bowen was informed that the remainder of the evaluation will be completed by another provider, this initial triage assessment does not replace that evaluation, and the importance of remaining in the ED until their evaluation is complete.  Ostomy bag supplies   Fitz Matsuo A, PA-C 06/17/21 1929    Terald Sleeper, MD 06/17/21 2108

## 2021-06-17 NOTE — ED Notes (Addendum)
Patient is repeatedly requesting ostomy bag in waiting room. Asked secretary to see if they had any availavble and was informed that there is not one at this time. Patient is back out in the waiting room.

## 2021-06-17 NOTE — ED Triage Notes (Signed)
The  pt has an ostomy and hes here  to get his supplies.  We have none  in the triage area  he frequents the ed with the same complaint

## 2021-06-18 ENCOUNTER — Telehealth: Payer: Self-pay

## 2021-06-18 NOTE — Telephone Encounter (Signed)
Attempted to contact the patient to inform him that his ostomy supplies from [facility] have been delivered. Call placed to # [phone] twice and the voicemail is not set up , unable to leave a message

## 2021-06-18 NOTE — ED Notes (Signed)
Called Raymond Bowen to order colostomy bags as our location does not have any in stock at the moment. Security on their way to pick up materials.
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
## 2021-06-18 NOTE — Discharge Instructions (Signed)
Follow-up with your primary care doctor.  They need to arrange to get you supplies.

## 2021-06-18 NOTE — Telephone Encounter (Signed)
Call placed to patient and informed him that his ostomy supplies were delivered today.  He said he has an ostomy bag on and will plan to come tomorrow to pick them up.  This CM stressed the importance of seeing Raymond Hudson, FNP/ostomy nurse when she returns to the clinic. He needs to find a bag that will stay on as he has been using at least one bag/day.

## 2021-06-18 NOTE — ED Notes (Signed)
PT given ostomy bag.  Pt refused vitals, requested and was given Happy meal. Pt walked out.

## 2021-06-19 ENCOUNTER — Telehealth: Payer: Self-pay | Admitting: Family Medicine

## 2021-06-19 DIAGNOSIS — Z1152 Encounter for screening for COVID-19: Secondary | ICD-10-CM | POA: Diagnosis not present

## 2021-06-19 NOTE — Telephone Encounter (Signed)
Letter stating patient met with me at [facility] this morning was faxed as patient requested.  Attempted multiple times to contact patient to inform him letter was faxed but his voicemail is not set up.  He was given a copy of the same letter when he was at the clinic this morning.

## 2021-06-19 NOTE — Telephone Encounter (Signed)
Met with the patient when he came to the clinic this morning to pick up supplies.  Provided him with bags, skin barriers and ostomy paste.  He said that the paste is the only thing that keeps the bags on, so he should not need to be changing them every day. He is aware that he should be coming to this clinic to get his supplies that have been ordered in stead of going to ED.  However it is important for him to meet with Domenic Moras, FNP/ostomy clinic to re-assess the supplies that he has been using because they are not working for him.   He also noted that he is leaving his current residence and is waiting for availability at another rooming house.   Provided him with food as well as a letter stating he was in the clinic this morning.

## 2021-06-19 NOTE — Telephone Encounter (Signed)
Copied from CRM [id number]. Topic: General - Inquiry >> Jun 19, 2021 10:34 AM Aretta Nip wrote:  Ronna Polio Fax [phone]  re: [employer] office pls faxed the letter that was just discussed today ASAP

## 2021-06-20 ENCOUNTER — Encounter (HOSPITAL_COMMUNITY): Payer: Self-pay | Admitting: Emergency Medicine

## 2021-06-20 ENCOUNTER — Other Ambulatory Visit: Payer: Self-pay

## 2021-06-20 ENCOUNTER — Emergency Department (HOSPITAL_COMMUNITY)
Admission: EM | Admit: 2021-06-20 | Discharge: 2021-06-20 | Disposition: A | Discharge status: Home / Self Care | Payer: Medicare Other | Attending: Emergency Medicine | Admitting: Emergency Medicine

## 2021-06-20 DIAGNOSIS — Z5321 Procedure and treatment not carried out due to patient leaving prior to being seen by health care provider: Secondary | ICD-10-CM | POA: Insufficient documentation

## 2021-06-20 DIAGNOSIS — K9403 Colostomy malfunction: Secondary | ICD-10-CM | POA: Diagnosis not present

## 2021-06-20 DIAGNOSIS — K9400 Colostomy complication, unspecified: Secondary | ICD-10-CM | POA: Diagnosis not present

## 2021-06-20 NOTE — ED Notes (Signed)
Pt refused to sign MSE 

## 2021-06-20 NOTE — ED Notes (Signed)
Called pt x3 for room, no response. 

## 2021-06-20 NOTE — ED Triage Notes (Signed)
Pt states he needs colostomy bag.  Pt is being rude and very disrespect full yelling at staff saying he needs supplies. When he was told that he was given supplies yesterday in the clinic he got angry.

## 2021-06-20 NOTE — ED Provider Notes (Signed)
Emergency Medicine Provider Triage Evaluation Note  Raymond Bowen , a 45 y.o. male  was evaluated in triage.  Pt complains of leaking ostomy bag. Frequently utilizes the ED to obtain ostomy supplies. Per chart review, received "bags, skin barriers and ostomy paste" at the clinic yesterday. Patient alleges he was only given 2 bags and he has needed to use them all because they "won't stick". Checked in to the ED after he was found sleeping in the lobby without an arm band present and was asked to leave.   Review of Systems  Positive: As above Negative: As above  Physical Exam  BP 112/80 (BP Location: Left Arm)   Pulse 74   Temp 98.4 F (36.9 C) (Oral)   Resp 17   SpO2 100%  Gen:   Awake, no distress   Resp:  Normal effort  MSK:   Moves extremities without difficulty  Other:  Agitated, confrontational  Medical Decision Making  Medically screening exam initiated at 2:04 AM.  Appropriate orders placed.  Kennieth Plotts was informed that the remainder of the evaluation will be completed by another provider, this initial triage assessment does not replace that evaluation, and the importance of remaining in the ED until their evaluation is complete.  Leaking ostomy bag   Antony Madura, PA-C 06/20/21 0206    Nira Conn, MD 06/20/21 [phone]

## 2021-06-20 NOTE — ED Notes (Signed)
Called pt x3 for vitals and MSE, no response.

## 2021-06-21 ENCOUNTER — Encounter (HOSPITAL_COMMUNITY): Payer: Self-pay | Admitting: *Deleted

## 2021-06-26 ENCOUNTER — Telehealth: Payer: Self-pay

## 2021-06-26 NOTE — Telephone Encounter (Signed)
Patient returned to clinic this afternoon stating that his ostomy appliance will not stay intact. He is very frustrated that the bags will not adhere. He re-applied another bag and took extra supply with him.  Reminded him that this CM is trying to schedule him an appointment with the ostomy nurse for next week.

## 2021-06-26 NOTE — Telephone Encounter (Signed)
Patient came to clinic today and picked up additional ostomy supplies- bags, barrier rings and skin barrier powder.  He reports that the bags are not even adhering for a day. He is in agreement to meeting with Maple Hudson, FNP next week.  Message left for [place]/ [facility]omy Clinic informing her that the patient was at [facility] this morning and would like to schedule an appointment at the ostomy clinic next week.  This CM informed patient that his voicemail is not set up and it is important that he answer/return calls. He said he understood but he does not want to set up the voicemail.

## 2021-06-27 ENCOUNTER — Telehealth: Payer: Self-pay

## 2021-06-27 NOTE — Telephone Encounter (Signed)
Call received from patient today requesting assistance with housing as he remains homeless and has been staying on the street. He said he has contacted all local resources - [employer], [employer], [facility], [facility] but there is no place for him to stay. Shelters are full.  Asante McCoy, LCSW present for the call.  She suggested [facility] in [place] but he was concerned about transportation to get there and then to get to services that he needs. He stated that he doesn't know anything about [place]. Explained to him that emergency shelters/housing options are very scare scarce in [place] and most of the time there are no options. He wanted to know if [facility] could pay for him to stay a night at a motel.  Informed him that this CM would check [facility] again as well as [employer].   Calls placed to [employer] to inquire about vacancies. 2 messages left with call back requested to this CM.  Calls also place to [employer], caseworker/ [facility]  Messages left on her cell phone and office phone requesting a call back to this CM.     This CM called patient this afternoon and informed him that this CM has not heard back from resources that were contacted early this afternoon. He said he will need to stay on the street again and all he really needs is $20-40 to pay for the motel room.  He explained that he receives about $800/month disability but he should be getting more and is not sure why he doesn't receive it. This CM explained that a referral can be sent to [employer] [place] [place] to see if they can assist with his disability question.  This CM to also inquire if funding is available to assist with paying for motel room.

## 2021-06-29 ENCOUNTER — Telehealth: Payer: Self-pay | Admitting: Family Medicine

## 2021-06-29 NOTE — Telephone Encounter (Signed)
Patient returned cal back, from today, didn't say what it was for then hu. Please call back

## 2021-06-30 ENCOUNTER — Emergency Department (HOSPITAL_BASED_OUTPATIENT_CLINIC_OR_DEPARTMENT_OTHER): Payer: Medicare Other

## 2021-06-30 ENCOUNTER — Other Ambulatory Visit: Payer: Self-pay

## 2021-06-30 ENCOUNTER — Emergency Department (HOSPITAL_COMMUNITY)
Admission: EM | Admit: 2021-06-30 | Discharge: 2021-06-30 | Disposition: A | Discharge status: Home / Self Care | Payer: Medicare Other | Attending: Emergency Medicine | Admitting: Emergency Medicine

## 2021-06-30 DIAGNOSIS — R609 Edema, unspecified: Secondary | ICD-10-CM

## 2021-06-30 DIAGNOSIS — M7989 Other specified soft tissue disorders: Secondary | ICD-10-CM

## 2021-06-30 DIAGNOSIS — Z7901 Long term (current) use of anticoagulants: Secondary | ICD-10-CM | POA: Diagnosis not present

## 2021-06-30 DIAGNOSIS — I82411 Acute embolism and thrombosis of right femoral vein: Secondary | ICD-10-CM | POA: Insufficient documentation

## 2021-06-30 DIAGNOSIS — N1832 Chronic kidney disease, stage 3b: Secondary | ICD-10-CM | POA: Diagnosis not present

## 2021-06-30 DIAGNOSIS — M79604 Pain in right leg: Secondary | ICD-10-CM | POA: Diagnosis present

## 2021-06-30 MED ORDER — KETOROLAC TROMETHAMINE 15 MG/ML IJ SOLN
15.0000 mg | Freq: Once | INTRAMUSCULAR | Status: AC
  Administered 2021-06-30: 15 mg via INTRAMUSCULAR
  Filled 2021-06-30: qty 1

## 2021-06-30 NOTE — ED Triage Notes (Signed)
Pt needed a replacement colostomy bag, was given same.  Also endorses right leg pain and swelling.  Pt states he is not currently taking his xarelto
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
## 2021-06-30 NOTE — Progress Notes (Signed)
VASCULAR LAB    Right lower extremity venous duplex has been performed.  See CV proc for preliminary results.  Gave verbal report to Luciano Cutter, RN  Wm Fruchter, RVT 06/30/2021, 8:25 AM

## 2021-06-30 NOTE — ED Provider Notes (Signed)
Emergency Medicine Provider Triage Evaluation Note  Raymond Bowen , a 45 y.o. male  was evaluated in triage.  Pt complains of RLE swelling and pain. Reports he was told to stop taking his Xarelto when at [facility]; however, had a refill prescribed by his PCP in September. Has been noncompliant with his anticoagulant. 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 Received colostomy supplies for leaking bag while in the waiting room.  Review of Systems  Positive: As above Negative: As above  Physical Exam  BP (!) 142/86 (BP Location: Left Arm)   Pulse 88   Temp 98 F (36.7 C) (Oral)   Resp 18   SpO2 99%  Gen:   Awake, no distress   Resp:  Normal effort  MSK:   Moves extremities without difficulty  Other:  Edema of the RLE compared to left. Compartments of the RLE are soft, compressible. Ambulates with antalgic gait.  Medical Decision Making  Medically screening exam initiated at 3:41 AM.  Appropriate orders placed.  MALCOME AMBROCIO was informed that the remainder of the evaluation will be completed by another provider, this initial triage assessment does not replace that evaluation, and the importance of remaining in the ED until their evaluation is complete.  RLE pain - pending repeat vascular study given Xarelto noncompliance   Antony Madura, PA-C 06/30/21 0344    Jacalyn Lefevre, MD 06/30/21 [phone]

## 2021-06-30 NOTE — Discharge Instructions (Signed)
You were found to have a blood clot again in your right leg.  This is likely something that you will have to take the rest of your life.  I am not sure why your family doctor would have had you stop taking this.  Please have a discussion with them and clarify if that is what they want to do.  I would have you resume taking this medication until you are able to get back in contact with them.  Please return for issues with bleeding especially if you have sudden or persistent or worsening headaches or if you have blood in your stool or dark stool.  Please also return for chest pain difficulty breathing if you start coughing up blood or feel like you are in a pass out.

## 2021-07-01 ENCOUNTER — Encounter (HOSPITAL_COMMUNITY): Payer: Self-pay | Admitting: Emergency Medicine

## 2021-07-01 ENCOUNTER — Other Ambulatory Visit: Payer: Self-pay

## 2021-07-01 ENCOUNTER — Emergency Department (HOSPITAL_COMMUNITY)
Admission: EM | Admit: 2021-07-01 | Discharge: 2021-07-01 | Disposition: A | Discharge status: Home / Self Care | Payer: Medicare Other | Attending: Emergency Medicine | Admitting: Emergency Medicine

## 2021-07-01 DIAGNOSIS — N1832 Chronic kidney disease, stage 3b: Secondary | ICD-10-CM | POA: Insufficient documentation

## 2021-07-01 DIAGNOSIS — K9400 Colostomy complication, unspecified: Secondary | ICD-10-CM | POA: Diagnosis not present

## 2021-07-01 DIAGNOSIS — K9409 Other complications of colostomy: Secondary | ICD-10-CM | POA: Diagnosis not present

## 2021-07-01 DIAGNOSIS — D631 Anemia in chronic kidney disease: Secondary | ICD-10-CM | POA: Diagnosis not present

## 2021-07-01 DIAGNOSIS — Z7901 Long term (current) use of anticoagulants: Secondary | ICD-10-CM | POA: Diagnosis not present

## 2021-07-01 NOTE — Discharge Instructions (Signed)
Return if any problems.

## 2021-07-01 NOTE — ED Triage Notes (Signed)
Pt requesting colostomy bag.
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
## 2021-07-03 ENCOUNTER — Telehealth: Payer: Self-pay | Admitting: Family Medicine

## 2021-07-03 ENCOUNTER — Ambulatory Visit (HOSPITAL_COMMUNITY)
Admission: RE | Admit: 2021-07-03 | Discharge: 2021-07-03 | Disposition: A | Discharge status: Home / Self Care | Payer: Medicare Other | Source: Ambulatory Visit | Attending: Family Medicine | Admitting: Family Medicine

## 2021-07-03 ENCOUNTER — Other Ambulatory Visit: Payer: Self-pay

## 2021-07-03 DIAGNOSIS — K432 Incisional hernia without obstruction or gangrene: Secondary | ICD-10-CM | POA: Diagnosis not present

## 2021-07-03 DIAGNOSIS — L24B1 Irritant contact dermatitis related to digestive stoma or fistula: Secondary | ICD-10-CM

## 2021-07-03 DIAGNOSIS — Z432 Encounter for attention to ileostomy: Secondary | ICD-10-CM | POA: Diagnosis not present

## 2021-07-03 DIAGNOSIS — K469 Unspecified abdominal hernia without obstruction or gangrene: Secondary | ICD-10-CM | POA: Diagnosis not present

## 2021-07-03 DIAGNOSIS — Z932 Ileostomy status: Secondary | ICD-10-CM | POA: Diagnosis not present

## 2021-07-03 NOTE — Discharge Instructions (Signed)
Call clinic for appointment as needed. We can arrange for new products.  Continue to pursue surgical reversal as desired.

## 2021-07-03 NOTE — Telephone Encounter (Signed)
Patient concerns have been addressed.

## 2021-07-03 NOTE — Telephone Encounter (Signed)
Copied from CRM [id number]. Topic: General - Other >> Jul 02, 2021 11:41 AM Gaetana Michaelis A wrote: Reason for CRM: The patient has requested to be contacted by Durene Cal when possible  The patient was requesting an update on Jane's attempt to reach "[facility] [place]" as well as a request for colostomy bags   Please contact further when possible

## 2021-07-03 NOTE — Telephone Encounter (Signed)
Multiple attempts  made to contact patient to remind him that his appointment with Raymond Hudson, NP/ ostomy clinic is today at 1100 but the recording stated that the voicemail is not set up.
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
## 2021-07-04 NOTE — Telephone Encounter (Signed)
Call returned to patient and he said he is still struggling to find a place to stay.  He has been at the [facility] all morning but has not received any assistance with housing.  He is in agreement to having this CM submit a referral to [facility] for [facility] even though it is after the deadline.  He said he has tried everything and last night slept in the lobby of a hotel.  Explained to him that housing resources unfortunately are very limited in [place] but this CM continues to inquire about options.   He said his ostomy is fine,no problems today

## 2021-07-04 NOTE — Telephone Encounter (Signed)
Pt called in requesting to speak with Erskine Squibb as soon as possible, please advise.

## 2021-07-05 ENCOUNTER — Telehealth: Payer: Self-pay

## 2021-07-05 NOTE — Telephone Encounter (Signed)
Referral for temporary housing at [facility] sent to [facility].  There is no availability at [facility] at this time; but the referral was placed in the event a room opens up.

## 2021-07-06 ENCOUNTER — Encounter (HOSPITAL_COMMUNITY): Payer: Self-pay | Admitting: Emergency Medicine

## 2021-07-06 ENCOUNTER — Emergency Department (HOSPITAL_COMMUNITY)
Admission: EM | Admit: 2021-07-06 | Discharge: 2021-07-06 | Disposition: A | Discharge status: Home / Self Care | Payer: Medicare Other | Attending: Emergency Medicine | Admitting: Emergency Medicine

## 2021-07-06 ENCOUNTER — Other Ambulatory Visit: Payer: Self-pay

## 2021-07-06 DIAGNOSIS — Z5321 Procedure and treatment not carried out due to patient leaving prior to being seen by health care provider: Secondary | ICD-10-CM | POA: Insufficient documentation

## 2021-07-06 DIAGNOSIS — Z433 Encounter for attention to colostomy: Secondary | ICD-10-CM | POA: Diagnosis not present

## 2021-07-06 NOTE — ED Notes (Addendum)
Patient given colostomy supplies  

## 2021-07-06 NOTE — ED Triage Notes (Signed)
Patient here requesting colostomy supplies.

## 2021-07-09 ENCOUNTER — Telehealth: Payer: Self-pay

## 2021-07-09 NOTE — Telephone Encounter (Signed)
Message received from [facility] noting that they have a room at [facility] for the patient starting today however they have not been able to reach him and have left him messages. She said his mother let him know but he has not responded.    This CM attempted multiple times to contact him # [phone] but the phone rings fast busy.  He needs to call April [facility] # [phone].

## 2021-07-09 NOTE — Telephone Encounter (Signed)
Attempted to contact patient, phone continues to ring fast busy.

## 2021-07-09 NOTE — Telephone Encounter (Signed)
Another attempt made to contact the patient and his phone continues to ring fast busy.

## 2021-07-10 ENCOUNTER — Telehealth: Payer: Self-pay

## 2021-07-10 NOTE — Telephone Encounter (Signed)
Attempted again to contact patient to inform him to call [facility] regarding housing.  The phone continues to ring fast busy

## 2021-07-11 NOTE — Telephone Encounter (Signed)
Attempted again today to contact patient to inform him to call April[facility] regarding housing.  The phone continues to ring fast busy

## 2021-07-12 ENCOUNTER — Emergency Department (HOSPITAL_COMMUNITY)
Admission: EM | Admit: 2021-07-12 | Discharge: 2021-07-12 | Disposition: A | Discharge status: Home / Self Care | Payer: Medicare Other | Source: Home / Self Care

## 2021-07-12 ENCOUNTER — Other Ambulatory Visit: Payer: Self-pay

## 2021-07-14 ENCOUNTER — Emergency Department (HOSPITAL_BASED_OUTPATIENT_CLINIC_OR_DEPARTMENT_OTHER)
Admit: 2021-07-14 | Discharge: 2021-07-14 | Disposition: A | Discharge status: Home / Self Care | Payer: Medicare Other | Attending: Emergency Medicine | Admitting: Emergency Medicine

## 2021-07-14 ENCOUNTER — Encounter (HOSPITAL_COMMUNITY): Payer: Self-pay

## 2021-07-14 ENCOUNTER — Emergency Department (HOSPITAL_COMMUNITY)
Admission: EM | Admit: 2021-07-14 | Discharge: 2021-07-14 | Disposition: A | Discharge status: Home / Self Care | Payer: Medicare Other | Source: Home / Self Care | Attending: Emergency Medicine | Admitting: Emergency Medicine

## 2021-07-14 ENCOUNTER — Emergency Department (HOSPITAL_COMMUNITY)
Admission: EM | Admit: 2021-07-14 | Discharge: 2021-07-14 | Disposition: A | Discharge status: Home / Self Care | Payer: Medicare Other | Attending: Emergency Medicine | Admitting: Emergency Medicine

## 2021-07-14 ENCOUNTER — Other Ambulatory Visit: Payer: Self-pay

## 2021-07-14 DIAGNOSIS — I824Y1 Acute embolism and thrombosis of unspecified deep veins of right proximal lower extremity: Secondary | ICD-10-CM

## 2021-07-14 DIAGNOSIS — Z79899 Other long term (current) drug therapy: Secondary | ICD-10-CM | POA: Diagnosis not present

## 2021-07-14 DIAGNOSIS — R609 Edema, unspecified: Secondary | ICD-10-CM

## 2021-07-14 DIAGNOSIS — M7989 Other specified soft tissue disorders: Secondary | ICD-10-CM | POA: Insufficient documentation

## 2021-07-14 DIAGNOSIS — M79604 Pain in right leg: Secondary | ICD-10-CM | POA: Diagnosis not present

## 2021-07-14 DIAGNOSIS — D631 Anemia in chronic kidney disease: Secondary | ICD-10-CM | POA: Insufficient documentation

## 2021-07-14 DIAGNOSIS — I82401 Acute embolism and thrombosis of unspecified deep veins of right lower extremity: Secondary | ICD-10-CM | POA: Diagnosis not present

## 2021-07-14 DIAGNOSIS — R600 Localized edema: Secondary | ICD-10-CM | POA: Insufficient documentation

## 2021-07-14 DIAGNOSIS — N1832 Chronic kidney disease, stage 3b: Secondary | ICD-10-CM | POA: Insufficient documentation

## 2021-07-14 DIAGNOSIS — F17210 Nicotine dependence, cigarettes, uncomplicated: Secondary | ICD-10-CM | POA: Diagnosis not present

## 2021-07-14 LAB — COMPREHENSIVE METABOLIC PANEL
ALT: 29 U/L (ref 0–44)
AST: 41 U/L (ref 15–41)
Albumin: 3.8 g/dL (ref 3.5–5.0)
Alkaline Phosphatase: 69 U/L (ref 38–126)
Anion gap: 6 (ref 5–15)
BUN: 21 mg/dL — ABNORMAL HIGH (ref 6–20)
CO2: 19 mmol/L — ABNORMAL LOW (ref 22–32)
Calcium: 8.8 mg/dL — ABNORMAL LOW (ref 8.9–10.3)
Chloride: 112 mmol/L — ABNORMAL HIGH (ref 98–111)
Creatinine, Ser: 1.92 mg/dL — ABNORMAL HIGH (ref 0.61–1.24)
GFR, Estimated: 44 mL/min — ABNORMAL LOW (ref >60)
Glucose, Bld: 99 mg/dL (ref 70–99)
Potassium: 4 mmol/L (ref 3.5–5.1)
Sodium: 137 mmol/L (ref 135–145)
Total Bilirubin: 0.4 mg/dL (ref 0.3–1.2)
Total Protein: 7.8 g/dL (ref 6.5–8.1)

## 2021-07-14 LAB — CBC WITH DIFFERENTIAL/PLATELET
Abs Immature Granulocytes: 0.07 10*3/uL (ref 0.00–0.07)
Basophils Absolute: 0.1 10*3/uL (ref 0.0–0.1)
Basophils Relative: 0 %
Eosinophils Absolute: 0.4 10*3/uL (ref 0.0–0.5)
Eosinophils Relative: 3 %
HCT: 37.5 % — ABNORMAL LOW (ref 39.0–52.0)
Hemoglobin: 11.4 g/dL — ABNORMAL LOW (ref 13.0–17.0)
Immature Granulocytes: 1 %
Lymphocytes Relative: 14 %
Lymphs Abs: 2.1 10*3/uL (ref 0.7–4.0)
MCH: 26.7 pg (ref 26.0–34.0)
MCHC: 30.4 g/dL (ref 30.0–36.0)
MCV: 87.8 fL (ref 80.0–100.0)
Monocytes Absolute: 1.2 10*3/uL — ABNORMAL HIGH (ref 0.1–1.0)
Monocytes Relative: 8 %
Neutro Abs: 11.3 10*3/uL — ABNORMAL HIGH (ref 1.7–7.7)
Neutrophils Relative %: 74 %
Platelets: 254 10*3/uL (ref 150–400)
RBC: 4.27 MIL/uL (ref 4.22–5.81)
RDW: 20.6 % — ABNORMAL HIGH (ref 11.5–15.5)
WBC: 15.1 10*3/uL — ABNORMAL HIGH (ref 4.0–10.5)
nRBC: 0 % (ref 0.0–0.2)

## 2021-07-14 NOTE — ED Triage Notes (Signed)
Pt BIB EMS with reports of right leg swelling and pain. Pt states that he has had clotting issues in the past.

## 2021-07-14 NOTE — Progress Notes (Signed)
Right lower extremity venous duplex has been completed. Preliminary results can be found in CV Proc through chart review.  Results were given to Dr. Freida Busman.  07/14/21 10:48 AM Olen Cordial RVT

## 2021-07-14 NOTE — ED Triage Notes (Signed)
Patient c/o right leg and right foot swelling since yesterday.
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
## 2021-07-17 ENCOUNTER — Telehealth: Payer: Self-pay

## 2021-07-17 ENCOUNTER — Other Ambulatory Visit: Payer: Self-pay | Admitting: Family Medicine

## 2021-07-17 DIAGNOSIS — M21371 Foot drop, right foot: Secondary | ICD-10-CM

## 2021-07-17 DIAGNOSIS — M792 Neuralgia and neuritis, unspecified: Secondary | ICD-10-CM

## 2021-07-17 NOTE — Telephone Encounter (Signed)
Call placed to patient and informed Raymond Bowen that per  Dawayne Cirri, FUSE Team Lead/IRC,  Raymond Bowen has been out of the office but will return tomorrow and she will give Raymond Bowen a call, his bed is still available.   He said okay and he will contact Raymond Bowen tomorrow.    Call placed to [facility] # [phone] , spoke to Raymond Bowen who stated that they have not received any orders for the patient since 03/2021.  She said that the provider can email/fax the orders to them using the order form on their website.   Email sent to Raymond Hudson, FNP informing her that [facility] needs new orders for ostomy supplies.
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
## 2021-07-18 ENCOUNTER — Telehealth: Payer: Self-pay | Admitting: Family Medicine

## 2021-07-18 NOTE — Telephone Encounter (Signed)
Copied from CRM [id number]. Topic: General - Other >> Jul 17, 2021  8:57 AM Gaetana Michaelis A wrote: Reason for CRM: The patient would like to speak with J. Brazeau when possible   The patient was unable to share the reason for the request

## 2021-07-18 NOTE — Telephone Encounter (Signed)
This CM met with the patient since this call was received by [facility] yesterday.

## 2021-07-19 ENCOUNTER — Telehealth: Payer: Self-pay | Admitting: Family Medicine

## 2021-07-19 NOTE — Telephone Encounter (Signed)
Patient called in about referral he says received from Dr [name] about living quarters, he states they turned him down because its not his mother. Please call back

## 2021-07-19 NOTE — Telephone Encounter (Signed)
Call returned to patient who explained that he spoke to April/ [facility] and was told about the room available at the [facility].   However, he said that the woman, Tramane Gorum,  claiming to be his mother, is not his mother. He said his mother died 3 years ago and he is not sure if he is related to this woman. Explained to him that this CM will contact April to see what is going on.  Call placed to April Anderson, FUSE case manager/IRC  #[phone], message left with call back requested to this CM. Call placed to Dawayne Cirri, [facility] Team Lead/IRC # [phone], message left with call back requested to this CM.  Email also sent to [facility] requesting more information about the status of the housing referral.
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
## 2021-07-19 NOTE — Telephone Encounter (Signed)
Copied from CRM [id number]. Topic: General - Other >> Jul 17, 2021  8:57 AM Gaetana Michaelis A wrote: Reason for CRM: The patient would like to speak with Durene Cal when possible   The patient was unable to share the reason for the request >> Jul 19, 2021  1:09 PM Jaquita Rector A wrote: The patient called would like to speak with J. Brazeau as soon as possible. The patient was unable to share the reason for the request just stated that she need to call him back its very urgent. Can be reached at Ph# [phone]

## 2021-07-19 NOTE — Telephone Encounter (Signed)
Call returned to patient. Information documented in another call from today.

## 2021-07-24 ENCOUNTER — Emergency Department (HOSPITAL_COMMUNITY)
Admission: EM | Admit: 2021-07-24 | Discharge: 2021-07-25 | Disposition: A | Discharge status: Home / Self Care | Payer: Medicare Other | Attending: Emergency Medicine | Admitting: Emergency Medicine

## 2021-07-24 ENCOUNTER — Encounter (HOSPITAL_COMMUNITY): Payer: Self-pay

## 2021-07-24 ENCOUNTER — Telehealth: Payer: Self-pay | Admitting: Family Medicine

## 2021-07-24 ENCOUNTER — Other Ambulatory Visit: Payer: Self-pay

## 2021-07-24 DIAGNOSIS — R079 Chest pain, unspecified: Secondary | ICD-10-CM | POA: Diagnosis not present

## 2021-07-24 DIAGNOSIS — Z432 Encounter for attention to ileostomy: Secondary | ICD-10-CM | POA: Diagnosis not present

## 2021-07-24 DIAGNOSIS — R0789 Other chest pain: Secondary | ICD-10-CM | POA: Diagnosis not present

## 2021-07-24 DIAGNOSIS — Z433 Encounter for attention to colostomy: Secondary | ICD-10-CM | POA: Diagnosis not present

## 2021-07-24 NOTE — Telephone Encounter (Signed)
Copied from [place] [id number]. Topic: General - Other >> Jul 24, 2021  1:23 PM McGill, Nelva Bush wrote: Reason for CRM: Pt Requesting a call back from Emily.  Pt stated it is very important she calls him as soon as possible.

## 2021-07-24 NOTE — ED Triage Notes (Signed)
Pt BIB EMS. Pt needs a colostomy bag.

## 2021-07-24 NOTE — Telephone Encounter (Signed)
This CM spoke to the patient this morning. He explained that the ostomy bags are not working very well, the one he has on is leaking and he is out of new bags.   He then said that April/IRC was not in the office yesterday because it was a holiday. He has tried to reach her today to inquire about housing and said he paid to stay at a motel last night.  Explained to him that this CM will contact Maple Hudson, NP/ostomy clinic regarding his supplies and will also contact Bennita Curtain/IRC about housing as well as [facility] supply.   Call placed to [facility] # [phone] regarding ostomy supply order. Spoke to [employer] who said that they have not received a new order for supplies. The last order they received was 03/2021.   Email sent to [employer] Team Lead [facility] inquiring about patient's housing and informing her that the patient has not been able to reach April. The following information was then received from Ms Curtain:  She called him today and he is schedule for intake on tomorrow and will be able to move in then.    This CM paged Maple Hudson, NP and informed her that the patient is in need of supplies and is having difficulty keeping the current bag in place. She explained that she has no other options to offer patient for supplies/bag.  She will leave him some samples at the front desk at the [facility] that he can pick up tomorrow after 1000.   She said she faxed the order for the new supplies to [facility] last week and will follow up with the company again.  Explained to her that the orders can also be emailed to [employer].  The email address is on the order form.  Regarding ostomy reversal, she said that she instructed the patient to contact Kendell Bane to schedule an appointment with the surgeon who placed the ostomy and discuss reversal.    This CM returned call to patient this afternoon and explained above information from [facility], [facility] and Maple Hudson, NP.  He said that he  spoke to [facility] and is scheduled to meet her tomorrow at 1000 and move into his room at that time. Reminded him of the importance of keeping that appointment as well as continuing to work with [facility] about permanent housing.  Also informed him that Maple Hudson, NP will leave supplies for him to pick up tomorrow after 1000. Regarding ostomy reversal, he said he has never been to [facility]. The surgeon is at [facility].  He gave permission to this CM to contact [facility] surgery to schedule an appointment for him to discuss ostomy reversal.

## 2021-07-25 NOTE — ED Notes (Signed)
Pt provided barrier ointment and new colostomy bag. Pt placing new colostomy bag self.

## 2021-07-25 NOTE — ED Notes (Signed)
Pt ambulatory without assistance.  

## 2021-07-25 NOTE — Discharge Instructions (Signed)
Work-up today was reassuring. I feel that the change in ostomy bags with use of tape around the site and stool leaking out all likely caused your skin irritation. I have given you barrier cream for this and recommend you discuss further with your ostomy team about management moving forward.  Return if development of an new or worsening symptoms

## 2021-07-25 NOTE — ED Provider Notes (Signed)
[facility]-EMERGENCY DEPT Provider Note   CSN: [id number] Arrival date & time: 07/24/21  2317     History  Chief Complaint  Patient presents with   colostomy    Raymond Bowen is a 46 y.o. male.  Patient with ostomy bag in place presents today stating that he needs his ostomy bag replaced. He states that his ostomy team replaced his bag recently and it irritated his skin and has been leaking out around the sides of his bag. He has an appointment at 10am today to receive new supplies and meet with his ostomy team.  The history is provided by the patient. No language interpreter was used.      Home Medications Prior to Admission medications   Medication Sig Start Date End Date Taking? Authorizing Provider  acetaminophen (TYLENOL) 500 MG tablet Take 2 tablets (1,000 mg total) by mouth every 8 (eight) hours as needed. Patient not taking: Reported on 07/14/2021 03/10/20   Caccavale, Sophia, PA-C  DULoxetine (CYMBALTA) 60 MG capsule Take 1 capsule (60 mg total) by mouth daily. Patient not taking: Reported on 07/14/2021 03/29/21   Hoy Register, MD  furosemide (LASIX) 20 MG tablet TAKE 1 TABLET BY MOUTH ONCE DAILY NEEDED FOR LEG SWELLING. APPOINTMENT REQUIRED FOR FUTURE REFILLS Patient not taking: Reported on 07/14/2021 05/20/21   Hoy Register, MD  methocarbamol (ROBAXIN) 500 MG tablet Take 1 tablet (500 mg total) by mouth every 8 (eight) hours as needed for muscle spasms. Patient not taking: Reported on 07/14/2021 03/29/21   Hoy Register, MD  Misc. Devices MISC Colostomy bags. 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 Patient taking differently: Take 20 mg by mouth daily. 03/29/21   Hoy Register, MD   tamsulosin (FLOMAX) 0.4 MG CAPS capsule Take 1 capsule (0.4 mg total) by mouth daily. Patient not taking: Reported on 07/14/2021 04/12/21   Crista Elliot, MD      Allergies    Shellfish allergy, Biopatch protective disk-chg [chlorhexidine], and Vancomycin    Review of Systems   Review of Systems  Constitutional:  Negative for chills and fever.  Gastrointestinal:  Negative for abdominal pain, nausea and vomiting.  All other systems reviewed and are negative.  Physical Exam Updated Vital Signs BP (!) 141/103 (BP Location: Left Arm)    Pulse (!) 102    Temp 98.6 F (37 C) (Oral)    Resp 16    SpO2 98%  Physical Exam Vitals and nursing note reviewed.  Constitutional:      Appearance: Normal appearance.  Cardiovascular:     Rate and Rhythm: Normal rate.  Pulmonary:     Effort: Pulmonary effort is normal. No respiratory distress.  Abdominal:     Palpations: Abdomen is soft.     Comments: Ostomy site right abdomen, some surrounding skin irritation and dryness with a few open sores which are clean without signs of infection, ostomy bag in place with duct tape at present with liquid stool oozing out around the edges of the bag  Neurological:     Mental Status: He is alert.    ED Results / Procedures / Treatments   Labs (all labs ordered are listed, but only abnormal results are displayed) Labs Reviewed -  No data to display  EKG None  Radiology No results found.  Procedures Procedures    Medications Ordered in ED Medications - No data to display  ED Course/ Medical Decision Making/ A&P                           Medical Decision Making  Patient here requesting ostomy bag replacement. He presents with ostomy bag in place with duct tape with liquid stool dripping out around the edges. He has an appointment at 10 am this morning to get ostomy supplies. Recently got new bags that caused skin irritation. Some sores present around patients ostomy site that patient states  that he scratched open due to skin irritation. No signs of infection at this time. He is afebrile, non-toxic appearing, and in no acute distress with reassuring vital signs. Feel that this is likely due to skin irritation from new bags with confounding irritation from duct tape use around bag also with likely irritation from stool leaking around the ostomy site. Will recommend barrier cream for management of this and recommend that it be further evaluated by patients ostomy team who he has an appointment to see in an hour. He is amenable with plan, educated on red flag symptoms that would prompt immediate return. Discharged in stable condition.  Final Clinical Impression(s) / ED Diagnoses Final diagnoses:  Ileostomy bag changed (HCC)    Rx / DC Orders ED Discharge Orders     None      An After Visit Summary was printed and given to the patient.    Silva Bandy, PA-C 07/25/21 [id number]    Derwood Kaplan, MD 07/25/21 [phone]

## 2021-07-30 ENCOUNTER — Telehealth: Payer: Self-pay

## 2021-07-30 NOTE — Telephone Encounter (Signed)
Call placed to [facility] General [facility] # [phone] to schedule patient appointment for evaluation for ostomy reversal.  Spoke to Joy who said that they last saw the patient 05/25/2019 and he has no showed for all appointments since then. She agreed to schedule him an appointment for 08/14/2021 @ 1300 - [facility], [place]. Provided her with patient's phone number and informed her that this CM does not have a current address for him.  Call placed to patient in attempt to inform him of above noted appointment.  Voicemail not set up, unable to leave a message.

## 2021-08-01 ENCOUNTER — Telehealth: Payer: Self-pay

## 2021-08-01 NOTE — Telephone Encounter (Signed)
Patient called in requesting to speak with someone about his ostomy supplies, requesting a call back as soon as possible.

## 2021-08-01 NOTE — Telephone Encounter (Signed)
Pt asked if someone can call him asap/ he stated he was on his way to the office and wanted to make sure his ostomy supplies were in the office / please call pt and advise asap

## 2021-08-01 NOTE — Telephone Encounter (Signed)
Call placed to patient and informed him that his ostomy supplies were just delivered from [facility]. He said he would come to the clinic tomorrow to pick them up

## 2021-08-01 NOTE — Telephone Encounter (Signed)
Met with the patient when he came to the office this morning.  He was in need of ostomy supplies. Informed him that [facility] order has not been delivered yet.  This CM spoke to Domenic Moras, FNP and she said she would leave patient 2 ostomy bags at the front desk of the hospital for him to pick up.  Informed her that patient has an appointment at [place] with surgeon on 08/14/2021.  Call placed to [facility], spoke to Judson Roch who said that the order was shipped 07/30/2021 and is out for delivery today to [facility] [place]. [address], [place].   Informed patient that he can pick up the ostomy bags at the hospital front desk. Also informed him that this CM will call him when the delivery is received from [facility]. He said he has been living at the [facility] and he is there indefinitely and they are supposed to be working with him for more permanent housing.   This CM explained to him that an appointment has been scheduled for him to meet with the general surgeon at [facility] on 08/14/2021 @ 1300.  He said he will take the bus and he knows where that office is locate.  This CM stressed the importance of keeping this appointment and he said he understood.

## 2021-08-02 ENCOUNTER — Telehealth: Payer: Self-pay

## 2021-08-02 NOTE — Telephone Encounter (Signed)
Patient came to the clinic today and picked up some of his ostomy supplies that were delivered yesterday.   Reminded him of the appointment with the surgeon at [facility] on 08/14/2021 and he said he is aware and will not miss that appointment.  He is currently at the [facility] and said April, caseworker from [facility] is supposed to be assisting him with finding permanent housing. He also said that while he is at [facility], they are providing him with 3 meals/day.

## 2021-08-06 ENCOUNTER — Telehealth: Payer: Self-pay

## 2021-08-06 NOTE — Telephone Encounter (Signed)
Patient came to clinic and picked up additional ostomy supplies.  He stated that he remembers the appointment at [place] next week - 08/14/2021 @ 1300.

## 2021-08-14 ENCOUNTER — Telehealth: Payer: Self-pay

## 2021-08-14 DIAGNOSIS — K439 Ventral hernia without obstruction or gangrene: Secondary | ICD-10-CM | POA: Diagnosis not present

## 2021-08-14 NOTE — Telephone Encounter (Signed)
Call received from patient stating that he does not have a ride to his appointment in [place] this afternoon.  Informed him that this CM will check with [employer] to see if they are able to assist with a ride to [facility]  -[address], [address] [facility], [place], [place] for appointment today at 1300.  Per [employer] ,they are able to assist and he scheduled a ride for the patient. - A vehicle will be there at approximately 11:53am to pick him up.  He will receive text notifications to the phone number that was provided with the details of the driver, vehicle, and exact time when the driver arrives.  This CM called patient and explained above noted information from [place]. The patient said he received a text with that information.  This CM reminded him to be ready for pick up at lobby of [facility] by 1145 at the latest.  This CM also reminded him to leave is phone on.  He said he will and he can accept text messages and make calls. Instructed him to call this CM with questions and stressed the importance of him keeping this appointment today and reminded him again of the address of the surgical clinic where he is going.

## 2021-08-14 NOTE — Telephone Encounter (Signed)
Call received from patient, he explained that he finished his appointment and was not sure what to do to contact his ride back to [place]. He then said that he just received a text message from the driver.  Instructed him to speak with the driver and determine the best place to meet.  The patient said he needs a colonoscopy and CT scan and hopes to have the surgery for the ostomy reversal in March 2023.    Call placed to patient later this afternoon to confirm he returned home,  Voicemail is not set up, unable to leave a message.

## 2021-08-16 ENCOUNTER — Telehealth: Payer: Self-pay

## 2021-08-16 NOTE — Telephone Encounter (Signed)
Spoke to patient today and he will pick up some ostomy supplies at [facility] tomorrow.  He has been instructed to ask for Alycia and the has also been informed of the new address for the clinic.

## 2021-08-17 ENCOUNTER — Telehealth: Payer: Self-pay | Admitting: Family Medicine

## 2021-08-17 NOTE — Telephone Encounter (Signed)
Patient picked up supplies for ostomy and 2 bus passes

## 2021-08-17 NOTE — Telephone Encounter (Signed)
Patient called in stated that he was told by Erskine Squibb to come pick up supplies but he is needing to know if there are any bus passes available please call Ph# [phone]

## 2021-08-17 NOTE — Telephone Encounter (Signed)
Pt states he needs his ostomy supplies today. He states Erskine Squibb told him to go by the new office and someone would be there.   However, he has no way to get there.  He has to be at work at 5 pm today.  He states Erskine Squibb usually gives him bus passes. Please advise if anyone can help.

## 2021-08-27 ENCOUNTER — Telehealth: Payer: Self-pay

## 2021-08-27 NOTE — Telephone Encounter (Signed)
Call received from patient stating he is out of skin barriers with flanges and needs them today.   Call placed to [facility] # [phone], spoke to Maralyn Sago and requested refills of patient's supply order be shipped to [facility].  She said that the order should be shipped out today and received in the next 2-3 days.  Provided her with the new address for [facility] and she repeated back the correct address.  This CM spoke to Maple Hudson, NP and informed her of the patient's need for supplies. She said she would leave 2 for him to pick up today.   Call placed to patient and informed him that [name] will be leaving some supplies for him to pick up today and he verbalized understanding.

## 2021-08-30 ENCOUNTER — Telehealth: Payer: Self-pay

## 2021-08-30 NOTE — Telephone Encounter (Signed)
Call placed to patient to inform him that we have received another order of ostomy supplies for him. Voicemail not set up, unable to leave a message.

## 2021-08-31 NOTE — Telephone Encounter (Signed)
Noted  

## 2021-09-03 NOTE — Telephone Encounter (Signed)
Patient called in , I told him about the ostomy bags. He still wants cal back, wouldn't say whats for.

## 2021-09-03 NOTE — Telephone Encounter (Signed)
Patient came to [facility] today and picked up  some ostomy supplies.   He said that [place] general surgery office just called him and he is scheduled for a colonoscopy 09/25/2021 and will need to do an enema 2 hours prior to the procedure. He said that they told him he can get the enema OTC. The procedure will be done in [facility]

## 2021-09-12 ENCOUNTER — Telehealth: Payer: Self-pay

## 2021-09-12 NOTE — Telephone Encounter (Signed)
Patient came to the clinic today and picked up some of his ostomy supplies as well as bus passes. He has an appointment on 09/25/2021 for CT scan at [facility] and he confirmed that he has transportation to get to that appointment.

## 2021-09-16 ENCOUNTER — Observation Stay (HOSPITAL_COMMUNITY)
Admission: EM | Admit: 2021-09-16 | Discharge: 2021-09-16 | Discharge status: Against Medical Advice | Payer: Medicare Other | Attending: Internal Medicine | Admitting: Internal Medicine

## 2021-09-16 ENCOUNTER — Emergency Department (HOSPITAL_COMMUNITY): Payer: Medicare Other

## 2021-09-16 ENCOUNTER — Other Ambulatory Visit: Payer: Self-pay

## 2021-09-16 ENCOUNTER — Encounter (HOSPITAL_COMMUNITY): Payer: Self-pay

## 2021-09-16 DIAGNOSIS — R1110 Vomiting, unspecified: Secondary | ICD-10-CM | POA: Diagnosis not present

## 2021-09-16 DIAGNOSIS — N200 Calculus of kidney: Secondary | ICD-10-CM | POA: Diagnosis not present

## 2021-09-16 DIAGNOSIS — F17210 Nicotine dependence, cigarettes, uncomplicated: Secondary | ICD-10-CM | POA: Diagnosis not present

## 2021-09-16 DIAGNOSIS — Z79899 Other long term (current) drug therapy: Secondary | ICD-10-CM | POA: Diagnosis not present

## 2021-09-16 DIAGNOSIS — Z9889 Other specified postprocedural states: Secondary | ICD-10-CM | POA: Insufficient documentation

## 2021-09-16 DIAGNOSIS — K2970 Gastritis, unspecified, without bleeding: Secondary | ICD-10-CM | POA: Diagnosis not present

## 2021-09-16 DIAGNOSIS — N281 Cyst of kidney, acquired: Secondary | ICD-10-CM | POA: Diagnosis not present

## 2021-09-16 DIAGNOSIS — Z20822 Contact with and (suspected) exposure to covid-19: Secondary | ICD-10-CM | POA: Diagnosis not present

## 2021-09-16 DIAGNOSIS — K8020 Calculus of gallbladder without cholecystitis without obstruction: Secondary | ICD-10-CM | POA: Diagnosis not present

## 2021-09-16 DIAGNOSIS — K769 Liver disease, unspecified: Secondary | ICD-10-CM | POA: Diagnosis present

## 2021-09-16 DIAGNOSIS — I82509 Chronic embolism and thrombosis of unspecified deep veins of unspecified lower extremity: Secondary | ICD-10-CM | POA: Diagnosis present

## 2021-09-16 DIAGNOSIS — Z932 Ileostomy status: Secondary | ICD-10-CM | POA: Diagnosis not present

## 2021-09-16 DIAGNOSIS — R109 Unspecified abdominal pain: Secondary | ICD-10-CM

## 2021-09-16 DIAGNOSIS — R1084 Generalized abdominal pain: Principal | ICD-10-CM | POA: Insufficient documentation

## 2021-09-16 DIAGNOSIS — N1832 Chronic kidney disease, stage 3b: Secondary | ICD-10-CM | POA: Diagnosis present

## 2021-09-16 DIAGNOSIS — R1111 Vomiting without nausea: Secondary | ICD-10-CM

## 2021-09-16 DIAGNOSIS — D649 Anemia, unspecified: Secondary | ICD-10-CM | POA: Diagnosis present

## 2021-09-16 DIAGNOSIS — K432 Incisional hernia without obstruction or gangrene: Secondary | ICD-10-CM | POA: Diagnosis not present

## 2021-09-16 LAB — CBC WITH DIFFERENTIAL/PLATELET
Abs Immature Granulocytes: 0.03 10*3/uL (ref 0.00–0.07)
Basophils Absolute: 0 10*3/uL (ref 0.0–0.1)
Basophils Relative: 0 %
Eosinophils Absolute: 0.4 10*3/uL (ref 0.0–0.5)
Eosinophils Relative: 4 %
HCT: 35.7 % — ABNORMAL LOW (ref 39.0–52.0)
Hemoglobin: 11.1 g/dL — ABNORMAL LOW (ref 13.0–17.0)
Immature Granulocytes: 0 %
Lymphocytes Relative: 18 %
Lymphs Abs: 2 10*3/uL (ref 0.7–4.0)
MCH: 28.2 pg (ref 26.0–34.0)
MCHC: 31.1 g/dL (ref 30.0–36.0)
MCV: 90.6 fL (ref 80.0–100.0)
Monocytes Absolute: 1.4 10*3/uL — ABNORMAL HIGH (ref 0.1–1.0)
Monocytes Relative: 12 %
Neutro Abs: 7.6 10*3/uL (ref 1.7–7.7)
Neutrophils Relative %: 66 %
Platelets: 270 10*3/uL (ref 150–400)
RBC: 3.94 MIL/uL — ABNORMAL LOW (ref 4.22–5.81)
RDW: 15.7 % — ABNORMAL HIGH (ref 11.5–15.5)
WBC: 11.6 10*3/uL — ABNORMAL HIGH (ref 4.0–10.5)
nRBC: 0 % (ref 0.0–0.2)

## 2021-09-16 LAB — LIPASE, BLOOD: Lipase: 35 U/L (ref 11–51)

## 2021-09-16 LAB — COMPREHENSIVE METABOLIC PANEL
ALT: 29 U/L (ref 0–44)
AST: 37 U/L (ref 15–41)
Albumin: 3.8 g/dL (ref 3.5–5.0)
Alkaline Phosphatase: 72 U/L (ref 38–126)
Anion gap: 5 (ref 5–15)
BUN: 28 mg/dL — ABNORMAL HIGH (ref 6–20)
CO2: 20 mmol/L — ABNORMAL LOW (ref 22–32)
Calcium: 8.9 mg/dL (ref 8.9–10.3)
Chloride: 111 mmol/L (ref 98–111)
Creatinine, Ser: 1.69 mg/dL — ABNORMAL HIGH (ref 0.61–1.24)
GFR, Estimated: 51 mL/min — ABNORMAL LOW (ref >60)
Glucose, Bld: 117 mg/dL — ABNORMAL HIGH (ref 70–99)
Potassium: 3.8 mmol/L (ref 3.5–5.1)
Sodium: 136 mmol/L (ref 135–145)
Total Bilirubin: 0.1 mg/dL — ABNORMAL LOW (ref 0.3–1.2)
Total Protein: 7.8 g/dL (ref 6.5–8.1)

## 2021-09-16 LAB — RESP PANEL BY RT-PCR (FLU A&B, COVID) ARPGX2
Influenza A by PCR: NEGATIVE
Influenza B by PCR: NEGATIVE
SARS Coronavirus 2 by RT PCR: NEGATIVE

## 2021-09-16 LAB — MAGNESIUM: Magnesium: 2 mg/dL (ref 1.7–2.4)

## 2021-09-16 MED ORDER — HYDROMORPHONE HCL 1 MG/ML IJ SOLN
1.0000 mg | Freq: Once | INTRAMUSCULAR | Status: AC
  Administered 2021-09-16: 1 mg via INTRAVENOUS
  Filled 2021-09-16: qty 1

## 2021-09-16 MED ORDER — ACETAMINOPHEN 650 MG RE SUPP: 650.0000 mg | Freq: Four times a day (QID) | RECTAL | Status: DC | PRN

## 2021-09-16 MED ORDER — SODIUM CHLORIDE 0.9 % IV BOLUS
1000.0000 mL | Freq: Once | INTRAVENOUS | Status: AC
  Administered 2021-09-16: 1000 mL via INTRAVENOUS

## 2021-09-16 MED ORDER — LACTATED RINGERS IV BOLUS
1000.0000 mL | Freq: Once | INTRAVENOUS | Status: AC
Start: 2021-09-16 — End: 2021-09-16
  Administered 2021-09-16: 1000 mL via INTRAVENOUS

## 2021-09-16 MED ORDER — LACTATED RINGERS IV SOLN: INTRAVENOUS | Status: DC

## 2021-09-16 MED ORDER — SODIUM CHLORIDE 0.9 % IV SOLN: Freq: Once | INTRAVENOUS | Status: AC

## 2021-09-16 MED ORDER — ONDANSETRON HCL 4 MG PO TABS: 4.0000 mg | ORAL_TABLET | Freq: Four times a day (QID) | ORAL | Status: DC | PRN

## 2021-09-16 MED ORDER — ACETAMINOPHEN 325 MG PO TABS: 650.0000 mg | ORAL_TABLET | Freq: Four times a day (QID) | ORAL | Status: DC | PRN

## 2021-09-16 MED ORDER — PANTOPRAZOLE SODIUM 40 MG IV SOLR
40.0000 mg | INTRAVENOUS | Status: DC
  Administered 2021-09-16: 40 mg via INTRAVENOUS
  Filled 2021-09-16: qty 10

## 2021-09-16 MED ORDER — ONDANSETRON HCL 4 MG/2ML IJ SOLN
4.0000 mg | Freq: Once | INTRAMUSCULAR | Status: AC
  Administered 2021-09-16: 4 mg via INTRAVENOUS
  Filled 2021-09-16: qty 2

## 2021-09-16 MED ORDER — ONDANSETRON HCL 4 MG/2ML IJ SOLN: 4.0000 mg | Freq: Four times a day (QID) | INTRAMUSCULAR | Status: DC | PRN

## 2021-09-16 MED ORDER — HYDROMORPHONE HCL 1 MG/ML IJ SOLN
0.7500 mg | INTRAMUSCULAR | Status: DC | PRN
  Administered 2021-09-16: 0.75 mg via INTRAVENOUS
  Filled 2021-09-16: qty 1

## 2021-09-16 MED ORDER — IOHEXOL 300 MG/ML  SOLN
100.0000 mL | Freq: Once | INTRAMUSCULAR | Status: AC | PRN
  Administered 2021-09-16: 100 mL via INTRAVENOUS

## 2021-09-16 NOTE — Progress Notes (Signed)
Patient left AMA and refused to sign the AMA form which is in his chart with two RN signatures. Dr. Olevia Bowens was notified prior to patient leaving.   He claim that no doctor came to see him and did not understand why he could not eat or drink anything. I asked if he was in pain, and he said that he needed supplies to change his ostomy bag. He was given dilaudid not long after he was admitted.
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
## 2021-09-16 NOTE — ED Notes (Signed)
Patient being irritable stating that he wants to leave because we are not feeding or allowing him to drink. Gave patient juice and told him that if he had to have surgery today then this would delay the process. Patient continuing to mumble under breath about staff.

## 2021-09-16 NOTE — ED Triage Notes (Signed)
Patient c/o colostomy pain in triage. States it started to burn today and think its infected
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
## 2021-09-16 NOTE — ED Notes (Signed)
Patient becoming agitated due to staff not giving him food or drink. States the fluids in IV are not doing anything. Educated patient on the importance of surgery team needing to see him first before we interrupt NPO status. Patient refused covid swab at this time
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
## 2021-09-17 ENCOUNTER — Telehealth: Payer: Self-pay

## 2021-09-17 NOTE — Telephone Encounter (Signed)
Call placed to [facility] # [phone] left message for  Maralyn Sago  - requesting call back.  Need to re-order ostomy supplies for patient.

## 2021-09-17 NOTE — Telephone Encounter (Signed)
Transition Care Management Follow-up Telephone Call Date of discharge and from where: 09/16/2021, [facility]  - left AMA How have you been since you were released from the hospital? Met with the patient when he came to the clinic today to pick up ostomy supplies. Provided him with supplies he requested.  Any questions or concerns? No  Items Reviewed: Did the pt receive and understand the discharge instructions provided?  No instructions - left AMA Medications obtained and verified?  No new medication  - left AMA Other? No  Any new allergies since your discharge? No  Dietary orders reviewed? No Do you have support at home?  Lives alone, staying at [facility] temporarily through [place] and Equipment/Supplies: Were home health services ordered? no If so, what is the name of the agency? N/a  Has the agency set up a time to come to the patient's home? not applicable Were any new equipment or medical supplies ordered?  No What is the name of the medical supply agency? N/a Were you able to get the supplies/equipment? not applicable Do you have any questions related to the use of the equipment or supplies? No  Functional Questionnaire: (I = Independent and D = Dependent) ADLs: independent    Follow up appointments reviewed:  PCP Hospital f/u appt confirmed?  Not scheduled yet.  [facility] f/u appt confirmed?  He has an appointment with general surgery @ [place] - 09/25/2021   Are transportation arrangements needed? No  If their condition worsens, is the pt aware to call PCP or go to the Emergency Dept.? Yes Was the patient provided with contact information for the PCP's office or ED? Yes Was to pt encouraged to call back with questions or concerns? Yes

## 2021-09-18 NOTE — Telephone Encounter (Signed)
I spoke to [employer] and placed an order for another shipment of ostomy supplies for patient.  I confirmed that they will be shipped to [facility]. She stated that they could be shipped out Friday - 09/21/2021 with delivery expected around 09/25/2021.

## 2021-09-18 NOTE — Telephone Encounter (Signed)
Copied from [place] [phone]. Topic: General - Other >> Sep 18, 2021  8:52 AM Tessa Lerner A wrote: Reason for CRM: Sarah with [facility] [employer] has returned call from [place]. to order ostomy supplies for patient  Please contact again when possible

## 2021-09-24 ENCOUNTER — Telehealth: Payer: Self-pay

## 2021-09-24 NOTE — Telephone Encounter (Signed)
Call received from patient stating his ostomy bag is leaking and he is in need of supplies and has no transportation to the clinic to pick them up. Informed him that Maralyn Sago, EMT/community paramedicine program,  will deliver the supplies to him this afternoon. ? ?

## 2021-09-27 ENCOUNTER — Telehealth: Payer: Self-pay

## 2021-09-27 NOTE — Telephone Encounter (Signed)
Pt called in to speak with the nurse Erskine Squibb. Pt says that he would like a call back when possible. Pt says that he has to go to work at 4:00 and would like to know if his supplies could be dropped off by then?  ? ?Please assist pt further. ?

## 2021-09-27 NOTE — Telephone Encounter (Signed)
Call received from patient stating he needs additional ostomy supplies and is not able to get to [facility] to pick them up.  Explained to him that Kerry Hough, EMT/ [employer] will deliver them to him today. She will also provide him with an application and contact information for transportation through DSS to medical appointments, including out of county medical appointments as well as to his job.  Encouraged him to call DSS and explain his need for rides. He said he missed his appointment with [facility] yesterday because he did not have transportation.  Reminded him the importance of following up with the surgeon if he wishes to have the ostomy reversed.  It is important for him to re-schedule the appointment he missed.   ? ?Also informed him that ostomy supplies will not be delivered to him by the paramedics on a regular basis, this is only for urgent need.  ? ?Epic was updated with his new phone number.  ?

## 2021-09-27 NOTE — Telephone Encounter (Signed)
His supplies should have been dropped off by Marylouise Stacks, EMT prior to that time.  ?

## 2021-10-01 ENCOUNTER — Telehealth: Payer: Self-pay

## 2021-10-01 NOTE — Telephone Encounter (Signed)
Pt is calling to speak to Opal Sidles regarding his [employer]. Pt hung up on agent. Please advise  ?

## 2021-10-01 NOTE — Telephone Encounter (Signed)
Patient came to clinic today and picked up some of his ostomy supplies  ?

## 2021-10-01 NOTE — Telephone Encounter (Signed)
Pt called back and requested to speak with Raymond Bowen, please advise.  ?

## 2021-10-02 NOTE — Telephone Encounter (Signed)
Patient received supplies on yesterday.  ?

## 2021-10-03 ENCOUNTER — Telehealth: Payer: Self-pay | Admitting: Family Medicine

## 2021-10-03 NOTE — Telephone Encounter (Signed)
This message is from patient on 10/01/2021.  The patient came to the clinic that afternoon and picked up the supplies he needed  ?

## 2021-10-03 NOTE — Telephone Encounter (Signed)
Copied from [place] [phone]. Topic: General - Other >> Oct 01, 2021 12:22 PM Alanda Slim E wrote: Reason for CRM: Pt called to speak with Opal Sidles about supplies that he is needing / please advise

## 2021-10-06 ENCOUNTER — Emergency Department (HOSPITAL_COMMUNITY)
Admission: EM | Admit: 2021-10-06 | Discharge: 2021-10-06 | Disposition: A | Discharge status: Home / Self Care | Payer: Medicare Other | Attending: Emergency Medicine | Admitting: Emergency Medicine

## 2021-10-06 ENCOUNTER — Emergency Department (HOSPITAL_BASED_OUTPATIENT_CLINIC_OR_DEPARTMENT_OTHER): Payer: Medicare Other

## 2021-10-06 ENCOUNTER — Other Ambulatory Visit: Payer: Self-pay

## 2021-10-06 DIAGNOSIS — R748 Abnormal levels of other serum enzymes: Secondary | ICD-10-CM | POA: Insufficient documentation

## 2021-10-06 DIAGNOSIS — M7989 Other specified soft tissue disorders: Secondary | ICD-10-CM | POA: Diagnosis not present

## 2021-10-06 DIAGNOSIS — R609 Edema, unspecified: Secondary | ICD-10-CM

## 2021-10-06 DIAGNOSIS — I10 Essential (primary) hypertension: Secondary | ICD-10-CM | POA: Diagnosis not present

## 2021-10-06 DIAGNOSIS — M79604 Pain in right leg: Secondary | ICD-10-CM | POA: Diagnosis not present

## 2021-10-06 DIAGNOSIS — R109 Unspecified abdominal pain: Secondary | ICD-10-CM | POA: Diagnosis present

## 2021-10-06 DIAGNOSIS — R1084 Generalized abdominal pain: Secondary | ICD-10-CM | POA: Insufficient documentation

## 2021-10-06 DIAGNOSIS — R600 Localized edema: Secondary | ICD-10-CM | POA: Diagnosis not present

## 2021-10-06 DIAGNOSIS — D72829 Elevated white blood cell count, unspecified: Secondary | ICD-10-CM | POA: Insufficient documentation

## 2021-10-06 LAB — COMPREHENSIVE METABOLIC PANEL
ALT: 22 U/L (ref 0–44)
AST: 21 U/L (ref 15–41)
Albumin: 3.3 g/dL — ABNORMAL LOW (ref 3.5–5.0)
Alkaline Phosphatase: 71 U/L (ref 38–126)
Anion gap: 7 (ref 5–15)
BUN: 24 mg/dL — ABNORMAL HIGH (ref 6–20)
CO2: 21 mmol/L — ABNORMAL LOW (ref 22–32)
Calcium: 9.1 mg/dL (ref 8.9–10.3)
Chloride: 109 mmol/L (ref 98–111)
Creatinine, Ser: 1.51 mg/dL — ABNORMAL HIGH (ref 0.61–1.24)
GFR, Estimated: 58 mL/min — ABNORMAL LOW (ref >60)
Glucose, Bld: 101 mg/dL — ABNORMAL HIGH (ref 70–99)
Potassium: 3.8 mmol/L (ref 3.5–5.1)
Sodium: 137 mmol/L (ref 135–145)
Total Bilirubin: 0.3 mg/dL (ref 0.3–1.2)
Total Protein: 6.8 g/dL (ref 6.5–8.1)

## 2021-10-06 LAB — CBC WITH DIFFERENTIAL/PLATELET
Abs Immature Granulocytes: 0.03 10*3/uL (ref 0.00–0.07)
Basophils Absolute: 0 10*3/uL (ref 0.0–0.1)
Basophils Relative: 0 %
Eosinophils Absolute: 0.4 10*3/uL (ref 0.0–0.5)
Eosinophils Relative: 3 %
HCT: 36.5 % — ABNORMAL LOW (ref 39.0–52.0)
Hemoglobin: 11.3 g/dL — ABNORMAL LOW (ref 13.0–17.0)
Immature Granulocytes: 0 %
Lymphocytes Relative: 19 %
Lymphs Abs: 2.3 10*3/uL (ref 0.7–4.0)
MCH: 28.3 pg (ref 26.0–34.0)
MCHC: 31 g/dL (ref 30.0–36.0)
MCV: 91.3 fL (ref 80.0–100.0)
Monocytes Absolute: 1.1 10*3/uL — ABNORMAL HIGH (ref 0.1–1.0)
Monocytes Relative: 9 %
Neutro Abs: 8 10*3/uL — ABNORMAL HIGH (ref 1.7–7.7)
Neutrophils Relative %: 69 %
Platelets: 201 10*3/uL (ref 150–400)
RBC: 4 MIL/uL — ABNORMAL LOW (ref 4.22–5.81)
RDW: 15.2 % (ref 11.5–15.5)
WBC: 11.8 10*3/uL — ABNORMAL HIGH (ref 4.0–10.5)
nRBC: 0 % (ref 0.0–0.2)

## 2021-10-06 LAB — LIPASE, BLOOD: Lipase: 74 U/L — ABNORMAL HIGH (ref 11–51)

## 2021-10-06 MED ORDER — FENTANYL CITRATE PF 50 MCG/ML IJ SOSY: 100.0000 ug | PREFILLED_SYRINGE | Freq: Once | INTRAMUSCULAR | Status: DC

## 2021-10-06 MED ORDER — FENTANYL CITRATE PF 50 MCG/ML IJ SOSY
100.0000 ug | PREFILLED_SYRINGE | Freq: Once | INTRAMUSCULAR | Status: AC
  Administered 2021-10-06: 100 ug via INTRAMUSCULAR
  Filled 2021-10-06: qty 2

## 2021-10-06 MED ORDER — KETOROLAC TROMETHAMINE 15 MG/ML IJ SOLN
15.0000 mg | Freq: Once | INTRAMUSCULAR | Status: AC
  Administered 2021-10-06: 15 mg via INTRAMUSCULAR
  Filled 2021-10-06: qty 1

## 2021-10-06 NOTE — Progress Notes (Signed)
Lower extremity venous has been completed.  ? ?Preliminary results in CV Proc.  ? ?Maddox Hlavaty Molleigh Huot ?10/06/2021 11:49 AM    ?

## 2021-10-06 NOTE — ED Notes (Signed)
Pt given lunch tray.

## 2021-10-06 NOTE — Discharge Instructions (Signed)
As we discussed there is nothing significantly abnormal on your work-up today.  I recommend that you follow-up at your earliest convenience with your PCP for further evaluation.  There is no evidence of new or worsening clot in the right leg.  Please continue take your anticoagulation medication as prescribed.  Please return to the emergency department if your pain worsens, you develop fever, chills, nausea, vomiting.  Please continue to change her colostomy bag appropriately daily.  It looks like you have plenty of refills for ostomy care at the [facility] on [address]. ?

## 2021-10-06 NOTE — ED Notes (Signed)
Pt provided bus pass 
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
## 2021-10-06 NOTE — ED Triage Notes (Signed)
Ems bring pt in from home for right sided abdominal pain. Pt states the pain is from his colostomy bag. Pt also complains of right leg swelling. ?

## 2021-10-08 ENCOUNTER — Other Ambulatory Visit: Payer: Self-pay

## 2021-10-08 ENCOUNTER — Encounter (HOSPITAL_COMMUNITY): Payer: Self-pay | Admitting: Emergency Medicine

## 2021-10-08 ENCOUNTER — Emergency Department (HOSPITAL_COMMUNITY)
Admission: EM | Admit: 2021-10-08 | Discharge: 2021-10-08 | Disposition: A | Discharge status: Home / Self Care | Payer: Medicare Other | Attending: Emergency Medicine | Admitting: Emergency Medicine

## 2021-10-08 DIAGNOSIS — Z5321 Procedure and treatment not carried out due to patient leaving prior to being seen by health care provider: Secondary | ICD-10-CM | POA: Insufficient documentation

## 2021-10-08 DIAGNOSIS — Z432 Encounter for attention to ileostomy: Secondary | ICD-10-CM | POA: Insufficient documentation

## 2021-10-08 DIAGNOSIS — R531 Weakness: Secondary | ICD-10-CM | POA: Diagnosis not present

## 2021-10-08 DIAGNOSIS — Z7901 Long term (current) use of anticoagulants: Secondary | ICD-10-CM | POA: Insufficient documentation

## 2021-10-08 DIAGNOSIS — K9403 Colostomy malfunction: Secondary | ICD-10-CM | POA: Insufficient documentation

## 2021-10-08 NOTE — ED Provider Notes (Signed)
?[name]-EMERGENCY DEPT ?Provider Note ? ? ?CSN: [id number] ?Arrival date & time: 10/08/21  0244 ? ?  ? ?History ? ?Chief Complaint  ?Patient presents with  ? colostomy bag  ? ? ?Raymond Bowen is a 45 y.o. male with multiple medical comorbidities including abdominal surgeries with subsequent ostomy who presents to the emergency department requesting new ostomy bag.  He states that his ostomy has been irritated, he was trying to make it to the morning, however could not take it anymore therefore he came to the ED for new ostomy bag.  He has no other complaints.  He denies nausea, vomiting, abdominal pain, melena, or hematochezia.  He states he has an appointment to get more ostomy supplies this morning. ? ?HPI ? ?  ? ?Home Medications ?Prior to Admission medications   ?Medication Sig Start Date End Date Taking? Authorizing Provider  ?DULoxetine (CYMBALTA) 60 MG capsule Take 1 capsule (60 mg total) by mouth daily. ?Patient not taking: Reported on 07/14/2021 03/29/21   Hoy Register, MD  ?furosemide (LASIX) 20 MG tablet TAKE 1 TABLET BY MOUTH ONCE DAILY NEEDED FOR LEG SWELLING. APPOINTMENT REQUIRED FOR FUTURE REFILLS ?Patient not taking: Reported on 07/14/2021 05/20/21   Hoy Register, MD  ?methocarbamol (ROBAXIN) 500 MG tablet Take 1 tablet (500 mg total) by mouth every 8 (eight) hours as needed for muscle spasms. ?Patient not taking: Reported on 07/14/2021 03/29/21   Hoy Register, MD  ?Misc. Devices MISC Colostomy bags. Diagnosis- Colostomy in place ?Patient not taking: Reported on 07/14/2021 05/10/20   Hoy Register, MD  ?pregabalin (LYRICA) 100 MG capsule Take 1 capsule by mouth twice daily ?Patient not taking: Reported on 09/16/2021 07/18/21   Hoy Register, MD  ?rivaroxaban (XARELTO) 20 MG TABS tablet Take 1 tablet (20 mg total) by mouth daily with supper. ?Patient not taking: Reported on 09/16/2021 03/29/21   Hoy Register, MD  ?tamsulosin (FLOMAX) 0.4 MG CAPS capsule Take 1 capsule  (0.4 mg total) by mouth daily. ?Patient not taking: Reported on 07/14/2021 04/12/21   Crista Elliot, MD  ?   ? ?Allergies    ?Shellfish allergy, Biopatch protective disk-chg [chlorhexidine], and Vancomycin   ? ?Review of Systems   ?Review of Systems  ?Constitutional:  Negative for chills and fever.  ?Respiratory:  Negative for shortness of breath.   ?Cardiovascular:  Negative for chest pain.  ?Gastrointestinal:  Negative for abdominal pain, blood in stool, nausea and vomiting.  ?Neurological:  Negative for syncope.  ?All other systems reviewed and are negative. ? ?Physical Exam ?Updated Vital Signs ?BP 135/62 (BP Location: Left Arm)   Pulse 75   Temp 97.8 ?F (36.6 ?C) (Oral)   Resp 16   Ht 5\' 7"  (1.702 m)   Wt 79.4 kg   SpO2 100%   BMI 27.42 kg/m?  ?Physical Exam ?Vitals and nursing note reviewed.  ?Constitutional:   ?   General: He is not in acute distress. ?   Appearance: He is well-developed.  ?HENT:  ?   Head: Normocephalic and atraumatic.  ?Eyes:  ?   General:     ?   Right eye: No discharge.     ?   Left eye: No discharge.  ?   Conjunctiva/sclera: Conjunctivae normal.  ?Cardiovascular:  ?   Rate and Rhythm: Normal rate and regular rhythm.  ?Pulmonary:  ?   Effort: No respiratory distress.  ?Abdominal:  ?   General: A surgical scar is present. There is no distension.  ?  Palpations: Abdomen is soft.  ?   Tenderness: There is no abdominal tenderness. There is no guarding or rebound.  ?   Comments: Ostomy present with soft brown stool to right abdomen.  No signs of surrounding infection.  ?Neurological:  ?   Mental Status: He is alert.  ?   Comments: Clear speech.   ?Psychiatric:     ?   Behavior: Behavior normal.     ?   Thought Content: Thought content normal.  ? ? ?ED Results / Procedures / Treatments   ?Labs ?(all labs ordered are listed, but only abnormal results are displayed) ?Labs Reviewed - No data to display ? ?EKG ?None ? ?Radiology ?VAS US LOWER EXTREMITY VENOUS (DVT) (7a-7p) ? ?Result  Date: 10/06/2021 ? Lower Venous DVT Study Patient Name:  Raymond LemmingsBRIAN K Bowen  Date of Exam:   10/06/2021 Medical Rec #: [id number]       Accession #:    [id number] Date of Birth: Dec 19, 1975       Patient Gender: M Patient Age:   [age] years [facility] Procedure:      VAS US LOWER EXTREMITY VENOUS (DVT) Referring Phys: CHRISTIAN PROSPERI --------------------------------------------------------------------------------  Indications: Swelling, and Edema.  Comparison Study: prior 07/14/21 Performing Technologist: Argentina PonderMegan Stricklin RVS  Examination Guidelines: A complete evaluation includes B-mode imaging, spectral Doppler, color Doppler, and power Doppler as needed of all accessible portions of each vessel. Bilateral testing is considered an integral part of a complete examination. Limited examinations for reoccurring indications may be performed as noted. The reflux portion of the exam is performed with the patient in reverse Trendelenburg.  +---------+---------------+---------+-----------+----------+---------------+ RIGHT    CompressibilityPhasicitySpontaneityPropertiesThrombus Aging  +---------+---------------+---------+-----------+----------+---------------+ CFV      Full           Yes      Yes                                  +---------+---------------+---------+-----------+----------+---------------+ SFJ      Full                                                         +---------+---------------+---------+-----------+----------+---------------+ FV Prox  Full                                                         +---------+---------------+---------+-----------+----------+---------------+ FV Mid   Partial        Yes      Yes                  Chronic         +---------+---------------+---------+-----------+----------+---------------+ FV DistalPartial        Yes      Yes                  Chronic          +---------+---------------+---------+-----------+----------+---------------+ PFV      Full                                                         +---------+---------------+---------+-----------+----------+---------------+  POP      Partial        Yes      Yes                  Chronic         +---------+---------------+---------+-----------+----------+---------------+ PTV      Full                                                         +---------+---------------+---------+-----------+----------+---------------+ PERO                                                  patent by color +---------+---------------+---------+-----------+----------+---------------+   +----+---------------+---------+-----------+----------+--------------+ LEFTCompressibilityPhasicitySpontaneityPropertiesThrombus Aging +----+---------------+---------+-----------+----------+--------------+ CFV Full           Yes      Yes                                 +----+---------------+---------+-----------+----------+--------------+     Summary: RIGHT: - Findings consistent with chronic deep vein thrombosis involving the right femoral vein, and right popliteal vein. - No cystic structure found in the popliteal fossa.  LEFT: - No evidence of common femoral vein obstruction.  *See table(s) above for measurements and observations. Electronically signed by Heath Lark on 10/06/2021 at 1:36:38 PM.    Final    ? ?Procedures ?Procedures  ? ? ?Medications Ordered in ED ?Medications - No data to display ? ?ED Course/ Medical Decision Making/ A&P ?  ?                        ?Medical Decision Making ? ?Patient presents to the emergency department requesting a new ostomy bag.  He is nontoxic and resting comfortably, vitals are within normal limits.  Chart reviewed for additional history, multiple visits for same.  Ostomy bag was changed by nursing staff, patient feels much better.  He has no complaints of vomiting or  abdominal pain.  His abdomen is soft and nontender to palpation. No signs of infection at this time.  He overall seems reasonable for discharge at this time. ? ? ? ? ? ? ? ?Final Clinical Impression(s) / ED Diagnoses ?Final diagnoses:  ?Ileostomy bag changed (HCC)  ? ? ?Rx / DC Orders ?ED Discharge Orders   ? ? None  ? ?  ? ? ?  ?Reinhold Rickey R, PA-C ?03/

## 2021-10-08 NOTE — ED Triage Notes (Addendum)
Patient here requesting new colostomy bag, states he changed his bag at 0100 this morning and it is leaking. States he can get a new bag from his PCP's office but they do not open until 0830. Patient is alert, oriented, ambulatory, and in no apparent distress. ? ?Patient states he needs to use restroom, plan to measure vital signs after patient returns from restroom. ?

## 2021-10-08 NOTE — ED Notes (Signed)
Patient rude due to there are no more sandwiches and bus passes  ?

## 2021-10-08 NOTE — ED Triage Notes (Signed)
Patient requesting new colostomy bag. No pain. ?

## 2021-10-08 NOTE — ED Notes (Addendum)
Patient has not injured to his ostomy. Patient given a new bag and told that he only gets one. He states he understands. ?

## 2021-10-09 ENCOUNTER — Telehealth: Payer: Self-pay

## 2021-10-09 NOTE — Telephone Encounter (Signed)
Patient came to clinic today and picked up additional ostomy supplies. He had picked up some supplies yesterday but said his girlfriend has them in her car and she is out of town until the end of this week.  ? ?Provided him with information about his appointment with Dr Nadara Mustard 10/12/2021 and instructed him to call that clinic to determine the reason for the appointment. He was not sure what this appointment is going to address. He said he still wants to have the ostomy reversed. Reminded him to call DSS regarding transportation to that appointment if he does not have a ride and stressed the importance of keeping that appointment.  He said that he has the information that I gave to him about DSS and scheduling out of county rides to medical appointments  ?

## 2021-10-16 ENCOUNTER — Telehealth: Payer: Self-pay | Admitting: Family Medicine

## 2021-10-16 ENCOUNTER — Ambulatory Visit: Payer: Self-pay | Admitting: *Deleted

## 2021-10-16 NOTE — Telephone Encounter (Signed)
Third attempt to contact patient- no answer- left message to call office for triage for leg. ?

## 2021-10-16 NOTE — Telephone Encounter (Signed)
Summary: pt states an emergency as can't walk and in bed with no, not any, ostomy supplies. very upset,  ? Pt states he is completely out of ostomy supplies, it is an emergency as he has hurt his leg and can not walk. States he is laying there with no supplies at all. Pt states that Erskine Squibb needs to be contacted immediately as is in a bad way and she needs to send a delivery to him asap. Pt fu # is [phone] Pt is very upset.   ?  ? Attempted to call patient to get information regarding supply needs and possible triage leg injury ?

## 2021-10-16 NOTE — Telephone Encounter (Addendum)
I have spoken to the patient three times  today and informed him that there is no one in this office that can deliver ostomy supplies to him. He said he has absolutely no one that can come to [facility] to pick them up today and he can't come because he needs to stay in bed. He stated that he injured his leg, not sure how he injured it. He said he went to ED but there is no record in Epic of him in ED at [facility] since 10/08/2021.  ? ?I checked with our community paramedics and neither of them can deliver supplies to the patient today and I explained this to him.  He said that he may need to go to ED then if he has no supplies.   ? ?I told him that I would check with the congregational nurse program to see if someone is available to deliver supplies.  As per Lisette Abu, RN, she will check with [facility] to see if anyone is available to assist today, if not, she may be able to deliver the supplies tomorrow. I then called and explained this to the patient and told him I would call back with more information.  ? ? ? ?He missed his appointment on 10/12/2021 with [place] that was addressing ostomy reversal.  ?

## 2021-10-16 NOTE — Telephone Encounter (Signed)
Call placed to patient, message left with call back requested.  ? ?Want to inform him that the congregational nurse was not able to get someone to bring the supplies to him this afternoon but she can have someone bring them tomorrow.  I need to know if that will be okay before sending someone tomorrow.  ?

## 2021-10-16 NOTE — Telephone Encounter (Signed)
Patient requesting to speak with Erskine Squibb regarding supplies.  ?

## 2021-10-16 NOTE — Telephone Encounter (Signed)
Patient called, left VM to return the call to the office to discuss symptoms with a nurse. I also sent Erskine Squibb a message via Teams to see if she can attempt to reach out to pt.  ? ?Summary: pt states an emergency as can't walk and in bed with no, not any, ostomy supplies. very upset,  ? Pt states he is completely out of ostomy supplies, it is an emergency as he has hurt his leg and can not walk. States he is laying there with no supplies at all. Pt states that Erskine Squibb needs to be contacted immediately as is in a bad way and she needs to send a delivery to him asap. Pt fu # is [phone] Pt is very upset.   ?  ? ?

## 2021-10-17 ENCOUNTER — Telehealth: Payer: Self-pay | Admitting: Family Medicine

## 2021-10-17 NOTE — Telephone Encounter (Signed)
The patient has been to [facility] and I met with him since this call was received ?

## 2021-10-17 NOTE — Telephone Encounter (Signed)
Copied from [place] [id number]. Topic: General - Other >> Oct 17, 2021  9:11 AM Yvette Rack wrote: Reason for CRM: Pt stated he was returning call to [facility]. Attempted to transfer pt to the office but there was no answer. Pt requests that Opal Sidles return his call. Cb# [phone]

## 2021-10-17 NOTE — Telephone Encounter (Signed)
Patient came to the clinic today and picked up ostomy supplies. Reminded him that he has no more skin barriers and next supply delivery will not be for at least another week. He needs to take are of the supplies that he has with him  ? ?Encouraged him to contact [facility] to re-schedule his appointment with GI to discuss ostomy reversal and he said he would call today but he has a lot going on.   ?

## 2021-10-18 ENCOUNTER — Telehealth: Payer: Self-pay

## 2021-10-18 ENCOUNTER — Other Ambulatory Visit: Payer: Self-pay | Admitting: Family Medicine

## 2021-10-18 DIAGNOSIS — M7989 Other specified soft tissue disorders: Secondary | ICD-10-CM

## 2021-10-18 DIAGNOSIS — I872 Venous insufficiency (chronic) (peripheral): Secondary | ICD-10-CM

## 2021-10-18 NOTE — Telephone Encounter (Signed)
Message received from [facility] that patient called and requested a call back. ? ?Call returned to patient # [phone], message left with call back requested to this CM ?

## 2021-10-22 ENCOUNTER — Other Ambulatory Visit: Payer: Self-pay

## 2021-10-22 ENCOUNTER — Emergency Department (HOSPITAL_COMMUNITY)
Admission: EM | Admit: 2021-10-22 | Discharge: 2021-10-22 | Disposition: A | Discharge status: Home / Self Care | Payer: Medicare Other | Attending: Emergency Medicine | Admitting: Emergency Medicine

## 2021-10-22 ENCOUNTER — Encounter (HOSPITAL_COMMUNITY): Payer: Self-pay

## 2021-10-22 DIAGNOSIS — Z760 Encounter for issue of repeat prescription: Secondary | ICD-10-CM | POA: Insufficient documentation

## 2021-10-22 DIAGNOSIS — Z432 Encounter for attention to ileostomy: Secondary | ICD-10-CM | POA: Diagnosis not present

## 2021-10-22 DIAGNOSIS — I82401 Acute embolism and thrombosis of unspecified deep veins of right lower extremity: Secondary | ICD-10-CM | POA: Diagnosis not present

## 2021-10-22 NOTE — Telephone Encounter (Signed)
Copied from [place] [phone]. Topic: General - Other ?>> Oct 18, 2021 10:13 AM McGill, Nelva Bush wrote: ?Reason for CRM: Pt requesting to speak with Opal Sidles.  Pt requests that Opal Sidles return his call. Cb# [phone] ? ?Pt declined to provide any further details. ?

## 2021-10-22 NOTE — ED Notes (Signed)
Provided patient with new ostomy bag and benzoin swabs ?
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
## 2021-10-22 NOTE — Discharge Instructions (Signed)
Please return to the emergency department with any new or worsening symptoms.

## 2021-10-22 NOTE — Telephone Encounter (Signed)
This message is dated 10/18/2021.  I have left a message for patient since then.  ?

## 2021-10-22 NOTE — ED Triage Notes (Addendum)
Patient states he needs a new ostomy bag.  ?

## 2021-10-23 ENCOUNTER — Telehealth: Payer: Self-pay
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
## 2021-10-23 NOTE — Telephone Encounter (Signed)
Pt called in about Ostomy supplies, says order shouldve have been placed last week. I gave him the message about order being placed from message today. Pt requesting cb. ?

## 2021-10-23 NOTE — Telephone Encounter (Signed)
Called [facility] # [phone], spoke to Maralyn Sago and placed order for more ostomy supplies.  She said she will request expedited shipping.  Delivery address of [facility] confirmed. She also explained that they are converting over to [employer] supplies. Beginning with the next order, the wipes and powder will be [employer] and the other supplies will be changed over in September 2023. ?

## 2021-10-23 NOTE — Telephone Encounter (Signed)
PT wants FU call from Opal Sidles, states to call at [phone] ?

## 2021-10-24 ENCOUNTER — Other Ambulatory Visit: Payer: Self-pay

## 2021-10-24 ENCOUNTER — Ambulatory Visit: Payer: Medicare Other | Admitting: Physician Assistant

## 2021-10-24 ENCOUNTER — Emergency Department (HOSPITAL_BASED_OUTPATIENT_CLINIC_OR_DEPARTMENT_OTHER): Payer: Medicare Other

## 2021-10-24 ENCOUNTER — Emergency Department (HOSPITAL_COMMUNITY)
Admission: EM | Admit: 2021-10-24 | Discharge: 2021-10-24 | Disposition: A | Discharge status: Home / Self Care | Payer: Medicare Other | Attending: Emergency Medicine | Admitting: Emergency Medicine

## 2021-10-24 DIAGNOSIS — R1084 Generalized abdominal pain: Secondary | ICD-10-CM | POA: Diagnosis not present

## 2021-10-24 DIAGNOSIS — Z7901 Long term (current) use of anticoagulants: Secondary | ICD-10-CM | POA: Insufficient documentation

## 2021-10-24 DIAGNOSIS — N1832 Chronic kidney disease, stage 3b: Secondary | ICD-10-CM | POA: Insufficient documentation

## 2021-10-24 DIAGNOSIS — I82591 Chronic embolism and thrombosis of other specified deep vein of right lower extremity: Secondary | ICD-10-CM | POA: Diagnosis not present

## 2021-10-24 DIAGNOSIS — I82401 Acute embolism and thrombosis of unspecified deep veins of right lower extremity: Secondary | ICD-10-CM | POA: Diagnosis not present

## 2021-10-24 DIAGNOSIS — M79604 Pain in right leg: Secondary | ICD-10-CM | POA: Diagnosis not present

## 2021-10-24 DIAGNOSIS — I825Z1 Chronic embolism and thrombosis of unspecified deep veins of right distal lower extremity: Secondary | ICD-10-CM

## 2021-10-24 LAB — CBC WITH DIFFERENTIAL/PLATELET
Abs Immature Granulocytes: 0.02 10*3/uL (ref 0.00–0.07)
Basophils Absolute: 0 10*3/uL (ref 0.0–0.1)
Basophils Relative: 0 %
Eosinophils Absolute: 0.5 10*3/uL (ref 0.0–0.5)
Eosinophils Relative: 5 %
HCT: 37.4 % — ABNORMAL LOW (ref 39.0–52.0)
Hemoglobin: 11.8 g/dL — ABNORMAL LOW (ref 13.0–17.0)
Immature Granulocytes: 0 %
Lymphocytes Relative: 22 %
Lymphs Abs: 2.5 10*3/uL (ref 0.7–4.0)
MCH: 28.2 pg (ref 26.0–34.0)
MCHC: 31.6 g/dL (ref 30.0–36.0)
MCV: 89.3 fL (ref 80.0–100.0)
Monocytes Absolute: 1.1 10*3/uL — ABNORMAL HIGH (ref 0.1–1.0)
Monocytes Relative: 10 %
Neutro Abs: 7 10*3/uL (ref 1.7–7.7)
Neutrophils Relative %: 63 %
Platelets: 247 10*3/uL (ref 150–400)
RBC: 4.19 MIL/uL — ABNORMAL LOW (ref 4.22–5.81)
RDW: 15 % (ref 11.5–15.5)
WBC: 11.1 10*3/uL — ABNORMAL HIGH (ref 4.0–10.5)
nRBC: 0 % (ref 0.0–0.2)

## 2021-10-24 LAB — COMPREHENSIVE METABOLIC PANEL
ALT: 27 U/L (ref 0–44)
AST: 29 U/L (ref 15–41)
Albumin: 3.5 g/dL (ref 3.5–5.0)
Alkaline Phosphatase: 85 U/L (ref 38–126)
Anion gap: 7 (ref 5–15)
BUN: 25 mg/dL — ABNORMAL HIGH (ref 6–20)
CO2: 21 mmol/L — ABNORMAL LOW (ref 22–32)
Calcium: 9.3 mg/dL (ref 8.9–10.3)
Chloride: 108 mmol/L (ref 98–111)
Creatinine, Ser: 1.89 mg/dL — ABNORMAL HIGH (ref 0.61–1.24)
GFR, Estimated: 45 mL/min — ABNORMAL LOW (ref >60)
Glucose, Bld: 99 mg/dL (ref 70–99)
Potassium: 3.6 mmol/L (ref 3.5–5.1)
Sodium: 136 mmol/L (ref 135–145)
Total Bilirubin: 0.7 mg/dL (ref 0.3–1.2)
Total Protein: 7.1 g/dL (ref 6.5–8.1)

## 2021-10-24 LAB — LIPASE, BLOOD: Lipase: 36 U/L (ref 11–51)

## 2021-10-24 NOTE — Discharge Planning (Signed)
Ostomy supplies will be at [facility] entrance for patient pick-up. ?

## 2021-10-24 NOTE — Telephone Encounter (Signed)
Patient did not show for appointment. Patient will need to follow up with PCP office.

## 2021-10-24 NOTE — ED Notes (Signed)
Pt refused to have his vitals taken before departure. Pt refused to get dressed to leave. Security at bedside escorting pts out.  ?
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
## 2021-10-24 NOTE — Telephone Encounter (Signed)
Patient request a call back from Laural Benes please call Ph# [phone] ?

## 2021-10-24 NOTE — ED Notes (Signed)
I was able to get vitals from this patient. I informed the pt that a urine sample was needed, to which he turned over and went back to sleep.  ?

## 2021-10-24 NOTE — Telephone Encounter (Signed)
Routing to our nurse case manager for review. ?

## 2021-10-24 NOTE — Telephone Encounter (Signed)
I have spoken to patient since this call was received and the information was documented in another telephone call encounter. ?

## 2021-10-24 NOTE — ED Notes (Signed)
Pt has refused vitals and has refused to be hooked up to the mon.  ?

## 2021-10-24 NOTE — ED Triage Notes (Signed)
BIB [facility] after pt c/o of increase R leg pain since 3 am. Pt refused vitals with EMS.  ?
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
## 2021-10-24 NOTE — Telephone Encounter (Signed)
Call returned to patient.  He explained that he had to go to the hospital last night. His right leg is still swollen and he needs the lasix. He has an appointment at [facility] this afternoon at 1500 and he said he would be there, he knows it is in person.  ? ?He asked about getting his medications and I explained that if he is not able to pick them up at his pharmacy, he can transfer them to a pharmacy that will deliver.  ? ?Regarding ostomy supplies, I explained that  I contacted Maple Hudson, FNP yesterday and she was going to leave some supplies at the [facility] by 1000 today.  He said no one in the ED  told him to pick them up at the [facility]. Explained to him that the supplies should still be there.  He can call the hospital main number and check with that desk.  I also informed him that the delivery from [facility] has not yet arrived at [facility].   ? ?He spoke about housing and said that he is still at the [facility] until Sunday, 10/28/2021 and April, the case manager there found him an apartment. He is supposed to speak with April tomorrow to find out more information about the apartment and when he can move in.  ?

## 2021-10-24 NOTE — ED Notes (Signed)
;  pt first attempt unsuccessful will try later, pt has label specimen cup ?

## 2021-10-24 NOTE — Progress Notes (Signed)
Lower extremity venous has been completed.  ? ?Preliminary results in CV Proc.  ? ?Raymond Bowen Raymond Bowen ?10/24/2021 9:20 AM    ?

## 2021-10-25 ENCOUNTER — Telehealth: Payer: Self-pay

## 2021-10-25 NOTE — Telephone Encounter (Signed)
Attempted to contact patient [phone] to inform him that his ostomy supplies have been delivered to [facility] but voicemail was not set up. ? ?He needs to know that he can pick them up this afternoon until 5:30 pm and that the clinic will be closed tomorrow - 10/26/2021  ?

## 2021-10-30 NOTE — Telephone Encounter (Signed)
Attempted again to contact patient [phone] to inform him that his ostomy supplies have been delivered to [facility] but voicemail was not set up. ?

## 2021-11-05 ENCOUNTER — Telehealth: Payer: Self-pay | Admitting: Family Medicine

## 2021-11-05 NOTE — Telephone Encounter (Signed)
Pt is requesting an urgent call back from Erskine Squibb regarding his supplies, he says he will not be able to pick them up until after 5:30 pm.  ?

## 2021-11-05 NOTE — Telephone Encounter (Signed)
Pt made an additional call to speak with Erskine Squibb, please advise.  ?

## 2021-11-05 NOTE — Telephone Encounter (Signed)
I have spoken to the patient multiple times today. He said he had no ride to get to [facility] to pick up his ostomy supplies before the clinic closes; but he can take a bus to the hospital. I explained to him that there will be supplies waiting for him at the front desk of [facility] ED.  I spoke to Laurena Slimmer, RN CM and informed her of the plan for patient to pick up his supplies and she said she will be in contact with the ED check in desk.  Wandalyn's name was put on the bag of supplies  ? ?The supplies were dropped off at the ED and then the patient called and said he got a ride to the clinic.  Explained to him that the supplies have been dropped off at the ED desk and he can ask for [facility] if he has any questions. ?

## 2021-11-05 NOTE — Telephone Encounter (Signed)
Patient is returning a call to Robyne Peers, RN about his Ostomy supplies. He may not be able to pick them up before 5:30 and wanted her to call him back. ?

## 2021-11-06 ENCOUNTER — Telehealth: Payer: Self-pay
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
## 2021-11-09 NOTE — Telephone Encounter (Signed)
Pt requested a call back from [place] regarding his supplies, please advise.  ?

## 2021-11-09 NOTE — Telephone Encounter (Signed)
Forwarding request to Erskine Squibb.  ?

## 2021-11-09 NOTE — Telephone Encounter (Signed)
Pt returned call regarding his ostomy supplies, requested to speak to [place].  ?

## 2021-11-12 NOTE — Telephone Encounter (Signed)
Attempted to call patient back, his voicemail is not set up. ?The patient did come to [facility] on 11/09/2021 and picked up ostomy supplies.  ?

## 2021-11-13 ENCOUNTER — Telehealth: Payer: Self-pay

## 2021-11-13 NOTE — Telephone Encounter (Signed)
Patient came to the clinic today and picked up more of his ostomy bags and wafers ? ?

## 2021-11-20 ENCOUNTER — Telehealth: Payer: Self-pay

## 2021-11-20 NOTE — Telephone Encounter (Signed)
Patient came to clinic today and picked up the remainder of his ostomy bags and wafers. Informed him that this is the end of the supplies that we have and I will place a new order.  ? ?Called [facility] # [phone] to order more ostomy supplies for patient, left message for Maralyn Sago.  Call back requested ?

## 2021-11-21 ENCOUNTER — Other Ambulatory Visit: Payer: Self-pay | Admitting: Family Medicine

## 2021-11-21 DIAGNOSIS — I872 Venous insufficiency (chronic) (peripheral): Secondary | ICD-10-CM

## 2021-11-21 DIAGNOSIS — M7989 Other specified soft tissue disorders: Secondary | ICD-10-CM

## 2021-11-22 NOTE — Telephone Encounter (Signed)
Call placed to [facility]  [phone] to order more ostomy supplies for patient, left message for Judson Roch.  Call back requested  ? ?I called [facility] main number [phone], spoke to [place] and placed the request for supplies. I confirmed with her that the supplies would be sent to [facility]  ?

## 2021-11-23 NOTE — Telephone Encounter (Signed)
Able to reach patient.  ?Patient aware that supplies will be at [facility] hospital main entrance ?

## 2021-11-23 NOTE — Telephone Encounter (Signed)
Attempt to call patient to inform ostomy supply will be left at the [facility] at [facility].  ? ?Unable to reach.  ?

## 2021-11-23 NOTE — Telephone Encounter (Signed)
noted 

## 2021-11-23 NOTE — Telephone Encounter (Signed)
Pt is calling to request Erskine Squibb to call him Regarding his supplies Cb- [phone] ?

## 2021-11-29 ENCOUNTER — Telehealth: Payer: Self-pay

## 2021-11-29 NOTE — Telephone Encounter (Signed)
Called patient # [phone] to inform him that his ostomy supplies were delivered to [facility].  The recording on the phone said that the call could not be completed at this time ?

## 2021-12-03 ENCOUNTER — Telehealth: Payer: Self-pay

## 2021-12-03 NOTE — Telephone Encounter (Signed)
Message received from [facility], Interior and spatial designer of Programs/IRC on 11/30/2021, stating that they have tried to reach the patient.  He [facility] with someone and if he returned to [place] by today, 12/03/2021 they have a shelter bed at the [facility] available for him.  ?The program for temporary housing at [facility] is closing today.  ? ?I tried calling the patient twice # [phone] and the recording stated that the call cannot be completed at this time  ?

## 2021-12-03 NOTE — Telephone Encounter (Signed)
Per Guy Franco, RN/ Team Lead,  ostomy supplies were left at the front desk at the [facility] at [facility] for the patient to pick up on 11/30/2021.  These were the remaining supplies from the most recent order delivered last week. He does not have any supplies left at [facility] ?

## 2021-12-19 ENCOUNTER — Telehealth: Payer: Self-pay

## 2021-12-19 NOTE — Telephone Encounter (Signed)
I attempted to contact patient # [phone] to inquire if he would like his next order of ostomy supplies delivered to [facility] or an address when he is now staying. The recording stated that the call could not be completed at this time

## 2021-12-20 NOTE — Telephone Encounter (Signed)
I attempted to contact patient # [phone] to inform him that I would be ordering ostomy supplies for him and they will be delivered to [facility] as I have not been able to reach him.  The recording on his phone stated that the call could not be completed at this time.    I called [facility] # [phone], spoke to Maralyn Sago and placed the order for ostomy supplies. She confirmed that the will be sent to [facility].

## 2021-12-24 ENCOUNTER — Other Ambulatory Visit: Payer: Self-pay

## 2021-12-24 NOTE — Telephone Encounter (Signed)
Pt is wanting to talk to Raymond Bowen regarding his supplies. He states to call [phone]

## 2021-12-24 NOTE — Telephone Encounter (Signed)
Pt is very rude, pt continues to call regarding his supplies, please advise.

## 2021-12-24 NOTE — Telephone Encounter (Signed)
Copied from [place] [id number]. Topic: General - Other >> Dec 24, 2021 11:27 AM Raymond Bowen A wrote: Reason for CRM: The patient would like to speak with J Brazeau to coordinate the pick up of their ostomy supplies  Please contact further when possible

## 2021-12-24 NOTE — Telephone Encounter (Signed)
Call returned to patient # [phone], message left with call back requested

## 2021-12-24 NOTE — Telephone Encounter (Signed)
Pt wants to tell Erskine Squibb she must call before 5:00. RE previous

## 2021-12-25 ENCOUNTER — Encounter (HOSPITAL_COMMUNITY): Payer: Self-pay

## 2021-12-25 ENCOUNTER — Other Ambulatory Visit: Payer: Self-pay

## 2021-12-25 ENCOUNTER — Emergency Department (HOSPITAL_COMMUNITY)
Admission: EM | Admit: 2021-12-25 | Discharge: 2021-12-25 | Disposition: A | Discharge status: Home / Self Care | Payer: Medicare Other | Attending: Emergency Medicine | Admitting: Emergency Medicine

## 2021-12-25 DIAGNOSIS — Z79899 Other long term (current) drug therapy: Secondary | ICD-10-CM | POA: Insufficient documentation

## 2021-12-25 DIAGNOSIS — R110 Nausea: Secondary | ICD-10-CM | POA: Diagnosis not present

## 2021-12-25 DIAGNOSIS — R112 Nausea with vomiting, unspecified: Secondary | ICD-10-CM | POA: Diagnosis not present

## 2021-12-25 DIAGNOSIS — R1084 Generalized abdominal pain: Secondary | ICD-10-CM | POA: Insufficient documentation

## 2021-12-25 LAB — CBC WITH DIFFERENTIAL/PLATELET
Abs Immature Granulocytes: 0.03 10*3/uL (ref 0.00–0.07)
Basophils Absolute: 0 10*3/uL (ref 0.0–0.1)
Basophils Relative: 0 %
Eosinophils Absolute: 0.3 10*3/uL (ref 0.0–0.5)
Eosinophils Relative: 3 %
HCT: 40.1 % (ref 39.0–52.0)
Hemoglobin: 12.4 g/dL — ABNORMAL LOW (ref 13.0–17.0)
Immature Granulocytes: 0 %
Lymphocytes Relative: 15 %
Lymphs Abs: 1.5 10*3/uL (ref 0.7–4.0)
MCH: 28.5 pg (ref 26.0–34.0)
MCHC: 30.9 g/dL (ref 30.0–36.0)
MCV: 92.2 fL (ref 80.0–100.0)
Monocytes Absolute: 1 10*3/uL (ref 0.1–1.0)
Monocytes Relative: 9 %
Neutro Abs: 7.5 10*3/uL (ref 1.7–7.7)
Neutrophils Relative %: 73 %
Platelets: 234 10*3/uL (ref 150–400)
RBC: 4.35 MIL/uL (ref 4.22–5.81)
RDW: 16.1 % — ABNORMAL HIGH (ref 11.5–15.5)
WBC: 10.4 10*3/uL (ref 4.0–10.5)
nRBC: 0 % (ref 0.0–0.2)

## 2021-12-25 LAB — COMPREHENSIVE METABOLIC PANEL
ALT: 25 U/L (ref 0–44)
AST: 28 U/L (ref 15–41)
Albumin: 3.1 g/dL — ABNORMAL LOW (ref 3.5–5.0)
Alkaline Phosphatase: 81 U/L (ref 38–126)
Anion gap: 8 (ref 5–15)
BUN: 15 mg/dL (ref 6–20)
CO2: 28 mmol/L (ref 22–32)
Calcium: 9.1 mg/dL (ref 8.9–10.3)
Chloride: 106 mmol/L (ref 98–111)
Creatinine, Ser: 1.9 mg/dL — ABNORMAL HIGH (ref 0.61–1.24)
GFR, Estimated: 44 mL/min — ABNORMAL LOW (ref >60)
Glucose, Bld: 93 mg/dL (ref 70–99)
Potassium: 3.6 mmol/L (ref 3.5–5.1)
Sodium: 142 mmol/L (ref 135–145)
Total Bilirubin: 0.6 mg/dL (ref 0.3–1.2)
Total Protein: 6.9 g/dL (ref 6.5–8.1)

## 2021-12-25 LAB — LIPASE, BLOOD: Lipase: 41 U/L (ref 11–51)

## 2021-12-25 MED ORDER — LIDOCAINE VISCOUS HCL 2 % MT SOLN
15.0000 mL | Freq: Once | OROMUCOSAL | Status: AC
  Administered 2021-12-25: 15 mL via ORAL
  Filled 2021-12-25: qty 15

## 2021-12-25 MED ORDER — ALUM & MAG HYDROXIDE-SIMETH 200-200-20 MG/5ML PO SUSP
30.0000 mL | Freq: Once | ORAL | Status: AC
  Administered 2021-12-25: 30 mL via ORAL
  Filled 2021-12-25: qty 30

## 2021-12-25 NOTE — ED Triage Notes (Signed)
Pt BIBA c/o generalized abd pain with N/V x1 day after eating hotdogs last night.

## 2021-12-25 NOTE — Telephone Encounter (Signed)
Pt returning phone call ° °

## 2021-12-25 NOTE — ED Notes (Signed)
Pt upset about not receiving Colostomy bag. Educated pt that the ER can't supply him with a new colostomy bag at this time and to reference the previously given resources.

## 2021-12-25 NOTE — ED Provider Notes (Signed)
[facility] EMERGENCY DEPARTMENT Provider Note   CSN: [id number] Arrival date & time: 12/25/21  4098     History  Chief Complaint  Patient presents with   Abdominal Pain    ARIYON MITTLEMAN is a 46 y.o. male.  HPI  46 year old male presents to the emergency department with abdominal pain, nausea/vomiting after eating hot dog last night.  Patient denies any chest pain, difficulty breathing, fever, vomiting or diarrhea.  Patient has history of previous colon resection, laparotomy and ileostomy.  He currently has no genitourinary symptoms.  Feels mild nausea but cannot recall the last time he had vomiting.  Home Medications Prior to Admission medications   Medication Sig Start Date End Date Taking? Authorizing Provider  DULoxetine (CYMBALTA) 60 MG capsule Take 1 capsule (60 mg total) by mouth daily. Patient not taking: Reported on 07/14/2021 03/29/21   Hoy Register, MD  furosemide (LASIX) 20 MG tablet TAKE 1 TABLET BY MOUTH ONCE DAILY NEEDED FOR LEG SWELLING. APPOINTMENT REQUIRED FOR FUTURE REFILLS Patient not taking: Reported on 07/14/2021 05/20/21   Hoy Register, MD  methocarbamol (ROBAXIN) 500 MG tablet Take 1 tablet (500 mg total) by mouth every 8 (eight) hours as needed for muscle spasms. Patient not taking: Reported on 07/14/2021 03/29/21   Hoy Register, MD  Misc. Devices MISC Colostomy bags. 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 Patient not taking: Reported on 09/16/2021 03/29/21   Hoy Register, MD  tamsulosin (FLOMAX) 0.4 MG CAPS capsule Take 1 capsule (0.4 mg total) by mouth daily. Patient not taking: Reported on 07/14/2021 04/12/21   Crista Elliot, MD      Allergies    Shellfish allergy, Biopatch protective disk-chg [chlorhexidine], and Vancomycin    Review of Systems   Review of Systems  Constitutional:  Positive for fatigue. Negative for fever.  Respiratory:  Negative for shortness of breath.   Cardiovascular:  Negative for chest pain.  Gastrointestinal:  Positive for abdominal pain, nausea and vomiting. Negative for diarrhea.  Skin:  Negative for rash.  Neurological:  Negative for headaches.   Physical Exam Updated Vital Signs BP 132/82   Pulse 80   Temp 98.1 F (36.7 C) (Oral)   Resp 16   Ht 5\' 7"  (1.702 m)   Wt 77.1 kg   SpO2 100%   BMI 26.63 kg/m  Physical Exam Vitals and nursing note reviewed.  Constitutional:      General: He is not in acute distress.    Appearance: Normal appearance.  HENT:     Head: Normocephalic.     Mouth/Throat:     Mouth: Mucous membranes are moist.  Cardiovascular:     Rate and Rhythm: Normal rate.  Pulmonary:     Effort: Pulmonary effort is normal. No respiratory distress.  Abdominal:     Palpations: Abdomen is soft.     Tenderness: There is no abdominal tenderness. There is no guarding or rebound.  Skin:    General: Skin is warm.  Neurological:     Mental Status: He is alert and oriented to person, place, and time. Mental status is at baseline.  Psychiatric:        Mood and Affect:  Mood normal.    ED Results / Procedures / Treatments   Labs (all labs ordered are listed, but only abnormal results are displayed) Labs Reviewed  CBC WITH DIFFERENTIAL/PLATELET - Abnormal; Notable for the following components:      Result Value   Hemoglobin 12.4 (*)    RDW 16.1 (*)    All other components within normal limits  COMPREHENSIVE METABOLIC PANEL - Abnormal; Notable for the following components:   Creatinine, Ser 1.90 (*)    Albumin 3.1 (*)    GFR, Estimated 44 (*)    All other components within normal limits  LIPASE, BLOOD    EKG None  Radiology No results  found.  Procedures Procedures    Medications Ordered in ED Medications  alum & mag hydroxide-simeth (MAALOX/MYLANTA) 200-200-20 MG/5ML suspension 30 mL (30 mLs Oral Given 12/25/21 1249)    And  lidocaine (XYLOCAINE) 2 % viscous mouth solution 15 mL (15 mLs Oral Given 12/25/21 1249)    ED Course/ Medical Decision Making/ A&P                           Medical Decision Making Amount and/or Complexity of Data Reviewed Labs: ordered.  Risk OTC drugs. Prescription drug management.   46 year old male presents to the emergency department with concern for generalized abdominal pain and resolved nausea/vomiting after eating a hot dog last night.  Vitals are stable on arrival.  Patient has rather nonspecific complaints, abdominal exam reveals chronic scarring but is benign.  He is passing gas, having bowel movements, no distention.  No findings of obstruction.  Blood work is baseline for the patient.  After dose of medicine he has been able to p.o. without difficulty, offers no new concerns or complaints.  Patient at this time appears safe and stable for discharge and close outpatient follow up. Discharge plan and strict return to ED precautions discussed, patient verbalizes understanding and agreement.        Final Clinical Impression(s) / ED Diagnoses Final diagnoses:  Nausea    Rx / DC Orders ED Discharge Orders     None         Rozelle Logan, DO 12/25/21 1516

## 2021-12-25 NOTE — ED Notes (Signed)
Pt in room resting comfortably. NAD chest rising and falling.

## 2021-12-25 NOTE — Telephone Encounter (Signed)
Patient came to [facility] from ED to pick up ostomy supplies. He did not want to take all of the supplies that were delivered, so he took a box of ostomy bags and a box of skin barriers,tape and said he would come back to pick up the other supplies.  He explained that he is staying with a friend. He has not been able to reach April/ [facility] about permanent housing.  He also said he had a problem with the deposit of his disability check.  I provided him with the phone number to call [facility] and he said he will call to order his supplies. I reminded him to let them know where he wants them delivered when he calls to place the order. I also told him that if he changes his mind and would like to have me order them and/ or have them delivered to [facility] to just let me know and he said he understood.  I reminded him again that he has the option to meet with the surgeon about reversal of the ostomy.

## 2021-12-25 NOTE — ED Notes (Signed)
Pt given ginger ale for PO chal. Pt tolerated well. Fell back asleep.

## 2021-12-25 NOTE — Discharge Instructions (Signed)
You have been seen and discharged from the emergency department.  Your blood work was normal for you.  Take nausea medicine as needed.  Follow-up with your primary provider for further evaluation and further care. Take home medications as prescribed. If you have any worsening symptoms or further concerns for your health please return to an emergency department for further evaluation.

## 2021-12-25 NOTE — ED Notes (Signed)
Pt reports he has been out of his lasix x1 month. R leg swollen.

## 2021-12-25 NOTE — Telephone Encounter (Signed)
Call returned to patient # [phone]  to inform him that the supplies have been delivered. Message left with call back requested

## 2021-12-31 ENCOUNTER — Telehealth: Payer: Self-pay

## 2021-12-31 NOTE — Telephone Encounter (Signed)
Call received from patient stating he needs ostomy skin barrier, tape but he cannot get to [facility] before the clinic closes at 1730 to pick them up. He said he could pick them up at the [facility] ED registration desk this evening.  I told him that I would leave the box of supplies he needs at the registration desk. He said he does not need ostomy bags and did not want the remainder of supplies that he has at [facility].  I reminded him that he has the phone number for [facility] and he needs to call them when he is ready to re-order supplies.   After speaking with Oletta Cohn, RN CM, I left the supplies the patient requested at the ED registration desk.

## 2022-01-07 ENCOUNTER — Telehealth: Payer: Self-pay | Admitting: Family Medicine

## 2022-01-07 NOTE — Telephone Encounter (Signed)
The patient has made an additional call to request to speak with Erskine Squibb when possible  Please contact further when available

## 2022-01-07 NOTE — Telephone Encounter (Signed)
Copied from CRM [id number]. Topic: General - Other >> Jan 07, 2022  1:04 PM Ja-Kwan M wrote: Reason for CRM: Pt requests call back from [place]. Pt stated he needs Erskine Squibb to call the nurse regarding getting his supplies. Pt requests call back asap. Cb# [phone]

## 2022-01-07 NOTE — Telephone Encounter (Signed)
Patient came to [facility] this afternoon and picked up the remainder of his ostomy supply delivery. He said that going forward, he will call [facility] and request his order. He confirmed he has the phone number or [facility] and he has an address for supplies to be delivered. They will not be delivered to [facility].  He is currently staying in a motel looking for permanent housing. He said he is dealing with a lot with his children and wants to have the ostomy reversed but not is not the best time yet

## 2022-01-07 NOTE — Telephone Encounter (Signed)
Copied from CRM #[id number]. Topic: General - Other >> Jan 07, 2022  1:04 PM Ja-Kwan M wrote: Reason for CRM: Pt requests call back from Jane. Pt stated he needs Jane to call the nurse regarding getting his supplies. Pt requests call back asap. Cb# [phone] 

## 2022-01-07 NOTE — Telephone Encounter (Signed)
I called patient back.. he was upset stating that he has no supplies. I explained that we have supplies for him in the clinic and he can pick them up before 5:30 pm today and he said he would be there.

## 2022-01-21 ENCOUNTER — Telehealth: Payer: Self-pay | Admitting: Emergency Medicine

## 2022-01-21 NOTE — Telephone Encounter (Signed)
I have spoken to patient.  The information is documented in another telephone encounter

## 2022-01-21 NOTE — Telephone Encounter (Signed)
Call routed to [facility]

## 2022-01-21 NOTE — Telephone Encounter (Signed)
Pt wants Erskine Squibb to FU re his supplies, advised pt of note re [facility] but he states that is why he is calling as he does not have their info and needs supplies asap [phone]

## 2022-01-21 NOTE — Telephone Encounter (Signed)
I called patient and informed him that Raymond Hudson, FNP is not at the hospital today and will not be back until 01/24/2031.  He was appreciative of the information.  I instructed him to call me on 7/5 if he has not received his supplies from [facility] and needs to get in contact with Ms Allyne Gee, FNP.

## 2022-01-21 NOTE — Telephone Encounter (Signed)
I spoke to the patient and he said he called [facility] to order supplies but it will be a few days before he receives them and he needs wafers.  I told him I would check with Maple Hudson, NP to see if she has any samples available and would call him back. This clinic has no sample ostomy wafers.

## 2022-01-21 NOTE — Telephone Encounter (Signed)
Pt requested to speak directly with Erskine Squibb.

## 2022-01-22 ENCOUNTER — Emergency Department (HOSPITAL_COMMUNITY)
Admission: EM | Admit: 2022-01-22 | Discharge: 2022-01-22 | Discharge status: Absent without leave | Payer: Medicare Other | Source: Home / Self Care

## 2022-01-24 ENCOUNTER — Telehealth: Payer: Self-pay

## 2022-01-24 NOTE — Telephone Encounter (Signed)
I called Maralyn Sago Madison Hickman and provided her with the new address for patient:  [address] [place] [address].  She said she would call back if there are any further questions, otherwise they will ship the order out.

## 2022-01-24 NOTE — Telephone Encounter (Addendum)
I have not received any telephone encounters that the patient called yesterday or today. I have attempted to reach him three times this morning # [phone] and the voicemail was not set up.

## 2022-01-24 NOTE — Telephone Encounter (Signed)
I was finally able to reach him and he said he needs supplies, he has not received the shipment from [facility]. I told him that I contacted Maple Hudson, FNP and she will leave him some sample supplies at the main desk at [facility] where she has left them for him in the past.  He said said he understood and would pick them up today.  I told him I would call [facility] and check on the status of his order.  He said the supplies are to be delivered to [address], [place] [address].

## 2022-01-24 NOTE — Telephone Encounter (Signed)
Patient calling to update mailing address for medical supplies  Patient states his address is [address] [place] [address]

## 2022-01-24 NOTE — Telephone Encounter (Signed)
I spoke to the patient and he picked up the ostomy supplies at [facility]. He also picked up a bus pass at the ED registration desk.  I spoke to Sarah/[facility] [phone] to check on status of supply order. She said that they have no record of the patient ordering supplies. I then placed a new order with instructions to deliver to patient at [address] [place] [address]. The patient corrected this address from an earlier conversation with me.  Maralyn Sago said that the supplies would be sent out tomorrow.   I then called patient back and explained above information from [facility] and to expect a delivery in a few days.   I then received a call from Sarah/[facility] stating that the address patient provided is not valid.  I called patient and explained and he will need to call me back with correct address.

## 2022-01-25 ENCOUNTER — Encounter (HOSPITAL_COMMUNITY): Payer: Self-pay | Admitting: Nurse Practitioner

## 2022-02-01 ENCOUNTER — Encounter (HOSPITAL_COMMUNITY): Payer: Self-pay | Admitting: Nurse Practitioner

## 2022-02-05 ENCOUNTER — Telehealth: Payer: Self-pay

## 2022-02-05 NOTE — Telephone Encounter (Signed)
I called patient to remind him to contact [facility] next week to place another order for ostomy supplies.  He said he has plenty of supplies and still has not picked up the delivery that was sent to the address he requested. He said he has 2 boxes of supplies  to pick up.   He should not need any supplies in the next 2-3 weeks at least.  If he needs supplies, he should contact [facility] [phone].  If he can also contact Maple Hudson, FNP/Ostomy Clinic at [facility]  [phone] for questions about his supply order.

## 2022-02-12 ENCOUNTER — Encounter (HOSPITAL_COMMUNITY): Payer: Self-pay | Admitting: Nurse Practitioner

## 2022-04-23 ENCOUNTER — Ambulatory Visit: Payer: Self-pay

## 2022-04-23 NOTE — Patient Outreach (Signed)
  Care Coordination   04/23/2022 Name: Raymond Bowen MRN: [id number] DOB: November 09, 1975   Care Coordination Outreach Attempts:  An unsuccessful telephone outreach was attempted today to offer the patient information about available care coordination services as a benefit of their health plan.   Follow Up Plan:  Additional outreach attempts will be made to offer the patient care coordination information and services.   Encounter Outcome:  No Answer  Care Coordination Interventions Activated:  No   Care Coordination Interventions:  No, not indicated    Daneen Schick, BSW, CDP Social Worker, Certified Dementia Practitioner [facility] Care Management  Care Coordination [phone]

## 2022-05-03 ENCOUNTER — Ambulatory Visit: Payer: Self-pay

## 2022-05-03 NOTE — Patient Outreach (Signed)
  Care Coordination   05/03/2022 Name: Raymond Bowen MRN: [id number] DOB: April 15, 1976   Care Coordination Outreach Attempts:  A second unsuccessful outreach was attempted today to offer the patient with information about available care coordination services as a benefit of their health plan.     Follow Up Plan:  Additional outreach attempts will be made to offer the patient care coordination information and services.   Encounter Outcome:  No Answer  Care Coordination Interventions Activated:  No   Care Coordination Interventions:  No, not indicated    Daneen Schick, BSW, CDP Social Worker, Certified Dementia Practitioner [facility] Care Management  Care Coordination [phone]

## 2022-05-07 ENCOUNTER — Ambulatory Visit: Payer: Self-pay

## 2022-05-07 NOTE — Patient Outreach (Signed)
  Care Coordination   05/07/2022 Name: Raymond Bowen MRN: [id number] DOB: [birth year]   Care Coordination Outreach Attempts:  A third unsuccessful outreach was attempted today to offer the patient with information about available care coordination services as a benefit of their health plan.   Follow Up Plan:  No further outreach attempts will be made at this time. We have been unable to contact the patient to offer or enroll patient in care coordination services  Encounter Outcome:  No Answer  Care Coordination Interventions Activated:  No   Care Coordination Interventions:  No, not indicated    Daneen Schick, BSW, CDP Social Worker, Certified Dementia Practitioner [place] Coordination [phone]

## 2022-06-12 ENCOUNTER — Emergency Department (HOSPITAL_COMMUNITY)
Admission: EM | Admit: 2022-06-12 | Discharge: 2022-06-12 | Disposition: A | Discharge status: Home / Self Care | Payer: Medicare Other | Attending: Emergency Medicine | Admitting: Emergency Medicine

## 2022-06-12 ENCOUNTER — Other Ambulatory Visit: Payer: Self-pay

## 2022-06-12 DIAGNOSIS — Z7901 Long term (current) use of anticoagulants: Secondary | ICD-10-CM | POA: Diagnosis not present

## 2022-06-12 DIAGNOSIS — K9409 Other complications of colostomy: Secondary | ICD-10-CM | POA: Diagnosis not present

## 2022-06-12 DIAGNOSIS — K9400 Colostomy complication, unspecified: Secondary | ICD-10-CM | POA: Insufficient documentation

## 2022-06-12 DIAGNOSIS — Z433 Encounter for attention to colostomy: Secondary | ICD-10-CM | POA: Diagnosis not present

## 2022-06-12 NOTE — ED Triage Notes (Signed)
Pt [facility] from jail on Monday with no ostomy supplies.  Bag changed in triage.

## 2022-06-12 NOTE — ED Provider Notes (Signed)
[facility]-EMERGENCY DEPT Provider Note   CSN: [id number] Arrival date & time: 06/12/22  1112     History  Chief Complaint  Patient presents with   ostomy bag change    Raymond Bowen is a 46 y.o. male.  Pt reports he just got out of jail.  Pt states he needs a colostomy bag.  Pt reports he has ordered bags but they will not arrive for 2 weeks.  No other complaints.    The history is provided by the patient. No language interpreter was used.       Home Medications Prior to Admission medications   Medication Sig Start Date End Date Taking? Authorizing Provider  DULoxetine (CYMBALTA) 60 MG capsule Take 1 capsule (60 mg total) by mouth daily. Patient not taking: Reported on 07/14/2021 03/29/21   Hoy Register, MD  furosemide (LASIX) 20 MG tablet TAKE 1 TABLET BY MOUTH ONCE DAILY NEEDED FOR LEG SWELLING. APPOINTMENT REQUIRED FOR FUTURE REFILLS Patient not taking: Reported on 07/14/2021 05/20/21   Hoy Register, MD  methocarbamol (ROBAXIN) 500 MG tablet Take 1 tablet (500 mg total) by mouth every 8 (eight) hours as needed for muscle spasms. Patient not taking: Reported on 07/14/2021 03/29/21   Hoy Register, MD  Misc. Devices MISC Colostomy bags. 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 Patient not taking: Reported on 09/16/2021 03/29/21   Hoy Register, MD  tamsulosin (FLOMAX) 0.4 MG CAPS capsule Take 1 capsule (0.4 mg total) by mouth daily. Patient not taking: Reported on 07/14/2021 04/12/21   Crista Elliot, MD      Allergies    Shellfish allergy, Biopatch protective disk-chg [chlorhexidine], and Vancomycin    Review of Systems   Review of Systems   All other systems reviewed and are negative.   Physical Exam Updated Vital Signs BP (!) 151/99   Pulse (!) 112   Temp 98.3 F (36.8 C) (Oral)   Resp 18   Ht 5\' 7"  (1.702 m)   Wt 77 kg   SpO2 100%   BMI 26.59 kg/m  Physical Exam Vitals and nursing note reviewed.  Constitutional:      Appearance: He is well-developed.  HENT:     Head: Normocephalic.     Mouth/Throat:     Mouth: Mucous membranes are moist.  Cardiovascular:     Rate and Rhythm: Normal rate.  Pulmonary:     Effort: Pulmonary effort is normal.  Abdominal:     General: There is no distension.  Musculoskeletal:        General: Normal range of motion.  Skin:    General: Skin is warm.  Neurological:     General: No focal deficit present.     Mental Status: He is alert and oriented to person, place, and time.  Psychiatric:        Mood and Affect: Mood normal.     ED Results / Procedures / Treatments   Labs (all labs ordered are listed, but only abnormal results are displayed) Labs Reviewed - No data to display  EKG None  Radiology No results found.  Procedures Procedures    Medications Ordered in ED Medications - No data to  display  ED Course/ Medical Decision Making/ A&P                           Medical Decision Making Pt has a history of colostomy.  Pt has a broken bag   Risk Risk Details: Pt has ordered supplies            Final Clinical Impression(s) / ED Diagnoses Final diagnoses:  Colostomy complication (HCC)    Rx / DC Orders ED Discharge Orders     None      An After Visit Summary was printed and given to the patient.    Osie Cheeks 06/12/22 1139    Rondel Baton, MD 06/13/22 [phone]

## 2022-06-14 ENCOUNTER — Other Ambulatory Visit: Payer: Self-pay

## 2022-06-14 ENCOUNTER — Emergency Department (HOSPITAL_COMMUNITY)
Admission: EM | Admit: 2022-06-14 | Discharge: 2022-06-14 | Disposition: A | Discharge status: Home / Self Care | Payer: Medicare Other | Attending: Emergency Medicine | Admitting: Emergency Medicine

## 2022-06-14 DIAGNOSIS — R1084 Generalized abdominal pain: Secondary | ICD-10-CM | POA: Insufficient documentation

## 2022-06-14 DIAGNOSIS — Z433 Encounter for attention to colostomy: Secondary | ICD-10-CM | POA: Diagnosis not present

## 2022-06-14 DIAGNOSIS — Z7901 Long term (current) use of anticoagulants: Secondary | ICD-10-CM | POA: Insufficient documentation

## 2022-06-14 DIAGNOSIS — N189 Chronic kidney disease, unspecified: Secondary | ICD-10-CM | POA: Diagnosis not present

## 2022-06-14 DIAGNOSIS — R000 Tachycardia, unspecified: Secondary | ICD-10-CM | POA: Diagnosis not present

## 2022-06-14 DIAGNOSIS — I959 Hypotension, unspecified: Secondary | ICD-10-CM | POA: Diagnosis not present

## 2022-06-14 DIAGNOSIS — T68XXXA Hypothermia, initial encounter: Secondary | ICD-10-CM | POA: Diagnosis not present

## 2022-06-14 LAB — COMPREHENSIVE METABOLIC PANEL
ALT: 28 U/L (ref 0–44)
AST: 36 U/L (ref 15–41)
Albumin: 3.6 g/dL (ref 3.5–5.0)
Alkaline Phosphatase: 98 U/L (ref 38–126)
Anion gap: 9 (ref 5–15)
BUN: 33 mg/dL — ABNORMAL HIGH (ref 6–20)
CO2: 20 mmol/L — ABNORMAL LOW (ref 22–32)
Calcium: 8.8 mg/dL — ABNORMAL LOW (ref 8.9–10.3)
Chloride: 110 mmol/L (ref 98–111)
Creatinine, Ser: 1.66 mg/dL — ABNORMAL HIGH (ref 0.61–1.24)
GFR, Estimated: 52 mL/min — ABNORMAL LOW (ref >60)
Glucose, Bld: 97 mg/dL (ref 70–99)
Potassium: 3.6 mmol/L (ref 3.5–5.1)
Sodium: 139 mmol/L (ref 135–145)
Total Bilirubin: 0.8 mg/dL (ref 0.3–1.2)
Total Protein: 7.4 g/dL (ref 6.5–8.1)

## 2022-06-14 LAB — CBC WITH DIFFERENTIAL/PLATELET
Abs Immature Granulocytes: 0.03 10*3/uL (ref 0.00–0.07)
Basophils Absolute: 0.1 10*3/uL (ref 0.0–0.1)
Basophils Relative: 0 %
Eosinophils Absolute: 0.5 10*3/uL (ref 0.0–0.5)
Eosinophils Relative: 3 %
HCT: 35.6 % — ABNORMAL LOW (ref 39.0–52.0)
Hemoglobin: 11.6 g/dL — ABNORMAL LOW (ref 13.0–17.0)
Immature Granulocytes: 0 %
Lymphocytes Relative: 21 %
Lymphs Abs: 3 10*3/uL (ref 0.7–4.0)
MCH: 30 pg (ref 26.0–34.0)
MCHC: 32.6 g/dL (ref 30.0–36.0)
MCV: 92 fL (ref 80.0–100.0)
Monocytes Absolute: 1.6 10*3/uL — ABNORMAL HIGH (ref 0.1–1.0)
Monocytes Relative: 12 %
Neutro Abs: 9 10*3/uL — ABNORMAL HIGH (ref 1.7–7.7)
Neutrophils Relative %: 64 %
Platelets: 216 10*3/uL (ref 150–400)
RBC: 3.87 MIL/uL — ABNORMAL LOW (ref 4.22–5.81)
RDW: 13 % (ref 11.5–15.5)
WBC: 14.1 10*3/uL — ABNORMAL HIGH (ref 4.0–10.5)
nRBC: 0 % (ref 0.0–0.2)

## 2022-06-14 LAB — LIPASE, BLOOD: Lipase: 37 U/L (ref 11–51)

## 2022-06-14 MED ORDER — OXYCODONE-ACETAMINOPHEN 5-325 MG PO TABS
1.0000 | ORAL_TABLET | Freq: Once | ORAL | Status: AC
  Administered 2022-06-14: 1 via ORAL
  Filled 2022-06-14: qty 1

## 2022-06-14 MED ORDER — NYSTATIN 100000 UNIT/GM EX POWD
Freq: Once | CUTANEOUS | Status: DC
  Filled 2022-06-14: qty 15

## 2022-06-14 NOTE — ED Triage Notes (Signed)
Pt arrives from [place] from hotel. Pt reports his colostomy had been leaking tonight for about 2-3 hours, then ruptured while he was sleeping, and has been having some increased pain and redness to the area for 3-4 days. 150/90, hr 110, 20rr, 100% ra, cbg 101
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
## 2022-06-14 NOTE — Discharge Instructions (Signed)
Please schedule an appointment with your PCP and arrange for colostomy care to be done outpatient.

## 2022-06-17 ENCOUNTER — Telehealth: Payer: Self-pay

## 2022-06-17 ENCOUNTER — Telehealth: Payer: Self-pay | Admitting: Family Medicine

## 2022-06-17 NOTE — Telephone Encounter (Signed)
I met with the patient when he was in the clinic this morning. He was in need of ostomy supplies and lost the phone number to contact [facility]. He said he was only given 1 ostomy bag when he was released from jail.    I called Estrellita Ludwig, FNP/ [facility] and explained patient's needs. She said she would leave ostomy bag/ supplies at th [facility] [place] [facility] for him to pick up after 1230 today.   I explained to the patient about picking up the supplies today. I also provided him with the phone number for [facility].  I told him that I would order the supplies for him now and going forward, he can call to order his supplies.  I confirmed his address and updated Epic. He said he has no working phone and there is no way to reach him, he will call this clinic with his phone number when he receives his phone on 06/21/2022   He spoke about his desire to have the ostomy reversed and I provided him with the phone numbers for the Surgery clinic [phone])  and GI clinic [phone]) that he had been working with in the past. He said he will call them to determine what he needs to do next to have the ostomy reversed.  I called [facility] [phone], spoke to [facility] and placed an order for new supplies for patient. She explained that they no longer carry [employer] supplies, they only carry [employer].  She stated that the products are the same sizes as the [employer] just a different brand.  I am not able to get in touch with the patient to explain this change and he needs the supplies so I placed the order and confirmed his home delivery address. Lonn Georgia said they should be shipped out today and he should receive the supplies in 1-2 business days.

## 2022-06-17 NOTE — Telephone Encounter (Signed)
Copied from CRM [id number]. Topic: General - Other >> Jun 17, 2022  4:04 PM Turkey B wrote: Reason for CRM: Kayla from [facility] called in asking for order to be recreatted for patient's colostomy supplies with phys signature.  [phone] ext [phone]  and [phone]

## 2022-06-17 NOTE — Telephone Encounter (Signed)
Copied from CRM [id number]. Topic: General - Other >> Jun 17, 2022  9:58 AM Turkey B wrote: Reason for CRM: Patient called in says is coming in theoffice to get assistance with colostomy bags. He doesn't have a phone to be reached back at and just got out of jail.

## 2022-06-17 NOTE — Telephone Encounter (Signed)
Noted.. I met with him when he came to the clinic

## 2022-06-20 ENCOUNTER — Other Ambulatory Visit (HOSPITAL_COMMUNITY): Payer: Self-pay | Admitting: Nurse Practitioner

## 2022-06-20 DIAGNOSIS — L24B3 Irritant contact dermatitis related to fecal or urinary stoma or fistula: Secondary | ICD-10-CM

## 2022-06-20 DIAGNOSIS — K9413 Enterostomy malfunction: Secondary | ICD-10-CM

## 2022-06-23 ENCOUNTER — Other Ambulatory Visit: Payer: Self-pay

## 2022-06-23 ENCOUNTER — Emergency Department (HOSPITAL_COMMUNITY)
Admission: EM | Admit: 2022-06-23 | Discharge: 2022-06-23 | Disposition: A | Discharge status: Home / Self Care | Payer: Medicare Other | Attending: Emergency Medicine | Admitting: Emergency Medicine

## 2022-06-23 DIAGNOSIS — Z433 Encounter for attention to colostomy: Secondary | ICD-10-CM | POA: Insufficient documentation

## 2022-06-23 DIAGNOSIS — Z760 Encounter for issue of repeat prescription: Secondary | ICD-10-CM | POA: Insufficient documentation

## 2022-06-23 DIAGNOSIS — R600 Localized edema: Secondary | ICD-10-CM | POA: Insufficient documentation

## 2022-06-23 DIAGNOSIS — R609 Edema, unspecified: Secondary | ICD-10-CM | POA: Diagnosis not present

## 2022-06-23 DIAGNOSIS — Z7189 Other specified counseling: Secondary | ICD-10-CM

## 2022-06-23 NOTE — ED Provider Notes (Signed)
[facility]-EMERGENC[facility] DEPT Provider Note   CSN: [id number] Arrival date & time: 06/23/22  1018     History No chief complaint on file.   Raymond Bowen is a 46 y.o. male.  Past medical history includes chronic pain, ileostomy.  He presents needing a colostomy bag change.  He says that he went to see his regular doctor who supplies his bags and did receive some, however he has run out and has not received the extra shipment.  He is having leakage due to it being full.  He denies any other problems such as pain, change in stool color or consistency, or nausea/vomiting  HPI     Home Medications Prior to Admission medications   Medication Sig Start Date End Date Taking? Authorizing Provider  DULoxetine (CYMBALTA) 60 MG capsule Take 1 capsule (60 mg total) by mouth daily. Patient not taking: Reported on 07/14/2021 03/29/21   Hoy Register, MD  furosemide (LASIX) 20 MG tablet TAKE 1 TABLET BY MOUTH ONCE DAILY NEEDED FOR LEG SWELLING. APPOINTMENT REQUIRED FOR FUTURE REFILLS Patient not taking: Reported on 07/14/2021 05/20/21   Hoy Register, MD  methocarbamol (ROBAXIN) 500 MG tablet Take 1 tablet (500 mg total) by mouth every 8 (eight) hours as needed for muscle spasms. Patient not taking: Reported on 07/14/2021 03/29/21   Hoy Register, MD  Misc. Devices MISC Colostomy bags. 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 Patient not taking: Reported on 09/16/2021 03/29/21   Hoy Register, MD  tamsulosin (FLOMAX) 0.4 MG CAPS capsule Take 1 capsule (0.4 mg total) by mouth daily. Patient not taking: Reported on 07/14/2021 04/12/21   Crista Elliot, MD      Allergies     Shellfish allergy, Biopatch protective disk-chg [chlorhexidine], and Vancomycin    Review of Systems   Review of Systems  All other systems reviewed and are negative.   Physical Exam Updated Vital Signs BP 127/86   Pulse 100   Temp 98.4 F (36.9 C) (Oral)   Resp 18   SpO2 98%  Physical Exam Vitals and nursing note reviewed.  Constitutional:      General: He is not in acute distress.    Appearance: Normal appearance. He is well-developed. He is not ill-appearing, toxic-appearing or diaphoretic.  HENT:     Head: Normocephalic and atraumatic.     Nose: No nasal deformity.     Mouth/Throat:     Lips: Pink. No lesions.  Eyes:     General: Gaze aligned appropriately. No scleral icterus.       Right eye: No discharge.        Left eye: No discharge.     Conjunctiva/sclera: Conjunctivae normal.     Right eye: Right conjunctiva is not injected. No exudate or hemorrhage.    Left eye: Left conjunctiva is not injected. No exudate or hemorrhage. Pulmonary:     Effort: Pulmonary effort is normal. No respiratory distress.  Abdominal:     Comments: Ostomy bag in place. Leakage noted. Redness and raw skin around site.   Skin:    General: Skin is warm and dry.  Neurological:     Mental Status: He is alert and oriented  to person, place, and time.  Psychiatric:        Mood and Affect: Mood normal.        Speech: Speech normal.        Behavior: Behavior normal. Behavior is cooperative.     ED Results / Procedures / Treatments   Labs (all labs ordered are listed, but only abnormal results are displayed) Labs Reviewed - No data to display  EKG None  Radiology No results found.  Procedures Procedures   Medications Ordered in ED Medications - No data to display  ED Course/ Medical Decision Making/ A&P                           Medical Decision Making  Patient is seen here for leaking of his colostomy bag.  This is a common occurrence and he regularly seen in the ED for  bag changes.  There is currently a problem with getting them sent to him in the mail.  He is seeing his wound care regarding the dermatitis surrounding the ostomy.  He has no other complaints today.  We have changed his here provided discharge instructions.  Told to follow-up with wound care.   Final Clinical Impression(s) / ED Diagnoses Final diagnoses:  Encounter for ostomy care education    Rx / DC Orders ED Discharge Orders     None         Therese Sarah 06/23/22 1310    Lorre Nick, MD 06/25/22 1336

## 2022-06-23 NOTE — ED Triage Notes (Signed)
Pt BIB [facility] from [employer] parking lot. Reports R lower leg swelling. He was seen at 1130a today.

## 2022-06-23 NOTE — ED Triage Notes (Signed)
Patient reports he needs some colostomy bag supplies, his current bag is leaking. No other complaints or issues.

## 2022-06-24 ENCOUNTER — Telehealth: Payer: Self-pay

## 2022-06-24 ENCOUNTER — Other Ambulatory Visit (HOSPITAL_COMMUNITY): Payer: Self-pay

## 2022-06-24 ENCOUNTER — Emergency Department (HOSPITAL_COMMUNITY)
Admission: EM | Admit: 2022-06-24 | Discharge: 2022-06-24 | Disposition: A | Discharge status: Home / Self Care | Payer: Medicare Other | Source: Home / Self Care | Attending: Emergency Medicine | Admitting: Emergency Medicine

## 2022-06-24 DIAGNOSIS — Z760 Encounter for issue of repeat prescription: Secondary | ICD-10-CM

## 2022-06-24 MED ORDER — FUROSEMIDE 20 MG PO TABS
20.0000 mg | ORAL_TABLET | Freq: Every day | ORAL | 0 refills | Status: DC
  Filled 2022-06-24: qty 14, 14d supply, fill #0

## 2022-06-24 MED ORDER — RIVAROXABAN 20 MG PO TABS
20.0000 mg | ORAL_TABLET | Freq: Every day | ORAL | 0 refills | Status: DC
  Filled 2022-06-24: qty 14, 14d supply, fill #0

## 2022-06-24 NOTE — Discharge Instructions (Signed)
Return for any problem.  ?

## 2022-06-24 NOTE — Telephone Encounter (Signed)
I spoke to Kayla/ [facility] [phone] and she confirmed that she received the orders for the ostomy supplies on 06/20/2022 and they are being shipped out today.  The patient should receive them in 1-2 business days.

## 2022-06-24 NOTE — Progress Notes (Signed)
Per Camellia, RN CM, pt's supplies were ordered and should arrive within a day. Pt is aware of how to get supplies once they've arrived. EDP Dr. Rodena Medin notified via secure chat.

## 2022-06-24 NOTE — ED Provider Notes (Signed)
[facility]-EMERGENC[facility] DEPT Provider Note   CSN: [id number] Arrival date & time: 06/23/22  2350     History  Chief Complaint  Patient presents with   Leg Pain    Raymond Bowen is a 46 y.o. male.  46 year old male with prior medical history as detailed below presents for evaluation.  Patient is well-known to the [facility] health system with multiple frequent ED visits.  Patient was seen yesterday in the ED at [facility] asking for a new colostomy bag.  He has longstanding issues with obtaining appropriate supplies for his colostomy.  Apparently after his visit yesterday he left the facility for several hours and then returned in the evening.  He has been in the ED waiting room overnight waiting for evaluation.  This morning on evaluation he is requesting that he receive medications.  Patient reports that he was released from jail approximately 1 week ago.  He was not given any prescriptions per his report.  He cannot recall exactly what medications he was on while incarcerated.  He is nearly 100% positive that he was taking Lasix 20 mg daily and Eliquis or Xarelto for his chronic right leg DVT.  He is unsure of other medications that he may have been taking while incarcerated.  He is otherwise without acute complaint.  He has not yet followed up with Dr. [name] - whom he has seen previously for routine primary care.    The history is provided by the patient and medical records.       Home Medications Prior to Admission medications   Medication Sig Start Date End Date Taking? Authorizing Provider  DULoxetine (CYMBALTA) 60 MG capsule Take 1 capsule (60 mg total) by mouth daily. Patient not taking: Reported on 07/14/2021 03/29/21   Hoy Register, MD  furosemide (LASIX) 20 MG tablet TAKE 1 TABLET BY MOUTH ONCE DAILY NEEDED FOR LEG SWELLING. APPOINTMENT REQUIRED FOR FUTURE REFILLS Patient not taking: Reported on 07/14/2021 05/20/21   Hoy Register, MD  methocarbamol  (ROBAXIN) 500 MG tablet Take 1 tablet (500 mg total) by mouth every 8 (eight) hours as needed for muscle spasms. Patient not taking: Reported on 07/14/2021 03/29/21   Hoy Register, MD  Misc. Devices MISC Colostomy bags. 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 Patient not taking: Reported on 09/16/2021 03/29/21   Hoy Register, MD  tamsulosin (FLOMAX) 0.4 MG CAPS capsule Take 1 capsule (0.4 mg total) by mouth daily. Patient not taking: Reported on 07/14/2021 04/12/21   Crista Elliot, MD      Allergies    Shellfish allergy, Biopatch protective disk-chg [chlorhexidine], and Vancomycin    Review of Systems   Review of Systems  All other systems reviewed and are negative.   Physical Exam Updated Vital Signs BP (!) 142/71   Pulse 89   Temp 98.1 F (36.7 C) (Oral)   Resp 18   Ht 5\' 7"  (1.702 m)   Wt 77.1 kg   SpO2 100%   BMI 26.63 kg/m  Physical Exam Vitals and nursing note reviewed.  Constitutional:      General: He is not in acute distress.    Appearance: Normal appearance. He is well-developed.  HENT:     Head: Normocephalic and atraumatic.  Eyes:     Conjunctiva/sclera: Conjunctivae normal.     Pupils: Pupils are equal, round, and reactive to light.  Cardiovascular:     Rate and Rhythm: Normal rate and regular rhythm.     Heart sounds: Normal heart sounds.  Pulmonary:     Effort: Pulmonary effort is normal. No respiratory distress.     Breath sounds: Normal breath sounds.  Abdominal:     General: There is no distension.     Palpations: Abdomen is soft.     Tenderness: There is no abdominal tenderness.  Musculoskeletal:        General: No deformity. Normal range  of motion.     Cervical back: Normal range of motion and neck supple.     Right lower leg: Edema present.     Comments: Chronic edema of right lower extremity  Skin:    General: Skin is warm and dry.  Neurological:     General: No focal deficit present.     Mental Status: He is alert and oriented to person, place, and time.     ED Results / Procedures / Treatments   Labs (all labs ordered are listed, but only abnormal results are displayed) Labs Reviewed - No data to display  EKG None  Radiology No results found.  Procedures Procedures    Medications Ordered in ED Medications - No data to display  ED Course/ Medical Decision Making/ A&P                           Medical Decision Making   Medical Screen Complete  This patient presented to the ED with complaint of medication refill.  This complaint involves an extensive number of treatment options. The initial differential diagnosis includes, but is not limited to, medication refill  This presentation is: Acute, Self-Limited, Previously Undiagnosed, and Uncertain Prognosis  Patient is reporting recent incarceration and release approximately 1 week ago.  Patient is requesting refills on medications that he received while incarcerated.  He is unsure of exactly what medications is taking.  Patient with chronic right lower extremity edema secondary to chronic DVT.  Patient can recall using Lasix and Xarelto while incarcerated.  He is unable to recall other medications that may or may not of been prescribed/administered.  Patient is strongly encouraged to follow-up with his outpatient care providers.  Patient is agreeable with plan to receive a short prescription for both Lasix and Xarelto.  Importance of close follow-up is repeatedly stressed.  Additional history obtained:  External records from outside sources obtained and reviewed including prior ED visits and prior Inpatient records.  Problem List / ED  Course:  Medication refill   Reevaluation:  After the interventions noted above, I reevaluated the patient and found that they have: stayed the same   Disposition:  After consideration of the diagnostic results and the patients response to treatment, I feel that the patent would benefit from close outpatient followup.          Final Clinical Impression(s) / ED Diagnoses Final diagnoses:  Medication refill    Rx / DC Orders ED Discharge Orders          Ordered    furosemide (LASIX) 20 MG tablet  Daily        06/24/22 0948    rivaroxaban (XARELTO) 20 MG TABS tablet  Daily with supper  06/24/22 6203              Wynetta Fines, MD 06/24/22 [phone]

## 2022-06-25 ENCOUNTER — Other Ambulatory Visit (HOSPITAL_COMMUNITY): Payer: Self-pay | Admitting: Nurse Practitioner

## 2022-06-25 DIAGNOSIS — L24B3 Irritant contact dermatitis related to fecal or urinary stoma or fistula: Secondary | ICD-10-CM

## 2022-06-25 DIAGNOSIS — K9413 Enterostomy malfunction: Secondary | ICD-10-CM

## 2022-06-26 ENCOUNTER — Telehealth: Payer: Self-pay | Admitting: Licensed Clinical Social Worker

## 2022-06-26 NOTE — Patient Outreach (Signed)
  Care Coordination   06/26/2022 Name: SAEL FURCHES MRN: [id number] DOB: 1976/06/05   Care Coordination Outreach Attempts:  An unsuccessful telephone outreach was attempted today to offer the patient information about available care coordination services as a benefit of their health plan.   Follow Up Plan:  Additional outreach attempts will be made to offer the patient care coordination information and services.   Encounter Outcome:  No Answer   Care Coordination Interventions:  No, not indicated    Jenel Lucks, MSW, LCSW [facility] Care Management [place]  [facility] [email or web address] Phone [phone] 4:27 PM

## 2022-06-28 ENCOUNTER — Telehealth: Payer: Self-pay | Admitting: Licensed Clinical Social Worker

## 2022-06-28 ENCOUNTER — Telehealth: Payer: Self-pay
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
## 2022-06-28 NOTE — Telephone Encounter (Signed)
I was informed by Jenel Lucks, LCSW that the patient was in need of ostomy supplies.  He stated he never received the delivery yesterday. Jasmine contacted [facility] and they confirmed that the supplies were delivered to [address], [place] yesterday ( Thursday) but the patient said they were not there when he checked.   I spoke to the patient with [facility] and he again stated that the supplies were not at the address that [facility] stated they were delivered to. I explained to him that at this time, after 1700 on Friday- 12/8, if he needs supplies he will need to go to the ED. I told him that I have spoken with Judeth Cornfield, CM and she said that he will need to check into the ED, they are not permitted to just give him supplies.  The patient requested that the supplies be delivered to [facility], then he will be certain they arrive and he can come to pick them up. I told him that I would contact [facility] and make that request for re-delivery.  I spoke to [facility] and she stated that they will re-ship patient's ostomy order. It was after their shipping  deadline for Friday and they would not be able to ship out until Mon, 12/11. She also spoke to her supervisor, Raezl, who said that they need to obtain a written authorization from me in order to ship the supplies to the clinic and not the patient's residence. . She said they cannot accept a verbal authorization.  She agreed to have Raezl email me the document that needs signature.

## 2022-06-28 NOTE — Telephone Encounter (Addendum)
Pt is calling and stated he would like to speak with Nurse Erskine Squibb; however, she is not available and would like to speak with someone about getting his ostomy supplies.    Callback- [phone]  Please advise.

## 2022-06-28 NOTE — Telephone Encounter (Signed)
Routing to case manager 

## 2022-06-28 NOTE — Telephone Encounter (Signed)
        Patient  visited [facility] on 12/4     Telephone encounter attempt :  1st  A HIPAA compliant voice message was left requesting a return call.  Instructed patient to call back     Teyah Rossy [facility] Care Guide, [place], Care Management  [phone] [address], [place], [place] [address] Phone: [phone] Email: [email or web address]       

## 2022-06-29 NOTE — Telephone Encounter (Signed)
I received the document from Raezl/[facility]. I completed it and returned to [employer] via [employer]

## 2022-06-29 NOTE — Telephone Encounter (Signed)
I have spoken to the patient and this is documented in another encounter

## 2022-07-01 ENCOUNTER — Telehealth: Payer: Self-pay | Admitting: General Practice

## 2022-07-01 NOTE — Telephone Encounter (Signed)
I spoke to [employer] and she said that patient's supplies were shipped today and should be received by Wed. 07/03/2022

## 2022-07-01 NOTE — Telephone Encounter (Signed)
Patient came to the clinic.  I reminded him that we do not have the ostomy bags he needs. He said he is sure that they were not delivered to his home address.  I explained to him that I requested the order be re-sent on Friday, 12/8 and was informed that the delivery would not go out until today.  I needed to sign an authorization for the supplies to be shipped to [facility].     I also explained that I spoke to Angela/ [facility] and was informed that Mike Gip, FNP is not in the clinic today and would not be able to leave him supplies today.  He said he understood. I instructed him to call me in the morning and I will let him know if [name] is able to leave him supplies tomorrow.

## 2022-07-01 NOTE — Telephone Encounter (Signed)
Copied from CRM [id number]. Topic: General - Other >> Jul 01, 2022 11:42 AM Turkey B wrote: Reason for CRM: Patient called in about not receiving ostomy supplies. Please call back

## 2022-07-02 ENCOUNTER — Emergency Department (HOSPITAL_COMMUNITY)
Admission: EM | Admit: 2022-07-02 | Discharge: 2022-07-03 | Disposition: A | Discharge status: Home / Self Care | Payer: Medicare Other | Attending: Emergency Medicine | Admitting: Emergency Medicine

## 2022-07-02 DIAGNOSIS — K9409 Other complications of colostomy: Secondary | ICD-10-CM | POA: Diagnosis not present

## 2022-07-02 DIAGNOSIS — R000 Tachycardia, unspecified: Secondary | ICD-10-CM | POA: Diagnosis not present

## 2022-07-02 DIAGNOSIS — Z7901 Long term (current) use of anticoagulants: Secondary | ICD-10-CM | POA: Diagnosis not present

## 2022-07-02 DIAGNOSIS — Z433 Encounter for attention to colostomy: Secondary | ICD-10-CM | POA: Insufficient documentation

## 2022-07-02 NOTE — Telephone Encounter (Signed)
Message received that patient was requesting a call back.    I spoke to Angela/ [facility] this morning and informed her that the patient picked up the supplies that [name] left him last week however he is still in need of supplies.  The delivery from [facility] is expected by tomorrow.  Marylene Land said she will check with [name] and call the patient.    I returned the call to the patient and he said the ostomy clinic just called him and they will have supplies for him today.  He was very appreciative of the assistance.

## 2022-07-03 ENCOUNTER — Encounter (HOSPITAL_COMMUNITY): Payer: Self-pay

## 2022-07-03 ENCOUNTER — Other Ambulatory Visit: Payer: Self-pay

## 2022-07-03 NOTE — ED Triage Notes (Signed)
Pt states that he just needs colostomy supplies.

## 2022-07-03 NOTE — ED Provider Notes (Signed)
[facility]-EMERGENCY DEPT Provider Note   CSN: [id number] Arrival date & time: 07/02/22  2345     History  Chief Complaint  Patient presents with   colostomy supplies    LANE ELAND is a 46 y.o. male.  The history is provided by the patient.  KINCAID TIGER is a 46 y.o. male who presents to the Emergency Department complaining of colostomy supplies.  He presents the emergency department requesting colostomy supplies.  He states that he was supposed to pick up the supplies today but did not get out of work early enough to pick them up before the office closed.  He has no acute medical complaints.  Denies any fevers, chest pain, abdominal pain, vomiting.     Home Medications Prior to Admission medications   Medication Sig Start Date End Date Taking? Authorizing Provider  DULoxetine (CYMBALTA) 60 MG capsule Take 1 capsule (60 mg total) by mouth daily. Patient not taking: Reported on 07/14/2021 03/29/21   Hoy Register, MD  furosemide (LASIX) 20 MG tablet TAKE 1 TABLET BY MOUTH ONCE DAILY NEEDED FOR LEG SWELLING. APPOINTMENT REQUIRED FOR FUTURE REFILLS Patient not taking: Reported on 07/14/2021 05/20/21   Hoy Register, MD  furosemide (LASIX) 20 MG tablet Take 1 tablet (20 mg total) by mouth daily. 06/24/22   Wynetta Fines, MD  methocarbamol (ROBAXIN) 500 MG tablet Take 1 tablet (500 mg total) by mouth every 8 (eight) hours as needed for muscle spasms. Patient not taking: Reported on 07/14/2021 03/29/21   Hoy Register, MD  Misc. Devices MISC Colostomy bags. 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 Patient not taking: Reported on 09/16/2021 03/29/21   Hoy Register, MD  rivaroxaban (XARELTO) 20 MG TABS tablet Take 1 tablet (20 mg total) by mouth daily with supper for 14 days. 06/24/22 07/08/22  Wynetta Fines, MD  tamsulosin (FLOMAX) 0.4 MG CAPS capsule Take 1 capsule (0.4 mg total) by mouth daily. Patient not taking: Reported on 07/14/2021 04/12/21   Crista Elliot, MD      Allergies    Shellfish allergy, Biopatch protective disk-chg [chlorhexidine], and Vancomycin    Review of Systems   Review of Systems  All other systems reviewed and are negative.   Physical Exam Updated Vital Signs BP 121/75 (BP Location: Right Arm)   Pulse (!) 110   Temp 97.9 F (36.6 C) (Oral)   Resp 18   Ht 5\' 7"  (1.702 m)   Wt 77.1 kg   SpO2 100%   BMI 26.62 kg/m  Physical Exam Vitals and nursing note reviewed.  Constitutional:      Appearance: He is well-developed.  HENT:     Head: Normocephalic and atraumatic.  Cardiovascular:     Rate and Rhythm: Regular rhythm. Tachycardia present.  Pulmonary:     Effort: Pulmonary effort is normal. No respiratory distress.  Abdominal:     Palpations: Abdomen is soft.     Tenderness: There is no abdominal tenderness. There is no guarding or rebound.     Comments: There is a large midline abdominal hernia that is soft.  There is a colostomy in the right lower quadrant with bag and dressing  in place.  Skin:    General: Skin is warm and dry.  Neurological:     Mental Status: He is alert and oriented to person, place, and time.  Psychiatric:        Behavior: Behavior normal.     ED Results / Procedures / Treatments   Labs (all labs ordered are listed, but only abnormal results are displayed) Labs Reviewed - No data to display  EKG None  Radiology No results found.  Procedures Procedures    Medications Ordered in ED Medications - No data to display  ED Course/ Medical Decision Making/ A&P                           Medical Decision Making  Patient here for request of colostomy supplies.   He has no systemic complaints.  He is awake, alert and well-perfused with no abdominal tenderness.  Supplies provided by nursing and patient changed his colostomy himself.  Plan to discharge.  He does have plan to obtain more supplies when the office is open.        Final Clinical Impression(s) / ED Diagnoses Final diagnoses:  Colostomy care [facility])    Rx / DC Orders ED Discharge Orders     None         Tilden Fossa, MD 07/03/22 0030

## 2022-07-04 ENCOUNTER — Telehealth: Payer: Self-pay

## 2022-07-04 NOTE — Telephone Encounter (Signed)
I was informed today that the supplies were delivered to [facility] but the registrar did not recognize the patient's name on the box and would not accept the delivery.   I called [facility], spoke to [facility] and explained what happened with with delivery.  She said she will contact logistics to determine where the shipment is and have it re-shipped but will send to my attention.  She said that the order may be delivered as early as tomorrow but it may not be until Monday, 07/08/2022.

## 2022-07-08 NOTE — Telephone Encounter (Signed)
I spoke to [employer] to check on the status of the order.  She contacted [employer] while I was on the phone and [employer] told her that they needed the name of the clinic with the address.  They had [address].  Judeth Cornfield provided them with the information they requested and said that the order should be delivered tomorrow or Wednesday.   I told her that the supplies were delivered to the correct clinic last week and the patient's name was on the order.  The problem was that the [facility] registrar did not accept the order. I explained that the patient desperately needs these supplies.

## 2022-07-08 NOTE — Telephone Encounter (Signed)
FYI

## 2022-07-08 NOTE — Telephone Encounter (Signed)
Pt called asking if the ostomy supplies have came in/.  He would ike you to call him back

## 2022-07-08 NOTE — Telephone Encounter (Signed)
I spoke to him and informed him that they have not arrived yet.

## 2022-07-09 ENCOUNTER — Emergency Department (HOSPITAL_COMMUNITY)
Admission: EM | Admit: 2022-07-09 | Discharge: 2022-07-09 | Disposition: A | Discharge status: Home / Self Care | Payer: Medicare Other | Attending: Emergency Medicine | Admitting: Emergency Medicine

## 2022-07-09 ENCOUNTER — Other Ambulatory Visit: Payer: Self-pay

## 2022-07-09 DIAGNOSIS — Z939 Artificial opening status, unspecified: Secondary | ICD-10-CM

## 2022-07-09 DIAGNOSIS — Z932 Ileostomy status: Secondary | ICD-10-CM | POA: Diagnosis not present

## 2022-07-09 DIAGNOSIS — Z7901 Long term (current) use of anticoagulants: Secondary | ICD-10-CM | POA: Diagnosis not present

## 2022-07-09 DIAGNOSIS — N189 Chronic kidney disease, unspecified: Secondary | ICD-10-CM | POA: Diagnosis not present

## 2022-07-09 DIAGNOSIS — Z760 Encounter for issue of repeat prescription: Secondary | ICD-10-CM | POA: Insufficient documentation

## 2022-07-09 NOTE — Telephone Encounter (Signed)
FYI

## 2022-07-09 NOTE — ED Notes (Signed)
2 sets of ostomy bags provided for pt.

## 2022-07-09 NOTE — ED Provider Notes (Signed)
[facility]-EMER[facility] DEPT Provider Note   CSN: [id number] Arrival date & time: 07/09/22  1322     History  No chief complaint on file.   Raymond Bowen is a 46 y.o. male with history of chronic kidney disease, chronic DVT, adjustment disorder, neuropathy, ileostomy in place, polysubstance abuse who presents to the emergency department requesting ostomy supplies. States that he tried going to [facility] for his refill on Friday (4 days ago) and yesterday, and they did not have his materials. He believes they should get in today but he's having a hard time eating without adequate supplies. Every time he eats he endorses abdominal burning. No fever, nausea, vomiting.   HPI     Home Medications Prior to Admission medications   Medication Sig Start Date End Date Taking? Authorizing Provider  DULoxetine (CYMBALTA) 60 MG capsule Take 1 capsule (60 mg total) by mouth daily. Patient not taking: Reported on 07/14/2021 03/29/21   Hoy Register, MD  furosemide (LASIX) 20 MG tablet TAKE 1 TABLET BY MOUTH ONCE DAILY NEEDED FOR LEG SWELLING. APPOINTMENT REQUIRED FOR FUTURE REFILLS Patient not taking: Reported on 07/14/2021 05/20/21   Hoy Register, MD  furosemide (LASIX) 20 MG tablet Take 1 tablet (20 mg total) by mouth daily. 06/24/22   Wynetta Fines, MD  methocarbamol (ROBAXIN) 500 MG tablet Take 1 tablet (500 mg total) by mouth every 8 (eight) hours as needed for muscle spasms. Patient not taking: Reported on 07/14/2021 03/29/21   Hoy Register, MD  Misc. Devices MISC Colostomy bags. 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 Patient not taking:  Reported on 09/16/2021 03/29/21   Hoy Register, MD  rivaroxaban (XARELTO) 20 MG TABS tablet Take 1 tablet (20 mg total) by mouth daily with supper for 14 days. 06/24/22 07/08/22  Wynetta Fines, MD  tamsulosin (FLOMAX) 0.4 MG CAPS capsule Take 1 capsule (0.4 mg total) by mouth daily. Patient not taking: Reported on 07/14/2021 04/12/21   Crista Elliot, MD      Allergies    Shellfish allergy, Biopatch protective disk-chg [chlorhexidine], and Vancomycin    Review of Systems   Review of Systems  Constitutional:  Negative for chills and fever.  Gastrointestinal:  Negative for abdominal pain, constipation, diarrhea, nausea and vomiting.       Abdominal "burning"  All other systems reviewed and are negative.   Physical Exam Updated Vital Signs BP (!) 141/80 (BP Location: Left Arm)   Pulse (!) 106   Temp 98.3 F (36.8 C) (Oral)   Resp 18   SpO2 96%  Physical Exam Vitals and nursing note reviewed.  Constitutional:      Appearance: Normal appearance.  HENT:     Head: Normocephalic and atraumatic.  Eyes:     Conjunctiva/sclera: Conjunctivae normal.  Pulmonary:     Effort: Pulmonary effort is normal. No respiratory distress.  Abdominal:     Comments: Ostomy site without erythema, edema or purulence  Skin:    General: Skin is warm and dry.  Neurological:     Mental Status: He is alert.  Psychiatric:        Mood and Affect: Mood normal.  Behavior: Behavior normal.     ED Results / Procedures / Treatments   Labs (all labs ordered are listed, but only abnormal results are displayed) Labs Reviewed - No data to display  EKG None  Radiology No results found.  Procedures Procedures    Medications Ordered in ED Medications - No data to display  ED Course/ Medical Decision Making/ A&P                           Medical Decision Making  Patient is a 46 year old male with history of chronic kidney disease, chronic DVT, adjustment disorder, neuropathy, ileostomy  in place, polysubstance abuse who presents to the emergency department requesting ostomy supplies.   Complaining of abdominal burning with eating. No fever, chills, abdominal pain, nausea, vomiting or diarrhea.  Ostomy site evaluated. No evidence of infection.   ED care plan reviewed with numerous ER visits for ostomy supplies after being encouraged to follow up with PCP.   Patient encouraged to wash out his current waffle bag and reuse it as our department is out of the size he needs.   Patient does not demonstrate emergent condition requiring further evaluation or treatment. Patient discharged in stable condition.   Final Clinical Impression(s) / ED Diagnoses Final diagnoses:  Encounter for medication refill  History of creation of ostomy [facility])    Rx / DC Orders ED Discharge Orders     None      Portions of this report may have been transcribed using voice recognition software. Every effort was made to ensure accuracy; however, inadvertent computerized transcription errors may be present.    Jeanella Flattery 07/09/22 1615    Derwood Kaplan, MD 07/10/22 [phone]

## 2022-07-09 NOTE — Telephone Encounter (Signed)
I called [facility] [phone] to inquire if there is an update on the status of the supply order  I spoke to [facility] who stated that they have no update on when the order will be delivered to [facility].

## 2022-07-09 NOTE — Telephone Encounter (Signed)
Pt has called back again re supplies [phone].

## 2022-07-09 NOTE — Telephone Encounter (Signed)
Patient called back again, about colostomy supplies, says ostomy bag end is paining and needs supplies right away

## 2022-07-09 NOTE — Telephone Encounter (Signed)
I spoke to patient when he was in the ED and he said he said they are getting him some ostomy bags.  When I asked him if he has called the surgeon yet, he said he called multiple times today and no one has called him back yet and he wants to get this ostomy reversed.    I told him that I will call him as soon as the supplies are delivered from [facility].

## 2022-07-09 NOTE — Telephone Encounter (Signed)
Pt is requesting a call back from nurse Robyne Peers.  Please advise.

## 2022-07-09 NOTE — Telephone Encounter (Signed)
Pt requesting a cb from Erskine Squibb regarding ostomy supplies  Pt states he is currently at the hospital and there is a 17 hour wait  Please fu w/ pt

## 2022-07-09 NOTE — Telephone Encounter (Signed)
I spoke to patient twice and explained to him what has happened with the ostomy supplies. They still have not been delivered to this clinic.Marland Kitchen  He stated that he desperately needs ostomy bags and requested I contact the ostomy clinic.    I left a message on voicemail for [facility] requesting a call back and I also paged Mike Gip, FNP.

## 2022-07-09 NOTE — Discharge Instructions (Addendum)
Follow up with [facility] for refills of your ostomy supplies

## 2022-07-10 ENCOUNTER — Telehealth: Payer: Self-pay

## 2022-07-10 NOTE — Telephone Encounter (Signed)
I called the patient and informed him that his ostomy supply delivery has arrived at [facility].   If he needs supplies before the holiday, they will need to be picked up by 1200 on 07/12/2022.  The clinic is only open 1/2 day on 07/12/2022. He said he understood and would be here tomorrow to pick them up.    I also spoke to Angela/ [facility] and informed her that the patient's supplies were delivered today.

## 2022-07-13 ENCOUNTER — Other Ambulatory Visit: Payer: Self-pay

## 2022-07-13 ENCOUNTER — Emergency Department (HOSPITAL_COMMUNITY)
Admission: EM | Admit: 2022-07-13 | Discharge: 2022-07-13 | Disposition: A | Discharge status: Home / Self Care | Payer: Medicare Other | Attending: Emergency Medicine | Admitting: Emergency Medicine

## 2022-07-13 DIAGNOSIS — R609 Edema, unspecified: Secondary | ICD-10-CM | POA: Diagnosis not present

## 2022-07-13 DIAGNOSIS — Z933 Colostomy status: Secondary | ICD-10-CM | POA: Insufficient documentation

## 2022-07-13 DIAGNOSIS — Z7901 Long term (current) use of anticoagulants: Secondary | ICD-10-CM | POA: Insufficient documentation

## 2022-07-13 DIAGNOSIS — Z433 Encounter for attention to colostomy: Secondary | ICD-10-CM | POA: Diagnosis not present

## 2022-07-13 DIAGNOSIS — I82411 Acute embolism and thrombosis of right femoral vein: Secondary | ICD-10-CM | POA: Diagnosis not present

## 2022-07-13 DIAGNOSIS — R6889 Other general symptoms and signs: Secondary | ICD-10-CM | POA: Diagnosis not present

## 2022-07-13 DIAGNOSIS — K9409 Other complications of colostomy: Secondary | ICD-10-CM | POA: Diagnosis not present

## 2022-07-13 NOTE — Discharge Instructions (Addendum)
Follow-up with the wellness clinic to get your supplies.

## 2022-07-13 NOTE — ED Notes (Signed)
Patient provided with ostomy supplies and sandwich.  Assisted to waiting room to call for ride home.  No other complaints voiced at this time

## 2022-07-13 NOTE — ED Provider Notes (Signed)
[facility]-EMERGENCY DEPT Provider Note   CSN: [id number] Arrival date & time: 07/13/22  0406     History  Chief Complaint  Patient presents with   Ostomy Problem    Raymond Bowen is a 46 y.o. male.  The history is provided by the patient and medical records.   45 y.o. M here requesting ostomy supplies.  Bags were sent to [facility] yesterday, however they were only open half a day and he did not have any transportation there to pick them up.  Denies fever, vomiting.  Still having normal output from his ostomy.  Home Medications Prior to Admission medications   Medication Sig Start Date End Date Taking? Authorizing Provider  DULoxetine (CYMBALTA) 60 MG capsule Take 1 capsule (60 mg total) by mouth daily. Patient not taking: Reported on 07/14/2021 03/29/21   Hoy Register, MD  furosemide (LASIX) 20 MG tablet TAKE 1 TABLET BY MOUTH ONCE DAILY NEEDED FOR LEG SWELLING. APPOINTMENT REQUIRED FOR FUTURE REFILLS Patient not taking: Reported on 07/14/2021 05/20/21   Hoy Register, MD  furosemide (LASIX) 20 MG tablet Take 1 tablet (20 mg total) by mouth daily. 06/24/22   Wynetta Fines, MD  methocarbamol (ROBAXIN) 500 MG tablet Take 1 tablet (500 mg total) by mouth every 8 (eight) hours as needed for muscle spasms. Patient not taking: Reported on 07/14/2021 03/29/21   Hoy Register, MD  Misc. Devices MISC Colostomy bags. 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 Patient not taking: Reported on 09/16/2021 03/29/21   Hoy Register, MD  rivaroxaban (XARELTO) 20 MG TABS tablet Take 1 tablet (20 mg total) by mouth daily with supper for 14 days. 06/24/22 07/08/22  Wynetta Fines, MD  tamsulosin (FLOMAX) 0.4 MG CAPS capsule Take 1 capsule (0.4 mg total) by mouth daily. Patient not taking: Reported on 07/14/2021 04/12/21   Crista Elliot, MD      Allergies    Shellfish allergy, Biopatch protective disk-chg [chlorhexidine], and Vancomycin    Review of Systems   Review of Systems  Constitutional:        Ostomy supplies  All other systems reviewed and are negative.   Physical Exam Updated Vital Signs BP (!) 151/91 (BP Location: Left Arm)   Pulse 95   Temp 98.8 F (37.1 C) (Oral)   Resp 16   SpO2 100%   Physical Exam Vitals and nursing note reviewed.  Constitutional:      Appearance: He is well-developed.  HENT:     Head: Normocephalic and atraumatic.  Eyes:     Conjunctiva/sclera: Conjunctivae normal.     Pupils: Pupils are equal, round, and reactive to light.  Cardiovascular:     Rate and Rhythm: Normal rate and regular rhythm.     Heart sounds: Normal heart sounds.  Pulmonary:     Effort: Pulmonary effort is normal.     Breath sounds: Normal breath sounds.  Abdominal:     General: Bowel sounds are normal.     Palpations: Abdomen is soft.     Comments: Multiple well healed surgical incisions, ostomy right abdomen with yellow/green fecal output, no bag in place, stoma appears overall clean  Musculoskeletal:  General: Normal range of motion.     Cervical back: Normal range of motion.  Skin:    General: Skin is warm and dry.  Neurological:     Mental Status: He is alert and oriented to person, place, and time.     ED Results / Procedures / Treatments   Labs (all labs ordered are listed, but only abnormal results are displayed) Labs Reviewed - No data to display  EKG None  Radiology No results found.  Procedures Procedures    Medications Ordered in ED Medications - No data to display  ED Course/ Medical Decision Making/ A&P                           Medical Decision Making  46 year old male presenting to the ED  requesting ostomy supplies.  He has been at the clinic, however unable to make it there today as he did not have transportation.  He appears at baseline.  Still having output from his ostomy but no bag in place.  He was given soap/towels to wash up as well as ostomy bags x2.  Stable for discharge.  Follow-up with PCP.  Return here for new concerns.  Final Clinical Impression(s) / ED Diagnoses Final diagnoses:  Colostomy care [facility])    Rx / DC Orders ED Discharge Orders     None         Garlon Hatchet, PA-C 07/13/22 0444    [name], MD 07/13/22 [phone]

## 2022-07-13 NOTE — ED Triage Notes (Signed)
Patient BIB [facility] for evaluation of needing ostomy supplies.  No current complaints.  States he has not been able to pick up ostomy supplies at clinic

## 2022-07-14 ENCOUNTER — Emergency Department (HOSPITAL_BASED_OUTPATIENT_CLINIC_OR_DEPARTMENT_OTHER): Admit: 2022-07-14 | Discharge: 2022-07-14 | Disposition: A | Discharge status: Home / Self Care | Payer: Medicare Other

## 2022-07-14 ENCOUNTER — Encounter (HOSPITAL_COMMUNITY): Payer: Self-pay | Admitting: Emergency Medicine

## 2022-07-14 ENCOUNTER — Emergency Department (HOSPITAL_COMMUNITY)
Admission: EM | Admit: 2022-07-14 | Discharge: 2022-07-14 | Disposition: A | Discharge status: Home / Self Care | Payer: Medicare Other | Attending: Emergency Medicine | Admitting: Emergency Medicine

## 2022-07-14 DIAGNOSIS — I82511 Chronic embolism and thrombosis of right femoral vein: Secondary | ICD-10-CM | POA: Diagnosis not present

## 2022-07-14 DIAGNOSIS — R2241 Localized swelling, mass and lump, right lower limb: Secondary | ICD-10-CM | POA: Diagnosis present

## 2022-07-14 DIAGNOSIS — M7989 Other specified soft tissue disorders: Secondary | ICD-10-CM

## 2022-07-14 LAB — CBC WITH DIFFERENTIAL/PLATELET
Abs Immature Granulocytes: 0.03 10*3/uL (ref 0.00–0.07)
Basophils Absolute: 0 10*3/uL (ref 0.0–0.1)
Basophils Relative: 0 %
Eosinophils Absolute: 0.3 10*3/uL (ref 0.0–0.5)
Eosinophils Relative: 3 %
HCT: 34.9 % — ABNORMAL LOW (ref 39.0–52.0)
Hemoglobin: 11.1 g/dL — ABNORMAL LOW (ref 13.0–17.0)
Immature Granulocytes: 0 %
Lymphocytes Relative: 19 %
Lymphs Abs: 2.2 10*3/uL (ref 0.7–4.0)
MCH: 29.6 pg (ref 26.0–34.0)
MCHC: 31.8 g/dL (ref 30.0–36.0)
MCV: 93.1 fL (ref 80.0–100.0)
Monocytes Absolute: 1.3 10*3/uL — ABNORMAL HIGH (ref 0.1–1.0)
Monocytes Relative: 11 %
Neutro Abs: 8.1 10*3/uL — ABNORMAL HIGH (ref 1.7–7.7)
Neutrophils Relative %: 67 %
Platelets: 317 10*3/uL (ref 150–400)
RBC: 3.75 MIL/uL — ABNORMAL LOW (ref 4.22–5.81)
RDW: 13 % (ref 11.5–15.5)
WBC: 12 10*3/uL — ABNORMAL HIGH (ref 4.0–10.5)
nRBC: 0 % (ref 0.0–0.2)

## 2022-07-14 LAB — COMPREHENSIVE METABOLIC PANEL
ALT: 31 U/L (ref 0–44)
AST: 40 U/L (ref 15–41)
Albumin: 3.2 g/dL — ABNORMAL LOW (ref 3.5–5.0)
Alkaline Phosphatase: 96 U/L (ref 38–126)
Anion gap: 4 — ABNORMAL LOW (ref 5–15)
BUN: 16 mg/dL (ref 6–20)
CO2: 24 mmol/L (ref 22–32)
Calcium: 8.7 mg/dL — ABNORMAL LOW (ref 8.9–10.3)
Chloride: 108 mmol/L (ref 98–111)
Creatinine, Ser: 1.66 mg/dL — ABNORMAL HIGH (ref 0.61–1.24)
GFR, Estimated: 52 mL/min — ABNORMAL LOW (ref >60)
Glucose, Bld: 102 mg/dL — ABNORMAL HIGH (ref 70–99)
Potassium: 3.8 mmol/L (ref 3.5–5.1)
Sodium: 136 mmol/L (ref 135–145)
Total Bilirubin: 0.4 mg/dL (ref 0.3–1.2)
Total Protein: 7.3 g/dL (ref 6.5–8.1)

## 2022-07-14 MED ORDER — RIVAROXABAN 20 MG PO TABS: 20.0000 mg | ORAL_TABLET | Freq: Every day | ORAL | 2 refills | Status: DC
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
## 2022-07-14 NOTE — Care Management (Addendum)
Patient requesting medication assistance. He has Medicaid and medicare, is ineligible for [facility] medication assistance. Xaralto card given to patient instructed to give to pharmacy of choice. Patient states he is hungry and has had nothing to eat. Inquired nursing as there is no diet orders. Nursing stated he could have something to eat, gave patient bag lunch

## 2022-07-14 NOTE — Progress Notes (Signed)
Right lower extremity venous duplex has been completed. Preliminary results can be found in CV Proc through chart review.  Results were given to [facility] PA.  07/14/22 9:54 AM Olen Cordial RVT

## 2022-07-14 NOTE — Discharge Instructions (Addendum)
You were seen today for evaluation of a DVT. Please take the prescribed Xarelto daily. Follow up with primary care and vascular surgery for further evaluation and management.   Please be sure to adequately elevate your right lower extremity. You should lay flat with your knee flexed and your ankle above your knee.

## 2022-07-14 NOTE — ED Provider Triage Note (Signed)
Emergency Medicine Provider Triage Evaluation Note  SHERARD SUTCH , a 46 y.o. male  was evaluated in triage.  Pt complains of right leg swelling.  Hx DVT a few weeks ago while in jail, prescribed xarelto and states he has been compliant.  Denies chest pain or SOB.  Review of Systems  Positive: Leg swelling Negative: fever  Physical Exam  BP 130/88 (BP Location: Left Arm)   Pulse 95   Temp 98.2 F (36.8 C) (Oral)   Resp 19   SpO2 96%   Gen:   Awake, no distress   Resp:  Normal effort  MSK:   Moves extremities without difficulty  Other:  Right leg nearly 3x size of left leg, does have some pitting edema, DP pulse intact  Medical Decision Making  Medically screening exam initiated at 12:08 AM.  Appropriate orders placed.  KARY COLAIZZI was informed that the remainder of the evaluation will be completed by another provider, this initial triage assessment does not replace that evaluation, and the importance of remaining in the ED until their evaluation is complete.  Right leg pain/swelling.  Recently diagnosed with DVT in jail, currently on xarelto.  Denies chest pain or SOB.  Will check labs, vascular US unavailable at this hour.   Garlon Hatchet, PA-C 07/14/22 0013
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
## 2022-07-14 NOTE — ED Notes (Signed)
Raymond Bowen(NT) checked the whole lobby's vital signs, this NT called for pt. No response.

## 2022-07-17 ENCOUNTER — Telehealth: Payer: Self-pay

## 2022-07-17 ENCOUNTER — Ambulatory Visit: Payer: Self-pay

## 2022-07-17 NOTE — Progress Notes (Signed)
The patient is familiar with [facility] and was staying in a motel funded by the [facility] last winter.  He did not follow through with the case worker at the motel to secure permanent housing. He plans to go back to the [facility] to speak with a housing caseworker/ [employer] program and also inquire about motel options for this winter.

## 2022-07-17 NOTE — Telephone Encounter (Signed)
Patient came to the clinic this morning and picked up some of his ostomy supplies. He said he could not carry the whole box.   He is still homeless, staying wherever he can.  He said he is trying to find permanent housing and will need to go to [facility] to meet with a caseworker which he has done in the past. He also plans to inquire if the [facility] will have motel rooms available this winter.

## 2022-07-18 ENCOUNTER — Telehealth: Payer: Self-pay

## 2022-07-18 NOTE — Telephone Encounter (Signed)
     Patient  visit on 12/24  at [facility]  Patient stated [facility] didnt do anything for him and he refused the rest of the call and hung up   Have you been able to follow up with your primary care physician? NA  The patient was or was not able to obtain any needed medicine or equipment. NA  Are there diet recommendations that you are having difficulty following? NA  Patient expresses understanding of discharge instructions and education provided has no other needs at this time. NA     Raymond Bowen [facility] Guide, [facility], Care Management  [phone] [address] [place], [place], [place] [address] Phone: [phone] Email: [email or web address]

## 2022-07-23 ENCOUNTER — Telehealth: Payer: Self-pay

## 2022-07-23 NOTE — Telephone Encounter (Signed)
Patient came to the clinic and picked up 5 ostomy bags and wafers as well as skin preps.  I was also provided with bus passes,

## 2022-07-29 ENCOUNTER — Other Ambulatory Visit: Payer: Self-pay

## 2022-07-29 ENCOUNTER — Emergency Department (HOSPITAL_COMMUNITY)
Admission: EM | Admit: 2022-07-29 | Discharge: 2022-07-30 | Disposition: A | Discharge status: Home / Self Care | Payer: Medicare Other | Attending: Emergency Medicine | Admitting: Emergency Medicine

## 2022-07-29 DIAGNOSIS — Z433 Encounter for attention to colostomy: Secondary | ICD-10-CM | POA: Diagnosis not present

## 2022-07-29 DIAGNOSIS — K9409 Other complications of colostomy: Secondary | ICD-10-CM | POA: Diagnosis not present

## 2022-07-29 DIAGNOSIS — Z439 Encounter for attention to unspecified artificial opening: Secondary | ICD-10-CM | POA: Diagnosis not present

## 2022-07-29 NOTE — ED Triage Notes (Signed)
Pt reports needing a colostomy bag due to his sister being out of town and him not being able to get them. Pt denies complaints.

## 2022-07-30 ENCOUNTER — Encounter (HOSPITAL_COMMUNITY): Payer: Self-pay

## 2022-07-30 ENCOUNTER — Telehealth: Payer: Self-pay | Admitting: General Practice

## 2022-07-30 NOTE — Discharge Instructions (Signed)
Please call the shipping company who is supplying your colostomy supplies and have them send it back to your house.  Follow-up with your PCP as needed   Come back to the emergency department if you develop chest pain, shortness of breath, severe abdominal pain, uncontrolled nausea, vomiting, diarrhea.

## 2022-07-30 NOTE — Telephone Encounter (Signed)
Copied from [place] [phone]. Topic: General - Other >> Jul 30, 2022  9:33 AM Everette C wrote: Reason for CRM: The patient has called to share with J. Brazeau that they are coming to pick up their bus passes and colostomy bags  Please contact the patient further if needed

## 2022-07-30 NOTE — Telephone Encounter (Signed)
I met with the patient when he came to the clinic and he needed only 1 ostomy bag.  He said that the shipment of ostomy bags that he thought was never delivered has been found. They are with another family member and he will pick them up today.  He was also provided 2 bus passes and some food from our food pantry.
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
## 2022-07-30 NOTE — ED Notes (Signed)
Gave pt. Colostomy supplies

## 2022-08-14 ENCOUNTER — Telehealth: Payer: Self-pay

## 2022-08-14 NOTE — Telephone Encounter (Signed)
I called patient : [phone] to inquire how he is doing with his ostomy supplies and if he needs a new order placed with [facility]. Message left with call back requested

## 2022-08-21 ENCOUNTER — Ambulatory Visit: Payer: Medicare Other | Admitting: Family Medicine

## 2022-08-27 NOTE — Telephone Encounter (Signed)
I tried to reach the patient again:  [phone] to check on the need for ostomy supplies and the recording stated that the call cannot be completed as dialed.

## 2022-09-05 NOTE — Telephone Encounter (Signed)
The patient called stating he is in need of ostomy supplies.  I told him that we still have some of his supplies in the office and he said he will plan to pick them up today.  I also told him that I will place another order for supplies with [facility] and have them delivered to this clinic.  I spoke to [place] [phone]  and placed another order for ostomy supplies. Lonn Georgia confirmed that the supplies will be delivered to [facility] to my attention.

## 2022-09-06 ENCOUNTER — Telehealth: Payer: Self-pay | Admitting: Emergency Medicine

## 2022-09-06 ENCOUNTER — Telehealth: Payer: Self-pay | Admitting: General Practice

## 2022-09-06 NOTE — Telephone Encounter (Signed)
Copied from [place] [id number]. Topic: General - Other >> Sep 06, 2022  2:32 PM [name] wrote: Reason for CRM: Pt called requesting to speak to someone about his "supplies" pt is requesting to speak to Opal Sidles

## 2022-09-06 NOTE — Telephone Encounter (Signed)
I tried calling him back twice and the recording stated that the call cannot be completed as dialed.   Wendelyn Breslow- I showed Daniella yesterday what supplies to give him.  They are in my office. He is also requesting bus passes. If he calls back, he can just come to the clinic to get when he needs.  Alycia is also aware of what he needs.  Thanks

## 2022-09-06 NOTE — Telephone Encounter (Signed)
Noted  

## 2022-09-06 NOTE — Telephone Encounter (Signed)
Glenetta Borg RN dropped off supplies at [facility] ED

## 2022-09-06 NOTE — Telephone Encounter (Signed)
Pt called back, states that he can get to the office by 6:30pm due to him having to catch the bus and wanted to see if supplies could be left at [place] out to [place], she will leave supplies at ED.  Advised pt, he said thank you.

## 2022-09-10 ENCOUNTER — Emergency Department (HOSPITAL_COMMUNITY)
Admission: EM | Admit: 2022-09-10 | Discharge: 2022-09-10 | Disposition: A | Discharge status: Home / Self Care | Payer: Medicare Other | Attending: Emergency Medicine | Admitting: Emergency Medicine

## 2022-09-10 ENCOUNTER — Encounter (HOSPITAL_COMMUNITY): Payer: Self-pay

## 2022-09-10 ENCOUNTER — Other Ambulatory Visit: Payer: Self-pay

## 2022-09-10 ENCOUNTER — Telehealth: Payer: Self-pay

## 2022-09-10 DIAGNOSIS — Z433 Encounter for attention to colostomy: Secondary | ICD-10-CM | POA: Diagnosis not present

## 2022-09-10 DIAGNOSIS — Z7901 Long term (current) use of anticoagulants: Secondary | ICD-10-CM | POA: Diagnosis not present

## 2022-09-10 DIAGNOSIS — K9409 Other complications of colostomy: Secondary | ICD-10-CM | POA: Diagnosis not present

## 2022-09-10 DIAGNOSIS — T85698A Other mechanical complication of other specified internal prosthetic devices, implants and grafts, initial encounter: Secondary | ICD-10-CM | POA: Diagnosis not present

## 2022-09-10 NOTE — ED Provider Notes (Signed)
[facility] Provider Note   CSN: [id number] Arrival date & time: 09/10/22  [id number]     History  Chief Complaint  Patient presents with   Wound Check    [facility]IB [facility] from [facility] with complaint of wound recheck. Patient presents with abrasion to mid abdomin under the support strap for colostomy bag.     Raymond Bowen is a 47 y.o. male.  The history is provided by the patient and medical records.  Wound Check   47 y.o. M here requesting ostomy supplies.  States he usually gets them from the wellness clinic, however has issues getting there on time so reports they were dropped off at the [facility].  Denies vomiting.  Still having good output from ostomy.  No fever.  Home Medications Prior to Admission medications   Medication Sig Start Date End Date Taking? Authorizing Provider  DULoxetine (CYMBALTA) 60 MG capsule Take 1 capsule (60 mg total) by mouth daily. Patient not taking: Reported on 07/14/2021 03/29/21   Charlott Rakes, MD  furosemide (LASIX) 20 MG tablet TAKE 1 TABLET BY MOUTH ONCE DAILY NEEDED FOR LEG SWELLING. APPOINTMENT REQUIRED FOR FUTURE REFILLS Patient not taking: Reported on 07/14/2021 05/20/21   Charlott Rakes, MD  furosemide (LASIX) 20 MG tablet Take 1 tablet (20 mg total) by mouth daily. 06/24/22   Valarie Merino, MD  methocarbamol (ROBAXIN) 500 MG tablet Take 1 tablet (500 mg total) by mouth every 8 (eight) hours as needed for muscle spasms. Patient not taking: Reported on 07/14/2021 03/29/21   Charlott Rakes, MD  Misc. Devices MISC Colostomy bags. 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 07/14/22 10/12/22  Dorothyann Peng,  PA-C  tamsulosin (FLOMAX) 0.4 MG CAPS capsule Take 1 capsule (0.4 mg total) by mouth daily. Patient not taking: Reported on 07/14/2021 04/12/21   Lucas Mallow, MD      Allergies    Shellfish allergy, Biopatch protective disk-chg [chlorhexidine], and Vancomycin    Review of Systems   Review of Systems  Constitutional:        Ostomy supplies  All other systems reviewed and are negative.   Physical Exam Updated Vital Signs BP (!) 136/94   Pulse 83   Temp 97.7 F (36.5 C)   Resp 16   SpO2 100%   Physical Exam Vitals and nursing note reviewed.  Constitutional:      Appearance: He is well-developed.  HENT:     Head: Normocephalic and atraumatic.  Eyes:     Conjunctiva/sclera: Conjunctivae normal.     Pupils: Pupils are equal, round, and reactive to light.  Cardiovascular:     Rate and Rhythm: Normal rate and regular rhythm.     Heart sounds: Normal heart sounds.  Pulmonary:     Effort: Pulmonary effort is normal.     Breath sounds: Normal breath sounds.  Abdominal:     General: Bowel sounds are normal.     Palpations: Abdomen is soft.     Comments: Multiple well healed surgical incisions, ostomy bag in place with stool output, abrasion noted beneath ostomy bag from belt  Musculoskeletal:  General: Normal range of motion.     Cervical back: Normal range of motion.  Skin:    General: Skin is warm and dry.  Neurological:     Mental Status: He is alert and oriented to person, place, and time.     ED Results / Procedures / Treatments   Labs (all labs ordered are listed, but only abnormal results are displayed) Labs Reviewed - No data to display  EKG None  Radiology No results found.  Procedures Procedures    Medications Ordered in ED Medications - No data to display  ED Course/ Medical Decision Making/ A&P                             Medical Decision Making  47 year old male presenting to the ED requesting his ostomy bags.  Notes in the  chart indicate that his supplies from the wellness clinic were left in the ED, however these were unable to be located anywhere in the ED.  He was ordered 3 new ostomy bags and given.  He does have a small abrasion from his ostomy belt on exam, this does not appear overly infected.  He has no fever.  Do not feel this requires abx.  Can use topical abx ointment if desired.  Return here for new concerns.  Final Clinical Impression(s) / ED Diagnoses Final diagnoses:  Colostomy care [facility])    Rx / [place] Orders ED Discharge Orders     None         Larene Pickett, PA-C 09/10/22 [id number]    Quintella Reichert, MD 09/10/22 [phone]

## 2022-09-10 NOTE — Telephone Encounter (Signed)
The patient came to the clinic this morning requesting ostomy supplies.  He said that he was in the ED this morning and they could not find the ostomy bags that were left for him on 09/06/2022.  He said he did not have transportation to pick up the supplies that were left for him on 2/16.  He also stated that he did not like the ostomy supplies that he was given in ED and would like his supplies that are here at [facility].   I gave him the 3 ostomy bags and wafers that were in the clinic for him.  He said he understands he should follow up with the surgeon/ GI about having the ostomy reversed but he said he has " a lot going on."  He then explained that he lost his disability and is not sure what he needs to do at this time to have it re-instated.  I told him that I could place a referral to [employer] [facility]) for possible assistance and he was in agreement. I told him that there is no guarantee that they can assist but he said that was okay. Referral then placed to [facility].   I also provided him with food from the food pantry.   A new delivery of ostomy supplies arrived after he left the clinic and I called and informed him

## 2022-09-10 NOTE — ED Triage Notes (Signed)
Patient BIB [facility] from home with complaint of wound recheck. Patient presents with abrasion to mid abdomin under the support strap for colostomy bag.

## 2022-09-19 ENCOUNTER — Telehealth: Payer: Self-pay

## 2022-09-19 NOTE — Telephone Encounter (Signed)
I received a message from [facility], Paralegal/ [facility] stating that she was able to reach the patient this afternoon.

## 2022-09-19 NOTE — Telephone Encounter (Signed)
The patient came to the office to inform me that he has not heard from [facility] yet regarding his disability. I told him that I would contact [facility] and inquire if they have been trying to reach him. He confirmed his address again in case they need to send him a letter.   He did not need any ostomy supplies and said that  he has plenty right now.  I did give him bus passes and some food from our food pantry.

## 2022-09-23 ENCOUNTER — Telehealth: Payer: Self-pay

## 2022-09-23 NOTE — Telephone Encounter (Signed)
The patient came to the clinic today to pick up some ostomy supplies.  He stated that Paul/ [facility] called him last week about opening up a disability claim and he said he is supposed to talk to her again today.

## 2022-10-03 ENCOUNTER — Telehealth: Payer: Self-pay | Admitting: Emergency Medicine

## 2022-10-03 NOTE — Telephone Encounter (Signed)
Copied from [place] [id number]. Topic: General - Other >> Oct 02, 2022  2:39 PM Dominique A wrote: Reason for CRM: Pt is calling to speak with Opal Sidles. Pt is wanting to see if Pt can get in touch with Nevin Bloodgood as soon as possible and to please call him back.

## 2022-10-08 NOTE — Telephone Encounter (Signed)
I spoke to the patient and he said he needed to speak with Paula/ Legal Aid.  There was a lot of noise in the background so I could not completely understand what he was saying other than it was regarding opening a case. He said he has called her many times and has not been able to reach her. I told her that I would contact Nevin Bloodgood and I confirmed his best contact number.    Secure email sent to [facility] requesting she contact the patient.

## 2022-10-10 ENCOUNTER — Telehealth: Payer: Self-pay

## 2022-10-10 NOTE — Telephone Encounter (Signed)
Patient came to the clinic today and picked up some of his ostomy supplies.  He also told me that Paula/ [facility] has contacted him and will work with him to have his food stamps re-instated as well as his disability.

## 2022-10-11 NOTE — Telephone Encounter (Signed)
  I called [place] [phone] and left a message for [facility] requesting another order of ostomy supplies for the patient with instructions to send them to [facility] as the patient does not have a permanent address to send them to.  Call back number also left if she has further questions.

## 2022-10-14 ENCOUNTER — Emergency Department (HOSPITAL_COMMUNITY)
Admission: EM | Admit: 2022-10-14 | Discharge: 2022-10-14 | Disposition: A | Discharge status: Home / Self Care | Payer: Medicaid Other | Attending: Emergency Medicine | Admitting: Emergency Medicine

## 2022-10-14 ENCOUNTER — Emergency Department (HOSPITAL_BASED_OUTPATIENT_CLINIC_OR_DEPARTMENT_OTHER): Payer: Medicaid Other

## 2022-10-14 ENCOUNTER — Telehealth: Payer: Self-pay | Admitting: General Practice

## 2022-10-14 DIAGNOSIS — R52 Pain, unspecified: Secondary | ICD-10-CM

## 2022-10-14 DIAGNOSIS — M7989 Other specified soft tissue disorders: Secondary | ICD-10-CM

## 2022-10-14 DIAGNOSIS — N189 Chronic kidney disease, unspecified: Secondary | ICD-10-CM | POA: Insufficient documentation

## 2022-10-14 DIAGNOSIS — I872 Venous insufficiency (chronic) (peripheral): Secondary | ICD-10-CM

## 2022-10-14 DIAGNOSIS — R2241 Localized swelling, mass and lump, right lower limb: Secondary | ICD-10-CM | POA: Diagnosis present

## 2022-10-14 DIAGNOSIS — R600 Localized edema: Secondary | ICD-10-CM | POA: Insufficient documentation

## 2022-10-14 LAB — BASIC METABOLIC PANEL
Anion gap: 11 (ref 5–15)
BUN: 25 mg/dL — ABNORMAL HIGH (ref 6–20)
CO2: 19 mmol/L — ABNORMAL LOW (ref 22–32)
Calcium: 8.6 mg/dL — ABNORMAL LOW (ref 8.9–10.3)
Chloride: 105 mmol/L (ref 98–111)
Creatinine, Ser: 1.63 mg/dL — ABNORMAL HIGH (ref 0.61–1.24)
GFR, Estimated: 53 mL/min — ABNORMAL LOW (ref >60)
Glucose, Bld: 149 mg/dL — ABNORMAL HIGH (ref 70–99)
Potassium: 4 mmol/L (ref 3.5–5.1)
Sodium: 135 mmol/L (ref 135–145)

## 2022-10-14 LAB — CBC WITH DIFFERENTIAL/PLATELET
Abs Immature Granulocytes: 0.04 10*3/uL (ref 0.00–0.07)
Basophils Absolute: 0.1 10*3/uL (ref 0.0–0.1)
Basophils Relative: 1 %
Eosinophils Absolute: 0.2 10*3/uL (ref 0.0–0.5)
Eosinophils Relative: 2 %
HCT: 37.8 % — ABNORMAL LOW (ref 39.0–52.0)
Hemoglobin: 12.4 g/dL — ABNORMAL LOW (ref 13.0–17.0)
Immature Granulocytes: 0 %
Lymphocytes Relative: 14 %
Lymphs Abs: 1.7 10*3/uL (ref 0.7–4.0)
MCH: 29.1 pg (ref 26.0–34.0)
MCHC: 32.8 g/dL (ref 30.0–36.0)
MCV: 88.7 fL (ref 80.0–100.0)
Monocytes Absolute: 0.9 10*3/uL (ref 0.1–1.0)
Monocytes Relative: 7 %
Neutro Abs: 9.2 10*3/uL — ABNORMAL HIGH (ref 1.7–7.7)
Neutrophils Relative %: 76 %
Platelets: 263 10*3/uL (ref 150–400)
RBC: 4.26 MIL/uL (ref 4.22–5.81)
RDW: 17.4 % — ABNORMAL HIGH (ref 11.5–15.5)
WBC: 12.1 10*3/uL — ABNORMAL HIGH (ref 4.0–10.5)
nRBC: 0 % (ref 0.0–0.2)

## 2022-10-14 MED ORDER — FUROSEMIDE 20 MG PO TABS
20.0000 mg | ORAL_TABLET | Freq: Every day | ORAL | 0 refills | Status: DC | PRN
  Filled 2022-10-15: qty 10, 10d supply, fill #0

## 2022-10-14 NOTE — ED Provider Triage Note (Addendum)
Emergency Medicine Provider Triage Evaluation Note  MANPREET MIZZELL , a 47 y.o. male  was evaluated in triage.  Pt complains of RLE swelling onset 1 week. Notes that he has a history of DVT, however, no thinners at this time.  Was evaluated earlier today and told to come to the emergency department to rule out a blood clot.  Notes he has not been evaluated previously in the ED for his leg swelling.  Denies history of CHF.  Denies chest pain or shortness of breath.  Review of Systems  Positive:  Negative:   Physical Exam  BP (!) 148/77 (BP Location: Right Arm)   Pulse 70   Temp 98.9 F (37.2 C)   Resp 18   SpO2 100%  Gen:   Awake, no distress   Resp:  Normal effort  MSK:   Moves extremities without difficulty  Other:  Moderate unilateral swelling to the right lower extremity noted.  Medical Decision Making  Medically screening exam initiated at 2:21 PM.  Appropriate orders placed.  SELENA RUBANO was informed that the remainder of the evaluation will be completed by another provider, this initial triage assessment does not replace that evaluation, and the importance of remaining in the ED until their evaluation is complete.  [place] ordered, work-up initiated.    Epifania Littrell A, PA-C 10/14/22 1421   3:12 PM - [place] noted patient negative for DVT.    Jedaiah Rathbun A, PA-C 10/14/22 1512

## 2022-10-14 NOTE — ED Notes (Signed)
Given happy meal snack at d/c

## 2022-10-14 NOTE — Telephone Encounter (Signed)
The patient came to the clinic this afternoon. He said he was expecting a call from [place] about his disability.  He stated that he spoke to her last week but she said she would call him back and he didn't hear from her.  I confirmed his phone number and said that I would contact [place].  He then said that his RLE is painful and swollen.  I instructed him to go to the ED now for evaluation and he said he would because the ED is right across the street.  I stressed the importance of having his leg evaluated.  There is no availability to be seen at [facility] this afternoon and imaging would need to be done. He said he understood and would go there now.  I told him that we need to schedule him with a provider at [facility] for follow up and I would be in contact with him to schedule the appointment.   Email sent to [facility], paralegal/ [facility] informing her that the patient was excepting to hear from her.  Patient given 2 bus passes and some food from our food pantry.
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
## 2022-10-14 NOTE — ED Notes (Signed)
EDP in to see, at BS.  

## 2022-10-14 NOTE — ED Notes (Signed)
EDP at BS 

## 2022-10-14 NOTE — ED Triage Notes (Signed)
Patient here for evaluation of right leg swelling and pain that started one week ago. Patient reports history of DVT but does not current take a blood thinner. Patient is alert, oriented, and in no apparent distress at this time.

## 2022-10-14 NOTE — Discharge Instructions (Addendum)
Follow up with your doctor

## 2022-10-14 NOTE — ED Notes (Signed)
ED provider at bedside.

## 2022-10-14 NOTE — Telephone Encounter (Signed)
Copied from [place] [id number]. Topic: General - Inquiry >> Oct 14, 2022 12:03 PM Erskine Squibb wrote: Reason for CRM: The patient called in specifically wanting to speak with Raymond Bowen. He states its about his disability. He is also requesting some bus passes to go see someone about his leg. Please assist patient further

## 2022-10-14 NOTE — Progress Notes (Signed)
RLE venous duplex has been completed.  Preliminary results given to [employer], PA-C.   Results can be found under chart review under CV PROC. 10/14/2022 3:11 PM Najma Bozarth RVT, RDMS

## 2022-10-15 ENCOUNTER — Other Ambulatory Visit: Payer: Self-pay

## 2022-10-15 NOTE — Telephone Encounter (Signed)
The patient came to the clinic today and informed me of the outcome of his ED visit yesterday.  He requested that the new order or furosemide be transferred to [facility].  I notified Crissie Reese, Pharm Tech and she was transferring the prescription for the patient as he requested.  I also scheduled him for an appointment at [facility] with Epimenio Sarin, [place] for 10/30/2022 and provided him with a written reminder of the appointment

## 2022-10-17 ENCOUNTER — Telehealth: Payer: Self-pay

## 2022-10-17 NOTE — Telephone Encounter (Signed)
I spoke to [place] and placed an order for ostomy supplies for the patient. I confirmed that they will be delivered to my attention at [facility].  Delayna said that they would be shipped out tomorrow.

## 2022-10-21 NOTE — Telephone Encounter (Signed)
Call received from the patient stating that he is in need of ostomy supplies.  I told him that we still have 5 bags in the clinic and he can come to pick them up.  I also told him that I placed a new order or supplies last week, 10/17/2022 and they should be delivered this week.

## 2022-10-23 NOTE — Telephone Encounter (Signed)
Patient came to the clinic today and picked up some of his ostomy supplies as well as some food from our clinic food pantry

## 2022-10-23 NOTE — Telephone Encounter (Signed)
Patient's ostomy supplies have been received from [facility]

## 2022-10-30 ENCOUNTER — Ambulatory Visit: Payer: Medicaid Other | Admitting: Physician Assistant

## 2022-11-04 ENCOUNTER — Telehealth: Payer: Self-pay

## 2022-11-04 NOTE — Telephone Encounter (Signed)
The patient came to the clinic today and picked up some ostomy supplies

## 2022-11-11 ENCOUNTER — Telehealth: Payer: Self-pay

## 2022-11-11 NOTE — Telephone Encounter (Signed)
The patient came to the clinic today and picked up some ostomy supplies.  He had an appointment with Dr [name] scheduled for 11/21/2022 but I asked him if he would like to be seen sooner as he needs to re-establish care with PCP and he said yes.  I scheduled him with Dr [name] tomorrow,11/12/2022 @ 1410 and give him a printed reminded.  Marland Kitchen He was also given food from the clinic food pantry and 2 bus passes.

## 2022-11-12 ENCOUNTER — Ambulatory Visit: Payer: Medicaid Other | Admitting: Family Medicine

## 2022-11-21 ENCOUNTER — Ambulatory Visit: Payer: Medicaid Other | Admitting: Family Medicine

## 2022-11-22 ENCOUNTER — Telehealth: Payer: Self-pay

## 2022-11-22 NOTE — Telephone Encounter (Signed)
I called [facility] [phone] and left a message for [facility] requesting another order of ostomy supplies for the patient with instructions to send them to [facility] as the patient does not have a permanent address to send them to.  Call back number also left if she has questions

## 2022-11-26 NOTE — Telephone Encounter (Signed)
I called [facility] to confirm that the refill request for supplies was received.  I spoke to Tammy who said she could not find any documentation that Kayla received the request.    Tammy then placed the refill order and confirmed that the supplies will be sent to [facility] [address], [place], to my attention. She stated that we should receive the delivery in 1-2 days.

## 2022-12-20 ENCOUNTER — Emergency Department (HOSPITAL_COMMUNITY)
Admission: EM | Admit: 2022-12-20 | Discharge: 2022-12-20 | Payer: Medicaid Other | Attending: Emergency Medicine | Admitting: Emergency Medicine

## 2022-12-20 ENCOUNTER — Encounter (HOSPITAL_COMMUNITY): Payer: Self-pay

## 2022-12-20 ENCOUNTER — Emergency Department (HOSPITAL_BASED_OUTPATIENT_CLINIC_OR_DEPARTMENT_OTHER): Payer: Medicaid Other

## 2022-12-20 DIAGNOSIS — E871 Hypo-osmolality and hyponatremia: Secondary | ICD-10-CM | POA: Diagnosis not present

## 2022-12-20 DIAGNOSIS — Z7901 Long term (current) use of anticoagulants: Secondary | ICD-10-CM | POA: Insufficient documentation

## 2022-12-20 DIAGNOSIS — M79604 Pain in right leg: Secondary | ICD-10-CM | POA: Diagnosis present

## 2022-12-20 DIAGNOSIS — I82531 Chronic embolism and thrombosis of right popliteal vein: Secondary | ICD-10-CM | POA: Diagnosis not present

## 2022-12-20 DIAGNOSIS — M79661 Pain in right lower leg: Secondary | ICD-10-CM | POA: Diagnosis not present

## 2022-12-20 LAB — BASIC METABOLIC PANEL
Anion gap: 9 (ref 5–15)
BUN: 17 mg/dL (ref 6–20)
CO2: 23 mmol/L (ref 22–32)
Calcium: 8.8 mg/dL — ABNORMAL LOW (ref 8.9–10.3)
Chloride: 101 mmol/L (ref 98–111)
Creatinine, Ser: 1.36 mg/dL — ABNORMAL HIGH (ref 0.61–1.24)
GFR, Estimated: >60 mL/min (ref >60)
Glucose, Bld: 87 mg/dL (ref 70–99)
Potassium: 4.2 mmol/L (ref 3.5–5.1)
Sodium: 133 mmol/L — ABNORMAL LOW (ref 135–145)

## 2022-12-20 LAB — PROTIME-INR
INR: 1.1 (ref 0.8–1.2)
Prothrombin Time: 14.3 seconds (ref 11.4–15.2)

## 2022-12-20 LAB — CBC
HCT: 41.3 % (ref 39.0–52.0)
Hemoglobin: 13.1 g/dL (ref 13.0–17.0)
MCH: 29.2 pg (ref 26.0–34.0)
MCHC: 31.7 g/dL (ref 30.0–36.0)
MCV: 92 fL (ref 80.0–100.0)
Platelets: 187 10*3/uL (ref 150–400)
RBC: 4.49 MIL/uL (ref 4.22–5.81)
RDW: 14 % (ref 11.5–15.5)
WBC: 10.1 10*3/uL (ref 4.0–10.5)
nRBC: 0 % (ref 0.0–0.2)

## 2022-12-20 LAB — APTT: aPTT: 29 seconds (ref 24–36)

## 2022-12-20 NOTE — ED Triage Notes (Signed)
Pt coming from [facility] in [place]. Pt has been having pain in his right lower leg for about one month. Two days he had an ultrasound done of his right leg. Sent here for finding multiple DVTs in his right leg

## 2022-12-20 NOTE — ED Provider Notes (Signed)
[facility] EMERGENCY DEPARTMENT AT [facility] Provider Note   CSN: [id number] Arrival date & time: 12/20/22  1210     History {Add pertinent medical, surgical, social history, OB history to HPI:1} No chief complaint on file.   Raymond Bowen is a 47 y.o. male with PMHx DVT associated with trauma (2016) currently on Eliquis who presents to ED complaining of right leg pain and swelling. Symptoms started around 6 weeks ago - patient states that he had an ultrasound at that time that was not concerning for DVT. Patient has been in [employer] [facility] x1 month and states that he has been constantly complaining of right leg pain. Patient received another [place] 2 days ago.  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 Extensive subacute to chronic occlusive DVT is noted within the right popliteal, right posterior tibial veins. In comparison to prior study findings are mildly worsened." Patient has been receiving Eliquis 5mg  BID and Lasix 20mg  PO once daily while in [place].  Patient denies fever, chills, chest pain, dyspnea, palpitations, abdominal pain, recent leg trauma.  HPI     Home Medications Prior to Admission medications   Medication Sig Start Date End Date Taking? Authorizing Provider  DULoxetine (CYMBALTA) 60 MG capsule Take 1 capsule (60 mg total) by mouth daily. Patient not taking: Reported on 07/14/2021 03/29/21   Hoy Register, MD  furosemide (LASIX) 20 MG tablet Take 1 tablet (20 mg total) by mouth daily as needed. 10/14/22   Benjiman Core, MD  methocarbamol (ROBAXIN) 500 MG tablet Take 1 tablet (500 mg total) by mouth every 8 (eight) hours as needed for muscle spasms. Patient not taking: Reported on 07/14/2021 03/29/21   Hoy Register, MD  Misc. Devices MISC Colostomy bags. Diagnosis- Colostomy in place Patient not taking:  Reported on 07/14/2021 05/10/20   Hoy Register, MD  pregabalin (LYRICA) 100 MG capsule Take 1 capsule by mouth twice daily Patient not taking: Reported on 09/16/2021 07/18/21   Hoy Register, MD  rivaroxaban (XARELTO) 20 MG TABS tablet Take 1 tablet (20 mg total) by mouth daily with supper. 07/14/22 10/12/22  Darrick Grinder, PA-C  tamsulosin (FLOMAX) 0.4 MG CAPS capsule Take 1 capsule (0.4 mg total) by mouth daily. Patient not taking: Reported on 07/14/2021 04/12/21   Crista Elliot, MD      Allergies    Shellfish allergy, Biopatch protective disk-chg [chlorhexidine], and Vancomycin    Review of Systems   Review of Systems  Physical Exam Updated Vital Signs BP (!) 136/103   Pulse 75   Temp 98.5 F (36.9 C) (Oral)   Resp 17   SpO2 100%  Physical Exam Vitals and nursing note reviewed.  Constitutional:      General: He is not in acute distress. HENT:     Head: Normocephalic and atraumatic.     Mouth/Throat:     Mouth: Mucous membranes are moist.     Pharynx: No oropharyngeal exudate or posterior oropharyngeal erythema.  Eyes:     General: No scleral icterus.       Right eye: No discharge.        Left eye: No discharge.     Conjunctiva/sclera: Conjunctivae normal.  Cardiovascular:     Rate and Rhythm: Normal rate and regular rhythm.     Pulses: Normal pulses.     Heart sounds: Normal heart sounds.  No murmur heard. Pulmonary:     Effort: Pulmonary effort is normal. No respiratory distress.     Breath sounds: Normal breath sounds. No wheezing, rhonchi or rales.  Abdominal:     Comments: Chronic scarring and ostomy bag on abdomen  Musculoskeletal:     Right lower leg: No edema.     Left lower leg: No edema.  Skin:    General: Skin is warm and dry.     Findings: No rash.  Neurological:     General: No focal deficit present.     Mental Status: He is alert. Mental status is at baseline.  Psychiatric:        Mood and Affect: Mood normal.     ED Results /  Procedures / Treatments   Labs (all labs ordered are listed, but only abnormal results are displayed) Labs Reviewed  BASIC METABOLIC PANEL  CBC  APTT  PROTIME-INR    EKG None  Radiology No results found.  Procedures Procedures  {Document cardiac monitor, telemetry assessment procedure when appropriate:1}  Medications Ordered in ED Medications - No data to display  ED Course/ Medical Decision Making/ A&P   {   Click here for ABCD2, HEART and other calculatorsREFRESH Note before signing :1}                          Medical Decision Making Amount and/or Complexity of Data Reviewed Labs: ordered.   ***  {Document critical care time when appropriate:1} {Document review of labs and clinical decision tools ie heart score, Chads2Vasc2 etc:1}  {Document your independent review of radiology images, and any outside records:1} {Document your discussion with family members, caretakers, and with consultants:1} {Document social determinants of health affecting pt's care:1} {Document your decision making why or why not admission, treatments were needed:1} Final Clinical Impression(s) / ED Diagnoses Final diagnoses:  None    Rx / DC Orders ED Discharge Orders     None

## 2022-12-20 NOTE — Discharge Instructions (Addendum)
It was a pleasure caring for you today. US showed your chronic DVT's - no concern for total occlusion or new DVTs. I recommend continuing to take your Eliquis twice daily as prescribed.   Seek emergency care if experiencing any new or worsening symptoms such as chest pain, troubles breathing, or loss of consciousness

## 2022-12-20 NOTE — Progress Notes (Signed)
Right lower extremity venous study completed.   Preliminary results relayed to PA in ER.  Please see CV Procedures for preliminary results.  Enyla Lisbon, RVT  2:41 PM 12/20/22
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
## 2023-01-28 ENCOUNTER — Ambulatory Visit (INDEPENDENT_AMBULATORY_CARE_PROVIDER_SITE_OTHER): Payer: Medicaid Other | Admitting: Vascular Surgery

## 2023-01-28 ENCOUNTER — Encounter: Payer: Self-pay | Admitting: Vascular Surgery

## 2023-01-28 VITALS — BP 119/80 | HR 88 | Temp 98.6°F | Resp 20 | Ht 67.0 in

## 2023-01-28 DIAGNOSIS — I872 Venous insufficiency (chronic) (peripheral): Secondary | ICD-10-CM

## 2023-01-28 DIAGNOSIS — I82511 Chronic embolism and thrombosis of right femoral vein: Secondary | ICD-10-CM | POA: Diagnosis not present

## 2023-02-18 ENCOUNTER — Encounter (INDEPENDENT_AMBULATORY_CARE_PROVIDER_SITE_OTHER): Payer: Self-pay | Admitting: Vascular Surgery

## 2023-03-18 ENCOUNTER — Other Ambulatory Visit: Payer: Self-pay

## 2023-03-18 ENCOUNTER — Telehealth: Payer: Self-pay

## 2023-03-18 ENCOUNTER — Encounter (HOSPITAL_COMMUNITY): Payer: Self-pay

## 2023-03-18 ENCOUNTER — Emergency Department (HOSPITAL_COMMUNITY)
Admission: EM | Admit: 2023-03-18 | Discharge: 2023-03-19 | Disposition: A | Discharge status: Home / Self Care | Payer: Medicaid Other | Attending: Emergency Medicine | Admitting: Emergency Medicine

## 2023-03-18 ENCOUNTER — Ambulatory Visit: Payer: Medicaid Other | Attending: Nurse Practitioner | Admitting: Nurse Practitioner

## 2023-03-18 ENCOUNTER — Encounter: Payer: Self-pay | Admitting: Nurse Practitioner

## 2023-03-18 VITALS — BP 166/90 | HR 91 | Ht 67.0 in | Wt 182.4 lb

## 2023-03-18 DIAGNOSIS — M7989 Other specified soft tissue disorders: Secondary | ICD-10-CM | POA: Diagnosis not present

## 2023-03-18 DIAGNOSIS — Z86718 Personal history of other venous thrombosis and embolism: Secondary | ICD-10-CM | POA: Insufficient documentation

## 2023-03-18 DIAGNOSIS — Z9889 Other specified postprocedural states: Secondary | ICD-10-CM | POA: Diagnosis not present

## 2023-03-18 DIAGNOSIS — M79661 Pain in right lower leg: Secondary | ICD-10-CM | POA: Diagnosis not present

## 2023-03-18 DIAGNOSIS — R2241 Localized swelling, mass and lump, right lower limb: Secondary | ICD-10-CM | POA: Insufficient documentation

## 2023-03-18 DIAGNOSIS — U071 COVID-19: Secondary | ICD-10-CM | POA: Insufficient documentation

## 2023-03-18 DIAGNOSIS — Z760 Encounter for issue of repeat prescription: Secondary | ICD-10-CM | POA: Diagnosis present

## 2023-03-18 DIAGNOSIS — E871 Hypo-osmolality and hyponatremia: Secondary | ICD-10-CM | POA: Diagnosis not present

## 2023-03-18 DIAGNOSIS — R0981 Nasal congestion: Secondary | ICD-10-CM | POA: Diagnosis present

## 2023-03-18 DIAGNOSIS — M792 Neuralgia and neuritis, unspecified: Secondary | ICD-10-CM

## 2023-03-18 DIAGNOSIS — Z79899 Other long term (current) drug therapy: Secondary | ICD-10-CM | POA: Insufficient documentation

## 2023-03-18 DIAGNOSIS — M79604 Pain in right leg: Secondary | ICD-10-CM | POA: Insufficient documentation

## 2023-03-18 DIAGNOSIS — Z7901 Long term (current) use of anticoagulants: Secondary | ICD-10-CM | POA: Insufficient documentation

## 2023-03-18 DIAGNOSIS — Z933 Colostomy status: Secondary | ICD-10-CM | POA: Diagnosis not present

## 2023-03-18 LAB — SARS CORONAVIRUS 2 BY RT PCR: SARS Coronavirus 2 by RT PCR: POSITIVE — AB

## 2023-03-18 MED ORDER — FUROSEMIDE 20 MG PO TABS
20.0000 mg | ORAL_TABLET | Freq: Every day | ORAL | 0 refills | Status: DC | PRN
Start: 2023-03-18 — End: 2023-04-07
  Filled 2023-03-18: qty 10, 10d supply, fill #0

## 2023-03-18 MED ORDER — PREGABALIN 100 MG PO CAPS
100.0000 mg | ORAL_CAPSULE | Freq: Two times a day (BID) | ORAL | 0 refills | Status: DC
Start: 2023-03-18 — End: 2023-04-07
  Filled 2023-03-18: qty 60, 30d supply, fill #0

## 2023-03-18 NOTE — Telephone Encounter (Signed)
The patient came to the clinic today and picked up additional ostomy supplies.  He still does not have a permanent residence.  I also provided him with food from the food pantry and bus passes.  He was going to the ED after leaving the clinic as recommended by the provider that saw him this afternoon

## 2023-03-18 NOTE — ED Notes (Signed)
Paged VAS Tech

## 2023-03-18 NOTE — ED Notes (Signed)
Pt was outside when called and informed that they was still present.

## 2023-03-18 NOTE — ED Notes (Signed)
Patient called x3 to sign MSE form, no answer
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
## 2023-03-18 NOTE — ED Triage Notes (Signed)
PT arrives via POV. PT states he is concerned he might have a blood clot in his right leg. C/o pain in right leg for 1 week. No injuries to leg. Pt also has some nasal congestion.

## 2023-03-18 NOTE — Telephone Encounter (Signed)
I was by Elsie Lincoln, RN that the patient picked up ostomy supplies on 03/14/2023.

## 2023-03-19 ENCOUNTER — Ambulatory Visit (HOSPITAL_COMMUNITY): Payer: Medicaid Other | Attending: Vascular Surgery

## 2023-03-19 MED ORDER — ALBUTEROL SULFATE HFA 108 (90 BASE) MCG/ACT IN AERS
2.0000 | INHALATION_SPRAY | Freq: Once | RESPIRATORY_TRACT | Status: AC
  Administered 2023-03-19: 2 via RESPIRATORY_TRACT
  Filled 2023-03-19: qty 6.7

## 2023-03-19 MED ORDER — ENOXAPARIN SODIUM 80 MG/0.8ML IJ SOSY
80.0000 mg | PREFILLED_SYRINGE | Freq: Once | INTRAMUSCULAR | Status: AC
  Administered 2023-03-19: 80 mg via SUBCUTANEOUS
  Filled 2023-03-19: qty 0.8

## 2023-03-19 MED ORDER — NAPROXEN 250 MG PO TABS
500.0000 mg | ORAL_TABLET | Freq: Once | ORAL | Status: AC
  Administered 2023-03-19: 500 mg via ORAL
  Filled 2023-03-19: qty 2

## 2023-03-19 MED ORDER — AEROCHAMBER PLUS FLO-VU LARGE MISC
1.0000 | Freq: Once | Status: AC
  Administered 2023-03-19: 1
  Filled 2023-03-19: qty 1

## 2023-03-19 NOTE — ED Provider Notes (Signed)
[place] EMERGENCY DEPARTMENT AT [facility] Provider Note   CSN: [id number] Arrival date & time: 03/18/23  1710     History  Chief Complaint  Patient presents with   Leg Pain   Nasal Congestion    Raymond Bowen is a 47 y.o. male.  47 year old male presents to the emergency department for evaluation of right lower extremity pain and swelling.  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  Was previously on Eliquis BID, but states this was discontinued around July while incarcerated.  He states that RLE pain/swelling have been aggravating him for the past week.  Patient saw his PCP for this yesterday who ordered an outpatient [place] to be completed today (03/19/23); patient denies any change to his symptoms since PCP evaluation.  As an aside, notes some cough, congestion, myalgias as well as nasal congestion.  No known sick contacts.   The history is provided by the patient. No language interpreter was used.  Leg Pain      Home Medications Prior to Admission medications   Medication Sig Start Date End Date Taking? Authorizing Provider  furosemide (LASIX) 20 MG tablet Take 1 tablet (20 mg total) by mouth daily as needed. For leg swelling 03/18/23   Claiborne Rigg, NP  Misc. Devices MISC Colostomy bags. 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 03/18/23   Claiborne Rigg, NP      Allergies    Shellfish allergy, Biopatch protective disk-chg [chlorhexidine], and Vancomycin    Review of Systems   Review of Systems Ten systems reviewed and are negative for acute change, except as noted in the HPI.    Physical Exam Updated Vital Signs BP 134/81   Pulse 94   Temp 98.4 F (36.9 C)   Resp 16   Ht 5\' 7"  (1.702 m)   Wt 82.6 kg   SpO2 100%   BMI 28.51 kg/m   Physical Exam Vitals and nursing note reviewed.  Constitutional:      General: He is not in acute distress.     Appearance: He is well-developed. He is not diaphoretic.     Comments: Nontoxic appearing and in NAD  HENT:     Head: Normocephalic and atraumatic.  Eyes:     General: No scleral icterus.    Conjunctiva/sclera: Conjunctivae normal.  Cardiovascular:     Rate and Rhythm: Normal rate and regular rhythm.     Pulses: Normal pulses.     Comments: DP pulse 2+ in the RLE Pulmonary:     Effort: Pulmonary effort is normal. No respiratory distress.     Comments: Minimal expiratory wheeze. Moving air well.  Musculoskeletal:     Cervical back: Normal range of motion.     Comments: Edema of the RLE compared to left extending up to the knee. Compression stockings in place. Extremity is warm, well perfused.  Compartments compressible.  Normal ROM of the RLE.  Skin:    General: Skin is warm and dry.     Coloration: Skin is not pale.     Findings: No erythema or rash.  Neurological:     Mental Status: He is alert and oriented to person, place, and time.     Coordination: Coordination normal.  Psychiatric:        Behavior: Behavior normal.     ED Results / Procedures / Treatments  Labs (all labs ordered are listed, but only abnormal results are displayed) Labs Reviewed  SARS CORONAVIRUS 2 BY RT PCR - Abnormal; Notable for the following components:      Result Value   SARS Coronavirus 2 by RT PCR POSITIVE (*)    All other components within normal limits    EKG None  Radiology No results found.  Procedures Procedures    Medications Ordered in ED Medications  AeroChamber Plus Flo-Vu Large MISC 1 each (1 each Other Given 03/19/23 0208)  albuterol (VENTOLIN HFA) 108 (90 Base) MCG/ACT inhaler 2 puff (2 puffs Inhalation Given 03/19/23 0140)  naproxen (NAPROSYN) tablet 500 mg (500 mg Oral Given 03/19/23 0140)  enoxaparin (LOVENOX) injection 80 mg (80 mg Subcutaneous Given 03/19/23 0140)    ED Course/ Medical Decision Making/ A&P Clinical Course as of 03/19/23 0226  Wed Mar 19, 2023   0024 I have reviewed the patient's prior vascular studies.  While he did have a DVT study in March 2024 that was interpreted as normal, his subsequent study in May 2024 showed chronic DVT in the right femoral and popliteal veins.  This is consistent with DVT imaging dating back to at least 2022.  Question whether the study in March was interpreted incorrectly. [KH]    Clinical Course User Index [KH] Antony Madura, PA-C                                 Medical Decision Making Risk Prescription drug management.   This patient presents to the ED for concern of RLE pain, this involves an extensive number of treatment options, and is a complaint that carries with it a high risk of complications and morbidity.  The differential diagnosis includes DVT vs cellulitis vs fracture vs compartment syndrome vs dependent edema.   Co morbidities that complicate the patient evaluation  Chronic DVT CKD   Additional history obtained:  External records from outside source obtained and reviewed including prior venous duplex US studies dating back to 2022; see above ED course.   Lab Tests:  I Ordered, and personally interpreted labs.  The pertinent results include:  COVID positive   Cardiac Monitoring:  The patient was maintained on a cardiac monitor.  I personally viewed and interpreted the cardiac monitored which showed an underlying rhythm of: NSR   Medicines ordered and prescription drug management:  I ordered medication including Lovenox for VTE treatment pending venous duplex study. Also given albuterol inhaler for cough, COVID symptoms Reevaluation of the patient after these medicines showed that the patient stayed the same I have reviewed the patients home medicines and have made adjustments as needed   Test Considered:  CXR   Problem List / ED Course:  As above Patient complaining of pain and swelling in the right lower extremity.  This is a chronic complaint to some degree with  well-documented history of chronic DVT in the past.  The patient was previously on Eliquis for management.  It appears that this was discontinued around July while the patient was incarcerated. The patient is neurovascularly intact on exam.  He does have edema to his right lower extremity.  No signs of secondary infection, cellulitis.  Compartments are soft, compressible.  Extremity warm, well-perfused. Unable to obtain vascular imaging in the emergency department at this hour.  This was scheduled for the patient by his PCP at an office visit yesterday.  Venous duplex to be completed at  1500 today.  He was given a dose of Lovenox prior to discharge for anticoagulation coverage pending his outpatient imaging. Incidentally, patient also with upper respiratory symptoms.  He has tested positive for COVID today.  Given albuterol inhaler for mild expiratory wheezing.  No overt respiratory distress, hypoxia or tachypnea.  Moving air well on exam.  May continue with outpatient supportive measures.  No indication for further emergent workup at this time.   Reevaluation:  After the interventions noted above, I reevaluated the patient and found that they have :stayed the same   Social Determinants of Health:  Lack of social support   Dispostion:  After consideration of the diagnostic results and the patients response to treatment, I feel that the patent would benefit from f/u with [facility] today for venous duplex study. Supportive care measures advised for COVID symptoms. Return precautions discussed and provided. Patient discharged in stable condition with no unaddressed concerns.         Final Clinical Impression(s) / ED Diagnoses Final diagnoses:  Swelling of right lower extremity  COVID-19 virus infection    Rx / DC Orders ED Discharge Orders     None         Antony Madura, PA-C 03/19/23 0236    [name], MD 03/19/23 [phone]

## 2023-03-19 NOTE — Discharge Instructions (Addendum)
IMPORTANT PATIENT INSTRUCTIONS:  You have been scheduled for an Outpatient Vascular Study at [address] [facility]. Your ultrasound is scheduled for 3PM. Be sure to keep this appointment.  Follow up with your primary care doctor in the interim, if needed.  With regards to your COVID diagnosis, take tylenol for fever, headaches, body aches. Drink plenty of fluids to prevent dehydration. Use 2 puffs of an inhaler every 4-6 hours for cough, wheezing, shortness of breath. You may continue to use other over-the-counter remedies for symptom control, if desired. Return for new or concerning symptoms such as worsening shortness of breath, coughing up blood, persistent vomiting, loss of consciousness.

## 2023-03-31 ENCOUNTER — Other Ambulatory Visit: Payer: Self-pay

## 2023-03-31 ENCOUNTER — Encounter (HOSPITAL_COMMUNITY): Payer: Self-pay | Admitting: Emergency Medicine

## 2023-03-31 ENCOUNTER — Telehealth: Payer: Self-pay

## 2023-03-31 ENCOUNTER — Emergency Department (HOSPITAL_COMMUNITY)
Admission: EM | Admit: 2023-03-31 | Discharge: 2023-04-01 | Disposition: A | Discharge status: Home / Self Care | Payer: Medicaid Other | Attending: Emergency Medicine | Admitting: Emergency Medicine

## 2023-03-31 DIAGNOSIS — R600 Localized edema: Secondary | ICD-10-CM | POA: Insufficient documentation

## 2023-03-31 DIAGNOSIS — M7989 Other specified soft tissue disorders: Secondary | ICD-10-CM | POA: Diagnosis present

## 2023-03-31 DIAGNOSIS — Z7901 Long term (current) use of anticoagulants: Secondary | ICD-10-CM | POA: Insufficient documentation

## 2023-03-31 NOTE — Telephone Encounter (Signed)
Patient in the office today for office ostomy supplies . 6 colostomy bags and wafers given. Along with three bus passes. Patient given appointment with PCP for follow-up for edematous leg .  Patient is aware of the day and time of appointment.

## 2023-03-31 NOTE — ED Triage Notes (Signed)
Patient coming to ED for evaluation of R leg swelling.  Reports symptoms started 2 weeks ago.  Having numbness and tingling.  States "they were suppose to check it for a blood clot or something."

## 2023-04-01 ENCOUNTER — Other Ambulatory Visit (HOSPITAL_COMMUNITY): Payer: Self-pay

## 2023-04-01 ENCOUNTER — Ambulatory Visit (HOSPITAL_COMMUNITY): Payer: Medicaid Other | Attending: Emergency Medicine

## 2023-04-01 ENCOUNTER — Emergency Department (HOSPITAL_COMMUNITY)
Admission: EM | Admit: 2023-04-01 | Discharge: 2023-04-02 | Disposition: A | Discharge status: Home / Self Care | Payer: Medicaid Other | Attending: Emergency Medicine | Admitting: Emergency Medicine

## 2023-04-01 ENCOUNTER — Encounter (HOSPITAL_COMMUNITY): Payer: Self-pay

## 2023-04-01 ENCOUNTER — Other Ambulatory Visit: Payer: Self-pay

## 2023-04-01 DIAGNOSIS — Z7901 Long term (current) use of anticoagulants: Secondary | ICD-10-CM | POA: Insufficient documentation

## 2023-04-01 DIAGNOSIS — M7989 Other specified soft tissue disorders: Secondary | ICD-10-CM | POA: Insufficient documentation

## 2023-04-01 LAB — CBC
HCT: 34.1 % — ABNORMAL LOW (ref 39.0–52.0)
Hemoglobin: 10.8 g/dL — ABNORMAL LOW (ref 13.0–17.0)
MCH: 29.5 pg (ref 26.0–34.0)
MCHC: 31.7 g/dL (ref 30.0–36.0)
MCV: 93.2 fL (ref 80.0–100.0)
Platelets: 378 10*3/uL (ref 150–400)
RBC: 3.66 MIL/uL — ABNORMAL LOW (ref 4.22–5.81)
RDW: 13.7 % (ref 11.5–15.5)
WBC: 13.7 10*3/uL — ABNORMAL HIGH (ref 4.0–10.5)
nRBC: 0 % (ref 0.0–0.2)

## 2023-04-01 LAB — BASIC METABOLIC PANEL
Anion gap: 10 (ref 5–15)
BUN: 22 mg/dL — ABNORMAL HIGH (ref 6–20)
CO2: 18 mmol/L — ABNORMAL LOW (ref 22–32)
Calcium: 8.8 mg/dL — ABNORMAL LOW (ref 8.9–10.3)
Chloride: 108 mmol/L (ref 98–111)
Creatinine, Ser: 1.62 mg/dL — ABNORMAL HIGH (ref 0.61–1.24)
GFR, Estimated: 53 mL/min — ABNORMAL LOW (ref >60)
Glucose, Bld: 118 mg/dL — ABNORMAL HIGH (ref 70–99)
Potassium: 3.8 mmol/L (ref 3.5–5.1)
Sodium: 136 mmol/L (ref 135–145)

## 2023-04-01 MED ORDER — APIXABAN 5 MG PO TABS
10.0000 mg | ORAL_TABLET | Freq: Once | ORAL | Status: AC
  Administered 2023-04-01: 10 mg via ORAL
  Filled 2023-04-01: qty 2

## 2023-04-01 MED ORDER — APIXABAN (ELIQUIS) VTE STARTER PACK (10MG AND 5MG)
ORAL_TABLET | ORAL | 0 refills | Status: DC
  Filled 2023-04-01: qty 74, 30d supply, fill #0

## 2023-04-01 NOTE — ED Provider Notes (Signed)
[place] EMERGENCY DEPARTMENT AT [facility] Provider Note   CSN: [id number] Arrival date & time: 03/31/23  2345     History  Chief Complaint  Patient presents with   Leg Swelling    Raymond Bowen is a 47 y.o. male.  Patient presents the ER today secondary to chronic right leg swelling.  This has been present for at least a couple years.  Patient's had multiple ultrasounds that showed chronic DVTs but has been noncompliant with his Eliquis.  States that his doctor told him to come get an ultrasound so he came here last night.  Wonders why has not been done yet.  No chest pain or shortness of breath.  No significant change in the swelling.  States he seen vascular in the past without intervention.  No fevers or other symptoms        Home Medications Prior to Admission medications   Medication Sig Start Date End Date Taking? Authorizing Provider  APIXABAN Everlene Balls) VTE STARTER PACK (10MG  AND 5MG ) Take as directed on package: start with two-5mg  tablets twice daily for 7 days. On day 8, switch to one-5mg  tablet twice daily. 04/01/23  Yes Antwan Bribiesca, Barbara Cower, MD  furosemide (LASIX) 20 MG tablet Take 1 tablet (20 mg total) by mouth daily as needed. For leg swelling 03/18/23   Claiborne Rigg, NP  Misc. Devices MISC Colostomy bags. 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 03/18/23   Claiborne Rigg, NP      Allergies    Shellfish allergy, Biopatch protective disk-chg [chlorhexidine], and Vancomycin    Review of Systems   Review of Systems  Physical Exam Updated Vital Signs BP 112/79 (BP Location: Right Arm)   Pulse 83   Temp 98.1 F (36.7 C)   Resp 18   SpO2 100%  Physical Exam Vitals and nursing note reviewed.  Constitutional:      Appearance: He is well-developed.  HENT:     Head: Normocephalic and atraumatic.  Cardiovascular:     Rate and Rhythm:  Normal rate.  Pulmonary:     Effort: Pulmonary effort is normal. No respiratory distress.  Abdominal:     General: There is no distension.  Musculoskeletal:        General: Normal range of motion.     Cervical back: Normal range of motion.     Right lower leg: Edema present.  Neurological:     Mental Status: He is alert.     ED Results / Procedures / Treatments   Labs (all labs ordered are listed, but only abnormal results are displayed) Labs Reviewed  CBC - Abnormal; Notable for the following components:      Result Value   WBC 13.7 (*)    RBC 3.66 (*)    Hemoglobin 10.8 (*)    HCT 34.1 (*)    All other components within normal limits  BASIC METABOLIC PANEL - Abnormal; Notable for the following components:   CO2 18 (*)    Glucose, Bld 118 (*)    BUN 22 (*)    Creatinine, Ser 1.62 (*)    Calcium 8.8 (*)    GFR, Estimated 53 (*)    All other components within normal limits    EKG None  Radiology No results found.  Procedures Procedures    Medications Ordered in ED Medications  apixaban (ELIQUIS) tablet  10 mg (10 mg Oral Given 04/01/23 1308)    ED Course/ Medical Decision Making/ A&P                                 Medical Decision Making Amount and/or Complexity of Data Reviewed Labs: ordered.  Risk Prescription drug management.   Labs at or near baseline.  I am not seeing this patient previously however it seems that his leg is also at baseline over last couple years.  Reordered outpatient ultrasound.  Give a dose of Eliquis here and prescribe the same.  Discussed that it can take many months of being on anticoagulation for the swelling to start to improve and could take a year or longer for it to go away.  Suggested to open to his doctor about possible compression stockings and/or vascular consultation to see if there is any other interventions.  No signs or symptoms of PE.  Final Clinical Impression(s) / ED Diagnoses Final diagnoses:  Leg swelling     Rx / DC Orders ED Discharge Orders          Ordered    APIXABAN (ELIQUIS) VTE STARTER PACK (10MG  AND 5MG )       Note to Pharmacy: If starter pack unavailable, substitute with seventy-four 5 mg apixaban tabs following the above SIG directions.   04/01/23 0627    VAS [place] UPPER EXTREMITY VENOUS DUPLEX        04/01/23 6578              Marily Memos, MD 04/01/23 2304

## 2023-04-01 NOTE — ED Provider Notes (Signed)
[place] EMERGENCY DEPARTMENT AT [facility] Provider Note   CSN: [id number] Arrival date & time: 04/01/23  2258     History {Add pertinent medical, surgical, social history, OB history to HPI:1} Chief Complaint  Patient presents with   Leg Swelling    Raymond Bowen is a 47 y.o. male.  Presents to the emergency department for evaluation of pain and swelling of the right lower extremity.  He reports that symptoms developed approximately 2 weeks ago.       Home Medications Prior to Admission medications   Medication Sig Start Date End Date Taking? Authorizing Provider  APIXABAN Everlene Balls) VTE STARTER PACK (10MG  AND 5MG ) Take as directed on package: start with two-5mg  tablets twice daily for 7 days. On day 8, switch to one-5mg  tablet twice daily. 04/01/23   Mesner, Barbara Cower, MD  furosemide (LASIX) 20 MG tablet Take 1 tablet (20 mg total) by mouth daily as needed. For leg swelling 03/18/23   Claiborne Rigg, NP  Misc. Devices MISC Colostomy bags. 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 03/18/23   Claiborne Rigg, NP      Allergies    Shellfish allergy, Biopatch protective disk-chg [chlorhexidine], and Vancomycin    Review of Systems   Review of Systems  Physical Exam Updated Vital Signs BP (!) 120/95 (BP Location: Right Arm)   Pulse (!) 111   Temp 99.3 F (37.4 C) (Oral)   Resp 15   Ht 5\' 7"  (1.702 m)   Wt 82.6 kg   SpO2 100%   BMI 28.52 kg/m  Physical Exam Vitals and nursing note reviewed.  Constitutional:      General: He is not in acute distress.    Appearance: He is well-developed.  HENT:     Head: Normocephalic and atraumatic.     Mouth/Throat:     Mouth: Mucous membranes are moist.  Eyes:     General: Vision grossly intact. Gaze aligned appropriately.     Extraocular Movements: Extraocular movements intact.      Conjunctiva/sclera: Conjunctivae normal.  Cardiovascular:     Rate and Rhythm: Normal rate and regular rhythm.     Pulses:          Dorsalis pedis pulses are 1+ on the right side.     Heart sounds: Normal heart sounds, S1 normal and S2 normal. No murmur heard.    No friction rub. No gallop.  Pulmonary:     Effort: Pulmonary effort is normal. No respiratory distress.     Breath sounds: Normal breath sounds.  Abdominal:     Palpations: Abdomen is soft.     Tenderness: There is no abdominal tenderness. There is no guarding or rebound.     Hernia: No hernia is present.  Musculoskeletal:        General: No swelling.     Cervical back: Full passive range of motion without pain, normal range of motion and neck supple. No pain with movement, spinous process tenderness or muscular tenderness. Normal range of motion.     Right lower leg: Edema present.     Left lower leg: No edema.  Skin:    General: Skin is warm and dry.     Capillary Refill: Capillary refill takes less than 2 seconds.     Findings: No ecchymosis, erythema, lesion or wound.  Neurological:  Mental Status: He is alert and oriented to person, place, and time.     GCS: GCS eye subscore is 4. GCS verbal subscore is 5. GCS motor subscore is 6.     Cranial Nerves: Cranial nerves 2-12 are intact.     Sensory: Sensation is intact.     Motor: Motor function is intact. No weakness or abnormal muscle tone.     Coordination: Coordination is intact.  Psychiatric:        Mood and Affect: Mood normal.        Speech: Speech normal.        Behavior: Behavior normal.     ED Results / Procedures / Treatments   Labs (all labs ordered are listed, but only abnormal results are displayed) Labs Reviewed  CBC WITH DIFFERENTIAL/PLATELET  COMPREHENSIVE METABOLIC PANEL  D-DIMER, QUANTITATIVE    EKG None  Radiology No results found.  Procedures Procedures  {Document cardiac monitor, telemetry assessment procedure when  appropriate:1}  Medications Ordered in ED Medications - No data to display  ED Course/ Medical Decision Making/ A&P   {   Click here for ABCD2, HEART and other calculatorsREFRESH Note before signing :1}                              Medical Decision Making Amount and/or Complexity of Data Reviewed Labs: ordered.   ***  {Document critical care time when appropriate:1} {Document review of labs and clinical decision tools ie heart score, Chads2Vasc2 etc:1}  {Document your independent review of radiology images, and any outside records:1} {Document your discussion with family members, caretakers, and with consultants:1} {Document social determinants of health affecting pt's care:1} {Document your decision making why or why not admission, treatments were needed:1} Final Clinical Impression(s) / ED Diagnoses Final diagnoses:  None    Rx / DC Orders ED Discharge Orders     None

## 2023-04-01 NOTE — ED Triage Notes (Signed)
Pt reports with right leg swelling x 1 week. Pt states that he has a hx of blood clots.

## 2023-04-01 NOTE — ED Notes (Signed)
Pt provided with sandwich bag 

## 2023-04-02 ENCOUNTER — Telehealth: Payer: Self-pay

## 2023-04-02 ENCOUNTER — Ambulatory Visit (HOSPITAL_COMMUNITY): Admission: RE | Admit: 2023-04-02 | Payer: Medicaid Other | Source: Ambulatory Visit

## 2023-04-02 LAB — COMPREHENSIVE METABOLIC PANEL
ALT: 43 U/L (ref 0–44)
AST: 39 U/L (ref 15–41)
Albumin: 3.3 g/dL — ABNORMAL LOW (ref 3.5–5.0)
Alkaline Phosphatase: 107 U/L (ref 38–126)
Anion gap: 8 (ref 5–15)
BUN: 22 mg/dL — ABNORMAL HIGH (ref 6–20)
CO2: 16 mmol/L — ABNORMAL LOW (ref 22–32)
Calcium: 8.9 mg/dL (ref 8.9–10.3)
Chloride: 110 mmol/L (ref 98–111)
Creatinine, Ser: 1.39 mg/dL — ABNORMAL HIGH (ref 0.61–1.24)
GFR, Estimated: >60 mL/min (ref >60)
Glucose, Bld: 83 mg/dL (ref 70–99)
Potassium: 4.3 mmol/L (ref 3.5–5.1)
Sodium: 134 mmol/L — ABNORMAL LOW (ref 135–145)
Total Bilirubin: 0.7 mg/dL (ref 0.3–1.2)
Total Protein: 8 g/dL (ref 6.5–8.1)

## 2023-04-02 LAB — D-DIMER, QUANTITATIVE: D-Dimer, Quant: 0.49 ug{FEU}/mL (ref 0.00–0.50)

## 2023-04-02 LAB — CBC WITH DIFFERENTIAL/PLATELET
Abs Immature Granulocytes: 0.06 10*3/uL (ref 0.00–0.07)
Basophils Absolute: 0 10*3/uL (ref 0.0–0.1)
Basophils Relative: 0 %
Eosinophils Absolute: 0.1 10*3/uL (ref 0.0–0.5)
Eosinophils Relative: 1 %
HCT: 35.1 % — ABNORMAL LOW (ref 39.0–52.0)
Hemoglobin: 10.8 g/dL — ABNORMAL LOW (ref 13.0–17.0)
Immature Granulocytes: 0 %
Lymphocytes Relative: 16 %
Lymphs Abs: 2.2 10*3/uL (ref 0.7–4.0)
MCH: 30.3 pg (ref 26.0–34.0)
MCHC: 30.8 g/dL (ref 30.0–36.0)
MCV: 98.3 fL (ref 80.0–100.0)
Monocytes Absolute: 1.4 10*3/uL — ABNORMAL HIGH (ref 0.1–1.0)
Monocytes Relative: 9 %
Neutro Abs: 10.6 10*3/uL — ABNORMAL HIGH (ref 1.7–7.7)
Neutrophils Relative %: 74 %
Platelets: 351 10*3/uL (ref 150–400)
RBC: 3.57 MIL/uL — ABNORMAL LOW (ref 4.22–5.81)
RDW: 13.7 % (ref 11.5–15.5)
WBC: 14.4 10*3/uL — ABNORMAL HIGH (ref 4.0–10.5)
nRBC: 0 % (ref 0.0–0.2)

## 2023-04-02 NOTE — Telephone Encounter (Signed)
The patient came to clinic this afternoon and said he missed his ultrasound today because he did not know where to go.  I called [facility] and spoke to Nicole Cella about re-scheduling his ultrasound.  I gave him the options to have it done tomorrow at 1000 at [facility] or 1600 at [facility].  He chose the appointment at [facility].  I gave him a printout of the appointment reminder as well as bus passes to get to and from the appointment. I stressed the importance of having this study done and he said he understands because it is very painful to walk.   He was seen in the ED 9/9 and 04/01/2023 for this same complaint.

## 2023-04-03 ENCOUNTER — Ambulatory Visit (HOSPITAL_COMMUNITY)
Admission: RE | Admit: 2023-04-03 | Discharge: 2023-04-03 | Disposition: A | Discharge status: Home / Self Care | Payer: Medicaid Other | Source: Ambulatory Visit | Attending: Emergency Medicine | Admitting: Emergency Medicine

## 2023-04-03 DIAGNOSIS — M7989 Other specified soft tissue disorders: Secondary | ICD-10-CM | POA: Diagnosis not present

## 2023-04-03 DIAGNOSIS — I82509 Chronic embolism and thrombosis of unspecified deep veins of unspecified lower extremity: Secondary | ICD-10-CM | POA: Insufficient documentation

## 2023-04-07 ENCOUNTER — Other Ambulatory Visit: Payer: Self-pay | Admitting: Pharmacist

## 2023-04-07 ENCOUNTER — Ambulatory Visit: Payer: Medicaid Other | Attending: Family Medicine | Admitting: Family Medicine

## 2023-04-07 ENCOUNTER — Other Ambulatory Visit: Payer: Self-pay

## 2023-04-07 ENCOUNTER — Encounter: Payer: Self-pay | Admitting: Vascular Surgery

## 2023-04-07 ENCOUNTER — Emergency Department (HOSPITAL_COMMUNITY): Payer: Medicaid Other

## 2023-04-07 ENCOUNTER — Encounter: Payer: Self-pay | Admitting: Family Medicine

## 2023-04-07 ENCOUNTER — Telehealth: Payer: Self-pay

## 2023-04-07 ENCOUNTER — Emergency Department (HOSPITAL_COMMUNITY)
Admission: EM | Admit: 2023-04-07 | Discharge: 2023-04-07 | Disposition: A | Discharge status: Home / Self Care | Payer: Medicaid Other | Attending: Emergency Medicine | Admitting: Emergency Medicine

## 2023-04-07 ENCOUNTER — Encounter (HOSPITAL_COMMUNITY): Payer: Self-pay

## 2023-04-07 ENCOUNTER — Other Ambulatory Visit (HOSPITAL_COMMUNITY): Payer: Self-pay

## 2023-04-07 VITALS — BP 125/77 | HR 71 | Ht 67.0 in | Wt 177.6 lb

## 2023-04-07 DIAGNOSIS — N189 Chronic kidney disease, unspecified: Secondary | ICD-10-CM | POA: Insufficient documentation

## 2023-04-07 DIAGNOSIS — Z5900 Homelessness unspecified: Secondary | ICD-10-CM | POA: Insufficient documentation

## 2023-04-07 DIAGNOSIS — I872 Venous insufficiency (chronic) (peripheral): Secondary | ICD-10-CM | POA: Diagnosis not present

## 2023-04-07 DIAGNOSIS — R2241 Localized swelling, mass and lump, right lower limb: Secondary | ICD-10-CM | POA: Diagnosis present

## 2023-04-07 DIAGNOSIS — M79604 Pain in right leg: Secondary | ICD-10-CM | POA: Diagnosis present

## 2023-04-07 DIAGNOSIS — D72829 Elevated white blood cell count, unspecified: Secondary | ICD-10-CM | POA: Diagnosis not present

## 2023-04-07 DIAGNOSIS — Z9889 Other specified postprocedural states: Secondary | ICD-10-CM | POA: Insufficient documentation

## 2023-04-07 DIAGNOSIS — I82511 Chronic embolism and thrombosis of right femoral vein: Secondary | ICD-10-CM

## 2023-04-07 DIAGNOSIS — Z7901 Long term (current) use of anticoagulants: Secondary | ICD-10-CM | POA: Diagnosis not present

## 2023-04-07 DIAGNOSIS — R262 Difficulty in walking, not elsewhere classified: Secondary | ICD-10-CM | POA: Insufficient documentation

## 2023-04-07 DIAGNOSIS — M792 Neuralgia and neuritis, unspecified: Secondary | ICD-10-CM | POA: Diagnosis not present

## 2023-04-07 DIAGNOSIS — N183 Chronic kidney disease, stage 3 unspecified: Secondary | ICD-10-CM | POA: Diagnosis not present

## 2023-04-07 DIAGNOSIS — Z932 Ileostomy status: Secondary | ICD-10-CM | POA: Insufficient documentation

## 2023-04-07 DIAGNOSIS — F17210 Nicotine dependence, cigarettes, uncomplicated: Secondary | ICD-10-CM | POA: Diagnosis not present

## 2023-04-07 DIAGNOSIS — R600 Localized edema: Secondary | ICD-10-CM | POA: Insufficient documentation

## 2023-04-07 DIAGNOSIS — Z79899 Other long term (current) drug therapy: Secondary | ICD-10-CM | POA: Diagnosis not present

## 2023-04-07 DIAGNOSIS — M7989 Other specified soft tissue disorders: Secondary | ICD-10-CM | POA: Diagnosis not present

## 2023-04-07 DIAGNOSIS — I82531 Chronic embolism and thrombosis of right popliteal vein: Secondary | ICD-10-CM | POA: Insufficient documentation

## 2023-04-07 LAB — BASIC METABOLIC PANEL
Anion gap: 7 (ref 5–15)
BUN: 16 mg/dL (ref 6–20)
CO2: 19 mmol/L — ABNORMAL LOW (ref 22–32)
Calcium: 8.6 mg/dL — ABNORMAL LOW (ref 8.9–10.3)
Chloride: 110 mmol/L (ref 98–111)
Creatinine, Ser: 1.29 mg/dL — ABNORMAL HIGH (ref 0.61–1.24)
GFR, Estimated: >60 mL/min (ref >60)
Glucose, Bld: 86 mg/dL (ref 70–99)
Potassium: 3.8 mmol/L (ref 3.5–5.1)
Sodium: 136 mmol/L (ref 135–145)

## 2023-04-07 LAB — CBC
HCT: 33.4 % — ABNORMAL LOW (ref 39.0–52.0)
Hemoglobin: 10.2 g/dL — ABNORMAL LOW (ref 13.0–17.0)
MCH: 29.1 pg (ref 26.0–34.0)
MCHC: 30.5 g/dL (ref 30.0–36.0)
MCV: 95.4 fL (ref 80.0–100.0)
Platelets: 335 10*3/uL (ref 150–400)
RBC: 3.5 MIL/uL — ABNORMAL LOW (ref 4.22–5.81)
RDW: 13.3 % (ref 11.5–15.5)
WBC: 10.8 10*3/uL — ABNORMAL HIGH (ref 4.0–10.5)
nRBC: 0 % (ref 0.0–0.2)

## 2023-04-07 MED ORDER — DULOXETINE HCL 60 MG PO CPEP
60.0000 mg | ORAL_CAPSULE | Freq: Every day | ORAL | 3 refills | Status: AC
Start: 2023-04-07 — End: ?
  Filled 2023-04-07: qty 30, 30d supply, fill #0
  Filled 2023-05-01: qty 30, 30d supply, fill #1
  Filled 2023-07-24: qty 30, 30d supply, fill #2

## 2023-04-07 MED ORDER — FENTANYL CITRATE PF 50 MCG/ML IJ SOSY
50.0000 ug | PREFILLED_SYRINGE | Freq: Once | INTRAMUSCULAR | Status: AC
  Administered 2023-04-07: 50 ug via INTRAVENOUS
  Filled 2023-04-07: qty 1

## 2023-04-07 MED ORDER — APIXABAN 5 MG PO TABS
5.0000 mg | ORAL_TABLET | Freq: Two times a day (BID) | ORAL | 2 refills | Status: DC
  Filled 2023-04-07: qty 60, 30d supply, fill #0

## 2023-04-07 MED ORDER — PREGABALIN 100 MG PO CAPS
100.0000 mg | ORAL_CAPSULE | Freq: Two times a day (BID) | ORAL | 3 refills | Status: AC
  Filled 2023-04-07 – 2023-05-01 (×2): qty 60, 30d supply, fill #0
  Filled 2023-07-24: qty 60, 30d supply, fill #1

## 2023-04-07 MED ORDER — APIXABAN 5 MG PO TABS
10.0000 mg | ORAL_TABLET | Freq: Two times a day (BID) | ORAL | 0 refills | Status: DC
  Filled 2023-04-07: qty 28, 7d supply, fill #0

## 2023-04-07 MED ORDER — CEPHALEXIN 500 MG PO CAPS
500.0000 mg | ORAL_CAPSULE | Freq: Two times a day (BID) | ORAL | 0 refills | Status: DC
Start: 2023-04-07 — End: 2023-05-18
  Filled 2023-04-07: qty 14, 7d supply, fill #0

## 2023-04-07 MED ORDER — IOHEXOL 350 MG/ML SOLN
100.0000 mL | Freq: Once | INTRAVENOUS | Status: AC | PRN
  Administered 2023-04-07: 100 mL via INTRAVENOUS

## 2023-04-07 MED ORDER — APIXABAN 5 MG PO TABS
ORAL_TABLET | ORAL | 0 refills | Status: DC
  Filled 2023-04-07: qty 180, 83d supply, fill #0

## 2023-04-07 MED ORDER — ELIQUIS DVT/PE STARTER PACK 5 MG PO TBPK
ORAL_TABLET | ORAL | 0 refills | Status: DC
  Filled 2023-04-07: qty 74, 30d supply, fill #0

## 2023-04-07 MED ORDER — FUROSEMIDE 20 MG PO TABS
20.0000 mg | ORAL_TABLET | Freq: Every day | ORAL | 1 refills | Status: DC | PRN
  Filled 2023-04-07: qty 90, 90d supply, fill #0

## 2023-04-07 MED ORDER — APIXABAN 5 MG PO TABS
10.0000 mg | ORAL_TABLET | Freq: Once | ORAL | Status: AC
  Administered 2023-04-07: 10 mg via ORAL
  Filled 2023-04-07: qty 2

## 2023-04-07 NOTE — Telephone Encounter (Signed)
The patient came to the clinic today when he left the ED.  He had not picked up his eliquis because it was not at the [facility] at [facility].  [facility] Ausdall, [facility] was able to transfer the rx to the pharmacy here at [facility].    He then requested I make the follow up appointment for his with [facility].  I spoke to Kendra/ [facility] and she was able to schedule him with Dr Randie Heinz for 05/14/2023.  I provided the patient with a printed appointment reminder.   I provided him with food from our clinic food pantry as well as some additional ostomy supplies and bus passes.   He then asked for an appointment with Dr [name] due to his RLE pain and edema.  Dr [name] was able to see him this afternoon.

## 2023-04-07 NOTE — ED Triage Notes (Signed)
Pt came in via POV d/t his Rt leg being swollen for the past 3 weeks, states it progressively keeps getting bigger & rates his pain 10/10. States he has seen a vascular provider & was informed he had a blood clot, not on thinners.

## 2023-04-07 NOTE — ED Triage Notes (Signed)
Pt was stuck twice. Wasn't successful.

## 2023-04-07 NOTE — ED Notes (Signed)
Vascular at bedside

## 2023-04-07 NOTE — Discharge Instructions (Addendum)
Please follow-up outpatient with Vascular surgery for further management of your chronic DVT. You need to restart anticoagulation. You have been given an initial dose of Eliquis here.  Information on my medicine - ELIQUIS (apixaban)  This medication education was reviewed with me or my healthcare representative as part of my discharge preparation.  The pharmacist that spoke with me during my hospital stay was:   Why was Eliquis prescribed for you? Eliquis was prescribed to treat blood clots that may have been found in the veins of your legs (deep vein thrombosis) or in your lungs (pulmonary embolism) and to reduce the risk of them occurring again.  What do You need to know about Eliquis ? The starting dose is 10 mg (two 5 mg tablets) taken TWICE daily for the FIRST SEVEN (7) DAYS, then on (enter date)  04/15/2023  the dose is reduced to ONE 5 mg tablet taken TWICE daily.  Eliquis may be taken with or without food.   Try to take the dose about the same time in the morning and in the evening. If you have difficulty swallowing the tablet whole please discuss with your pharmacist how to take the medication safely.  Take Eliquis exactly as prescribed and DO NOT stop taking Eliquis without talking to the doctor who prescribed the medication.  Stopping may increase your risk of developing a new blood clot.  Refill your prescription before you run out.  After discharge, you should have regular check-up appointments with your healthcare provider that is prescribing your Eliquis.    What do you do if you miss a dose? If a dose of ELIQUIS is not taken at the scheduled time, take it as soon as possible on the same day and twice-daily administration should be resumed. The dose should not be doubled to make up for a missed dose.  Important Safety Information A possible side effect of Eliquis is bleeding. You should call your healthcare provider right away if you experience any of the  following: Bleeding from an injury or your nose that does not stop. Unusual colored urine (red or dark brown) or unusual colored stools (red or black). Unusual bruising for unknown reasons. A serious fall or if you hit your head (even if there is no bleeding).  Some medicines may interact with Eliquis and might increase your risk of bleeding or clotting while on Eliquis. To help avoid this, consult your healthcare provider or pharmacist prior to using any new prescription or non-prescription medications, including herbals, vitamins, non-steroidal anti-inflammatory drugs (NSAIDs) and supplements.  This website has more information on Eliquis (apixaban): http://www.eliquis.com/eliquis/home
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
## 2023-04-07 NOTE — Patient Instructions (Signed)
Edema  Edema is an abnormal buildup of fluids in the body tissues and under the skin. Swelling of the legs, feet, and ankles is a common symptom that becomes more likely as you get older. Swelling is also common in looser tissues, such as around the eyes. Pressing on the area may make a temporary dent in your skin (pitting edema). This fluid may also accumulate in your lungs (pulmonary edema). There are many possible causes of edema. Eating too much salt (sodium) and being on your feet or sitting for a long time can cause edema in your legs, feet, and ankles. Common causes of edema include: Certain medical conditions, such as heart failure, liver or kidney disease, and cancer. Weak leg blood vessels. An injury. Pregnancy. Medicines. Being obese. Low protein levels in the blood. Hot weather may make edema worse. Edema is usually painless. Your skin may look swollen or shiny. Follow these instructions at home: Medicines Take over-the-counter and prescription medicines only as told by your health care provider. Your health care provider may prescribe a medicine to help your body get rid of extra water (diuretic). Take this medicine if you are told to take it. Eating and drinking Eat a low-salt (low-sodium) diet to reduce fluid as told by your health care provider. Sometimes, eating less salt may reduce swelling. Depending on the cause of your swelling, you may need to limit how much fluid you drink (fluid restriction). General instructions Raise (elevate) the injured area above the level of your heart while you are sitting or lying down. Do not sit still or stand for long periods of time. Do not wear tight clothing. Do not wear garters on your upper legs. Exercise your legs to get your circulation going. This helps to move the fluid back into your blood vessels, and it may help the swelling go down. Wear compression stockings as told by your health care provider. These stockings help to prevent  blood clots and reduce swelling in your legs. It is important that these are the correct size. These stockings should be prescribed by your health care provider to prevent possible injuries. If elastic bandages or wraps are recommended, use them as told by your health care provider. Contact a health care provider if: Your edema does not get better with treatment. You have heart, liver, or kidney disease and have symptoms of edema. You have sudden and unexplained weight gain. Get help right away if: You develop shortness of breath or chest pain. You cannot breathe when you lie down. You develop pain, redness, or warmth in the swollen areas. You have heart, liver, or kidney disease and suddenly get edema. You have a fever and your symptoms suddenly get worse. These symptoms may be an emergency. Get help right away. Call 911. Do not wait to see if the symptoms will go away. Do not drive yourself to the hospital. Summary Edema is an abnormal buildup of fluids in the body tissues and under the skin. Eating too much salt (sodium)and being on your feet or sitting for a long time can cause edema in your legs, feet, and ankles. Raise (elevate) the injured area above the level of your heart while you are sitting or lying down. Follow your health care provider's instructions about diet and how much fluid you can drink. This information is not intended to replace advice given to you by your health care provider. Make sure you discuss any questions you have with your health care provider. Document Revised: 03/12/2021 Document  Reviewed: 03/12/2021 Elsevier Patient Education  2024 [employer].

## 2023-04-11 ENCOUNTER — Telehealth: Payer: Self-pay

## 2023-04-11 NOTE — Telephone Encounter (Signed)
  I spoke to [facility] at [facility] [phone] and placed a new order for supplies for the patient. I  confirmed with her that the supplies are to be sent to [facility] as the patient does not have a permanent address.  She said that is where they will be sent and she will submit the order for processing.

## 2023-04-14 NOTE — Telephone Encounter (Signed)
Supplies have been delivered to [facility]

## 2023-04-15 ENCOUNTER — Telehealth: Payer: Self-pay

## 2023-04-15 ENCOUNTER — Ambulatory Visit: Payer: Medicaid Other | Admitting: Family Medicine

## 2023-04-15 DIAGNOSIS — Z932 Ileostomy status: Secondary | ICD-10-CM

## 2023-04-15 NOTE — Telephone Encounter (Signed)
That patient came to the clinic today and picked up additional ostomy supplies.  I also provided him with food from the clinic food pantries as well as bus passes.   He then asked if he could be referred back to the surgeon because he would like to have the ostomy reversed. He said he wanted to be referred to " [facility]."  The surgeon he was referred to was in [place] -Karel Jarvis and [facility] provider was in [facility]. He was supposed to have a colonoscopy in [facility] but never followed through.    I called [facility] and they have an office in [place].  I'm not sure if they have a surgeon in [facility] who he would be able to see.  Transportation to [place] is difficult for him.    Dr [name], if you can place the referrals, Arna Medici may be able to check what the options are in [facility],

## 2023-04-17 NOTE — Telephone Encounter (Signed)
Referral has been placed. 

## 2023-04-17 NOTE — Telephone Encounter (Signed)
Noted.  Patient's phone is currently not working, so I am not able to notify him that the referral has been placed.

## 2023-04-18 ENCOUNTER — Telehealth: Payer: Self-pay | Admitting: Family Medicine

## 2023-04-18 NOTE — Telephone Encounter (Signed)
Lillia Abed from [place], called in to know if pt is at a pint where he can stop using Eloquis so he can have consultation regarding  surgery for illisotomy to be reversed.

## 2023-04-19 NOTE — Telephone Encounter (Signed)
Routing to PCP for review.

## 2023-04-21 NOTE — Telephone Encounter (Signed)
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 When surgery approaches he will need to stop 48 hours to surgery then restart after.

## 2023-04-22 ENCOUNTER — Telehealth: Payer: Self-pay | Admitting: Family Medicine

## 2023-04-22 ENCOUNTER — Telehealth: Payer: Self-pay

## 2023-04-22 NOTE — Telephone Encounter (Signed)
Copied from CRM [id number]. Topic: Referral - Question >> Apr 22, 2023  1:14 PM Phill Myron wrote: Please call Toniann Fail from [facility]   Question: Has Mr Allerton had his hernia repaired? If not then before Dr reverses the Ileostomy he would have to get that repaired.

## 2023-04-22 NOTE — Telephone Encounter (Signed)
The patient came to the clinic today to provide [place] with his new phone number: [phone].   He did not need ostomy supplies.  I provided him with food from our food pantry and bus passes. I also gave him the phone number for [facility] to call if he has not heard from them by the end of the week.

## 2023-04-22 NOTE — Telephone Encounter (Signed)
I spoke to [facility] and provided her with the patient's new phone number and informed her that this is the only number the patient has.

## 2023-04-23 NOTE — Telephone Encounter (Signed)
Toniann Fail was called and informed that I don't have that information, she states that they will not be able to see the patient until he has his hernia repaired. She has tried to reach out to the patient and number is disconnected.

## 2023-05-01 ENCOUNTER — Other Ambulatory Visit: Payer: Self-pay

## 2023-05-01 ENCOUNTER — Other Ambulatory Visit: Payer: Self-pay | Admitting: Family Medicine

## 2023-05-01 ENCOUNTER — Other Ambulatory Visit (HOSPITAL_COMMUNITY): Payer: Self-pay

## 2023-05-01 ENCOUNTER — Telehealth: Payer: Self-pay

## 2023-05-01 DIAGNOSIS — M7989 Other specified soft tissue disorders: Secondary | ICD-10-CM

## 2023-05-01 DIAGNOSIS — G8929 Other chronic pain: Secondary | ICD-10-CM | POA: Diagnosis not present

## 2023-05-01 DIAGNOSIS — T83198A Other mechanical complication of other urinary devices and implants, initial encounter: Secondary | ICD-10-CM | POA: Diagnosis not present

## 2023-05-01 MED ORDER — APIXABAN 5 MG PO TABS
5.0000 mg | ORAL_TABLET | Freq: Two times a day (BID) | ORAL | 2 refills | Status: DC
  Filled 2023-05-01: qty 60, 30d supply, fill #0

## 2023-05-01 MED ORDER — FUROSEMIDE 20 MG PO TABS
20.0000 mg | ORAL_TABLET | Freq: Every day | ORAL | 1 refills | Status: DC | PRN
  Filled 2023-05-01 – 2023-07-24 (×2): qty 90, 90d supply, fill #0
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
## 2023-05-12 ENCOUNTER — Other Ambulatory Visit (HOSPITAL_COMMUNITY): Payer: Self-pay

## 2023-05-14 ENCOUNTER — Encounter: Payer: Medicaid Other | Admitting: Vascular Surgery

## 2023-05-18 ENCOUNTER — Emergency Department (HOSPITAL_BASED_OUTPATIENT_CLINIC_OR_DEPARTMENT_OTHER): Payer: Medicaid Other

## 2023-05-18 ENCOUNTER — Emergency Department (HOSPITAL_COMMUNITY)
Admission: EM | Admit: 2023-05-18 | Discharge: 2023-05-18 | Disposition: A | Discharge status: Home / Self Care | Payer: Medicaid Other | Attending: Emergency Medicine | Admitting: Emergency Medicine

## 2023-05-18 ENCOUNTER — Other Ambulatory Visit: Payer: Self-pay

## 2023-05-18 ENCOUNTER — Emergency Department (HOSPITAL_COMMUNITY): Payer: Medicaid Other

## 2023-05-18 DIAGNOSIS — M79661 Pain in right lower leg: Secondary | ICD-10-CM

## 2023-05-18 DIAGNOSIS — I825Z1 Chronic embolism and thrombosis of unspecified deep veins of right distal lower extremity: Secondary | ICD-10-CM | POA: Diagnosis not present

## 2023-05-18 DIAGNOSIS — Z7901 Long term (current) use of anticoagulants: Secondary | ICD-10-CM | POA: Insufficient documentation

## 2023-05-18 DIAGNOSIS — M7989 Other specified soft tissue disorders: Secondary | ICD-10-CM | POA: Diagnosis present

## 2023-05-18 DIAGNOSIS — N189 Chronic kidney disease, unspecified: Secondary | ICD-10-CM | POA: Diagnosis not present

## 2023-05-18 LAB — CBC WITH DIFFERENTIAL/PLATELET
Abs Immature Granulocytes: 0.03 10*3/uL (ref 0.00–0.07)
Basophils Absolute: 0.1 10*3/uL (ref 0.0–0.1)
Basophils Relative: 0 %
Eosinophils Absolute: 0.3 10*3/uL (ref 0.0–0.5)
Eosinophils Relative: 2 %
HCT: 39.6 % (ref 39.0–52.0)
Hemoglobin: 12.6 g/dL — ABNORMAL LOW (ref 13.0–17.0)
Immature Granulocytes: 0 %
Lymphocytes Relative: 22 %
Lymphs Abs: 2.6 10*3/uL (ref 0.7–4.0)
MCH: 29.1 pg (ref 26.0–34.0)
MCHC: 31.8 g/dL (ref 30.0–36.0)
MCV: 91.5 fL (ref 80.0–100.0)
Monocytes Absolute: 1.3 10*3/uL — ABNORMAL HIGH (ref 0.1–1.0)
Monocytes Relative: 10 %
Neutro Abs: 7.8 10*3/uL — ABNORMAL HIGH (ref 1.7–7.7)
Neutrophils Relative %: 66 %
Platelets: 218 10*3/uL (ref 150–400)
RBC: 4.33 MIL/uL (ref 4.22–5.81)
RDW: 13.9 % (ref 11.5–15.5)
WBC: 12 10*3/uL — ABNORMAL HIGH (ref 4.0–10.5)
nRBC: 0 % (ref 0.0–0.2)

## 2023-05-18 LAB — PROTIME-INR
INR: 1 (ref 0.8–1.2)
Prothrombin Time: 13.6 s (ref 11.4–15.2)

## 2023-05-18 LAB — BASIC METABOLIC PANEL
Anion gap: 9 (ref 5–15)
BUN: 29 mg/dL — ABNORMAL HIGH (ref 6–20)
CO2: 20 mmol/L — ABNORMAL LOW (ref 22–32)
Calcium: 8.6 mg/dL — ABNORMAL LOW (ref 8.9–10.3)
Chloride: 109 mmol/L (ref 98–111)
Creatinine, Ser: 1.54 mg/dL — ABNORMAL HIGH (ref 0.61–1.24)
GFR, Estimated: 56 mL/min — ABNORMAL LOW (ref >60)
Glucose, Bld: 95 mg/dL (ref 70–99)
Potassium: 3.8 mmol/L (ref 3.5–5.1)
Sodium: 138 mmol/L (ref 135–145)

## 2023-05-18 MED ORDER — FENTANYL CITRATE PF 50 MCG/ML IJ SOSY
50.0000 ug | PREFILLED_SYRINGE | Freq: Once | INTRAMUSCULAR | Status: AC
  Administered 2023-05-18: 50 ug via INTRAVENOUS
  Filled 2023-05-18: qty 1

## 2023-05-18 MED ORDER — APIXABAN 5 MG PO TABS
5.0000 mg | ORAL_TABLET | Freq: Two times a day (BID) | ORAL | 2 refills | Status: DC
  Filled 2023-05-18 – 2023-07-24 (×2): qty 60, 30d supply, fill #0

## 2023-05-18 MED ORDER — APIXABAN 5 MG PO TABS
10.0000 mg | ORAL_TABLET | Freq: Once | ORAL | Status: AC
  Administered 2023-05-18: 10 mg via ORAL
  Filled 2023-05-18: qty 2

## 2023-05-18 NOTE — ED Notes (Signed)
US at bedside
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
## 2023-05-18 NOTE — Progress Notes (Addendum)
VASCULAR LAB    Right lower extremity venous duplex has been performed.  See CV proc for preliminary results.  Messaged results to Evlyn Kanner, PA-C via secure chat  Sherren Kerns, RVT 05/18/2023, 10:06 AM\

## 2023-05-18 NOTE — ED Triage Notes (Addendum)
Pt here with significant R sided leg swelling and pain. States he noticed the pain and swelling today. Denies chest pain or shob. Denies taking blood thinner.

## 2023-05-18 NOTE — Discharge Instructions (Addendum)
Please follow-up with the vascular specialist I have attached here for you today.  Today your ultrasound shows that you do have a chronic DVT with no new features and your reassuring lab work.  You are given 1 dose of Eliquis here but will need to take the rest of the medication as prescribed.  It is very important they take this medication to prevent any progression of the DVT.  The vascular specialist should call you in the next few days to schedule an appointment in the next week or 2 so they can be seen sooner than your current appointment.  If symptoms change or worsen please return to the ER.

## 2023-05-19 ENCOUNTER — Other Ambulatory Visit (HOSPITAL_COMMUNITY): Payer: Self-pay

## 2023-05-19 ENCOUNTER — Other Ambulatory Visit: Payer: Self-pay

## 2023-05-29 ENCOUNTER — Other Ambulatory Visit (HOSPITAL_COMMUNITY): Payer: Self-pay

## 2023-06-04 ENCOUNTER — Encounter: Payer: Medicaid Other | Admitting: Vascular Surgery

## 2023-06-05 ENCOUNTER — Telehealth: Payer: Self-pay

## 2023-06-05 NOTE — Telephone Encounter (Signed)
I called the patient to for wellness check as he has not picked up  ostomy supplies in a month.  He said he is fine and was planning to come today but did not have a ride, so he will try to come to the clinic tomorrow.  We have some supplies in the clinic for him but I called Kayla at [facility] [phone]  to place an order for additional supplies. Message was left with call back requested.

## 2023-06-06 ENCOUNTER — Telehealth: Payer: Self-pay | Admitting: Family Medicine

## 2023-06-06 NOTE — Telephone Encounter (Signed)
Pt is calling to report that he is on the way to the office, Pt reports that he will pick up his bus pass, and food. Pt reports that he will be to the office in 20 minutes

## 2023-06-06 NOTE — Telephone Encounter (Signed)
I spoke to [employer] and placed an order for additional supplies.  I confirmed that the delivery address is [facility].  She stated that they would try to get the order out today.

## 2023-06-06 NOTE — Telephone Encounter (Signed)
Patient arrived at the clinic today ostomy supplies, food and bus passes given.

## 2023-06-09 NOTE — Telephone Encounter (Signed)
Shipment of supplies received but it was missing the skin barrier with hydrocolloid flexible collar, : [id number]  I called [employer] and spoke with Jon Gills about the missing item. She said it must have been an error that it was not sent with the order. She said she will re-order and have it sent out.

## 2023-06-10 NOTE — Telephone Encounter (Signed)
2 boxes of skin barrier with hydrocolloid received today.

## 2023-06-16 ENCOUNTER — Telehealth: Payer: Self-pay

## 2023-06-16 NOTE — Telephone Encounter (Signed)
Patient came to the clinic today and picked up ostomy supplies as well as some food from the food pantry and bus passes.  He said he is doing well,no concerns at this time

## 2023-06-25 ENCOUNTER — Encounter: Payer: Medicaid Other | Admitting: Vascular Surgery

## 2023-06-30 ENCOUNTER — Telehealth: Payer: Self-pay

## 2023-06-30 NOTE — Telephone Encounter (Signed)
The patient came to the clinic and picked up ostomy supplies,some food from the clinic food pantry and bus passes.  He had no complaints/ concerns at this time.

## 2023-07-06 ENCOUNTER — Emergency Department (HOSPITAL_COMMUNITY)
Admission: EM | Admit: 2023-07-06 | Discharge: 2023-07-06 | Disposition: A | Discharge status: Home / Self Care | Payer: Medicaid Other | Attending: Emergency Medicine | Admitting: Emergency Medicine

## 2023-07-06 ENCOUNTER — Other Ambulatory Visit: Payer: Self-pay

## 2023-07-06 DIAGNOSIS — Z7901 Long term (current) use of anticoagulants: Secondary | ICD-10-CM | POA: Diagnosis not present

## 2023-07-06 DIAGNOSIS — N189 Chronic kidney disease, unspecified: Secondary | ICD-10-CM | POA: Diagnosis not present

## 2023-07-06 DIAGNOSIS — Z86718 Personal history of other venous thrombosis and embolism: Secondary | ICD-10-CM | POA: Insufficient documentation

## 2023-07-06 DIAGNOSIS — M79604 Pain in right leg: Secondary | ICD-10-CM | POA: Insufficient documentation

## 2023-07-06 MED ORDER — ACETAMINOPHEN 500 MG PO TABS
500.0000 mg | ORAL_TABLET | Freq: Four times a day (QID) | ORAL | 0 refills | Status: AC | PRN
Start: 2023-07-06 — End: ?
  Filled 2023-07-06: qty 30, 8d supply, fill #0

## 2023-07-06 MED ORDER — ACETAMINOPHEN 500 MG PO TABS
1000.0000 mg | ORAL_TABLET | Freq: Once | ORAL | Status: AC
  Administered 2023-07-06: 1000 mg via ORAL
  Filled 2023-07-06: qty 2
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
## 2023-07-06 NOTE — Discharge Instructions (Signed)
Please continue to take your fluid medication as well as your blood thinner medication as management of your leg pain and swelling.

## 2023-07-06 NOTE — ED Triage Notes (Signed)
Pt BIB [facility] from home. Pt C/o right leg pain that started earlier today. Pt reports pain 10/10. Pt also c/o cough that has been going on for 1 week. Pt reports having fluid buildup in leg. Pt takes eliquis and lasix.  178 palpated 69HR 95% 97CBG

## 2023-07-07 ENCOUNTER — Other Ambulatory Visit (HOSPITAL_COMMUNITY): Payer: Self-pay

## 2023-07-08 ENCOUNTER — Encounter (HOSPITAL_COMMUNITY): Payer: Self-pay | Admitting: Emergency Medicine

## 2023-07-08 ENCOUNTER — Emergency Department (HOSPITAL_COMMUNITY)
Admission: EM | Admit: 2023-07-08 | Discharge: 2023-07-08 | Disposition: A | Discharge status: Home / Self Care | Payer: Medicaid Other | Attending: Emergency Medicine | Admitting: Emergency Medicine

## 2023-07-08 ENCOUNTER — Other Ambulatory Visit: Payer: Self-pay

## 2023-07-08 DIAGNOSIS — Z7901 Long term (current) use of anticoagulants: Secondary | ICD-10-CM | POA: Diagnosis not present

## 2023-07-08 DIAGNOSIS — K9400 Colostomy complication, unspecified: Secondary | ICD-10-CM | POA: Diagnosis not present

## 2023-07-08 DIAGNOSIS — R109 Unspecified abdominal pain: Secondary | ICD-10-CM | POA: Diagnosis present

## 2023-07-08 DIAGNOSIS — Z433 Encounter for attention to colostomy: Secondary | ICD-10-CM

## 2023-07-08 DIAGNOSIS — D72829 Elevated white blood cell count, unspecified: Secondary | ICD-10-CM | POA: Diagnosis not present

## 2023-07-08 DIAGNOSIS — I10 Essential (primary) hypertension: Secondary | ICD-10-CM | POA: Diagnosis not present

## 2023-07-08 LAB — CBC WITH DIFFERENTIAL/PLATELET
Abs Immature Granulocytes: 0.09 10*3/uL — ABNORMAL HIGH (ref 0.00–0.07)
Basophils Absolute: 0.1 10*3/uL (ref 0.0–0.1)
Basophils Relative: 1 %
Eosinophils Absolute: 0.3 10*3/uL (ref 0.0–0.5)
Eosinophils Relative: 2 %
HCT: 42.1 % (ref 39.0–52.0)
Hemoglobin: 13.6 g/dL (ref 13.0–17.0)
Immature Granulocytes: 1 %
Lymphocytes Relative: 21 %
Lymphs Abs: 3.5 10*3/uL (ref 0.7–4.0)
MCH: 28.3 pg (ref 26.0–34.0)
MCHC: 32.3 g/dL (ref 30.0–36.0)
MCV: 87.7 fL (ref 80.0–100.0)
Monocytes Absolute: 1.6 10*3/uL — ABNORMAL HIGH (ref 0.1–1.0)
Monocytes Relative: 10 %
Neutro Abs: 10.9 10*3/uL — ABNORMAL HIGH (ref 1.7–7.7)
Neutrophils Relative %: 65 %
Platelets: 249 10*3/uL (ref 150–400)
RBC: 4.8 MIL/uL (ref 4.22–5.81)
RDW: 16.3 % — ABNORMAL HIGH (ref 11.5–15.5)
WBC: 16.5 10*3/uL — ABNORMAL HIGH (ref 4.0–10.5)
nRBC: 0 % (ref 0.0–0.2)

## 2023-07-08 LAB — COMPREHENSIVE METABOLIC PANEL
ALT: 29 U/L (ref 0–44)
AST: 41 U/L (ref 15–41)
Albumin: 3.6 g/dL (ref 3.5–5.0)
Alkaline Phosphatase: 97 U/L (ref 38–126)
Anion gap: 9 (ref 5–15)
BUN: 22 mg/dL — ABNORMAL HIGH (ref 6–20)
CO2: 18 mmol/L — ABNORMAL LOW (ref 22–32)
Calcium: 8.6 mg/dL — ABNORMAL LOW (ref 8.9–10.3)
Chloride: 110 mmol/L (ref 98–111)
Creatinine, Ser: 1.69 mg/dL — ABNORMAL HIGH (ref 0.61–1.24)
GFR, Estimated: 50 mL/min — ABNORMAL LOW (ref >60)
Glucose, Bld: 100 mg/dL — ABNORMAL HIGH (ref 70–99)
Potassium: 3.6 mmol/L (ref 3.5–5.1)
Sodium: 137 mmol/L (ref 135–145)
Total Bilirubin: 0.6 mg/dL (ref <1.2)
Total Protein: 7.7 g/dL (ref 6.5–8.1)

## 2023-07-08 NOTE — ED Triage Notes (Signed)
Pt in from home via [facility] with R sided abdominal pain and bursted colostomy bag. Pt also states he has known blood clot to RLE, states compliance with Eliquis VS en route: 136/86 88HR 96%RA

## 2023-07-08 NOTE — ED Provider Triage Note (Signed)
Emergency Medicine Provider Triage Evaluation Note  Raymond Bowen , a 47 y.o. male  was evaluated in triage.  Pt complains of colostomy problems.  Review of Systems  Positive: Abdominal pain Negative: fever  Physical Exam  BP (!) 182/104   Pulse 99   Temp 97.9 F (36.6 C) (Oral)   Resp 20   Wt 80.6 kg   SpO2 100%   BMI 27.83 kg/m  Gen:   Awake, no distress  anxious Resp:  Normal effort  MSK:   Moves extremities without difficulty  Other:    Medical Decision Making  Medically screening exam initiated at 3:32 AM.  Appropriate orders placed.  ASKARI ANSARI was informed that the remainder of the evaluation will be completed by another provider, this initial triage assessment does not replace that evaluation, and the importance of remaining in the ED until their evaluation is complete.  Will provide colostomy bag.  Will check labs.  Pt has been seen multiple previously, will defer imaging for now.     [name], MD 07/08/23 [phone]

## 2023-07-08 NOTE — ED Notes (Signed)
Colostomy bags ordered for pt, ok to wait in lobby per Dr. Bebe Shaggy

## 2023-07-08 NOTE — ED Notes (Signed)
Alert, NAD, calm, interactive, resps e/u. Verbalizes "have colostomy bag", denies pain or other sx or needs. Mentions hunger. Given food and drink.

## 2023-07-08 NOTE — Consult Note (Addendum)
WOC Nurse ostomy consult note Consult requested for ostomy supplies. Performed remotely after review of progress notes and message to bedside nurse via Secure Chat while patient was in the ED.   Pt is familiar to [facility] team from previous admissions.  He is independent with pouch application and emptying prior to admission and ostomy has been present several years.  Bedside nurse states they already ordered supplies for the patient at this time and they do not require further assistance.  Orders provided for future reference:  Use Supplies: barrier ring [employer] # [id number] and pouches [employer] # [phone] Please re-consult if further assistance is needed.  Thank-you,  Cammie Mcgee MSN, RN, CWOCN, [place], CNS [phone]

## 2023-07-08 NOTE — ED Provider Notes (Signed)
[place] EMERGENCY DEPARTMENT AT [facility] Provider Note   CSN: [id number] Arrival date & time: 07/08/23  0319     History  Chief Complaint  Patient presents with   Colostomy Bag Issue    Raymond Bowen is a 47 y.o. male.  Patient here for new ostomy bag.  He states that his ostomy bag ripped and needed a new one.  He is having some abdominal pain but no longer having any pain now.  He has a history of blood clots he is on Eliquis.  He has no complaints of my evaluation.  He states he is ready to go home.  Denies any fever or chills.  No nausea vomiting.  He is having normal output from his ostomy.  He states that he has ostomy bags at home but when it ruptured he was closer to the Bowen wanted to come get 1 here.  He denies any fever or chills.  The history is provided by the patient.       Home Medications Prior to Admission medications   Medication Sig Start Date End Date Taking? Authorizing Provider  acetaminophen (TYLENOL) 500 MG tablet Take 1 tablet (500 mg total) by mouth every 6 (six) hours as needed. 07/06/23   Fayrene Helper, PA-C  apixaban (ELIQUIS) 5 MG TABS tablet Take 1 tablet (5 mg total) by mouth 2 (two) times daily. 05/18/23   Netta Corrigan, PA-C  DULoxetine (CYMBALTA) 60 MG capsule Take 1 capsule (60 mg total) by mouth daily. 04/07/23   Hoy Register, MD  furosemide (LASIX) 20 MG tablet Take 1 tablet (20 mg total) by mouth daily as needed. For leg swelling Patient taking differently: Take 20 mg by mouth daily as needed (leg swelling). 05/01/23   Hoy Register, MD  Misc. Devices MISC Colostomy bags. 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 04/07/23   Hoy Register, MD      Allergies    Shellfish allergy, Biopatch protective disk-chg [chlorhexidine], and Vancomycin    Review of Systems   Review of Systems  Physical  Exam Updated Vital Signs BP (!) 154/98   Pulse 82   Temp 98.2 F (36.8 C)   Resp 16   Wt 80.6 kg   SpO2 99%   BMI 27.83 kg/m  Physical Exam Vitals and nursing note reviewed.  Constitutional:      General: He is not in acute distress.    Appearance: He is well-developed.  HENT:     Head: Normocephalic and atraumatic.  Eyes:     Conjunctiva/sclera: Conjunctivae normal.  Cardiovascular:     Rate and Rhythm: Normal rate and regular rhythm.     Heart sounds: No murmur heard. Pulmonary:     Effort: Pulmonary effort is normal. No respiratory distress.     Breath sounds: Normal breath sounds.  Abdominal:     General: There is no distension.     Palpations: Abdomen is soft.     Tenderness: There is no abdominal tenderness.     Comments: No tenderness on abdominal exam, ostomy bag is in place  Musculoskeletal:        General: No swelling.     Cervical back: Neck supple.  Skin:    General: Skin is warm and dry.     Capillary Refill: Capillary refill takes less than 2 seconds.  Neurological:  Mental Status: He is alert.  Psychiatric:        Mood and Affect: Mood normal.     ED Results / Procedures / Treatments   Labs (all labs ordered are listed, but only abnormal results are displayed) Labs Reviewed  CBC WITH DIFFERENTIAL/PLATELET - Abnormal; Notable for the following components:      Result Value   WBC 16.5 (*)    RDW 16.3 (*)    Neutro Abs 10.9 (*)    Monocytes Absolute 1.6 (*)    Abs Immature Granulocytes 0.09 (*)    All other components within normal limits  COMPREHENSIVE METABOLIC PANEL - Abnormal; Notable for the following components:   CO2 18 (*)    Glucose, Bld 100 (*)    BUN 22 (*)    Creatinine, Ser 1.69 (*)    Calcium 8.6 (*)    GFR, Estimated 50 (*)    All other components within normal limits    EKG None  Radiology No results found.  Procedures Procedures    Medications Ordered in ED Medications - No data to display  ED Course/  Medical Decision Making/ A&P                                 Medical Decision Making  Raymond Bowen is here for new ostomy bag.  Patient with normal vitals.  Well-appearing.  No abdominal tenderness on exam.  Already had blood work done prior to my evaluation with a CBC and CMP which were unremarkable.  Mild white count of 16.5 but only somewhat has an elevated white count.  He has no fever.  He has no tenderness.  He wants to go home and feels much better.  He gets somewhat chronic abdominal pain.  He has normal output from his ostomy.  He has brown stool.  His ostomy bag has been replaced.  He has extra ostomy bags at home.  He has follow-up with primary care doctor which she gets his bags and supplies from.  Overall have no concern for other acute medical problem.  Patient felt better and wanted to be discharged which I think is reasonable and I do not think he needs a CT scan or any further workup at this time.  Discharged in good condition.  Understands return precautions.  This chart was dictated using voice recognition software.  Despite best efforts to proofread,  errors can occur which can change the documentation meaning.         Final Clinical Impression(s) / ED Diagnoses Final diagnoses:  Colostomy care [facility])  Abdominal pain, unspecified abdominal location    Rx / DC Orders ED Discharge Orders     None         Virgina Norfolk, DO 07/08/23 1204

## 2023-07-08 NOTE — ED Notes (Signed)
Per Dr. Bebe Shaggy pt can go to lobby after labs which were done.

## 2023-07-09 ENCOUNTER — Telehealth: Payer: Self-pay

## 2023-07-09 NOTE — Telephone Encounter (Signed)
Open in error

## 2023-07-09 NOTE — Telephone Encounter (Signed)
Late entry: 07/08/2023  Patient in the office today. Ostomy supplies, Bus passes and some shelf food given.

## 2023-07-10 ENCOUNTER — Telehealth: Payer: Self-pay

## 2023-07-10 NOTE — Telephone Encounter (Signed)
I spoke to Raymond Bowen, [employer] and placed an order for ostomy supplies.  She confirmed the address where the supplies will be sent: [facility].  She said they would get the order ready today.

## 2023-07-15 NOTE — Telephone Encounter (Signed)
Ostomy supply order received 07/14/2023.

## 2023-07-17 ENCOUNTER — Other Ambulatory Visit (HOSPITAL_COMMUNITY): Payer: Self-pay

## 2023-07-19 ENCOUNTER — Emergency Department (HOSPITAL_COMMUNITY)
Admission: EM | Admit: 2023-07-19 | Discharge: 2023-07-19 | Disposition: A | Discharge status: Home / Self Care | Payer: Medicaid Other | Attending: Emergency Medicine | Admitting: Emergency Medicine

## 2023-07-19 ENCOUNTER — Other Ambulatory Visit: Payer: Self-pay

## 2023-07-19 DIAGNOSIS — M7989 Other specified soft tissue disorders: Secondary | ICD-10-CM

## 2023-07-19 DIAGNOSIS — F17210 Nicotine dependence, cigarettes, uncomplicated: Secondary | ICD-10-CM | POA: Diagnosis not present

## 2023-07-19 DIAGNOSIS — Z7901 Long term (current) use of anticoagulants: Secondary | ICD-10-CM | POA: Insufficient documentation

## 2023-07-19 DIAGNOSIS — R2241 Localized swelling, mass and lump, right lower limb: Secondary | ICD-10-CM | POA: Insufficient documentation

## 2023-07-19 DIAGNOSIS — N189 Chronic kidney disease, unspecified: Secondary | ICD-10-CM | POA: Diagnosis not present

## 2023-07-19 MED ORDER — OXYCODONE HCL 5 MG PO TABS
5.0000 mg | ORAL_TABLET | Freq: Once | ORAL | Status: AC
  Administered 2023-07-19: 5 mg via ORAL
  Filled 2023-07-19: qty 1
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
## 2023-07-19 NOTE — ED Triage Notes (Addendum)
Pt BIB [facility] from home d/t right leg swelling.  Pt is on Eliquis from a previous DVT - Pt has had 12 he stated>  He says Eliquis is not working and wants something done about it. This is ongoing but got really bad this morning.

## 2023-07-19 NOTE — Discharge Instructions (Addendum)
You were evaluated in the Emergency Department and after careful evaluation, we did not find any emergent condition requiring admission or further testing in the hospital.  Your exam/testing today is overall reassuring.  Call the vascular surgeon to review discussed your case.  Continue your home medications.  Please return to the Emergency Department if you experience any worsening of your condition.   Thank you for allowing [place] to be a part of your care.

## 2023-07-24 ENCOUNTER — Emergency Department (HOSPITAL_COMMUNITY)
Admission: EM | Admit: 2023-07-24 | Discharge: 2023-07-24 | Discharge status: Against Medical Advice | Payer: Medicaid Other | Attending: Emergency Medicine | Admitting: Emergency Medicine

## 2023-07-24 ENCOUNTER — Other Ambulatory Visit: Payer: Self-pay

## 2023-07-24 ENCOUNTER — Telehealth: Payer: Self-pay

## 2023-07-24 DIAGNOSIS — K9409 Other complications of colostomy: Secondary | ICD-10-CM | POA: Diagnosis present

## 2023-07-24 DIAGNOSIS — Z5321 Procedure and treatment not carried out due to patient leaving prior to being seen by health care provider: Secondary | ICD-10-CM | POA: Diagnosis not present

## 2023-07-24 NOTE — ED Notes (Signed)
 Pt left after receiving colostomy supplies.

## 2023-07-24 NOTE — ED Triage Notes (Addendum)
 Pt BIB EMS, reports busted colostomy bag this morning, pt states a litte pain at site but needs supplies. Please see pt care plan r/t additional colostomy supplies.  Pt becomes irate during triage, refused to sign MSE states  I done this 50 million times I'm just here for a damn colostomy bag.

## 2023-07-24 NOTE — Telephone Encounter (Signed)
 Patient came to the clinic and was given his ostomy supplies, food from the food pantry and bus passes.  He said he is planning to go to [facility] this week and is considering moving there

## 2023-07-24 NOTE — ED Notes (Signed)
 One colostomy bag given to pt and cut at 1in per pt request

## 2023-07-24 NOTE — ED Notes (Signed)
 Pt doesn't want to see provider. Pt said he is leaving. Pt labels were given to this tech.

## 2023-08-04 ENCOUNTER — Other Ambulatory Visit (HOSPITAL_COMMUNITY): Payer: Self-pay

## 2023-12-29 ENCOUNTER — Telehealth: Payer: Self-pay | Admitting: Family Medicine

## 2023-12-29 NOTE — Telephone Encounter (Signed)
 I called the patient back and he said he is back from [place]  and needs ostomy supplies. I told him that I have his supplies in the office and he said he is going to try to come by today to pick them up

## 2023-12-29 NOTE — Telephone Encounter (Signed)
 Copied from CRM [id number]. Topic: General - Other >> Dec 29, 2023  9:08 AM Baldemar Lev wrote: Reason for CRM: Pt called requesting to speak with Jerryl Morin. Regarding his colonoscopy bags. Says he does not have any. Urgent.   Best contact: [phone]

## 2023-12-31 ENCOUNTER — Telehealth: Payer: Self-pay | Admitting: Family Medicine

## 2023-12-31 NOTE — Telephone Encounter (Signed)
 Copied from CRM [id number]. Topic: General - Other >> Dec 29, 2023  9:08 AM Baldemar Lev wrote:  Reason for CRM: Pt called requesting to speak with Jerryl Morin. Regarding his colonoscopy bags. Says he does not have any. Urgent.   Best contact: [phone]  >> Dec 31, 2023  3:04 PM Emylou G wrote: Patient called.Aaron Aas adv Carroll Lingelbach Jr.will come by and pick up his bags.. he said it should be one box each

## 2023-12-31 NOTE — Telephone Encounter (Signed)
 Patient called to say he can't get to [facility] this afternoon.  He plans to have is son pick the supplies up but is not sure if he will be at [facility] before 1730.  He asked if I could leave the supplies outside and I said no, as someone may pick them up and discard them. I told him that I will be at the office until 1830 and he can call me when his son is here and I will get the supplies to him because the office building will be closed.  He said he will try to get in contact with his son.  I asked him how he was getting his supplies when he was in [facility] and he said he was going to the hospital there to get them.

## 2023-12-31 NOTE — Telephone Encounter (Signed)
 The supplies are in the clinic, patient can come at anytime during clinic hours and pick them up

## 2023-12-31 NOTE — Telephone Encounter (Signed)
 Patient came to the clinic and picked up 10 ostomy bags and duradhesive skin barriers with collar.  I explained to him that I have 10 more bags left from the last order and he will need to see Branda Cain, FNP before I can order any more supplies.  The last prescription was written over a year ago.  He said he understood and asked that I schedule that appointment for him as well as one with Dr Newlin.  I told him that I will schedule those and then contact him.  I updated his phone number and also encouraged him to call [facility] about the ostomy reversal and he said he will because he really wants it closed,

## 2024-01-01 NOTE — Telephone Encounter (Signed)
 I spoke to Raymond Bowen/ [facility] and scheduled the patient for 6/17/ 2025 @ 1230. I also scheduled him with Dr Newlin 01/19/2024 @ 9147.  I tried to reach him to inform him of the appointments and the importance of keeping them and I had to leave a message requesting a call back.

## 2024-01-05 NOTE — Telephone Encounter (Signed)
 Signed referral faxed to [facility].   I tried to reach the patient again to inform him of the appointment tomorrow at the ostomy clinic and I had to leave a message requesting a call back.

## 2024-01-06 ENCOUNTER — Inpatient Hospital Stay (HOSPITAL_COMMUNITY): Admission: RE | Admit: 2024-01-06 | Source: Ambulatory Visit | Admitting: Nurse Practitioner

## 2024-01-07 NOTE — Telephone Encounter (Signed)
 The patient missed his appointment at the [facility] yesterday and I tried to reach him today to instruct him to call and reschedule that appointment as we will be needing new orders and I had to leave a message requesting a call back.  I also want to inform him of his appointment with Dr Newlin on 01/19/2024.

## 2024-01-12 NOTE — Telephone Encounter (Signed)
 I tried to reach him today to remind him of his appointment with Dr Delbert 01/19/2024 and also to instruct him to call and reschedule the appointment at the [facility] that he missed last week. We will need new orders for ostomy supplies. The recording stated that the call cannot be completed at this time

## 2024-01-16 ENCOUNTER — Telehealth: Payer: Self-pay

## 2024-01-16 NOTE — Telephone Encounter (Signed)
 Patient in the office today to pick up ostomy supplies. 5 bags and  5 wafer given to patient. Patient voiced that this was not enough. Because they are not sticking good. Asked patient if  had he gone to see the ostomy nurse since being back in [place] , Patient said no that Slater , CM said she would get him an appointment with her. Patient has appointment In the office on Monday 06/30/ 2024. Advised patient that at we would call to get him appointment with her then. Patient also given ostomy belt, paper tape and food from the food pantry.

## 2024-01-19 ENCOUNTER — Ambulatory Visit: Admitting: Family Medicine

## 2024-01-20 ENCOUNTER — Ambulatory Visit: Admitting: Family Medicine

## 2024-01-20 NOTE — Telephone Encounter (Signed)
 Noted.  I made patient an appointment at the [facility] for 01/06/2024 but was unable to reach him to provide information about the appointment

## 2024-01-21 ENCOUNTER — Ambulatory Visit: Payer: Self-pay | Admitting: Nurse Practitioner

## 2024-01-26 ENCOUNTER — Telehealth: Payer: Self-pay

## 2024-01-26 NOTE — Telephone Encounter (Signed)
 Patient came to the clinic and picked  up the last 5 ostomy bags that I have for him. He was accompanied by his wife, Raymond Bowen.  He reports that the bags are leaking and he also needs an ostomy belt, stoma adhesive paste and paper tape.  We do not have paste or ostomy belts but I was able to give him some paper tape as well as some kerlix to use as a belt to secure the ostomy bag.  I reminded him that I do not have any more ostomy bags for him .  I explained to both he and his wife that it is imperative that he sees [name] Cooley, FNP/[facility] to be re-assessed for the proper ostomy supplies and  a new order is needed for the  DME supplier.  It has been > 1.5 years since a prescription for the supplies was submitted to [facility]. I gave he and his wife  the phone number for the ostomy clinic and instructed him to call and schedule an appointment as soon as possible. He was calling the ostomy clinic as he was leaving this clinic.  Raymond Bowen said she would make sure he schedules that appointment.  I told him that he can have the supplies delivered to his home instead of to this clinic but he said he wants the supplies to be delivered here to [facility].  Raymond Bowen said she is planning to assist him with getting an appointment with the surgeon to have the ostomy closed.   I provided him with bus passes to get home today as well as to get to the ostomy clinic appointment and  I gave Jessinia the phone number for [employer] transportation to call for rides to medical appointments.   I also provided him with some food from the clinic food pantry.

## 2024-01-26 NOTE — Telephone Encounter (Signed)
 The patient called stating he will be coming to the clinic this afternoon to pick up some ostomy supplies.  I reminded him that the supply he has is very limited and [facility]  will need a new order for supplies because it has been over 1.5 years since they received a prescription for the supplies. I told him that he needs to call [name] Cooley, FNP/ Ostomy Clinic to schedule an appointment.  He asked that I give him her number when he comes to pick up his supplies.

## 2024-01-26 NOTE — Telephone Encounter (Signed)
 Patient returning call, Call transferred to [facility].

## 2024-06-16 ENCOUNTER — Telehealth: Payer: Self-pay
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
## 2024-06-16 NOTE — Telephone Encounter (Signed)
 I received another call from the patient stating he spoke to someone at the [facility] but no one called him back to tell him the supplies are ready for pick up.  I told him I would check with [name].  I contacted [name] Cooley, FNP and she said the supplies are ready for patient to pick up at the [facility].  I called the patient back and told him the supplies are ready for pick up. He was very adult nurse.  I updated his address in Epic and he said future supplies can be delivered there: [address], [place], [address].  I also provided him with the number for the  [facility] provider that he was supposed to see : [phone].

## 2024-06-23 ENCOUNTER — Emergency Department (HOSPITAL_COMMUNITY): Payer: Self-pay

## 2024-06-23 ENCOUNTER — Encounter (HOSPITAL_COMMUNITY): Payer: Self-pay

## 2024-06-23 ENCOUNTER — Other Ambulatory Visit: Payer: Self-pay

## 2024-06-23 ENCOUNTER — Emergency Department (HOSPITAL_COMMUNITY)
Admission: EM | Admit: 2024-06-23 | Discharge: 2024-06-23 | Disposition: A | Discharge status: Home / Self Care | Payer: Self-pay | Attending: Emergency Medicine | Admitting: Emergency Medicine

## 2024-06-23 ENCOUNTER — Other Ambulatory Visit (HOSPITAL_COMMUNITY): Payer: Self-pay

## 2024-06-23 DIAGNOSIS — M7989 Other specified soft tissue disorders: Secondary | ICD-10-CM

## 2024-06-23 DIAGNOSIS — R52 Pain, unspecified: Secondary | ICD-10-CM

## 2024-06-23 DIAGNOSIS — N2889 Other specified disorders of kidney and ureter: Secondary | ICD-10-CM

## 2024-06-23 DIAGNOSIS — R600 Localized edema: Secondary | ICD-10-CM

## 2024-06-23 LAB — CBC WITH DIFFERENTIAL/PLATELET
Abs Immature Granulocytes: 0.04 K/uL (ref 0.00–0.07)
Basophils Absolute: 0 K/uL (ref 0.0–0.1)
Basophils Relative: 0 %
Eosinophils Absolute: 0.2 K/uL (ref 0.0–0.5)
Eosinophils Relative: 1 %
HCT: 34.8 % — ABNORMAL LOW (ref 39.0–52.0)
Hemoglobin: 11.1 g/dL — ABNORMAL LOW (ref 13.0–17.0)
Immature Granulocytes: 0 %
Lymphocytes Relative: 16 %
Lymphs Abs: 1.8 K/uL (ref 0.7–4.0)
MCH: 30.4 pg (ref 26.0–34.0)
MCHC: 31.9 g/dL (ref 30.0–36.0)
MCV: 95.3 fL (ref 80.0–100.0)
Monocytes Absolute: 0.9 K/uL (ref 0.1–1.0)
Monocytes Relative: 8 %
Neutro Abs: 8.2 K/uL — ABNORMAL HIGH (ref 1.7–7.7)
Neutrophils Relative %: 75 %
Platelets: 240 K/uL (ref 150–400)
RBC: 3.65 MIL/uL — ABNORMAL LOW (ref 4.22–5.81)
RDW: 13.4 % (ref 11.5–15.5)
WBC: 11.1 K/uL — ABNORMAL HIGH (ref 4.0–10.5)
nRBC: 0 % (ref 0.0–0.2)

## 2024-06-23 LAB — BASIC METABOLIC PANEL WITH GFR
Anion gap: 8 (ref 5–15)
BUN: 39 mg/dL — ABNORMAL HIGH (ref 6–20)
CO2: 20 mmol/L — ABNORMAL LOW (ref 22–32)
Calcium: 8.4 mg/dL — ABNORMAL LOW (ref 8.9–10.3)
Chloride: 108 mmol/L (ref 98–111)
Creatinine, Ser: 1.96 mg/dL — ABNORMAL HIGH (ref 0.61–1.24)
GFR, Estimated: 42 mL/min — ABNORMAL LOW (ref >60)
Glucose, Bld: 84 mg/dL (ref 70–99)
Potassium: 3.6 mmol/L (ref 3.5–5.1)
Sodium: 136 mmol/L (ref 135–145)

## 2024-06-23 MED ORDER — FUROSEMIDE 20 MG PO TABS
20.0000 mg | ORAL_TABLET | Freq: Every day | ORAL | 0 refills | Status: AC | PRN
  Filled 2024-06-23: qty 30, 30d supply, fill #0

## 2024-06-23 MED ORDER — APIXABAN (ELIQUIS) VTE STARTER PACK (10MG AND 5MG)
ORAL_TABLET | ORAL | 0 refills | Status: AC
  Filled 2024-06-23: qty 74, 30d supply, fill #0

## 2024-06-23 NOTE — Discharge Instructions (Addendum)
 Schedule follow-up with the wellness clinic.  I have restarted your Eliquis  and your Lasix .  You need to see your primary care physician for recheck.  Return to the emergency department if any problems

## 2024-06-23 NOTE — ED Notes (Signed)
 Pt provided with food and drink with discharge paper work. He then asked for urinal and bathroom to empty his colostomy bag.

## 2024-06-23 NOTE — ED Notes (Signed)
Patient transported to Vascular 

## 2024-06-23 NOTE — ED Triage Notes (Signed)
 Pt to er, pt states that he is here for R leg swelling and pain, pt states that he has a hx of dvt in his R leg.  Pt states that he also has abd pain.  Pt ambulatory to hallway 20 with out assistance and slight limp.

## 2024-06-23 NOTE — ED Notes (Signed)
 Patient transported to CT

## 2024-06-23 NOTE — Progress Notes (Signed)
 Right lower ext venous  has been completed. Refer to [facility] under chart review to view preliminary results.   06/23/2024  12:32 PM [name]

## 2024-06-23 NOTE — ED Notes (Signed)
 ED PA at bedside
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
## 2024-06-25 NOTE — Telephone Encounter (Signed)
 Copied from CRM #[id number]. Topic: General - Other >> Jun 25, 2024  1:39 PM Charlet HERO wrote:  Reason for CRM: Patient is calling to speak with Slater he would like for her to call him back he is stating that he is out of colostomy bags.

## 2024-06-25 NOTE — Telephone Encounter (Signed)
 noted

## 2024-06-25 NOTE — Telephone Encounter (Signed)
 Patient is calling to report that he was on the way up to the office to receive food & bus passes. Please advise

## 2024-06-28 ENCOUNTER — Telehealth: Payer: Self-pay | Admitting: Family Medicine

## 2024-06-28 ENCOUNTER — Inpatient Hospital Stay (HOSPITAL_COMMUNITY): Admission: RE | Admit: 2024-06-28 | Payer: Self-pay | Admitting: Nurse Practitioner

## 2024-06-28 ENCOUNTER — Ambulatory Visit (HOSPITAL_COMMUNITY): Payer: Self-pay | Admitting: Nurse Practitioner

## 2024-06-28 NOTE — Telephone Encounter (Signed)
 Late entry: 06/25/2024  Pt came in the office requesting supplies, bus passes and a bag of food. Also sch appt with Dr.Wright on Dec 9th at 10:50am

## 2024-06-28 NOTE — Telephone Encounter (Signed)
 Patient has an appointment at [facility] tomorrow.  This clinic does not supply ostomy bags.
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
## 2024-06-29 ENCOUNTER — Ambulatory Visit: Admitting: Critical Care Medicine

## 2024-06-29 DIAGNOSIS — N2889 Other specified disorders of kidney and ureter: Secondary | ICD-10-CM | POA: Insufficient documentation

## 2024-07-01 ENCOUNTER — Telehealth: Payer: Self-pay

## 2024-07-01 NOTE — Telephone Encounter (Addendum)
 Slater,  Patient called today regarding ostomy bags. Advised patient to contact [name] at the [facility] for supplies. Provided the Ostomy Clinic phone number [phone] and instructed to ask for supplies.

## 2024-07-01 NOTE — Telephone Encounter (Signed)
 I have forwarded this to Dr. Newlin his PCP.

## 2024-07-01 NOTE — Telephone Encounter (Signed)
 I called patient and he said he spoke to [name] at the [facility] and they are closed now and she would not be able to get him any supplies until tomorrow. He stated that hs is currently using duct tape to hold on his bag. He said he may have to go to the ED.  He called there and asked if he could get the ostomy bag and not have to wait for hours. He said they told him that they should be able to get him a bag in triage.  I told him that we do not have any ostomy bags in this clinic and he said he understood.   I explained to him that it is very important that he schedule an appointment to see [name] to be re-assessed for the appropriate type/ size of ostomy bag and I emphasized the importance of keeping an appointment with her.  Again he said he understood.

## 2024-07-01 NOTE — Telephone Encounter (Signed)
 Copied from CRM #[id number]. Topic: Clinical - Medication Question >> Jul 01, 2024  9:55 AM Ivette P wrote:  Reason for CRM: Pt called in about the Ostomy Bags, pt says he wants to speak to Slater about it. Called [place] and spoke to Zara to get number where pt can get Ostomy bags and provided number to Ostomy clinic and pt says it just goes to voicemail. Advsied would need to contact [name] and pt would still like a follow up call from jane.    [name] - [phone] [facility] and ask for supplies.

## 2024-07-05 ENCOUNTER — Other Ambulatory Visit (HOSPITAL_COMMUNITY): Payer: Self-pay

## 2024-07-12 NOTE — Telephone Encounter (Signed)
>>   Jul 12, 2024  1:26 PM [facility] wrote:  Patient called and stated he was out of his Ostomy Bags and that Slater knew how to get in touch with [name] at [facility]. I did advise the patient that office was closed for lunch and they will return at 1:30pm. He is requesting a call back from [place].   [phone]

## 2024-07-14 NOTE — Telephone Encounter (Signed)
 I spoke to the patient and he said he is all set. He stated that he was able to reach [facility], FNP and she gave him some new ostomy bags.  He stated they are different than the ones he had been using and so far they are working well.  He had no other concerns/questions at this time

## 2024-07-26 ENCOUNTER — Emergency Department (HOSPITAL_COMMUNITY)
Admission: EM | Admit: 2024-07-26 | Discharge: 2024-07-26 | Disposition: A | Discharge status: Home / Self Care | Payer: Self-pay | Attending: Emergency Medicine | Admitting: Emergency Medicine

## 2024-07-26 ENCOUNTER — Encounter (HOSPITAL_COMMUNITY): Payer: Self-pay

## 2024-07-26 DIAGNOSIS — Z710 Person encountering health services to consult on behalf of another person: Secondary | ICD-10-CM | POA: Insufficient documentation

## 2024-07-26 DIAGNOSIS — Z711 Person with feared health complaint in whom no diagnosis is made: Secondary | ICD-10-CM

## 2024-07-26 DIAGNOSIS — Z7901 Long term (current) use of anticoagulants: Secondary | ICD-10-CM | POA: Insufficient documentation

## 2024-07-26 NOTE — ED Provider Notes (Signed)
 " [place] EMERGENCY DEPARTMENT AT [facility] Provider Note   CSN: [id number] Arrival date & time: 07/26/24  9257     Patient presents with: No chief complaint on file.   Raymond Bowen is a 49 y.o. male.   HPI   This patient presents with the need for a ostomy bag is the only one that he has burst.  He states he was supposed to pick some up at 11:00 this morning but it has been leaking all morning onto his body.  No other complaints  Prior to Admission medications  Medication Sig Start Date End Date Taking? Authorizing Provider  acetaminophen  (TYLENOL ) 500 MG tablet Take 1 tablet (500 mg total) by mouth every 6 (six) hours as needed. 07/06/23   Nivia Colon, PA-C  APIXABAN  (ELIQUIS ) VTE STARTER PACK (10MG  AND 5MG ) Take as directed on package: start with two-5mg  tablets twice daily for 7 days. On day 8, switch to one-5mg  tablet twice daily. 06/23/24   Sofia, Leslie K, PA-C  DULoxetine  (CYMBALTA ) 60 MG capsule Take 1 capsule (60 mg total) by mouth daily. 04/07/23   Newlin, Enobong, MD  furosemide  (LASIX ) 20 MG tablet Take 1 tablet (20 mg total) by mouth daily as needed (leg swelling). 06/23/24   [place] Sonny MARLA, PA-C  Misc. Devices MISC Colostomy bags. 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 05/10/24   [provider]  Ostomy Supplies (SUR-FIT NATURA DURAHESIVE) [place] as directed. 05/10/24   [provider]  pregabalin  (LYRICA ) 100 MG capsule Take 1 capsule (100 mg total) by mouth 2 (two) times daily. 04/07/23   Newlin, Enobong, MD    Allergies: Shellfish allergy, Biopatch protective disk-chg [chlorhexidine ], and Vancomycin     Review of Systems  All other systems reviewed and are negative.   Updated Vital Signs BP (!) 150/115   Pulse 90   Temp 98.2 F (36.8 C)   Resp 20   SpO2 100%   Physical Exam Vitals and nursing note reviewed.  Constitutional:       Appearance: He is well-developed. He is not diaphoretic.  HENT:     Head: Normocephalic and atraumatic.  Eyes:     General:        Right eye: No discharge.        Left eye: No discharge.     Conjunctiva/sclera: Conjunctivae normal.  Pulmonary:     Effort: Pulmonary effort is normal. No respiratory distress.  Abdominal:     Comments: No obvious hernia of the ostomy, draining appropriately, new ostomy bag placed  Skin:    General: Skin is warm and dry.     Findings: No erythema or rash.  Neurological:     Mental Status: He is alert.     Coordination: Coordination normal.     (all labs ordered are listed, but only abnormal results are displayed) Labs Reviewed - No data to display  EKG: None  Radiology: No results found.   Procedures   Medications Ordered in the ED - No data to display                                  Medical Decision Making  Patient given new ostomy bag no other concerns, stable for discharge     Final diagnoses:  Worried well    ED Discharge Orders  None          Raymond Rogue, MD 07/26/24 [phone]  "

## 2024-07-26 NOTE — Discharge Instructions (Signed)
 Please pick up your ostomy bags at 11:00 as scheduled

## 2024-07-26 NOTE — ED Triage Notes (Signed)
 Pt reports his colostomy bag busted and he ran out of colostomy bags. C/o burning and itching at colostomy site.

## 2024-07-28 ENCOUNTER — Telehealth: Payer: Self-pay | Admitting: Family Medicine

## 2024-07-28 NOTE — Telephone Encounter (Signed)
 Per Idamae Gaskins, PAA, the patient came to the clinic and said he picked up his ostomy bags and she gave him a bus pass to get home as well as some food from the clinic food pantry.    I left him the phone numbers for [facility]:  [phone]  and [facility]: [phone]  which Idamae gave to him,

## 2024-07-28 NOTE — Telephone Encounter (Signed)
 Copied from CRM #[id number]. Topic: General - Other >> Jul 28, 2024  1:08 PM Montie POUR wrote:  Reason for CRM:  [name] is calling and would like for Slater to call him back at [phone]. He said he needed a number to a place in [facility] for surgery that Slater gave him. He does not remember the name. He needs a bus past and a lot of food. He has to meet [name] at the hospital to pick up his ostomy bags. Also, he wants to know if she has any Cheerios. He should be here in a hour.

## 2024-07-28 NOTE — Telephone Encounter (Signed)
 I called the patient back at the number provided and the person that answered said he was not there and he is on his way to the clinic

## 2024-08-24 ENCOUNTER — Telehealth: Payer: Self-pay

## 2024-08-24 NOTE — Telephone Encounter (Signed)
"   Met with the patient when he came to the clinic today.  He explained that he lost his insurance and disability when he was in prison and he is now trying to have them re-instated and he  needed a copy of his ID.   He was provided with a copy of his ID and he was given some food from our clinic food pantry.   Regarding his disability, he has has been in contact with an attorney and needs to re-apply.   Regarding the insurance, I encouraged him to speak to one of the DSS Medicaid Eligibility case workers on the 4th floor of this building to see if they are able to assist him. He said his ride is waiting for him but he will come back tomorrow.  I told him to please call me with any questions.   He said that [name] Cooley, FNP has been leaving ostomy supplies for him to pick up [facility] entrance.   "

## 2024-08-31 ENCOUNTER — Ambulatory Visit: Payer: Self-pay | Admitting: Family Medicine
